# Patient Record
Sex: Male | Born: 1945 | Race: Black or African American | Hispanic: No | Smoking: Former smoker
Health system: Southern US, Community
[De-identification: ages and names within clinical notes are randomized; demographics above are authoritative.]

## PROBLEM LIST (undated history)

## (undated) ENCOUNTER — Emergency Department (HOSPITAL_COMMUNITY): Admission: EM | Payer: Medicare Other | Source: Home / Self Care

## (undated) DIAGNOSIS — Z87891 Personal history of nicotine dependence: Secondary | ICD-10-CM

## (undated) DIAGNOSIS — M109 Gout, unspecified: Secondary | ICD-10-CM

## (undated) DIAGNOSIS — G4733 Obstructive sleep apnea (adult) (pediatric): Secondary | ICD-10-CM

## (undated) DIAGNOSIS — I2510 Atherosclerotic heart disease of native coronary artery without angina pectoris: Secondary | ICD-10-CM

## (undated) DIAGNOSIS — E1129 Type 2 diabetes mellitus with other diabetic kidney complication: Secondary | ICD-10-CM

## (undated) DIAGNOSIS — I10 Essential (primary) hypertension: Secondary | ICD-10-CM

## (undated) DIAGNOSIS — Z9989 Dependence on other enabling machines and devices: Secondary | ICD-10-CM

## (undated) DIAGNOSIS — I5032 Chronic diastolic (congestive) heart failure: Secondary | ICD-10-CM

## (undated) DIAGNOSIS — N183 Chronic kidney disease, stage 3 (moderate): Secondary | ICD-10-CM

## (undated) DIAGNOSIS — D649 Anemia, unspecified: Secondary | ICD-10-CM

## (undated) DIAGNOSIS — I272 Pulmonary hypertension, unspecified: Secondary | ICD-10-CM

## (undated) DIAGNOSIS — K5790 Diverticulosis of intestine, part unspecified, without perforation or abscess without bleeding: Secondary | ICD-10-CM

## (undated) HISTORY — DX: Type 2 diabetes mellitus with other diabetic kidney complication: E11.29

## (undated) HISTORY — DX: Gout, unspecified: M10.9

## (undated) HISTORY — DX: Personal history of nicotine dependence: Z87.891

## (undated) HISTORY — DX: Anemia, unspecified: D64.9

## (undated) HISTORY — DX: Obstructive sleep apnea (adult) (pediatric): G47.33

## (undated) HISTORY — DX: Essential (primary) hypertension: I10

## (undated) HISTORY — DX: Chronic diastolic (congestive) heart failure: I50.32

## (undated) HISTORY — DX: Dependence on other enabling machines and devices: Z99.89

## (undated) HISTORY — PX: CHOLECYSTECTOMY: SHX55

## (undated) HISTORY — DX: Chronic kidney disease, stage 3 (moderate): N18.3

## (undated) HISTORY — DX: Diverticulosis of intestine, part unspecified, without perforation or abscess without bleeding: K57.90

## (undated) HISTORY — DX: Pulmonary hypertension, unspecified: I27.20

---

## 1993-10-08 DIAGNOSIS — E1121 Type 2 diabetes mellitus with diabetic nephropathy: Secondary | ICD-10-CM

## 1993-10-08 DIAGNOSIS — E1129 Type 2 diabetes mellitus with other diabetic kidney complication: Secondary | ICD-10-CM

## 1993-10-08 HISTORY — DX: Type 2 diabetes mellitus with diabetic nephropathy: E11.21

## 2009-06-28 ENCOUNTER — Emergency Department (HOSPITAL_COMMUNITY): Admission: EM | Admit: 2009-06-28 | Discharge: 2009-06-28 | Payer: Self-pay | Admitting: Emergency Medicine

## 2009-07-07 ENCOUNTER — Emergency Department (HOSPITAL_COMMUNITY): Admission: EM | Admit: 2009-07-07 | Discharge: 2009-07-07 | Payer: Self-pay | Admitting: Emergency Medicine

## 2009-10-13 ENCOUNTER — Ambulatory Visit (HOSPITAL_COMMUNITY): Admission: RE | Admit: 2009-10-13 | Discharge: 2009-10-13 | Payer: Self-pay | Admitting: Gastroenterology

## 2010-09-26 LAB — GLUCOSE, CAPILLARY: Glucose-Capillary: 125 mg/dL — ABNORMAL HIGH (ref 70–99)

## 2010-10-08 LAB — BASIC METABOLIC PANEL
BUN: 20 mg/dL (ref 6–23)
CO2: 24 mEq/L (ref 19–32)
Calcium: 8.9 mg/dL (ref 8.4–10.5)
Chloride: 113 mEq/L — ABNORMAL HIGH (ref 96–112)
Creatinine, Ser: 1.79 mg/dL — ABNORMAL HIGH (ref 0.4–1.5)
GFR calc non Af Amer: 38 mL/min — ABNORMAL LOW (ref >60)
Glucose, Bld: 102 mg/dL — ABNORMAL HIGH (ref 70–99)
Potassium: 3.7 mEq/L (ref 3.5–5.1)
Sodium: 142 mEq/L (ref 135–145)

## 2010-10-08 LAB — CBC
HCT: 34.2 % — ABNORMAL LOW (ref 39.0–52.0)
HCT: 36.8 % — ABNORMAL LOW (ref 39.0–52.0)
Hemoglobin: 11.5 g/dL — ABNORMAL LOW (ref 13.0–17.0)
Hemoglobin: 12.2 g/dL — ABNORMAL LOW (ref 13.0–17.0)
MCHC: 33.1 g/dL (ref 30.0–36.0)
MCHC: 33.6 g/dL (ref 30.0–36.0)
MCV: 92.5 fL (ref 78.0–100.0)
MCV: 93.5 fL (ref 78.0–100.0)
Platelets: 188 10*3/uL (ref 150–400)
Platelets: 192 10*3/uL (ref 150–400)
RBC: 3.69 MIL/uL — ABNORMAL LOW (ref 4.22–5.81)
RBC: 3.94 MIL/uL — ABNORMAL LOW (ref 4.22–5.81)
RDW: 12.8 % (ref 11.5–15.5)
RDW: 13.1 % (ref 11.5–15.5)
WBC: 8.3 10*3/uL (ref 4.0–10.5)
WBC: 8.6 10*3/uL (ref 4.0–10.5)

## 2010-10-08 LAB — URINALYSIS, ROUTINE W REFLEX MICROSCOPIC
Bilirubin Urine: NEGATIVE
Bilirubin Urine: NEGATIVE
Glucose, UA: NEGATIVE mg/dL
Glucose, UA: NEGATIVE mg/dL
Hgb urine dipstick: NEGATIVE
Hgb urine dipstick: NEGATIVE
Ketones, ur: NEGATIVE mg/dL
Ketones, ur: NEGATIVE mg/dL
Nitrite: NEGATIVE
Nitrite: NEGATIVE
Protein, ur: NEGATIVE mg/dL
Protein, ur: NEGATIVE mg/dL
Specific Gravity, Urine: 1.017 (ref 1.005–1.030)
Specific Gravity, Urine: 1.021 (ref 1.005–1.030)
Urobilinogen, UA: 0.2 mg/dL (ref 0.0–1.0)
Urobilinogen, UA: 0.2 mg/dL (ref 0.0–1.0)
pH: 5 (ref 5.0–8.0)
pH: 5 (ref 5.0–8.0)

## 2010-10-08 LAB — COMPREHENSIVE METABOLIC PANEL
ALT: 11 U/L (ref 0–53)
AST: 21 U/L (ref 0–37)
Albumin: 4.1 g/dL (ref 3.5–5.2)
Alkaline Phosphatase: 41 U/L (ref 39–117)
BUN: 21 mg/dL (ref 6–23)
CO2: 26 mEq/L (ref 19–32)
Calcium: 9.7 mg/dL (ref 8.4–10.5)
Chloride: 107 mEq/L (ref 96–112)
Creatinine, Ser: 1.59 mg/dL — ABNORMAL HIGH (ref 0.4–1.5)
GFR calc non Af Amer: 44 mL/min — ABNORMAL LOW (ref >60)
Glucose, Bld: 95 mg/dL (ref 70–99)
Potassium: 4.4 mEq/L (ref 3.5–5.1)
Sodium: 139 mEq/L (ref 135–145)
Total Bilirubin: 0.6 mg/dL (ref 0.3–1.2)
Total Protein: 7.8 g/dL (ref 6.0–8.3)

## 2010-10-08 LAB — DIFFERENTIAL
Basophils Absolute: 0 10*3/uL (ref 0.0–0.1)
Basophils Absolute: 0.1 10*3/uL (ref 0.0–0.1)
Basophils Relative: 0 % (ref 0–1)
Basophils Relative: 1 % (ref 0–1)
Eosinophils Absolute: 0.1 10*3/uL (ref 0.0–0.7)
Eosinophils Absolute: 0.3 10*3/uL (ref 0.0–0.7)
Eosinophils Relative: 2 % (ref 0–5)
Eosinophils Relative: 3 % (ref 0–5)
Lymphocytes Relative: 21 % (ref 12–46)
Lymphocytes Relative: 25 % (ref 12–46)
Lymphs Abs: 1.8 10*3/uL (ref 0.7–4.0)
Lymphs Abs: 2 10*3/uL (ref 0.7–4.0)
Monocytes Absolute: 0.8 10*3/uL (ref 0.1–1.0)
Monocytes Absolute: 0.9 10*3/uL (ref 0.1–1.0)
Monocytes Relative: 10 % (ref 3–12)
Monocytes Relative: 10 % (ref 3–12)
Neutro Abs: 5.2 10*3/uL (ref 1.7–7.7)
Neutro Abs: 5.7 10*3/uL (ref 1.7–7.7)
Neutrophils Relative %: 62 % (ref 43–77)
Neutrophils Relative %: 66 % (ref 43–77)

## 2010-10-08 LAB — URINE MICROSCOPIC-ADD ON

## 2010-10-08 LAB — LIPASE, BLOOD: Lipase: 29 U/L (ref 11–59)

## 2010-10-08 LAB — GLUCOSE, CAPILLARY: Glucose-Capillary: 105 mg/dL — ABNORMAL HIGH (ref 70–99)

## 2012-01-26 ENCOUNTER — Encounter (HOSPITAL_COMMUNITY): Payer: Self-pay | Admitting: *Deleted

## 2012-01-26 ENCOUNTER — Emergency Department (HOSPITAL_COMMUNITY)
Admission: EM | Admit: 2012-01-26 | Discharge: 2012-01-26 | Disposition: A | Discharge status: Home / Self Care | Payer: Medicare Other | Attending: Emergency Medicine | Admitting: Emergency Medicine

## 2012-01-26 ENCOUNTER — Emergency Department (HOSPITAL_COMMUNITY): Payer: Medicare Other

## 2012-01-26 DIAGNOSIS — M109 Gout, unspecified: Secondary | ICD-10-CM | POA: Insufficient documentation

## 2012-01-26 DIAGNOSIS — E119 Type 2 diabetes mellitus without complications: Secondary | ICD-10-CM | POA: Insufficient documentation

## 2012-01-26 DIAGNOSIS — I10 Essential (primary) hypertension: Secondary | ICD-10-CM | POA: Insufficient documentation

## 2012-01-26 MED ORDER — PREDNISONE 20 MG PO TABS: 20.0000 mg | ORAL_TABLET | Freq: Every day | ORAL | Status: AC

## 2012-01-26 MED ORDER — PREDNISONE 20 MG PO TABS
40.0000 mg | ORAL_TABLET | Freq: Once | ORAL | Status: AC
  Administered 2012-01-26: 40 mg via ORAL
  Filled 2012-01-26: qty 2

## 2012-01-26 MED ORDER — HYDROCODONE-ACETAMINOPHEN 5-325 MG PO TABS: 1.0000 | ORAL_TABLET | Freq: Four times a day (QID) | ORAL | Status: AC | PRN

## 2012-01-26 MED ORDER — HYDROCODONE-ACETAMINOPHEN 5-325 MG PO TABS
1.0000 | ORAL_TABLET | Freq: Once | ORAL | Status: AC
  Administered 2012-01-26: 1 via ORAL
  Filled 2012-01-26: qty 1

## 2012-01-26 NOTE — ED Provider Notes (Signed)
History     CSN: [id number]  Arrival date & time 01/26/12  1007   First MD Initiated Contact with Patient 01/26/12 1053      Chief Complaint  Patient presents with  . Foot Pain    right foot    (Consider location/radiation/quality/duration/timing/severity/associated sxs/prior treatment) HPI Patient resists emergency department with pain in his right great toe.  Patient, states pain, spreads to the top of his foot.  Patient denies any wounds or injury to the foot.  Patient, states that he had similar pain in the past.  Patient denies fevers, nausea, vomiting, weakness, or numbness.  States he has not tried anything at home for his discomfort.       Past Medical History  Diagnosis Date  . Diabetes mellitus   . Hypertension   . Diverticulitis     Past Surgical History  Procedure Date  . Cholecystectomy     History reviewed. No pertinent family history.  History  Substance Use Topics  . Smoking status: Not on file  . Smokeless tobacco: Not on file  . Alcohol Use: No      Review of Systems All other systems negative except as documented in the HPI. All pertinent positives and negatives as reviewed in the HPI.  Allergies  Review of patient's allergies indicates no known allergies.  Home Medications   Current Outpatient Rx  Name Route Sig Dispense Refill  . CLONIDINE HCL 0.1 MG PO TABS Oral Take 0.1 mg by mouth 2 (two) times daily.    . EPLERENONE 50 MG PO TABS Oral Take 50 mg by mouth daily.    Marland Kitchen GLIPIZIDE 5 MG PO TABS Oral Take 5 mg by mouth daily.    Marland Kitchen LABETALOL HCL 100 MG PO TABS Oral Take 400 mg by mouth 2 (two) times daily.     Marland Kitchen LABETALOL HCL 300 MG PO TABS Oral Take 400 mg by mouth 2 (two) times daily.     Marland Kitchen LISINOPRIL 20 MG PO TABS Oral Take 20 mg by mouth daily.    Marland Kitchen METFORMIN HCL 850 MG PO TABS Oral Take 850 mg by mouth 2 (two) times daily with a meal.    . MINOXIDIL 10 MG PO TABS Oral Take 20 mg by mouth daily.    . TORSEMIDE 100 MG PO TABS Oral  Take 100 mg by mouth 2 (two) times daily.    Marland Kitchen VERAPAMIL HCL ER 360 MG PO CP24 Oral Take 360 mg by mouth daily.      BP 215/88  Pulse 63  Temp 98.6 F (37 C) (Oral)  Resp 14  SpO2 97%  Physical Exam  Constitutional: He appears well-developed and well-nourished. No distress.  Musculoskeletal:       Right foot: He exhibits tenderness and swelling.       Feet:    ED Course  Procedures (including critical care time)  Labs Reviewed - No data to display Dg Foot Complete Right  01/26/2012  *RADIOLOGY REPORT*  Clinical Data: Pain and swelling at the great toe.  RIGHT FOOT COMPLETE - 3+ VIEW  Comparison: None.  Findings: There are slight degenerative changes of the first metatarsal phalangeal joint with slight bunion formation on the head of the first metatarsal.  Minimal arthritic changes of the interphalangeal joints of the toes. Bipartite medial sesamoid.  No bone destruction or fracture.  Arterial vascular calcification is noted at the ankle.  IMPRESSION: No acute abnormalities.  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  Original Report Authenticated By: Larey Seat, M.D.   Will be treated for gout based on his HPI and PE. The patient describes a similar event in the past. Told to follow up with his PCP.   [place], PA-C 01/26/12 1249

## 2012-01-26 NOTE — ED Notes (Signed)
Patient is alert and oriented x3.  He is complaning of right foot pain that started last Wednesday. He states that his current pain is rated 10 of 10 currently that he describes and pins sticking in  His foot.  He is unable to ambulate on the foot

## 2012-01-26 NOTE — ED Notes (Signed)
Dylan [name], [place] notified of pt's BP.  No new orders.

## 2012-01-29 NOTE — ED Provider Notes (Signed)
Medical screening examination/treatment/procedure(s) were performed by non-physician practitioner and as supervising physician I was immediately available for consultation/collaboration.  Varney Biles, MD 01/29/12 1839

## 2012-08-28 ENCOUNTER — Ambulatory Visit (HOSPITAL_COMMUNITY)
Admission: RE | Admit: 2012-08-28 | Discharge: 2012-08-28 | Disposition: A | Discharge status: Home / Self Care | Payer: Medicare Other | Source: Ambulatory Visit | Attending: Nephrology | Admitting: Nephrology

## 2012-08-28 ENCOUNTER — Other Ambulatory Visit (HOSPITAL_COMMUNITY): Payer: Self-pay | Admitting: Nephrology

## 2012-08-28 DIAGNOSIS — R609 Edema, unspecified: Secondary | ICD-10-CM

## 2012-08-28 DIAGNOSIS — Z9089 Acquired absence of other organs: Secondary | ICD-10-CM | POA: Insufficient documentation

## 2012-08-28 DIAGNOSIS — R109 Unspecified abdominal pain: Secondary | ICD-10-CM

## 2013-08-19 DIAGNOSIS — I10 Essential (primary) hypertension: Secondary | ICD-10-CM | POA: Diagnosis not present

## 2013-08-19 DIAGNOSIS — E119 Type 2 diabetes mellitus without complications: Secondary | ICD-10-CM | POA: Diagnosis not present

## 2013-10-11 DIAGNOSIS — R81 Glycosuria: Secondary | ICD-10-CM | POA: Diagnosis not present

## 2013-10-11 DIAGNOSIS — R1032 Left lower quadrant pain: Secondary | ICD-10-CM | POA: Diagnosis not present

## 2013-10-11 DIAGNOSIS — K5730 Diverticulosis of large intestine without perforation or abscess without bleeding: Secondary | ICD-10-CM | POA: Diagnosis not present

## 2013-10-19 DIAGNOSIS — N183 Chronic kidney disease, stage 3 unspecified: Secondary | ICD-10-CM | POA: Diagnosis not present

## 2013-10-20 DIAGNOSIS — Z86010 Personal history of colonic polyps: Secondary | ICD-10-CM | POA: Diagnosis not present

## 2013-10-20 DIAGNOSIS — K5732 Diverticulitis of large intestine without perforation or abscess without bleeding: Secondary | ICD-10-CM | POA: Diagnosis not present

## 2013-11-28 ENCOUNTER — Emergency Department (HOSPITAL_COMMUNITY): Payer: Medicare Other

## 2013-11-28 ENCOUNTER — Emergency Department (HOSPITAL_COMMUNITY)
Admission: EM | Admit: 2013-11-28 | Discharge: 2013-11-28 | Disposition: A | Discharge status: Home / Self Care | Payer: Medicare Other | Attending: Emergency Medicine | Admitting: Emergency Medicine

## 2013-11-28 ENCOUNTER — Encounter (HOSPITAL_COMMUNITY): Payer: Self-pay | Admitting: Emergency Medicine

## 2013-11-28 DIAGNOSIS — E119 Type 2 diabetes mellitus without complications: Secondary | ICD-10-CM | POA: Diagnosis not present

## 2013-11-28 DIAGNOSIS — Z79899 Other long term (current) drug therapy: Secondary | ICD-10-CM | POA: Diagnosis not present

## 2013-11-28 DIAGNOSIS — J40 Bronchitis, not specified as acute or chronic: Secondary | ICD-10-CM | POA: Diagnosis not present

## 2013-11-28 DIAGNOSIS — I10 Essential (primary) hypertension: Secondary | ICD-10-CM | POA: Insufficient documentation

## 2013-11-28 DIAGNOSIS — Z8719 Personal history of other diseases of the digestive system: Secondary | ICD-10-CM | POA: Insufficient documentation

## 2013-11-28 DIAGNOSIS — R0602 Shortness of breath: Secondary | ICD-10-CM | POA: Diagnosis not present

## 2013-11-28 DIAGNOSIS — J209 Acute bronchitis, unspecified: Secondary | ICD-10-CM | POA: Diagnosis not present

## 2013-11-28 LAB — BASIC METABOLIC PANEL
BUN: 18 mg/dL (ref 6–23)
CO2: 23 mEq/L (ref 19–32)
Calcium: 9.2 mg/dL (ref 8.4–10.5)
Chloride: 103 mEq/L (ref 96–112)
Creatinine, Ser: 1.58 mg/dL — ABNORMAL HIGH (ref 0.50–1.35)
GFR calc Af Amer: 50 mL/min — ABNORMAL LOW (ref >90)
GFR calc non Af Amer: 43 mL/min — ABNORMAL LOW (ref >90)
Glucose, Bld: 206 mg/dL — ABNORMAL HIGH (ref 70–99)
Potassium: 4.4 mEq/L (ref 3.7–5.3)
Sodium: 138 mEq/L (ref 137–147)

## 2013-11-28 LAB — CBC
HCT: 35.9 % — ABNORMAL LOW (ref 39.0–52.0)
Hemoglobin: 12.3 g/dL — ABNORMAL LOW (ref 13.0–17.0)
MCH: 31.3 pg (ref 26.0–34.0)
MCHC: 34.3 g/dL (ref 30.0–36.0)
MCV: 91.3 fL (ref 78.0–100.0)
Platelets: 158 10*3/uL (ref 150–400)
RBC: 3.93 MIL/uL — ABNORMAL LOW (ref 4.22–5.81)
RDW: 12.5 % (ref 11.5–15.5)
WBC: 6.8 10*3/uL (ref 4.0–10.5)

## 2013-11-28 LAB — I-STAT TROPONIN, ED: Troponin i, poc: 0 ng/mL (ref 0.00–0.08)

## 2013-11-28 LAB — PRO B NATRIURETIC PEPTIDE: Pro B Natriuretic peptide (BNP): 381.3 pg/mL — ABNORMAL HIGH (ref 0–125)

## 2013-11-28 MED ORDER — GUAIFENESIN-CODEINE 100-10 MG/5ML PO SYRP: 5.0000 mL | ORAL_SOLUTION | Freq: Three times a day (TID) | ORAL | Status: DC | PRN

## 2013-11-28 MED ORDER — ALBUTEROL SULFATE (2.5 MG/3ML) 0.083% IN NEBU
5.0000 mg | INHALATION_SOLUTION | Freq: Once | RESPIRATORY_TRACT | Status: AC
  Administered 2013-11-28: 5 mg via RESPIRATORY_TRACT
  Filled 2013-11-28: qty 6

## 2013-11-28 MED ORDER — ALBUTEROL SULFATE HFA 108 (90 BASE) MCG/ACT IN AERS: 1.0000 | INHALATION_SPRAY | Freq: Four times a day (QID) | RESPIRATORY_TRACT | Status: DC | PRN

## 2013-11-28 MED ORDER — PREDNISONE 20 MG PO TABS
60.0000 mg | ORAL_TABLET | Freq: Once | ORAL | Status: AC
  Administered 2013-11-28: 60 mg via ORAL
  Filled 2013-11-28: qty 3

## 2013-11-28 MED ORDER — PREDNISONE 20 MG PO TABS: 40.0000 mg | ORAL_TABLET | Freq: Every day | ORAL | Status: DC

## 2013-11-28 MED ORDER — IPRATROPIUM BROMIDE 0.02 % IN SOLN
0.5000 mg | Freq: Once | RESPIRATORY_TRACT | Status: AC
  Administered 2013-11-28: 0.5 mg via RESPIRATORY_TRACT
  Filled 2013-11-28: qty 2.5

## 2013-11-28 NOTE — ED Notes (Signed)
Pt reports having sob, "feels like he has fluid on his lungs." reports recent swelling to abd and ankles. Denies any cp or cough.

## 2013-11-28 NOTE — ED Notes (Signed)
Patient transported to X-ray 

## 2013-11-28 NOTE — ED Notes (Signed)
Family at bedside. 

## 2013-11-28 NOTE — ED Provider Notes (Signed)
CSN: [id number]     Arrival date & time 11/28/13  1140 History   First MD Initiated Contact with Patient 11/28/13 1202     Chief Complaint  Patient presents with  . Shortness of Breath     (Consider location/radiation/quality/duration/timing/severity/associated sxs/prior Treatment) HPI  68 year old male with shortness of breath. Gradual onset 3-4 days ago. Symptoms have not steadily improved which is why he came to the emergency room today. Associated with nasal congestion cough. Cough is nonproductive. Denies any pain anywhere. No fevers or chills. Denies any unusual leg pain or swelling. No history of DVT.   Past Medical History  Diagnosis Date  . Diabetes mellitus   . Hypertension   . Diverticulitis    Past Surgical History  Procedure Laterality Date  . Cholecystectomy     History reviewed. No pertinent family history. History  Substance Use Topics  . Smoking status: Not on file  . Smokeless tobacco: Not on file  . Alcohol Use: No    Review of Systems    Allergies  Review of patient's allergies indicates no known allergies.  Home Medications   Prior to Admission medications   Medication Sig Start Date End Date Taking? Authorizing Provider  cloNIDine (CATAPRES) 0.1 MG tablet Take 0.1 mg by mouth 2 (two) times daily.    Historical Provider, MD  eplerenone (INSPRA) 50 MG tablet Take 50 mg by mouth daily.    Historical Provider, MD  glipiZIDE (GLUCOTROL) 5 MG tablet Take 5 mg by mouth daily.    Historical Provider, MD  labetalol (NORMODYNE) 100 MG tablet Take 400 mg by mouth 2 (two) times daily.     Historical Provider, MD  labetalol (NORMODYNE) 300 MG tablet Take 400 mg by mouth 2 (two) times daily.     Historical Provider, MD  lisinopril (PRINIVIL,ZESTRIL) 20 MG tablet Take 20 mg by mouth daily.    Historical Provider, MD  metFORMIN (GLUCOPHAGE) 850 MG tablet Take 850 mg by mouth 2 (two) times daily with a meal.    Historical Provider, MD  minoxidil (LONITEN) 10 MG  tablet Take 20 mg by mouth daily.    Historical Provider, MD  torsemide (DEMADEX) 100 MG tablet Take 100 mg by mouth 2 (two) times daily.    Historical Provider, MD  verapamil (VERELAN PM) 360 MG 24 hr capsule Take 360 mg by mouth daily.    Historical Provider, MD   BP 158/66  Pulse 73  Temp(Src) 98.3 F (36.8 C) (Oral)  Resp 21  Ht _0  (1.676 m)  Wt 207 lb (93.895 kg)  BMI 33.43 kg/m2  SpO2 94% Physical Exam  Nursing note and vitals reviewed. Constitutional: He appears well-developed and well-nourished. No distress.  HENT:  Head: Normocephalic and atraumatic.  Eyes: Conjunctivae are normal. Right eye exhibits no discharge. Left eye exhibits no discharge.  Neck: Neck supple.  Cardiovascular: Normal rate, regular rhythm and normal heart sounds.  Exam reveals no gallop and no friction rub.   No murmur heard. Pulmonary/Chest: Effort normal. No respiratory distress. He has wheezes.  Speaking in complete sentences. No sensory muscle usage. Expiratory wheezing bilaterally.  Abdominal: Soft. He exhibits no distension. There is no tenderness.  Musculoskeletal: He exhibits no edema and no tenderness.  Lower extremities symmetric as compared to each other. No calf tenderness. Negative Homan's. No palpable cords.  Neurological: He is alert.  Skin: Skin is warm and dry.  Psychiatric: He has a normal mood and affect. His behavior is normal. Thought content normal.  ED Course  Procedures (including critical care time) Labs Review Labs Reviewed  CBC - Abnormal; Notable for the following:    RBC 3.93 (*)    Hemoglobin 12.3 (*)    HCT 35.9 (*)    All other components within normal limits  BASIC METABOLIC PANEL - Abnormal; Notable for the following:    Glucose, Bld 206 (*)    Creatinine, Ser 1.58 (*)    GFR calc non Af Amer 43 (*)    GFR calc Af Amer 50 (*)    All other components within normal limits  PRO B NATRIURETIC PEPTIDE - Abnormal; Notable for the following:    Pro B  Natriuretic peptide (BNP) 381.3 (*)    All other components within normal limits  Randolm Idol, ED    Imaging Review Dg Chest 2 View  11/28/2013   CLINICAL DATA:  Shortness of breath.  EXAM: CHEST  2 VIEW  COMPARISON:  None.  FINDINGS: The heart size and mediastinal contours are within normal limits. Both lungs are clear. No pneumothorax or pleural effusion is noted. The visualized skeletal structures are unremarkable.  IMPRESSION: No acute cardiopulmonary abnormality seen.   Electronically Signed   By: Sabino Dick M.D.   On: 11/28/2013 14:09     EKG Interpretation None      MDM   Final diagnoses:  Bronchitis    68 year old male with shortness of breath. Clinically suspect bronchitis. He is afebrile and appears well. He has diffuse wheezing on auscultation. Clinically does not appear to be volume overload. Will treat symptoms and check a chest x-ray. Basic labs. Doubt ACS/anginal equivalent. Doubt PE.  Chest x-ray is clear. Symptoms improved breathing treatment and wheezing is markedly improved as well. Will treat presumptively for bronchitis. Workup and history and exam are not consistent with CHF. Afebrile and chest x-ray with no focal infiltrate. Doubt pulmonary embolism. Atypical for ACS. Return precautions were discussed. Outpatient followup otherwise.    Virgel Manifold, MD 11/28/13 [phone]

## 2013-11-28 NOTE — Discharge Instructions (Signed)

## 2013-12-07 DIAGNOSIS — K5730 Diverticulosis of large intestine without perforation or abscess without bleeding: Secondary | ICD-10-CM | POA: Diagnosis not present

## 2013-12-07 DIAGNOSIS — Z86010 Personal history of colonic polyps: Secondary | ICD-10-CM | POA: Diagnosis not present

## 2013-12-07 DIAGNOSIS — D126 Benign neoplasm of colon, unspecified: Secondary | ICD-10-CM | POA: Diagnosis not present

## 2013-12-07 DIAGNOSIS — Z1211 Encounter for screening for malignant neoplasm of colon: Secondary | ICD-10-CM | POA: Diagnosis not present

## 2013-12-22 DIAGNOSIS — H10439 Chronic follicular conjunctivitis, unspecified eye: Secondary | ICD-10-CM | POA: Diagnosis not present

## 2013-12-29 DIAGNOSIS — E119 Type 2 diabetes mellitus without complications: Secondary | ICD-10-CM | POA: Diagnosis not present

## 2014-01-03 DIAGNOSIS — E119 Type 2 diabetes mellitus without complications: Secondary | ICD-10-CM | POA: Diagnosis not present

## 2014-01-03 DIAGNOSIS — K5732 Diverticulitis of large intestine without perforation or abscess without bleeding: Secondary | ICD-10-CM | POA: Diagnosis not present

## 2014-01-03 DIAGNOSIS — I10 Essential (primary) hypertension: Secondary | ICD-10-CM | POA: Diagnosis not present

## 2014-02-16 DIAGNOSIS — L03019 Cellulitis of unspecified finger: Secondary | ICD-10-CM | POA: Diagnosis not present

## 2014-02-16 DIAGNOSIS — S6000XA Contusion of unspecified finger without damage to nail, initial encounter: Secondary | ICD-10-CM | POA: Diagnosis not present

## 2014-02-16 DIAGNOSIS — I10 Essential (primary) hypertension: Secondary | ICD-10-CM | POA: Diagnosis not present

## 2014-02-16 DIAGNOSIS — E119 Type 2 diabetes mellitus without complications: Secondary | ICD-10-CM | POA: Diagnosis not present

## 2014-02-16 DIAGNOSIS — L02519 Cutaneous abscess of unspecified hand: Secondary | ICD-10-CM | POA: Diagnosis not present

## 2014-03-08 DIAGNOSIS — E119 Type 2 diabetes mellitus without complications: Secondary | ICD-10-CM | POA: Diagnosis not present

## 2014-03-08 DIAGNOSIS — I10 Essential (primary) hypertension: Secondary | ICD-10-CM | POA: Diagnosis not present

## 2014-03-08 DIAGNOSIS — B351 Tinea unguium: Secondary | ICD-10-CM | POA: Diagnosis not present

## 2014-03-28 DIAGNOSIS — Z860100 Personal history of colon polyps, unspecified: Secondary | ICD-10-CM

## 2014-03-28 HISTORY — DX: Personal history of colon polyps, unspecified: Z86.0100

## 2014-03-28 HISTORY — DX: Personal history of colonic polyps: Z86.010

## 2014-03-30 DIAGNOSIS — I10 Essential (primary) hypertension: Secondary | ICD-10-CM | POA: Diagnosis not present

## 2014-03-30 DIAGNOSIS — B351 Tinea unguium: Secondary | ICD-10-CM | POA: Diagnosis not present

## 2014-03-30 DIAGNOSIS — Z23 Encounter for immunization: Secondary | ICD-10-CM | POA: Diagnosis not present

## 2014-03-30 DIAGNOSIS — E119 Type 2 diabetes mellitus without complications: Secondary | ICD-10-CM | POA: Diagnosis not present

## 2014-06-29 DIAGNOSIS — N401 Enlarged prostate with lower urinary tract symptoms: Secondary | ICD-10-CM | POA: Diagnosis not present

## 2014-06-29 DIAGNOSIS — I10 Essential (primary) hypertension: Secondary | ICD-10-CM | POA: Diagnosis not present

## 2014-06-29 DIAGNOSIS — E119 Type 2 diabetes mellitus without complications: Secondary | ICD-10-CM | POA: Diagnosis not present

## 2014-06-29 DIAGNOSIS — R351 Nocturia: Secondary | ICD-10-CM | POA: Diagnosis not present

## 2014-10-07 ENCOUNTER — Encounter (HOSPITAL_COMMUNITY): Payer: Self-pay

## 2014-10-07 ENCOUNTER — Emergency Department (HOSPITAL_COMMUNITY)
Admission: EM | Admit: 2014-10-07 | Discharge: 2014-10-07 | Disposition: A | Discharge status: Home / Self Care | Payer: Medicare Other | Source: Home / Self Care | Attending: Emergency Medicine | Admitting: Emergency Medicine

## 2014-10-07 DIAGNOSIS — R220 Localized swelling, mass and lump, head: Secondary | ICD-10-CM | POA: Diagnosis not present

## 2014-10-07 DIAGNOSIS — N179 Acute kidney failure, unspecified: Secondary | ICD-10-CM | POA: Diagnosis present

## 2014-10-07 DIAGNOSIS — E876 Hypokalemia: Secondary | ICD-10-CM | POA: Diagnosis not present

## 2014-10-07 DIAGNOSIS — L0201 Cutaneous abscess of face: Secondary | ICD-10-CM | POA: Insufficient documentation

## 2014-10-07 DIAGNOSIS — Z794 Long term (current) use of insulin: Secondary | ICD-10-CM

## 2014-10-07 DIAGNOSIS — I808 Phlebitis and thrombophlebitis of other sites: Secondary | ICD-10-CM | POA: Diagnosis present

## 2014-10-07 DIAGNOSIS — J9601 Acute respiratory failure with hypoxia: Secondary | ICD-10-CM | POA: Diagnosis not present

## 2014-10-07 DIAGNOSIS — A46 Erysipelas: Principal | ICD-10-CM | POA: Diagnosis present

## 2014-10-07 DIAGNOSIS — Z7952 Long term (current) use of systemic steroids: Secondary | ICD-10-CM | POA: Insufficient documentation

## 2014-10-07 DIAGNOSIS — I5031 Acute diastolic (congestive) heart failure: Secondary | ICD-10-CM | POA: Diagnosis not present

## 2014-10-07 DIAGNOSIS — M79602 Pain in left arm: Secondary | ICD-10-CM | POA: Diagnosis present

## 2014-10-07 DIAGNOSIS — E119 Type 2 diabetes mellitus without complications: Secondary | ICD-10-CM | POA: Diagnosis present

## 2014-10-07 DIAGNOSIS — B958 Unspecified staphylococcus as the cause of diseases classified elsewhere: Secondary | ICD-10-CM | POA: Diagnosis present

## 2014-10-07 DIAGNOSIS — L0202 Furuncle of face: Secondary | ICD-10-CM | POA: Diagnosis present

## 2014-10-07 DIAGNOSIS — L03211 Cellulitis of face: Secondary | ICD-10-CM | POA: Diagnosis present

## 2014-10-07 DIAGNOSIS — Z79899 Other long term (current) drug therapy: Secondary | ICD-10-CM

## 2014-10-07 DIAGNOSIS — S0181XA Laceration without foreign body of other part of head, initial encounter: Secondary | ICD-10-CM | POA: Diagnosis not present

## 2014-10-07 DIAGNOSIS — I272 Other secondary pulmonary hypertension: Secondary | ICD-10-CM | POA: Diagnosis present

## 2014-10-07 DIAGNOSIS — Z87891 Personal history of nicotine dependence: Secondary | ICD-10-CM

## 2014-10-07 DIAGNOSIS — Z8719 Personal history of other diseases of the digestive system: Secondary | ICD-10-CM

## 2014-10-07 DIAGNOSIS — I10 Essential (primary) hypertension: Secondary | ICD-10-CM

## 2014-10-07 DIAGNOSIS — I129 Hypertensive chronic kidney disease with stage 1 through stage 4 chronic kidney disease, or unspecified chronic kidney disease: Secondary | ICD-10-CM | POA: Diagnosis not present

## 2014-10-07 DIAGNOSIS — N189 Chronic kidney disease, unspecified: Secondary | ICD-10-CM | POA: Diagnosis not present

## 2014-10-07 DIAGNOSIS — R110 Nausea: Secondary | ICD-10-CM | POA: Diagnosis not present

## 2014-10-07 DIAGNOSIS — N183 Chronic kidney disease, stage 3 (moderate): Secondary | ICD-10-CM | POA: Diagnosis present

## 2014-10-07 DIAGNOSIS — R591 Generalized enlarged lymph nodes: Secondary | ICD-10-CM | POA: Diagnosis present

## 2014-10-07 DIAGNOSIS — Z9119 Patient's noncompliance with other medical treatment and regimen: Secondary | ICD-10-CM | POA: Diagnosis present

## 2014-10-07 DIAGNOSIS — I248 Other forms of acute ischemic heart disease: Secondary | ICD-10-CM | POA: Diagnosis present

## 2014-10-07 DIAGNOSIS — G4733 Obstructive sleep apnea (adult) (pediatric): Secondary | ICD-10-CM | POA: Diagnosis present

## 2014-10-07 MED ORDER — LIDOCAINE HCL 2 % IJ SOLN
5.0000 mL | Freq: Once | INTRAMUSCULAR | Status: AC
  Administered 2014-10-07: 100 mg
  Filled 2014-10-07: qty 20

## 2014-10-07 MED ORDER — SULFAMETHOXAZOLE-TRIMETHOPRIM 800-160 MG PO TABS: 1.0000 | ORAL_TABLET | Freq: Two times a day (BID) | ORAL | Status: DC

## 2014-10-07 NOTE — Discharge Instructions (Signed)
Abscess Care After An abscess (also called a boil or furuncle) is an infected area that contains a collection of pus. Signs and symptoms of an abscess include pain, tenderness, redness, or hardness, or you may feel a moveable soft area under your skin. An abscess can occur anywhere in the body. The infection may spread to surrounding tissues causing cellulitis. A cut (incision) by the surgeon was made over your abscess and the pus was drained out. Gauze may have been packed into the space to provide a drain that will allow the cavity to heal from the inside outwards. The boil may be painful for 5 to 7 days. Most people with a boil do not have high fevers. Your abscess, if seen early, may not have localized, and may not have been lanced. If not, another appointment may be required for this if it does not get better on its own or with medications. HOME CARE INSTRUCTIONS   Only take over-the-counter or prescription medicines for pain, discomfort, or fever as directed by your caregiver.  When you bathe, soak and then remove gauze or iodoform packs at least daily or as directed by your caregiver. You may then wash the wound gently with mild soapy water. Repack with gauze or do as your caregiver directs. SEEK IMMEDIATE MEDICAL CARE IF:   You develop increased pain, swelling, redness, drainage, or bleeding in the wound site.  You develop signs of generalized infection including muscle aches, chills, fever, or a general ill feeling.  An oral temperature above 102 F (38.9 C) develops, not controlled by medication. See your caregiver for a recheck if you develop any of the symptoms described above. If medications (antibiotics) were prescribed, take them as directed. Document Released: 01/10/2005 Document Revised: 09/16/2011 Document Reviewed: 09/07/2007 [facility] Patient Information 2015 [place], [place]. This information is not intended to replace advice given to you by your health care provider. Make sure  you discuss any questions you have with your health care provider.

## 2014-10-07 NOTE — ED Notes (Signed)
Pt. Cut himself shaving under his lip last weekend.  Pt. Now has swelling to his lower lip and jaw area.    Pt. Denies any pain or dioscomfort.  The area under his lip has drainage noted.  Pt. Reports if you press on his "pus comes out"

## 2014-10-07 NOTE — ED Provider Notes (Addendum)
CSN: [id number]     Arrival date & time 10/07/14  8381 History   First MD Initiated Contact with Patient 10/07/14 0820     Chief Complaint  Patient presents with  . Facial Swelling     (Consider location/radiation/quality/duration/timing/severity/associated sxs/prior Treatment) Patient is a 69 y.o. male presenting with abscess. The history is provided by the patient.  Abscess Location:  Face Facial abscess location:  Chin Size:  Dime sized Abscess quality: fluctuance, induration, painful and redness   Red streaking: no   Duration:  1 week Progression:  Worsening Pain details:    Quality:  Throbbing   Severity:  Moderate   Timing:  Constant   Progression:  Worsening Chronicity:  New Context: diabetes and skin injury   Context comment:  Cut himself shaving Relieved by:  Nothing Worsened by:  Nothing tried Ineffective treatments:  None tried Associated symptoms: no fever   Associated symptoms comment:  Swelling of the lower lip Risk factors: no prior abscess     Past Medical History  Diagnosis Date  . Diabetes mellitus   . Hypertension   . Diverticulitis    Past Surgical History  Procedure Laterality Date  . Cholecystectomy     No family history on file. History  Substance Use Topics  . Smoking status: Never Smoker   . Smokeless tobacco: Not on file  . Alcohol Use: No    Review of Systems  Constitutional: Negative for fever.  All other systems reviewed and are negative.     Allergies  Review of patient's allergies indicates no known allergies.  Home Medications   Prior to Admission medications   Medication Sig Start Date End Date Taking? Authorizing Provider  albuterol (PROVENTIL HFA;VENTOLIN HFA) 108 (90 BASE) MCG/ACT inhaler Inhale 1-2 puffs into the lungs every 6 (six) hours as needed for wheezing or shortness of breath. 11/28/13   Virgel Manifold, MD  cloNIDine (CATAPRES) 0.1 MG tablet Take 0.1 mg by mouth 2 (two) times daily.    Historical Provider,  MD  colchicine 0.6 MG tablet Take 0.6 mg by mouth daily.    Historical Provider, MD  eplerenone (INSPRA) 50 MG tablet Take 50 mg by mouth daily.    Historical Provider, MD  glipiZIDE (GLUCOTROL) 5 MG tablet Take 5 mg by mouth daily.    Historical Provider, MD  guaiFENesin-codeine (ROBITUSSIN AC) 100-10 MG/5ML syrup Take 5 mLs by mouth 3 (three) times daily as needed for cough. 11/28/13   Virgel Manifold, MD  insulin glargine (LANTUS) 100 UNIT/ML injection Inject 10 Units into the skin at bedtime.    Historical Provider, MD  labetalol (NORMODYNE) 200 MG tablet Take 400 mg by mouth 2 (two) times daily.    Historical Provider, MD  lisinopril (PRINIVIL,ZESTRIL) 20 MG tablet Take 20 mg by mouth daily.    Historical Provider, MD  minoxidil (LONITEN) 10 MG tablet Take 20 mg by mouth daily.    Historical Provider, MD  predniSONE (DELTASONE) 20 MG tablet Take 2 tablets (40 mg total) by mouth daily. 11/28/13   Virgel Manifold, MD  Tetrahydrozoline HCl (EYE DROPS OP) Apply 1 drop to eye 2 (two) times daily.    Historical Provider, MD  torsemide (DEMADEX) 100 MG tablet Take 100 mg by mouth 2 (two) times daily.    Historical Provider, MD  verapamil (VERELAN PM) 360 MG 24 hr capsule Take 360 mg by mouth daily.    Historical Provider, MD   BP 144/61 mmHg  Pulse 79  Temp(Src) 98.9 F (  37.2 C) (Oral)  Resp 20  Ht _0  (1.676 m)  Wt 212 lb (96.163 kg)  BMI 34.23 kg/m2  SpO2 94% Physical Exam  Constitutional: He is oriented to person, place, and time. He appears well-developed and well-nourished. No distress.  HENT:  Head: Normocephalic and atraumatic.    Mouth/Throat: No dental abscesses or dental caries.  Mild swelling of the lower lip  Eyes: EOM are normal. Pupils are equal, round, and reactive to light.  Cardiovascular: Normal rate.   Pulmonary/Chest: Effort normal.  Neurological: He is alert and oriented to person, place, and time.  Skin: Skin is warm and dry.  Psychiatric: He has a normal mood and  affect. His behavior is normal.  Nursing note and vitals reviewed.   ED Course  Procedures (including critical care time) Labs Review Labs Reviewed - No data to display  Imaging Review No results found.  INCISION AND DRAINAGE Performed by: Blanchie Dessert Consent: Verbal consent obtained. Risks and benefits: risks, benefits and alternatives were discussed Type: abscess  Body area: chin  Anesthesia: local infiltration  Incision was made with a scalpel.  Local anesthetic: lidocaine 1% without epinephrine  Anesthetic total: 1 ml  Complexity: complex Blunt dissection to break up loculations  Drainage: purulent  Drainage amount: small amt of pus Packing material:none  Patient tolerance: Patient tolerated the procedure well with no immediate complications.     MDM   Final diagnoses:  Abscess of face    Patient with evidence of abscess on his chin without complicating factors. I&D as above.    Blanchie Dessert, MD 10/07/14 5364  Blanchie Dessert, MD 10/07/14 [phone]

## 2014-10-08 ENCOUNTER — Encounter (HOSPITAL_COMMUNITY): Payer: Self-pay | Admitting: *Deleted

## 2014-10-08 ENCOUNTER — Inpatient Hospital Stay (HOSPITAL_COMMUNITY)
Admission: EM | Admit: 2014-10-08 | Discharge: 2014-10-13 | DRG: 602 | Disposition: A | Discharge status: Home / Self Care | Payer: Medicare Other | Attending: Internal Medicine | Admitting: Internal Medicine

## 2014-10-08 ENCOUNTER — Emergency Department (HOSPITAL_COMMUNITY): Payer: Medicare Other

## 2014-10-08 DIAGNOSIS — I248 Other forms of acute ischemic heart disease: Secondary | ICD-10-CM | POA: Diagnosis present

## 2014-10-08 DIAGNOSIS — L0292 Furuncle, unspecified: Secondary | ICD-10-CM | POA: Diagnosis not present

## 2014-10-08 DIAGNOSIS — E1122 Type 2 diabetes mellitus with diabetic chronic kidney disease: Secondary | ICD-10-CM

## 2014-10-08 DIAGNOSIS — K5790 Diverticulosis of intestine, part unspecified, without perforation or abscess without bleeding: Secondary | ICD-10-CM

## 2014-10-08 DIAGNOSIS — G4733 Obstructive sleep apnea (adult) (pediatric): Secondary | ICD-10-CM | POA: Diagnosis present

## 2014-10-08 DIAGNOSIS — N189 Chronic kidney disease, unspecified: Secondary | ICD-10-CM | POA: Diagnosis not present

## 2014-10-08 DIAGNOSIS — I129 Hypertensive chronic kidney disease with stage 1 through stage 4 chronic kidney disease, or unspecified chronic kidney disease: Secondary | ICD-10-CM | POA: Diagnosis not present

## 2014-10-08 DIAGNOSIS — L0202 Furuncle of face: Secondary | ICD-10-CM | POA: Diagnosis present

## 2014-10-08 DIAGNOSIS — J9600 Acute respiratory failure, unspecified whether with hypoxia or hypercapnia: Secondary | ICD-10-CM

## 2014-10-08 DIAGNOSIS — B958 Unspecified staphylococcus as the cause of diseases classified elsewhere: Secondary | ICD-10-CM | POA: Diagnosis present

## 2014-10-08 DIAGNOSIS — N183 Chronic kidney disease, stage 3 unspecified: Secondary | ICD-10-CM | POA: Diagnosis present

## 2014-10-08 DIAGNOSIS — R609 Edema, unspecified: Secondary | ICD-10-CM

## 2014-10-08 DIAGNOSIS — R591 Generalized enlarged lymph nodes: Secondary | ICD-10-CM | POA: Diagnosis present

## 2014-10-08 DIAGNOSIS — E1129 Type 2 diabetes mellitus with other diabetic kidney complication: Secondary | ICD-10-CM

## 2014-10-08 DIAGNOSIS — I808 Phlebitis and thrombophlebitis of other sites: Secondary | ICD-10-CM | POA: Diagnosis present

## 2014-10-08 DIAGNOSIS — M79602 Pain in left arm: Secondary | ICD-10-CM | POA: Diagnosis present

## 2014-10-08 DIAGNOSIS — A46 Erysipelas: Secondary | ICD-10-CM | POA: Diagnosis not present

## 2014-10-08 DIAGNOSIS — R51 Headache: Secondary | ICD-10-CM | POA: Diagnosis not present

## 2014-10-08 DIAGNOSIS — N179 Acute kidney failure, unspecified: Secondary | ICD-10-CM | POA: Diagnosis present

## 2014-10-08 DIAGNOSIS — J9601 Acute respiratory failure with hypoxia: Secondary | ICD-10-CM | POA: Diagnosis not present

## 2014-10-08 DIAGNOSIS — I517 Cardiomegaly: Secondary | ICD-10-CM | POA: Diagnosis not present

## 2014-10-08 DIAGNOSIS — R220 Localized swelling, mass and lump, head: Secondary | ICD-10-CM | POA: Insufficient documentation

## 2014-10-08 DIAGNOSIS — R030 Elevated blood-pressure reading, without diagnosis of hypertension: Secondary | ICD-10-CM | POA: Diagnosis not present

## 2014-10-08 DIAGNOSIS — Z9119 Patient's noncompliance with other medical treatment and regimen: Secondary | ICD-10-CM | POA: Diagnosis present

## 2014-10-08 DIAGNOSIS — E876 Hypokalemia: Secondary | ICD-10-CM | POA: Diagnosis not present

## 2014-10-08 DIAGNOSIS — R110 Nausea: Secondary | ICD-10-CM | POA: Diagnosis not present

## 2014-10-08 DIAGNOSIS — L03211 Cellulitis of face: Secondary | ICD-10-CM

## 2014-10-08 DIAGNOSIS — Z87891 Personal history of nicotine dependence: Secondary | ICD-10-CM

## 2014-10-08 DIAGNOSIS — I10 Essential (primary) hypertension: Secondary | ICD-10-CM | POA: Diagnosis not present

## 2014-10-08 DIAGNOSIS — R0600 Dyspnea, unspecified: Secondary | ICD-10-CM | POA: Diagnosis not present

## 2014-10-08 DIAGNOSIS — I5031 Acute diastolic (congestive) heart failure: Secondary | ICD-10-CM | POA: Diagnosis not present

## 2014-10-08 DIAGNOSIS — I272 Other secondary pulmonary hypertension: Secondary | ICD-10-CM | POA: Diagnosis present

## 2014-10-08 DIAGNOSIS — E1159 Type 2 diabetes mellitus with other circulatory complications: Secondary | ICD-10-CM

## 2014-10-08 DIAGNOSIS — E1121 Type 2 diabetes mellitus with diabetic nephropathy: Secondary | ICD-10-CM

## 2014-10-08 DIAGNOSIS — Z79899 Other long term (current) drug therapy: Secondary | ICD-10-CM | POA: Diagnosis not present

## 2014-10-08 DIAGNOSIS — Z794 Long term (current) use of insulin: Secondary | ICD-10-CM | POA: Diagnosis not present

## 2014-10-08 DIAGNOSIS — E119 Type 2 diabetes mellitus without complications: Secondary | ICD-10-CM | POA: Diagnosis present

## 2014-10-08 DIAGNOSIS — I1A0 Resistant hypertension: Secondary | ICD-10-CM

## 2014-10-08 DIAGNOSIS — S0181XA Laceration without foreign body of other part of head, initial encounter: Secondary | ICD-10-CM | POA: Diagnosis not present

## 2014-10-08 DIAGNOSIS — R519 Headache, unspecified: Secondary | ICD-10-CM

## 2014-10-08 HISTORY — DX: Obstructive sleep apnea (adult) (pediatric): G47.33

## 2014-10-08 HISTORY — DX: Diverticulosis of intestine, part unspecified, without perforation or abscess without bleeding: K57.90

## 2014-10-08 HISTORY — DX: Personal history of nicotine dependence: Z87.891

## 2014-10-08 LAB — BASIC METABOLIC PANEL
Anion gap: 8 (ref 5–15)
BUN: 27 mg/dL — ABNORMAL HIGH (ref 6–23)
CO2: 26 mmol/L (ref 19–32)
Calcium: 9.1 mg/dL (ref 8.4–10.5)
Chloride: 104 mmol/L (ref 96–112)
Creatinine, Ser: 2.48 mg/dL — ABNORMAL HIGH (ref 0.50–1.35)
GFR calc Af Amer: 29 mL/min — ABNORMAL LOW (ref >90)
GFR calc non Af Amer: 25 mL/min — ABNORMAL LOW (ref >90)
Glucose, Bld: 148 mg/dL — ABNORMAL HIGH (ref 70–99)
Potassium: 4.1 mmol/L (ref 3.5–5.1)
Sodium: 138 mmol/L (ref 135–145)

## 2014-10-08 LAB — GLUCOSE, CAPILLARY
Glucose-Capillary: 183 mg/dL — ABNORMAL HIGH (ref 70–99)
Glucose-Capillary: 122 mg/dL — ABNORMAL HIGH (ref 70–99)
Glucose-Capillary: 148 mg/dL — ABNORMAL HIGH (ref 70–99)

## 2014-10-08 LAB — CBC WITH DIFFERENTIAL/PLATELET
Basophils Absolute: 0 10*3/uL (ref 0.0–0.1)
Basophils Relative: 0 % (ref 0–1)
Eosinophils Absolute: 0.3 10*3/uL (ref 0.0–0.7)
Eosinophils Relative: 2 % (ref 0–5)
HCT: 36.1 % — ABNORMAL LOW (ref 39.0–52.0)
Hemoglobin: 11.8 g/dL — ABNORMAL LOW (ref 13.0–17.0)
Lymphocytes Relative: 20 % (ref 12–46)
Lymphs Abs: 2.9 10*3/uL (ref 0.7–4.0)
MCH: 30.6 pg (ref 26.0–34.0)
MCHC: 32.7 g/dL (ref 30.0–36.0)
MCV: 93.5 fL (ref 78.0–100.0)
Monocytes Absolute: 2.1 10*3/uL — ABNORMAL HIGH (ref 0.1–1.0)
Monocytes Relative: 14 % — ABNORMAL HIGH (ref 3–12)
Neutro Abs: 9.4 10*3/uL — ABNORMAL HIGH (ref 1.7–7.7)
Neutrophils Relative %: 64 % (ref 43–77)
Platelets: 167 10*3/uL (ref 150–400)
RBC: 3.86 MIL/uL — ABNORMAL LOW (ref 4.22–5.81)
RDW: 13.1 % (ref 11.5–15.5)
WBC: 14.7 10*3/uL — ABNORMAL HIGH (ref 4.0–10.5)

## 2014-10-08 LAB — I-STAT CHEM 8, ED
BUN: 30 mg/dL — ABNORMAL HIGH (ref 6–23)
Calcium, Ion: 1.14 mmol/L (ref 1.13–1.30)
Chloride: 104 mmol/L (ref 96–112)
Creatinine, Ser: 2.4 mg/dL — ABNORMAL HIGH (ref 0.50–1.35)
Glucose, Bld: 148 mg/dL — ABNORMAL HIGH (ref 70–99)
HCT: 39 % (ref 39.0–52.0)
Hemoglobin: 13.3 g/dL (ref 13.0–17.0)
Potassium: 4.1 mmol/L (ref 3.5–5.1)
Sodium: 141 mmol/L (ref 135–145)
TCO2: 21 mmol/L (ref 0–100)

## 2014-10-08 LAB — CBG MONITORING, ED
Glucose-Capillary: 139 mg/dL — ABNORMAL HIGH (ref 70–99)
Glucose-Capillary: 187 mg/dL — ABNORMAL HIGH (ref 70–99)

## 2014-10-08 MED ORDER — INSULIN ASPART 100 UNIT/ML ~~LOC~~ SOLN
0.0000 [IU] | Freq: Every day | SUBCUTANEOUS | Status: DC
  Administered 2014-10-11: 3 [IU] via SUBCUTANEOUS

## 2014-10-08 MED ORDER — BISACODYL 10 MG RE SUPP: 10.0000 mg | Freq: Every day | RECTAL | Status: DC | PRN

## 2014-10-08 MED ORDER — ALBUTEROL SULFATE (2.5 MG/3ML) 0.083% IN NEBU: 3.0000 mL | INHALATION_SOLUTION | Freq: Four times a day (QID) | RESPIRATORY_TRACT | Status: DC | PRN

## 2014-10-08 MED ORDER — OXYCODONE HCL 5 MG PO TABS
5.0000 mg | ORAL_TABLET | ORAL | Status: DC | PRN
  Administered 2014-10-08: 5 mg via ORAL
  Filled 2014-10-08 (×2): qty 1

## 2014-10-08 MED ORDER — SODIUM CHLORIDE 0.9 % IV BOLUS (SEPSIS)
500.0000 mL | Freq: Once | INTRAVENOUS | Status: AC
  Administered 2014-10-08: 500 mL via INTRAVENOUS

## 2014-10-08 MED ORDER — PIPERACILLIN-TAZOBACTAM 3.375 G IVPB 30 MIN
3.3750 g | Freq: Once | INTRAVENOUS | Status: AC
  Administered 2014-10-08: 3.375 g via INTRAVENOUS
  Filled 2014-10-08: qty 50

## 2014-10-08 MED ORDER — PIPERACILLIN-TAZOBACTAM 3.375 G IVPB
3.3750 g | Freq: Three times a day (TID) | INTRAVENOUS | Status: DC
  Administered 2014-10-08 – 2014-10-09 (×2): 3.375 g via INTRAVENOUS
  Filled 2014-10-08 (×4): qty 50

## 2014-10-08 MED ORDER — GLIPIZIDE 5 MG PO TABS
5.0000 mg | ORAL_TABLET | Freq: Every day | ORAL | Status: DC
  Administered 2014-10-08 – 2014-10-09 (×2): 5 mg via ORAL
  Filled 2014-10-08 (×3): qty 1

## 2014-10-08 MED ORDER — VERAPAMIL HCL ER 240 MG PO TBCR
360.0000 mg | EXTENDED_RELEASE_TABLET | Freq: Every day | ORAL | Status: DC
  Administered 2014-10-08 – 2014-10-13 (×6): 360 mg via ORAL
  Filled 2014-10-08 (×6): qty 1.5

## 2014-10-08 MED ORDER — ACETAMINOPHEN 650 MG RE SUPP: 650.0000 mg | Freq: Four times a day (QID) | RECTAL | Status: DC | PRN

## 2014-10-08 MED ORDER — CLINDAMYCIN PHOSPHATE 600 MG/50ML IV SOLN
600.0000 mg | Freq: Once | INTRAVENOUS | Status: AC
  Administered 2014-10-08: 600 mg via INTRAVENOUS
  Filled 2014-10-08: qty 50

## 2014-10-08 MED ORDER — VANCOMYCIN HCL 10 G IV SOLR
1250.0000 mg | INTRAVENOUS | Status: DC
  Administered 2014-10-09: 1250 mg via INTRAVENOUS
  Filled 2014-10-08 (×2): qty 1250

## 2014-10-08 MED ORDER — INSULIN ASPART 100 UNIT/ML ~~LOC~~ SOLN
0.0000 [IU] | Freq: Three times a day (TID) | SUBCUTANEOUS | Status: DC
  Administered 2014-10-08: 3 [IU] via SUBCUTANEOUS
  Administered 2014-10-08 – 2014-10-09 (×2): 2 [IU] via SUBCUTANEOUS
  Administered 2014-10-10 – 2014-10-11 (×2): 3 [IU] via SUBCUTANEOUS
  Administered 2014-10-11 (×2): 2 [IU] via SUBCUTANEOUS
  Administered 2014-10-12 (×2): 3 [IU] via SUBCUTANEOUS
  Administered 2014-10-12: 2 [IU] via SUBCUTANEOUS
  Administered 2014-10-13: 3 [IU] via SUBCUTANEOUS

## 2014-10-08 MED ORDER — ACETAMINOPHEN 325 MG PO TABS
650.0000 mg | ORAL_TABLET | Freq: Four times a day (QID) | ORAL | Status: DC | PRN
  Administered 2014-10-10: 650 mg via ORAL
  Filled 2014-10-08: qty 2

## 2014-10-08 MED ORDER — DOCUSATE SODIUM 100 MG PO CAPS
100.0000 mg | ORAL_CAPSULE | Freq: Two times a day (BID) | ORAL | Status: DC
  Administered 2014-10-08 – 2014-10-12 (×8): 100 mg via ORAL
  Filled 2014-10-08 (×11): qty 1

## 2014-10-08 MED ORDER — ALUM & MAG HYDROXIDE-SIMETH 200-200-20 MG/5ML PO SUSP: 30.0000 mL | Freq: Four times a day (QID) | ORAL | Status: DC | PRN

## 2014-10-08 MED ORDER — SODIUM CHLORIDE 0.9 % IV SOLN
INTRAVENOUS | Status: DC
  Administered 2014-10-08 – 2014-10-10 (×5): via INTRAVENOUS

## 2014-10-08 MED ORDER — ONDANSETRON HCL 4 MG PO TABS: 4.0000 mg | ORAL_TABLET | Freq: Four times a day (QID) | ORAL | Status: DC | PRN

## 2014-10-08 MED ORDER — TETRAHYDROZOLINE HCL 0.05 % OP SOLN
2.0000 [drp] | Freq: Two times a day (BID) | OPHTHALMIC | Status: DC
  Administered 2014-10-08 – 2014-10-13 (×11): 2 [drp] via OPHTHALMIC
  Filled 2014-10-08 (×2): qty 15

## 2014-10-08 MED ORDER — FENTANYL CITRATE 0.05 MG/ML IJ SOLN
50.0000 ug | Freq: Once | INTRAMUSCULAR | Status: AC
  Administered 2014-10-08: 50 ug via INTRAVENOUS
  Filled 2014-10-08: qty 2

## 2014-10-08 MED ORDER — VANCOMYCIN HCL IN DEXTROSE 1-5 GM/200ML-% IV SOLN
1000.0000 mg | Freq: Once | INTRAVENOUS | Status: AC
  Administered 2014-10-08: 1000 mg via INTRAVENOUS
  Filled 2014-10-08: qty 200

## 2014-10-08 MED ORDER — MORPHINE SULFATE 2 MG/ML IJ SOLN: 1.0000 mg | INTRAMUSCULAR | Status: DC | PRN

## 2014-10-08 MED ORDER — HEPARIN SODIUM (PORCINE) 5000 UNIT/ML IJ SOLN
5000.0000 [IU] | Freq: Three times a day (TID) | INTRAMUSCULAR | Status: DC
  Administered 2014-10-08 – 2014-10-13 (×16): 5000 [IU] via SUBCUTANEOUS
  Filled 2014-10-08 (×13): qty 1

## 2014-10-08 MED ORDER — MAGNESIUM CITRATE PO SOLN: 1.0000 | Freq: Once | ORAL | Status: AC | PRN

## 2014-10-08 MED ORDER — INSULIN GLARGINE 100 UNIT/ML ~~LOC~~ SOLN
20.0000 [IU] | Freq: Every day | SUBCUTANEOUS | Status: DC
  Administered 2014-10-08 – 2014-10-12 (×5): 20 [IU] via SUBCUTANEOUS
  Filled 2014-10-08 (×6): qty 0.2

## 2014-10-08 MED ORDER — ONDANSETRON HCL 4 MG/2ML IJ SOLN: 4.0000 mg | Freq: Four times a day (QID) | INTRAMUSCULAR | Status: DC | PRN

## 2014-10-08 MED ORDER — CLONIDINE HCL 0.1 MG PO TABS
0.1000 mg | ORAL_TABLET | Freq: Three times a day (TID) | ORAL | Status: DC
  Administered 2014-10-08 – 2014-10-10 (×7): 0.1 mg via ORAL
  Filled 2014-10-08 (×8): qty 1

## 2014-10-08 MED ORDER — VERAPAMIL HCL ER 360 MG PO CP24: 360.0000 mg | ORAL_CAPSULE | Freq: Every day | ORAL | Status: DC

## 2014-10-08 MED ORDER — MAGNESIUM HYDROXIDE 400 MG/5ML PO SUSP: 30.0000 mL | Freq: Every day | ORAL | Status: DC | PRN

## 2014-10-08 MED ORDER — ONDANSETRON HCL 4 MG/2ML IJ SOLN
4.0000 mg | Freq: Once | INTRAMUSCULAR | Status: AC
  Administered 2014-10-08: 4 mg via INTRAVENOUS
  Filled 2014-10-08: qty 2

## 2014-10-08 MED ORDER — LABETALOL HCL 100 MG PO TABS
400.0000 mg | ORAL_TABLET | Freq: Two times a day (BID) | ORAL | Status: DC
  Administered 2014-10-08 – 2014-10-13 (×10): 400 mg via ORAL
  Filled 2014-10-08 (×10): qty 4

## 2014-10-08 NOTE — ED Notes (Signed)
Attempted report. Number left

## 2014-10-08 NOTE — ED Notes (Addendum)
Pt c/o lip swelling related to an abscess on face x2 weeks. States that he was seen yesterday and started on antibiotics but the swelling has gotten worse. States the wound was lanced and drained yesterday. No difficulties with airway.

## 2014-10-08 NOTE — Progress Notes (Signed)
Patient has sleep apnea, and refusing a CPAP.  I put 2L per nasal canula on him while he is sleeping.

## 2014-10-08 NOTE — Progress Notes (Signed)
ANTIBIOTIC CONSULT NOTE - INITIAL  Pharmacy Consult for Vancomycin and Zosyn Indication: facial cellulitis  No Known Allergies  Patient Measurements: Height: _0  (167.6 cm) Weight: 212 lb (96.163 kg) IBW/kg (Calculated) : 63.8  Vital Signs: Temp: 98.4 F (36.9 C) (04/02 1054) Temp Source: Oral (04/02 1054) BP: 151/60 mmHg (04/02 1054) Pulse Rate: 77 (04/02 1054) Intake/Output from previous day:   Intake/Output from this shift:    Labs:  Recent Labs  10/08/14 0520 10/08/14 0525  WBC 14.7*  --   HGB 11.8* 13.3  PLT 167  --   CREATININE 2.48* 2.40*   Estimated Creatinine Clearance: 31.1 mL/min (by C-G formula based on Cr of 2.4). No results for input(s): VANCOTROUGH, VANCOPEAK, VANCORANDOM, GENTTROUGH, GENTPEAK, GENTRANDOM, TOBRATROUGH, TOBRAPEAK, TOBRARND, AMIKACINPEAK, AMIKACINTROU, AMIKACIN in the last 72 hours.   Microbiology: No results found for this or any previous visit (from the past 720 hour(s)).  Medical History: Past Medical History  Diagnosis Date  . Diabetes mellitus   . Hypertension   . Diverticulitis     Medications:  Prescriptions prior to admission  Medication Sig Dispense Refill Last Dose  . albuterol (PROVENTIL HFA;VENTOLIN HFA) 108 (90 BASE) MCG/ACT inhaler Inhale 1-2 puffs into the lungs every 6 (six) hours as needed for wheezing or shortness of breath. 1 Inhaler 0 Past Month at Unknown time  . cloNIDine (CATAPRES) 0.1 MG tablet Take 0.1 mg by mouth 3 (three) times daily.    10/07/2014 at 8p  . colchicine 0.6 MG tablet Take 0.6 mg by mouth daily as needed.    Past Month at Unknown time  . eplerenone (INSPRA) 50 MG tablet Take 50 mg by mouth daily.   10/07/2014 at Unknown time  . glipiZIDE (GLUCOTROL) 5 MG tablet Take 5 mg by mouth daily.   10/07/2014 at Unknown time  . guaiFENesin-codeine (ROBITUSSIN AC) 100-10 MG/5ML syrup Take 5 mLs by mouth 3 (three) times daily as needed for cough. 120 mL 0 Past Month at Unknown time  . insulin glargine  (LANTUS) 100 UNIT/ML injection Inject 20 Units into the skin at bedtime.    10/07/2014 at Unknown time  . labetalol (NORMODYNE) 200 MG tablet Take 400 mg by mouth 2 (two) times daily.   10/07/2014 at 6  . minoxidil (LONITEN) 10 MG tablet Take 20 mg by mouth daily.   10/07/2014 at Unknown time  . sulfamethoxazole-trimethoprim (SEPTRA DS) 800-160 MG per tablet Take 1 tablet by mouth every 12 (twelve) hours. 10 tablet 0 10/07/2014 at Unknown time  . Tetrahydrozoline HCl (EYE DROPS OP) Apply 1 drop to eye 2 (two) times daily.   Past Week at Unknown time  . torsemide (DEMADEX) 10 MG tablet Take 10 mg by mouth 2 (two) times daily.   Past Month at Unknown time  . verapamil (VERELAN PM) 360 MG 24 hr capsule Take 360 mg by mouth daily.   10/07/2014 at Unknown time  . lisinopril (PRINIVIL,ZESTRIL) 20 MG tablet Take 20 mg by mouth daily.   Yesterday? at Unknown time  . predniSONE (DELTASONE) 20 MG tablet Take 2 tablets (40 mg total) by mouth daily. 10 tablet 0   . torsemide (DEMADEX) 100 MG tablet Take 100 mg by mouth 2 (two) times daily.   Yesterday? at Unknown time   Assessment: 69 y.o. male presents with facial swelling. S/p I&D in ED on 4/1 and discharge home on po Bactrim. Lower facial swelling increased today so he came back to ED. Pt received Clinda 618m IV ~0500, Zosyn  3.375gm and Vanc 1gm ~1100. To continue Zosyn and Vancomycin for facial celluliltis. 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 SCr 2.4, est CrCl 30 ml/min. Tm 99.6. WBC elevated to 14.7. Bld cx pending.  Goal of Therapy:  Vancomycin trough level 10-15 mcg/ml  Plan:  Vancomycin 1294m IV q24h Zosyn 3.375gm IV q8h - each dose over 4 hours Will f/u renal function, micro data, pt's clinical condition Vanc trough prn  CSherlon Handing PharmD, BCPS Clinical pharmacist, pager [phone]/08/2014,11:20 AM

## 2014-10-08 NOTE — ED Notes (Signed)
Admitting at the bedside.  

## 2014-10-08 NOTE — H&P (Signed)
[facility] History and Physical                                                                                    Dylan [name], is a 69 y.o. male  MRN: [id number]   DOB - 02/01/1946  Admit Date - 10/08/2014  Outpatient Primary MD for the patient is Dylan Frames, MD  With History of -  Past Medical History  Diagnosis Date  . Diabetes mellitus   . Hypertension   . Diverticulitis       Past Surgical History  Procedure Laterality Date  . Cholecystectomy      in for   Chief Complaint  Patient presents with  . Facial Swelling     HPI 69 year old male patient with past medical history diabetes on insulin, hypertension, sleep apnea, former tobacco abuse, and diverticulosis. Patient reports a one-week history of discomfort and a small bump under his bottom lip. While shaving he make this area and later noticed increased swelling and discomfort. He presented to the ER on 4/1 where IND was accomplished and the patient was discharged on Bactrim. She reports he has taken a total of 3 doses of Bactrim since discharge from the ER. He returned to the ER early in the morning hours of 4/2 with increased lower facial swelling with significant edema and the bottom lip. He was not experiencing any stridor or airway compromise.  In the ER he was normotensive and mildly febrile with a temperature of 99.6. His room air saturations were 94%. White count was 14,700 with normal neutrophils. Patient's BUN and creatinine were elevated at 30/2.40. Baseline renal function is 18/1.58. Maxillofacial CT without contrast was completed. This revealed facial soft tissue swelling most consistent with cellulitis but this was limited assessment for abscess given noncontrast CT. No subcutaneous gas was seen. Also documented was borderline suspected lymphadenopathy in the cervical neck; incompletely imaged due to noncontrasted study but likely reactive in nature given above process. Patient's current primary  complaint is of having a sore lip. Regarding his glycemic control he reports about a week ago he noticed his CBGs had climbed into the greater than 200 range but over the past 24 hours had decreased to the 150 range  Review of Systems   In addition to the HPI above,  No Fever-chills, myalgias or other constitutional symptoms No Headache, changes with Vision or hearing, new weakness, tingling, numbness in any extremity, No problems swallowing food or Liquids, indigestion/reflux No Chest pain, Cough or Shortness of Breath, palpitations, orthopnea or DOE No Abdominal pain, N/V; no melena or hematochezia, no dark tarry stools, Bowel movements are regular, No dysuria, hematuria or flank pain No new skin rashes, lesions, masses or bruises, No new joints pains-aches No recent weight gain or loss No polyuria, polydypsia or polyphagia,  *A full 10 point Review of Systems was done, except as stated above, all other Review of Systems were negative.  Social History History  Substance Use Topics  . Smoking status: Never Smoker   . Smokeless tobacco: Not on file  . Alcohol Use: No    Family History No family history on file.  Prior to Admission medications  Medication Sig Start Date End Date Taking? Authorizing Provider  albuterol (PROVENTIL HFA;VENTOLIN HFA) 108 (90 BASE) MCG/ACT inhaler Inhale 1-2 puffs into the lungs every 6 (six) hours as needed for wheezing or shortness of breath. 11/28/13  Yes Virgel Manifold, MD  cloNIDine (CATAPRES) 0.1 MG tablet Take 0.1 mg by mouth 3 (three) times daily.    Yes Historical Provider, MD  colchicine 0.6 MG tablet Take 0.6 mg by mouth daily as needed.    Yes Historical Provider, MD  eplerenone (INSPRA) 50 MG tablet Take 50 mg by mouth daily.   Yes Historical Provider, MD  glipiZIDE (GLUCOTROL) 5 MG tablet Take 5 mg by mouth daily.   Yes Historical Provider, MD  guaiFENesin-codeine (ROBITUSSIN AC) 100-10 MG/5ML syrup Take 5 mLs by mouth 3 (three) times daily  as needed for cough. 11/28/13  Yes Virgel Manifold, MD  insulin glargine (LANTUS) 100 UNIT/ML injection Inject 20 Units into the skin at bedtime.    Yes Historical Provider, MD  labetalol (NORMODYNE) 200 MG tablet Take 400 mg by mouth 2 (two) times daily.   Yes Historical Provider, MD  minoxidil (LONITEN) 10 MG tablet Take 20 mg by mouth daily.   Yes Historical Provider, MD  sulfamethoxazole-trimethoprim (SEPTRA DS) 800-160 MG per tablet Take 1 tablet by mouth every 12 (twelve) hours. 10/07/14  Yes Blanchie Dessert, MD  Tetrahydrozoline HCl (EYE DROPS OP) Apply 1 drop to eye 2 (two) times daily.   Yes Historical Provider, MD  torsemide (DEMADEX) 10 MG tablet Take 10 mg by mouth 2 (two) times daily.   Yes Historical Provider, MD  verapamil (VERELAN PM) 360 MG 24 hr capsule Take 360 mg by mouth daily.   Yes Historical Provider, MD  lisinopril (PRINIVIL,ZESTRIL) 20 MG tablet Take 20 mg by mouth daily.    Historical Provider, MD  predniSONE (DELTASONE) 20 MG tablet Take 2 tablets (40 mg total) by mouth daily. 11/28/13   Virgel Manifold, MD  torsemide (DEMADEX) 100 MG tablet Take 100 mg by mouth 2 (two) times daily.    Historical Provider, MD    No Known Allergies  Physical Exam  Vitals  Blood pressure 148/64, pulse 66, temperature 99.6 F (37.6 C), temperature source Oral, resp. rate 18, height _0  (1.676 m), weight 212 lb (96.163 kg), SpO2 93 %.   General:  In no acute distress, appears healthy and well nourished  Psych:  Normal affect, Denies Suicidal or Homicidal ideations, Awake Alert, Oriented X 3. Speech and thought patterns are clear and appropriate, no apparent short term memory deficits  Neuro:   No focal neurological deficits, CN II through XII intact, Strength 5/5 all 4 extremities, Sensation intact all 4 extremities.  ENT:  Ears and Eyes appear Normal, Conjunctivae clear, clear mildly injected PER. Moist oral mucosa without erythema or exudates. Patient noted with significant edema  bottom lip as well as lower facial swelling that extends to just above the top lip bilaterally  Neck:  Supple, bilateral cervical boggy and tender lymphadenopathy right greater than left  Respiratory:  Symmetrical chest wall movement, Good air movement bilaterally, CTAB. Room Air  Cardiac:  RRR, No Murmurs, no LE edema noted, no JVD, No carotid bruits, peripheral pulses palpable at 2+  Abdomen:  Positive bowel sounds, Soft, Non tender, Non distended,  No masses appreciated, no obvious hepatosplenomegaly  Skin:  No Cyanosis, Normal Skin Turgor, No Skin Rash or Bruise. Patient has small complex maculopapular lesion below bottom lip which has some purulent drainage noted on  the surface  Extremities: Symmetrical without obvious trauma or injury,  no effusions.  Data Review  CBC  Recent Labs Lab 10/08/14 0520 10/08/14 0525  WBC 14.7*  --   HGB 11.8* 13.3  HCT 36.1* 39.0  PLT 167  --   MCV 93.5  --   MCH 30.6  --   MCHC 32.7  --   RDW 13.1  --   LYMPHSABS 2.9  --   MONOABS 2.1*  --   EOSABS 0.3  --   BASOSABS 0.0  --     Chemistries   Recent Labs Lab 10/08/14 0520 10/08/14 0525  NA 138 141  K 4.1 4.1  CL 104 104  CO2 26  --   GLUCOSE 148* 148*  BUN 27* 30*  CREATININE 2.48* 2.40*  CALCIUM 9.1  --     estimated creatinine clearance is 31.1 mL/min (by C-G formula based on Cr of 2.4).  No results for input(s): TSH, T4TOTAL, T3FREE, THYROIDAB in the last 72 hours.  Invalid input(s): FREET3  Coagulation profile No results for input(s): INR, PROTIME in the last 168 hours.  No results for input(s): DDIMER in the last 72 hours.  Cardiac Enzymes No results for input(s): CKMB, TROPONINI, MYOGLOBIN in the last 168 hours.  Invalid input(s): CK  Invalid input(s): POCBNP  Urinalysis    Component Value Date/Time   COLORURINE YELLOW 07/07/2009 1254   APPEARANCEUR CLEAR 07/07/2009 1254   LABSPEC 1.017 07/07/2009 1254   PHURINE 5.0 07/07/2009 1254   GLUCOSEU  NEGATIVE 07/07/2009 1254   HGBUR NEGATIVE 07/07/2009 1254   BILIRUBINUR NEGATIVE 07/07/2009 1254   KETONESUR NEGATIVE 07/07/2009 1254   PROTEINUR NEGATIVE 07/07/2009 1254   UROBILINOGEN 0.2 07/07/2009 1254   NITRITE NEGATIVE 07/07/2009 1254   LEUKOCYTESUR TRACE* 07/07/2009 1254    Imaging results:   Ct Maxillofacial Wo Cm  10/08/2014   CLINICAL DATA:  Cut self while shaving 5 days ago with persistent lower live swelling. History of diabetes.  EXAM: CT MAXILLOFACIAL WITHOUT CONTRAST  TECHNIQUE: Multidetector CT imaging of the maxillofacial structures was performed. Multiplanar CT image reconstructions were also generated. A small metallic BB was placed on the right temple in order to reliably differentiate right from left.  COMPARISON:  None.  FINDINGS: RIGHT greater LEFT mandible soft tissue swelling, reticulated fat without focal fluid collections on this noncontrast study, no subcutaneous gas or radiopaque foreign bodies.  Mandible is intact, the condyles are located, no facial fracture.  Small RIGHT posterior maxillary sinus mucosal retention cyst with trace paranasal sinus mucosal thickening, no air-fluid levels. Nasal septum is midline. No destructive bony lesions.  Remote RIGHT medial orbital blowout fracture. Mild proptosis bilaterally. Preservation of retrobulbar fat. Extraocular muscles are located, unremarkable. Normal appearance optic nerve sheath complexes.  Borderline suspected lymphadenopathy incompletely imaged is likely reactive.  IMPRESSION: Facial soft tissue swelling most consistent with cellulitis, limited assessment for abscess on noncontrast CT. No subcutaneous gas.  No acute facial fracture.  Borderline partially imaged cervical lymphadenopathy is likely reactive.   Electronically Signed   By: Elon Alas   On: 10/08/2014 06:20     Assessment & Plan  Principal Problem:   Erysipelas -Admit to floor -Empiric broad-spectrum antibiotics -Supportive care with  analgesics  Active Problems:   Acute renal failure superimposed on stage 3 chronic kidney disease -Likely related to concomitant use of Bactrim in setting of chronic use of ACE inhibitor with diuretics -Hold ACE inhibitor and diuretics and maximize blood pressure readings avoiding hypotension -  IV fluid hydration -Follow labs -Hold colchicine    Diabetes mellitus, type 2 -Continue home Lantus and glyburide -Check CBGs and provide sliding scale insulin -Check hemoglobin A1c    HTN -Holding diuretics and ACE inhibitor as above -Unclear why patient on Demadex and Inspra-pharmacy clarifying medications and dosages at time of my evaluation -We'll also hold Loniten at this juncture since can precipitate significant drops in blood pressure quickly    OSA on CPAP -Patient states does not use at home -Have counseled regarding importance of utilizing CPAP to prevent further medical problems such as heart failure, daytime sleepiness, etc -Have ordered CPAP for use at hour of sleep during this admission    Nonadherence to medical therapies -Seems at this juncture precipitated by patient's poor financial state; patient reports unable to return to primary care physician's office because he is the money. -Case management consultation for assistance -Gen. medication review performed by myself and I have noticed 2 separate diuretics as well as 2 different doses on one of the diuretics in a patient without systolic dysfunction, significant renal impairment or diastolic dysfunction so I question whether he needs to be on these medications -Pharmacist is also documented discrepancies in when patient had medications last refilled and patient stating he just took the medicine 24 hours prior to arrival to the hospital -Please note prednisone 40 mg daily was listed in the medication reconciliation but patient does not carry any diagnoses that would require chronic prednisone nor prednisone at such a high-dose  sepsis suspect this was left over from previous admission and was actually part of a tapering course of prednisone-documented pharmacist in process of clarifying home medications    Former tobacco use -Patient reports quit 4 years prior    Diverticulosis -Currently asymptomatic    DVT Prophylaxis: Subcutaneous heparin  Family Communication:  No family at bedside   Code Status:  Full code  Condition:  Stable  Time spent in minutes : 60   Jinx Gilden L. ANP on 10/08/2014 at 8:53 AM  Between 7am to 7pm - Pager - [phone]  After 7pm go to [email or web address] - password [email or web address]  And look for the night coverage person covering me after hours  [facility]

## 2014-10-08 NOTE — ED Notes (Signed)
Patient transported to CT 

## 2014-10-08 NOTE — ED Provider Notes (Signed)
TIME SEEN: 5:05 AM  CHIEF COMPLAINT: Lower lip swelling, subjective fever  HPI: Pt is a 69 y.o. male with history of insulin-dependent diabetes, hypertension who presents emergency department with facial swelling, subjective fevers. Was seen in the emergency department on 10/07/14 and had an abscess underneath the lip incised and drained. Was placed on Bactrim. Reports taking 3 doses of Bactrim has had significantly increased swelling to the lower lip and face. No difficulty swallowing, speaking. No swelling underneath his chin. No vomiting or diarrhea.  ROS: See HPI Constitutional: no fever  Eyes: no drainage  ENT: no runny nose   Cardiovascular:  no chest pain  Resp: no SOB  GI: no vomiting GU: no dysuria Integumentary: no rash  Allergy: no hives  Musculoskeletal: no leg swelling  Neurological: no slurred speech ROS otherwise negative  PAST MEDICAL HISTORY/PAST SURGICAL HISTORY:  Past Medical History  Diagnosis Date  . Diabetes mellitus   . Hypertension   . Diverticulitis     MEDICATIONS:  Prior to Admission medications   Medication Sig Start Date End Date Taking? Authorizing Provider  albuterol (PROVENTIL HFA;VENTOLIN HFA) 108 (90 BASE) MCG/ACT inhaler Inhale 1-2 puffs into the lungs every 6 (six) hours as needed for wheezing or shortness of breath. 11/28/13   Virgel Manifold, MD  cloNIDine (CATAPRES) 0.1 MG tablet Take 0.1 mg by mouth 2 (two) times daily.    Historical Provider, MD  colchicine 0.6 MG tablet Take 0.6 mg by mouth daily.    Historical Provider, MD  eplerenone (INSPRA) 50 MG tablet Take 50 mg by mouth daily.    Historical Provider, MD  glipiZIDE (GLUCOTROL) 5 MG tablet Take 5 mg by mouth daily.    Historical Provider, MD  guaiFENesin-codeine (ROBITUSSIN AC) 100-10 MG/5ML syrup Take 5 mLs by mouth 3 (three) times daily as needed for cough. 11/28/13   Virgel Manifold, MD  insulin glargine (LANTUS) 100 UNIT/ML injection Inject 10 Units into the skin at bedtime.     Historical Provider, MD  labetalol (NORMODYNE) 200 MG tablet Take 400 mg by mouth 2 (two) times daily.    Historical Provider, MD  lisinopril (PRINIVIL,ZESTRIL) 20 MG tablet Take 20 mg by mouth daily.    Historical Provider, MD  minoxidil (LONITEN) 10 MG tablet Take 20 mg by mouth daily.    Historical Provider, MD  predniSONE (DELTASONE) 20 MG tablet Take 2 tablets (40 mg total) by mouth daily. 11/28/13   Virgel Manifold, MD  sulfamethoxazole-trimethoprim (SEPTRA DS) 800-160 MG per tablet Take 1 tablet by mouth every 12 (twelve) hours. 10/07/14   Blanchie Dessert, MD  Tetrahydrozoline HCl (EYE DROPS OP) Apply 1 drop to eye 2 (two) times daily.    Historical Provider, MD  torsemide (DEMADEX) 100 MG tablet Take 100 mg by mouth 2 (two) times daily.    Historical Provider, MD  verapamil (VERELAN PM) 360 MG 24 hr capsule Take 360 mg by mouth daily.    Historical Provider, MD    ALLERGIES:  No Known Allergies  SOCIAL HISTORY:  History  Substance Use Topics  . Smoking status: Never Smoker   . Smokeless tobacco: Not on file  . Alcohol Use: No    FAMILY HISTORY: No family history on file.  EXAM: BP 143/54 mmHg  Pulse 63  Temp(Src) 99.6 F (37.6 C) (Oral)  Resp 18  Ht _0  (1.676 m)  Wt 212 lb (96.163 kg)  BMI 34.23 kg/m2  SpO2 98% CONSTITUTIONAL: Alert and oriented and responds appropriately to questions. Well-appearing;  well-nourished HEAD: Normocephalic EYES: Conjunctivae clear, PERRL ENT: normal nose; no rhinorrhea; moist mucous membranes; pharynx without lesions noted, no sign of dental injury or abscess, patient has significant swelling of his lower lip, no sign of Ludwig angina, no trismus or drooling, normal phonation, tongue sits flat in the bottom of the mouth, there is bilateral anterior cervical lymphadenopathy, patient has some swelling erythema and induration surrounding the mouth and of the chin, there is a 1 cm lesion underneath the lower lip that is dried crusted without  drainage and no fluctuance NECK: Supple, no meningismus, anterior cervical lymphadenopathy CARD: RRR; S1 and S2 appreciated; no murmurs, no clicks, no rubs, no gallops RESP: Normal chest excursion without splinting or tachypnea; breath sounds clear and equal bilaterally; no wheezes, no rhonchi, no rales ABD/GI: Normal bowel sounds; non-distended; soft, non-tender, no rebound, no guarding BACK:  The back appears normal and is non-tender to palpation, there is no CVA tenderness EXT: Normal ROM in all joints; non-tender to palpation; no edema; normal capillary refill; no cyanosis    SKIN: Normal color for age and race; warm NEURO: Moves all extremities equally PSYCH: The patient's mood and manner are appropriate. Grooming and personal hygiene are appropriate.  MEDICAL DECISION MAKING: Patient here with what appears to be facial cellulitis. Will obtain labs, CT scan of his face to evaluate for extension of abscess. We'll give pain medication and IV antibiotics.  ED PROGRESS: Labs show leukocytosis.  Pt oral temp 99.6.  Hemodynamically stable. CT shows no abscess, but extensive facial cellulitis.  Will admit given IDDM and significant rapid swelling to face.  Given Clindamycin. PCP was Kristie Cowman, pt hasn't seen in 6 months.  IM resident Dr. Gordy Levan states pt assigned to [facility].  D/w Erin Hearing with [facility] for admission.  She will place holding orders.     [place], DO 10/08/14 [phone]

## 2014-10-08 NOTE — Progress Notes (Signed)
Patient refuses CPAP at this time. He stated he does not wear one at home. He is aware to call the RT if he wants it at any time during the night.

## 2014-10-09 DIAGNOSIS — N179 Acute kidney failure, unspecified: Secondary | ICD-10-CM | POA: Insufficient documentation

## 2014-10-09 DIAGNOSIS — L03211 Cellulitis of face: Secondary | ICD-10-CM

## 2014-10-09 DIAGNOSIS — I10 Essential (primary) hypertension: Secondary | ICD-10-CM

## 2014-10-09 DIAGNOSIS — I129 Hypertensive chronic kidney disease with stage 1 through stage 4 chronic kidney disease, or unspecified chronic kidney disease: Secondary | ICD-10-CM

## 2014-10-09 DIAGNOSIS — E1159 Type 2 diabetes mellitus with other circulatory complications: Secondary | ICD-10-CM

## 2014-10-09 DIAGNOSIS — E1129 Type 2 diabetes mellitus with other diabetic kidney complication: Secondary | ICD-10-CM

## 2014-10-09 DIAGNOSIS — I1A0 Resistant hypertension: Secondary | ICD-10-CM

## 2014-10-09 DIAGNOSIS — L0292 Furuncle, unspecified: Secondary | ICD-10-CM

## 2014-10-09 DIAGNOSIS — N189 Chronic kidney disease, unspecified: Secondary | ICD-10-CM | POA: Insufficient documentation

## 2014-10-09 DIAGNOSIS — E1122 Type 2 diabetes mellitus with diabetic chronic kidney disease: Secondary | ICD-10-CM

## 2014-10-09 HISTORY — DX: Resistant hypertension: I1A.0

## 2014-10-09 HISTORY — DX: Essential (primary) hypertension: I10

## 2014-10-09 HISTORY — DX: Type 2 diabetes mellitus with other diabetic kidney complication: E11.29

## 2014-10-09 LAB — CBC
HCT: 32.5 % — ABNORMAL LOW (ref 39.0–52.0)
Hemoglobin: 10.5 g/dL — ABNORMAL LOW (ref 13.0–17.0)
MCH: 30.3 pg (ref 26.0–34.0)
MCHC: 32.3 g/dL (ref 30.0–36.0)
MCV: 93.7 fL (ref 78.0–100.0)
Platelets: 147 10*3/uL — ABNORMAL LOW (ref 150–400)
RBC: 3.47 MIL/uL — ABNORMAL LOW (ref 4.22–5.81)
RDW: 12.8 % (ref 11.5–15.5)
WBC: 11.4 10*3/uL — ABNORMAL HIGH (ref 4.0–10.5)

## 2014-10-09 LAB — BASIC METABOLIC PANEL
Anion gap: 8 (ref 5–15)
BUN: 29 mg/dL — ABNORMAL HIGH (ref 6–23)
CO2: 24 mmol/L (ref 19–32)
Calcium: 8.2 mg/dL — ABNORMAL LOW (ref 8.4–10.5)
Chloride: 108 mmol/L (ref 96–112)
Creatinine, Ser: 2.09 mg/dL — ABNORMAL HIGH (ref 0.50–1.35)
GFR calc Af Amer: 35 mL/min — ABNORMAL LOW (ref >90)
GFR calc non Af Amer: 30 mL/min — ABNORMAL LOW (ref >90)
Glucose, Bld: 115 mg/dL — ABNORMAL HIGH (ref 70–99)
Potassium: 3.7 mmol/L (ref 3.5–5.1)
Sodium: 140 mmol/L (ref 135–145)

## 2014-10-09 LAB — GLUCOSE, CAPILLARY
Glucose-Capillary: 101 mg/dL — ABNORMAL HIGH (ref 70–99)
Glucose-Capillary: 122 mg/dL — ABNORMAL HIGH (ref 70–99)
Glucose-Capillary: 108 mg/dL — ABNORMAL HIGH (ref 70–99)
Glucose-Capillary: 153 mg/dL — ABNORMAL HIGH (ref 70–99)
Glucose-Capillary: 136 mg/dL — ABNORMAL HIGH (ref 70–99)

## 2014-10-09 LAB — HIV ANTIBODY (ROUTINE TESTING W REFLEX): HIV Screen 4th Generation wRfx: NONREACTIVE

## 2014-10-09 NOTE — Progress Notes (Signed)
PROGRESS NOTE  Dylan [name] VDI:[id number] DOB: [birth year] DOA: Oct 30, 2014 PCP: Andria Frames, MD  Brief history 69 year old male with history of diabetes mellitus, hypertension, obstructive sleep apnea, and CKD stage III presents with one-week history of increased swelling and pain from a lesion underneath his lower lip. The patient shaved over a "bump" underneath his lower lip about 1 week prior to admission. Approximately 2 days later, he noted increasing pain and swelling. He went to the emergency department on 10/07/2014 and was placed on Bactrim DS, 1 tablet twice a day. There was no improvement. As a result who presented back to the emergency room on October 30, 2014. The patient denied any fevers, chills, neck swelling, dysphagia, headaches. He states that he has had  of at least 3 facial boils and infections over the past 2-3 years.  they would improve with antibiotic therapy without any surgery.   Assessment/Plan: facial cellulitis/furunculosis -likely a staphylococcal infection -d/c zosyn -continue vanco IV -CT maxillofacial negative for any abscess or subcutaneous air  -No problems with chewing or swallowing at this time  acute on chronic renal failure (CKD stage III) -Baseline creatinine 1.5-1.8 -Multifactorial including volume depletion in the setting of torsemide, Bactrim, and Inspra use -d/c bactrim and Inspra -Bactrim inhibits tubular secretion of creatinine and can Mildly elevated serum creatinine  -Discontinue colchicine diabetes mellitus type 2 -Hemoglobin A1c -Hold glipizide -NovoLog sliding scale  Hypertension -Monitor off of minoxidil, Inspra and torsemide -Continue clonidine and verapamil -Presently controlled History of tobacco use -15-pack-year history -Quit 40 years ago OSA -need outpatient polysomnogram -Supplemental oxygen while sleeping   Family Communication:   Pt at beside Disposition Plan:   Home in 2 days if  stable      Procedures/Studies: Ct Maxillofacial Wo Cm  2014/10/30   CLINICAL DATA:  Cut self while shaving 5 days ago with persistent lower live swelling. History of diabetes.  EXAM: CT MAXILLOFACIAL WITHOUT CONTRAST  TECHNIQUE: Multidetector CT imaging of the maxillofacial structures was performed. Multiplanar CT image reconstructions were also generated. A small metallic BB was placed on the right temple in order to reliably differentiate right from left.  COMPARISON:  None.  FINDINGS: RIGHT greater LEFT mandible soft tissue swelling, reticulated fat without focal fluid collections on this noncontrast study, no subcutaneous gas or radiopaque foreign bodies.  Mandible is intact, the condyles are located, no facial fracture.  Small RIGHT posterior maxillary sinus mucosal retention cyst with trace paranasal sinus mucosal thickening, no air-fluid levels. Nasal septum is midline. No destructive bony lesions.  Remote RIGHT medial orbital blowout fracture. Mild proptosis bilaterally. Preservation of retrobulbar fat. Extraocular muscles are located, unremarkable. Normal appearance optic nerve sheath complexes.  Borderline suspected lymphadenopathy incompletely imaged is likely reactive.  IMPRESSION: Facial soft tissue swelling most consistent with cellulitis, limited assessment for abscess on noncontrast CT. No subcutaneous gas.  No acute facial fracture.  Borderline partially imaged cervical lymphadenopathy is likely reactive.   Electronically Signed   By: Elon Alas   On: 10-30-2014 06:20        Subjective: Patient states that facial swelling is better, but he still has pain. Denies any fevers, chills, chest pain, shortness breath, nausea, vomiting or diarrhea, vomiting, dysuria, hematuria. No dysphagia.  Objective: Filed Vitals:   10/30/2014 1054 10/30/2014 1437 10/30/2014 2126 10/09/14 0540  BP: 151/60 138/60 130/53 135/62  Pulse: 77 71 64 57  Temp: 98.4 F (36.9 C) 98.4 F (36.9 C) 99.4 F  (37.4  C) 98.9 F (37.2 C)  TempSrc: Oral Oral Oral Oral  Resp: _0 Height: _1  (1.676 m)     Weight: 96.163 kg (212 lb)     SpO2: 98% 91% 95% 96%    Intake/Output Summary (Last 24 hours) at 10/09/14 0957 Last data filed at 10/09/14 0900  Gross per 24 hour  Intake 2561.67 ml  Output      0 ml  Net 2561.67 ml   Weight change: 0 kg (0 lb) Exam:   General:  Pt is alert, follows commands appropriately, not in acute distress  HEENT: No icterus, No thrush,  [place]/AT; approximately 2 cm furuncle below the lower lip with surrounding erythema and induration. No crepitance. No necrosis. No intraoral lesions or tongue lesions.  Cardiovascular: RRR, S1/S2, no rubs, no gallops  Respiratory: Diminished breath sounds at bases. No wheezing. Good air movement.  Abdomen: Soft/+BS, non tender, non distended, no guarding  Extremities: No edema, No lymphangitis, No petechiae, No rashes, no synovitis  Data Reviewed: Basic Metabolic Panel:  Recent Labs Lab 10/08/14 0520 10/08/14 0525 10/09/14 0606  NA 138 141 140  K 4.1 4.1 3.7  CL 104 104 108  CO2 26  --  24  GLUCOSE 148* 148* 115*  BUN 27* 30* 29*  CREATININE 2.48* 2.40* 2.09*  CALCIUM 9.1  --  8.2*   Liver Function Tests: No results for input(s): AST, ALT, ALKPHOS, BILITOT, PROT, ALBUMIN in the last 168 hours. No results for input(s): LIPASE, AMYLASE in the last 168 hours. No results for input(s): AMMONIA in the last 168 hours. CBC:  Recent Labs Lab 10/08/14 0520 10/08/14 0525 10/09/14 0606  WBC 14.7*  --  11.4*  NEUTROABS 9.4*  --   --   HGB 11.8* 13.3 10.5*  HCT 36.1* 39.0 32.5*  MCV 93.5  --  93.7  PLT 167  --  147*   Cardiac Enzymes: No results for input(s): CKTOTAL, CKMB, CKMBINDEX, TROPONINI in the last 168 hours. BNP: Invalid input(s): POCBNP CBG:  Recent Labs Lab 10/08/14 0920 10/08/14 1205 10/08/14 1655 10/08/14 2128 10/09/14 0734  GLUCAP 187* 183* 122* 148* 101*    Recent Results (from  the past 240 hour(s))  Culture, blood (routine x 2)     Status: None (Preliminary result)   Collection Time: 10/08/14 11:21 AM  Result Value Ref Range Status   Specimen Description RIGHT ANTECUBITAL  Final   Special Requests BOTTLES DRAWN AEROBIC AND ANAEROBIC [place]  Final   Culture   Final           BLOOD CULTURE RECEIVED NO GROWTH TO DATE CULTURE WILL BE HELD FOR 5 DAYS BEFORE ISSUING A FINAL NEGATIVE REPORT Performed at [facility]    Report Status PENDING  Incomplete  Culture, blood (routine x 2)     Status: None (Preliminary result)   Collection Time: 10/08/14 11:37 AM  Result Value Ref Range Status   Specimen Description BLOOD RIGHT ARM  Final   Special Requests BOTTLES DRAWN AEROBIC ONLY 4CC  Final   Culture   Final           BLOOD CULTURE RECEIVED NO GROWTH TO DATE CULTURE WILL BE HELD FOR 5 DAYS BEFORE ISSUING A FINAL NEGATIVE REPORT Performed at [facility]    Report Status PENDING  Incomplete     Scheduled Meds: . cloNIDine  0.1 mg Oral TID  . docusate sodium  100 mg Oral BID  . glipiZIDE  5  mg Oral Daily  . heparin  5,000 Units Subcutaneous 3 times per day  . insulin aspart  0-15 Units Subcutaneous TID WC  . insulin aspart  0-5 Units Subcutaneous QHS  . insulin glargine  20 Units Subcutaneous QHS  . labetalol  400 mg Oral BID  . piperacillin-tazobactam (ZOSYN)  IV  3.375 g Intravenous 3 times per day  . tetrahydrozoline  2 drop Both Eyes BID  . vancomycin  1,250 mg Intravenous Q24H  . verapamil  360 mg Oral Daily   Continuous Infusions: . sodium chloride 100 mL/hr at 10/09/14 0846     Jarrel Knoke, DO  [facility] Pager [phone]  If 7PM-7AM, please contact night-coverage [email or web address] Password [email or web address] 10/09/2014, 9:57 AM   LOS: 1 day \

## 2014-10-09 NOTE — Care Management Note (Signed)
    Page 1 of 1   10/09/2014     3:07:02 PM CARE MANAGEMENT NOTE 10/09/2014  Patient:  Senske,Red   Account Number:  [id number]  Date Initiated:  10/08/2014  Documentation initiated by:  [facility]  Subjective/Objective Assessment:   adm: cellulitis     Action/Plan:   discharge planning   Anticipated DC Date:  10/11/2014   Anticipated DC Plan:  [place]  CM consult  PCP issues      Choice offered to / List presented to:             Status of service:  Completed, signed off Medicare Important Message given?   (If response is "NO", the following Medicare IM given date fields will be blank) Date Medicare IM given:   Medicare IM given by:   Date Additional Medicare IM given:   Additional Medicare IM given by:    Discharge Disposition:  HOME/SELF CARE  Per UR Regulation:    If discussed at Long Length of Stay Meetings, dates discussed:    Comments:  10/08/14 (LATE ENTRY): CM received request to provide pt resource for securing a PCP.  CM placed [place] [phone] on discharge with instructions to call num er to secure a PCP.  No other CM needs were communicated.  Mariane Masters, BSN, CM [phone].

## 2014-10-09 NOTE — Progress Notes (Signed)
Rt Note:  Pt refuses cpap at this time.  Rt will continue to monitor.

## 2014-10-10 ENCOUNTER — Inpatient Hospital Stay (HOSPITAL_COMMUNITY): Payer: Medicare Other

## 2014-10-10 DIAGNOSIS — J9601 Acute respiratory failure with hypoxia: Secondary | ICD-10-CM

## 2014-10-10 DIAGNOSIS — J9600 Acute respiratory failure, unspecified whether with hypoxia or hypercapnia: Secondary | ICD-10-CM

## 2014-10-10 LAB — TROPONIN I
Troponin I: 0.06 ng/mL — ABNORMAL HIGH (ref <0.031)
Troponin I: 0.06 ng/mL — ABNORMAL HIGH (ref <0.031)

## 2014-10-10 LAB — BASIC METABOLIC PANEL
Anion gap: 7 (ref 5–15)
BUN: 20 mg/dL (ref 6–23)
CO2: 23 mmol/L (ref 19–32)
Calcium: 8.5 mg/dL (ref 8.4–10.5)
Chloride: 109 mmol/L (ref 96–112)
Creatinine, Ser: 1.53 mg/dL — ABNORMAL HIGH (ref 0.50–1.35)
GFR calc Af Amer: 51 mL/min — ABNORMAL LOW (ref >90)
GFR calc non Af Amer: 44 mL/min — ABNORMAL LOW (ref >90)
Glucose, Bld: 107 mg/dL — ABNORMAL HIGH (ref 70–99)
Potassium: 3.6 mmol/L (ref 3.5–5.1)
Sodium: 139 mmol/L (ref 135–145)

## 2014-10-10 LAB — HEMOGLOBIN A1C
Hgb A1c MFr Bld: 7.4 % — ABNORMAL HIGH (ref 4.8–5.6)
Mean Plasma Glucose: 166 mg/dL

## 2014-10-10 LAB — VITAMIN B12: Vitamin B-12: 445 pg/mL (ref 211–911)

## 2014-10-10 LAB — CBC
HCT: 33.8 % — ABNORMAL LOW (ref 39.0–52.0)
Hemoglobin: 11.1 g/dL — ABNORMAL LOW (ref 13.0–17.0)
MCH: 30.3 pg (ref 26.0–34.0)
MCHC: 32.8 g/dL (ref 30.0–36.0)
MCV: 92.3 fL (ref 78.0–100.0)
Platelets: 149 10*3/uL — ABNORMAL LOW (ref 150–400)
RBC: 3.66 MIL/uL — ABNORMAL LOW (ref 4.22–5.81)
RDW: 12.7 % (ref 11.5–15.5)
WBC: 11.3 10*3/uL — ABNORMAL HIGH (ref 4.0–10.5)

## 2014-10-10 LAB — GLUCOSE, CAPILLARY
Glucose-Capillary: 100 mg/dL — ABNORMAL HIGH (ref 70–99)
Glucose-Capillary: 95 mg/dL (ref 70–99)
Glucose-Capillary: 151 mg/dL — ABNORMAL HIGH (ref 70–99)
Glucose-Capillary: 129 mg/dL — ABNORMAL HIGH (ref 70–99)

## 2014-10-10 MED ORDER — VANCOMYCIN HCL 10 G IV SOLR
1500.0000 mg | INTRAVENOUS | Status: DC
  Administered 2014-10-10 – 2014-10-13 (×4): 1500 mg via INTRAVENOUS
  Filled 2014-10-10 (×4): qty 1500

## 2014-10-10 MED ORDER — CLONIDINE HCL 0.2 MG PO TABS
0.2000 mg | ORAL_TABLET | Freq: Three times a day (TID) | ORAL | Status: DC
  Administered 2014-10-10 – 2014-10-13 (×9): 0.2 mg via ORAL
  Filled 2014-10-10 (×8): qty 1

## 2014-10-10 MED ORDER — ASPIRIN 325 MG PO TABS
325.0000 mg | ORAL_TABLET | Freq: Every day | ORAL | Status: DC
  Administered 2014-10-10 – 2014-10-11 (×2): 325 mg via ORAL
  Filled 2014-10-10 (×2): qty 1

## 2014-10-10 MED ORDER — FUROSEMIDE 10 MG/ML IJ SOLN
40.0000 mg | Freq: Once | INTRAMUSCULAR | Status: AC
  Administered 2014-10-10: 40 mg via INTRAVENOUS
  Filled 2014-10-10: qty 4

## 2014-10-10 NOTE — Progress Notes (Signed)
ANTIBIOTIC CONSULT NOTE - Follow-up  Pharmacy Consult for Vancomycin Indication: facial cellulitis  No Known Allergies  Patient Measurements: Height: _0  (167.6 cm) Weight: 212 lb (96.163 kg) IBW/kg (Calculated) : 63.8  Vital Signs: Temp: 99.1 F (37.3 C) (04/04 0454) Temp Source: Oral (04/04 0454) BP: 182/70 mmHg (04/04 0454) Pulse Rate: 66 (04/04 0454) Intake/Output from previous day: 04/03 0701 - 04/04 0700 In: 3413.3 [P.O.:840; I.V.:2323.3; IV Piggyback:250] Out: -  Intake/Output from this shift:    Labs:  Recent Labs  10/08/14 0520 10/08/14 0525 10/09/14 0606 10/10/14 0416  WBC 14.7*  --  11.4* 11.3*  HGB 11.8* 13.3 10.5* 11.1*  PLT 167  --  147* 149*  CREATININE 2.48* 2.40* 2.09* 1.53*   Estimated Creatinine Clearance: 48.8 mL/min (by C-G formula based on Cr of 1.53). No results for input(s): VANCOTROUGH, VANCOPEAK, VANCORANDOM, GENTTROUGH, GENTPEAK, GENTRANDOM, TOBRATROUGH, TOBRAPEAK, TOBRARND, AMIKACINPEAK, AMIKACINTROU, AMIKACIN in the last 72 hours.   Microbiology: Recent Results (from the past 720 hour(s))  Culture, blood (routine x 2)     Status: None (Preliminary result)   Collection Time: 10/08/14 11:21 AM  Result Value Ref Range Status   Specimen Description RIGHT ANTECUBITAL  Final   Special Requests BOTTLES DRAWN AEROBIC AND ANAEROBIC [place]  Final   Culture   Final           BLOOD CULTURE RECEIVED NO GROWTH TO DATE CULTURE WILL BE HELD FOR 5 DAYS BEFORE ISSUING A FINAL NEGATIVE REPORT Performed at [facility]    Report Status PENDING  Incomplete  Culture, blood (routine x 2)     Status: None (Preliminary result)   Collection Time: 10/08/14 11:37 AM  Result Value Ref Range Status   Specimen Description BLOOD RIGHT ARM  Final   Special Requests BOTTLES DRAWN AEROBIC ONLY 4CC  Final   Culture   Final           BLOOD CULTURE RECEIVED NO GROWTH TO DATE CULTURE WILL BE HELD FOR 5 DAYS BEFORE ISSUING A FINAL NEGATIVE REPORT Performed at  [facility]    Report Status PENDING  Incomplete    Assessment: 69 y.o. male continues on Vancomycin (Day #3) for facial swelling. /p I&D 4/1 in ED. CT neg for abscess or SQ air. Tm 99.1. WBC down to 11.3.  Pt with acute on CKD stage III (baseline SCr 1.5-1.8). SCr back to baseline 1.53, est CrCl 48 ml/min  Goal of Therapy:  Vancomycin trough level 10-15 mcg/ml  Plan:  Change Vancomycin to 1539m IV q24h Will f/u renal function, micro data, pt's clinical condition Vanc trough at Css if continues  CSherlon Handing PharmD, BCPS Clinical pharmacist, pager [phone]/10/2014,8:44 AM

## 2014-10-10 NOTE — Progress Notes (Signed)
PROGRESS NOTE  Dylan [name] TWS:[id number] DOB: 1946/03/27 DOA: October 16, 2014 PCP: Andria Frames, MD  Brief history 69 year old male with history of diabetes mellitus, hypertension, obstructive sleep apnea, and CKD stage III presents with one-week history of increased swelling and pain from a lesion underneath his lower lip. The patient shaved over a "bump" underneath his lower lip about 1 week prior to admission. Approximately 2 days later, he noted increasing pain and swelling. He went to the emergency department on 10/07/2014 and was placed on Bactrim DS, 1 tablet twice a day. There was no improvement. As a result who presented back to the emergency room on 10-16-2014. The patient denied any fevers, chills, neck swelling, dysphagia, headaches. He states that he has had of at least 3 facial boils and infections over the past 2-3 years. they would improve with antibiotic therapy without any surgery.   Assessment/Plan: facial cellulitis/furunculosis -likely a staphylococcal infection -clinically improving -d/c zosyn -continue vanco IV -CT maxillofacial negative for any abscess or subcutaneous air  -No problems with chewing or swallowing at this time  acute on chronic renal failure (CKD stage III) -Baseline creatinine 1.5-1.8 -improved with IVF -Multifactorial including volume depletion in the setting of torsemide, Bactrim, and Inspra use -d/c bactrim and Inspra -Bactrim inhibits tubular secretion of creatinine and can Mildly elevated serum creatinine  -Discontinue colchicine Dyspnea/Hypoxemia/Acute respiratory failure -Chest x-ray -Echocardiogram -EKG -Supplemental oxygen -[place] lock IVF Atypical chest pain -cycle troponin -EKG -ASA diabetes mellitus type 2 -Hemoglobin A1c--pending -Hold glipizide -NovoLog sliding scale Hypertension--uncontrolled -Monitor off of minoxidil, Inspra and torsemide -Continue clonidine and verapamil -10/10/14--pt refused am meds--discussed  with pt risk/benefits/alternatives--now he agrees to take the meds History of tobacco use -15-pack-year history -Quit 40 years ago OSA -need outpatient polysomnogram -Supplemental oxygen while sleeping   Family Communication: Pt at beside Disposition Plan: Home when stable    Procedures/Studies: Ct Maxillofacial Wo Cm  October 16, 2014   CLINICAL DATA:  Cut self while shaving 5 days ago with persistent lower live swelling. History of diabetes.  EXAM: CT MAXILLOFACIAL WITHOUT CONTRAST  TECHNIQUE: Multidetector CT imaging of the maxillofacial structures was performed. Multiplanar CT image reconstructions were also generated. A small metallic BB was placed on the right temple in order to reliably differentiate right from left.  COMPARISON:  None.  FINDINGS: RIGHT greater LEFT mandible soft tissue swelling, reticulated fat without focal fluid collections on this noncontrast study, no subcutaneous gas or radiopaque foreign bodies.  Mandible is intact, the condyles are located, no facial fracture.  Small RIGHT posterior maxillary sinus mucosal retention cyst with trace paranasal sinus mucosal thickening, no air-fluid levels. Nasal septum is midline. No destructive bony lesions.  Remote RIGHT medial orbital blowout fracture. Mild proptosis bilaterally. Preservation of retrobulbar fat. Extraocular muscles are located, unremarkable. Normal appearance optic nerve sheath complexes.  Borderline suspected lymphadenopathy incompletely imaged is likely reactive.  IMPRESSION: Facial soft tissue swelling most consistent with cellulitis, limited assessment for abscess on noncontrast CT. No subcutaneous gas.  No acute facial fracture.  Borderline partially imaged cervical lymphadenopathy is likely reactive.   Electronically Signed   By: Elon Alas   On: 10/16/2014 06:20         Subjective: Patient complaint generalized weakness. He complains of some chest discomfort shortness of breath. Has some nausea  without vomiting. Denies abdominal pain, dysuria, hematuria, medication, melena. He is complaining of loose stools.  Objective: Filed Vitals:   10/09/14 1357 10/09/14 2113 10/10/14 0454 10/10/14  1030  BP: 167/67 179/74 182/70 223/71  Pulse: 66 68 66 80  Temp: 98.8 F (37.1 C) 98.6 F (37 C) 99.1 F (37.3 C)   TempSrc: Oral Oral Oral   Resp: _0 Height:      Weight:      SpO2: 90% 94% 92% 97%    Intake/Output Summary (Last 24 hours) at 10/10/14 1108 Last data filed at 10/10/14 3419  Gross per 24 hour  Intake 3173.33 ml  Output      0 ml  Net 3173.33 ml   Weight change:  Exam:   General:  Pt is alert, follows commands appropriately, not in acute distress  HEENT: No icterus, No thrush, No neck mass, [place]/AT  Cardiovascular: RRR, S1/S2, no rubs, no gallops  Respiratory: Bibasilar crackles. No wheezing. Good air movement.  Abdomen: Soft/+BS, non tender, non distended, no guarding  Extremities: trace LE edema, No lymphangitis, No petechiae, No rashes, no synovitis  Data Reviewed: Basic Metabolic Panel:  Recent Labs Lab 10/08/14 0520 10/08/14 0525 10/09/14 0606 10/10/14 0416  NA 138 141 140 139  K 4.1 4.1 3.7 3.6  CL 104 104 108 109  CO2 26  --  24 23  GLUCOSE 148* 148* 115* 107*  BUN 27* 30* 29* 20  CREATININE 2.48* 2.40* 2.09* 1.53*  CALCIUM 9.1  --  8.2* 8.5   Liver Function Tests: No results for input(s): AST, ALT, ALKPHOS, BILITOT, PROT, ALBUMIN in the last 168 hours. No results for input(s): LIPASE, AMYLASE in the last 168 hours. No results for input(s): AMMONIA in the last 168 hours. CBC:  Recent Labs Lab 10/08/14 0520 10/08/14 0525 10/09/14 0606 10/10/14 0416  WBC 14.7*  --  11.4* 11.3*  NEUTROABS 9.4*  --   --   --   HGB 11.8* 13.3 10.5* 11.1*  HCT 36.1* 39.0 32.5* 33.8*  MCV 93.5  --  93.7 92.3  PLT 167  --  147* 149*   Cardiac Enzymes: No results for input(s): CKTOTAL, CKMB, CKMBINDEX, TROPONINI in the last 168  hours. BNP: Invalid input(s): POCBNP CBG:  Recent Labs Lab 10/09/14 1155 10/09/14 1704 10/09/14 2111 10/09/14 2218 10/10/14 0800  GLUCAP 122* 108* 153* 136* 100*    Recent Results (from the past 240 hour(s))  Culture, blood (routine x 2)     Status: None (Preliminary result)   Collection Time: 10/08/14 11:21 AM  Result Value Ref Range Status   Specimen Description RIGHT ANTECUBITAL  Final   Special Requests BOTTLES DRAWN AEROBIC AND ANAEROBIC [place]  Final   Culture   Final           BLOOD CULTURE RECEIVED NO GROWTH TO DATE CULTURE WILL BE HELD FOR 5 DAYS BEFORE ISSUING A FINAL NEGATIVE REPORT Performed at [facility]    Report Status PENDING  Incomplete  Culture, blood (routine x 2)     Status: None (Preliminary result)   Collection Time: 10/08/14 11:37 AM  Result Value Ref Range Status   Specimen Description BLOOD RIGHT ARM  Final   Special Requests BOTTLES DRAWN AEROBIC ONLY 4CC  Final   Culture   Final           BLOOD CULTURE RECEIVED NO GROWTH TO DATE CULTURE WILL BE HELD FOR 5 DAYS BEFORE ISSUING A FINAL NEGATIVE REPORT Performed at [facility]    Report Status PENDING  Incomplete     Scheduled Meds: . cloNIDine  0.1 mg Oral TID  . docusate  sodium  100 mg Oral BID  . glipiZIDE  5 mg Oral Daily  . heparin  5,000 Units Subcutaneous 3 times per day  . insulin aspart  0-15 Units Subcutaneous TID WC  . insulin aspart  0-5 Units Subcutaneous QHS  . insulin glargine  20 Units Subcutaneous QHS  . labetalol  400 mg Oral BID  . tetrahydrozoline  2 drop Both Eyes BID  . vancomycin  1,500 mg Intravenous Q24H  . verapamil  360 mg Oral Daily   Continuous Infusions: . sodium chloride 100 mL/hr at 10/10/14 0546     Johnathan Tortorelli, DO  [facility] Pager [phone]  If 7PM-7AM, please contact night-coverage [email or web address] Password [email or web address] 10/10/2014, 11:08 AM   LOS: 2 days

## 2014-10-10 NOTE — Progress Notes (Signed)
t note:  Pt has refused cpap at this time.  Rt will continue to monitor.

## 2014-10-11 ENCOUNTER — Inpatient Hospital Stay (HOSPITAL_COMMUNITY): Payer: Medicare Other

## 2014-10-11 DIAGNOSIS — R0600 Dyspnea, unspecified: Secondary | ICD-10-CM

## 2014-10-11 LAB — URINE MICROSCOPIC-ADD ON

## 2014-10-11 LAB — GLUCOSE, CAPILLARY
Glucose-Capillary: 125 mg/dL — ABNORMAL HIGH (ref 70–99)
Glucose-Capillary: 149 mg/dL — ABNORMAL HIGH (ref 70–99)
Glucose-Capillary: 173 mg/dL — ABNORMAL HIGH (ref 70–99)
Glucose-Capillary: 233 mg/dL — ABNORMAL HIGH (ref 70–99)

## 2014-10-11 LAB — BASIC METABOLIC PANEL
Anion gap: 6 (ref 5–15)
BUN: 15 mg/dL (ref 6–23)
CO2: 29 mmol/L (ref 19–32)
Calcium: 9 mg/dL (ref 8.4–10.5)
Chloride: 105 mmol/L (ref 96–112)
Creatinine, Ser: 1.52 mg/dL — ABNORMAL HIGH (ref 0.50–1.35)
GFR calc Af Amer: 52 mL/min — ABNORMAL LOW (ref >90)
GFR calc non Af Amer: 45 mL/min — ABNORMAL LOW (ref >90)
Glucose, Bld: 156 mg/dL — ABNORMAL HIGH (ref 70–99)
Potassium: 3 mmol/L — ABNORMAL LOW (ref 3.5–5.1)
Sodium: 140 mmol/L (ref 135–145)

## 2014-10-11 LAB — URINALYSIS, ROUTINE W REFLEX MICROSCOPIC
Bilirubin Urine: NEGATIVE
Glucose, UA: NEGATIVE mg/dL
Ketones, ur: 15 mg/dL — AB
Leukocytes, UA: NEGATIVE
Nitrite: NEGATIVE
Protein, ur: 30 mg/dL — AB
Specific Gravity, Urine: 1.014 (ref 1.005–1.030)
Urobilinogen, UA: 0.2 mg/dL (ref 0.0–1.0)
pH: 6.5 (ref 5.0–8.0)

## 2014-10-11 LAB — HEMOGLOBIN A1C
Hgb A1c MFr Bld: 7.1 % — ABNORMAL HIGH (ref 4.8–5.6)
Mean Plasma Glucose: 157 mg/dL

## 2014-10-11 LAB — CBC
HCT: 33.2 % — ABNORMAL LOW (ref 39.0–52.0)
Hemoglobin: 11 g/dL — ABNORMAL LOW (ref 13.0–17.0)
MCH: 30.1 pg (ref 26.0–34.0)
MCHC: 33.1 g/dL (ref 30.0–36.0)
MCV: 91 fL (ref 78.0–100.0)
Platelets: 174 10*3/uL (ref 150–400)
RBC: 3.65 MIL/uL — ABNORMAL LOW (ref 4.22–5.81)
RDW: 12.6 % (ref 11.5–15.5)
WBC: 15.1 10*3/uL — ABNORMAL HIGH (ref 4.0–10.5)

## 2014-10-11 LAB — LACTIC ACID, PLASMA
Lactic Acid, Venous: 1.1 mmol/L (ref 0.5–2.0)
Lactic Acid, Venous: 0.9 mmol/L (ref 0.5–2.0)

## 2014-10-11 LAB — TROPONIN I: Troponin I: 0.07 ng/mL — ABNORMAL HIGH (ref <0.031)

## 2014-10-11 MED ORDER — HYDRALAZINE HCL 10 MG PO TABS
10.0000 mg | ORAL_TABLET | Freq: Two times a day (BID) | ORAL | Status: DC
  Administered 2014-10-11: 10 mg via ORAL
  Filled 2014-10-11: qty 1

## 2014-10-11 MED ORDER — POTASSIUM CHLORIDE CRYS ER 20 MEQ PO TBCR
40.0000 meq | EXTENDED_RELEASE_TABLET | Freq: Once | ORAL | Status: AC
  Administered 2014-10-11: 40 meq via ORAL
  Filled 2014-10-11: qty 2

## 2014-10-11 MED ORDER — ASPIRIN 81 MG PO CHEW
81.0000 mg | CHEWABLE_TABLET | Freq: Every day | ORAL | Status: DC
  Administered 2014-10-12 – 2014-10-13 (×2): 81 mg via ORAL
  Filled 2014-10-11 (×2): qty 1

## 2014-10-11 MED ORDER — FUROSEMIDE 20 MG PO TABS
20.0000 mg | ORAL_TABLET | Freq: Once | ORAL | Status: AC
  Administered 2014-10-11: 20 mg via ORAL
  Filled 2014-10-11: qty 1

## 2014-10-11 NOTE — Progress Notes (Signed)
Echocardiogram 2D Echocardiogram has been performed.  Zayon Trulson 10/11/2014, 1:13 PM

## 2014-10-11 NOTE — Progress Notes (Addendum)
PROGRESS NOTE  Rilen Shukla XKG:[id number] DOB: Nov 11, 1945 DOA: 10/08/2014 PCP: Andria Frames, MD   Brief history 69 year old male with history of diabetes mellitus, hypertension, obstructive sleep apnea, and CKD stage III presents with one-week history of increased swelling and pain from a lesion underneath his lower lip. The patient shaved over a "bump" underneath his lower lip about 1 week prior to admission. Approximately 2 days later, he noted increasing pain and swelling. He went to the emergency department on 10/07/2014 and was placed on Bactrim DS, 1 tablet twice a day. There was no improvement. As a result who presented back to the emergency room on 10/08/2014. The patient denied any fevers, chills, neck swelling, dysphagia, headaches. He states that he has had of at least 3 facial boils and infections over the past 2-3 years. They would improve with antibiotic therapy without any surgery. Unfortunately, the patient developed acute diastolic CHF during hospitalization after fluid resuscitation.Marland Kitchen He improved with furosemide 40 mg IV 1.  Assessment/Plan: facial cellulitis/furunculosis -likely a staphylococcal infection -clinically improving -d/c zosyn -continue vanco IV-->face improving -CT maxillofacial negative for any abscess or subcutaneous air  -No problems with chewing or swallowing at this time  Superficial thrombophlebitis-Left arm -old IV site -likely source of patient's fever on 10/10/14 evening (102.3) acute on chronic renal failure (CKD stage III) -Baseline creatinine 1.5-1.8 -Multifactorial including volume depletion in the setting of torsemide, Bactrim, and Inspra use -d/c bactrim and Inspra -Bactrim inhibits tubular secretion of creatinine and can Mildly elevated serum creatinine  -Discontinue colchicine Acute Diastolic CHF/Acute respiratory failure -Chest x-ray--pulmonary vascular congestion right greater than left -Echocardiogram--EF 50-55 percent,  grade 2 diastolic, severe pulm HTN -Supplemental oxygen--now stable on RA (97%) -4/4--one dose lasix 38m IV, redose lasix 256mpo today -BMP in am Severe Pulmonary HTN -likely due to noncompliance with CPAP -has established OSA, not used CPAP x 4-5 yrs -repeat outpt polysomnogram and restart CPAP thereafter Atypical chest pain -cycle troponin--mild elevation due to CHF and demand ischemia -now pain free -ASA to 81 mg diabetes mellitus type 2 -Hemoglobin A1c--7.1 -Hold glipizide -NovoLog sliding scale Hypertension--uncontrolled -Monitor off of minoxidil, Inspra and torsemide -Continue clonidine and verapamil -increased dose of clonidine 0.66m73mid on 4/4 -add hydralazine History of tobacco use -15-pack-year history -Quit 40 years ago OSA -need outpatient polysomnogram -Supplemental oxygen while sleeping Diarrhea -C. difficile PCR Hypokalemia  -Replete Family Communication: Pt at beside Disposition Plan: Home 1-2 days      Procedures/Studies: Dg Chest 2 View  10/10/2014   CLINICAL DATA:  New onset dyspnea.  Hypoxemia.  EXAM: CHEST  2 VIEW  COMPARISON:  11/28/2013  FINDINGS: Mild enlargement of the cardiopericardial silhouette. Indistinct pulmonary vasculature, particularly on the right. No overt edema.  Tortuous thoracic aorta with mild atherosclerotic calcification of the aortic arch.  No definite pleural effusion.  IMPRESSION: 1. Cardiomegaly with pulmonary venous hypertension. There is some asymmetry of the pulmonary vasculature with the right-sided vasculature appearing slightly more prominent than the left. If there is high clinical suspicion for pulmonary embolus thin CT angiography of the chest or nuclear medicine ventilation perfusion lung scan may be warranted. 2. Low lung volumes are present, causing crowding of the pulmonary vasculature.   Electronically Signed   By: WalVan ClinesD.   On: 10/10/2014 14:57   Ct Head Wo Contrast  10/11/2014   CLINICAL  DATA:  New onset headaches with uncontrolled high blood pressure.  EXAM: CT HEAD WITHOUT CONTRAST  TECHNIQUE: Contiguous axial images were obtained from the base of the skull through the vertex without intravenous contrast.  COMPARISON:  None.  FINDINGS: Diffuse cerebral atrophy. Minimal ventricular dilatation consistent with central atrophy. Patchy low-attenuation changes in the deep white matter consistent with small vessel ischemia. No mass effect or midline shift. No abnormal extra-axial fluid collections. Gray-white matter junctions are distinct. Basal cisterns are not effaced. No evidence of acute intracranial hemorrhage. No depressed skull fractures. Mild mucosal thickening in the paranasal sinuses. Mastoid air cells are not opacified. Vascular calcifications.  IMPRESSION: No acute intracranial abnormalities. Chronic atrophy and small vessel ischemic changes.   Electronically Signed   By: Lucienne Capers M.D.   On: 10/11/2014 04:24   Ct Maxillofacial Wo Cm  10/08/2014   CLINICAL DATA:  Cut self while shaving 5 days ago with persistent lower live swelling. History of diabetes.  EXAM: CT MAXILLOFACIAL WITHOUT CONTRAST  TECHNIQUE: Multidetector CT imaging of the maxillofacial structures was performed. Multiplanar CT image reconstructions were also generated. A small metallic BB was placed on the right temple in order to reliably differentiate right from left.  COMPARISON:  None.  FINDINGS: RIGHT greater LEFT mandible soft tissue swelling, reticulated fat without focal fluid collections on this noncontrast study, no subcutaneous gas or radiopaque foreign bodies.  Mandible is intact, the condyles are located, no facial fracture.  Small RIGHT posterior maxillary sinus mucosal retention cyst with trace paranasal sinus mucosal thickening, no air-fluid levels. Nasal septum is midline. No destructive bony lesions.  Remote RIGHT medial orbital blowout fracture. Mild proptosis bilaterally. Preservation of retrobulbar  fat. Extraocular muscles are located, unremarkable. Normal appearance optic nerve sheath complexes.  Borderline suspected lymphadenopathy incompletely imaged is likely reactive.  IMPRESSION: Facial soft tissue swelling most consistent with cellulitis, limited assessment for abscess on noncontrast CT. No subcutaneous gas.  No acute facial fracture.  Borderline partially imaged cervical lymphadenopathy is likely reactive.   Electronically Signed   By: Elon Alas   On: 10/08/2014 06:20         Subjective: Patient states that his breathing better today. Has some left arm pain and his antecubital area. Denies any headaches, chest pain, nausea, vomiting, diarrhea, abdominal pain, dysuria.  Objective: Filed Vitals:   10/10/14 2331 10/11/14 0511 10/11/14 0758 10/11/14 1400  BP: 182/70 183/66 186/71 161/67  Pulse: 62 58 58 57  Temp: 99.4 F (37.4 C) 98.7 F (37.1 C) 99.1 F (37.3 C) 98.4 F (36.9 C)  TempSrc: Oral Oral Oral Oral  Resp:  _0 Height:      Weight:      SpO2: 94% 94% 98% 92%    Intake/Output Summary (Last 24 hours) at 10/11/14 1941 Last data filed at 10/11/14 1429  Gross per 24 hour  Intake   1250 ml  Output    725 ml  Net    525 ml   Weight change:  Exam:   General:  Pt is alert, follows commands appropriately, not in acute distress  HEENT: No icterus, No thrush,  [place]/AT  Cardiovascular: RRR, S1/S2, no rubs, no gallops  Respiratory: Bibasilar crackles. No wheeze. Good air movement.  Abdomen: Soft/+BS, non tender, non distended, no guarding  Extremities: trace LE edema, No lymphangitis, No petechiae, No rashes, no synovitis  Data Reviewed: Basic Metabolic Panel:  Recent Labs Lab 10/08/14 0520 10/08/14 0525 10/09/14 0606 10/10/14 0416 10/11/14 0926  NA 138 141 140 139 140  K 4.1 4.1 3.7 3.6 3.0*  CL 104 104  108 109 105  CO2 26  --  _0 GLUCOSE 148* 148* 115* 107* 156*  BUN 27* 30* 29* 20 15  CREATININE 2.48* 2.40* 2.09* 1.53*  1.52*  CALCIUM 9.1  --  8.2* 8.5 9.0   Liver Function Tests: No results for input(s): AST, ALT, ALKPHOS, BILITOT, PROT, ALBUMIN in the last 168 hours. No results for input(s): LIPASE, AMYLASE in the last 168 hours. No results for input(s): AMMONIA in the last 168 hours. CBC:  Recent Labs Lab 10/08/14 0520 10/08/14 0525 10/09/14 0606 10/10/14 0416 10/11/14 0926  WBC 14.7*  --  11.4* 11.3* 15.1*  NEUTROABS 9.4*  --   --   --   --   HGB 11.8* 13.3 10.5* 11.1* 11.0*  HCT 36.1* 39.0 32.5* 33.8* 33.2*  MCV 93.5  --  93.7 92.3 91.0  PLT 167  --  147* 149* 174   Cardiac Enzymes:  Recent Labs Lab 10/10/14 1145 10/10/14 1612 10/10/14 2305  TROPONINI 0.06* 0.06* 0.07*   BNP: Invalid input(s): POCBNP CBG:  Recent Labs Lab 10/10/14 1653 10/10/14 2203 10/11/14 0830 10/11/14 1231 10/11/14 1742  GLUCAP 151* 129* 125* 149* 173*    Recent Results (from the past 240 hour(s))  Culture, blood (routine x 2)     Status: None (Preliminary result)   Collection Time: 10/08/14 11:21 AM  Result Value Ref Range Status   Specimen Description RIGHT ANTECUBITAL  Final   Special Requests BOTTLES DRAWN AEROBIC AND ANAEROBIC [place]  Final   Culture   Final           BLOOD CULTURE RECEIVED NO GROWTH TO DATE CULTURE WILL BE HELD FOR 5 DAYS BEFORE ISSUING A FINAL NEGATIVE REPORT Performed at [facility]    Report Status PENDING  Incomplete  Culture, blood (routine x 2)     Status: None (Preliminary result)   Collection Time: 10/08/14 11:37 AM  Result Value Ref Range Status   Specimen Description BLOOD RIGHT ARM  Final   Special Requests BOTTLES DRAWN AEROBIC ONLY 4CC  Final   Culture   Final           BLOOD CULTURE RECEIVED NO GROWTH TO DATE CULTURE WILL BE HELD FOR 5 DAYS BEFORE ISSUING A FINAL NEGATIVE REPORT Performed at [facility]    Report Status PENDING  Incomplete     Scheduled Meds: . aspirin  325 mg Oral Daily  . cloNIDine  0.2 mg Oral TID  . docusate sodium   100 mg Oral BID  . heparin  5,000 Units Subcutaneous 3 times per day  . insulin aspart  0-15 Units Subcutaneous TID WC  . insulin aspart  0-5 Units Subcutaneous QHS  . insulin glargine  20 Units Subcutaneous QHS  . labetalol  400 mg Oral BID  . tetrahydrozoline  2 drop Both Eyes BID  . vancomycin  1,500 mg Intravenous Q24H  . verapamil  360 mg Oral Daily   Continuous Infusions:    Sunset Joshi, DO  [facility] Pager [phone]  If 7PM-7AM, please contact night-coverage [email or web address] Password [email or web address] 10/11/2014, 7:41 PM   LOS: 3 days

## 2014-10-11 NOTE — Progress Notes (Addendum)
Pt's BP=189/75, HR=57; scheduled clonidine and hydralazine given. Labatalol 400 mg was held due to HR is low. Made K Schorr on call for [facility] aware. Recheck BP at 0000 and if BP still high and HR is >60 give the Labatalol.  Recheck BP=205/77, HR=54; K. Schorr made aware; x1 order of hydralazine 55m IV given will continue to monitor pt. 12:59 AM 10/12/2014  BP=185/75, HR=61 3:02 AM 10/12/2014

## 2014-10-12 LAB — CBC
HCT: 36.7 % — ABNORMAL LOW (ref 39.0–52.0)
Hemoglobin: 12.4 g/dL — ABNORMAL LOW (ref 13.0–17.0)
MCH: 30.2 pg (ref 26.0–34.0)
MCHC: 33.8 g/dL (ref 30.0–36.0)
MCV: 89.5 fL (ref 78.0–100.0)
Platelets: 199 10*3/uL (ref 150–400)
RBC: 4.1 MIL/uL — ABNORMAL LOW (ref 4.22–5.81)
RDW: 12.6 % (ref 11.5–15.5)
WBC: 11.7 10*3/uL — ABNORMAL HIGH (ref 4.0–10.5)

## 2014-10-12 LAB — GLUCOSE, CAPILLARY
Glucose-Capillary: 125 mg/dL — ABNORMAL HIGH (ref 70–99)
Glucose-Capillary: 187 mg/dL — ABNORMAL HIGH (ref 70–99)
Glucose-Capillary: 174 mg/dL — ABNORMAL HIGH (ref 70–99)
Glucose-Capillary: 162 mg/dL — ABNORMAL HIGH (ref 70–99)

## 2014-10-12 LAB — BASIC METABOLIC PANEL
Anion gap: 14 (ref 5–15)
BUN: 15 mg/dL (ref 6–23)
CO2: 22 mmol/L (ref 19–32)
Calcium: 9.4 mg/dL (ref 8.4–10.5)
Chloride: 104 mmol/L (ref 96–112)
Creatinine, Ser: 1.38 mg/dL — ABNORMAL HIGH (ref 0.50–1.35)
GFR calc Af Amer: 58 mL/min — ABNORMAL LOW (ref >90)
GFR calc non Af Amer: 50 mL/min — ABNORMAL LOW (ref >90)
Glucose, Bld: 131 mg/dL — ABNORMAL HIGH (ref 70–99)
Potassium: 3.3 mmol/L — ABNORMAL LOW (ref 3.5–5.1)
Sodium: 140 mmol/L (ref 135–145)

## 2014-10-12 LAB — URINE CULTURE
Colony Count: NO GROWTH
Culture: NO GROWTH

## 2014-10-12 LAB — CLOSTRIDIUM DIFFICILE BY PCR: Toxigenic C. Difficile by PCR: NEGATIVE

## 2014-10-12 MED ORDER — HYDRALAZINE HCL 25 MG PO TABS
25.0000 mg | ORAL_TABLET | Freq: Three times a day (TID) | ORAL | Status: DC
  Administered 2014-10-12 – 2014-10-13 (×3): 25 mg via ORAL
  Filled 2014-10-12 (×3): qty 1

## 2014-10-12 MED ORDER — HYDRALAZINE HCL 20 MG/ML IJ SOLN
10.0000 mg | Freq: Once | INTRAMUSCULAR | Status: AC
  Administered 2014-10-12: 10 mg via INTRAVENOUS
  Filled 2014-10-12: qty 1

## 2014-10-12 MED ORDER — POTASSIUM CHLORIDE CRYS ER 20 MEQ PO TBCR
40.0000 meq | EXTENDED_RELEASE_TABLET | Freq: Once | ORAL | Status: AC
  Administered 2014-10-12: 40 meq via ORAL
  Filled 2014-10-12: qty 2

## 2014-10-12 NOTE — Progress Notes (Signed)
[facility]                                                                              Patient Demographics  Dylan [name], is a 69 y.o. male, DOB - [birth year], JFH:[id number]  Admit date - 10/08/2014   Admitting Physician Verlee Monte, MD  Outpatient Primary MD for the patient is Andria Frames, MD  LOS - 4   Chief Complaint  Patient presents with  . Facial Swelling        Assessment & Plan   Facial cellulitis/furunculosis -likely a staphylococcal infection -clinically improving -d/c zosyn -continue vanco IV-->face improving -CT maxillofacial negative for any abscess or subcutaneous air  -No problems with chewing or swallowing at this time   Superficial thrombophlebitis-Left arm -old IV site -likely source of patient's fever on 10/10/14 evening (102.3) -Will obtain LUE [place] to rule out DVT  Acute on chronic renal failure (CKD stage III) -Baseline creatinine 1.5-1.8, Cr improving -Multifactorial including volume depletion in the setting of torsemide, Bactrim, and Inspra use -d/c bactrim and Inspra -Bactrim inhibits tubular secretion of creatinine and can Mildly elevated serum creatinine  -Discontinue colchicine  Acute Diastolic CHF/Acute respiratory failure -Improving  -Chest x-ray--pulmonary vascular congestion right greater than left -Echocardiogram--EF 50-55 percent, grade 2 diastolic, severe pulm HTN -Supplemental oxygen--now stable on RA (97%) -4/4--one dose lasix 54m IV, redose lasix 275mpo today -BMP in am  Severe Pulmonary HTN -likely due to noncompliance with CPAP -has established OSA, not used CPAP x 4-5 yrs -repeat outpt polysomnogram and restart CPAP thereafter  Atypical chest pain -cycle troponin--mild elevation due to CHF and demand ischemia -now pain free -ASA to 81 mg  Diabetes mellitus type 2 -Hemoglobin A1c--7.1 -Hold glipizide -NovoLog sliding scale  Hypertension--uncontrolled -Monitor off of minoxidil, Inspra and  torsemide -Continue clonidine and verapamil -increased dose of clonidine 0.3m37mid on 4/4 -Increased hydralazine 56m37mD  History of tobacco use -15-pack-year history -Quit 40 years ago  OSA -need outpatient polysomnogram -Supplemental oxygen while sleeping  Diarrhea -C. difficile PCR negative  Hypokalemia  -Replete and continue to monitor   Code Status: Full  Family Communication: None at bedside  Disposition Plan: Admitted  Time Spent in minutes   30 minutes  Procedures  LUE Doppler  Consults   None  DVT Prophylaxis  heparin  Lab Results  Component Value Date   PLT 199 10/12/2014    Medications  Scheduled Meds: . aspirin  81 mg Oral Daily  . cloNIDine  0.2 mg Oral TID  . docusate sodium  100 mg Oral BID  . heparin  5,000 Units Subcutaneous 3 times per day  . hydrALAZINE  25 mg Oral 3 times per day  . insulin aspart  0-15 Units Subcutaneous TID WC  . insulin aspart  0-5 Units Subcutaneous QHS  . insulin glargine  20 Units Subcutaneous QHS  . labetalol  400 mg Oral BID  . tetrahydrozoline  2 drop Both Eyes BID  . vancomycin  1,500 mg Intravenous Q24H  . verapamil  360 mg Oral Daily   Continuous Infusions:  PRN Meds:.acetaminophen **OR** acetaminophen, albuterol, alum & mag hydroxide-simeth, bisacodyl, magnesium hydroxide, morphine injection, ondansetron **OR** ondansetron (ZOFRAN) IV,  oxyCODONE  Antibiotics    Anti-infectives    Start     Dose/Rate Route Frequency Ordered Stop   10/10/14 1100  vancomycin (VANCOCIN) 1,500 mg in sodium chloride 0.9 % 500 mL IVPB     1,500 mg 250 mL/hr over 120 Minutes Intravenous Every 24 hours 10/10/14 0847     10/09/14 1100  vancomycin (VANCOCIN) 1,250 mg in sodium chloride 0.9 % 250 mL IVPB  Status:  Discontinued     1,250 mg 166.7 mL/hr over 90 Minutes Intravenous Every 24 hours 10/08/14 1131 10/10/14 0847   10/08/14 2000  piperacillin-tazobactam (ZOSYN) IVPB 3.375 g  Status:  Discontinued     3.375 g 12.5  mL/hr over 240 Minutes Intravenous 3 times per day 10/08/14 1131 10/09/14 1040   10/08/14 1100  piperacillin-tazobactam (ZOSYN) IVPB 3.375 g     3.375 g 100 mL/hr over 30 Minutes Intravenous  Once 10/08/14 1023 10/08/14 1131   10/08/14 1100  vancomycin (VANCOCIN) IVPB 1000 mg/200 mL premix     1,000 mg 200 mL/hr over 60 Minutes Intravenous  Once 10/08/14 1023 10/08/14 1201   10/08/14 0530  clindamycin (CLEOCIN) IVPB 600 mg     600 mg 100 mL/hr over 30 Minutes Intravenous  Once 10/08/14 0515 10/08/14 4270        Subjective:   Dylan [name] seen and examined today.  Patient feels his facial rash has improved.  He denies chest pain or shortness of breath, abdominal pain.  Complains of left arm pain from old IV.   Objective:   Filed Vitals:   10/12/14 0559 10/12/14 0952 10/12/14 1145 10/12/14 1431  BP: 203/84 174/71 163/75 179/78  Pulse: 59 61 59 61  Temp: 99 F (37.2 C)   98.2 F (36.8 C)  TempSrc: Oral   Oral  Resp:      Height:      Weight:      SpO2: 100%   96%    Wt Readings from Last 3 Encounters:  10/08/14 96.163 kg (212 lb)  10/07/14 96.163 kg (212 lb)  11/28/13 93.895 kg (207 lb)     Intake/Output Summary (Last 24 hours) at 10/12/14 1506 Last data filed at 10/12/14 0908  Gross per 24 hour  Intake    240 ml  Output      0 ml  Net    240 ml    Exam  General: Well developed, well nourished, NAD, appears stated age  HEENT: NCAT, mucous membranes moist.   Cardiovascular: S1 S2 auscultated, no rubs, murmurs or gallops. Regular rate and rhythm.  Respiratory: Clear to auscultation bilaterally with equal chest rise  Abdomen: Soft, nontender, nondistended, + bowel sounds  Extremities: warm dry without cyanosis clubbing or edema.  Mild erythema- left antecubital area  Neuro: AAOx3, nonfocal  Psych: Normal affect and demeanor with intact judgement and insight  Data Review   Micro Results Recent Results (from the past 240 hour(s))  Culture, blood (routine  x 2)     Status: None (Preliminary result)   Collection Time: 10/08/14 11:21 AM  Result Value Ref Range Status   Specimen Description RIGHT ANTECUBITAL  Final   Special Requests BOTTLES DRAWN AEROBIC AND ANAEROBIC [place]  Final   Culture   Final           BLOOD CULTURE RECEIVED NO GROWTH TO DATE CULTURE WILL BE HELD FOR 5 DAYS BEFORE ISSUING A FINAL NEGATIVE REPORT Performed at [facility]    Report Status PENDING  Incomplete  Culture, blood (routine x 2)     Status: None (Preliminary result)   Collection Time: 10/08/14 11:37 AM  Result Value Ref Range Status   Specimen Description BLOOD RIGHT ARM  Final   Special Requests BOTTLES DRAWN AEROBIC ONLY 4CC  Final   Culture   Final           BLOOD CULTURE RECEIVED NO GROWTH TO DATE CULTURE WILL BE HELD FOR 5 DAYS BEFORE ISSUING A FINAL NEGATIVE REPORT Performed at [facility]    Report Status PENDING  Incomplete  Urine culture     Status: None   Collection Time: 10/11/14  8:45 AM  Result Value Ref Range Status   Specimen Description URINE, RANDOM  Final   Special Requests NONE  Final   Colony Count NO GROWTH Performed at [facility]   Final   Culture NO GROWTH Performed at [facility]   Final   Report Status 10/12/2014 FINAL  Final  Culture, blood (routine x 2)     Status: None (Preliminary result)   Collection Time: 10/11/14  9:26 AM  Result Value Ref Range Status   Specimen Description BLOOD LEFT HAND  Final   Special Requests   Final    BOTTLES DRAWN AEROBIC AND ANAEROBIC 10CC AEROBIC,5CC ANAEROBIC   Culture   Final           BLOOD CULTURE RECEIVED NO GROWTH TO DATE CULTURE WILL BE HELD FOR 5 DAYS BEFORE ISSUING A FINAL NEGATIVE REPORT Performed at [facility]    Report Status PENDING  Incomplete  Culture, blood (routine x 2)     Status: None (Preliminary result)   Collection Time: 10/11/14  9:35 AM  Result Value Ref Range Status   Specimen Description BLOOD RIGHT ANTECUBITAL   Final   Special Requests BOTTLES DRAWN AEROBIC AND ANAEROBIC 10CC EACH  Final   Culture   Final           BLOOD CULTURE RECEIVED NO GROWTH TO DATE CULTURE WILL BE HELD FOR 5 DAYS BEFORE ISSUING A FINAL NEGATIVE REPORT Performed at [facility]    Report Status PENDING  Incomplete  Clostridium Difficile by PCR     Status: None   Collection Time: 10/11/14 11:41 PM  Result Value Ref Range Status   C difficile by pcr NEGATIVE NEGATIVE Final    Radiology Reports Dg Chest 2 View  10/10/2014   CLINICAL DATA:  New onset dyspnea.  Hypoxemia.  EXAM: CHEST  2 VIEW  COMPARISON:  11/28/2013  FINDINGS: Mild enlargement of the cardiopericardial silhouette. Indistinct pulmonary vasculature, particularly on the right. No overt edema.  Tortuous thoracic aorta with mild atherosclerotic calcification of the aortic arch.  No definite pleural effusion.  IMPRESSION: 1. Cardiomegaly with pulmonary venous hypertension. There is some asymmetry of the pulmonary vasculature with the right-sided vasculature appearing slightly more prominent than the left. If there is high clinical suspicion for pulmonary embolus thin CT angiography of the chest or nuclear medicine ventilation perfusion lung scan may be warranted. 2. Low lung volumes are present, causing crowding of the pulmonary vasculature.   Electronically Signed   By: Van Clines M.D.   On: 10/10/2014 14:57   Ct Head Wo Contrast  10/11/2014   CLINICAL DATA:  New onset headaches with uncontrolled high blood pressure.  EXAM: CT HEAD WITHOUT CONTRAST  TECHNIQUE: Contiguous axial images were obtained from the base of the skull through the vertex without intravenous contrast.  COMPARISON:  None.  FINDINGS: Diffuse cerebral atrophy. Minimal ventricular dilatation consistent with central atrophy. Patchy low-attenuation changes in the deep white matter consistent with small vessel ischemia. No mass effect or midline shift. No abnormal extra-axial fluid collections.  Gray-white matter junctions are distinct. Basal cisterns are not effaced. No evidence of acute intracranial hemorrhage. No depressed skull fractures. Mild mucosal thickening in the paranasal sinuses. Mastoid air cells are not opacified. Vascular calcifications.  IMPRESSION: No acute intracranial abnormalities. Chronic atrophy and small vessel ischemic changes.   Electronically Signed   By: Lucienne Capers M.D.   On: 10/11/2014 04:24   Ct Maxillofacial Wo Cm  10/08/2014   CLINICAL DATA:  Cut self while shaving 5 days ago with persistent lower live swelling. History of diabetes.  EXAM: CT MAXILLOFACIAL WITHOUT CONTRAST  TECHNIQUE: Multidetector CT imaging of the maxillofacial structures was performed. Multiplanar CT image reconstructions were also generated. A small metallic BB was placed on the right temple in order to reliably differentiate right from left.  COMPARISON:  None.  FINDINGS: RIGHT greater LEFT mandible soft tissue swelling, reticulated fat without focal fluid collections on this noncontrast study, no subcutaneous gas or radiopaque foreign bodies.  Mandible is intact, the condyles are located, no facial fracture.  Small RIGHT posterior maxillary sinus mucosal retention cyst with trace paranasal sinus mucosal thickening, no air-fluid levels. Nasal septum is midline. No destructive bony lesions.  Remote RIGHT medial orbital blowout fracture. Mild proptosis bilaterally. Preservation of retrobulbar fat. Extraocular muscles are located, unremarkable. Normal appearance optic nerve sheath complexes.  Borderline suspected lymphadenopathy incompletely imaged is likely reactive.  IMPRESSION: Facial soft tissue swelling most consistent with cellulitis, limited assessment for abscess on noncontrast CT. No subcutaneous gas.  No acute facial fracture.  Borderline partially imaged cervical lymphadenopathy is likely reactive.   Electronically Signed   By: Elon Alas   On: 10/08/2014 06:20    CBC  Recent  Labs Lab 10/08/14 4562 10/08/14 0525 10/09/14 0606 10/10/14 0416 10/11/14 0926 10/12/14 0402  WBC 14.7*  --  11.4* 11.3* 15.1* 11.7*  HGB 11.8* 13.3 10.5* 11.1* 11.0* 12.4*  HCT 36.1* 39.0 32.5* 33.8* 33.2* 36.7*  PLT 167  --  147* 149* 174 199  MCV 93.5  --  93.7 92.3 91.0 89.5  MCH 30.6  --  30.3 30.3 30.1 30.2  MCHC 32.7  --  32.3 32.8 33.1 33.8  RDW 13.1  --  12.8 12.7 12.6 12.6  LYMPHSABS 2.9  --   --   --   --   --   MONOABS 2.1*  --   --   --   --   --   EOSABS 0.3  --   --   --   --   --   BASOSABS 0.0  --   --   --   --   --     Chemistries   Recent Labs Lab 10/08/14 0520 10/08/14 0525 10/09/14 0606 10/10/14 0416 10/11/14 0926 10/12/14 0402  NA 138 141 140 139 140 140  K 4.1 4.1 3.7 3.6 3.0* 3.3*  CL 104 104 108 109 105 104  CO2 26  --  _0 GLUCOSE 148* 148* 115* 107* 156* 131*  BUN 27* 30* 29* _1 CREATININE 2.48* 2.40* 2.09* 1.53* 1.52* 1.38*  CALCIUM 9.1  --  8.2* 8.5 9.0 9.4   ------------------------------------------------------------------------------------------------------------------ estimated creatinine clearance is 54.1 mL/min (by C-G formula based on Cr of 1.38). ------------------------------------------------------------------------------------------------------------------  Recent  Labs  10/10/14 0416  HGBA1C 7.1*   ------------------------------------------------------------------------------------------------------------------ No results for input(s): CHOL, HDL, LDLCALC, TRIG, CHOLHDL, LDLDIRECT in the last 72 hours. ------------------------------------------------------------------------------------------------------------------ No results for input(s): TSH, T4TOTAL, T3FREE, THYROIDAB in the last 72 hours.  Invalid input(s): FREET3 ------------------------------------------------------------------------------------------------------------------  Recent Labs  10/10/14 0416  VITAMINB12 445    Coagulation  profile No results for input(s): INR, PROTIME in the last 168 hours.  No results for input(s): DDIMER in the last 72 hours.  Cardiac Enzymes  Recent Labs Lab 10/10/14 1145 10/10/14 1612 10/10/14 2305  TROPONINI 0.06* 0.06* 0.07*   ------------------------------------------------------------------------------------------------------------------ Invalid input(s): POCBNP    Dylan [name] D.O. on 10/12/2014 at 3:06 PM  Between 7am to 7pm - Pager - [phone]  After 7pm go to [email or web address] - password [email or web address]  And look for the night coverage person covering for me after hours  [facility] Office  [phone]

## 2014-10-12 NOTE — Progress Notes (Signed)
Patient attempted to wear CPAP. Patient stated he could not wear CPAP after a very short period of time. Patients SP02=95% on room air at this time, will continue to monitor patient.

## 2014-10-12 NOTE — Progress Notes (Signed)
Inpatient Diabetes Program Recommendations  AACE/ADA: New Consensus Statement on Inpatient Glycemic Control (2013)  Target Ranges:  Prepandial:   less than 140 mg/dL      Peak postprandial:   less than 180 mg/dL (1-2 hours)      Critically ill patients:  140 - 180 mg/dL   Reason for Assessment:  Results for Dylan [name], [name] (MRN [id number]) as of 10/12/2014 15:22  Ref. Range 10/11/2014 12:31 10/11/2014 17:42 10/11/2014 21:57 10/12/2014 07:42 10/12/2014 11:51  Glucose-Capillary Latest Range: 70-99 mg/dL 149 (H) 173 (H) 233 (H) 125 (H) 187 (H)   Diabetes history: Type 2 diabetes Outpatient Diabetes medications: Lantus 20 units q HS, Glipizide 5 mg daily Current orders for Inpatient glycemic control:  Novolog moderate tid with meals and HS, Lantus 20 units daily  May consider adding Novolog meal coverage 3 units tid with meals (Hold if pt eats less than 50%).  Thanks, Adah Perl, RN, BC-ADM Inpatient Diabetes Coordinator Pager [phone] (8a-5p)

## 2014-10-13 DIAGNOSIS — R220 Localized swelling, mass and lump, head: Secondary | ICD-10-CM

## 2014-10-13 DIAGNOSIS — R609 Edema, unspecified: Secondary | ICD-10-CM

## 2014-10-13 LAB — BASIC METABOLIC PANEL
Anion gap: 10 (ref 5–15)
BUN: 21 mg/dL (ref 6–23)
CO2: 22 mmol/L (ref 19–32)
Calcium: 9.4 mg/dL (ref 8.4–10.5)
Chloride: 106 mmol/L (ref 96–112)
Creatinine, Ser: 1.51 mg/dL — ABNORMAL HIGH (ref 0.50–1.35)
GFR calc Af Amer: 52 mL/min — ABNORMAL LOW (ref >90)
GFR calc non Af Amer: 45 mL/min — ABNORMAL LOW (ref >90)
Glucose, Bld: 112 mg/dL — ABNORMAL HIGH (ref 70–99)
Potassium: 3.6 mmol/L (ref 3.5–5.1)
Sodium: 138 mmol/L (ref 135–145)

## 2014-10-13 LAB — CBC
HCT: 37.6 % — ABNORMAL LOW (ref 39.0–52.0)
Hemoglobin: 12.4 g/dL — ABNORMAL LOW (ref 13.0–17.0)
MCH: 30 pg (ref 26.0–34.0)
MCHC: 33 g/dL (ref 30.0–36.0)
MCV: 90.8 fL (ref 78.0–100.0)
Platelets: 220 10*3/uL (ref 150–400)
RBC: 4.14 MIL/uL — ABNORMAL LOW (ref 4.22–5.81)
RDW: 12.8 % (ref 11.5–15.5)
WBC: 11.7 10*3/uL — ABNORMAL HIGH (ref 4.0–10.5)

## 2014-10-13 LAB — GLUCOSE, CAPILLARY
Glucose-Capillary: 91 mg/dL (ref 70–99)
Glucose-Capillary: 160 mg/dL — ABNORMAL HIGH (ref 70–99)

## 2014-10-13 LAB — VANCOMYCIN, TROUGH: Vancomycin Tr: 12.8 ug/mL (ref 10.0–20.0)

## 2014-10-13 MED ORDER — HYDRALAZINE HCL 50 MG PO TABS
50.0000 mg | ORAL_TABLET | Freq: Three times a day (TID) | ORAL | Status: DC
  Administered 2014-10-13: 50 mg via ORAL
  Filled 2014-10-13: qty 1

## 2014-10-13 MED ORDER — ASPIRIN 81 MG PO CHEW: 81.0000 mg | CHEWABLE_TABLET | Freq: Every day | ORAL | Status: DC

## 2014-10-13 MED ORDER — DOCUSATE SODIUM 100 MG PO CAPS: 100.0000 mg | ORAL_CAPSULE | Freq: Two times a day (BID) | ORAL | Status: DC

## 2014-10-13 MED ORDER — CLONIDINE HCL 0.2 MG PO TABS: 0.2000 mg | ORAL_TABLET | Freq: Three times a day (TID) | ORAL | Status: DC

## 2014-10-13 MED ORDER — ALUM & MAG HYDROXIDE-SIMETH 200-200-20 MG/5ML PO SUSP: 30.0000 mL | Freq: Four times a day (QID) | ORAL | Status: DC | PRN

## 2014-10-13 MED ORDER — HYDRALAZINE HCL 25 MG PO TABS
25.0000 mg | ORAL_TABLET | Freq: Once | ORAL | Status: AC
  Administered 2014-10-13: 25 mg via ORAL
  Filled 2014-10-13: qty 1

## 2014-10-13 MED ORDER — DOXYCYCLINE HYCLATE 50 MG PO CAPS: 50.0000 mg | ORAL_CAPSULE | Freq: Two times a day (BID) | ORAL | Status: DC

## 2014-10-13 MED ORDER — HYDRALAZINE HCL 50 MG PO TABS: 50.0000 mg | ORAL_TABLET | Freq: Three times a day (TID) | ORAL | Status: DC

## 2014-10-13 NOTE — Progress Notes (Signed)
Left upper extremity venous duplex completed.  No evidence of DVT in the left arm and right subclavian vein.  There appears to be superficial thrombosis in the left cephalic vein.

## 2014-10-13 NOTE — Discharge Instructions (Signed)
Cellulitis Cellulitis is an infection of the skin and the tissue beneath it. The infected area is usually red and tender. Cellulitis occurs most often in the arms and lower legs.  CAUSES  Cellulitis is caused by bacteria that enter the skin through cracks or cuts in the skin. The most common types of bacteria that cause cellulitis are staphylococci and streptococci. SIGNS AND SYMPTOMS   Redness and warmth.  Swelling.  Tenderness or pain.  Fever. DIAGNOSIS  Your health care provider can usually determine what is wrong based on a physical exam. Blood tests may also be done. TREATMENT  Treatment usually involves taking an antibiotic medicine. HOME CARE INSTRUCTIONS   Take your antibiotic medicine as directed by your health care provider. Finish the antibiotic even if you start to feel better.  Keep the infected arm or leg elevated to reduce swelling.  Apply a warm cloth to the affected area up to 4 times per day to relieve pain.  Take medicines only as directed by your health care provider.  Keep all follow-up visits as directed by your health care provider. SEEK MEDICAL CARE IF:   You notice red streaks coming from the infected area.  Your red area gets larger or turns dark in color.  Your bone or joint underneath the infected area becomes painful after the skin has healed.  Your infection returns in the same area or another area.  You notice a swollen bump in the infected area.  You develop new symptoms.  You have a fever. SEEK IMMEDIATE MEDICAL CARE IF:   You feel very sleepy.  You develop vomiting or diarrhea.  You have a general ill feeling (malaise) with muscle aches and pains. MAKE SURE YOU:   Understand these instructions.  Will watch your condition.  Will get help right away if you are not doing well or get worse. Document Released: 04/03/2005 Document Revised: 11/08/2013 Document Reviewed: 09/09/2011 [facility] Patient Information 2015 [place], [place].  This information is not intended to replace advice given to you by your health care provider. Make sure you discuss any questions you have with your health care provider.

## 2014-10-13 NOTE — Progress Notes (Signed)
Patient discharged to home with instructions.

## 2014-10-13 NOTE — Discharge Summary (Signed)
Physician Discharge Summary  Dylan [name] YQI:[id number] DOB: 04/11/1946 DOA: 10/08/2014  PCP: Andria Frames, MD  Admit date: 10/08/2014 Discharge date: 10/13/2014  Time spent: 45 minutes  Recommendations for Outpatient Follow-up:  Patient will be discharged home. He should follow up with his primary care physician within one week of discharge for follow-up of cellulitis and hypertension. Patient should also have a sleep study conducted. Patient continue a heart healthy/carb modified diet. Patient to continue his medications as prescribed.   Discharge Diagnoses:  Facial cellulitis/furunculosis Superficial thrombophlebitis, left arm Acute on chronic renal failure, stage III Acute diastolic CHF/acute respiratory failure Severe pulmonary hypertension Atypical chest pain Diabetes mellitus, type II Uncontrolled hypertension next line history of tobacco abuse Obstructive sleep apnea Diarrhea Hypokalemia  Discharge Condition: Stable  Diet recommendation: Heart healthy/carb modified  Filed Weights   10/08/14 0200 10/08/14 1054  Weight: 96.163 kg (212 lb) 96.163 kg (212 lb)    History of present illness:  69 year old male patient with past medical history diabetes on insulin, hypertension, sleep apnea, former tobacco abuse, and diverticulosis. Patient reports a one-week history of discomfort and a small bump under his bottom lip. While shaving he make this area and later noticed increased swelling and discomfort. He presented to the ER on 4/1 where IND was accomplished and the patient was discharged on Bactrim. She reports he has taken a total of 3 doses of Bactrim since discharge from the ER. He returned to the ER early in the morning hours of 4/2 with increased lower facial swelling with significant edema and the bottom lip. He was not experiencing any stridor or airway compromise.  In the ER he was normotensive and mildly febrile with a temperature of 99.6. His room air saturations were 94%.  White count was 14,700 with normal neutrophils. Patient's BUN and creatinine were elevated at 30/2.40. Baseline renal function is 18/1.58. Maxillofacial CT without contrast was completed. This revealed facial soft tissue swelling most consistent with cellulitis but this was limited assessment for abscess given noncontrast CT. No subcutaneous gas was seen. Also documented was borderline suspected lymphadenopathy in the cervical neck; incompletely imaged due to noncontrasted study but likely reactive in nature given above process. Patient's current primary complaint is of having a sore lip. Regarding his glycemic control he reports about a week ago he noticed his CBGs had climbed into the greater than 200 range but over the past 24 hours had decreased to the 150 range  Hospital Course:  Facial cellulitis/furunculosis -likely a staphylococcal infection -CT maxillofacial negative for any abscess or subcutaneous air  -No problems with chewing or swallowing at this time  -Initially placed on Vanc and zosyn, zosyn discontinued -Cellulitis improved with vanc (Day 5) -Will discharge patient on doxycycline for an additional 5 days.  Superficial thrombophlebitis-Left arm -old IV site -likely source of patient's fever on 10/10/14 evening (102.3) -LUE Doppler: No evidence of DVT in the left arm.  Appears to be superficial thrombosis in the left cephalic vein  Acute on chronic renal failure (CKD stage III) -Baseline creatinine 1.5-1.8, Cr improving -Multifactorial including volume depletion in the setting of torsemide, Bactrim, and Inspra use -d/c bactrim and Inspra -Bactrim inhibits tubular secretion of creatinine and can Mildly elevated serum creatinine  -Discontinue colchicine  Acute Diastolic CHF/Acute respiratory failure -Improving  -Chest x-ray--pulmonary vascular congestion right greater than left -Echocardiogram--EF 50-55 percent, grade 2 diastolic, severe pulm HTN -Supplemental oxygen--now  stable on RA (97%) -4/4--one dose lasix 13m IV, redose lasix 229mpo today  Severe  Pulmonary HTN -likely due to noncompliance with CPAP -has established OSA, not used CPAP x 4-5 yrs -repeat outpt polysomnogram and restart CPAP thereafter  Atypical chest pain -cycle troponin--mild elevation due to CHF and demand ischemia -now pain free -ASA to 81 mg  Diabetes mellitus type 2 -Hemoglobin A1c--7.1 -Hold glipizide- but may continue at discharge  Hypertension--uncontrolled -Monitor off of minoxidil, Inspra and torsemide -Continue clonidine and verapamil -increased dose of clonidine 0.26m tid on 4/4 -Increased hydralazine 561mTID -Will discharge with torsemide (however, patient had 2 different dosages- 100 and 10 BID, will discharge with 10101m-patient will need close follow up for HTN management.  History of tobacco use -15-pack-year history -Quit 40 years ago  OSA -need outpatient polysomnogram -Supplemental oxygen while sleeping  Diarrhea -C. difficile PCR negative -Resolved  Hypokalemia  -Replete and continue to monitor   Procedures  LUE Doppler  Consults  None  Discharge Exam: Filed Vitals:   10/13/14 1010  BP: 161/65  Pulse: 56  Temp: 98.9 F (37.2 C)  Resp: 26   Exam  General: Well developed, well nourished  HEENT: NCAT, mucous membranes moist.   Cardiovascular: S1 S2 auscultated   Respiratory: Clear to auscultation   Abdomen: Soft, nontender, nondistended, + bowel sounds  Extremities: warm dry without cyanosis clubbing or edema. Mild erythema, improving- left antecubital area  Neuro: AAOx3, nonfocal  Psych: Appropriate mood and affect  Discharge Instructions      Discharge Instructions    Discharge instructions    Complete by:  As directed   Patient will be discharged home. He should follow up with his primary care physician within one week of discharge for follow-up of cellulitis and hypertension. Patient should also have a  sleep study conducted. Patient continue a heart healthy/carb modified diet. Patient to continue his medications as prescribed.  You were cared for by a hospitalist during your hospital stay. If you have any questions about your discharge medications or the care you received while you were in the hospital after you are discharged, you can call the unit and asked to speak with the hospitalist on call if the hospitalist that took care of you is not available. Once you are discharged, your primary care physician will handle any further medical issues. Please note that NO REFILLS for any discharge medications will be authorized once you are discharged, as it is imperative that you return to your primary care physician (or establish a relationship with a primary care physician if you do not have one) for your aftercare needs so that they can reassess your need for medications and monitor your lab values.            Medication List    STOP taking these medications        colchicine 0.6 MG tablet     eplerenone 50 MG tablet  Commonly known as:  INSPRA     guaiFENesin-codeine 100-10 MG/5ML syrup  Commonly known as:  ROBITUSSIN AC     lisinopril 20 MG tablet  Commonly known as:  PRINIVIL,ZESTRIL     minoxidil 10 MG tablet  Commonly known as:  LONITEN     predniSONE 20 MG tablet  Commonly known as:  DELTASONE     sulfamethoxazole-trimethoprim 800-160 MG per tablet  Commonly known as:  SEPTRA DS      TAKE these medications        albuterol 108 (90 BASE) MCG/ACT inhaler  Commonly known as:  PROVENTIL HFA;VENTOLIN HFA  Inhale 1-2 puffs  into the lungs every 6 (six) hours as needed for wheezing or shortness of breath.     alum & mag hydroxide-simeth 200-200-20 MG/5ML suspension  Commonly known as:  MAALOX/MYLANTA  Take 30 mLs by mouth every 6 (six) hours as needed for indigestion or heartburn (dyspepsia).     aspirin 81 MG chewable tablet  Chew 1 tablet (81 mg total) by mouth daily.      cloNIDine 0.2 MG tablet  Commonly known as:  CATAPRES  Take 1 tablet (0.2 mg total) by mouth 3 (three) times daily.     docusate sodium 100 MG capsule  Commonly known as:  COLACE  Take 1 capsule (100 mg total) by mouth 2 (two) times daily.     doxycycline 50 MG capsule  Commonly known as:  VIBRAMYCIN  Take 1 capsule (50 mg total) by mouth 2 (two) times daily.     EYE DROPS OP  Apply 1 drop to eye 2 (two) times daily.     glipiZIDE 5 MG tablet  Commonly known as:  GLUCOTROL  Take 5 mg by mouth daily.     hydrALAZINE 50 MG tablet  Commonly known as:  APRESOLINE  Take 1 tablet (50 mg total) by mouth every 8 (eight) hours.     insulin glargine 100 UNIT/ML injection  Commonly known as:  LANTUS  Inject 20 Units into the skin at bedtime.     labetalol 200 MG tablet  Commonly known as:  NORMODYNE  Take 400 mg by mouth 2 (two) times daily.     torsemide 10 MG tablet  Commonly known as:  DEMADEX  Take 10 mg by mouth 2 (two) times daily.     verapamil 360 MG 24 hr capsule  Commonly known as:  VERELAN PM  Take 360 mg by mouth daily.       No Known Allergies Follow-up Information    Follow up with [facility].   Why:  Call this number to get a Primary Care Physician   Contact information:   [phone]      Follow up with Andria Frames, MD. Schedule an appointment as soon as possible for a visit in 1 week.   Specialty:  Family Medicine   Why:  Hospital follow up   Contact information:   [place] [id number] [phone]        The results of significant diagnostics from this hospitalization (including imaging, microbiology, ancillary and laboratory) are listed below for reference.    Significant Diagnostic Studies: Dg Chest 2 View  10/10/2014   CLINICAL DATA:  New onset dyspnea.  Hypoxemia.  EXAM: CHEST  2 VIEW  COMPARISON:  11/28/2013  FINDINGS: Mild enlargement of the cardiopericardial silhouette. Indistinct pulmonary vasculature, particularly on  the right. No overt edema.  Tortuous thoracic aorta with mild atherosclerotic calcification of the aortic arch.  No definite pleural effusion.  IMPRESSION: 1. Cardiomegaly with pulmonary venous hypertension. There is some asymmetry of the pulmonary vasculature with the right-sided vasculature appearing slightly more prominent than the left. If there is high clinical suspicion for pulmonary embolus thin CT angiography of the chest or nuclear medicine ventilation perfusion lung scan may be warranted. 2. Low lung volumes are present, causing crowding of the pulmonary vasculature.   Electronically Signed   By: Van Clines M.D.   On: 10/10/2014 14:57   Ct Head Wo Contrast  10/11/2014   CLINICAL DATA:  New onset headaches with uncontrolled high blood pressure.  EXAM: CT HEAD  WITHOUT CONTRAST  TECHNIQUE: Contiguous axial images were obtained from the base of the skull through the vertex without intravenous contrast.  COMPARISON:  None.  FINDINGS: Diffuse cerebral atrophy. Minimal ventricular dilatation consistent with central atrophy. Patchy low-attenuation changes in the deep white matter consistent with small vessel ischemia. No mass effect or midline shift. No abnormal extra-axial fluid collections. Gray-white matter junctions are distinct. Basal cisterns are not effaced. No evidence of acute intracranial hemorrhage. No depressed skull fractures. Mild mucosal thickening in the paranasal sinuses. Mastoid air cells are not opacified. Vascular calcifications.  IMPRESSION: No acute intracranial abnormalities. Chronic atrophy and small vessel ischemic changes.   Electronically Signed   By: Lucienne Capers M.D.   On: 10/11/2014 04:24   Ct Maxillofacial Wo Cm  10/08/2014   CLINICAL DATA:  Cut self while shaving 5 days ago with persistent lower live swelling. History of diabetes.  EXAM: CT MAXILLOFACIAL WITHOUT CONTRAST  TECHNIQUE: Multidetector CT imaging of the maxillofacial structures was performed. Multiplanar  CT image reconstructions were also generated. A small metallic BB was placed on the right temple in order to reliably differentiate right from left.  COMPARISON:  None.  FINDINGS: RIGHT greater LEFT mandible soft tissue swelling, reticulated fat without focal fluid collections on this noncontrast study, no subcutaneous gas or radiopaque foreign bodies.  Mandible is intact, the condyles are located, no facial fracture.  Small RIGHT posterior maxillary sinus mucosal retention cyst with trace paranasal sinus mucosal thickening, no air-fluid levels. Nasal septum is midline. No destructive bony lesions.  Remote RIGHT medial orbital blowout fracture. Mild proptosis bilaterally. Preservation of retrobulbar fat. Extraocular muscles are located, unremarkable. Normal appearance optic nerve sheath complexes.  Borderline suspected lymphadenopathy incompletely imaged is likely reactive.  IMPRESSION: Facial soft tissue swelling most consistent with cellulitis, limited assessment for abscess on noncontrast CT. No subcutaneous gas.  No acute facial fracture.  Borderline partially imaged cervical lymphadenopathy is likely reactive.   Electronically Signed   By: Elon Alas   On: 10/08/2014 06:20    Microbiology: Recent Results (from the past 240 hour(s))  Culture, blood (routine x 2)     Status: None (Preliminary result)   Collection Time: 10/08/14 11:21 AM  Result Value Ref Range Status   Specimen Description RIGHT ANTECUBITAL  Final   Special Requests BOTTLES DRAWN AEROBIC AND ANAEROBIC [place]  Final   Culture   Final           BLOOD CULTURE RECEIVED NO GROWTH TO DATE CULTURE WILL BE HELD FOR 5 DAYS BEFORE ISSUING A FINAL NEGATIVE REPORT Performed at [facility]    Report Status PENDING  Incomplete  Culture, blood (routine x 2)     Status: None (Preliminary result)   Collection Time: 10/08/14 11:37 AM  Result Value Ref Range Status   Specimen Description BLOOD RIGHT ARM  Final   Special Requests  BOTTLES DRAWN AEROBIC ONLY 4CC  Final   Culture   Final           BLOOD CULTURE RECEIVED NO GROWTH TO DATE CULTURE WILL BE HELD FOR 5 DAYS BEFORE ISSUING A FINAL NEGATIVE REPORT Performed at [facility]    Report Status PENDING  Incomplete  Urine culture     Status: None   Collection Time: 10/11/14  8:45 AM  Result Value Ref Range Status   Specimen Description URINE, RANDOM  Final   Special Requests NONE  Final   Colony Count NO GROWTH Performed at [facility]  Final   Culture NO GROWTH Performed at [facility]   Final   Report Status 10/12/2014 FINAL  Final  Culture, blood (routine x 2)     Status: None (Preliminary result)   Collection Time: 10/11/14  9:26 AM  Result Value Ref Range Status   Specimen Description BLOOD LEFT HAND  Final   Special Requests   Final    BOTTLES DRAWN AEROBIC AND ANAEROBIC 10CC AEROBIC,5CC ANAEROBIC   Culture   Final           BLOOD CULTURE RECEIVED NO GROWTH TO DATE CULTURE WILL BE HELD FOR 5 DAYS BEFORE ISSUING A FINAL NEGATIVE REPORT Performed at [facility]    Report Status PENDING  Incomplete  Culture, blood (routine x 2)     Status: None (Preliminary result)   Collection Time: 10/11/14  9:35 AM  Result Value Ref Range Status   Specimen Description BLOOD RIGHT ANTECUBITAL  Final   Special Requests BOTTLES DRAWN AEROBIC AND ANAEROBIC 10CC EACH  Final   Culture   Final           BLOOD CULTURE RECEIVED NO GROWTH TO DATE CULTURE WILL BE HELD FOR 5 DAYS BEFORE ISSUING A FINAL NEGATIVE REPORT Performed at [facility]    Report Status PENDING  Incomplete  Clostridium Difficile by PCR     Status: None   Collection Time: 10/11/14 11:41 PM  Result Value Ref Range Status   C difficile by pcr NEGATIVE NEGATIVE Final     Labs: Basic Metabolic Panel:  Recent Labs Lab 10/09/14 0606 10/10/14 0416 10/11/14 0926 10/12/14 0402 10/13/14 0530  NA 140 139 140 140 138  K 3.7 3.6 3.0* 3.3* 3.6  CL 108 109  105 104 106  CO2 _0 GLUCOSE 115* 107* 156* 131* 112*  BUN 29* _1 CREATININE 2.09* 1.53* 1.52* 1.38* 1.51*  CALCIUM 8.2* 8.5 9.0 9.4 9.4   Liver Function Tests: No results for input(s): AST, ALT, ALKPHOS, BILITOT, PROT, ALBUMIN in the last 168 hours. No results for input(s): LIPASE, AMYLASE in the last 168 hours. No results for input(s): AMMONIA in the last 168 hours. CBC:  Recent Labs Lab 10/08/14 0520  10/09/14 0606 10/10/14 0416 10/11/14 0926 10/12/14 0402 10/13/14 0530  WBC 14.7*  --  11.4* 11.3* 15.1* 11.7* 11.7*  NEUTROABS 9.4*  --   --   --   --   --   --   HGB 11.8*  < > 10.5* 11.1* 11.0* 12.4* 12.4*  HCT 36.1*  < > 32.5* 33.8* 33.2* 36.7* 37.6*  MCV 93.5  --  93.7 92.3 91.0 89.5 90.8  PLT 167  --  147* 149* 174 199 220  < > = values in this interval not displayed. Cardiac Enzymes:  Recent Labs Lab 10/10/14 1145 10/10/14 1612 10/10/14 2305  TROPONINI 0.06* 0.06* 0.07*   BNP: BNP (last 3 results) No results for input(s): BNP in the last 8760 hours.  ProBNP (last 3 results)  Recent Labs  11/28/13 1300  PROBNP 381.3*    CBG:  Recent Labs Lab 10/12/14 1151 10/12/14 1656 10/12/14 2145 10/13/14 0753 10/13/14 1202  GLUCAP 187* 174* 162* 91 160*       Signed:  Patrcia Schnepp  [facility] 10/13/2014, 2:01 PM

## 2014-10-14 LAB — CULTURE, BLOOD (ROUTINE X 2)
Culture: NO GROWTH
Culture: NO GROWTH

## 2014-10-17 LAB — CULTURE, BLOOD (ROUTINE X 2)
Culture: NO GROWTH
Culture: NO GROWTH

## 2014-10-18 ENCOUNTER — Encounter: Payer: Self-pay | Admitting: Internal Medicine

## 2014-10-18 DIAGNOSIS — I5032 Chronic diastolic (congestive) heart failure: Secondary | ICD-10-CM | POA: Insufficient documentation

## 2014-10-18 DIAGNOSIS — D649 Anemia, unspecified: Secondary | ICD-10-CM

## 2014-10-18 DIAGNOSIS — N183 Chronic kidney disease, stage 3 (moderate): Secondary | ICD-10-CM

## 2014-10-18 DIAGNOSIS — I422 Other hypertrophic cardiomyopathy: Secondary | ICD-10-CM | POA: Insufficient documentation

## 2014-10-18 DIAGNOSIS — I130 Hypertensive heart and chronic kidney disease with heart failure and stage 1 through stage 4 chronic kidney disease, or unspecified chronic kidney disease: Secondary | ICD-10-CM | POA: Insufficient documentation

## 2014-10-18 DIAGNOSIS — I2721 Secondary pulmonary arterial hypertension: Secondary | ICD-10-CM | POA: Insufficient documentation

## 2014-10-18 DIAGNOSIS — E1122 Type 2 diabetes mellitus with diabetic chronic kidney disease: Secondary | ICD-10-CM

## 2014-10-18 DIAGNOSIS — I421 Obstructive hypertrophic cardiomyopathy: Secondary | ICD-10-CM | POA: Insufficient documentation

## 2014-10-18 DIAGNOSIS — I272 Pulmonary hypertension, unspecified: Secondary | ICD-10-CM | POA: Insufficient documentation

## 2014-10-18 DIAGNOSIS — E669 Obesity, unspecified: Secondary | ICD-10-CM

## 2014-10-18 HISTORY — DX: Secondary pulmonary arterial hypertension: I27.21

## 2014-10-18 HISTORY — DX: Other hypertrophic cardiomyopathy: I42.2

## 2014-10-18 HISTORY — DX: Anemia, unspecified: D64.9

## 2014-10-18 HISTORY — DX: Obstructive hypertrophic cardiomyopathy: I42.1

## 2014-10-18 HISTORY — DX: Hypertensive heart and chronic kidney disease with heart failure and stage 1 through stage 4 chronic kidney disease, or unspecified chronic kidney disease: I13.0

## 2014-10-18 HISTORY — DX: Chronic diastolic (congestive) heart failure: I50.32

## 2014-10-19 ENCOUNTER — Ambulatory Visit (INDEPENDENT_AMBULATORY_CARE_PROVIDER_SITE_OTHER): Payer: Medicare Other | Admitting: Internal Medicine

## 2014-10-19 ENCOUNTER — Encounter: Payer: Self-pay | Admitting: Internal Medicine

## 2014-10-19 VITALS — BP 164/70 | HR 93 | Temp 98.3°F | Wt 205.9 lb

## 2014-10-19 DIAGNOSIS — Z794 Long term (current) use of insulin: Secondary | ICD-10-CM

## 2014-10-19 DIAGNOSIS — M1A9XX0 Chronic gout, unspecified, without tophus (tophi): Secondary | ICD-10-CM

## 2014-10-19 DIAGNOSIS — E669 Obesity, unspecified: Secondary | ICD-10-CM | POA: Diagnosis not present

## 2014-10-19 DIAGNOSIS — I129 Hypertensive chronic kidney disease with stage 1 through stage 4 chronic kidney disease, or unspecified chronic kidney disease: Secondary | ICD-10-CM | POA: Diagnosis not present

## 2014-10-19 DIAGNOSIS — M1000 Idiopathic gout, unspecified site: Secondary | ICD-10-CM | POA: Diagnosis not present

## 2014-10-19 DIAGNOSIS — E11329 Type 2 diabetes mellitus with mild nonproliferative diabetic retinopathy without macular edema: Secondary | ICD-10-CM | POA: Diagnosis present

## 2014-10-19 DIAGNOSIS — D649 Anemia, unspecified: Secondary | ICD-10-CM | POA: Diagnosis not present

## 2014-10-19 DIAGNOSIS — I272 Other secondary pulmonary hypertension: Secondary | ICD-10-CM

## 2014-10-19 DIAGNOSIS — N183 Chronic kidney disease, stage 3 unspecified: Secondary | ICD-10-CM

## 2014-10-19 DIAGNOSIS — G4733 Obstructive sleep apnea (adult) (pediatric): Secondary | ICD-10-CM

## 2014-10-19 DIAGNOSIS — I10 Essential (primary) hypertension: Secondary | ICD-10-CM

## 2014-10-19 DIAGNOSIS — E119 Type 2 diabetes mellitus without complications: Secondary | ICD-10-CM

## 2014-10-19 DIAGNOSIS — E1122 Type 2 diabetes mellitus with diabetic chronic kidney disease: Secondary | ICD-10-CM

## 2014-10-19 DIAGNOSIS — Z9989 Dependence on other enabling machines and devices: Secondary | ICD-10-CM

## 2014-10-19 DIAGNOSIS — Z87891 Personal history of nicotine dependence: Secondary | ICD-10-CM | POA: Diagnosis not present

## 2014-10-19 DIAGNOSIS — M109 Gout, unspecified: Secondary | ICD-10-CM | POA: Insufficient documentation

## 2014-10-19 LAB — URIC ACID: Uric Acid, Serum: 7.1 mg/dL (ref 4.0–7.8)

## 2014-10-19 LAB — HM DIABETES EYE EXAM

## 2014-10-19 MED ORDER — HYDRALAZINE HCL 50 MG PO TABS: 50.0000 mg | ORAL_TABLET | Freq: Three times a day (TID) | ORAL | Status: DC

## 2014-10-19 MED ORDER — CLONIDINE HCL 0.2 MG PO TABS
0.2000 mg | ORAL_TABLET | Freq: Three times a day (TID) | ORAL | Status: DC
Start: 2014-10-19 — End: 2014-12-08

## 2014-10-19 MED ORDER — INSULIN GLARGINE 100 UNIT/ML ~~LOC~~ SOLN: 20.0000 [IU] | Freq: Every day | SUBCUTANEOUS | Status: DC

## 2014-10-19 MED ORDER — TORSEMIDE 10 MG PO TABS: 10.0000 mg | ORAL_TABLET | Freq: Two times a day (BID) | ORAL | Status: DC

## 2014-10-19 NOTE — Assessment & Plan Note (Signed)
Has had 4-5 attacks over past few yrs. Describes podagra and sheet not being able to touch toe. No crystal dx. Check Uric acid level today. We discussed prophylaxis and he will think about. Would qualify 2/2 # attacks and Renal Failure.

## 2014-10-19 NOTE — Assessment & Plan Note (Signed)
I showed him Cr graph and stable since 2010. RF mgmt.

## 2014-10-19 NOTE — Assessment & Plan Note (Signed)
Lab Results  Component Value Date   HGBA1C 7.1* 10/10/2014    He is on lantus 20 QD and glucotrol 5 QD. He checks his CBG about QOD. Usually about 150 fasting by pt report. Denies lows. Had been on metformin but pt believes Dr Justin Mend took him off of it 2/2 GI issues. If his fasting is 150, his post prandial are likely much higher which wouldn't correlate with an A1C of 7.1. Follow for now on current meds.  Foot and eye exam today along with microalb.

## 2014-10-19 NOTE — Patient Instructions (Signed)
1. It was great to meet you. 2. Check your blood pressure and write it down and bring to next appt 3. Think about a daily pill to prevent gout. 4. I am checking gout and blood pressure blood work. 5. Get the sleep study

## 2014-10-19 NOTE — Assessment & Plan Note (Signed)
He has a 12 min walk from car to take to fish and has to rest during walk. Also profuse diaphoresis. Likely multifactorial. Control RF.

## 2014-10-19 NOTE — Assessment & Plan Note (Addendum)
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 Will need to add on ferritin next blood draw to further characterize anemia.

## 2014-10-19 NOTE — Assessment & Plan Note (Signed)
Understands need for repeat sleep study and willing to take. Likes to sleep on stomach which is issue with CPAP. No AM HA but does fall asleep during day and has couple times while driving, but not at red lights. This is likely cause for severe Pul HTN.

## 2014-10-19 NOTE — Assessment & Plan Note (Signed)
BP Readings from Last 3 Encounters:  10/19/14 164/70  10/13/14 161/65  10/07/14 144/61   Current meds are Clonidine 0.2 TID Hydralazine 50 Q8 Labetalol 400 BID (out of couple of days) Torsemide 10 BID Verapamil 360 QD Minoxidil 10 2 QD Eplerenone 50 QD  Despite all these meds, he remains above goal. I believe he is complaint - had all med bottles, reason written on med bottle, and new meds. He doesn't recall being checked for secondary causes so I will start with ARR. Typically stop Eplerenone 6 weeks prior to ARR testing. However, his K is 3.6 despite this med and therefore, UTD states ARR might be useful despite cont the med. Next step would to be consider W/U RAS. Doubt pheo - no other sxs.  Goal would be <150/90 although I would prefer to be more aggressive 9130/90) due to multiple RF and severe Pul HTN.   Referral to [facility] to see if can get BP monitor for home BP measurement.  To return in June with BP log. Thoughts are that most meds are not at dose. Would prefer to stop Clonidine and max Verapamil. Getting Dr Jason Nest records as thinks he was on ACEI in past as could restart that as DM and stable Cr. Could change Labetolol to Toprol XL to decrease pill burden. Could also increase Eplerenone.

## 2014-10-19 NOTE — Assessment & Plan Note (Signed)
No Left HF. Starting with sleep study to grade severity os OSA as that is likely cause. HIV -. No vol overload currently and the RV per ECHO is still nl.

## 2014-10-19 NOTE — Progress Notes (Signed)
Subjective:    Patient ID: Dylan [name], male    DOB: 1946/04/19, 69 y.o.   MRN: [id number]  HPI  Dylan [name] is here to est care for his chronic medical illnesses. Please see the A&P for the status of the pt's chronic medical problems.   Review of Systems  Constitutional: Positive for diaphoresis. Negative for unexpected weight change.  HENT: Negative for rhinorrhea and sinus pressure.   Eyes: Negative for discharge.  Respiratory: Positive for shortness of breath.   Cardiovascular: Negative for chest pain.       H/o ankle swelling, not currently  Gastrointestinal: Negative for nausea, vomiting, abdominal pain, diarrhea, constipation and blood in stool.  Musculoskeletal:       H/O podagra, not currently  Skin: Positive for wound. Negative for rash.  Neurological: Negative for headaches.  Psychiatric/Behavioral: Positive for sleep disturbance.       Objective:   Physical Exam  Constitutional: He is oriented to person, place, and time. He appears well-developed and well-nourished. No distress.  HENT:  Head: Normocephalic and atraumatic.  Right Ear: External ear normal.  Left Ear: External ear normal.  Nose: Nose normal.  Mouth/Throat: Oropharynx is clear and moist. No oropharyngeal exudate.  Crowded oropharynx.   Eyes: Conjunctivae and EOM are normal.  Cardiovascular: Normal rate, regular rhythm and normal heart sounds.   Pulmonary/Chest: Effort normal and breath sounds normal.  Musculoskeletal: Normal range of motion. He exhibits no edema.  Neurological: He is alert and oriented to person, place, and time.  Skin: Skin is warm and dry. He is not diaphoretic. No erythema.  Psychiatric: He has a normal mood and affect. His behavior is normal. Judgment and thought content normal.    Social hx, allergies, & PMHx updated in EPIC      Assessment & Plan:

## 2014-10-19 NOTE — Assessment & Plan Note (Signed)
Was given alb inhaler months ago but doesn't require. Stopped smoking yrs ago. 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 If Sleep study not severe to explain pul HTN, will get PFT's.

## 2014-10-20 ENCOUNTER — Encounter: Payer: Self-pay | Admitting: Internal Medicine

## 2014-10-20 ENCOUNTER — Telehealth: Payer: Self-pay | Admitting: Licensed Clinical Social Worker

## 2014-10-20 DIAGNOSIS — I10 Essential (primary) hypertension: Secondary | ICD-10-CM

## 2014-10-20 LAB — MICROALBUMIN / CREATININE URINE RATIO
Creatinine, Urine: 227.8 mg/dL
Microalb Creat Ratio: 20.6 mg/g (ref 0.0–30.0)
Microalb, Ur: 4.7 mg/dL — ABNORMAL HIGH (ref <2.0)

## 2014-10-20 NOTE — Telephone Encounter (Signed)
Dylan [name] was referred to CSW for assistance with obtaining a BP cuff for home use.  Pt has traditional Medicare and is eligible for TH[employer].  Discussed with [facility] liaison and pt may qualify for financial assistance for bp cuff.  CSW placed called to pt unable to leave message, no answer.  CSW will discuss referral to [facility] with Dylan [name].

## 2014-10-21 ENCOUNTER — Telehealth: Payer: Self-pay | Admitting: *Deleted

## 2014-10-21 LAB — ALDOSTERONE + RENIN ACTIVITY W/ RATIO
ALDO / PRA Ratio: 23.5 (ref 0.0–30.0)
Aldosterone: 36.7 ng/dL — ABNORMAL HIGH (ref 0.0–30.0)
PRA LC/MS/MS: 1.56 ng/mL/hr

## 2014-10-21 MED ORDER — INSULIN GLARGINE 100 UNIT/ML SOLOSTAR PEN: 20.0000 [IU] | PEN_INJECTOR | Freq: Every day | SUBCUTANEOUS | Status: DC

## 2014-10-21 NOTE — Telephone Encounter (Signed)
Pls apologize to the pt. The pharm tech had entered his Lantus incorrectly. I sent in 5 pens with refills. Is that the quantity he is used to getting?

## 2014-10-21 NOTE — Telephone Encounter (Signed)
CSW placed called to pt. Family/Friend states pt will return home shortly.

## 2014-10-21 NOTE — Telephone Encounter (Signed)
Call from pt's friend, Daneil Dolin - recent Lantus rx was for vials not pens.  Pt uses pens; please send rx. Thanks

## 2014-10-21 NOTE — Telephone Encounter (Signed)
Dylan [name] returned call to [place] but was actually following up regarding his insulin prescription.  CSW informed Dylan [name] triage is aware and routed note to Dylan [name] who has made note.  CSW discussed referral to [facility] for SW to complete financial assessment to determine if resources are available to assist Dylan [name] with a bp cuff.  Pt agreeable to referral.

## 2014-10-21 NOTE — Telephone Encounter (Signed)
Called [place], who helps pt with his meds/appt, - she will let pt know about Lantus pens.

## 2014-10-24 ENCOUNTER — Encounter: Payer: Self-pay | Admitting: *Deleted

## 2014-11-01 ENCOUNTER — Telehealth: Payer: Self-pay

## 2014-11-01 NOTE — Patient Outreach (Signed)
[facility]) Care Management  11/01/2014  Dylan [name] 30-Jan-1946 [id number]  Triage Screening Outreach Call #1.  Patient reached. Referral Date:  10/24/2014 Referral Source:  [facility] Issue:   Dylan [name] referral for SW to complete financial assessment to determine if resources are available to assist Dylan [name] with a BP cuff. H/o HTN, DM, HF Insurance:  [name], [place] A&B Admissions:  1   10/08/2014 - 10/13/2014 cellulitis and hypertension ED Visits: 1  PCP:  Dr. Larey Dresser, [facility].      Home BP Cuff:  No home BP cuff and unable to afford to purchase.  RN CM instructed patient to contact the customer service # on his [employer] card tomorrow; request benefits to discuss coverage:  Will my insurance cover a home BP cuff if I have been diagnosed with HTN, DM, HF and my MD has prescribed home BP monitoring? RN CM advised patient to contact RN CM with outcome so that RN CM can provide additional options.  Barrier to care:  Financial    Other issues discussed this call:  OSA Patient states h/o OSA and having a CPap machine in 2010.  Did not like to use and got rid of the machine.  Patient states he is willing to try again following discussion with his MD and MD recommendations.  States OSA sleep study scheduled for Dec 01, 2014.  RN CM instructed that once study is complete; MD order for readings required for OSA management will be sent to CPAP provider who will request authorization and coverage information from health plan: [employer].  RN CM instructed that patient may also contact the provider to ask this question.  Advised to not accept equipment into the home until coverage details explained to patient to avoid any surprises. Barrier to care:  Financial    Medications -Patient states his insulin co-pay cost is $130.00 and MD recently ordered the vial rather than the epi pens and the co-pay was only $80.00.   Patient states this was good to save $50.00 / month in cost but he did not know how to draw up the medication.  Patient notified his MD who corrected the order back to pens as patient requested. -Patient states he travels to [place] on occasion and ask "How long is it safe for me to leave my insulin out of the refrigeration?"  "Can this change the how well my medicine works?" -other co-pay cost are $18.00 and $30.00  RN CM advised it is necessary to travel with a small cooler in order to keep needed meds at the needed temperature. RN CM advised yes medication effectiveness can be altered if meds are not maintained at recommended temperatures. RN CM instructed in resource of asking any questions to his local pharmacist at his drug store who is always happy to answer any questions / concerns.   RN CM encouraged patient to utilize his resources (pharmacist) who are always willing to answer questions/concerns.  Knowledge deficit relating to self dosing insulin using medication vials.   Patient would benefit from Pharmacy Consult:  Medication Reconciliation and to address patient's questions.   MD Visits Co-pay cost Issues:  Patient states he was seeing Primary MD:  Dr. Ronnald Ramp but MD refused to see patient any longer for office visits due to owing the practice about $300.00.  Patient believes charges to be related to co-pays.  States he was never ask for a co-pay and was not  aware he was running up a bill.  Patient states since his last IP admission 10/08/2014 - 10/13/2014 cellulitis and hypertension; he is now seeing Bartholomew Crews, MD, [name]. Barrier to care:  Financial Patient would benefit from SW referral for Financial Assessment    Patient ineligible for [facility] services.  [facility] is not in network with patients insurance:  [name].  Patient in need of care coordination services.   Plan:   RN CM will assess for other resources for patient such as home health (if home bound) and  [employer] CM  services.   RN CM will review outcome to BP cuff benefit once patient reports back to RN CM (patient to call his plan on 11/02/2014 and report outcome to RN CM).  Patient would benefit from RN for Education and teaching of insulin injectables,  SW referral for Financial Assessment, Pharmacy Referral for medication reconciliation, review for any possible med changes to assist with cost management  and answer medication questions.    [name], RN, BSN, [facility], CCM  [employer] Management Coordinator [phone] Office [phone] Direct [phone] Cell

## 2014-11-08 ENCOUNTER — Telehealth: Payer: Self-pay

## 2014-11-08 ENCOUNTER — Encounter: Payer: Self-pay | Admitting: Internal Medicine

## 2014-11-08 DIAGNOSIS — E11319 Type 2 diabetes mellitus with unspecified diabetic retinopathy without macular edema: Secondary | ICD-10-CM | POA: Insufficient documentation

## 2014-11-08 NOTE — Patient Outreach (Signed)
[name]) Care Management  11/08/2014  Dylan [name] 06-20-1946   Inbound call from patient with update on contacting [employer] for coverage on BP cuff.   Patient states [name] confirms home BP cuff is not a covered item.    Patient in need of care coordination services. Patient is not home bound and does not qualify for Home Health services.  RN CM extended offer for home visit even though patient is not eligible for services.   Patient refused [name] visit to provide education on drawing up insulin.   Patient states he will continue to use prefilled syringes until he sees his MD on the next MD appt.  States he will request a change in insulin to vials in order to save $50.00 / month in cost of medication.  States he feels his MD can teach him what he needs to know at this time.  Patient agreed with RN CM that one way to cut cost of care / medication cost would be to change for pre-filled insulin pens to a vial of insulin to self draw.   RN CM advised in options of self purchasing a BP cuff and decreasing cost of medications will free up money to cover other health care needs. RN CM encouraged to continue to self check his feet and report any changes in condition to primary MD as needed.  Encouraged to call and request earlier appt if needed to address the above.   Case closed to Telephonic RN CM services:   Patient ineligible for [name] services. [facility] is not in network with patients insurance: [name].   [name], RN, BSN, [facility], [place] [facility] Care Management Coordinator [phone] Office [phone] Direct [phone] Cell [phone]

## 2014-11-09 NOTE — Patient Outreach (Signed)
Dylan [name]) Care Management  11/09/2014  Dylan [name] [birth year] [id number]   Received notification from Dylan Laster, RN to close patient as patient in not eligible for [place].  Dylan [name]. [place] CM Assistant Phone: [phone] Fax: [phone]

## 2014-11-11 DIAGNOSIS — N2581 Secondary hyperparathyroidism of renal origin: Secondary | ICD-10-CM | POA: Diagnosis not present

## 2014-11-11 DIAGNOSIS — E1122 Type 2 diabetes mellitus with diabetic chronic kidney disease: Secondary | ICD-10-CM | POA: Diagnosis not present

## 2014-11-11 DIAGNOSIS — N183 Chronic kidney disease, stage 3 (moderate): Secondary | ICD-10-CM | POA: Diagnosis not present

## 2014-11-11 DIAGNOSIS — E785 Hyperlipidemia, unspecified: Secondary | ICD-10-CM | POA: Diagnosis not present

## 2014-11-16 ENCOUNTER — Telehealth: Payer: Self-pay | Admitting: Dietician

## 2014-11-16 NOTE — Telephone Encounter (Signed)
6 month supply Rx 10/2014

## 2014-11-16 NOTE — Telephone Encounter (Signed)
Patient was put through on CDE phone. He asked for refill on: Hydralazine 50 mg  Asked him to call his pharmacy for refills in the future

## 2014-11-23 ENCOUNTER — Ambulatory Visit: Payer: Self-pay | Admitting: Internal Medicine

## 2014-11-28 ENCOUNTER — Other Ambulatory Visit: Payer: Self-pay | Admitting: Dietician

## 2014-11-28 DIAGNOSIS — I10 Essential (primary) hypertension: Secondary | ICD-10-CM

## 2014-11-28 MED ORDER — EPLERENONE 50 MG PO TABS: 50.0000 mg | ORAL_TABLET | Freq: Every day | ORAL | Status: DC

## 2014-11-28 MED ORDER — MINOXIDIL 10 MG PO TABS: 20.0000 mg | ORAL_TABLET | Freq: Two times a day (BID) | ORAL | Status: DC

## 2014-11-28 NOTE — Telephone Encounter (Signed)
Has June appt for f/U Hard to control HTN

## 2014-12-08 ENCOUNTER — Telehealth: Payer: Self-pay | Admitting: *Deleted

## 2014-12-08 ENCOUNTER — Ambulatory Visit (INDEPENDENT_AMBULATORY_CARE_PROVIDER_SITE_OTHER): Payer: Medicare Other | Admitting: Internal Medicine

## 2014-12-08 ENCOUNTER — Ambulatory Visit: Payer: Medicare Other | Admitting: Pharmacist

## 2014-12-08 ENCOUNTER — Ambulatory Visit (HOSPITAL_COMMUNITY)
Admission: RE | Admit: 2014-12-08 | Discharge: 2014-12-08 | Disposition: A | Discharge status: Home / Self Care | Payer: Medicare Other | Source: Ambulatory Visit | Attending: Internal Medicine | Admitting: Internal Medicine

## 2014-12-08 VITALS — BP 171/60 | HR 70 | Temp 98.0°F | Wt 214.4 lb

## 2014-12-08 DIAGNOSIS — I10 Essential (primary) hypertension: Secondary | ICD-10-CM

## 2014-12-08 DIAGNOSIS — M7989 Other specified soft tissue disorders: Secondary | ICD-10-CM | POA: Insufficient documentation

## 2014-12-08 DIAGNOSIS — I129 Hypertensive chronic kidney disease with stage 1 through stage 4 chronic kidney disease, or unspecified chronic kidney disease: Secondary | ICD-10-CM | POA: Diagnosis not present

## 2014-12-08 DIAGNOSIS — E1122 Type 2 diabetes mellitus with diabetic chronic kidney disease: Secondary | ICD-10-CM

## 2014-12-08 DIAGNOSIS — Z79899 Other long term (current) drug therapy: Secondary | ICD-10-CM

## 2014-12-08 DIAGNOSIS — Z9989 Dependence on other enabling machines and devices: Secondary | ICD-10-CM

## 2014-12-08 DIAGNOSIS — M1000 Idiopathic gout, unspecified site: Secondary | ICD-10-CM

## 2014-12-08 DIAGNOSIS — Z794 Long term (current) use of insulin: Secondary | ICD-10-CM | POA: Diagnosis not present

## 2014-12-08 DIAGNOSIS — I5032 Chronic diastolic (congestive) heart failure: Secondary | ICD-10-CM | POA: Diagnosis not present

## 2014-12-08 DIAGNOSIS — I272 Other secondary pulmonary hypertension: Secondary | ICD-10-CM

## 2014-12-08 DIAGNOSIS — N183 Chronic kidney disease, stage 3 unspecified: Secondary | ICD-10-CM

## 2014-12-08 DIAGNOSIS — E669 Obesity, unspecified: Secondary | ICD-10-CM

## 2014-12-08 DIAGNOSIS — Z6833 Body mass index (BMI) 33.0-33.9, adult: Secondary | ICD-10-CM

## 2014-12-08 DIAGNOSIS — R600 Localized edema: Secondary | ICD-10-CM

## 2014-12-08 DIAGNOSIS — D649 Anemia, unspecified: Secondary | ICD-10-CM

## 2014-12-08 DIAGNOSIS — M109 Gout, unspecified: Secondary | ICD-10-CM | POA: Diagnosis not present

## 2014-12-08 DIAGNOSIS — G4733 Obstructive sleep apnea (adult) (pediatric): Secondary | ICD-10-CM

## 2014-12-08 MED ORDER — INSULIN DETEMIR 100 UNIT/ML FLEXPEN: 20.0000 [IU] | PEN_INJECTOR | Freq: Every day | SUBCUTANEOUS | Status: DC

## 2014-12-08 MED ORDER — CLONIDINE HCL 0.1 MG PO TABS
0.1000 mg | ORAL_TABLET | Freq: Three times a day (TID) | ORAL | Status: DC
Start: 2014-12-08 — End: 2015-03-20

## 2014-12-08 MED ORDER — VERAPAMIL HCL ER 360 MG PO CP24: 360.0000 mg | ORAL_CAPSULE | Freq: Every day | ORAL | Status: DC

## 2014-12-08 MED ORDER — GLIPIZIDE 5 MG PO TABS: 5.0000 mg | ORAL_TABLET | Freq: Every day | ORAL | Status: DC

## 2014-12-08 MED ORDER — MINOXIDIL 10 MG PO TABS: 20.0000 mg | ORAL_TABLET | Freq: Every day | ORAL | Status: DC

## 2014-12-08 MED ORDER — EPLERENONE 50 MG PO TABS: 50.0000 mg | ORAL_TABLET | Freq: Every day | ORAL | Status: DC

## 2014-12-08 MED ORDER — TORSEMIDE 10 MG PO TABS
10.0000 mg | ORAL_TABLET | Freq: Two times a day (BID) | ORAL | Status: DC
Start: 2014-12-08 — End: 2014-12-16

## 2014-12-08 MED ORDER — ALBUTEROL SULFATE HFA 108 (90 BASE) MCG/ACT IN AERS: 1.0000 | INHALATION_SPRAY | Freq: Four times a day (QID) | RESPIRATORY_TRACT | Status: DC | PRN

## 2014-12-08 MED ORDER — HYDRALAZINE HCL 50 MG PO TABS
50.0000 mg | ORAL_TABLET | Freq: Three times a day (TID) | ORAL | Status: DC
Start: 2014-12-08 — End: 2015-07-18

## 2014-12-08 MED ORDER — LABETALOL HCL 200 MG PO TABS: 400.0000 mg | ORAL_TABLET | Freq: Two times a day (BID) | ORAL | Status: DC

## 2014-12-08 NOTE — Assessment & Plan Note (Signed)
Noted on ECHO 2016. Needs better BP control.

## 2014-12-08 NOTE — Assessment & Plan Note (Signed)
2 weeks new onset R LE edema without pain. No travel / immobility. Sister is on blood thinner he thinks bc of clot. No personal h/o VTE. Will check for DVT since unilateral and new.

## 2014-12-08 NOTE — Progress Notes (Addendum)
S:    Dylan [name] is a 69 y.o. male reports for help with HTN medications. Patient did bring medication bottles.   Patient reports no known adherence challenges but does have concerns with medication costs. HTN regimen includes minoxidil, clonidine, eplerenone, hydralazine, torsemide, verapamil, labetalol.   Allergies  Allergen Reactions  . Spironolactone Other (See Comments)    Gynecomastia per pt report.   A/P: Medications were reviewed with the patient, including name, instructions, indication, goals of therapy, potential side effects, importance of adherence, and safe use. Patient verbalized understanding and advised to contact me if questions arise.  Potential medication management strategies:  Taper/discontinue clonidine and minoxidil  Increase doses of eplerenone and hydralazine  Add an ACE or ARB (losartan is a good option due to uricosuric effect)  Switch torsemide 10 mg BID to furosemide 20 mg BID  Patient also mentioned cost of Lantus, so recommended switching to Levemir.  Plan formulated with PCP. Will provide further assistance as needed. Thank you for including me in the care of your patient.  Flossie Dibble Clinical Pharmacist  25 minutes spent face-to-face with the patient during the encounter. 50% of time spent on education. 50% of time was spent on assessment, exploration of benefits, and plan.

## 2014-12-08 NOTE — Progress Notes (Signed)
VASCULAR LAB PRELIMINARY  PRELIMINARY  PRELIMINARY  PRELIMINARY  Right lower extremity venous duplex completed.    Preliminary report:  Right:  No evidence of DVT, superficial thrombosis, or Baker's cyst.  Dylan [name], RVS 12/08/2014, 3:12 PM

## 2014-12-08 NOTE — Assessment & Plan Note (Addendum)
Checks CBG about a couple times a week. Fasting. Ranging btw 119 - 159, average of 177. A1C 7.1 2 months ago. On glucotrol 5 QD and lantus 20 QD. UTD on all measures.  Change lantus (tier 3) to levemir (tier 2)

## 2014-12-08 NOTE — Progress Notes (Signed)
   Subjective:    Patient ID: Dylan [name], male    DOB: 1946-04-11, 69 y.o.   MRN: [id number]  HPI  [place] is here for HTN F/U. Please see the A&P for the status of the pt's chronic medical problems.   Review of Systems  HENT: Positive for congestion and rhinorrhea.        Can't breath through nose at night  Eyes: Positive for itching.  Cardiovascular: Positive for leg swelling.  Gastrointestinal: Positive for constipation.  Musculoskeletal: Positive for myalgias.  Skin: Positive for wound.       Objective:   Physical Exam  Constitutional: He is oriented to person, place, and time. He appears well-developed and well-nourished. No distress.  HENT:  Head: Normocephalic and atraumatic.  Right Ear: External ear normal.  Left Ear: External ear normal.  Nose: Nose normal.  Eyes: Conjunctivae and EOM are normal.  Cardiovascular: Normal rate and regular rhythm.   Murmur heard. Short systolic murmur L sternal border  Pulmonary/Chest: Effort normal and breath sounds normal. No respiratory distress.  Musculoskeletal: Normal range of motion. He exhibits edema.  + 1 edema RLE to knee. No edema on L  Neurological: He is alert and oriented to person, place, and time.  Skin: Skin is warm and dry. No rash noted. He is not diaphoretic. No erythema.  Psychiatric: He has a normal mood and affect. His behavior is normal. Judgment and thought content normal.          Assessment & Plan:

## 2014-12-08 NOTE — Assessment & Plan Note (Signed)
Start with sleep study. If not bad, will need PFT's.

## 2014-12-08 NOTE — Assessment & Plan Note (Signed)
I am getting Dr Jason Nest note from recent visit. Did not repeat labs today as just obtained by Dr Justin Mend.

## 2014-12-08 NOTE — Assessment & Plan Note (Signed)
Get Dr Jason Nest notes.

## 2014-12-08 NOTE — Assessment & Plan Note (Signed)
Uric acid 7.1. Will not start preventive tx as goal now is better BP control and might be able to start losartan.

## 2014-12-08 NOTE — Assessment & Plan Note (Signed)
Has sleep study sch June 26th. Congested at night only and has to breath through mouth. Will await results.

## 2014-12-08 NOTE — Patient Instructions (Signed)
1. Any time your blood pressure is checked please write it down and bring in to the appointment. 2. I am checking your Right leg for a clot 3. Keep your legs elevated whenever you are resting 4. If the swelling gets worse, you can try compression stockings to squeeze the fluid out  5. See me in 1-2 months 6. Your medicines will be sent to [facility] by Friday evening.

## 2014-12-08 NOTE — Telephone Encounter (Signed)
Call made to [facility] message on recorder for Dylan [name], requesting any office visits dated after 08/31/2012 along with most recent labs. Regenia Skeeter, Carlee Tesfaye Cassady6/2/201611:01 AM

## 2014-12-08 NOTE — Assessment & Plan Note (Addendum)
This is his biggest issue. Med regimen is   Clonidine 0.1 TID (last time was 0.2 - said used up that bottle and had the 0.1) Hydral 50 Q 8 hrs Labetolol 400 BID Toresmide 10 BID Verapamil 360 QD Minosodil 10 w QD Eplererone 50 QD  BP not controlled.  BP Readings from Last 3 Encounters:  10/19/14 164/70  10/13/14 161/65  10/07/14 144/61   Today was 164/70. [facility] not able to provide a BP cuff.  PLAN 1. Get Dr Jason Nest note 2. Have him write down BP at pharmacy 3. He is working on getting home BP cuff 4. Plan would be to increase epleronone or verapamil. I would like to further decrease clonidine but can't now 2/2 uncontrolled BP 5. May change torsemide to lasix 20 BID (tier 2 instead of 3) 6. See if can start ACE or ARB 7. If can get off clonidine, then might stop minoxidil (tier 3)    BP Dr Jason Nest office 11/11/14 140/60 and at goal per Dr Justin Mend. Needs home BP monitoring

## 2014-12-08 NOTE — Assessment & Plan Note (Signed)
His BMI is 33. He is being checked for OSA. My goal now is BP control, then will work on other issues.

## 2014-12-09 ENCOUNTER — Other Ambulatory Visit: Payer: Self-pay | Admitting: Internal Medicine

## 2014-12-12 ENCOUNTER — Encounter: Payer: Self-pay | Admitting: Internal Medicine

## 2014-12-12 DIAGNOSIS — Z0000 Encounter for general adult medical examination without abnormal findings: Secondary | ICD-10-CM | POA: Insufficient documentation

## 2014-12-13 ENCOUNTER — Telehealth: Payer: Self-pay | Admitting: *Deleted

## 2014-12-13 NOTE — Telephone Encounter (Signed)
Pt called - no change with swelling of legs. Pt denies pain. Pt has doubled up on fluid pill and wearing AE stockings continuously - no change with legs. Appt made 12/15/14 9:45AM Dr Lynnae January. Hilda Blades Tej Murdaugh RN 12/13/14 2:50PM

## 2014-12-14 ENCOUNTER — Telehealth: Payer: Self-pay | Admitting: Internal Medicine

## 2014-12-14 ENCOUNTER — Ambulatory Visit: Payer: Medicare Other | Admitting: Internal Medicine

## 2014-12-14 NOTE — Telephone Encounter (Signed)
Call to patient to confirm appointment for 12/15/14 at 9:45 no answer

## 2014-12-15 ENCOUNTER — Ambulatory Visit (INDEPENDENT_AMBULATORY_CARE_PROVIDER_SITE_OTHER): Payer: Medicare Other | Admitting: Internal Medicine

## 2014-12-15 ENCOUNTER — Encounter: Payer: Self-pay | Admitting: Internal Medicine

## 2014-12-15 VITALS — BP 127/50 | HR 67 | Temp 98.2°F | Wt 216.3 lb

## 2014-12-15 DIAGNOSIS — E1122 Type 2 diabetes mellitus with diabetic chronic kidney disease: Secondary | ICD-10-CM | POA: Diagnosis not present

## 2014-12-15 DIAGNOSIS — Z794 Long term (current) use of insulin: Secondary | ICD-10-CM

## 2014-12-15 DIAGNOSIS — I129 Hypertensive chronic kidney disease with stage 1 through stage 4 chronic kidney disease, or unspecified chronic kidney disease: Secondary | ICD-10-CM

## 2014-12-15 DIAGNOSIS — N183 Chronic kidney disease, stage 3 unspecified: Secondary | ICD-10-CM

## 2014-12-15 DIAGNOSIS — R600 Localized edema: Secondary | ICD-10-CM

## 2014-12-15 DIAGNOSIS — R609 Edema, unspecified: Secondary | ICD-10-CM | POA: Diagnosis not present

## 2014-12-15 DIAGNOSIS — I10 Essential (primary) hypertension: Secondary | ICD-10-CM

## 2014-12-15 DIAGNOSIS — R0602 Shortness of breath: Secondary | ICD-10-CM

## 2014-12-15 LAB — BRAIN NATRIURETIC PEPTIDE: B Natriuretic Peptide: 338.6 pg/mL — ABNORMAL HIGH (ref 0.0–100.0)

## 2014-12-15 LAB — COMPREHENSIVE METABOLIC PANEL
ALT: 25 U/L (ref 17–63)
AST: 17 U/L (ref 15–41)
Albumin: 3.8 g/dL (ref 3.5–5.0)
Alkaline Phosphatase: 40 U/L (ref 38–126)
Anion gap: 7 (ref 5–15)
BUN: 23 mg/dL — ABNORMAL HIGH (ref 6–20)
CO2: 25 mmol/L (ref 22–32)
Calcium: 9.4 mg/dL (ref 8.9–10.3)
Chloride: 109 mmol/L (ref 101–111)
Creatinine, Ser: 1.83 mg/dL — ABNORMAL HIGH (ref 0.61–1.24)
GFR calc Af Amer: 41 mL/min — ABNORMAL LOW (ref >60)
GFR calc non Af Amer: 36 mL/min — ABNORMAL LOW (ref >60)
Glucose, Bld: 156 mg/dL — ABNORMAL HIGH (ref 65–99)
Potassium: 4 mmol/L (ref 3.5–5.1)
Sodium: 141 mmol/L (ref 135–145)
Total Bilirubin: 0.6 mg/dL (ref 0.3–1.2)
Total Protein: 7 g/dL (ref 6.5–8.1)

## 2014-12-15 NOTE — Patient Instructions (Signed)
1. I will mail you your labs 2. I will call you about your water pill 3. Call us about cost of meds

## 2014-12-15 NOTE — Assessment & Plan Note (Addendum)
Great control today but has double up on demadex to 20 BID. Since Cr up, will need to resume 10 BID.  BP Readings from Last 3 Encounters:  12/15/14 127/50  12/08/14 171/60  10/19/14 164/70

## 2014-12-15 NOTE — Assessment & Plan Note (Signed)
We had changed from lantus pen to levermir pen 2/2 insurance but levemir still $80 month and not even in doughnut hole. He is on levemir 20 and glucotrol 5. Cost of levemir concerning. Explained that if he could exercise and change diet and loose weight, he might be able to get off insulin onto pills. Cr now up a bit from baseline 2/2 diuresis but if rebounds, might be able to start metformin. Glucotrol could be increased. Actos might be possible - no known HF but does have ? Pul HTN. Januvia might be possible. If cr rebounds to > 50, will explore these options to get off Levemir and decrease med cost.

## 2014-12-15 NOTE — Assessment & Plan Note (Signed)
Baseline about 1.5, now up to 1.8 with increased diuretic. To resume the lower dose and will recheck at July appt.

## 2014-12-15 NOTE — Assessment & Plan Note (Addendum)
He self increased demadex to 20 BID and edema now gone - just trace on RLE and some puffiness around medial malleolus. Dopplers were negative for DVt. microalb only 20. I will complete W/U with TSH, BNP (I do not believe he has truly had an 11 lb weight increase), and CMP (alb and LFT's). 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   To resume demadex 10 BID.    BNP 389 - to be expected with renal insuff

## 2014-12-15 NOTE — Progress Notes (Signed)
   Subjective:    Patient ID: Dylan [name], male    DOB: 09/07/1945, 69 y.o.   MRN: [id number]  HPI  Dylan [name] is here for R leg edema. Please see the A&P for the status of the pt's chronic medical problems.   Review of Systems  Constitutional: Negative for unexpected weight change.  Cardiovascular: Positive for leg swelling.  Weight up 205 - 214 - 216 today       Objective:   Physical Exam  Constitutional: He appears well-developed and well-nourished. No distress.  HENT:  Head: Normocephalic and atraumatic.  Right Ear: External ear normal.  Left Ear: External ear normal.  Nose: Nose normal.  Eyes: Conjunctivae and EOM are normal.  Musculoskeletal:  Trace edema of RLE and around R medial malleolous  Skin: He is not diaphoretic.  Psychiatric: He has a normal mood and affect. His behavior is normal. Judgment and thought content normal.          Assessment & Plan:

## 2014-12-16 ENCOUNTER — Other Ambulatory Visit: Payer: Self-pay | Admitting: Internal Medicine

## 2014-12-16 DIAGNOSIS — I10 Essential (primary) hypertension: Secondary | ICD-10-CM

## 2014-12-16 LAB — TSH: TSH: 0.711 u[IU]/mL (ref 0.350–4.500)

## 2014-12-16 MED ORDER — TORSEMIDE 10 MG PO TABS: 10.0000 mg | ORAL_TABLET | Freq: Every day | ORAL | Status: DC

## 2014-12-28 ENCOUNTER — Encounter: Payer: Self-pay | Admitting: Internal Medicine

## 2014-12-28 ENCOUNTER — Ambulatory Visit (INDEPENDENT_AMBULATORY_CARE_PROVIDER_SITE_OTHER): Payer: Medicare Other | Admitting: Internal Medicine

## 2014-12-28 VITALS — BP 138/54 | HR 61 | Temp 98.0°F | Ht 66.0 in | Wt 218.0 lb

## 2014-12-28 DIAGNOSIS — I70219 Atherosclerosis of native arteries of extremities with intermittent claudication, unspecified extremity: Secondary | ICD-10-CM

## 2014-12-28 DIAGNOSIS — I739 Peripheral vascular disease, unspecified: Secondary | ICD-10-CM | POA: Diagnosis not present

## 2014-12-28 DIAGNOSIS — R600 Localized edema: Secondary | ICD-10-CM | POA: Diagnosis not present

## 2014-12-28 HISTORY — DX: Atherosclerosis of native arteries of extremities with intermittent claudication, unspecified extremity: I70.219

## 2014-12-28 NOTE — Assessment & Plan Note (Signed)
Patient does state he has some right calf pain w/ ambulation which resolves w/ rest. He claims this has interfered w/ his level of activity and has likely contributed to some of his weight gain.  -Send for ABI's to rule out PAD.

## 2014-12-28 NOTE — Progress Notes (Signed)
INTERNAL MEDICINE TEACHING ATTENDING ADDENDUM - Aldine Contes, MD: I reviewed and discussed at the time of visit with the resident Dr. Ronnald Ramp, the patient's medical history, physical examination, diagnosis and results of pertinent tests and treatment and I agree with the patient's care as documented.

## 2014-12-28 NOTE — Progress Notes (Signed)
Subjective:   Patient ID: Dylan [name] male   DOB: 1946-01-09 69 y.o.   MRN: [id number]  HPI: [name] is a 69 y.o. male w/ PMHx of HTN, DM type II, OSA w/ pulmonary HTN, chronic dCHF, and anemia, presents to the clinic today for an acute visit for LE swelling. Patient saw PCP, Dr. Lynnae [name], on 12/15/14 for similar complaints, says that his right ankle still seems to be "puffy". He states that it is not necessarily worse than before, however, he is slightly worried that it has not gone away and his weight is slightly up from before as well. He denies any significant pain, however, w/ further questioning, states he gets right calf pain w/ ambulation which resolves w/ rest. Patient also admits to some mild DOE, recent ECHO shows grade 2 diastolic dysfunction. LLE w/ only mild swelling, according to the patient. No recent chest pain, PND, or orthopnea.   Current Outpatient Prescriptions  Medication Sig Dispense Refill  . albuterol (PROVENTIL HFA;VENTOLIN HFA) 108 (90 BASE) MCG/ACT inhaler Inhale 1-2 puffs into the lungs every 6 (six) hours as needed for wheezing or shortness of breath. 3 Inhaler 0  . alum & mag hydroxide-simeth (MAALOX/MYLANTA) 200-200-20 MG/5ML suspension Take 30 mLs by mouth every 6 (six) hours as needed for indigestion or heartburn (dyspepsia). (Patient not taking: Reported on 10/19/2014) 355 mL 0  . aspirin 81 MG chewable tablet Chew 1 tablet (81 mg total) by mouth daily. (Patient not taking: Reported on 12/08/2014)    . cloNIDine (CATAPRES) 0.1 MG tablet Take 1 tablet (0.1 mg total) by mouth 3 (three) times daily. 90 tablet 3  . docusate sodium (COLACE) 100 MG capsule Take 1 capsule (100 mg total) by mouth 2 (two) times daily. 10 capsule 0  . eplerenone (INSPRA) 50 MG tablet Take 1 tablet (50 mg total) by mouth daily. 90 tablet 3  . glipiZIDE (GLUCOTROL) 5 MG tablet Take 1 tablet (5 mg total) by mouth daily. 90 tablet 3  . hydrALAZINE (APRESOLINE) 50 MG tablet Take 1 tablet (50 mg  total) by mouth every 8 (eight) hours. 270 tablet 3  . Insulin Detemir (LEVEMIR) 100 UNIT/ML Pen Inject 20 Units into the skin daily at 10 pm. 30 mL 3  . labetalol (NORMODYNE) 200 MG tablet Take 2 tablets (400 mg total) by mouth 2 (two) times daily. 360 tablet 3  . minoxidil (LONITEN) 10 MG tablet Take 2 tablets (20 mg total) by mouth daily. 180 tablet 3  . Tetrahydrozoline HCl (EYE DROPS OP) Apply 1 drop to eye 2 (two) times daily.    Marland Kitchen torsemide (DEMADEX) 10 MG tablet Take 1 tablet (10 mg total) by mouth daily. 90 tablet 3  . verapamil (VERELAN PM) 360 MG 24 hr capsule Take 1 capsule (360 mg total) by mouth daily. 90 capsule 3   No current facility-administered medications for this visit.   Review of Systems  General: Denies fever, diaphoresis, appetite change, and fatigue.  Respiratory: Positive for mild DOE. Denies cough, and wheezing.   Cardiovascular: Denies chest pain and palpitations.  Gastrointestinal: Denies nausea, vomiting, abdominal pain, and diarrhea Musculoskeletal: Positive for right calf pain w/ ambulation and right ankle swelling. Denies arthralgias, back pain, and gait problem.  Neurological: Denies dizziness, syncope, weakness, lightheadedness, and headaches.  Psychiatric/Behavioral: Denies mood changes, sleep disturbance, and agitation.   Objective:   Physical Exam: Filed Vitals:   12/28/14 0941  BP: 138/54  Pulse: 61  Temp: 98 F (36.7 C)  TempSrc:  Oral  Height: _0  (1.676 m)  Weight: 218 lb (98.884 kg)  SpO2: 96%    General: AA male, alert, cooperative, NAD. HEENT: PERRL, EOMI. Moist mucus membranes Neck: Full range of motion without pain, supple, no lymphadenopathy or carotid bruits Lungs: Clear to ascultation bilaterally, normal work of respiration, no wheezes, rales, rhonchi Heart: RRR, no murmurs, gallops, or rubs Abdomen: Soft, non-tender, non-distended, BS + Extremities: No cyanosis or clubbing. Trace/+1 pitting edema in RLE/medial malleolus.  Trace edema in LLE.  Neurologic: Alert & oriented X3, cranial nerves II-XII intact, strength grossly intact, sensation intact to light touch   Assessment & Plan:   Please see problem based assessment and plan.

## 2014-12-28 NOTE — Assessment & Plan Note (Signed)
Patient w/ continued RLE ankle swelling. Seems to be pretty asymmetrical. Patient states it is the same as his last visit at which time it was thought this was related to chronic venous insufficiency. Previous workup for DVT negative per chart review. No tenderness in the calf on exam. Does admit to some claudication type pain, however, even if patient had arterial disease in the leg, would likely not lead to edema. Patient w/ known diastolic dysfunction, however, lungs sound clear. May be related to right ventricular dysfunction in the setting of OSA, however, pulmonary pressure and right ventricular size shown to be normal on ECHO in 10/2014. Recent BNP reasonable given CKD. Agree taht this is likely related to venous stasis/insufficiency. -Patient fitted for compression stockings -Will return in 2-4 weeks to see PCP -Continue Torsemide at 10 mg daily.

## 2014-12-28 NOTE — Patient Instructions (Signed)
1. Please schedule follow up appointment for 4-6 weeks.   2. Please take all medications as previously prescribed.  Someone will call you regarding  vascular study of your legs.   Wear compression stockings for improved blood flow in the legs.   3. If you have worsening of your symptoms or new symptoms arise, please call the clinic ([phone]), or go to the ER immediately if symptoms are severe.

## 2014-12-29 NOTE — Addendum Note (Signed)
Addended byCorky Sox on: 12/29/2014 02:52 PM   Modules accepted: Level of Service

## 2015-01-01 ENCOUNTER — Ambulatory Visit (HOSPITAL_BASED_OUTPATIENT_CLINIC_OR_DEPARTMENT_OTHER): Payer: Medicare Other | Attending: Internal Medicine

## 2015-01-01 VITALS — Ht 66.0 in | Wt 220.0 lb

## 2015-01-01 DIAGNOSIS — G4733 Obstructive sleep apnea (adult) (pediatric): Secondary | ICD-10-CM | POA: Diagnosis not present

## 2015-01-01 DIAGNOSIS — R0683 Snoring: Secondary | ICD-10-CM | POA: Insufficient documentation

## 2015-01-01 DIAGNOSIS — G4710 Hypersomnia, unspecified: Secondary | ICD-10-CM | POA: Diagnosis present

## 2015-01-01 DIAGNOSIS — G4730 Sleep apnea, unspecified: Secondary | ICD-10-CM

## 2015-01-05 ENCOUNTER — Emergency Department (HOSPITAL_COMMUNITY): Payer: Medicare Other

## 2015-01-05 ENCOUNTER — Emergency Department (HOSPITAL_COMMUNITY)
Admission: EM | Admit: 2015-01-05 | Discharge: 2015-01-05 | Disposition: A | Discharge status: Home / Self Care | Payer: Medicare Other | Attending: Emergency Medicine | Admitting: Emergency Medicine

## 2015-01-05 ENCOUNTER — Encounter (HOSPITAL_COMMUNITY): Payer: Self-pay | Admitting: *Deleted

## 2015-01-05 DIAGNOSIS — Z79899 Other long term (current) drug therapy: Secondary | ICD-10-CM | POA: Insufficient documentation

## 2015-01-05 DIAGNOSIS — Z862 Personal history of diseases of the blood and blood-forming organs and certain disorders involving the immune mechanism: Secondary | ICD-10-CM | POA: Diagnosis not present

## 2015-01-05 DIAGNOSIS — Z7982 Long term (current) use of aspirin: Secondary | ICD-10-CM | POA: Diagnosis not present

## 2015-01-05 DIAGNOSIS — Z794 Long term (current) use of insulin: Secondary | ICD-10-CM | POA: Insufficient documentation

## 2015-01-05 DIAGNOSIS — G4733 Obstructive sleep apnea (adult) (pediatric): Secondary | ICD-10-CM | POA: Diagnosis not present

## 2015-01-05 DIAGNOSIS — M7989 Other specified soft tissue disorders: Secondary | ICD-10-CM | POA: Diagnosis not present

## 2015-01-05 DIAGNOSIS — I509 Heart failure, unspecified: Secondary | ICD-10-CM | POA: Diagnosis not present

## 2015-01-05 DIAGNOSIS — E11649 Type 2 diabetes mellitus with hypoglycemia without coma: Secondary | ICD-10-CM | POA: Insufficient documentation

## 2015-01-05 DIAGNOSIS — Z87891 Personal history of nicotine dependence: Secondary | ICD-10-CM | POA: Diagnosis not present

## 2015-01-05 DIAGNOSIS — I5032 Chronic diastolic (congestive) heart failure: Secondary | ICD-10-CM | POA: Insufficient documentation

## 2015-01-05 DIAGNOSIS — R0602 Shortness of breath: Secondary | ICD-10-CM | POA: Diagnosis not present

## 2015-01-05 DIAGNOSIS — I10 Essential (primary) hypertension: Secondary | ICD-10-CM

## 2015-01-05 DIAGNOSIS — E1129 Type 2 diabetes mellitus with other diabetic kidney complication: Secondary | ICD-10-CM | POA: Diagnosis not present

## 2015-01-05 DIAGNOSIS — I129 Hypertensive chronic kidney disease with stage 1 through stage 4 chronic kidney disease, or unspecified chronic kidney disease: Secondary | ICD-10-CM | POA: Diagnosis not present

## 2015-01-05 DIAGNOSIS — Z8719 Personal history of other diseases of the digestive system: Secondary | ICD-10-CM | POA: Diagnosis not present

## 2015-01-05 DIAGNOSIS — N183 Chronic kidney disease, stage 3 (moderate): Secondary | ICD-10-CM | POA: Insufficient documentation

## 2015-01-05 DIAGNOSIS — E162 Hypoglycemia, unspecified: Secondary | ICD-10-CM

## 2015-01-05 LAB — I-STAT TROPONIN, ED: Troponin i, poc: 0 ng/mL (ref 0.00–0.08)

## 2015-01-05 LAB — COMPREHENSIVE METABOLIC PANEL
ALT: 22 U/L (ref 17–63)
AST: 18 U/L (ref 15–41)
Albumin: 4 g/dL (ref 3.5–5.0)
Alkaline Phosphatase: 43 U/L (ref 38–126)
Anion gap: 9 (ref 5–15)
BUN: 16 mg/dL (ref 6–20)
CO2: 23 mmol/L (ref 22–32)
Calcium: 9.1 mg/dL (ref 8.9–10.3)
Chloride: 111 mmol/L (ref 101–111)
Creatinine, Ser: 1.62 mg/dL — ABNORMAL HIGH (ref 0.61–1.24)
GFR calc Af Amer: 48 mL/min — ABNORMAL LOW (ref >60)
GFR calc non Af Amer: 41 mL/min — ABNORMAL LOW (ref >60)
Glucose, Bld: 62 mg/dL — ABNORMAL LOW (ref 65–99)
Potassium: 3.7 mmol/L (ref 3.5–5.1)
Sodium: 143 mmol/L (ref 135–145)
Total Bilirubin: 0.7 mg/dL (ref 0.3–1.2)
Total Protein: 7.6 g/dL (ref 6.5–8.1)

## 2015-01-05 LAB — CBC
HCT: 34.4 % — ABNORMAL LOW (ref 39.0–52.0)
Hemoglobin: 11.1 g/dL — ABNORMAL LOW (ref 13.0–17.0)
MCH: 30.1 pg (ref 26.0–34.0)
MCHC: 32.3 g/dL (ref 30.0–36.0)
MCV: 93.2 fL (ref 78.0–100.0)
Platelets: 163 10*3/uL (ref 150–400)
RBC: 3.69 MIL/uL — ABNORMAL LOW (ref 4.22–5.81)
RDW: 13.2 % (ref 11.5–15.5)
WBC: 9.5 10*3/uL (ref 4.0–10.5)

## 2015-01-05 LAB — BRAIN NATRIURETIC PEPTIDE: B Natriuretic Peptide: 450.7 pg/mL — ABNORMAL HIGH (ref 0.0–100.0)

## 2015-01-05 LAB — CBG MONITORING, ED: Glucose-Capillary: 79 mg/dL (ref 65–99)

## 2015-01-05 MED ORDER — TORSEMIDE 10 MG PO TABS: 10.0000 mg | ORAL_TABLET | Freq: Two times a day (BID) | ORAL | Status: DC

## 2015-01-05 MED ORDER — TORSEMIDE 10 MG PO TABS
10.0000 mg | ORAL_TABLET | Freq: Once | ORAL | Status: AC
  Administered 2015-01-05: 10 mg via ORAL
  Filled 2015-01-05: qty 1

## 2015-01-05 NOTE — Discharge Instructions (Signed)
Heart Failure °Heart failure is a condition in which the heart has trouble pumping blood. This means your heart does not pump blood efficiently for your body to work well. In some cases of heart failure, fluid may back up into your lungs or you may have swelling (edema) in your lower legs. Heart failure is usually a long-term (chronic) condition. It is important for you to take good care of yourself and follow your health care provider's treatment plan. °CAUSES  °Some health conditions can cause heart failure. Those health conditions include: °· High blood pressure (hypertension). Hypertension causes the heart muscle to work harder than normal. When pressure in the blood vessels is high, the heart needs to pump (contract) with more force in order to circulate blood throughout the body. High blood pressure eventually causes the heart to become stiff and weak. °· Coronary artery disease (CAD). CAD is the buildup of cholesterol and fat (plaque) in the arteries of the heart. The blockage in the arteries deprives the heart muscle of oxygen and blood. This can cause chest pain and may lead to a heart attack. High blood pressure can also contribute to CAD. °· Heart attack (myocardial infarction). A heart attack occurs when one or more arteries in the heart become blocked. The loss of oxygen damages the muscle tissue of the heart. When this happens, part of the heart muscle dies. The injured tissue does not contract as well and weakens the heart's ability to pump blood. °· Abnormal heart valves. When the heart valves do not open and close properly, it can cause heart failure. This makes the heart muscle pump harder to keep the blood flowing. °· Heart muscle disease (cardiomyopathy or myocarditis). Heart muscle disease is damage to the heart muscle from a variety of causes. These can include drug or alcohol abuse, infections, or unknown reasons. These can increase the risk of heart failure. °· Lung disease. Lung disease  makes the heart work harder because the lungs do not work properly. This can cause a strain on the heart, leading it to fail. °· Diabetes. Diabetes increases the risk of heart failure. High blood sugar contributes to high fat (lipid) levels in the blood. Diabetes can also cause slow damage to tiny blood vessels that carry important nutrients to the heart muscle. When the heart does not get enough oxygen and food, it can cause the heart to become weak and stiff. This leads to a heart that does not contract efficiently. °· Other conditions can contribute to heart failure. These include abnormal heart rhythms, thyroid problems, and low blood counts (anemia). °Certain unhealthy behaviors can increase the risk of heart failure, including: °· Being overweight. °· Smoking or chewing tobacco. °· Eating foods high in fat and cholesterol. °· Abusing illicit drugs or alcohol. °· Lacking physical activity. °SYMPTOMS  °Heart failure symptoms may vary and can be hard to detect. Symptoms may include: °· Shortness of breath with activity, such as climbing stairs. °· Persistent cough. °· Swelling of the feet, ankles, legs, or abdomen. °· Unexplained weight gain. °· Difficulty breathing when lying flat (orthopnea). °· Waking from sleep because of the need to sit up and get more air. °· Rapid heartbeat. °· Fatigue and loss of energy. °· Feeling light-headed, dizzy, or close to fainting. °· Loss of appetite. °· Nausea. °· Increased urination during the night (nocturia). °DIAGNOSIS  °A diagnosis of heart failure is based on your history, symptoms, physical examination, and diagnostic tests. Diagnostic tests for heart failure may include: °·   Echocardiography.  Electrocardiography.  Chest X-ray.  Blood tests.  Exercise stress test.  Cardiac angiography.  Radionuclide scans. TREATMENT  Treatment is aimed at managing the symptoms of heart failure. Medicines, behavioral changes, or surgical intervention may be necessary to  treat heart failure.  Medicines to help treat heart failure may include:  Angiotensin-converting enzyme (ACE) inhibitors. This type of medicine blocks the effects of a blood protein called angiotensin-converting enzyme. ACE inhibitors relax (dilate) the blood vessels and help lower blood pressure.  Angiotensin receptor blockers (ARBs). This type of medicine blocks the actions of a blood protein called angiotensin. Angiotensin receptor blockers dilate the blood vessels and help lower blood pressure.  Water pills (diuretics). Diuretics cause the kidneys to remove salt and water from the blood. The extra fluid is removed through urination. This loss of extra fluid lowers the volume of blood the heart pumps.  Beta blockers. These prevent the heart from beating too fast and improve heart muscle strength.  Digitalis. This increases the force of the heartbeat.  Healthy behavior changes include:  Obtaining and maintaining a healthy weight.  Stopping smoking or chewing tobacco.  Eating heart-healthy foods.  Limiting or avoiding alcohol.  Stopping illicit drug use.  Physical activity as directed by your health care provider.  Surgical treatment for heart failure may include:  A procedure to open blocked arteries, repair damaged heart valves, or remove damaged heart muscle tissue.  A pacemaker to improve heart muscle function and control certain abnormal heart rhythms.  An internal cardioverter defibrillator to treat certain serious abnormal heart rhythms.  A left ventricular assist device (LVAD) to assist the pumping ability of the heart. HOME CARE INSTRUCTIONS   Take medicines only as directed by your health care provider. Medicines are important in reducing the workload of your heart, slowing the progression of heart failure, and improving your symptoms.  Do not stop taking your medicine unless directed by your health care provider.  Do not skip any dose of medicine.  Refill your  prescriptions before you run out of medicine. Your medicines are needed every day.  Engage in moderate physical activity if directed by your health care provider. Moderate physical activity can benefit some people. The elderly and people with severe heart failure should consult with a health care provider for physical activity recommendations.  Eat heart-healthy foods. Food choices should be free of trans fat and low in saturated fat, cholesterol, and salt (sodium). Healthy choices include fresh or frozen fruits and vegetables, fish, lean meats, legumes, fat-free or low-fat dairy products, and whole grain or high fiber foods. Talk to a dietitian to learn more about heart-healthy foods.  Limit sodium if directed by your health care provider. Sodium restriction may reduce symptoms of heart failure in some people. Talk to a dietitian to learn more about heart-healthy seasonings.  Use healthy cooking methods. Healthy cooking methods include roasting, grilling, broiling, baking, poaching, steaming, or stir-frying. Talk to a dietitian to learn more about healthy cooking methods.  Limit fluids if directed by your health care provider. Fluid restriction may reduce symptoms of heart failure in some people.  Weigh yourself every day. Daily weights are important in the early recognition of excess fluid. You should weigh yourself every morning after you urinate and before you eat breakfast. Wear the same amount of clothing each time you weigh yourself. Record your daily weight. Provide your health care provider with your weight record.  Monitor and record your blood pressure if directed by your health care  provider.  Check your pulse if directed by your health care provider.  Lose weight if directed by your health care provider. Weight loss may reduce symptoms of heart failure in some people.  Stop smoking or chewing tobacco. Nicotine makes your heart work harder by causing your blood vessels to constrict.  Do not use nicotine gum or patches before talking to your health care provider.  Keep all follow-up visits as directed by your health care provider. This is important.  Limit alcohol intake to no more than 1 drink per day for nonpregnant women and 2 drinks per day for men. One drink equals 12 ounces of beer, 5 ounces of wine, or 1 ounces of hard liquor. Drinking more than that is harmful to your heart. Tell your health care provider if you drink alcohol several times a week. Talk with your health care provider about whether alcohol is safe for you. If your heart has already been damaged by alcohol or you have severe heart failure, drinking alcohol should be stopped completely.  Stop illicit drug use.  Stay up-to-date with immunizations. It is especially important to prevent respiratory infections through current pneumococcal and influenza immunizations.  Manage other health conditions such as hypertension, diabetes, thyroid disease, or abnormal heart rhythms as directed by your health care provider.  Learn to manage stress.  Plan rest periods when fatigued.  Learn strategies to manage high temperatures. If the weather is extremely hot:  Avoid vigorous physical activity.  Use air conditioning or fans or seek a cooler location.  Avoid caffeine and alcohol.  Wear loose-fitting, lightweight, and light-colored clothing.  Learn strategies to manage cold temperatures. If the weather is extremely cold:  Avoid vigorous physical activity.  Layer clothes.  Wear mittens or gloves, a hat, and a scarf when going outside.  Avoid alcohol.  Obtain ongoing education and support as needed.  Participate in or seek rehabilitation as needed to maintain or improve independence and quality of life. SEEK MEDICAL CARE IF:   Your weight increases by 03 lb/1.4 kg in 1 day or 05 lb/2.3 kg in a week.  You have increasing shortness of breath that is unusual for you.  You are unable to participate in  your usual physical activities.  You tire easily.  You cough more than normal, especially with physical activity.  You have any or more swelling in areas such as your hands, feet, ankles, or abdomen.  You are unable to sleep because it is hard to breathe.  You feel like your heart is beating fast (palpitations).  You become dizzy or light-headed upon standing up. SEEK IMMEDIATE MEDICAL CARE IF:   You have difficulty breathing.  There is a change in mental status such as decreased alertness or difficulty with concentration.  You have a pain or discomfort in your chest.  You have an episode of fainting (syncope). MAKE SURE YOU:   Understand these instructions.  Will watch your condition.  Will get help right away if you are not doing well or get worse. Document Released: 06/24/2005 Document Revised: 11/08/2013 Document Reviewed: 07/24/2012 [facility] Patient Information 2015 [place], [place]. This information is not intended to replace advice given to you by your health care provider. Make sure you discuss any questions you have with your health care provider.  Hypoglycemia Hypoglycemia occurs when the glucose in your blood is too low. Glucose is a type of sugar that is your body's main energy source. Hormones, such as insulin and glucagon, control the level of glucose  in the blood. Insulin lowers blood glucose and glucagon increases blood glucose. Having too much insulin in your blood stream, or not eating enough food containing sugar, can result in hypoglycemia. Hypoglycemia can happen to people with or without diabetes. It can develop quickly and can be a medical emergency.  CAUSES   Missing or delaying meals.  Not eating enough carbohydrates at meals.  Taking too much diabetes medicine.  Not timing your oral diabetes medicine or insulin doses with meals, snacks, and exercise.  Nausea and vomiting.  Certain medicines.  Severe illnesses, such as hepatitis, kidney  disorders, and certain eating disorders.  Increased activity or exercise without eating something extra or adjusting medicines.  Drinking too much alcohol.  A nerve disorder that affects body functions like your heart rate, blood pressure, and digestion (autonomic neuropathy).  A condition where the stomach muscles do not function properly (gastroparesis). Therefore, medicines and food may not absorb properly.  Rarely, a tumor of the pancreas can produce too much insulin. SYMPTOMS   Hunger.  Sweating (diaphoresis).  Change in body temperature.  Shakiness.  Headache.  Anxiety.  Lightheadedness.  Irritability.  Difficulty concentrating.  Dry mouth.  Tingling or numbness in the hands or feet.  Restless sleep or sleep disturbances.  Altered speech and coordination.  Change in mental status.  Seizures or prolonged convulsions.  Combativeness.  Drowsiness (lethargic).  Weakness.  Increased heart rate or palpitations.  Confusion.  Pale, gray skin color.  Blurred or double vision.  Fainting. DIAGNOSIS  A physical exam and medical history will be performed. Your caregiver may make a diagnosis based on your symptoms. Blood tests and other lab tests may be performed to confirm a diagnosis. Once the diagnosis is made, your caregiver will see if your signs and symptoms go away once your blood glucose is raised.  TREATMENT  Usually, you can easily treat your hypoglycemia when you notice symptoms.  Check your blood glucose. If it is less than 70 mg/dl, take one of the following:   3-4 glucose tablets.    cup juice.    cup regular soda.   1 cup skim milk.   -1 tube of glucose gel.   5-6 hard candies.   Avoid high-fat drinks or food that may delay a rise in blood glucose levels.  Do not take more than the recommended amount of sugary foods, drinks, gel, or tablets. Doing so will cause your blood glucose to go too high.   Wait 10-15 minutes  and recheck your blood glucose. If it is still less than 70 mg/dl or below your target range, repeat treatment.   Eat a snack if it is more than 1 hour until your next meal.  There may be a time when your blood glucose may go so low that you are unable to treat yourself at home when you start to notice symptoms. You may need someone to help you. You may even faint or be unable to swallow. If you cannot treat yourself, someone will need to bring you to the hospital.  [place]  If you have diabetes, follow your diabetes management plan by:  Taking your medicines as directed.  Following your exercise plan.  Following your meal plan. Do not skip meals. Eat on time.  Testing your blood glucose regularly. Check your blood glucose before and after exercise. If you exercise longer or different than usual, be sure to check blood glucose more frequently.  Wearing your medical alert jewelry that says you  have diabetes.  Identify the cause of your hypoglycemia. Then, develop ways to prevent the recurrence of hypoglycemia.  Do not take a hot bath or shower right after an insulin shot.  Always carry treatment with you. Glucose tablets are the easiest to carry.  If you are going to drink alcohol, drink it only with meals.  Tell friends or family members ways to keep you safe during a seizure. This may include removing hard or sharp objects from the area or turning you on your side.  Maintain a healthy weight. SEEK MEDICAL CARE IF:   You are having problems keeping your blood glucose in your target range.  You are having frequent episodes of hypoglycemia.  You feel you might be having side effects from your medicines.  You are not sure why your blood glucose is dropping so low.  You notice a change in vision or a new problem with your vision. SEEK IMMEDIATE MEDICAL CARE IF:   Confusion develops.  A change in mental status occurs.  The inability to swallow  develops.  Fainting occurs. Document Released: 06/24/2005 Document Revised: 06/29/2013 Document Reviewed: 10/21/2011 [facility] Patient Information 2015 [place], [place]. This information is not intended to replace advice given to you by your health care provider. Make sure you discuss any questions you have with your health care provider.

## 2015-01-05 NOTE — ED Notes (Signed)
Patient transported to X-ray 

## 2015-01-05 NOTE — ED Notes (Signed)
Checked patient blood sugar it was 52 notified RN [place] of blood sugar

## 2015-01-05 NOTE — ED Notes (Signed)
Pt c/o sob and LE edema for several weeks.  States last night dyspnea on exertion and laying flat increased.  Denies chest pain.  Hx of heart failure.

## 2015-01-05 NOTE — ED Provider Notes (Signed)
CSN: [id number]     Arrival date & time 01/05/15  1404 History   First MD Initiated Contact with Patient 01/05/15 1416     Chief Complaint  Patient presents with  . Shortness of Breath     (Consider location/radiation/quality/duration/timing/severity/associated sxs/prior Treatment) Patient is a 69 y.o. male presenting with shortness of breath. The history is provided by the patient.  Shortness of Breath Severity:  Moderate Associated symptoms: no abdominal pain, no chest pain, no headaches, no rash and no vomiting    patient with shortness of breath. Began a few days ago. Worse with lying down. Worse swelling. Has had a mild nonproductive cough. No fevers. No sick contacts. States he feels as if he is carrying more fluids. No abdominal pain. No change his medications. Recently seen by his PCP for swelling in his legs. States both legs are swollen but looks at most of the evaluation was done over his right leg.  Past Medical History  Diagnosis Date  . Essential hypertension 10/09/2014    Poor control with 5 drug therapy.   . DM (diabetes mellitus), type 2 with renal complications 09/06/9516  . Pulmonary HTN 10/18/2014    Noted as severe ECHO 2016. Felt to be 2/2 untreated OSA. Further W/U needed.   . OSA on CPAP 10/08/2014  . Chronic renal failure, stage 3 (moderate) 10/18/2014    Baseline Cr about 1.5. Stable from 2010 to 2016.   Marland Kitchen Chronic diastolic heart failure 8/41/6606    Noted ECHO 10/2014. Grade 2. EF 50-55%   . Anemia 10/18/2014    Baseline about 12 and stable from 2010 to 2016. Colon 2009 in [place] (records cannot be obtained).  EGD Dr Benson [place] 2011 nl   . Former tobacco use 10/08/2014  . Diverticulosis 10/08/2014    Seen on CT. Reportedly on Colon in [place] in 2009. Freq bouts of diverticulitis.   Past Surgical History  Procedure Laterality Date  . Cholecystectomy     No family history on file. History  Substance Use Topics  . Smoking status: Former Smoker    Quit date: 10/02/1972  .  Smokeless tobacco: Never Used  . Alcohol Use: No    Review of Systems  Constitutional: Negative for activity change and appetite change.  Eyes: Negative for pain.  Respiratory: Positive for shortness of breath. Negative for chest tightness.   Cardiovascular: Positive for leg swelling. Negative for chest pain.  Gastrointestinal: Negative for nausea, vomiting, abdominal pain and diarrhea.  Genitourinary: Negative for flank pain.  Musculoskeletal: Negative for back pain and neck stiffness.  Skin: Negative for rash.  Neurological: Negative for weakness, numbness and headaches.  Psychiatric/Behavioral: Negative for behavioral problems.      Allergies  Spironolactone  Home Medications   Prior to Admission medications   Medication Sig Start Date End Date Taking? Authorizing Provider  albuterol (PROVENTIL HFA;VENTOLIN HFA) 108 (90 BASE) MCG/ACT inhaler Inhale 1-2 puffs into the lungs every 6 (six) hours as needed for wheezing or shortness of breath. 12/08/14  Yes Bartholomew Crews, MD  cloNIDine (CATAPRES) 0.1 MG tablet Take 1 tablet (0.1 mg total) by mouth 3 (three) times daily. 12/08/14  Yes Bartholomew Crews, MD  eplerenone (INSPRA) 50 MG tablet Take 1 tablet (50 mg total) by mouth daily. 12/08/14  Yes Bartholomew Crews, MD  glipiZIDE (GLUCOTROL) 5 MG tablet Take 1 tablet (5 mg total) by mouth daily. 12/08/14  Yes Bartholomew Crews, MD  hydrALAZINE (APRESOLINE) 50 MG tablet Take 1 tablet (50  mg total) by mouth every 8 (eight) hours. 12/08/14  Yes Bartholomew Crews, MD  Insulin Detemir (LEVEMIR) 100 UNIT/ML Pen Inject 20 Units into the skin daily at 10 pm. 12/08/14  Yes Bartholomew Crews, MD  labetalol (NORMODYNE) 200 MG tablet Take 2 tablets (400 mg total) by mouth 2 (two) times daily. 12/08/14  Yes Bartholomew Crews, MD  minoxidil (LONITEN) 10 MG tablet Take 2 tablets (20 mg total) by mouth daily. 12/08/14  Yes Bartholomew Crews, MD  Tetrahydrozoline HCl (EYE DROPS OP) Apply 1 drop to  eye 2 (two) times daily.   Yes Historical Provider, MD  verapamil (VERELAN PM) 360 MG 24 hr capsule Take 1 capsule (360 mg total) by mouth daily. 12/08/14  Yes Bartholomew Crews, MD  alum & mag hydroxide-simeth (MAALOX/MYLANTA) 200-200-20 MG/5ML suspension Take 30 mLs by mouth every 6 (six) hours as needed for indigestion or heartburn (dyspepsia). Patient not taking: Reported on 10/19/2014 10/13/14   Velta Addison Mikhail, DO  aspirin 81 MG chewable tablet Chew 1 tablet (81 mg total) by mouth daily. Patient not taking: Reported on 12/08/2014 10/13/14   Velta Addison Mikhail, DO  docusate sodium (COLACE) 100 MG capsule Take 1 capsule (100 mg total) by mouth 2 (two) times daily. Patient not taking: Reported on 01/05/2015 10/13/14   Velta Addison Mikhail, DO  torsemide (DEMADEX) 10 MG tablet Take 1 tablet (10 mg total) by mouth 2 (two) times daily. 01/05/15   Davonna Belling, MD   BP 164/71 mmHg  Pulse 63  Temp(Src) 98.5 F (36.9 C) (Oral)  Resp 24  Ht _0  (1.676 m)  Wt 216 lb (97.977 kg)  BMI 34.88 kg/m2  SpO2 94% Physical Exam  ED Course  Procedures (including critical care time) Labs Review Labs Reviewed  CBC - Abnormal; Notable for the following:    RBC 3.69 (*)    Hemoglobin 11.1 (*)    HCT 34.4 (*)    All other components within normal limits  BRAIN NATRIURETIC PEPTIDE - Abnormal; Notable for the following:    B Natriuretic Peptide 450.7 (*)    All other components within normal limits  COMPREHENSIVE METABOLIC PANEL - Abnormal; Notable for the following:    Glucose, Bld 62 (*)    Creatinine, Ser 1.62 (*)    GFR calc non Af Amer 41 (*)    GFR calc Af Amer 48 (*)    All other components within normal limits  I-STAT TROPOININ, ED  CBG MONITORING, ED    Imaging Review Dg Chest 2 View  01/05/2015   CLINICAL DATA:  Shortness of breath and wheezing for 1 day  EXAM: CHEST  2 VIEW  COMPARISON:  October 10, 2014  FINDINGS: There is no edema or consolidation. Heart is slightly enlarged with pulmonary  vascularity within normal limits. No adenopathy. There is mild anterior wedging of a lower thoracic vertebral body, stable.  IMPRESSION: Slight cardiomegaly.  No edema or consolidation.   Electronically Signed   By: Lowella Grip III M.D.   On: 01/05/2015 14:55     EKG Interpretation None     ED ECG REPORT   Date: 01/05/2015  Rate: 70  Rhythm: normal sinus rhythm  QRS Axis: normal  Intervals: normal  ST/T Wave abnormalities: nonspecific ST/T changes  Conduction Disutrbances:none  Narrative Interpretation:   Old EKG Reviewed: unchanged  MDM   Final diagnoses:  Congestive heart failure, unspecified congestive heart failure chronicity, unspecified congestive heart failure type  Hypoglycemia    Patient with  some shortness of breath, worse with lying down. Has edema in both his lower legs. BNP is elevated. Chest x-ray does not show frank CHF. Will increase Demadex and a follow-up with his PCP. Has previously been worked up for DVT and was negative. Also had mild hypoglycemia but states he did not eat much. Patient was fed in the ER sugar will be rechecked.    Davonna Belling, MD 01/05/15 [phone]

## 2015-01-07 DIAGNOSIS — G4733 Obstructive sleep apnea (adult) (pediatric): Secondary | ICD-10-CM | POA: Diagnosis not present

## 2015-01-07 DIAGNOSIS — G4730 Sleep apnea, unspecified: Secondary | ICD-10-CM | POA: Diagnosis not present

## 2015-01-07 NOTE — Progress Notes (Signed)
NAME: Dylan [name] DATE OF BIRTH:  Nov 16, 1945 MEDICAL RECORD NUMBER [id number]  LOCATION: [facility]  PHYSICIAN: Mishael Krysiak D  DATE OF STUDY: 01/01/2015  SLEEP STUDY TYPE: Nocturnal Polysomnogram               REFERRING PHYSICIAN: Bartholomew Crews, MD  INDICATION FOR STUDY: Hypersomnia with sleep apnea  EPWORTH SLEEPINESS SCORE:   17/24 HEIGHT: 5' 6" (167.6 cm)  WEIGHT: 220 lb (99.791 kg)    Body mass index is 35.53 kg/(m^2).  NECK SIZE: 18 in.  MEDICATIONS: Charted for review  SLEEP ARCHITECTURE: Split study protocol. During the diagnostic phase, total sleep time 142.5 minutes with sleep efficiency 89.1%. Stage I was 7.4%, stage II 80.7%, stage III absent, REM 11.9% of total sleep time. Sleep latency 2 minutes, REM latency 132.5 minutes, awake after sleep onset 15.5 minutes, arousal index 4.6, bedtime medication: Labetalol, minoxidil, clonidine, hydralazine  RESPIRATORY DATA: Apnea hypopnea index (AHI) 128.4 per hour. 305 total events scored including 251 obstructive apneas, 1 mixed apnea, 53 hypopneas. Non-positional events. REM AHI 130.6 per hour. CPAP was titrated to 13 CWP with residual AHI 126 per hour due to residual events. He was then changed to bilevel (BiPAP) and titrated to a final inspiratory pressure of 21 and expiratory pressure of 17 CWP with left only a few residual hypopneas. He wore a large fullface mask.  OXYGEN DATA: Mild snoring was recorded before CPAP with oxygen desaturation to a nadir of 84% on room air. With CPAP control, snoring was prevented and mean oxygen saturation was 90.4%.  CARDIAC DATA: Sinus rhythm with PACs, PVCs  MOVEMENT/PARASOMNIA: Occasional limb jerks were noted with little effect on sleep. Bathroom 1  IMPRESSION/ RECOMMENDATION:   1) Severe obstructive sleep apnea/hypopnea syndrome, AHI 128.4 per hour with non-positional events. Mild snoring reported by technician before CPAP with oxygen desaturation to a nadir  of 84% on room air. 2) CPAP was titrated to 13 CWP with residual obstructive events and hypopneas. He was then changed to bilevel (BiPAP) and titrated to a final inspiratory pressure of 21 and expiratory pressure of 17 CWP. Still a few residual hypopneas. This would be the suggested initial home pressure setting. Consider ENT evaluation for potentially correctable upper airway obstruction contributing to the need for unusually high pressures.He wore a large F&P Simplus fullface mask with heated humidifier   Deneise Lever Diplomate, American Board of Sleep Medicine  ELECTRONICALLY SIGNED ON:  01/07/2015, 11:34 AM [place] PH: [phone]   FX: [phone] [place]

## 2015-01-12 ENCOUNTER — Ambulatory Visit (INDEPENDENT_AMBULATORY_CARE_PROVIDER_SITE_OTHER): Payer: Medicare Other | Admitting: Internal Medicine

## 2015-01-12 ENCOUNTER — Encounter: Payer: Self-pay | Admitting: Internal Medicine

## 2015-01-12 VITALS — BP 135/61 | HR 61 | Temp 98.1°F | Ht 66.0 in | Wt 214.8 lb

## 2015-01-12 DIAGNOSIS — G4733 Obstructive sleep apnea (adult) (pediatric): Secondary | ICD-10-CM | POA: Diagnosis not present

## 2015-01-12 DIAGNOSIS — I739 Peripheral vascular disease, unspecified: Secondary | ICD-10-CM

## 2015-01-12 DIAGNOSIS — E1122 Type 2 diabetes mellitus with diabetic chronic kidney disease: Secondary | ICD-10-CM

## 2015-01-12 DIAGNOSIS — I270 Primary pulmonary hypertension: Secondary | ICD-10-CM | POA: Diagnosis not present

## 2015-01-12 DIAGNOSIS — Z23 Encounter for immunization: Secondary | ICD-10-CM | POA: Diagnosis not present

## 2015-01-12 DIAGNOSIS — Z9989 Dependence on other enabling machines and devices: Secondary | ICD-10-CM

## 2015-01-12 DIAGNOSIS — I272 Pulmonary hypertension, unspecified: Secondary | ICD-10-CM

## 2015-01-12 DIAGNOSIS — N183 Chronic kidney disease, stage 3 unspecified: Secondary | ICD-10-CM

## 2015-01-12 DIAGNOSIS — I10 Essential (primary) hypertension: Secondary | ICD-10-CM | POA: Diagnosis not present

## 2015-01-12 DIAGNOSIS — E1129 Type 2 diabetes mellitus with other diabetic kidney complication: Secondary | ICD-10-CM

## 2015-01-12 LAB — BASIC METABOLIC PANEL WITH GFR
BUN: 25 mg/dL — ABNORMAL HIGH (ref 6–23)
CO2: 27 mEq/L (ref 19–32)
Calcium: 9 mg/dL (ref 8.4–10.5)
Chloride: 103 mEq/L (ref 96–112)
Creat: 1.59 mg/dL — ABNORMAL HIGH (ref 0.50–1.35)
GFR, Est African American: 50 mL/min — ABNORMAL LOW
GFR, Est Non African American: 43 mL/min — ABNORMAL LOW
Glucose, Bld: 173 mg/dL — ABNORMAL HIGH (ref 70–99)
Potassium: 3.5 mEq/L (ref 3.5–5.3)
Sodium: 143 mEq/L (ref 135–145)

## 2015-01-12 LAB — POCT GLYCOSYLATED HEMOGLOBIN (HGB A1C): Hemoglobin A1C: 6.2

## 2015-01-12 LAB — LIPID PANEL
Cholesterol: 142 mg/dL (ref 0–200)
HDL: 31 mg/dL — ABNORMAL LOW (ref >=40)
LDL Cholesterol: 94 mg/dL (ref 0–99)
Total CHOL/HDL Ratio: 4.6 Ratio
Triglycerides: 87 mg/dL (ref <150)
VLDL: 17 mg/dL (ref 0–40)

## 2015-01-12 LAB — GLUCOSE, CAPILLARY: Glucose-Capillary: 181 mg/dL — ABNORMAL HIGH (ref 65–99)

## 2015-01-12 NOTE — Patient Instructions (Signed)
1. Please call me if you have trouble with the sleep mask 2. Pls call me if the swelling gets worse 3.I am sending you to ears, nose, and throat

## 2015-01-13 NOTE — Assessment & Plan Note (Signed)
BP Readings from Last 3 Encounters:  01/12/15 135/61  01/05/15 167/62  12/28/14 138/54   BP is great today. Clonidine 0.1 TID, Eplerenone 50 QD, hydral 50 Q8 hrs, labetolol 400 BID, verapamil 360 QD. Confusion over demedex dose - ER had increased it 2/2 presumed pul HTN - but pt adament taking 10 mg 1 in AM and 2 in PM.   We discussed in depth that losing 5-10 lbs would help BP and that tx OSA might also help BP to improve and be less difficult to control.   Pt states BP always good in AM after pills but then increases over day.I thought about staggering doses but already on staggered doses of the BID and TID meds. Leave meds as is.

## 2015-01-13 NOTE — Assessment & Plan Note (Signed)
Pul HTN likely 2/2 severe untreated OSA. Will treat OSA. No further W/U for now.

## 2015-01-13 NOTE — Assessment & Plan Note (Signed)
Used to [place] able to [place]. Now calves cramp and has to stop and the pain gets better. We discussed ABI and willing but after he sees and pays for ENT

## 2015-01-13 NOTE — Assessment & Plan Note (Addendum)
Results of sleep study in overview. Willing to try BiPap. Order printed off and given to Gilman. Stressed that he needs to call us if issues tolerating it - had issues with CPAP in past bc likes to sleep on stomach. Discussed benefits of controlling OSA. Wiling to see ENT to see if any intervention would be beneficial. Neglected to view oropharynx today but examined it in April and noted was crowded.

## 2015-01-13 NOTE — Progress Notes (Signed)
   Subjective:    Patient ID: Dylan [name], male    DOB: 09-Jan-1946, 69 y.o.   MRN: [id number]  HPI  Dhanvin Szeto is here for OSA F/U. Please see the A&P for the status of the pt's chronic medical problems.   Review of Systems  Constitutional: Negative for unexpected weight change.  Respiratory: Negative for shortness of breath.   Cardiovascular: Positive for leg swelling. Negative for chest pain.       + LE leg weakness / fatigue with walking and distance is limited.       Objective:   Physical Exam  Constitutional: He appears well-developed and well-nourished. No distress.  HENT:  Head: Normocephalic and atraumatic.  Right Ear: External ear normal.  Left Ear: External ear normal.  Nose: Nose normal.  Eyes: Conjunctivae and EOM are normal.  Cardiovascular: Normal rate, regular rhythm and normal heart sounds.   Pulmonary/Chest: Effort normal and breath sounds normal.  Musculoskeletal: He exhibits edema.  Trace edema R medial ankle (puffiness). +1 dorsum of R foot. No edema on L. Could not appreciate DP pulse on R 2/2 edema  Neurological: He is alert.  Skin: Skin is dry. He is not diaphoretic.  Psychiatric: He has a normal mood and affect. His behavior is normal. Judgment and thought content normal.          Assessment & Plan:

## 2015-01-13 NOTE — Assessment & Plan Note (Signed)
Lab Results  Component Value Date   HGBA1C 6.2 01/12/2015    If Cr remains > 30 - 50 for some time, would consider adding metformin to help decrease cost burden of insulin.

## 2015-01-13 NOTE — Assessment & Plan Note (Signed)
We again reviewed his Cr curve and stressed current baseline OK but needs to stay at current baseline and not worsen.

## 2015-01-16 ENCOUNTER — Encounter: Payer: Self-pay | Admitting: Internal Medicine

## 2015-01-30 DIAGNOSIS — G4733 Obstructive sleep apnea (adult) (pediatric): Secondary | ICD-10-CM | POA: Diagnosis not present

## 2015-01-30 DIAGNOSIS — J343 Hypertrophy of nasal turbinates: Secondary | ICD-10-CM | POA: Diagnosis not present

## 2015-01-30 DIAGNOSIS — Z9989 Dependence on other enabling machines and devices: Secondary | ICD-10-CM | POA: Diagnosis not present

## 2015-01-30 DIAGNOSIS — R0981 Nasal congestion: Secondary | ICD-10-CM | POA: Diagnosis not present

## 2015-01-30 DIAGNOSIS — J351 Hypertrophy of tonsils: Secondary | ICD-10-CM | POA: Diagnosis not present

## 2015-02-14 ENCOUNTER — Ambulatory Visit: Payer: Medicare Other | Admitting: Internal Medicine

## 2015-02-14 ENCOUNTER — Encounter (HOSPITAL_COMMUNITY): Payer: Self-pay | Admitting: Emergency Medicine

## 2015-02-14 ENCOUNTER — Emergency Department (HOSPITAL_COMMUNITY)
Admission: EM | Admit: 2015-02-14 | Discharge: 2015-02-14 | Disposition: A | Discharge status: Home / Self Care | Payer: Medicare Other | Attending: Emergency Medicine | Admitting: Emergency Medicine

## 2015-02-14 DIAGNOSIS — Z7982 Long term (current) use of aspirin: Secondary | ICD-10-CM | POA: Diagnosis not present

## 2015-02-14 DIAGNOSIS — I129 Hypertensive chronic kidney disease with stage 1 through stage 4 chronic kidney disease, or unspecified chronic kidney disease: Secondary | ICD-10-CM | POA: Insufficient documentation

## 2015-02-14 DIAGNOSIS — N183 Chronic kidney disease, stage 3 (moderate): Secondary | ICD-10-CM | POA: Insufficient documentation

## 2015-02-14 DIAGNOSIS — G4733 Obstructive sleep apnea (adult) (pediatric): Secondary | ICD-10-CM | POA: Diagnosis not present

## 2015-02-14 DIAGNOSIS — I5032 Chronic diastolic (congestive) heart failure: Secondary | ICD-10-CM | POA: Insufficient documentation

## 2015-02-14 DIAGNOSIS — E1129 Type 2 diabetes mellitus with other diabetic kidney complication: Secondary | ICD-10-CM | POA: Diagnosis not present

## 2015-02-14 DIAGNOSIS — M79671 Pain in right foot: Secondary | ICD-10-CM | POA: Diagnosis present

## 2015-02-14 DIAGNOSIS — Z862 Personal history of diseases of the blood and blood-forming organs and certain disorders involving the immune mechanism: Secondary | ICD-10-CM | POA: Insufficient documentation

## 2015-02-14 DIAGNOSIS — Z79899 Other long term (current) drug therapy: Secondary | ICD-10-CM | POA: Diagnosis not present

## 2015-02-14 DIAGNOSIS — M10371 Gout due to renal impairment, right ankle and foot: Secondary | ICD-10-CM | POA: Diagnosis not present

## 2015-02-14 DIAGNOSIS — Z8719 Personal history of other diseases of the digestive system: Secondary | ICD-10-CM | POA: Diagnosis not present

## 2015-02-14 DIAGNOSIS — Z794 Long term (current) use of insulin: Secondary | ICD-10-CM | POA: Diagnosis not present

## 2015-02-14 DIAGNOSIS — Z87891 Personal history of nicotine dependence: Secondary | ICD-10-CM | POA: Diagnosis not present

## 2015-02-14 LAB — BASIC METABOLIC PANEL
Anion gap: 9 (ref 5–15)
BUN: 25 mg/dL — ABNORMAL HIGH (ref 6–20)
CO2: 27 mmol/L (ref 22–32)
Calcium: 9 mg/dL (ref 8.9–10.3)
Chloride: 105 mmol/L (ref 101–111)
Creatinine, Ser: 1.9 mg/dL — ABNORMAL HIGH (ref 0.61–1.24)
GFR calc Af Amer: 40 mL/min — ABNORMAL LOW (ref >60)
GFR calc non Af Amer: 34 mL/min — ABNORMAL LOW (ref >60)
Glucose, Bld: 162 mg/dL — ABNORMAL HIGH (ref 65–99)
Potassium: 3.2 mmol/L — ABNORMAL LOW (ref 3.5–5.1)
Sodium: 141 mmol/L (ref 135–145)

## 2015-02-14 LAB — CBC WITH DIFFERENTIAL/PLATELET
Basophils Absolute: 0.1 10*3/uL (ref 0.0–0.1)
Basophils Relative: 1 % (ref 0–1)
Eosinophils Absolute: 0.2 10*3/uL (ref 0.0–0.7)
Eosinophils Relative: 1 % (ref 0–5)
HCT: 34.8 % — ABNORMAL LOW (ref 39.0–52.0)
Hemoglobin: 11.5 g/dL — ABNORMAL LOW (ref 13.0–17.0)
Lymphocytes Relative: 17 % (ref 12–46)
Lymphs Abs: 1.9 10*3/uL (ref 0.7–4.0)
MCH: 30.7 pg (ref 26.0–34.0)
MCHC: 33 g/dL (ref 30.0–36.0)
MCV: 92.8 fL (ref 78.0–100.0)
Monocytes Absolute: 1.3 10*3/uL — ABNORMAL HIGH (ref 0.1–1.0)
Monocytes Relative: 12 % (ref 3–12)
Neutro Abs: 7.4 10*3/uL (ref 1.7–7.7)
Neutrophils Relative %: 69 % (ref 43–77)
Platelets: 149 10*3/uL — ABNORMAL LOW (ref 150–400)
RBC: 3.75 MIL/uL — ABNORMAL LOW (ref 4.22–5.81)
RDW: 13.1 % (ref 11.5–15.5)
WBC: 10.8 10*3/uL — ABNORMAL HIGH (ref 4.0–10.5)

## 2015-02-14 MED ORDER — PREDNISONE 10 MG PO TABS: 40.0000 mg | ORAL_TABLET | Freq: Every day | ORAL | Status: DC

## 2015-02-14 MED ORDER — HYDROCODONE-ACETAMINOPHEN 5-325 MG PO TABS: 1.0000 | ORAL_TABLET | Freq: Four times a day (QID) | ORAL | Status: DC | PRN

## 2015-02-14 MED ORDER — METHYLPREDNISOLONE SODIUM SUCC 125 MG IJ SOLR
125.0000 mg | Freq: Once | INTRAMUSCULAR | Status: AC
  Administered 2015-02-14: 125 mg via INTRAVENOUS
  Filled 2015-02-14: qty 2

## 2015-02-14 NOTE — ED Notes (Signed)
Patient states he has been out of colchicine in a week.

## 2015-02-14 NOTE — ED Notes (Signed)
Pt. Stated, Im still having foot pain and swelling since May.

## 2015-02-14 NOTE — ED Provider Notes (Signed)
CSN: [id number]     Arrival date & time 02/14/15  0845 History   First MD Initiated Contact with Patient 02/14/15 [phone]     Chief Complaint  Patient presents with  . Foot Pain     Patient is a 69 y.o. male presenting with lower extremity pain. The history is provided by the patient. No language interpreter was used.  Foot Pain   Mr. Colgan presents for evaluation of right foot pain. He's had 2 days of swelling and pain to the right great toe and right lateral ankle. He states this feels like his prior gout attacks. He took 2 doses of colchicine yesterday without improvement of symptoms. He is currently out of colchicine. He denies any injuries to the foot. He denies chest pain, abdominal pain, vomiting, fevers, myalgias. He has chronic shortness of breath the last year this is unchanged since baseline. Symptoms are moderate, constant, worsening.  Past Medical History  Diagnosis Date  . Essential hypertension 10/09/2014    Poor control with 5 drug therapy.   . DM (diabetes mellitus), type 2 with renal complications 12/09/158  . Pulmonary HTN 10/18/2014    Noted as severe ECHO 2016. Felt to be 2/2 untreated OSA. Further W/U needed.   . OSA on CPAP 10/08/2014  . Chronic renal failure, stage 3 (moderate) 10/18/2014    Baseline Cr about 1.5. Stable from 2010 to 2016.   Marland Kitchen Chronic diastolic heart failure 07/16/3233    Noted ECHO 10/2014. Grade 2. EF 50-55%   . Anemia 10/18/2014    Baseline about 12 and stable from 2010 to 2016. Colon 2009 in [place] (records cannot be obtained).  EGD Dr Benson [place] 2011 nl   . Former tobacco use 10/08/2014  . Diverticulosis 10/08/2014    Seen on CT. Reportedly on Colon in [place] in 2009. Freq bouts of diverticulitis.   Past Surgical History  Procedure Laterality Date  . Cholecystectomy     No family history on file. History  Substance Use Topics  . Smoking status: Former Smoker    Quit date: 10/02/1972  . Smokeless tobacco: Never Used  . Alcohol Use: No    Review of Systems   All other systems reviewed and are negative.     Allergies  Spironolactone  Home Medications   Prior to Admission medications   Medication Sig Start Date End Date Taking? Authorizing Provider  albuterol (PROVENTIL HFA;VENTOLIN HFA) 108 (90 BASE) MCG/ACT inhaler Inhale 1-2 puffs into the lungs every 6 (six) hours as needed for wheezing or shortness of breath. 12/08/14   Bartholomew Crews, MD  alum & mag hydroxide-simeth (MAALOX/MYLANTA) 200-200-20 MG/5ML suspension Take 30 mLs by mouth every 6 (six) hours as needed for indigestion or heartburn (dyspepsia). Patient not taking: Reported on 10/19/2014 10/13/14   Velta Addison Mikhail, DO  aspirin 81 MG chewable tablet Chew 1 tablet (81 mg total) by mouth daily. Patient not taking: Reported on 12/08/2014 10/13/14   Velta Addison Mikhail, DO  cloNIDine (CATAPRES) 0.1 MG tablet Take 1 tablet (0.1 mg total) by mouth 3 (three) times daily. 12/08/14   Bartholomew Crews, MD  docusate sodium (COLACE) 100 MG capsule Take 1 capsule (100 mg total) by mouth 2 (two) times daily. Patient not taking: Reported on 01/05/2015 10/13/14   Velta Addison Mikhail, DO  eplerenone (INSPRA) 50 MG tablet Take 1 tablet (50 mg total) by mouth daily. 12/08/14   Bartholomew Crews, MD  glipiZIDE (GLUCOTROL) 5 MG tablet Take 1 tablet (5 mg total) by mouth daily.  12/08/14   Bartholomew Crews, MD  hydrALAZINE (APRESOLINE) 50 MG tablet Take 1 tablet (50 mg total) by mouth every 8 (eight) hours. 12/08/14   Bartholomew Crews, MD  Insulin Detemir (LEVEMIR) 100 UNIT/ML Pen Inject 20 Units into the skin daily at 10 pm. 12/08/14   Bartholomew Crews, MD  labetalol (NORMODYNE) 200 MG tablet Take 2 tablets (400 mg total) by mouth 2 (two) times daily. 12/08/14   Bartholomew Crews, MD  minoxidil (LONITEN) 10 MG tablet Take 2 tablets (20 mg total) by mouth daily. 12/08/14   Bartholomew Crews, MD  Tetrahydrozoline HCl (EYE DROPS OP) Apply 1 drop to eye 2 (two) times daily.    Historical Provider, MD  torsemide  (DEMADEX) 10 MG tablet Take 1 tablet (10 mg total) by mouth 2 (two) times daily. 01/05/15   Davonna Belling, MD  verapamil (VERELAN PM) 360 MG 24 hr capsule Take 1 capsule (360 mg total) by mouth daily. 12/08/14   Bartholomew Crews, MD   BP 129/49 mmHg  Pulse 72  Temp(Src) 99.3 F (37.4 C)  Resp 17  Ht _0  (1.676 m)  Wt 210 lb (95.255 kg)  BMI 33.91 kg/m2  SpO2 96% Physical Exam  Constitutional: He is oriented to person, place, and time. He appears well-developed and well-nourished.  HENT:  Head: Normocephalic and atraumatic.  Cardiovascular: Normal rate and regular rhythm.   Systolic ejection murmur  Pulmonary/Chest: Effort normal and breath sounds normal. No respiratory distress.  Abdominal: Soft. There is no tenderness. There is no rebound and no guarding.  Musculoskeletal:  Moderate diffuse swelling to the right foot with pitting edema to the foot and ankle. There is no erythema over the foot. 2+ DP pulses bilaterally. Right lateral ankle with tenderness to palpation inferiorly. Significant tenderness to palpation over the great toe IP joint. No additional considerable foot tenderness to palpation. No cellulitis, no rash, no wound.  Neurological: He is alert and oriented to person, place, and time.  Skin: Skin is warm and dry.  Psychiatric: He has a normal mood and affect. His behavior is normal.  Nursing note and vitals reviewed.   ED Course  Procedures (including critical care time) Labs Review Labs Reviewed  BASIC METABOLIC PANEL - Abnormal; Notable for the following:    Potassium 3.2 (*)    Glucose, Bld 162 (*)    BUN 25 (*)    Creatinine, Ser 1.90 (*)    GFR calc non Af Amer 34 (*)    GFR calc Af Amer 40 (*)    All other components within normal limits  CBC WITH DIFFERENTIAL/PLATELET - Abnormal; Notable for the following:    WBC 10.8 (*)    RBC 3.75 (*)    Hemoglobin 11.5 (*)    HCT 34.8 (*)    Platelets 149 (*)    Monocytes Absolute 1.3 (*)    All other  components within normal limits    Imaging Review No results found.   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  Exam is consistent with acute gouty arthritis of the great toe as well as ankle. There is no evidence of septic arthritis, DVT, cellulitis, abscess. Patient does have chronic renal insufficiency, treated with prednisone as opposed to colchicine due to this. Discussed home care and return precautions.  Quintella Reichert, MD 02/14/15 1006

## 2015-02-14 NOTE — Discharge Instructions (Signed)

## 2015-02-16 ENCOUNTER — Telehealth: Payer: Self-pay | Admitting: Student-PharmD

## 2015-02-16 ENCOUNTER — Ambulatory Visit (INDEPENDENT_AMBULATORY_CARE_PROVIDER_SITE_OTHER): Payer: Medicare Other | Admitting: Internal Medicine

## 2015-02-16 VITALS — BP 130/48 | HR 67 | Temp 98.1°F | Ht 66.0 in | Wt 216.1 lb

## 2015-02-16 DIAGNOSIS — N189 Chronic kidney disease, unspecified: Secondary | ICD-10-CM | POA: Diagnosis not present

## 2015-02-16 DIAGNOSIS — G4733 Obstructive sleep apnea (adult) (pediatric): Secondary | ICD-10-CM

## 2015-02-16 DIAGNOSIS — M10071 Idiopathic gout, right ankle and foot: Secondary | ICD-10-CM | POA: Diagnosis not present

## 2015-02-16 DIAGNOSIS — Z9989 Dependence on other enabling machines and devices: Secondary | ICD-10-CM | POA: Diagnosis not present

## 2015-02-16 DIAGNOSIS — M1000 Idiopathic gout, unspecified site: Secondary | ICD-10-CM

## 2015-02-16 MED ORDER — COLCHICINE 0.6 MG PO TABS: ORAL_TABLET | ORAL | Status: DC

## 2015-02-16 NOTE — Patient Instructions (Signed)
Thank you for coming in today. I have sent you in a prescription for Colchicine today. Take 2 tablets for the initial flare followed by 1 tablet an hour later. You can then take 1 tablet every 4 hours as needed until the side effects become too great.   Gout Gout is an inflammatory arthritis caused by a buildup of uric acid crystals in the joints. Uric acid is a chemical that is normally present in the blood. When the level of uric acid in the blood is too high it can form crystals that deposit in your joints and tissues. This causes joint redness, soreness, and swelling (inflammation). Repeat attacks are common. Over time, uric acid crystals can form into masses (tophi) near a joint, destroying bone and causing disfigurement. Gout is treatable and often preventable. CAUSES  The disease begins with elevated levels of uric acid in the blood. Uric acid is produced by your body when it breaks down a naturally found substance called purines. Certain foods you eat, such as meats and fish, contain high amounts of purines. Causes of an elevated uric acid level include:  Being passed down from parent to child (heredity).  Diseases that cause increased uric acid production (such as obesity, psoriasis, and certain cancers).  Excessive alcohol use.  Diet, especially diets rich in meat and seafood.  Medicines, including certain cancer-fighting medicines (chemotherapy), water pills (diuretics), and aspirin.  Chronic kidney disease. The kidneys are no longer able to remove uric acid well.  Problems with metabolism. Conditions strongly associated with gout include:  Obesity.  High blood pressure.  High cholesterol.  Diabetes. Not everyone with elevated uric acid levels gets gout. It is not understood why some people get gout and others do not. Surgery, joint injury, and eating too much of certain foods are some of the factors that can lead to gout attacks. SYMPTOMS   An attack of gout comes on  quickly. It causes intense pain with redness, swelling, and warmth in a joint.  Fever can occur.  Often, only one joint is involved. Certain joints are more commonly involved:  Base of the big toe.  Knee.  Ankle.  Wrist.  Finger. Without treatment, an attack usually goes away in a few days to weeks. Between attacks, you usually will not have symptoms, which is different from many other forms of arthritis. DIAGNOSIS  Your caregiver will suspect gout based on your symptoms and exam. In some cases, tests may be recommended. The tests may include:  Blood tests.  Urine tests.  X-rays.  Joint fluid exam. This exam requires a needle to remove fluid from the joint (arthrocentesis). Using a microscope, gout is confirmed when uric acid crystals are seen in the joint fluid. TREATMENT  There are two phases to gout treatment: treating the sudden onset (acute) attack and preventing attacks (prophylaxis).  Treatment of an Acute Attack.  Medicines are used. These include anti-inflammatory medicines or steroid medicines.  An injection of steroid medicine into the affected joint is sometimes necessary.  The painful joint is rested. Movement can worsen the arthritis.  You may use warm or cold treatments on painful joints, depending which works best for you.  Treatment to Prevent Attacks.  If you suffer from frequent gout attacks, your caregiver may advise preventive medicine. These medicines are started after the acute attack subsides. These medicines either help your kidneys eliminate uric acid from your body or decrease your uric acid production. You may need to stay on these medicines for a  very long time.  The early phase of treatment with preventive medicine can be associated with an increase in acute gout attacks. For this reason, during the first few months of treatment, your caregiver may also advise you to take medicines usually used for acute gout treatment. Be sure you understand  your caregiver's directions. Your caregiver may make several adjustments to your medicine dose before these medicines are effective.  Discuss dietary treatment with your caregiver or dietitian. Alcohol and drinks high in sugar and fructose and foods such as meat, poultry, and seafood can increase uric acid levels. Your caregiver or dietitian can advise you on drinks and foods that should be limited. HOME CARE INSTRUCTIONS   Do not take aspirin to relieve pain. This raises uric acid levels.  Only take over-the-counter or prescription medicines for pain, discomfort, or fever as directed by your caregiver.  Rest the joint as much as possible. When in bed, keep sheets and blankets off painful areas.  Keep the affected joint raised (elevated).  Apply warm or cold treatments to painful joints. Use of warm or cold treatments depends on which works best for you.  Use crutches if the painful joint is in your leg.  Drink enough fluids to keep your urine clear or pale yellow. This helps your body get rid of uric acid. Limit alcohol, sugary drinks, and fructose drinks.  Follow your dietary instructions. Pay careful attention to the amount of protein you eat. Your daily diet should emphasize fruits, vegetables, whole grains, and fat-free or low-fat milk products. Discuss the use of coffee, vitamin C, and cherries with your caregiver or dietitian. These may be helpful in lowering uric acid levels.  Maintain a healthy body weight. SEEK MEDICAL CARE IF:   You develop diarrhea, vomiting, or any side effects from medicines.  You do not feel better in 24 hours, or you are getting worse. SEEK IMMEDIATE MEDICAL CARE IF:   Your joint becomes suddenly more tender, and you have chills or a fever. MAKE SURE YOU:   Understand these instructions.  Will watch your condition.  Will get help right away if you are not doing well or get worse. Document Released: 06/21/2000 Document Revised: 11/08/2013 Document  Reviewed: 02/05/2012 [facility] Patient Information 2015 [place], [place]. This information is not intended to replace advice given to you by your health care provider. Make sure you discuss any questions you have with your health care provider.

## 2015-02-16 NOTE — Assessment & Plan Note (Addendum)
Patient reports that he began having a gout flare in his right great toe on 8/7. He reports that colchicine is usually effective for him when he has flares but that he only had 2 pills of colchicine 0.6 mg and this was not enough. The pain continued and he was unable to walk on his right foot. He went to the ED the following day and was treated with prednisone 40 mg for 4 days. He comes in today with improvement in symptoms. Only mild tenderness in the right great toe IP joint. He is able to walk without difficulty. He reports he has 3-4 attacks a year but does not wish to start any prophylaxis at this time and would prefer to have something on hand for when he does have flares. He reports that colchicine is the most effective for him and requests a prescription for this to use as needed.  - Will provide a prescription for Colchicine 0.6 mg prn gout flares - Will need monitoring of his renal function. GFR at ED visit was 40, no dose adjustment needed for GFR >30.  - 10/13 visit already scheduled with Dr. Lynnae January

## 2015-02-16 NOTE — Telephone Encounter (Signed)
Steroid-Induced Hyperglycemia Prevention and Management Dylan [name] is a 69 y.o. male who meets criteria for [facility] quality improvement program (diabetes patient prescribed short courses of oral steroids).  A/P Current Regimen  Patient prescribed prednisone 40 mg daily x 4 days, currently on day 3 of therapy. Patient taking prednisone in the AM  Current DM regimen:  Glipizide 5 mg/d and Levemir 20 units qHS  DM regimen prior to steroid course: same  Home BG Monitoring  Patient does not check BG at home and does have a meter at home.  He ran out of test strips and has not called in for a refill.  CBGs at home n/a  Medication Management  Switch prednisone dose to AM not applicable  Additional treatment for BG control is not indicated at this time.   Medication supply Patient will use own medication  Patient Education  Advised patient to monitor BG while on steroid therapy (at least twice daily prior to first 2 meals of the day).  Patient educated about signs/symptoms and advised to contact clinic if hyper- or hypoglycemic.  Patient did  verbalize understanding of information and regimen by repeating back topics discussed.  Follow-up Patient will call clinic if SMBG numbers are high  none  Viann Fish 1:49 PM 02/16/2015

## 2015-02-16 NOTE — Progress Notes (Signed)
Patient ID: Dylan [name], male   DOB: 14-Mar-1946, 69 y.o.   MRN: [id number]   Subjective:   Patient ID: Dylan [name] male   DOB: 05/10/1946 69 y.o.   MRN: [id number]  HPI: [name] is a 69 y.o. male with a PMH of HTN, DM type 2, OSA with pulmonary HTN, Chronic dCHF, anemia and gout here today for ED follow up for a gout flare.   Past Medical History  Diagnosis Date  . Essential hypertension 10/09/2014    Poor control with 5 drug therapy.   . DM (diabetes mellitus), type 2 with renal complications 10/10/8183  . Pulmonary HTN 10/18/2014    Noted as severe ECHO 2016. Felt to be 2/2 untreated OSA. Further W/U needed.   . OSA on CPAP 10/08/2014  . Chronic renal failure, stage 3 (moderate) 10/18/2014    Baseline Cr about 1.5. Stable from 2010 to 2016.   Marland Kitchen Chronic diastolic heart failure 6/31/4970    Noted ECHO 10/2014. Grade 2. EF 50-55%   . Anemia 10/18/2014    Baseline about 12 and stable from 2010 to 2016. Colon 2009 in [place] (records cannot be obtained).  EGD Dr Benson [place] 2011 nl   . Former tobacco use 10/08/2014  . Diverticulosis 10/08/2014    Seen on CT. Reportedly on Colon in [place] in 2009. Freq bouts of diverticulitis.   Current Outpatient Prescriptions  Medication Sig Dispense Refill  . albuterol (PROVENTIL HFA;VENTOLIN HFA) 108 (90 BASE) MCG/ACT inhaler Inhale 1-2 puffs into the lungs every 6 (six) hours as needed for wheezing or shortness of breath. 3 Inhaler 0  . alum & mag hydroxide-simeth (MAALOX/MYLANTA) 200-200-20 MG/5ML suspension Take 30 mLs by mouth every 6 (six) hours as needed for indigestion or heartburn (dyspepsia). (Patient not taking: Reported on 10/19/2014) 355 mL 0  . aspirin 81 MG chewable tablet Chew 1 tablet (81 mg total) by mouth daily. (Patient not taking: Reported on 12/08/2014)    . cloNIDine (CATAPRES) 0.1 MG tablet Take 1 tablet (0.1 mg total) by mouth 3 (three) times daily. 90 tablet 3  . docusate sodium (COLACE) 100 MG capsule Take 1 capsule (100 mg total) by mouth 2 (two) times  daily. (Patient not taking: Reported on 01/05/2015) 10 capsule 0  . eplerenone (INSPRA) 50 MG tablet Take 1 tablet (50 mg total) by mouth daily. 90 tablet 3  . glipiZIDE (GLUCOTROL) 5 MG tablet Take 1 tablet (5 mg total) by mouth daily. 90 tablet 3  . hydrALAZINE (APRESOLINE) 50 MG tablet Take 1 tablet (50 mg total) by mouth every 8 (eight) hours. 270 tablet 3  . HYDROcodone-acetaminophen (NORCO/VICODIN) 5-325 MG per tablet Take 1 tablet by mouth every 6 (six) hours as needed. 4 tablet 0  . Insulin Detemir (LEVEMIR) 100 UNIT/ML Pen Inject 20 Units into the skin daily at 10 pm. 30 mL 3  . labetalol (NORMODYNE) 200 MG tablet Take 2 tablets (400 mg total) by mouth 2 (two) times daily. 360 tablet 3  . minoxidil (LONITEN) 10 MG tablet Take 2 tablets (20 mg total) by mouth daily. 180 tablet 3  . predniSONE (DELTASONE) 10 MG tablet Take 4 tablets (40 mg total) by mouth daily. 16 tablet 0  . Tetrahydrozoline HCl (EYE DROPS OP) Apply 1 drop to eye 2 (two) times daily.    Marland Kitchen torsemide (DEMADEX) 10 MG tablet Take 1 tablet (10 mg total) by mouth 2 (two) times daily. 20 tablet 0  . verapamil (VERELAN PM) 360 MG 24 hr capsule Take 1  capsule (360 mg total) by mouth daily. 90 capsule 3   No current facility-administered medications for this visit.   No family history on file. Social History   Social History  . Marital Status: Legally Separated    Spouse Name: N/A  . Number of Children: N/A  . Years of Education: N/A   Social History Main Topics  . Smoking status: Former Smoker    Quit date: 10/02/1972  . Smokeless tobacco: Never Used  . Alcohol Use: No  . Drug Use: No  . Sexual Activity: No   Other Topics Concern  . 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 Has male sig other. Likes to go fishing.   Review of Systems: Review of Systems  Constitutional: Negative for fever and chills.  Eyes:  Negative for blurred vision.  Respiratory: Negative for cough and shortness of breath.   Cardiovascular: Negative for chest pain.  Gastrointestinal: Negative for nausea, vomiting, abdominal pain, diarrhea and constipation.  Skin: Negative for rash.   Objective:  Physical Exam: Filed Vitals:   02/16/15 0824  BP: 130/48  Pulse: 67  Temp: 98.1 F (36.7 C)  TempSrc: Oral  Height: _0  (1.676 m)  Weight: 216 lb 1.6 oz (98.022 kg)   Physical Exam GENERAL- alert, co-operative, appears as stated age, not in any distress. CARDIAC- RRR, no murmurs, rubs or gallops. RESP- Moving equal volumes of air, and clear to auscultation bilaterally, no wheezes or crackles. ABDOMEN- Soft, nontender, no guarding or rebound, no palpable masses or organomegaly, bowel sounds present. NEURO- No obvious Cr N abnormality EXTREMITIES- Trace edema in R medial ankle and foot. No edema on left. 2+ DP pulses bilaterally. Mild tenderness to palpation over the right great toe IP joint.  SKIN- Warm, dry, No rash or lesion. PSYCH- Normal mood and affect, appropriate thought content and speech.  Assessment & Plan:   Case discussed with Dr. Evette Doffing. Please refer to Problem based carting for further details of today's visit.

## 2015-02-16 NOTE — Assessment & Plan Note (Addendum)
Patient reports that his insurance is unable to get a CPAP machine. He has had the sleep study done and has severe OSA. Had potentially correctable upper airway obstruction contributing to the need for unusually high pressures. May need an ENT evaluation.

## 2015-02-17 NOTE — Progress Notes (Signed)
Internal Medicine Clinic Attending  I saw and evaluated the patient.  I personally confirmed the key portions of the history and exam documented by Dr. Charlynn Grimes and I reviewed pertinent patient test results.  The assessment, diagnosis, and plan were formulated together and I agree with the documentation in the resident's note.  Given level of CKD, we advised the patient that he would benefit from uric acid lowering therapy as it could slow the progression of further renal insufficiency. He is going to continue to think about this option and follow up with Dr. Lynnae January soon. His difficult to control hypertension remains his largest problem, but stable on current regimen.

## 2015-02-17 NOTE — Addendum Note (Signed)
Addended by: Lalla Brothers T on: 02/17/2015 10:05 AM   Modules accepted: Level of Service

## 2015-02-20 ENCOUNTER — Ambulatory Visit: Payer: Self-pay | Admitting: Student-PharmD

## 2015-03-08 ENCOUNTER — Encounter: Payer: Self-pay | Admitting: Internal Medicine

## 2015-03-08 DIAGNOSIS — G4733 Obstructive sleep apnea (adult) (pediatric): Secondary | ICD-10-CM

## 2015-03-08 NOTE — Assessment & Plan Note (Signed)
Asked to provide additional documentation to justify pt's need for BiPAP for insurance. Pt had sleep study in July and failed CPAP. At CPAP pf 13. AHI was 78 and RAI 13, with desats to 79%. Placed on BiPAP and titrated to 21/17 and RAI 0 with lowest desat of 90%.

## 2015-03-20 ENCOUNTER — Other Ambulatory Visit: Payer: Self-pay | Admitting: Internal Medicine

## 2015-03-20 DIAGNOSIS — I10 Essential (primary) hypertension: Secondary | ICD-10-CM

## 2015-03-20 NOTE — Telephone Encounter (Signed)
[facility] on [address].  Need blood pressure medication has been out for 6 days.

## 2015-03-21 MED ORDER — CLONIDINE HCL 0.1 MG PO TABS: 0.1000 mg | ORAL_TABLET | Freq: Three times a day (TID) | ORAL | Status: DC

## 2015-04-20 ENCOUNTER — Ambulatory Visit (INDEPENDENT_AMBULATORY_CARE_PROVIDER_SITE_OTHER): Payer: Medicare Other | Admitting: Internal Medicine

## 2015-04-20 ENCOUNTER — Encounter: Payer: Self-pay | Admitting: Internal Medicine

## 2015-04-20 ENCOUNTER — Telehealth: Payer: Self-pay | Admitting: *Deleted

## 2015-04-20 VITALS — BP 92/48 | HR 74 | Temp 98.4°F | Wt 207.1 lb

## 2015-04-20 DIAGNOSIS — E669 Obesity, unspecified: Secondary | ICD-10-CM | POA: Diagnosis not present

## 2015-04-20 DIAGNOSIS — E1122 Type 2 diabetes mellitus with diabetic chronic kidney disease: Secondary | ICD-10-CM | POA: Diagnosis not present

## 2015-04-20 DIAGNOSIS — Z23 Encounter for immunization: Secondary | ICD-10-CM

## 2015-04-20 DIAGNOSIS — I10 Essential (primary) hypertension: Secondary | ICD-10-CM | POA: Diagnosis not present

## 2015-04-20 DIAGNOSIS — N183 Chronic kidney disease, stage 3 unspecified: Secondary | ICD-10-CM

## 2015-04-20 DIAGNOSIS — M1000 Idiopathic gout, unspecified site: Secondary | ICD-10-CM | POA: Diagnosis not present

## 2015-04-20 DIAGNOSIS — Z0000 Encounter for general adult medical examination without abnormal findings: Secondary | ICD-10-CM

## 2015-04-20 DIAGNOSIS — Z1159 Encounter for screening for other viral diseases: Secondary | ICD-10-CM

## 2015-04-20 DIAGNOSIS — I739 Peripheral vascular disease, unspecified: Secondary | ICD-10-CM | POA: Diagnosis not present

## 2015-04-20 DIAGNOSIS — E1129 Type 2 diabetes mellitus with other diabetic kidney complication: Secondary | ICD-10-CM

## 2015-04-20 LAB — POCT GLYCOSYLATED HEMOGLOBIN (HGB A1C): Hemoglobin A1C: 7.9

## 2015-04-20 LAB — GLUCOSE, CAPILLARY: Glucose-Capillary: 295 mg/dL — ABNORMAL HIGH (ref 65–99)

## 2015-04-20 MED ORDER — GLIPIZIDE 5 MG PO TABS: 10.0000 mg | ORAL_TABLET | Freq: Two times a day (BID) | ORAL | Status: DC

## 2015-04-20 NOTE — Patient Instructions (Signed)
1. Come back in one month 2. Check your sugar every morning. Once your sugar is between 120-140 - call us. 3. If you feel like your sugar is low, please check your sugar and call us. 4. I am arranging for a test to check the circulation in your legs.

## 2015-04-20 NOTE — Assessment & Plan Note (Signed)
He has last 13 lbs since June. He doesn't know how. The only change he made was drinking grapefruit juice but it is sweetened. But moving more as he drives his grand kids to school activities and runs errands for daughter. Has to go up stairs more often. He has no B sxs and I doubt his weight loss is 2/2 to anything other than increased movement. He is UTD on colon and CXR in June nl. Encouraged continued movement and weight loss.

## 2015-04-20 NOTE — Assessment & Plan Note (Signed)
We discussed hep C testing and he agreed today. Flu and PCV vaccination today.

## 2015-04-20 NOTE — Assessment & Plan Note (Signed)
A1C increased from 6.2 to 7.9. He had been on prednisone 40 mg for 4 days in Aug for gout. He had previously been well controlled on his levemir 20 and glucotrol 5 mg. Despite 13 lb weight loss his control has deteriorated. I think this is all 2/2 the prednisone. Checks CBG once a day fasting and run 171 - 354. There doesn't seem to be a decrease recently despite being off prednisone. I am going to increase glipizide to 10 BID. He is to check CBG at least fasting, more often if needed of hypoglycemic sxs. Once he notices fasting CBG at 150, he is to start paying close attention and call us once CBG 120 - 140 range as we might need to cut back. Handwritten instructions provided .I do not think he understands CGB's well bc thought 50 was OK level.

## 2015-04-20 NOTE — Telephone Encounter (Signed)
Call made to patient informing him of an appt scheduled for Nov 10th at [facility] appts avail but pt wanted to get done on the same day he sees Psychiatric nurse..kg

## 2015-04-20 NOTE — Assessment & Plan Note (Signed)
Check BMP today 

## 2015-04-20 NOTE — Assessment & Plan Note (Signed)
I need to talk to [place] about his BiPap as insurance will not pay.

## 2015-04-20 NOTE — Assessment & Plan Note (Signed)
BP on arrival was 92/48. Orthostatics were 130/57 71 lying; 116/57 70 sitting; 107/55 72 standing. He has asymptomatic orthostatic hypotension. Regimen is  Clonidine 0.1 TID Eplerone 50 QD Hydral 50 Q8 Labetolol 400 BID Verap 360 QD Demedex 10 AM, 20 PM  BP has been difficult to control. In June, systolic was 902. However, lost 13 lbs since then and BP and weight have paralleled each other downwards. I think his weight loss has really helped his BP and I tried to really explained his, congraduatae him, and encourage him to cont weight loss. Despite orthostatics, I did not change meds bc symptomatic. He is coming back in one month for recheck. If still low, will need to cut back on meds. Will ask him which is more annoying - the cost of the eplerone, the TID dosing, or drowsiness of the clonidine. BMP today.

## 2015-04-20 NOTE — Assessment & Plan Note (Signed)
Had gout flare R great toe and ankle in Aug and tx prednisone and colchicine. States another gout flare 1 week ago but was L anterior lower leg - not a joint. But he took 4 colchicine and it resolved. Explained gout is in joint. Has colchicine to use PRN.

## 2015-04-20 NOTE — Progress Notes (Signed)
   Subjective:    Patient ID: Dylan [name], male    DOB: 1945/07/16, 69 y.o.   MRN: [id number]  HPI  Dadrian Ballantine is here for HTN F/U. Please see the A&P for the status of the pt's chronic medical problems.   Review of Systems  Constitutional: Positive for activity change and unexpected weight change. Negative for diaphoresis and appetite change.       + drowsy after meds  HENT: Positive for rhinorrhea and sneezing.   Eyes: Positive for itching.  Respiratory: Positive for cough and shortness of breath.   Cardiovascular: Negative for chest pain and leg swelling.  Musculoskeletal: Negative for gait problem.       Not walking enough to get claudication sxs.  Skin: Negative for rash.       Sloughed skin R ankle while back, now nl  Neurological: Negative for dizziness, light-headedness and headaches.       No falls, unsteady gait  Psychiatric/Behavioral: Negative for behavioral problems.       Wakes up every couple of hours       Objective:   Physical Exam  Constitutional: He is oriented to person, place, and time. He appears well-developed and well-nourished. No distress.  HENT:  Head: Normocephalic and atraumatic.  Right Ear: External ear normal.  Left Ear: External ear normal.  Nose: Nose normal.  Eyes: Conjunctivae and EOM are normal.  Cardiovascular: Normal rate and regular rhythm.   Murmur heard. Pulmonary/Chest: Effort normal and breath sounds normal.  Abdominal: Bowel sounds are normal.  Obese  Musculoskeletal: Normal range of motion. He exhibits no edema.  Neurological: He is alert and oriented to person, place, and time.  Skin: Skin is warm and dry. He is not diaphoretic.  Psychiatric: He has a normal mood and affect. His behavior is normal. Judgment and thought content normal.        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  Assessment & Plan:

## 2015-04-20 NOTE — Assessment & Plan Note (Signed)
No longer getting sxs [place]. Will proceed with ABI's and pt in agreement.

## 2015-04-21 ENCOUNTER — Encounter: Payer: Self-pay | Admitting: Internal Medicine

## 2015-04-21 LAB — BMP8+ANION GAP
Anion Gap: 17 mmol/L (ref 10.0–18.0)
BUN/Creatinine Ratio: 13 (ref 10–22)
BUN: 27 mg/dL (ref 8–27)
CO2: 24 mmol/L (ref 18–29)
Calcium: 9 mg/dL (ref 8.6–10.2)
Chloride: 99 mmol/L (ref 97–108)
Creatinine, Ser: 2.13 mg/dL — ABNORMAL HIGH (ref 0.76–1.27)
GFR calc Af Amer: 35 mL/min/{1.73_m2} — ABNORMAL LOW (ref >59)
GFR calc non Af Amer: 30 mL/min/{1.73_m2} — ABNORMAL LOW (ref >59)
Glucose: 307 mg/dL — ABNORMAL HIGH (ref 65–99)
Potassium: 4.6 mmol/L (ref 3.5–5.2)
Sodium: 140 mmol/L (ref 134–144)

## 2015-04-21 LAB — HEPATITIS C ANTIBODY: Hep C Virus Ab: <0.1 s/co ratio (ref 0.0–0.9)

## 2015-05-18 ENCOUNTER — Ambulatory Visit (INDEPENDENT_AMBULATORY_CARE_PROVIDER_SITE_OTHER): Payer: Medicare Other | Admitting: Internal Medicine

## 2015-05-18 ENCOUNTER — Encounter: Payer: Self-pay | Admitting: Internal Medicine

## 2015-05-18 ENCOUNTER — Ambulatory Visit (HOSPITAL_COMMUNITY)
Admission: RE | Admit: 2015-05-18 | Discharge: 2015-05-18 | Disposition: A | Discharge status: Home / Self Care | Payer: Medicare Other | Source: Ambulatory Visit | Attending: Internal Medicine | Admitting: Internal Medicine

## 2015-05-18 VITALS — BP 137/62 | HR 73 | Temp 98.8°F | Wt 210.3 lb

## 2015-05-18 DIAGNOSIS — Z87891 Personal history of nicotine dependence: Secondary | ICD-10-CM | POA: Diagnosis not present

## 2015-05-18 DIAGNOSIS — I10 Essential (primary) hypertension: Secondary | ICD-10-CM

## 2015-05-18 DIAGNOSIS — I739 Peripheral vascular disease, unspecified: Secondary | ICD-10-CM | POA: Diagnosis not present

## 2015-05-18 DIAGNOSIS — E1122 Type 2 diabetes mellitus with diabetic chronic kidney disease: Secondary | ICD-10-CM | POA: Diagnosis not present

## 2015-05-18 DIAGNOSIS — I129 Hypertensive chronic kidney disease with stage 1 through stage 4 chronic kidney disease, or unspecified chronic kidney disease: Secondary | ICD-10-CM | POA: Diagnosis not present

## 2015-05-18 DIAGNOSIS — H0520 Unspecified exophthalmos: Secondary | ICD-10-CM

## 2015-05-18 DIAGNOSIS — R519 Headache, unspecified: Secondary | ICD-10-CM

## 2015-05-18 DIAGNOSIS — I70213 Atherosclerosis of native arteries of extremities with intermittent claudication, bilateral legs: Secondary | ICD-10-CM | POA: Insufficient documentation

## 2015-05-18 DIAGNOSIS — N183 Chronic kidney disease, stage 3 unspecified: Secondary | ICD-10-CM

## 2015-05-18 DIAGNOSIS — R51 Headache: Secondary | ICD-10-CM | POA: Diagnosis not present

## 2015-05-18 HISTORY — DX: Unspecified exophthalmos: H05.20

## 2015-05-18 NOTE — Progress Notes (Signed)
VASCULAR LAB PRELIMINARY  ARTERIAL  ABI completed:    RIGHT    LEFT    PRESSURE WAVEFORM  PRESSURE WAVEFORM  BRACHIAL 145 Triphasic BRACHIAL 135 Triphasic  DP 59 Dampened monophasic DP 104 Monophasic  AT   AT    PT 81 Dampened monophasic PT 98 Monophasic  PER   PER    GREAT TOE 37 NA GREAT TOE 64 NA    RIGHT LEFT  ABI 0.56 0.72  TBI 0.26 0.44    The right ABI is suggestive of moderate, borderline severe arterial insufficiency. The left ABI is suggestive of moderate arterial insufficiency. Bilateral TBIs are abnormal.  05/18/2015 10:54 AM Maudry Mayhew, RVT, RDCS, RDMS

## 2015-05-18 NOTE — Assessment & Plan Note (Signed)
Repeat BMET today.

## 2015-05-18 NOTE — Patient Instructions (Signed)
STOP YOUR CLONIDINE.  TAKE ALL OTHER MEDICATIONS THE SAME.  MAKE AN APPOINTMENT WITH YOUR EYE DOCTOR.  FOLLOW UP ONE MONTH.

## 2015-05-18 NOTE — Progress Notes (Signed)
Subjective:    Patient ID: Dylan [name], male    DOB: 01/03/1946, 69 y.o.   MRN: [id number]  HPI Dylan [name] is a 69 y.o. male with PMHx of HTN, T2DM, chronic renal failure who presents to the clinic for follow up for HTN. Please see A&P for the status of the patient's chronic medical problems.   Past Medical History  Diagnosis Date  . Essential hypertension 10/09/2014    Poor control with 5 drug therapy.   . DM (diabetes mellitus), type 2 with renal complications ([place]) 12/09/1495  . Pulmonary HTN ([place]) 10/18/2014    Noted as severe ECHO 2016. Felt to be 2/2 untreated OSA. Further W/U needed.   . OSA on CPAP 10/08/2014  . Chronic renal failure, stage 3 (moderate) 10/18/2014    Baseline Cr about 1.5. Stable from 2010 to 2016.   Marland Kitchen Chronic diastolic heart failure ([place]) 10/18/2014    Noted ECHO 10/2014. Grade 2. EF 50-55%   . Anemia 10/18/2014    Baseline about 12 and stable from 2010 to 2016. Colon 2009 in [place] (records cannot be obtained).  EGD Dr Benson [place] 2011 nl   . Former tobacco use 10/08/2014  . Diverticulosis 10/08/2014    Seen on CT. Reportedly on Colon in [place] in 2009. Freq bouts of diverticulitis.    Outpatient Encounter Prescriptions as of 05/18/2015  Medication Sig  . albuterol (PROVENTIL HFA;VENTOLIN HFA) 108 (90 BASE) MCG/ACT inhaler Inhale 1-2 puffs into the lungs every 6 (six) hours as needed for wheezing or shortness of breath.  Marland Kitchen alum & mag hydroxide-simeth (MAALOX/MYLANTA) 200-200-20 MG/5ML suspension Take 30 mLs by mouth every 6 (six) hours as needed for indigestion or heartburn (dyspepsia). (Patient not taking: Reported on 10/19/2014)  . aspirin 81 MG chewable tablet Chew 1 tablet (81 mg total) by mouth daily. (Patient not taking: Reported on 12/08/2014)  . colchicine 0.6 MG tablet Take 2 tablets for initial flare and then 1 tablet an hour later. Take 1 tablet every 4 hours as needed until side effects become too great. (Patient not taking: Reported on 04/20/2015)  . docusate sodium (COLACE)  100 MG capsule Take 1 capsule (100 mg total) by mouth 2 (two) times daily. (Patient not taking: Reported on 01/05/2015)  . eplerenone (INSPRA) 50 MG tablet Take 1 tablet (50 mg total) by mouth daily.  Marland Kitchen glipiZIDE (GLUCOTROL) 5 MG tablet Take 2 tablets (10 mg total) by mouth 2 (two) times daily before a meal.  . hydrALAZINE (APRESOLINE) 50 MG tablet Take 1 tablet (50 mg total) by mouth every 8 (eight) hours.  Marland Kitchen HYDROcodone-acetaminophen (NORCO/VICODIN) 5-325 MG per tablet Take 1 tablet by mouth every 6 (six) hours as needed.  . Insulin Detemir (LEVEMIR) 100 UNIT/ML Pen Inject 20 Units into the skin daily at 10 pm.  . labetalol (NORMODYNE) 200 MG tablet Take 2 tablets (400 mg total) by mouth 2 (two) times daily.  . minoxidil (LONITEN) 10 MG tablet Take 2 tablets (20 mg total) by mouth daily.  . Tetrahydrozoline HCl (EYE DROPS OP) Apply 1 drop to eye 2 (two) times daily.  Marland Kitchen torsemide (DEMADEX) 10 MG tablet Take 1 tablet (10 mg total) by mouth 2 (two) times daily.  . verapamil (VERELAN PM) 360 MG 24 hr capsule Take 1 capsule (360 mg total) by mouth daily.  . [DISCONTINUED] cloNIDine (CATAPRES) 0.1 MG tablet Take 1 tablet (0.1 mg total) by mouth 3 (three) times daily.   No facility-administered encounter medications on file as of 05/18/2015.  No family history on file.  Social History   Social History  . Marital Status: Legally Separated    Spouse Name: N/A  . Number of Children: N/A  . Years of Education: N/A   Occupational History  . Not on file.   Social History Main Topics  . Smoking status: Former Smoker    Quit date: 10/02/1972  . Smokeless tobacco: Never Used  . Alcohol Use: No  . Drug Use: No  . Sexual Activity: No   Other Topics Concern  . 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 Has male sig other. Likes to go fishing.    Review of Systems General: Denies fever,  chills, fatigue  HEENT: Admits to "eyes popping out." Denies pain, erythema, itching, or vision changes. Respiratory: Denies SOB, cough   Cardiovascular: Denies chest pain and palpitations.  Endocrine: Denies hot or cold intolerance Skin: Denies pallor, rash and wounds.  Neurological: Admits to lightheadedness. Denies headaches, weakness, numbness, syncope.     Objective:   Physical Exam Filed Vitals:   05/18/15 0825  BP: 137/62  Pulse: 73  Temp: 98.8 F (37.1 C)  TempSrc: Oral  Weight: 210 lb 4.8 oz (95.391 kg)  SpO2: 98%   General: Vital signs reviewed.  Patient is well-developed and well-nourished, in no acute distress and cooperative with exam.  Eyes: Possible mild exophthalmos, sclera is not circumferentially around the iris. No lid lag. EOMI, conjunctivae normal, no scleral icterus, no evidence of infection.  Cardiovascular: RRR, S1 normal, S2 normal Pulmonary/Chest: Clear to auscultation bilaterally, no wheezes, rales, or rhonchi. Extremities: Trace lower extremity edema bilaterally. Skin: Warm, dry and intact. No rashes or erythema.    Assessment & Plan:   Please see problem based assessment and plan.

## 2015-05-18 NOTE — Assessment & Plan Note (Addendum)
Patient complains of 3 month history of his eyes "popping out." He states this normal occurs at night when he is laying down, he feels like his eye or eyes are coming out of socket and he has to push them back in. He denies any pain, headache, changes in vision. This has never happened to him before and he feel that it is getting worse. It scares him. Examination possibly reveals some mild exophthalmos, but no obvious proptosis, normal EOM, and no lid lag. TSH was previously normal in June, but exophthalmos can occur in euthyroid or before Grave's disease is detectable. This feeling does not particularly occur when he sneezes or coughs. No associated headaches. DDx includes Grave's disease, obesity, cushings, orbital myositis, histocytosis, statin-induced extraocular muscle myopathy.   Plan: -Non-urgent CT non-contrast orbits -Check TSH, Free T4 and thyrotropin antibodies -4 week follow up  Addendum: Called patient- TSH, Free T4 and thyrotropin antibodies  All normal. He has not seen the ophthalmologist yet. Plans to go tomorrow. Has not gotten the CT orbits done yet, but is aware where to go to get it completed.  -Patient to follow up with his ophthalmologist as well

## 2015-05-18 NOTE — Assessment & Plan Note (Addendum)
Patient continues to have claudication like symptoms in his right leg.   Plan: -ABI today at 10 am  Addendum: ABIs showed the right ABI is suggestive of moderate, borderline severe arterial insufficiency. The left ABI is suggestive of moderate arterial insufficiency. Bilateral TBIs are abnormal. Will let PCP know.

## 2015-05-18 NOTE — Addendum Note (Signed)
Addended by: Vickii Chafe on: 05/18/2015 08:49 PM   Modules accepted: Orders

## 2015-05-18 NOTE — Progress Notes (Unsigned)
Subjective:    Patient ID: Dylan [name], male    DOB: [birth year], 69 y.o.   MRN: [id number]  HPI Dylan [name] is a 69 y.o. male with PMHx of HTN, T2DM who presents to the clinic for . Please see A&P for the status of the patient's chronic medical problems.   Past Medical History  Diagnosis Date  . Essential hypertension 10/09/2014    Poor control with 5 drug therapy.   . DM (diabetes mellitus), type 2 with renal complications ([place]) 0/07/4101  . Pulmonary HTN ([place]) 10/18/2014    Noted as severe ECHO 2016. Felt to be 2/2 untreated OSA. Further W/U needed.   . OSA on CPAP 10/08/2014  . Chronic renal failure, stage 3 (moderate) 10/18/2014    Baseline Cr about 1.5. Stable from 2010 to 2016.   Marland Kitchen Chronic diastolic heart failure ([place]) 10/18/2014    Noted ECHO 10/2014. Grade 2. EF 50-55%   . Anemia 10/18/2014    Baseline about 12 and stable from 2010 to 2016. Colon 2009 in [place] (records cannot be obtained).  EGD Dr Benson [place] 2011 nl   . Former tobacco use 10/08/2014  . Diverticulosis 10/08/2014    Seen on CT. Reportedly on Colon in [place] in 2009. Freq bouts of diverticulitis.    Outpatient Encounter Prescriptions as of 05/18/2015  Medication Sig  . albuterol (PROVENTIL HFA;VENTOLIN HFA) 108 (90 BASE) MCG/ACT inhaler Inhale 1-2 puffs into the lungs every 6 (six) hours as needed for wheezing or shortness of breath.  Marland Kitchen alum & mag hydroxide-simeth (MAALOX/MYLANTA) 200-200-20 MG/5ML suspension Take 30 mLs by mouth every 6 (six) hours as needed for indigestion or heartburn (dyspepsia). (Patient not taking: Reported on 10/19/2014)  . aspirin 81 MG chewable tablet Chew 1 tablet (81 mg total) by mouth daily. (Patient not taking: Reported on 12/08/2014)  . cloNIDine (CATAPRES) 0.1 MG tablet Take 1 tablet (0.1 mg total) by mouth 3 (three) times daily.  . colchicine 0.6 MG tablet Take 2 tablets for initial flare and then 1 tablet an hour later. Take 1 tablet every 4 hours as needed until side effects become too great. (Patient not  taking: Reported on 04/20/2015)  . docusate sodium (COLACE) 100 MG capsule Take 1 capsule (100 mg total) by mouth 2 (two) times daily. (Patient not taking: Reported on 01/05/2015)  . eplerenone (INSPRA) 50 MG tablet Take 1 tablet (50 mg total) by mouth daily.  Marland Kitchen glipiZIDE (GLUCOTROL) 5 MG tablet Take 2 tablets (10 mg total) by mouth 2 (two) times daily before a meal.  . hydrALAZINE (APRESOLINE) 50 MG tablet Take 1 tablet (50 mg total) by mouth every 8 (eight) hours.  Marland Kitchen HYDROcodone-acetaminophen (NORCO/VICODIN) 5-325 MG per tablet Take 1 tablet by mouth every 6 (six) hours as needed.  . Insulin Detemir (LEVEMIR) 100 UNIT/ML Pen Inject 20 Units into the skin daily at 10 pm.  . labetalol (NORMODYNE) 200 MG tablet Take 2 tablets (400 mg total) by mouth 2 (two) times daily.  . minoxidil (LONITEN) 10 MG tablet Take 2 tablets (20 mg total) by mouth daily.  . Tetrahydrozoline HCl (EYE DROPS OP) Apply 1 drop to eye 2 (two) times daily.  Marland Kitchen torsemide (DEMADEX) 10 MG tablet Take 1 tablet (10 mg total) by mouth 2 (two) times daily.  . verapamil (VERELAN PM) 360 MG 24 hr capsule Take 1 capsule (360 mg total) by mouth daily.   No facility-administered encounter medications on file as of 05/18/2015.    No family history on file.  Social History   Social History  . Marital Status: Legally Separated    Spouse Name: N/A  . Number of Children: N/A  . Years of Education: N/A   Occupational History  . Not on file.   Social History Main Topics  . Smoking status: Former Smoker    Quit date: 10/02/1972  . Smokeless tobacco: Never Used  . Alcohol Use: No  . Drug Use: No  . Sexual Activity: No   Other Topics Concern  . 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 Has male sig other. Likes to go fishing.        Review of Systems     Objective:   Physical Exam        Assessment & Plan:

## 2015-05-18 NOTE — Assessment & Plan Note (Signed)
BP Readings from Last 3 Encounters:  05/18/15 137/62  04/20/15 92/48  02/16/15 130/48    Lab Results  Component Value Date   NA 140 04/20/2015   K 4.6 04/20/2015   CREATININE 2.13* 04/20/2015    Assessment: Blood pressure control:  Controlled Progress toward BP goal:   At goal Comments: Patient had low blood pressure at previous visit. He presents for follow up. He admits to lightheadedness after he takes his medications. Orthostatics were positive: laying 123/56 HR 64; sitting 116/56 HR 67; standing 103/56 HR 68; standing at 3 minutes 107/59 HR 72. Patient admitted to lightheadedness.   Plan: Medications:  continue current medications but discontinue clonidine Other plans: Follow up 4 weeks

## 2015-05-19 LAB — BMP8+ANION GAP
Anion Gap: 17 mmol/L (ref 10.0–18.0)
BUN/Creatinine Ratio: 13 (ref 10–22)
BUN: 25 mg/dL (ref 8–27)
CO2: 22 mmol/L (ref 18–29)
Calcium: 8.9 mg/dL (ref 8.6–10.2)
Chloride: 96 mmol/L — ABNORMAL LOW (ref 97–106)
Creatinine, Ser: 1.98 mg/dL — ABNORMAL HIGH (ref 0.76–1.27)
GFR calc Af Amer: 38 mL/min/{1.73_m2} — ABNORMAL LOW (ref >59)
GFR calc non Af Amer: 33 mL/min/{1.73_m2} — ABNORMAL LOW (ref >59)
Glucose: 309 mg/dL — ABNORMAL HIGH (ref 65–99)
Potassium: 4.3 mmol/L (ref 3.5–5.2)
Sodium: 135 mmol/L — ABNORMAL LOW (ref 136–144)

## 2015-05-19 LAB — T4, FREE: Free T4: 1.35 ng/dL (ref 0.82–1.77)

## 2015-05-19 LAB — TSH: TSH: 1.03 u[IU]/mL (ref 0.450–4.500)

## 2015-05-19 LAB — THYROTROPIN RECEPTOR AUTOABS: Thyrotropin Receptor Ab: <0.51 IU/L (ref 0.00–1.75)

## 2015-05-19 NOTE — Progress Notes (Signed)
Internal Medicine Clinic Attending  I saw and evaluated the patient.  I personally confirmed the key portions of the history and exam documented by Dr. Arcelia Jew and I reviewed pertinent patient test results.  The assessment, diagnosis, and plan were formulated together and I agree with the documentation in the resident's note with the following correction.   In order to evaluate mild exophthalmos we will get a CT of the orbits and check for thyroid receptor antibodies.

## 2015-05-22 ENCOUNTER — Other Ambulatory Visit: Payer: Self-pay | Admitting: Internal Medicine

## 2015-05-22 MED ORDER — COLCHICINE 0.6 MG PO TABS: ORAL_TABLET | ORAL | Status: DC

## 2015-05-22 NOTE — Telephone Encounter (Signed)
Attempt to call patient, unsuccessful, will retry.

## 2015-05-22 NOTE — Telephone Encounter (Signed)
Pt requesting the gout med filled for 90 days supply.

## 2015-05-22 NOTE — Telephone Encounter (Signed)
Based on refill hx, he is taking QD. Pls emphasize that he should take it only when gout flares and therefore, 90 should last a year.

## 2015-05-24 NOTE — Telephone Encounter (Signed)
Spoke with patient, he confirms he is only taking the colchicine when his gout flares up.

## 2015-05-30 ENCOUNTER — Ambulatory Visit (HOSPITAL_COMMUNITY): Admission: RE | Admit: 2015-05-30 | Payer: Medicare Other | Source: Ambulatory Visit

## 2015-06-03 ENCOUNTER — Other Ambulatory Visit: Payer: Self-pay | Admitting: Internal Medicine

## 2015-06-06 NOTE — Telephone Encounter (Signed)
One yr supply sent to [facility] in June. He can transfer refills himself

## 2015-06-15 ENCOUNTER — Encounter: Payer: Self-pay | Admitting: Internal Medicine

## 2015-06-15 ENCOUNTER — Ambulatory Visit (INDEPENDENT_AMBULATORY_CARE_PROVIDER_SITE_OTHER): Payer: Medicare Other | Admitting: Internal Medicine

## 2015-06-15 VITALS — BP 130/46 | HR 72 | Temp 98.1°F | Wt 213.9 lb

## 2015-06-15 DIAGNOSIS — Z7984 Long term (current) use of oral hypoglycemic drugs: Secondary | ICD-10-CM | POA: Diagnosis not present

## 2015-06-15 DIAGNOSIS — E1151 Type 2 diabetes mellitus with diabetic peripheral angiopathy without gangrene: Secondary | ICD-10-CM | POA: Diagnosis not present

## 2015-06-15 DIAGNOSIS — Z794 Long term (current) use of insulin: Secondary | ICD-10-CM | POA: Diagnosis not present

## 2015-06-15 DIAGNOSIS — Z79899 Other long term (current) drug therapy: Secondary | ICD-10-CM

## 2015-06-15 DIAGNOSIS — R0981 Nasal congestion: Secondary | ICD-10-CM | POA: Diagnosis not present

## 2015-06-15 DIAGNOSIS — I10 Essential (primary) hypertension: Secondary | ICD-10-CM

## 2015-06-15 DIAGNOSIS — I129 Hypertensive chronic kidney disease with stage 1 through stage 4 chronic kidney disease, or unspecified chronic kidney disease: Secondary | ICD-10-CM | POA: Diagnosis not present

## 2015-06-15 DIAGNOSIS — Z7982 Long term (current) use of aspirin: Secondary | ICD-10-CM

## 2015-06-15 DIAGNOSIS — N183 Chronic kidney disease, stage 3 (moderate): Secondary | ICD-10-CM

## 2015-06-15 DIAGNOSIS — E1122 Type 2 diabetes mellitus with diabetic chronic kidney disease: Secondary | ICD-10-CM | POA: Diagnosis not present

## 2015-06-15 DIAGNOSIS — I70209 Unspecified atherosclerosis of native arteries of extremities, unspecified extremity: Secondary | ICD-10-CM

## 2015-06-15 DIAGNOSIS — J309 Allergic rhinitis, unspecified: Secondary | ICD-10-CM | POA: Insufficient documentation

## 2015-06-15 LAB — GLUCOSE, CAPILLARY: Glucose-Capillary: 199 mg/dL — ABNORMAL HIGH (ref 65–99)

## 2015-06-15 MED ORDER — ASPIRIN 81 MG PO CHEW: 81.0000 mg | CHEWABLE_TABLET | Freq: Every day | ORAL | Status: DC

## 2015-06-15 MED ORDER — FLUTICASONE PROPIONATE 50 MCG/ACT NA SUSP: 1.0000 | Freq: Every day | NASAL | Status: DC

## 2015-06-15 MED ORDER — PRAVASTATIN SODIUM 40 MG PO TABS: 40.0000 mg | ORAL_TABLET | Freq: Every evening | ORAL | Status: DC

## 2015-06-15 NOTE — Progress Notes (Signed)
   Subjective:    Patient ID: Dylan [name], male    DOB: 12/06/1945, 69 y.o.   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 Please see assessment & plan for status of chronic medical problems.  Review of Systems  Constitutional: Negative for diaphoresis.  HENT: Positive for congestion and rhinorrhea. Negative for sore throat.   Gastrointestinal: Negative for nausea, vomiting and abdominal pain.  Musculoskeletal: Positive for myalgias. Negative for gait problem.  Neurological: Negative for dizziness and syncope.       Objective:   Physical Exam  Constitutional: He is oriented to person, place, and time. He appears well-developed and well-nourished. No distress.  Obese, elderly African-American male  HENT:  Head: Normocephalic and atraumatic.  Mouth/Throat: Oropharynx is clear and moist.  Eyes: Conjunctivae and EOM are normal. Pupils are equal, round, and reactive to light.  Cardiovascular: Normal rate and regular rhythm.  Exam reveals no gallop and no friction rub.   No murmur heard. Pulmonary/Chest: Effort normal and breath sounds normal.  Abdominal: Bowel sounds are normal.  No bruits noted.  Musculoskeletal: Normal range of motion. He exhibits no edema or tenderness.  Neurological: He is alert and oriented to person, place, and time.  Skin: Skin is warm and dry. He is not diaphoretic.  Psychiatric: He has a normal mood and affect. His behavior is normal.          Assessment & Plan:

## 2015-06-15 NOTE — Assessment & Plan Note (Signed)
Overview In reviewing his medications, I noticed he had a bottle of Afrin. He reports to me that he followed up with an ENT provider who prescribed this medication since he would not be eligible for surgical management of his sinus disease. He reports congestion primarily at night and runny nose though denies sore throat, nasal itching. He has never tried [facility] though reports problems with allergies.  Assessment Nasal congestion likely secondary to seasonal allergies. At risk for rebound congestion given his ongoing use of Afrin spray.  Plan -Advised him to stop taking Afrin -Prescribed Flonase 1-2 sprays per nostril daily

## 2015-06-15 NOTE — Assessment & Plan Note (Addendum)
Overview Blood pressure today is 130/46. He has stopped taking clonidine as indicated to him at his prior visit. In reviewing his medications, I noticed that his torsemide and run out. He reported to me that he has not refilled this medication 4 days out of interest for cost.  Assessment Essential hypertension, well controlled on medical therapy.  Plan -Continue current regimen: Labetalol 400 mg twice daily, hydralazine 50 mg 3 times daily, eplerenone 50 mg daily, torsemide 10 mg twice daily -Consider ACE inhibitor/ARB therapy if not contraindicated

## 2015-06-15 NOTE — Patient Instructions (Signed)
Thank you for bringing your medicines today. This helps [place] keep you safe from mistakes.   For your legs, please take aspirin 81 mg and pravastatin 40 mg. If she develop any hives, wheezing, swelling, please call 911 immediately. You can also do any exercises so long as it is for at least 30 minutes at least 3-4 times a week. We will refer you to vascular surgery so that they can take a look at your legs as well.  For your congestion, try Flonase nasal spray. Remember to tip your head forward and to point the bottle to the sides of her head so that you don't have any of the spray runs into the back of her throat.  For your diabetes, please come up the good work.  We will see you back next month.   Progress Toward Treatment Goals:  No flowsheet data found.  Self Care Goals & Plans:  Self Care Goal 12/15/2014  Manage my medications take my medicines as prescribed; refill my medications on time; bring my medications to every visit  Monitor my health keep track of my blood pressure; bring my blood pressure log to each visit  Eat healthy foods eat baked foods instead of fried foods; eat foods that are low in salt  Be physically active take a walk every day    No flowsheet data found.   Care Management & Community Referrals:  No flowsheet data found.

## 2015-06-15 NOTE — Assessment & Plan Note (Signed)
Overview Review of his glucometer today is notable for fasting blood sugars trending mostly in the low to mid 100s with an outlier of 216. He denies any episodes of hypoglycemia as indicated by symptoms of nausea, vomiting, abdominal pain. A1c when last checked 2 months ago was 7.9. He reports he has been taking Lantus 20 units at bedtime along with glipizide 10 mg twice daily before meals.  Assessment Fairly controlled type 2 diabetes with end organ complications.  Plan -Advised patient to pursue the exercise regimen he was mentioning to me as documented under peripheral vascular disease -Continue glipizide 10 mg twice daily and Lantus 20 units at bedtime -Follow-up in 4 weeks

## 2015-06-15 NOTE — Assessment & Plan Note (Addendum)
Overview In the interval since his last visit, he had ABIs on 05/18/15 which were notable for 0.56 on the right and 0.7 on the left, consistent with moderate borderline severe arterial insufficiency.   He reports to me today that he was told he had "poor circulation." He is not on aspirin 53m because he reports that his foot was peeling off as a result of it. He denied any hives, wheezing, respiratory distress while on this medication. He has never been on cholesterol medication either. He feels he can tolerate walking fairly long distances before he starts feeling the cramping pain though is unable to describe further. He is interested in water aerobics as a means to stay active.  Assessment Moderate to severe peripheral vascular disease of bilateral lower extremities. His ability to tolerate physical activity is reassuring though we will need to make sure we aggressively treat risk factors.  Plan -Advised him to resume aspirin 81 mg and to call 911 immediately if he were to develop hives, respiratory distress, wheezing. -Initiated pravastatin 40 mg as a medium intensity statin with plan to follow-up in 4 weeks to reassess how he is doing on this medication -Advised him to pursue any exercise regimen so long as it is at least 30-45 minutes 3-4 times a week.

## 2015-06-16 NOTE — Progress Notes (Signed)
Internal Medicine Clinic Attending  Case discussed with Dr. Patel,Rushil at the time of the visit.  We reviewed the resident's history and exam and pertinent patient test results.  I agree with the assessment, diagnosis, and plan of care documented in the resident's note.  

## 2015-06-30 ENCOUNTER — Other Ambulatory Visit: Payer: Self-pay

## 2015-07-05 ENCOUNTER — Other Ambulatory Visit: Payer: Self-pay

## 2015-07-05 DIAGNOSIS — I739 Peripheral vascular disease, unspecified: Secondary | ICD-10-CM

## 2015-07-17 ENCOUNTER — Ambulatory Visit: Payer: Medicare Other | Admitting: Pulmonary Disease

## 2015-07-18 ENCOUNTER — Ambulatory Visit (INDEPENDENT_AMBULATORY_CARE_PROVIDER_SITE_OTHER): Payer: Medicare Other | Admitting: Pulmonary Disease

## 2015-07-18 ENCOUNTER — Encounter: Payer: Self-pay | Admitting: Pulmonary Disease

## 2015-07-18 VITALS — BP 130/55 | HR 71 | Temp 98.3°F | Ht 66.0 in | Wt 215.0 lb

## 2015-07-18 DIAGNOSIS — J309 Allergic rhinitis, unspecified: Secondary | ICD-10-CM | POA: Diagnosis not present

## 2015-07-18 DIAGNOSIS — H0520 Unspecified exophthalmos: Secondary | ICD-10-CM | POA: Diagnosis not present

## 2015-07-18 DIAGNOSIS — N183 Chronic kidney disease, stage 3 unspecified: Secondary | ICD-10-CM

## 2015-07-18 DIAGNOSIS — E1122 Type 2 diabetes mellitus with diabetic chronic kidney disease: Secondary | ICD-10-CM

## 2015-07-18 DIAGNOSIS — I5032 Chronic diastolic (congestive) heart failure: Secondary | ICD-10-CM

## 2015-07-18 DIAGNOSIS — E1151 Type 2 diabetes mellitus with diabetic peripheral angiopathy without gangrene: Secondary | ICD-10-CM | POA: Diagnosis not present

## 2015-07-18 DIAGNOSIS — Z7982 Long term (current) use of aspirin: Secondary | ICD-10-CM | POA: Diagnosis not present

## 2015-07-18 DIAGNOSIS — I130 Hypertensive heart and chronic kidney disease with heart failure and stage 1 through stage 4 chronic kidney disease, or unspecified chronic kidney disease: Secondary | ICD-10-CM | POA: Diagnosis not present

## 2015-07-18 DIAGNOSIS — I70209 Unspecified atherosclerosis of native arteries of extremities, unspecified extremity: Secondary | ICD-10-CM

## 2015-07-18 DIAGNOSIS — I10 Essential (primary) hypertension: Secondary | ICD-10-CM

## 2015-07-18 DIAGNOSIS — Z794 Long term (current) use of insulin: Secondary | ICD-10-CM

## 2015-07-18 DIAGNOSIS — Z7984 Long term (current) use of oral hypoglycemic drugs: Secondary | ICD-10-CM | POA: Diagnosis not present

## 2015-07-18 LAB — GLUCOSE, CAPILLARY: Glucose-Capillary: 191 mg/dL — ABNORMAL HIGH (ref 65–99)

## 2015-07-18 LAB — POCT GLYCOSYLATED HEMOGLOBIN (HGB A1C): Hemoglobin A1C: 6.6

## 2015-07-18 MED ORDER — HYDRALAZINE HCL 50 MG PO TABS: 50.0000 mg | ORAL_TABLET | Freq: Three times a day (TID) | ORAL | Status: DC

## 2015-07-18 NOTE — Assessment & Plan Note (Signed)
Lab Results  Component Value Date   HGBA1C 6.6 07/18/2015   HGBA1C 7.9 04/20/2015   HGBA1C 6.2 01/12/2015     Assessment: Diabetes control: good control (HgbA1C at goal) Progress toward A1C goal:  at goal Comments: Denies symptomatic hypoglycemia.  Plan: Medications:  Continue glipizide 66m BID and Lantus 20u QHS. Self management tools provided: copy of home glucose meter download Other plans: -Follow up in 3 months

## 2015-07-18 NOTE — Patient Instructions (Signed)
Please follow up with your eye doctor.

## 2015-07-18 NOTE — Assessment & Plan Note (Addendum)
BP Readings from Last 3 Encounters:  07/18/15 130/55  06/15/15 130/46  05/18/15 137/62    Lab Results  Component Value Date   NA 135* 05/18/2015   K 4.3 05/18/2015   CREATININE 1.98* 05/18/2015    Assessment: Blood pressure control: controlled Progress toward BP goal:  at goal  Plan: Medications:  continue current medications including labetalol 400 mg twice daily, hydralazine 50 mg 3 times daily, eplerenone 50 mg daily, torsemide 10 mg twice daily

## 2015-07-18 NOTE — Assessment & Plan Note (Signed)
Assessment: Tolerating Flonase. Reports some conjunctivitis - yellow-white crusting on eyelashes upon waking up in morning on left eye. Likely allergic conjunctivitis.  Plan: -Continue Flonase. -Continue OTC ketotifen opthalmic solution.

## 2015-07-18 NOTE — Assessment & Plan Note (Signed)
Assessment: He reports he is still having this problem. He is able to resolve it with manual pressure to his eye.  Plan: -Encouraged patient to follow up with his ophthalmologist.

## 2015-07-18 NOTE — Progress Notes (Signed)
Subjective:    Patient ID: Dylan [name], male    DOB: 04/22/1946, 70 y.o.   MRN: [id number]  HPI Dylan [name] is a 70 year old man with history of hypertension, DM2, pulmonary HTN, OSA on CPAP, CKD stage 3, chronic diastolic CHF, chronic anemia presenting for follow-up of PVD.  He was last seen in clinic on 06/15/2015. He was told to resume aspirin 81 mg daily for PVD. He was started on pravastatin 40 mg daily. His prescribed Flonase for allergic rhinitis.  He is tolerating his new medications without any problems.  Review of Systems Constitutional: no fevers/chills Eyes: no vision changes Ears, nose, mouth, throat, and face: no cough Respiratory: +chronic shortness of breath Cardiovascular: no chest pain Gastrointestinal: no nausea/vomiting, no abdominal pain, no constipation, no diarrhea Genitourinary: no dysuria, no hematuria Integument: no rash Hematologic/lymphatic: no bleeding/bruising, no edema  Past Medical History  Diagnosis Date  . Essential hypertension 10/09/2014    Poor control with 5 drug therapy.   . DM (diabetes mellitus), type 2 with renal complications ([place]) 4/0/9811  . Pulmonary HTN ([place]) 10/18/2014    Noted as severe ECHO 2016. Likely 2/2 severe OSA.  . OSA on CPAP 10/08/2014  . Chronic renal failure, stage 3 (moderate) 10/18/2014    Baseline Cr about 1.5. Stable from 2010 to 2016.   Marland Kitchen Chronic diastolic heart failure ([place]) 10/18/2014    Noted ECHO 10/2014. Grade 2. EF 50-55%   . Anemia 10/18/2014    Baseline about 12 and stable from 2010 to 2016. Colon 2009 in [place] (records cannot be obtained).  EGD Dr Benson [place] 2011 nl   . Former tobacco use 10/08/2014  . Diverticulosis 10/08/2014    Seen on CT. Reportedly on Colon in [place] in 2009. Freq bouts of diverticulitis.    Current Outpatient Prescriptions on File Prior to Visit  Medication Sig Dispense Refill  . albuterol (PROVENTIL HFA;VENTOLIN HFA) 108 (90 BASE) MCG/ACT inhaler Inhale 1-2 puffs into the lungs every 6 (six) hours as  needed for wheezing or shortness of breath. (Patient not taking: Reported on 06/15/2015) 3 Inhaler 0  . alum & mag hydroxide-simeth (MAALOX/MYLANTA) 200-200-20 MG/5ML suspension Take 30 mLs by mouth every 6 (six) hours as needed for indigestion or heartburn (dyspepsia). 355 mL 0  . aspirin 81 MG chewable tablet Chew 1 tablet (81 mg total) by mouth daily. 90 tablet 3  . colchicine 0.6 MG tablet Take 2 tablets for initial flare and then 1 tablet an hour later. 90 tablet 0  . docusate sodium (COLACE) 100 MG capsule Take 1 capsule (100 mg total) by mouth 2 (two) times daily. (Patient not taking: Reported on 01/05/2015) 10 capsule 0  . eplerenone (INSPRA) 50 MG tablet Take 1 tablet (50 mg total) by mouth daily. 90 tablet 3  . fluticasone (FLONASE) 50 MCG/ACT nasal spray Place 1-2 sprays into both nostrils daily. 16 g 2  . glipiZIDE (GLUCOTROL) 5 MG tablet Take 2 tablets (10 mg total) by mouth 2 (two) times daily before a meal. 360 tablet 3  . hydrALAZINE (APRESOLINE) 50 MG tablet Take 1 tablet (50 mg total) by mouth every 8 (eight) hours. 270 tablet 3  . Insulin Detemir (LEVEMIR) 100 UNIT/ML Pen Inject 20 Units into the skin daily at 10 pm. 30 mL 3  . labetalol (NORMODYNE) 200 MG tablet Take 2 tablets (400 mg total) by mouth 2 (two) times daily. 360 tablet 3  . minoxidil (LONITEN) 10 MG tablet Take 2 tablets (20 mg total) by  mouth daily. 180 tablet 3  . pravastatin (PRAVACHOL) 40 MG tablet Take 1 tablet (40 mg total) by mouth every evening. 30 tablet 1  . Tetrahydrozoline HCl (EYE DROPS OP) Apply 1 drop to eye 2 (two) times daily.    Marland Kitchen torsemide (DEMADEX) 10 MG tablet Take 1 tablet (10 mg total) by mouth 2 (two) times daily. (Patient not taking: Reported on 06/15/2015) 20 tablet 0  . verapamil (VERELAN PM) 360 MG 24 hr capsule Take 1 capsule (360 mg total) by mouth daily. 90 capsule 3   No current facility-administered medications on file prior to visit.    Today's Vitals   07/18/15 1022  BP: 130/55    Pulse: 71  Temp: 98.3 F (36.8 C)  TempSrc: Oral  Height: _0  (1.676 m)  Weight: 215 lb (97.523 kg)  SpO2: 95%   Objective:   Physical Exam  Constitutional: He appears well-developed. No distress.  HENT:  Head: Normocephalic and atraumatic.  Mouth/Throat: Oropharynx is clear and moist.  Eyes: Conjunctivae are normal. Right eye exhibits no discharge. Left eye exhibits no discharge. No scleral icterus.  Cardiovascular: Normal rate and regular rhythm.   Pulmonary/Chest: Effort normal and breath sounds normal. He has no wheezes. He has no rales.  Abdominal: Soft.  Musculoskeletal: Normal range of motion. He exhibits no edema or tenderness.  Neurological: He is alert. Coordination normal.  Skin: Skin is warm and dry. He is not diaphoretic.  Psychiatric: He has a normal mood and affect.   Assessment & Plan:  Please refer to problem based charting.

## 2015-07-18 NOTE — Assessment & Plan Note (Signed)
Assessment: Tolerating ASA 57m daily and pravastatin 456mdaily. Claudication symptoms stable.  Plan: -Continue ASA8173maily, pravastatin 10m77mily. -Would consider putting patient on high intensity statin as secondary prevention in the setting of PVD due to atherosclerosis. -He has an appointment next month for evaluation by vascular surgery.

## 2015-07-20 NOTE — Progress Notes (Signed)
Internal Medicine Clinic Attending  Case discussed with Dr. Krall at the time of the visit.  We reviewed the resident's history and exam and pertinent patient test results.  I agree with the assessment, diagnosis, and plan of care documented in the resident's note.  

## 2015-08-01 ENCOUNTER — Encounter: Payer: Self-pay | Admitting: Vascular Surgery

## 2015-08-05 ENCOUNTER — Encounter (HOSPITAL_COMMUNITY): Payer: Self-pay

## 2015-08-05 ENCOUNTER — Emergency Department (HOSPITAL_COMMUNITY)
Admission: EM | Admit: 2015-08-05 | Discharge: 2015-08-05 | Disposition: A | Discharge status: Home / Self Care | Payer: Medicare Other | Attending: Emergency Medicine | Admitting: Emergency Medicine

## 2015-08-05 DIAGNOSIS — G4733 Obstructive sleep apnea (adult) (pediatric): Secondary | ICD-10-CM | POA: Diagnosis not present

## 2015-08-05 DIAGNOSIS — R1032 Left lower quadrant pain: Secondary | ICD-10-CM | POA: Diagnosis not present

## 2015-08-05 DIAGNOSIS — I5032 Chronic diastolic (congestive) heart failure: Secondary | ICD-10-CM | POA: Insufficient documentation

## 2015-08-05 DIAGNOSIS — E119 Type 2 diabetes mellitus without complications: Secondary | ICD-10-CM | POA: Insufficient documentation

## 2015-08-05 DIAGNOSIS — Z87891 Personal history of nicotine dependence: Secondary | ICD-10-CM | POA: Insufficient documentation

## 2015-08-05 DIAGNOSIS — Z79899 Other long term (current) drug therapy: Secondary | ICD-10-CM | POA: Diagnosis not present

## 2015-08-05 DIAGNOSIS — N183 Chronic kidney disease, stage 3 (moderate): Secondary | ICD-10-CM | POA: Diagnosis not present

## 2015-08-05 DIAGNOSIS — K5792 Diverticulitis of intestine, part unspecified, without perforation or abscess without bleeding: Secondary | ICD-10-CM | POA: Diagnosis not present

## 2015-08-05 DIAGNOSIS — I129 Hypertensive chronic kidney disease with stage 1 through stage 4 chronic kidney disease, or unspecified chronic kidney disease: Secondary | ICD-10-CM | POA: Insufficient documentation

## 2015-08-05 DIAGNOSIS — Z794 Long term (current) use of insulin: Secondary | ICD-10-CM | POA: Insufficient documentation

## 2015-08-05 DIAGNOSIS — K5790 Diverticulosis of intestine, part unspecified, without perforation or abscess without bleeding: Secondary | ICD-10-CM | POA: Insufficient documentation

## 2015-08-05 DIAGNOSIS — Z7951 Long term (current) use of inhaled steroids: Secondary | ICD-10-CM | POA: Insufficient documentation

## 2015-08-05 DIAGNOSIS — Z862 Personal history of diseases of the blood and blood-forming organs and certain disorders involving the immune mechanism: Secondary | ICD-10-CM | POA: Diagnosis not present

## 2015-08-05 DIAGNOSIS — Z7982 Long term (current) use of aspirin: Secondary | ICD-10-CM | POA: Insufficient documentation

## 2015-08-05 LAB — COMPREHENSIVE METABOLIC PANEL
ALT: 15 U/L — ABNORMAL LOW (ref 17–63)
AST: 17 U/L (ref 15–41)
Albumin: 3.9 g/dL (ref 3.5–5.0)
Alkaline Phosphatase: 56 U/L (ref 38–126)
Anion gap: 13 (ref 5–15)
BUN: 32 mg/dL — ABNORMAL HIGH (ref 6–20)
CO2: 23 mmol/L (ref 22–32)
Calcium: 9.2 mg/dL (ref 8.9–10.3)
Chloride: 106 mmol/L (ref 101–111)
Creatinine, Ser: 2.12 mg/dL — ABNORMAL HIGH (ref 0.61–1.24)
GFR calc Af Amer: 34 mL/min — ABNORMAL LOW (ref >60)
GFR calc non Af Amer: 30 mL/min — ABNORMAL LOW (ref >60)
Glucose, Bld: 234 mg/dL — ABNORMAL HIGH (ref 65–99)
Potassium: 3.8 mmol/L (ref 3.5–5.1)
Sodium: 142 mmol/L (ref 135–145)
Total Bilirubin: 0.3 mg/dL (ref 0.3–1.2)
Total Protein: 7.7 g/dL (ref 6.5–8.1)

## 2015-08-05 LAB — URINALYSIS, ROUTINE W REFLEX MICROSCOPIC
Bilirubin Urine: NEGATIVE
Glucose, UA: NEGATIVE mg/dL
Hgb urine dipstick: NEGATIVE
Ketones, ur: NEGATIVE mg/dL
Leukocytes, UA: NEGATIVE
Nitrite: NEGATIVE
Protein, ur: NEGATIVE mg/dL
Specific Gravity, Urine: 1.017 (ref 1.005–1.030)
pH: 5 (ref 5.0–8.0)

## 2015-08-05 LAB — CBC
HCT: 38.3 % — ABNORMAL LOW (ref 39.0–52.0)
Hemoglobin: 12.7 g/dL — ABNORMAL LOW (ref 13.0–17.0)
MCH: 31 pg (ref 26.0–34.0)
MCHC: 33.2 g/dL (ref 30.0–36.0)
MCV: 93.4 fL (ref 78.0–100.0)
Platelets: 139 10*3/uL — ABNORMAL LOW (ref 150–400)
RBC: 4.1 MIL/uL — ABNORMAL LOW (ref 4.22–5.81)
RDW: 12.8 % (ref 11.5–15.5)
WBC: 8.7 10*3/uL (ref 4.0–10.5)

## 2015-08-05 LAB — LIPASE, BLOOD: Lipase: 37 U/L (ref 11–51)

## 2015-08-05 MED ORDER — METRONIDAZOLE 500 MG PO TABS: 500.0000 mg | ORAL_TABLET | Freq: Two times a day (BID) | ORAL | Status: DC

## 2015-08-05 MED ORDER — DOCUSATE SODIUM 100 MG PO CAPS: 100.0000 mg | ORAL_CAPSULE | Freq: Two times a day (BID) | ORAL | Status: DC

## 2015-08-05 MED ORDER — HYDROCODONE-ACETAMINOPHEN 5-325 MG PO TABS: 1.0000 | ORAL_TABLET | ORAL | Status: DC | PRN

## 2015-08-05 MED ORDER — CIPROFLOXACIN HCL 500 MG PO TABS: 500.0000 mg | ORAL_TABLET | Freq: Two times a day (BID) | ORAL | Status: DC

## 2015-08-05 NOTE — ED Notes (Signed)
Pt verbalized understanding of d/c instructions, prescriptions, and follow-up care. No further questions/concerns, VSS, ambulatory w/ steady gait (refused wheelchair)

## 2015-08-05 NOTE — Discharge Instructions (Signed)
Diverticulitis Diverticulitis is inflammation or infection of small pouches in your colon that form when you have a condition called diverticulosis. The pouches in your colon are called diverticula. Your colon, or large intestine, is where water is absorbed and stool is formed. Complications of diverticulitis can include:  Bleeding.  Severe infection.  Severe pain.  Perforation of your colon.  Obstruction of your colon. CAUSES  Diverticulitis is caused by bacteria. Diverticulitis happens when stool becomes trapped in diverticula. This allows bacteria to grow in the diverticula, which can lead to inflammation and infection. RISK FACTORS People with diverticulosis are at risk for diverticulitis. Eating a diet that does not include enough fiber from fruits and vegetables may make diverticulitis more likely to develop. SYMPTOMS  Symptoms of diverticulitis may include:  Abdominal pain and tenderness. The pain is normally located on the left side of the abdomen, but may occur in other areas.  Fever and chills.  Bloating.  Cramping.  Nausea.  Vomiting.  Constipation.  Diarrhea.  Blood in your stool. DIAGNOSIS  Your health care provider will ask you about your medical history and do a physical exam. You may need to have tests done because many medical conditions can cause the same symptoms as diverticulitis. Tests may include:  Blood tests.  Urine tests.  Imaging tests of the abdomen, including X-rays and CT scans. When your condition is under control, your health care provider may recommend that you have a colonoscopy. A colonoscopy can show how severe your diverticula are and whether something else is causing your symptoms. TREATMENT  Most cases of diverticulitis are mild and can be treated at home. Treatment may include:  Taking over-the-counter pain medicines.  Following a clear liquid diet.  Taking antibiotic medicines by mouth for 7-10 days. More severe cases may  be treated at a hospital. Treatment may include:  Not eating or drinking.  Taking prescription pain medicine.  Receiving antibiotic medicines through an IV tube.  Receiving fluids and nutrition through an IV tube.  Surgery. HOME CARE INSTRUCTIONS   Follow your health care provider's instructions carefully.  Follow a full liquid diet or other diet as directed by your health care provider. After your symptoms improve, your health care provider may tell you to change your diet. He or she may recommend you eat a high-fiber diet. Fruits and vegetables are good sources of fiber. Fiber makes it easier to pass stool.  Take fiber supplements or probiotics as directed by your health care provider.  Only take medicines as directed by your health care provider.  Keep all your follow-up appointments. SEEK MEDICAL CARE IF:   Your pain does not improve.  You have a hard time eating food.  Your bowel movements do not return to normal. SEEK IMMEDIATE MEDICAL CARE IF:   Your pain becomes worse.  Your symptoms do not get better.  Your symptoms suddenly get worse.  You have a fever.  You have repeated vomiting.  You have bloody or black, tarry stools. MAKE SURE YOU:   Understand these instructions.  Will watch your condition.  Will get help right away if you are not doing well or get worse.   This information is not intended to replace advice given to you by your health care provider. Make sure you discuss any questions you have with your health care provider.   Document Released: 04/03/2005 Document Revised: 06/29/2013 Document Reviewed: 05/19/2013 Elsevier Interactive Patient Education [employer].

## 2015-08-05 NOTE — ED Provider Notes (Signed)
CSN: [id number]     Arrival date & time 08/05/15  0730 History   First MD Initiated Contact with Patient 08/05/15 [phone]     Chief Complaint  Patient presents with  . Abdominal Pain     (Consider location/radiation/quality/duration/timing/severity/associated sxs/prior Treatment) HPI Patient reports a week and a half of left lower abdominal pain. He reports it's like a pinch and an ache. He reports he has vomited once over the course of the past week. He reports normal bowel movements with no blood. No pain burning or urgency with urination. Patient reports this is the same pain he has had in the past with diverticulitis. He reports he's had several episodes but never required surgery. No fever. No chest pain. patient reports he chronically has some degree of shortness of breath which is no different for him. He reports last time he was treated for diverticulitis has been over a year ago. Patient reports he had a cold and nasal congestion and coughing which lasted almost 2 weeks. He reports he took extra vitamin C and TheraFlu and is now nearly resolved. Past Medical History  Diagnosis Date  . Essential hypertension 10/09/2014    Poor control with 5 drug therapy.   . DM (diabetes mellitus), type 2 with renal complications ([place]) 02/13/3733  . Pulmonary HTN ([place]) 10/18/2014    Noted as severe ECHO 2016. Likely 2/2 severe OSA.  . OSA on CPAP 10/08/2014  . Chronic renal failure, stage 3 (moderate) 10/18/2014    Baseline Cr about 1.5. Stable from 2010 to 2016.   Marland Kitchen Chronic diastolic heart failure ([place]) 10/18/2014    Noted ECHO 10/2014. Grade 2. EF 50-55%   . Anemia 10/18/2014    Baseline about 12 and stable from 2010 to 2016. Colon 2009 in [place] (records cannot be obtained).  EGD Dr Benson [place] 2011 nl   . Former tobacco use 10/08/2014  . Diverticulosis 10/08/2014    Seen on CT. Reportedly on Colon in [place] in 2009. Freq bouts of diverticulitis.   Past Surgical History  Procedure Laterality Date  . Cholecystectomy      No family history on file. Social History  Substance Use Topics  . Smoking status: Former Smoker    Quit date: 10/02/1972  . Smokeless tobacco: Never Used  . Alcohol Use: No    Review of Systems  10 Systems reviewed and are negative for acute change except as noted in the HPI.   Allergies  Spironolactone  Home Medications   Prior to Admission medications   Medication Sig Start Date End Date Taking? Authorizing Provider  albuterol (PROVENTIL HFA;VENTOLIN HFA) 108 (90 BASE) MCG/ACT inhaler Inhale 1-2 puffs into the lungs every 6 (six) hours as needed for wheezing or shortness of breath. 12/08/14  Yes Bartholomew Crews, MD  alum & mag hydroxide-simeth (MAALOX/MYLANTA) 200-200-20 MG/5ML suspension Take 30 mLs by mouth every 6 (six) hours as needed for indigestion or heartburn (dyspepsia). 10/13/14  Yes Maryann Mikhail, DO  aspirin 81 MG chewable tablet Chew 1 tablet (81 mg total) by mouth daily. 06/15/15  Yes Rushil Sherrye Payor, MD  colchicine 0.6 MG tablet Take 2 tablets for initial flare and then 1 tablet an hour later. 05/22/15 10/12/15 Yes Bartholomew Crews, MD  docusate sodium (COLACE) 100 MG capsule Take 1 capsule (100 mg total) by mouth 2 (two) times daily. 10/13/14  Yes Maryann Mikhail, DO  eplerenone (INSPRA) 50 MG tablet Take 1 tablet (50 mg total) by mouth daily. 12/08/14  Yes Real Cons  Lynnae January, MD  fluticasone (FLONASE) 50 MCG/ACT nasal spray Place 1-2 sprays into both nostrils daily. 06/15/15  Yes Rushil Sherrye Payor, MD  glipiZIDE (GLUCOTROL) 5 MG tablet Take 2 tablets (10 mg total) by mouth 2 (two) times daily before a meal. 04/20/15  Yes Bartholomew Crews, MD  hydrALAZINE (APRESOLINE) 50 MG tablet Take 1 tablet (50 mg total) by mouth every 8 (eight) hours. 07/18/15  Yes Milagros Loll, MD  Insulin Detemir (LEVEMIR) 100 UNIT/ML Pen Inject 20 Units into the skin daily at 10 pm. 12/08/14  Yes Bartholomew Crews, MD  ketotifen (ZADITOR) 0.025 % ophthalmic solution Place 1 drop into both  eyes 2 (two) times daily.   Yes Historical Provider, MD  labetalol (NORMODYNE) 200 MG tablet Take 2 tablets (400 mg total) by mouth 2 (two) times daily. 12/08/14  Yes Bartholomew Crews, MD  minoxidil (LONITEN) 10 MG tablet Take 2 tablets (20 mg total) by mouth daily. 12/08/14  Yes Bartholomew Crews, MD  pravastatin (PRAVACHOL) 40 MG tablet Take 1 tablet (40 mg total) by mouth every evening. 06/15/15 06/14/16 Yes Rushil Sherrye Payor, MD  torsemide (DEMADEX) 10 MG tablet Take 1 tablet (10 mg total) by mouth 2 (two) times daily. 01/05/15  Yes Davonna Belling, MD  verapamil (VERELAN PM) 360 MG 24 hr capsule Take 1 capsule (360 mg total) by mouth daily. 12/08/14  Yes Bartholomew Crews, MD  ciprofloxacin (CIPRO) 500 MG tablet Take 1 tablet (500 mg total) by mouth 2 (two) times daily. One po bid x 7 days 08/05/15   Charlesetta Shanks, MD  docusate sodium (COLACE) 100 MG capsule Take 1 capsule (100 mg total) by mouth every 12 (twelve) hours. 08/05/15   Charlesetta Shanks, MD  HYDROcodone-acetaminophen (NORCO/VICODIN) 5-325 MG tablet Take 1-2 tablets by mouth every 4 (four) hours as needed for moderate pain or severe pain. 08/05/15   Charlesetta Shanks, MD  metroNIDAZOLE (FLAGYL) 500 MG tablet Take 1 tablet (500 mg total) by mouth 2 (two) times daily. One po bid x 7 days 08/05/15   Charlesetta Shanks, MD   BP 151/61 mmHg  Pulse 63  Temp(Src) 98.4 F (36.9 C) (Oral)  Resp 15  Wt 209 lb 14.4 oz (95.21 kg)  SpO2 95% Physical Exam  Constitutional: He is oriented to person, place, and time. He appears well-developed and well-nourished.  HENT:  Head: Normocephalic and atraumatic.  Eyes: EOM are normal. Pupils are equal, round, and reactive to light.  Neck: Neck supple.  Cardiovascular: Normal rate, regular rhythm, normal heart sounds and intact distal pulses.   Pulmonary/Chest: Effort normal and breath sounds normal.  Abdominal: Soft. Bowel sounds are normal. He exhibits no distension. There is tenderness.  Mild left lower  quadrant tenderness with no guarding or rebound. Very soft palpation. Palpable masses. No CVA tenderness. Skin is normal no rashes.  Genitourinary:  Groin and inguinal canal are soft with no masses or tenderness. No scrotal swelling or edema and no testicular tenderness.  Musculoskeletal: Normal range of motion. He exhibits no edema or tenderness.  Neurological: He is alert and oriented to person, place, and time. He has normal strength. Coordination normal. GCS eye subscore is 4. GCS verbal subscore is 5. GCS motor subscore is 6.  Skin: Skin is warm, dry and intact.  Psychiatric: He has a normal mood and affect.    ED Course  Procedures (including critical care time) Labs Review Labs Reviewed  COMPREHENSIVE METABOLIC PANEL - Abnormal; Notable for the following:  Glucose, Bld 234 (*)    BUN 32 (*)    Creatinine, Ser 2.12 (*)    ALT 15 (*)    GFR calc non Af Amer 30 (*)    GFR calc Af Amer 34 (*)    All other components within normal limits  CBC - Abnormal; Notable for the following:    RBC 4.10 (*)    Hemoglobin 12.7 (*)    HCT 38.3 (*)    Platelets 139 (*)    All other components within normal limits  LIPASE, BLOOD  URINALYSIS, ROUTINE W REFLEX MICROSCOPIC (NOT AT [facility])    Imaging Review No results found. I have personally reviewed and evaluated these images and lab results as part of my medical decision-making.   EKG Interpretation None      MDM   Final diagnoses:  Diverticulitis of intestine without perforation or abscess without bleeding   Patient resents with left lower quadrant abdominal pain for a weeks duration. Abdominal examination is soft without peritoneal signs. Patient is afebrile with stable vital signs. Patient has history of diverticulitis with similar symptoms. At this time I feel he is safe for outpatient management of diverticulitis with ciprofloxacin and Flagyl. He is counseled on the use of Colace if he tends towards any constipation. He is  counseled to follow-up with family physician on Monday and return to the emergency department if any worsening or changing symptoms.    Charlesetta Shanks, MD 08/05/15 [phone]

## 2015-08-05 NOTE — ED Notes (Signed)
Pt. Presents with complaint of L sided abd pain and distention. Pt. States it feels like a diverticulitis flare up. Pt. Ambulatory to room. States normal BM, denies blood in stool. Denies N/V. Denies CP/SOB.

## 2015-08-07 ENCOUNTER — Other Ambulatory Visit: Payer: Self-pay | Admitting: Internal Medicine

## 2015-08-07 MED ORDER — TORSEMIDE 10 MG PO TABS: 10.0000 mg | ORAL_TABLET | Freq: Two times a day (BID) | ORAL | Status: DC

## 2015-08-07 NOTE — Telephone Encounter (Signed)
Verified BID with pt. Cr stable and K OK

## 2015-08-07 NOTE — Telephone Encounter (Signed)
Pt requesting torsemide to be filled @ [facility] on [address].

## 2015-08-07 NOTE — Telephone Encounter (Signed)
Last refill 6/30 Please check dose

## 2015-08-11 ENCOUNTER — Ambulatory Visit (HOSPITAL_COMMUNITY)
Admission: RE | Admit: 2015-08-11 | Discharge: 2015-08-11 | Disposition: A | Discharge status: Home / Self Care | Payer: Medicare Other | Source: Ambulatory Visit | Attending: Vascular Surgery | Admitting: Vascular Surgery

## 2015-08-11 ENCOUNTER — Encounter: Payer: Self-pay | Admitting: Vascular Surgery

## 2015-08-11 ENCOUNTER — Ambulatory Visit (INDEPENDENT_AMBULATORY_CARE_PROVIDER_SITE_OTHER): Payer: Medicare Other | Admitting: Vascular Surgery

## 2015-08-11 ENCOUNTER — Ambulatory Visit (INDEPENDENT_AMBULATORY_CARE_PROVIDER_SITE_OTHER)
Admission: RE | Admit: 2015-08-11 | Discharge: 2015-08-11 | Disposition: A | Discharge status: Home / Self Care | Payer: Medicare Other | Source: Ambulatory Visit | Attending: Vascular Surgery | Admitting: Vascular Surgery

## 2015-08-11 VITALS — BP 136/57 | HR 66 | Temp 98.3°F | Resp 16 | Ht 66.0 in | Wt 218.0 lb

## 2015-08-11 DIAGNOSIS — I739 Peripheral vascular disease, unspecified: Secondary | ICD-10-CM | POA: Insufficient documentation

## 2015-08-11 DIAGNOSIS — I129 Hypertensive chronic kidney disease with stage 1 through stage 4 chronic kidney disease, or unspecified chronic kidney disease: Secondary | ICD-10-CM | POA: Insufficient documentation

## 2015-08-11 DIAGNOSIS — N183 Chronic kidney disease, stage 3 (moderate): Secondary | ICD-10-CM | POA: Diagnosis not present

## 2015-08-11 DIAGNOSIS — E1122 Type 2 diabetes mellitus with diabetic chronic kidney disease: Secondary | ICD-10-CM | POA: Insufficient documentation

## 2015-08-11 DIAGNOSIS — I70209 Unspecified atherosclerosis of native arteries of extremities, unspecified extremity: Secondary | ICD-10-CM | POA: Diagnosis not present

## 2015-08-11 DIAGNOSIS — I70213 Atherosclerosis of native arteries of extremities with intermittent claudication, bilateral legs: Secondary | ICD-10-CM

## 2015-08-11 DIAGNOSIS — R0989 Other specified symptoms and signs involving the circulatory and respiratory systems: Secondary | ICD-10-CM | POA: Diagnosis present

## 2015-08-11 NOTE — Progress Notes (Addendum)
Referred by:  Bartholomew Crews, MD [address] [place], [place] [address]  Reason for referral: bilateral intermittent claudication   History of Present Illness  Dylan [name] is a 70 y.o. (30-Jan-1946) male who presents with chief complaint: bilateral calf cramping.  Onset of symptom occurred years ago.  Pain is described as cramping, severity 1-5/10, and associated with walking ~70 yards.  Patient has attempted to treat this pain with rest.  He is still able to ambulated about a footfall field length before he has to stop.  The patient has no rest pain symptoms also and no leg wounds/ulcers.  He is able to complete his ADL.  Atherosclerotic risk factors include: HTN, DM, and prior smoker.   Past Medical History  Diagnosis Date  . Essential hypertension 10/09/2014    Poor control with 5 drug therapy.   . DM (diabetes mellitus), type 2 with renal complications ([place]) 11/12/8500  . Pulmonary HTN ([place]) 10/18/2014    Noted as severe ECHO 2016. Likely 2/2 severe OSA.  . OSA on CPAP 10/08/2014  . Chronic renal failure, stage 3 (moderate) 10/18/2014    Baseline Cr about 1.5. Stable from 2010 to 2016.   Marland Kitchen Chronic diastolic heart failure ([place]) 10/18/2014    Noted ECHO 10/2014. Grade 2. EF 50-55%   . Anemia 10/18/2014    Baseline about 12 and stable from 2010 to 2016. Colon 2009 in [place] (records cannot be obtained).  EGD Dr Benson [place] 2011 nl   . Former tobacco use 10/08/2014  . Diverticulosis 10/08/2014    Seen on CT. Reportedly on Colon in [place] in 2009. Freq bouts of diverticulitis.    Past Surgical History  Procedure Laterality Date  . Cholecystectomy      Social History   Social History  . Marital Status: Legally Separated    Spouse Name: N/A  . Number of Children: N/A  . Years of Education: N/A   Occupational History  . Not on file.   Social History Main Topics  . Smoking status: Former Smoker    Quit date: 10/02/1972  . Smokeless tobacco: Never Used  . Alcohol Use: No  . Drug Use: No   . Sexual Activity: No   Other Topics Concern  . 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 Has male sig other. Likes to go fishing.    Family History: not available as adopted.   Current Outpatient Prescriptions  Medication Sig Dispense Refill  . aspirin 81 MG chewable tablet Chew 1 tablet (81 mg total) by mouth daily. 90 tablet 3  . ciprofloxacin (CIPRO) 500 MG tablet Take 1 tablet (500 mg total) by mouth 2 (two) times daily. One po bid x 7 days 14 tablet 0  . docusate sodium (COLACE) 100 MG capsule Take 1 capsule (100 mg total) by mouth 2 (two) times daily. 10 capsule 0  . docusate sodium (COLACE) 100 MG capsule Take 1 capsule (100 mg total) by mouth every 12 (twelve) hours. 60 capsule 0  . eplerenone (INSPRA) 50 MG tablet Take 1 tablet (50 mg total) by mouth daily. 90 tablet 3  . fluticasone (FLONASE) 50 MCG/ACT nasal spray Place 1-2 sprays into both nostrils daily. 16 g 2  . glipiZIDE (GLUCOTROL) 5 MG tablet Take 2 tablets (10 mg total) by mouth 2 (two) times daily before a meal. 360 tablet 3  . hydrALAZINE (APRESOLINE) 50 MG  tablet Take 1 tablet (50 mg total) by mouth every 8 (eight) hours. 270 tablet 3  . HYDROcodone-acetaminophen (NORCO/VICODIN) 5-325 MG tablet Take 1-2 tablets by mouth every 4 (four) hours as needed for moderate pain or severe pain. 20 tablet 0  . Insulin Detemir (LEVEMIR) 100 UNIT/ML Pen Inject 20 Units into the skin daily at 10 pm. 30 mL 3  . ketotifen (ZADITOR) 0.025 % ophthalmic solution Place 1 drop into both eyes 2 (two) times daily.    Marland Kitchen labetalol (NORMODYNE) 200 MG tablet Take 2 tablets (400 mg total) by mouth 2 (two) times daily. 360 tablet 3  . metroNIDAZOLE (FLAGYL) 500 MG tablet Take 1 tablet (500 mg total) by mouth 2 (two) times daily. One po bid x 7 days 14 tablet 0  . minoxidil (LONITEN) 10 MG tablet Take 2 tablets (20 mg total) by mouth daily. 180  tablet 3  . pravastatin (PRAVACHOL) 40 MG tablet Take 1 tablet (40 mg total) by mouth every evening. 30 tablet 1  . torsemide (DEMADEX) 10 MG tablet Take 1 tablet (10 mg total) by mouth 2 (two) times daily. 60 tablet 5  . verapamil (VERELAN PM) 360 MG 24 hr capsule Take 1 capsule (360 mg total) by mouth daily. 90 capsule 3  . albuterol (PROVENTIL HFA;VENTOLIN HFA) 108 (90 BASE) MCG/ACT inhaler Inhale 1-2 puffs into the lungs every 6 (six) hours as needed for wheezing or shortness of breath. (Patient not taking: Reported on 08/11/2015) 3 Inhaler 0  . alum & mag hydroxide-simeth (MAALOX/MYLANTA) 200-200-20 MG/5ML suspension Take 30 mLs by mouth every 6 (six) hours as needed for indigestion or heartburn (dyspepsia). (Patient not taking: Reported on 08/11/2015) 355 mL 0  . colchicine 0.6 MG tablet Take 2 tablets for initial flare and then 1 tablet an hour later. (Patient not taking: Reported on 08/11/2015) 90 tablet 0   No current facility-administered medications for this visit.    Allergies  Allergen Reactions  . Spironolactone Other (See Comments)    Gynecomastia per pt report.     REVIEW OF SYSTEMS:  (Positives checked otherwise negative)  CARDIOVASCULAR:   _0  chest pain,  _1  chest pressure,  _2  palpitations,  _3  shortness of breath when laying flat,  _4  shortness of breath with exertion,   _5  pain in feet when walking,  _6  pain in feet when laying flat, _7  history of blood clot in veins (DVT),  _8  history of phlebitis,  _9  swelling in legs,  _10  varicose veins  PULMONARY:   _11  productive cough,  _12  asthma,  _13  wheezing  NEUROLOGIC:   _14  weakness in arms or legs,  _15  numbness in arms or legs,  _16  difficulty speaking or slurred speech,  _17  temporary loss of vision in one eye,  _18  dizziness  HEMATOLOGIC:   _19  bleeding problems,  _20  problems with blood clotting too easily  MUSCULOSKEL:   _21  joint pain, _22  joint swelling  GASTROINTEST:   _23  vomiting  blood,  _24  blood in stool     GENITOURINARY:   _25  burning with urination,  _26  blood in urine  PSYCHIATRIC:   _27  history of major depression  INTEGUMENTARY:   _28  rashes,  _29  ulcers  CONSTITUTIONAL:   _30  fever,  _31  chills   For VQI Use Only   PRE-ADM LIVING: Home  AMB STATUS: Ambulatory  CAD Sx: None  PRIOR CHF: Mild  STRESS TEST: [x ] No, _0  Normal, _1  + ischemia, _2  + MI, _3  Both   Physical Examination  Filed Vitals:   08/11/15 1118 08/11/15 1128  BP: 143/59 136/57  Pulse: 70 66  Temp: 98.3 F (36.8 C)   TempSrc: Oral   Resp: 16   Height: _4  (1.676 m)   Weight: 218 lb (98.884 kg)   SpO2: 94%    Body mass index is 35.2 kg/(m^2).  General: A&O x 3, WD, obese  Head: [place]/AT  Ear/Nose/Throat: Hearing grossly intact, nares without erythema or drainage, oropharynx without Erythema/Exudate, Mallampati score: 3  Eyes: PERRLA, EOMI  Neck: Supple, no nuchal rigidity, no palpable LAD  Pulmonary: Sym exp, good air movt, CTAB, no rales, rhonchi, & wheezing  Cardiac: RRR, Nl S1, S2, no Murmurs, rubs or gallops  Vascular: Vessel Right Left  Radial Palpable Faintly Palpable  Brachial Palpable Faintly Palpable  Carotid Palpable, without bruit Palpable, without bruit  Aorta Not palpable N/A  Femoral Palpable Palpable  Popliteal Not palpable Not palpable  PT Not Palpable Not Palpable  DP Not Palpable Not Palpable   Gastrointestinal: soft, NTND, no G/R, no HSM, no masses, no CVAT B  Musculoskeletal: M/S 5/5 throughout , Extremities without ischemic changes   Neurologic: CN 2-12 intact , Pain and light touch intact in extremities , Motor exam as listed above  Psychiatric: Judgment intact, Mood & affect appropriate for pt's clinical situation  Dermatologic: See M/S exam for extremity exam, no rashes otherwise noted  Lymph : No Cervical, Axillary, or Inguinal lymphadenopathy    Non-Invasive Vascular Imaging  ABI (Date: 08/11/2015)  R:    ABI: 0.49 (0.56),   DP: mono,   PT: mono,   TBI: 0.28  L:   ABI: 0.72 (0.72),   DP: mono,   PT: mono,   TBI: 0.43  BLE arterial duplex (08/11/2015)  R: CFA, PFA, and proximal SFA Biphasic, rest mono  L: CFA, DFA, SFA biphasic, rest mono   Outside Studies/Documentation 2 pages of outside documents were reviewed including: inpt ABI.  Medical Decision Making  Clint Biello is a 70 y.o. male who presents with: BLE intermittent claudication.   Based on this patient's history and physical exam, I recommend: maximal medical mgmt and walking plan.  I discussed with the patient the natural history of intermittent claudication: 75% of patients have stable or improved symptoms in a year an only 2% require amputation. Eventually 20% may require intervention in a year.  I discussed in depth with the patient the nature of atherosclerosis, and emphasized the importance of maximal medical management including strict control of blood pressure, blood glucose, and lipid levels, antiplatelet agent, obtaining regular exercise, and cessation of smoking.    The patient is aware that without maximal medical management the underlying atherosclerotic disease process will progress, limiting the benefit of any interventions.  I discussed in depth with the patient a walking plan and how to execute such.  The patient is not a candidate for Pletal given his history of CHF The patient is currently on a statin: Pravachol. The patient is currently on an anti-platelet: ASA. Will repeat BLE ABI in 3 months. We discussed the signs and symptoms of CLI.  He will follow up with [place] sooner if he develops any of thee.  Thank you for allowing [place] to participate in this patient's care.   Adele Barthel, MD [facility]  Office: [phone] Pager: [phone]  08/11/2015, 7:02 PM

## 2015-08-17 DIAGNOSIS — E119 Type 2 diabetes mellitus without complications: Secondary | ICD-10-CM | POA: Diagnosis not present

## 2015-08-17 LAB — HM DIABETES EYE EXAM

## 2015-08-22 ENCOUNTER — Other Ambulatory Visit: Payer: Self-pay | Admitting: Internal Medicine

## 2015-08-22 DIAGNOSIS — I10 Essential (primary) hypertension: Secondary | ICD-10-CM

## 2015-08-22 NOTE — Telephone Encounter (Signed)
Both of these are at pharm, he has scripts on hold, spoke w/ pharmacist and she is going to call him and let him know, it is too early to refill torsemide

## 2015-08-22 NOTE — Telephone Encounter (Signed)
Pt states torsemide is not at the pharmacy. Requesting eplerenone to be filled @ [facility] on [address].

## 2015-08-23 ENCOUNTER — Encounter: Payer: Self-pay | Admitting: Internal Medicine

## 2015-08-23 ENCOUNTER — Other Ambulatory Visit: Payer: Self-pay

## 2015-08-23 DIAGNOSIS — I70209 Unspecified atherosclerosis of native arteries of extremities, unspecified extremity: Secondary | ICD-10-CM

## 2015-08-23 MED ORDER — PRAVASTATIN SODIUM 40 MG PO TABS: 40.0000 mg | ORAL_TABLET | Freq: Every evening | ORAL | Status: DC

## 2015-08-24 ENCOUNTER — Encounter: Payer: Self-pay | Admitting: *Deleted

## 2015-09-04 ENCOUNTER — Encounter: Payer: Self-pay | Admitting: Internal Medicine

## 2015-09-04 ENCOUNTER — Ambulatory Visit (INDEPENDENT_AMBULATORY_CARE_PROVIDER_SITE_OTHER): Payer: Medicare Other | Admitting: Internal Medicine

## 2015-09-04 ENCOUNTER — Ambulatory Visit (HOSPITAL_COMMUNITY)
Admission: RE | Admit: 2015-09-04 | Discharge: 2015-09-04 | Disposition: A | Discharge status: Home / Self Care | Payer: Medicare Other | Source: Ambulatory Visit | Attending: Internal Medicine | Admitting: Internal Medicine

## 2015-09-04 VITALS — BP 148/59 | HR 70 | Temp 98.8°F | Wt 209.8 lb

## 2015-09-04 DIAGNOSIS — R05 Cough: Secondary | ICD-10-CM | POA: Insufficient documentation

## 2015-09-04 DIAGNOSIS — W208XXA Other cause of strike by thrown, projected or falling object, initial encounter: Secondary | ICD-10-CM | POA: Diagnosis not present

## 2015-09-04 DIAGNOSIS — R053 Chronic cough: Secondary | ICD-10-CM

## 2015-09-04 DIAGNOSIS — S99922A Unspecified injury of left foot, initial encounter: Secondary | ICD-10-CM

## 2015-09-04 DIAGNOSIS — S91312A Laceration without foreign body, left foot, initial encounter: Secondary | ICD-10-CM

## 2015-09-04 DIAGNOSIS — J309 Allergic rhinitis, unspecified: Secondary | ICD-10-CM

## 2015-09-04 DIAGNOSIS — R0989 Other specified symptoms and signs involving the circulatory and respiratory systems: Secondary | ICD-10-CM | POA: Insufficient documentation

## 2015-09-04 DIAGNOSIS — I517 Cardiomegaly: Secondary | ICD-10-CM | POA: Diagnosis not present

## 2015-09-04 DIAGNOSIS — S99929A Unspecified injury of unspecified foot, initial encounter: Secondary | ICD-10-CM | POA: Insufficient documentation

## 2015-09-04 NOTE — Assessment & Plan Note (Signed)
HPI: Patient report cough lasting for the last 2 months, this is associated with some rhinorrhea.  He notes that he has previously saw ENT who discussed a surgery for the issue but reported he was not a good candidate and recommended Flonase nasal spray.  He has not been using the nasal spray but he thinks that it helps his nasal congestion when he does.  A: Allergic rhinitis causing chronic cough  P:  Obtain CXR - Recommended using flonase daily until next appointment with Dr Lynnae January to see if this resolves his cough.

## 2015-09-04 NOTE — Patient Instructions (Signed)
General Instructions:  Keep a close eye on your foot and let us know if you develop pain, redness or drainage from the foot.  I will get a Chest Xray today, and call with the results.  I want you to work on using the nasal spray twice a day for then next month.  Please bring your medicines with you each time you come to clinic.  Medicines may include prescription medications, over-the-counter medications, herbal remedies, eye drops, vitamins, or other pills.   Progress Toward Treatment Goals:  Treatment Goal 07/18/2015  Hemoglobin A1C at goal  Blood pressure at goal    Self Care Goals & Plans:  Self Care Goal 07/18/2015  Manage my medications take my medicines as prescribed; bring my medications to every visit; refill my medications on time  Monitor my health keep track of my blood glucose; bring my glucose meter and log to each visit  Eat healthy foods drink diet soda or water instead of juice or soda; eat more vegetables; eat foods that are low in salt; eat baked foods instead of fried foods; eat fruit for snacks and desserts  Be physically active find an activity I enjoy  Meeting treatment goals maintain the current self-care plan    No flowsheet data found.   Care Management & Community Referrals:  No flowsheet data found.

## 2015-09-04 NOTE — Progress Notes (Signed)
[facility] Subjective:   Patient ID: Dylan [name] male   DOB: September 01, 1945 70 y.o.   MRN: [id number]  HPI: DylanShi [name] is a 70 y.o. male with a PMH detailed below who presents for evaluation of left foot injury.  Please see problem based charting below for the status of his chronic medical problems.    Past Medical History  Diagnosis Date  . Essential hypertension 10/09/2014    Poor control with 5 drug therapy.   . DM (diabetes mellitus), type 2 with renal complications ([place]) 02/13/1693  . Pulmonary HTN ([place]) 10/18/2014    Noted as severe ECHO 2016. Likely 2/2 severe OSA.  . OSA on CPAP 10/08/2014  . Chronic renal failure, stage 3 (moderate) 10/18/2014    Baseline Cr about 1.5. Stable from 2010 to 2016.   Marland Kitchen Chronic diastolic heart failure ([place]) 10/18/2014    Noted ECHO 10/2014. Grade 2. EF 50-55%   . Anemia 10/18/2014    Baseline about 12 and stable from 2010 to 2016. Colon 2009 in [place] (records cannot be obtained).  EGD Dr Benson [place] 2011 nl   . Former tobacco use 10/08/2014  . Diverticulosis 10/08/2014    Seen on CT. Reportedly on Colon in [place] in 2009. Freq bouts of diverticulitis.   Current Outpatient Prescriptions  Medication Sig Dispense Refill  . albuterol (PROVENTIL HFA;VENTOLIN HFA) 108 (90 BASE) MCG/ACT inhaler Inhale 1-2 puffs into the lungs every 6 (six) hours as needed for wheezing or shortness of breath. (Patient not taking: Reported on 08/11/2015) 3 Inhaler 0  . alum & mag hydroxide-simeth (MAALOX/MYLANTA) 200-200-20 MG/5ML suspension Take 30 mLs by mouth every 6 (six) hours as needed for indigestion or heartburn (dyspepsia). (Patient not taking: Reported on 08/11/2015) 355 mL 0  . aspirin 81 MG chewable tablet Chew 1 tablet (81 mg total) by mouth daily. 90 tablet 3  . ciprofloxacin (CIPRO) 500 MG tablet Take 1 tablet (500 mg total) by mouth 2 (two) times daily. One po bid x 7 days 14 tablet 0  . colchicine 0.6 MG tablet Take 2 tablets for initial flare and then 1 tablet  an hour later. (Patient not taking: Reported on 08/11/2015) 90 tablet 0  . docusate sodium (COLACE) 100 MG capsule Take 1 capsule (100 mg total) by mouth 2 (two) times daily. 10 capsule 0  . docusate sodium (COLACE) 100 MG capsule Take 1 capsule (100 mg total) by mouth every 12 (twelve) hours. 60 capsule 0  . eplerenone (INSPRA) 50 MG tablet Take 1 tablet (50 mg total) by mouth daily. 90 tablet 3  . fluticasone (FLONASE) 50 MCG/ACT nasal spray Place 1-2 sprays into both nostrils daily. 16 g 2  . glipiZIDE (GLUCOTROL) 5 MG tablet Take 2 tablets (10 mg total) by mouth 2 (two) times daily before a meal. 360 tablet 3  . hydrALAZINE (APRESOLINE) 50 MG tablet Take 1 tablet (50 mg total) by mouth every 8 (eight) hours. 270 tablet 3  . HYDROcodone-acetaminophen (NORCO/VICODIN) 5-325 MG tablet Take 1-2 tablets by mouth every 4 (four) hours as needed for moderate pain or severe pain. 20 tablet 0  . Insulin Detemir (LEVEMIR) 100 UNIT/ML Pen Inject 20 Units into the skin daily at 10 pm. 30 mL 3  . ketotifen (ZADITOR) 0.025 % ophthalmic solution Place 1 drop into both eyes 2 (two) times daily.    Marland Kitchen labetalol (NORMODYNE) 200 MG tablet Take 2 tablets (400 mg total) by mouth 2 (two) times daily. 360 tablet 3  .  metroNIDAZOLE (FLAGYL) 500 MG tablet Take 1 tablet (500 mg total) by mouth 2 (two) times daily. One po bid x 7 days 14 tablet 0  . minoxidil (LONITEN) 10 MG tablet Take 2 tablets (20 mg total) by mouth daily. 180 tablet 3  . pravastatin (PRAVACHOL) 40 MG tablet Take 1 tablet (40 mg total) by mouth every evening. 30 tablet 11  . torsemide (DEMADEX) 10 MG tablet Take 1 tablet (10 mg total) by mouth 2 (two) times daily. 60 tablet 5  . verapamil (VERELAN PM) 360 MG 24 hr capsule Take 1 capsule (360 mg total) by mouth daily. 90 capsule 3   No current facility-administered medications for this visit.   No family history on file. Social History   Social History  . Marital Status: Legally Separated    Spouse  Name: N/A  . Number of Children: N/A  . Years of Education: N/A   Social History Main Topics  . Smoking status: Former Smoker    Quit date: 10/02/1972  . Smokeless tobacco: Never Used  . Alcohol Use: No  . Drug Use: No  . Sexual Activity: No   Other Topics Concern  . 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 Had yearly occupational testing inc PFT's. Now retired. Divorced. Has male sig other. Likes to go fishing.   Review of Systems: Review of Systems  Constitutional: Negative for fever, chills and weight loss.  Cardiovascular: Negative for chest pain.  Musculoskeletal: Negative for joint pain.  Neurological: Negative for headaches.     Objective:  Physical Exam: Filed Vitals:   09/04/15 0929  BP: 148/59  Pulse: 70  Temp: 98.8 F (37.1 C)  TempSrc: Oral  Weight: 209 lb 12.8 oz (95.165 kg)  SpO2: 95%  Physical Exam  Constitutional: He is well-developed, well-nourished, and in no distress.  Cardiovascular: Normal rate and regular rhythm.   Pulmonary/Chest: Effort normal and breath sounds normal. He has no wheezes. He has no rales.  Musculoskeletal:  Swelling dorasal aspect of left foot  Skin:  Small skin break on dorasal aspect of left foot, healing well without erythema, tenderness or drainage  Nursing note and vitals reviewed.   Assessment & Plan:  Case discussed with Dr. Lynnae January  Allergic rhinitis HPI: Patient report cough lasting for the last 2 months, this is associated with some rhinorrhea.  He notes that he has previously saw ENT who discussed a surgery for the issue but reported he was not a good candidate and recommended Flonase nasal spray.  He has not been using the nasal spray but he thinks that it helps his nasal congestion when he does.  A: Allergic rhinitis causing chronic cough  P:  Obtain CXR - Recommended using flonase daily until next appointment with Dr Lynnae January to see if this resolves his cough.  Foot injury HPI:  He reports he dropped a mirror on his left foot about 1 week ago.  There was a skin tear and he immediately cleaned the area with peroxide.  He has not had any significant tenderness of the area and no appreciated any drainage from the area.  He has kept the area clean.  He has had swelling which has yet to resolve.  A: Foot injury  P: Appears to be healing well, advised patient to continue to keep a close eye on the injury but that it currently looks like it is healing appropriately.  Do not feel need for Xrays at this time  given lack of tenderness.    Medications Ordered No orders of the defined types were placed in this encounter.   Other Orders Orders Placed This Encounter  Procedures  . DG Chest 2 View    Standing Status: Future     Number of Occurrences: 1     Standing Expiration Date: 11/01/2016    Order Specific Question:  Reason for Exam (SYMPTOM  OR DIAGNOSIS REQUIRED)    Answer:  cough x 2 months    Order Specific Question:  Preferred imaging location?    Answer:  [facility]   Follow Up: Return in about 1 month (around 10/02/2015).

## 2015-09-04 NOTE — Assessment & Plan Note (Signed)
HPI: He reports he dropped a mirror on his left foot about 1 week ago.  There was a skin tear and he immediately cleaned the area with peroxide.  He has not had any significant tenderness of the area and no appreciated any drainage from the area.  He has kept the area clean.  He has had swelling which has yet to resolve.  A: Foot injury  P: Appears to be healing well, advised patient to continue to keep a close eye on the injury but that it currently looks like it is healing appropriately.  Do not feel need for Xrays at this time given lack of tenderness.

## 2015-09-07 NOTE — Progress Notes (Signed)
Internal Medicine Clinic Attending  Case discussed with Dr. Heber [place] soon after the resident saw the patient.  We reviewed the resident's history and exam and pertinent patient test results.  I agree with the assessment, diagnosis, and plan of care documented in the resident's note.

## 2015-09-26 ENCOUNTER — Telehealth: Payer: Self-pay | Admitting: Internal Medicine

## 2015-09-26 NOTE — Telephone Encounter (Signed)
APPT. REMINDER CALL, NO ANSWER, NO VOICE MAIL

## 2015-09-28 ENCOUNTER — Ambulatory Visit (INDEPENDENT_AMBULATORY_CARE_PROVIDER_SITE_OTHER): Payer: Medicare Other | Admitting: Internal Medicine

## 2015-09-28 ENCOUNTER — Encounter: Payer: Self-pay | Admitting: Internal Medicine

## 2015-09-28 VITALS — BP 124/53 | HR 69 | Temp 97.9°F | Ht 66.0 in | Wt 207.2 lb

## 2015-09-28 DIAGNOSIS — E1122 Type 2 diabetes mellitus with diabetic chronic kidney disease: Secondary | ICD-10-CM

## 2015-09-28 DIAGNOSIS — N183 Chronic kidney disease, stage 3 unspecified: Secondary | ICD-10-CM

## 2015-09-28 DIAGNOSIS — K5900 Constipation, unspecified: Secondary | ICD-10-CM | POA: Diagnosis not present

## 2015-09-28 DIAGNOSIS — Z79899 Other long term (current) drug therapy: Secondary | ICD-10-CM

## 2015-09-28 DIAGNOSIS — Z794 Long term (current) use of insulin: Secondary | ICD-10-CM

## 2015-09-28 DIAGNOSIS — Z0000 Encounter for general adult medical examination without abnormal findings: Secondary | ICD-10-CM

## 2015-09-28 DIAGNOSIS — I129 Hypertensive chronic kidney disease with stage 1 through stage 4 chronic kidney disease, or unspecified chronic kidney disease: Secondary | ICD-10-CM

## 2015-09-28 DIAGNOSIS — M10071 Idiopathic gout, right ankle and foot: Secondary | ICD-10-CM | POA: Diagnosis not present

## 2015-09-28 DIAGNOSIS — I70219 Atherosclerosis of native arteries of extremities with intermittent claudication, unspecified extremity: Secondary | ICD-10-CM

## 2015-09-28 DIAGNOSIS — I152 Hypertension secondary to endocrine disorders: Secondary | ICD-10-CM

## 2015-09-28 DIAGNOSIS — I70213 Atherosclerosis of native arteries of extremities with intermittent claudication, bilateral legs: Secondary | ICD-10-CM

## 2015-09-28 DIAGNOSIS — Z87891 Personal history of nicotine dependence: Secondary | ICD-10-CM | POA: Diagnosis not present

## 2015-09-28 DIAGNOSIS — R0609 Other forms of dyspnea: Secondary | ICD-10-CM | POA: Diagnosis not present

## 2015-09-28 DIAGNOSIS — E1159 Type 2 diabetes mellitus with other circulatory complications: Secondary | ICD-10-CM

## 2015-09-28 DIAGNOSIS — Z9989 Dependence on other enabling machines and devices: Secondary | ICD-10-CM

## 2015-09-28 DIAGNOSIS — Z7982 Long term (current) use of aspirin: Secondary | ICD-10-CM

## 2015-09-28 DIAGNOSIS — I10 Essential (primary) hypertension: Secondary | ICD-10-CM

## 2015-09-28 DIAGNOSIS — G4733 Obstructive sleep apnea (adult) (pediatric): Secondary | ICD-10-CM | POA: Diagnosis not present

## 2015-09-28 DIAGNOSIS — M1000 Idiopathic gout, unspecified site: Secondary | ICD-10-CM

## 2015-09-28 LAB — GLUCOSE, CAPILLARY: Glucose-Capillary: 119 mg/dL — ABNORMAL HIGH (ref 65–99)

## 2015-09-28 MED ORDER — MINOXIDIL 10 MG PO TABS: 20.0000 mg | ORAL_TABLET | Freq: Every day | ORAL | Status: DC

## 2015-09-28 MED ORDER — TORSEMIDE 10 MG PO TABS: ORAL_TABLET | ORAL | Status: DC

## 2015-09-28 NOTE — Assessment & Plan Note (Signed)
His regimen is  Eplerone 50 QD Hydral 50 Q8 hrs Labetolol 400 BID Verapamil 360 QD Minoxidil 20 QD Demedex 10 AM and 20 in PM  However, he has been out of Demadex for over one month as pharmacy would not give it to him. He has not noticed any change in his fluid status and his exam is without peripheral edema or pul edema. He had a bottle of clonidine but confirmed he is not taking it. BP is high but that is likely being out of his Demedex. His most recent dose was 10 in AM and 20 in PM but it does cause polyuria so I encouraged, and sent the RF for, 20 in AM and 10 in PM. He is currently euvolemic but has never gotten vol contracted on the Demedex. He is not on an ACEI 2/2 renal failure.  PLAN : cont current meds except change Demedex to 20 AM and 10 PM BMP

## 2015-09-28 NOTE — Assessment & Plan Note (Signed)
Proptosis : He has had nl thyroid studies. He reports hat he went to his eye MD who asked about his thyroid. He has not yet sch his CT and I encouraged him to sch an complete this.   DOE : he gets DOE when he walks 30 yrds - both 2/2 claudication and DOE. He uses alb and it helps. He is using it 2 times weekly.  He stopped smoking 30 yrs ago. HE remembers getting PFT's a long time ago. Wants to wait to complete CT before PFT's.  Constipation - He had trouble having a BM last PM and it cont today. He gets this intermittently about one day per month. He has a med at home (unknown) and he takes it PRn. No blood or pain but does feel a bit bloated. Colon in 2015 with 5 yr F/U.

## 2015-09-28 NOTE — Progress Notes (Signed)
   Subjective:    Patient ID: Dylan [name], male    DOB: 1946-06-13, 70 y.o.   MRN: [id number]  HPI  Dylan [name] is here for HTN F/U. Please see the A&P for the status of the pt's chronic medical problems.  ROS : per ROS section and in problem oriented charting. All other systems are negative. PMHx, Soc hx, and / or Fam hx : His son lives with him. Has outdoors pool at complex and once open, will start walking in water. No exercising now. Quite tobacco 30 yrs ago.   Review of Systems  Constitutional: Negative for activity change and appetite change.  Eyes:       + B dry yes + B proptosis  Respiratory: Positive for shortness of breath and wheezing.   Cardiovascular: Negative for chest pain and leg swelling.  Gastrointestinal: Positive for constipation and abdominal distention. Negative for abdominal pain, diarrhea and blood in stool.  Musculoskeletal: Positive for myalgias. Negative for gait problem.       Objective:   Physical Exam  Constitutional: He is oriented to person, place, and time. He appears well-developed and well-nourished. No distress.  HENT:  Head: Normocephalic and atraumatic.  Right Ear: External ear normal.  Left Ear: External ear normal.  Nose: Nose normal.  Eyes: Conjunctivae and EOM are normal. Right eye exhibits no discharge. Left eye exhibits no discharge. No scleral icterus.  Proptosis L > R  Neck:  Vague fatty like tumor on L lateral neck, no ntender. No LAD  Cardiovascular: Normal rate and regular rhythm.   Murmur heard. Pulmonary/Chest: Effort normal and breath sounds normal.  Abdominal: Bowel sounds are normal.  Musculoskeletal: Normal range of motion. He exhibits no edema.  Neurological: He is alert and oriented to person, place, and time.  Skin: Skin is warm and dry. He is not diaphoretic.  Psychiatric: He has a normal mood and affect. His behavior is normal. Judgment and thought content normal.          Assessment & Plan:

## 2015-09-28 NOTE — Progress Notes (Signed)
CT Orbits re-scheduled for 3/29 - appt given to pt. Labs faxed to Dr Irven Shelling office, ophthalmologist, per Dr Zenovia Jarred request.

## 2015-09-28 NOTE — Assessment & Plan Note (Signed)
He has colchicine to be used PRN. Gout has been primarily in R great toe. Currently without sxs.  PLAN : cont colchicine prn

## 2015-09-28 NOTE — Assessment & Plan Note (Signed)
Cr has been slowly but steadily climbing. Likely 2/2 HTN and DM.  Sees Dr Justin Mend about yrly.  PLAN : BMP

## 2015-09-28 NOTE — Assessment & Plan Note (Signed)
He is on ASA and statin. He is not walking. His complex has a pool and he plans to start walking 2 - 3 times weekly once it opens. I offered cardiac rehab referral but he was not inclined at this time.  PLAN : encourage exercise.

## 2015-09-28 NOTE — Patient Instructions (Signed)
1. See me in 3 months 2. I sent in your refills to [facility] 3. Get the CT of your eyes 4. I will wend your thyroid to your eye doctor

## 2015-09-28 NOTE — Assessment & Plan Note (Signed)
Last A1C was 6.6 in Jan. Too early to recheck. He cont on his Levemir 20 and glucotrol 10 BID. He checks his CBG 3-4 times per week and all are < 150. He denies lows. He is UTD on his DM eye exam.  PLAN:  Cont current meds

## 2015-09-28 NOTE — Assessment & Plan Note (Signed)
Details are in hte overview and were reviewed today. He is not using BiPap bc his insurance will not cover it. This could be contributing to hs HTN and can increase overall mortality.  PLAN : look again into why insurance will not cover.

## 2015-09-29 ENCOUNTER — Encounter: Payer: Self-pay | Admitting: Internal Medicine

## 2015-09-29 LAB — BMP8+ANION GAP
Anion Gap: 19 mmol/L — ABNORMAL HIGH (ref 10.0–18.0)
BUN/Creatinine Ratio: 18 (ref 10–22)
BUN: 28 mg/dL — ABNORMAL HIGH (ref 8–27)
CO2: 19 mmol/L (ref 18–29)
Calcium: 9.1 mg/dL (ref 8.6–10.2)
Chloride: 106 mmol/L (ref 96–106)
Creatinine, Ser: 1.56 mg/dL — ABNORMAL HIGH (ref 0.76–1.27)
GFR calc Af Amer: 51 mL/min/{1.73_m2} — ABNORMAL LOW (ref >59)
GFR calc non Af Amer: 44 mL/min/{1.73_m2} — ABNORMAL LOW (ref >59)
Glucose: 139 mg/dL — ABNORMAL HIGH (ref 65–99)
Potassium: 3.5 mmol/L (ref 3.5–5.2)
Sodium: 144 mmol/L (ref 134–144)

## 2015-09-29 LAB — MICROALBUMIN / CREATININE URINE RATIO
Creatinine, Urine: 213.7 mg/dL
MICROALB/CREAT RATIO: 13.6 mg/g creat (ref 0.0–30.0)
Microalbumin, Urine: 29 ug/mL

## 2015-10-04 ENCOUNTER — Ambulatory Visit (HOSPITAL_COMMUNITY)
Admission: RE | Admit: 2015-10-04 | Discharge: 2015-10-04 | Disposition: A | Discharge status: Home / Self Care | Payer: Medicare Other | Source: Ambulatory Visit | Attending: Internal Medicine | Admitting: Internal Medicine

## 2015-10-04 ENCOUNTER — Encounter (HOSPITAL_COMMUNITY): Payer: Self-pay

## 2015-10-04 DIAGNOSIS — H0520 Unspecified exophthalmos: Secondary | ICD-10-CM | POA: Insufficient documentation

## 2015-10-04 DIAGNOSIS — R519 Headache, unspecified: Secondary | ICD-10-CM

## 2015-10-04 DIAGNOSIS — R51 Headache: Secondary | ICD-10-CM | POA: Insufficient documentation

## 2015-10-04 DIAGNOSIS — S0281XA Fracture of other specified skull and facial bones, right side, initial encounter for closed fracture: Secondary | ICD-10-CM | POA: Diagnosis not present

## 2015-10-05 ENCOUNTER — Telehealth: Payer: Self-pay | Admitting: Internal Medicine

## 2015-10-05 DIAGNOSIS — H0520 Unspecified exophthalmos: Secondary | ICD-10-CM

## 2015-10-05 NOTE — Telephone Encounter (Signed)
Dr Quay Burow forwarded me his CT orbit results. TSH and free T4 nl. Pt describes having to press eye back into socket so even though stable on CT, I am referring to optho. He has recently seen optometry. I tried to call pt - no answer.

## 2015-10-05 NOTE — Assessment & Plan Note (Signed)
Dr Burns forwarded me his CT orbit results. TSH and free T4 nl. Pt describes having to press eye back into socket so even though stable on CT, I am referring to optho. He has recently seen optometry. I tried to call pt - no answer. 

## 2015-11-02 DIAGNOSIS — H0520 Unspecified exophthalmos: Secondary | ICD-10-CM | POA: Diagnosis not present

## 2015-11-02 DIAGNOSIS — H0259 Other disorders affecting eyelid function: Secondary | ICD-10-CM | POA: Diagnosis not present

## 2015-11-02 DIAGNOSIS — H538 Other visual disturbances: Secondary | ICD-10-CM | POA: Diagnosis not present

## 2015-11-02 DIAGNOSIS — H2513 Age-related nuclear cataract, bilateral: Secondary | ICD-10-CM | POA: Diagnosis not present

## 2015-11-10 ENCOUNTER — Encounter (HOSPITAL_COMMUNITY): Payer: Medicare Other

## 2015-11-10 ENCOUNTER — Ambulatory Visit: Payer: Medicare Other | Admitting: Vascular Surgery

## 2015-11-27 DIAGNOSIS — N2581 Secondary hyperparathyroidism of renal origin: Secondary | ICD-10-CM | POA: Diagnosis not present

## 2015-11-27 DIAGNOSIS — E785 Hyperlipidemia, unspecified: Secondary | ICD-10-CM | POA: Diagnosis not present

## 2015-11-27 DIAGNOSIS — E1122 Type 2 diabetes mellitus with diabetic chronic kidney disease: Secondary | ICD-10-CM | POA: Diagnosis not present

## 2015-11-27 DIAGNOSIS — N183 Chronic kidney disease, stage 3 (moderate): Secondary | ICD-10-CM | POA: Diagnosis not present

## 2015-11-30 ENCOUNTER — Telehealth: Payer: Self-pay | Admitting: Internal Medicine

## 2015-11-30 MED ORDER — COLCHICINE 0.6 MG PO TABS: ORAL_TABLET | ORAL | Status: DC

## 2015-11-30 NOTE — Telephone Encounter (Signed)
Colchicine refill [facility] [address]

## 2015-11-30 NOTE — Telephone Encounter (Signed)
Discussed in March. Refilled med

## 2015-11-30 NOTE — Telephone Encounter (Signed)
Call made to patient, he states he's having a "gout flare" on the side of his right foot-Pain started yesterday, pt states he has had this before and knows it's a gout flare.  Requesting colchicine refill, will send to pcp for review.  Please advise.Despina Hidden Cassady5/25/201710:52 AM

## 2015-11-30 NOTE — Telephone Encounter (Signed)
Pt aware.Dylan Hidden Cassady5/25/20172:46 PM

## 2015-12-01 ENCOUNTER — Encounter (HOSPITAL_COMMUNITY): Payer: Self-pay | Admitting: Emergency Medicine

## 2015-12-01 ENCOUNTER — Emergency Department (HOSPITAL_COMMUNITY)
Admission: EM | Admit: 2015-12-01 | Discharge: 2015-12-01 | Disposition: A | Discharge status: Home / Self Care | Payer: Medicare Other | Attending: Emergency Medicine | Admitting: Emergency Medicine

## 2015-12-01 DIAGNOSIS — Z87891 Personal history of nicotine dependence: Secondary | ICD-10-CM | POA: Insufficient documentation

## 2015-12-01 DIAGNOSIS — Z794 Long term (current) use of insulin: Secondary | ICD-10-CM | POA: Diagnosis not present

## 2015-12-01 DIAGNOSIS — M7989 Other specified soft tissue disorders: Secondary | ICD-10-CM | POA: Diagnosis present

## 2015-12-01 DIAGNOSIS — M10071 Idiopathic gout, right ankle and foot: Secondary | ICD-10-CM | POA: Insufficient documentation

## 2015-12-01 DIAGNOSIS — Z7982 Long term (current) use of aspirin: Secondary | ICD-10-CM | POA: Diagnosis not present

## 2015-12-01 DIAGNOSIS — I130 Hypertensive heart and chronic kidney disease with heart failure and stage 1 through stage 4 chronic kidney disease, or unspecified chronic kidney disease: Secondary | ICD-10-CM | POA: Insufficient documentation

## 2015-12-01 DIAGNOSIS — I5032 Chronic diastolic (congestive) heart failure: Secondary | ICD-10-CM | POA: Insufficient documentation

## 2015-12-01 DIAGNOSIS — Z79899 Other long term (current) drug therapy: Secondary | ICD-10-CM | POA: Insufficient documentation

## 2015-12-01 DIAGNOSIS — M109 Gout, unspecified: Secondary | ICD-10-CM

## 2015-12-01 DIAGNOSIS — E1122 Type 2 diabetes mellitus with diabetic chronic kidney disease: Secondary | ICD-10-CM | POA: Insufficient documentation

## 2015-12-01 DIAGNOSIS — N183 Chronic kidney disease, stage 3 (moderate): Secondary | ICD-10-CM | POA: Insufficient documentation

## 2015-12-01 HISTORY — DX: Atherosclerotic heart disease of native coronary artery without angina pectoris: I25.10

## 2015-12-01 LAB — URINE MICROSCOPIC-ADD ON
Bacteria, UA: NONE SEEN
RBC / HPF: NONE SEEN RBC/hpf (ref 0–5)

## 2015-12-01 LAB — COMPREHENSIVE METABOLIC PANEL
ALT: 15 U/L — ABNORMAL LOW (ref 17–63)
AST: 17 U/L (ref 15–41)
Albumin: 4.1 g/dL (ref 3.5–5.0)
Alkaline Phosphatase: 53 U/L (ref 38–126)
Anion gap: 8 (ref 5–15)
BUN: 13 mg/dL (ref 6–20)
CO2: 26 mmol/L (ref 22–32)
Calcium: 9.4 mg/dL (ref 8.9–10.3)
Chloride: 110 mmol/L (ref 101–111)
Creatinine, Ser: 1.6 mg/dL — ABNORMAL HIGH (ref 0.61–1.24)
GFR calc Af Amer: 48 mL/min — ABNORMAL LOW (ref >60)
GFR calc non Af Amer: 42 mL/min — ABNORMAL LOW (ref >60)
Glucose, Bld: 140 mg/dL — ABNORMAL HIGH (ref 65–99)
Potassium: 3.5 mmol/L (ref 3.5–5.1)
Sodium: 144 mmol/L (ref 135–145)
Total Bilirubin: 0.7 mg/dL (ref 0.3–1.2)
Total Protein: 8.3 g/dL — ABNORMAL HIGH (ref 6.5–8.1)

## 2015-12-01 LAB — CBC WITH DIFFERENTIAL/PLATELET
Basophils Absolute: 0.1 10*3/uL (ref 0.0–0.1)
Basophils Relative: 1 %
Eosinophils Absolute: 0.2 10*3/uL (ref 0.0–0.7)
Eosinophils Relative: 2 %
HCT: 37.8 % — ABNORMAL LOW (ref 39.0–52.0)
Hemoglobin: 12.1 g/dL — ABNORMAL LOW (ref 13.0–17.0)
Lymphocytes Relative: 26 %
Lymphs Abs: 2.7 10*3/uL (ref 0.7–4.0)
MCH: 29.6 pg (ref 26.0–34.0)
MCHC: 32 g/dL (ref 30.0–36.0)
MCV: 92.4 fL (ref 78.0–100.0)
Monocytes Absolute: 1.4 10*3/uL — ABNORMAL HIGH (ref 0.1–1.0)
Monocytes Relative: 14 %
Neutro Abs: 5.8 10*3/uL (ref 1.7–7.7)
Neutrophils Relative %: 57 %
Platelets: 141 10*3/uL — ABNORMAL LOW (ref 150–400)
RBC: 4.09 MIL/uL — ABNORMAL LOW (ref 4.22–5.81)
RDW: 13.3 % (ref 11.5–15.5)
WBC: 10.1 10*3/uL (ref 4.0–10.5)

## 2015-12-01 LAB — URINALYSIS, ROUTINE W REFLEX MICROSCOPIC
Bilirubin Urine: NEGATIVE
Glucose, UA: NEGATIVE mg/dL
Hgb urine dipstick: NEGATIVE
Ketones, ur: NEGATIVE mg/dL
Leukocytes, UA: NEGATIVE
Nitrite: NEGATIVE
Protein, ur: 30 mg/dL — AB
Specific Gravity, Urine: 1.016 (ref 1.005–1.030)
pH: 6.5 (ref 5.0–8.0)

## 2015-12-01 LAB — I-STAT CG4 LACTIC ACID, ED: Lactic Acid, Venous: 1.24 mmol/L (ref 0.5–2.0)

## 2015-12-01 MED ORDER — MORPHINE SULFATE (PF) 4 MG/ML IV SOLN
4.0000 mg | Freq: Once | INTRAVENOUS | Status: AC
Start: 2015-12-01 — End: 2015-12-01
  Administered 2015-12-01: 4 mg via INTRAMUSCULAR
  Filled 2015-12-01: qty 1

## 2015-12-01 MED ORDER — ONDANSETRON 4 MG PO TBDP
8.0000 mg | ORAL_TABLET | Freq: Once | ORAL | Status: AC
  Administered 2015-12-01: 8 mg via ORAL
  Filled 2015-12-01: qty 2

## 2015-12-01 MED ORDER — OXYCODONE-ACETAMINOPHEN 5-325 MG PO TABS: 1.0000 | ORAL_TABLET | ORAL | Status: DC | PRN

## 2015-12-01 NOTE — ED Provider Notes (Signed)
CSN: [id number]     Arrival date & time 12/01/15  1114 History   First MD Initiated Contact with Patient 12/01/15 1253     Chief Complaint  Patient presents with  . Foot Swelling   PT IS HERE WITH RIGHT FOOT SWELLING.  PT HAS A HX OF GOUT AND THIS IS HIS TYPICAL GOUT FLARE.  PT CALLED HIS PCP TODAY TO REQUEST A COLCHICINE RX.  THE PT SAID THE PAIN IS BAD, SO HE CAME IN FOR SOME PAIN MEDS.  (Consider location/radiation/quality/duration/timing/severity/associated sxs/prior Treatment) The history is provided by the patient.    Past Medical History  Diagnosis Date  . Essential hypertension 10/09/2014    Poor control with 5 drug therapy.   . DM (diabetes mellitus), type 2 with renal complications ([place]) 09/13/9371  . Pulmonary HTN ([place]) 10/18/2014    Noted as severe ECHO 2016. Likely 2/2 severe OSA.  . OSA on CPAP 10/08/2014  . Chronic renal failure, stage 3 (moderate) 10/18/2014    Baseline Cr about 1.5. Stable from 2010 to 2016.   Marland Kitchen Chronic diastolic heart failure ([place]) 10/18/2014    Noted ECHO 10/2014. Grade 2. EF 50-55%   . Anemia 10/18/2014    Baseline about 12 and stable from 2010 to 2016. Colon 2009 in [place] (records cannot be obtained).  EGD Dr Benson [place] 2011 nl   . Former tobacco use 10/08/2014  . Diverticulosis 10/08/2014    Seen on CT. Reportedly on Colon in [place] in 2009. Freq bouts of diverticulitis.  . Coronary artery disease    Past Surgical History  Procedure Laterality Date  . Cholecystectomy     No family history on file. Social History  Substance Use Topics  . Smoking status: Former Smoker    Quit date: 10/02/1972  . Smokeless tobacco: Never Used  . Alcohol Use: No    Review of Systems  Musculoskeletal:       RIGHT FOOT PAIN/SWELLING  All other systems reviewed and are negative.     Allergies  Spironolactone  Home Medications   Prior to Admission medications   Medication Sig Start Date End Date Taking? Authorizing Provider  albuterol (PROVENTIL HFA;VENTOLIN HFA) 108 (90  BASE) MCG/ACT inhaler Inhale 1-2 puffs into the lungs every 6 (six) hours as needed for wheezing or shortness of breath. 12/08/14   Bartholomew Crews, MD  alum & mag hydroxide-simeth (MAALOX/MYLANTA) 200-200-20 MG/5ML suspension Take 30 mLs by mouth every 6 (six) hours as needed for indigestion or heartburn (dyspepsia). Patient not taking: Reported on 08/11/2015 10/13/14   Cristal Ford, DO  aspirin 81 MG chewable tablet Chew 1 tablet (81 mg total) by mouth daily. 06/15/15   Riccardo Dubin, MD  colchicine 0.6 MG tablet Take 2 pills at onset of gout flare then one pill one hour later. Then one pill daily until gout gone 11/30/15 11/30/19  Bartholomew Crews, MD  docusate sodium (COLACE) 100 MG capsule Take 1 capsule (100 mg total) by mouth every 12 (twelve) hours. Patient not taking: Reported on 09/28/2015 08/05/15   Charlesetta Shanks, MD  eplerenone (INSPRA) 50 MG tablet Take 1 tablet (50 mg total) by mouth daily. 12/08/14   Bartholomew Crews, MD  fluticasone (FLONASE) 50 MCG/ACT nasal spray Place 1-2 sprays into both nostrils daily. 06/15/15   Rushil Sherrye Payor, MD  glipiZIDE (GLUCOTROL) 5 MG tablet Take 2 tablets (10 mg total) by mouth 2 (two) times daily before a meal. 04/20/15   Bartholomew Crews, MD  hydrALAZINE (APRESOLINE)  50 MG tablet Take 1 tablet (50 mg total) by mouth every 8 (eight) hours. 07/18/15   Milagros Loll, MD  Insulin Detemir (LEVEMIR) 100 UNIT/ML Pen Inject 20 Units into the skin daily at 10 pm. 12/08/14   Bartholomew Crews, MD  ketotifen (ZADITOR) 0.025 % ophthalmic solution Place 1 drop into both eyes 2 (two) times daily.    Historical Provider, MD  labetalol (NORMODYNE) 200 MG tablet Take 2 tablets (400 mg total) by mouth 2 (two) times daily. 12/08/14   Bartholomew Crews, MD  minoxidil (LONITEN) 10 MG tablet Take 2 tablets (20 mg total) by mouth daily. 09/28/15   Bartholomew Crews, MD  oxyCODONE-acetaminophen (PERCOCET/ROXICET) 5-325 MG tablet Take 1 tablet by mouth every 4 (four)  hours as needed for severe pain. 12/01/15   Isla Pence, MD  pravastatin (PRAVACHOL) 40 MG tablet Take 1 tablet (40 mg total) by mouth every evening. 08/23/15 08/22/16  Bartholomew Crews, MD  torsemide (DEMADEX) 10 MG tablet Take 2 in morning and 1 in afternoon Patient not taking: Reported on 09/28/2015 09/28/15   Bartholomew Crews, MD  verapamil (VERELAN PM) 360 MG 24 hr capsule Take 1 capsule (360 mg total) by mouth daily. 12/08/14   Bartholomew Crews, MD   BP 158/68 mmHg  Pulse 78  Temp(Src) 99.8 F (37.7 C) (Oral)  Resp 16  SpO2 93% Physical Exam  Constitutional: He is oriented to person, place, and time. He appears well-developed and well-nourished.  HENT:  Head: Normocephalic and atraumatic.  Right Ear: External ear normal.  Left Ear: External ear normal.  Nose: Nose normal.  Mouth/Throat: Oropharynx is clear and moist.  Eyes: Conjunctivae and EOM are normal. Pupils are equal, round, and reactive to light.  Neck: Normal range of motion. Neck supple.  Cardiovascular: Normal rate, regular rhythm, normal heart sounds and intact distal pulses.   Pulmonary/Chest: Effort normal and breath sounds normal.  Abdominal: Soft. Bowel sounds are normal.  Musculoskeletal: He exhibits edema.  RIGHT FOOT WITH REDNESS AND SWELLING.  Neurological: He is alert and oriented to person, place, and time.  Skin: Skin is warm and dry.  Psychiatric: He has a normal mood and affect. His behavior is normal. Judgment and thought content normal.  Nursing note and vitals reviewed.   ED Course  Procedures (including critical care time) Labs Review Labs Reviewed  COMPREHENSIVE METABOLIC PANEL - Abnormal; Notable for the following:    Glucose, Bld 140 (*)    Creatinine, Ser 1.60 (*)    Total Protein 8.3 (*)    ALT 15 (*)    GFR calc non Af Amer 42 (*)    GFR calc Af Amer 48 (*)    All other components within normal limits  CBC WITH DIFFERENTIAL/PLATELET - Abnormal; Notable for the following:     RBC 4.09 (*)    Hemoglobin 12.1 (*)    HCT 37.8 (*)    Platelets 141 (*)    Monocytes Absolute 1.4 (*)    All other components within normal limits  CULTURE, BLOOD (ROUTINE X 2)  CULTURE, BLOOD (ROUTINE X 2)  URINE CULTURE  URINALYSIS, ROUTINE W REFLEX MICROSCOPIC (NOT AT [facility])  I-STAT CG4 LACTIC ACID, ED    Imaging Review No results found. I have personally reviewed and evaluated these images and lab results as part of my medical decision-making.   EKG Interpretation None      MDM  THIS IS PT'S NORMAL PRESENTATION FOR A GOUT FLARE.  I THINK THE REDNESS IS GOUT, NOT CELLULITIS.  PT INSTR TO RETURN IF WORSE. Final diagnoses:  Acute gout of right foot, unspecified cause        Isla Pence, MD 12/01/15 1326

## 2015-12-01 NOTE — Discharge Instructions (Signed)
Gout Gout is when your joints become red, sore, and swell (inflamed). This is caused by the buildup of uric acid crystals in the joints. Uric acid is a chemical that is normally in the blood. If the level of uric acid gets too high in the blood, these crystals form in your joints and tissues. Over time, these crystals can form into masses near the joints and tissues. These masses can destroy bone and cause the bone to look misshapen (deformed). HOME CARE   Do not take aspirin for pain.  Only take medicine as told by your doctor.  Rest the joint as much as you can. When in bed, keep sheets and blankets off painful areas.  Keep the sore joints raised (elevated).  Put warm or cold packs on painful joints. Use of warm or cold packs depends on which works best for you.  Use crutches if the painful joint is in your leg.  Drink enough fluids to keep your pee (urine) clear or pale yellow. Limit alcohol, sugary drinks, and drinks with fructose in them.  Follow your diet instructions. Pay careful attention to how much protein you eat. Include fruits, vegetables, whole grains, and fat-free or low-fat milk products in your daily diet. Talk to your doctor or dietitian about the use of coffee, vitamin C, and cherries. These may help lower uric acid levels.  Keep a healthy body weight. GET HELP RIGHT AWAY IF:   You have watery poop (diarrhea), throw up (vomit), or have any side effects from medicines.  You do not feel better in 24 hours, or you are getting worse.  Your joint becomes suddenly more tender, and you have chills or a fever. MAKE SURE YOU:   Understand these instructions.  Will watch your condition.  Will get help right away if you are not doing well or get worse.   This information is not intended to replace advice given to you by your health care provider. Make sure you discuss any questions you have with your health care provider.   Document Released: 04/02/2008 Document Revised:  07/15/2014 Document Reviewed: 02/05/2012 Elsevier Interactive Patient Education [employer].

## 2015-12-01 NOTE — ED Notes (Signed)
Not a code sepsis

## 2015-12-01 NOTE — ED Notes (Signed)
Pt is diabetic and has had rt foot pain, sore to touch and swelling x 3 days, is seen in the clinic

## 2015-12-02 LAB — URINE CULTURE

## 2015-12-06 LAB — CULTURE, BLOOD (ROUTINE X 2): Culture: NO GROWTH

## 2015-12-17 IMAGING — DX DG CHEST 2V
2 series · 2 of 2 positions shown · non-contrast
Comparison: October 10, 2014

CLINICAL DATA: Shortness of breath and wheezing for 1 day

EXAM:
CHEST  2 VIEW

[chest pa]
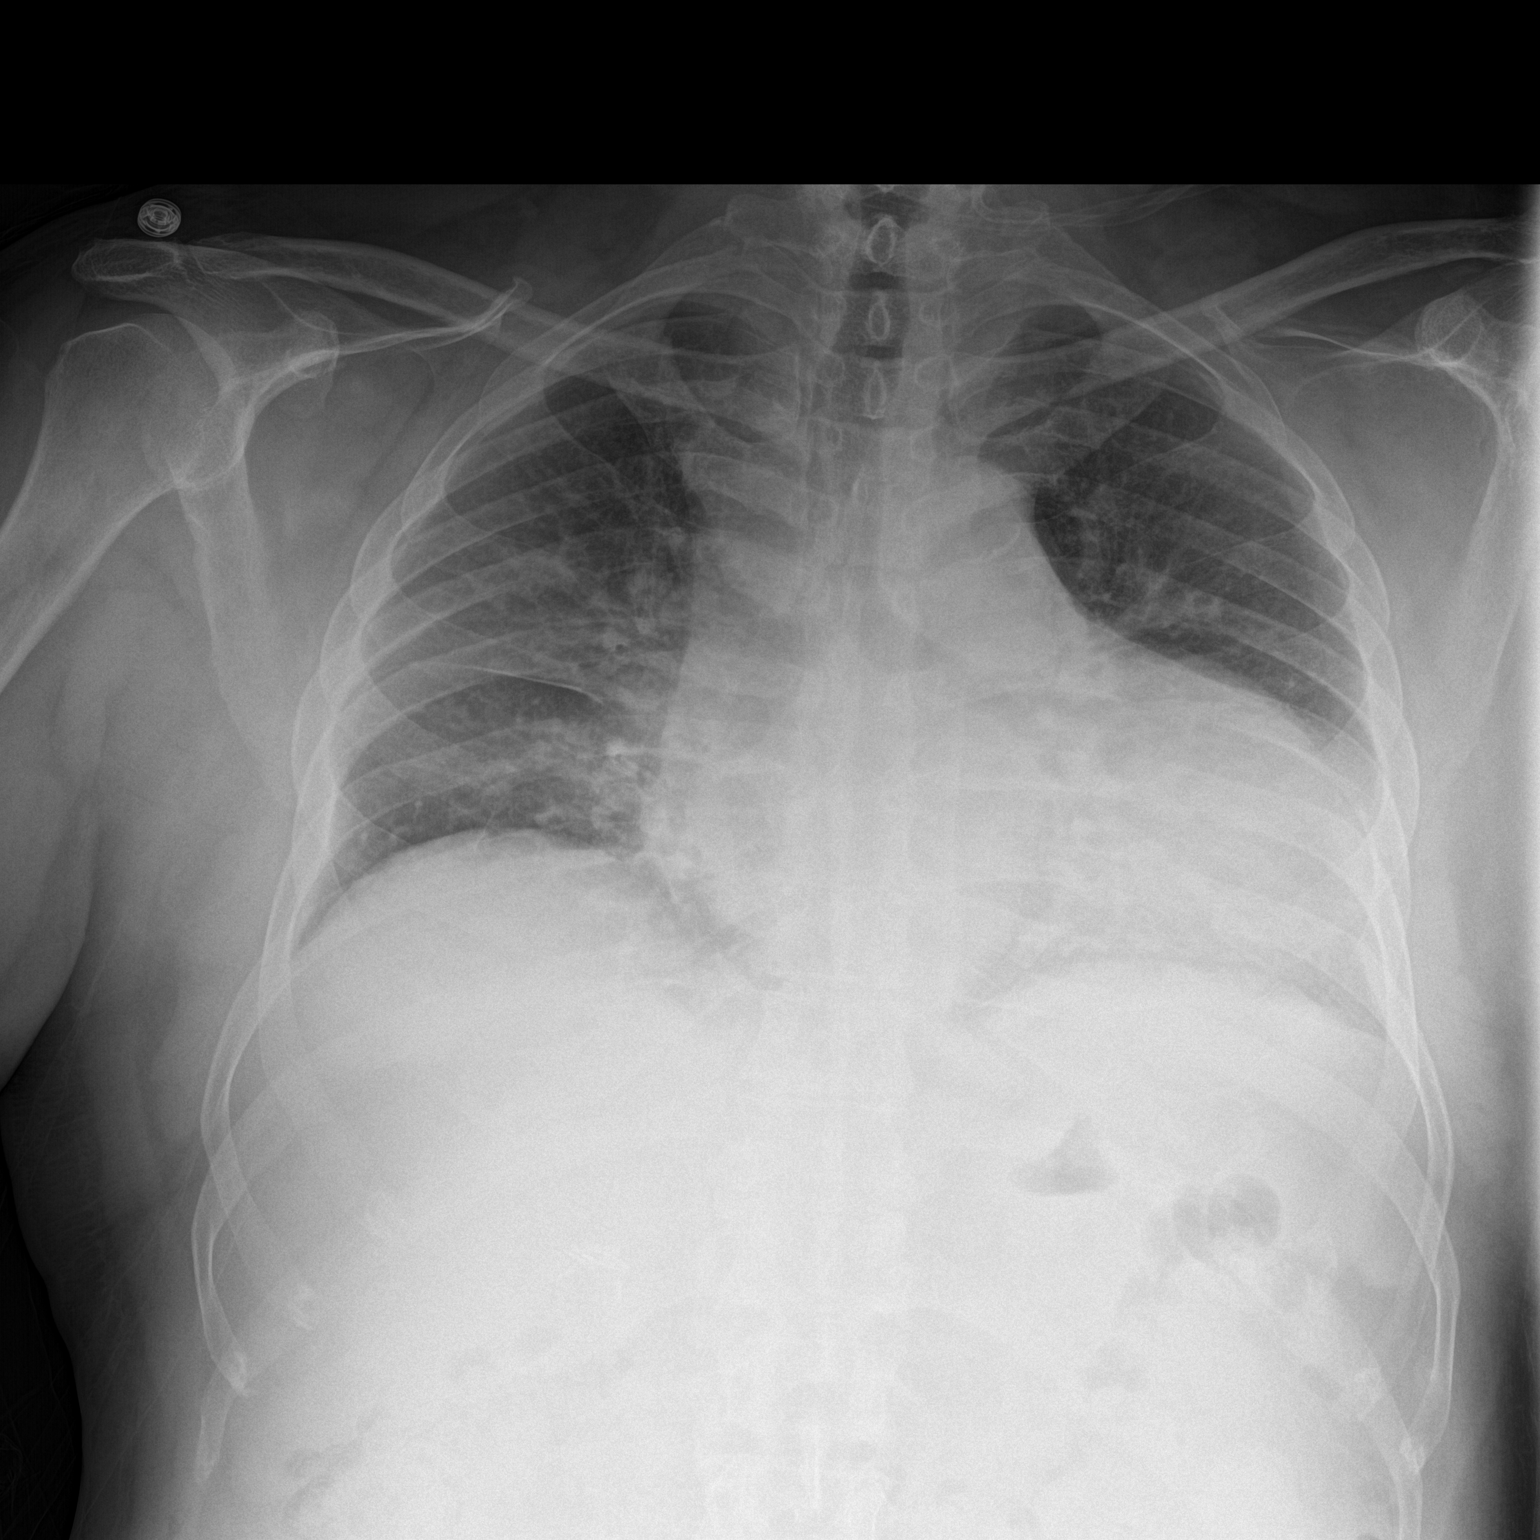

[chest lat]
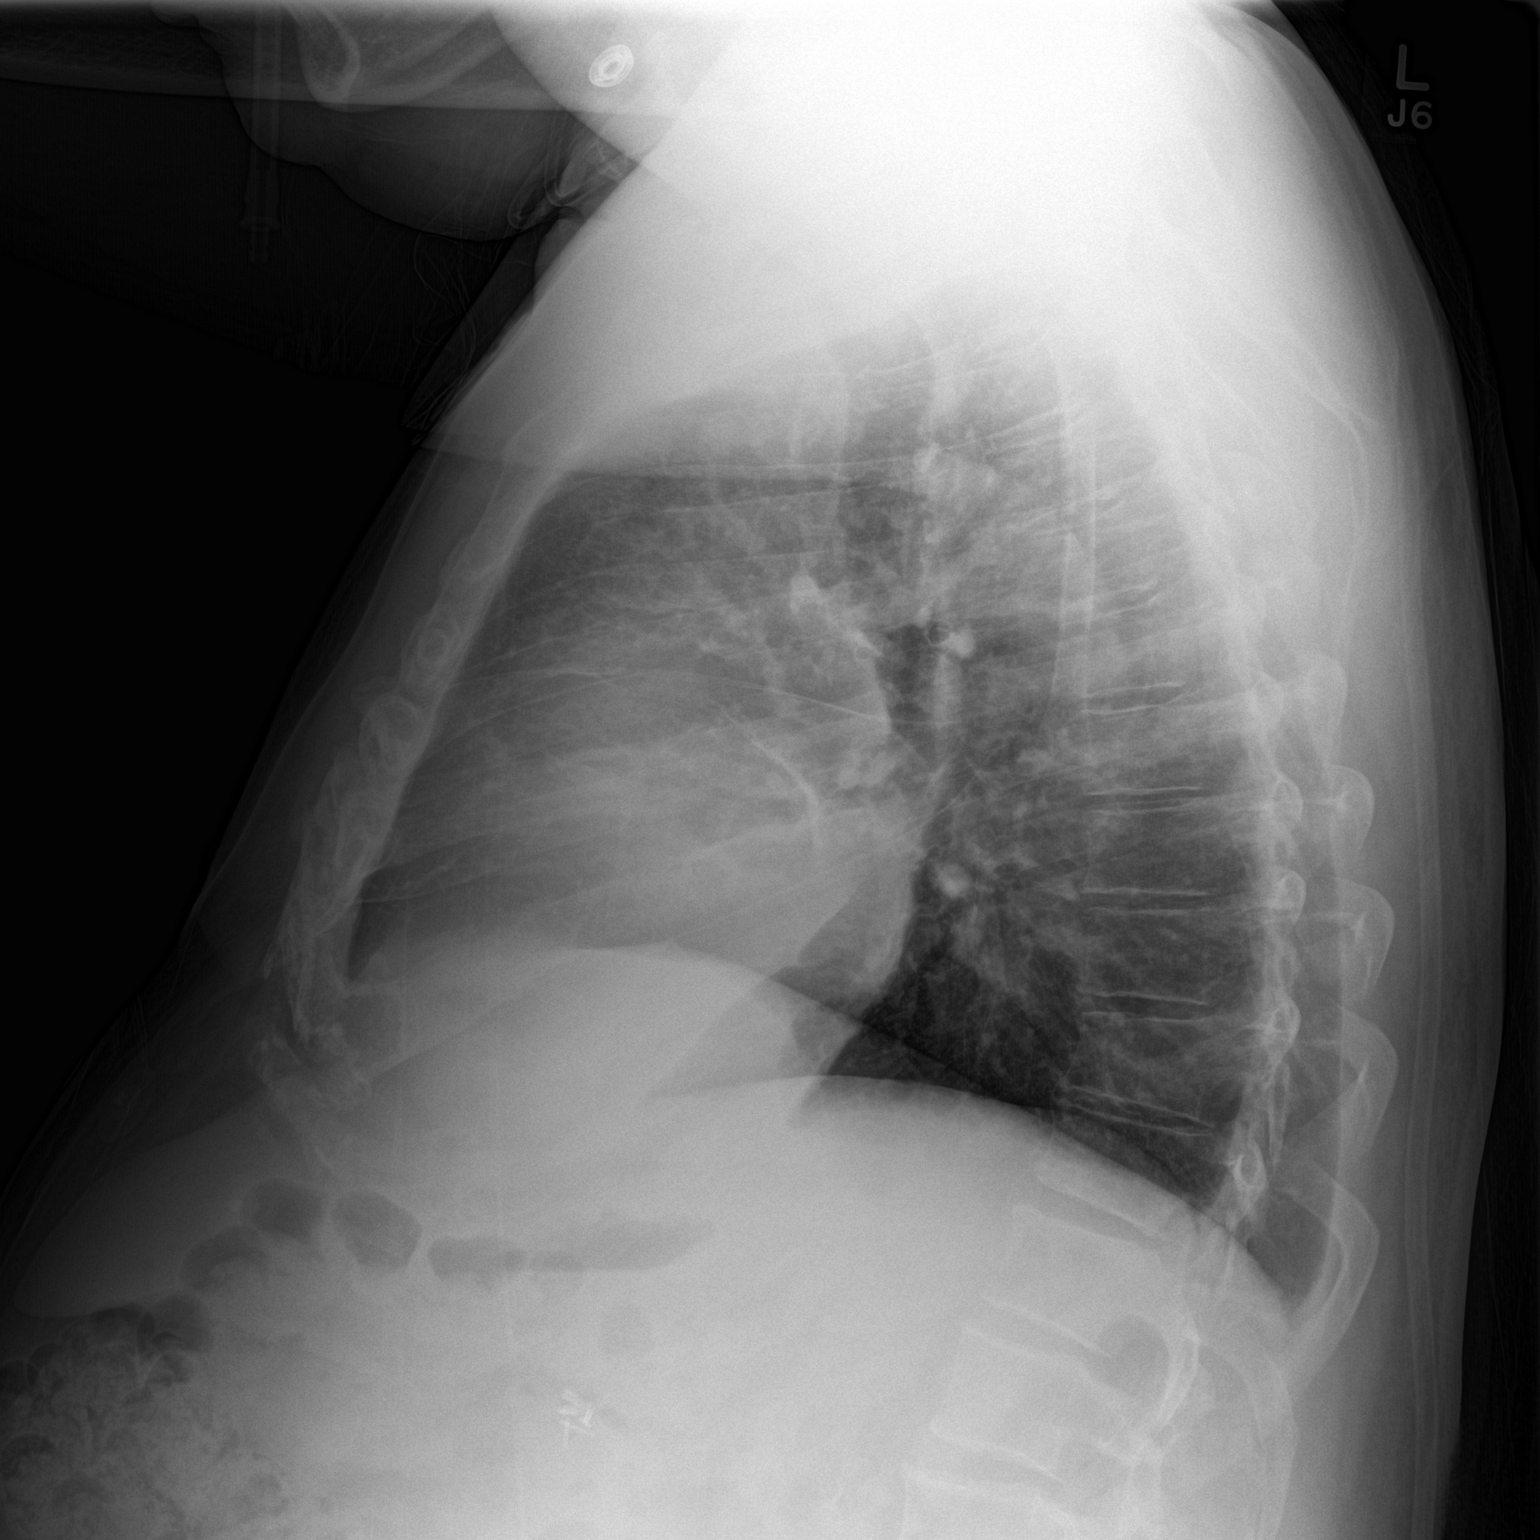

[2 of 2 positions shown; findings below may reference images not displayed]

FINDINGS: There is no edema or consolidation. Heart is slightly enlarged with
pulmonary vascularity within normal limits. No adenopathy. There is
mild anterior wedging of a lower thoracic vertebral body, stable.
IMPRESSION: Slight cardiomegaly.  No edema or consolidation.

## 2015-12-18 ENCOUNTER — Other Ambulatory Visit: Payer: Self-pay | Admitting: Internal Medicine

## 2015-12-21 ENCOUNTER — Other Ambulatory Visit: Payer: Self-pay | Admitting: *Deleted

## 2015-12-21 DIAGNOSIS — I70213 Atherosclerosis of native arteries of extremities with intermittent claudication, bilateral legs: Secondary | ICD-10-CM

## 2015-12-27 ENCOUNTER — Other Ambulatory Visit: Payer: Self-pay | Admitting: Internal Medicine

## 2015-12-28 ENCOUNTER — Ambulatory Visit (INDEPENDENT_AMBULATORY_CARE_PROVIDER_SITE_OTHER): Payer: Medicare Other | Admitting: Internal Medicine

## 2015-12-28 ENCOUNTER — Encounter: Payer: Self-pay | Admitting: Internal Medicine

## 2015-12-28 VITALS — BP 149/58 | HR 78 | Temp 98.7°F | Ht 66.0 in | Wt 206.2 lb

## 2015-12-28 DIAGNOSIS — Z794 Long term (current) use of insulin: Secondary | ICD-10-CM

## 2015-12-28 DIAGNOSIS — N183 Chronic kidney disease, stage 3 unspecified: Secondary | ICD-10-CM

## 2015-12-28 DIAGNOSIS — Z87891 Personal history of nicotine dependence: Secondary | ICD-10-CM

## 2015-12-28 DIAGNOSIS — I10 Essential (primary) hypertension: Secondary | ICD-10-CM

## 2015-12-28 DIAGNOSIS — E1159 Type 2 diabetes mellitus with other circulatory complications: Secondary | ICD-10-CM | POA: Diagnosis not present

## 2015-12-28 DIAGNOSIS — E1122 Type 2 diabetes mellitus with diabetic chronic kidney disease: Secondary | ICD-10-CM

## 2015-12-28 DIAGNOSIS — M1000 Idiopathic gout, unspecified site: Secondary | ICD-10-CM

## 2015-12-28 DIAGNOSIS — M109 Gout, unspecified: Secondary | ICD-10-CM

## 2015-12-28 DIAGNOSIS — H0520 Unspecified exophthalmos: Secondary | ICD-10-CM | POA: Diagnosis not present

## 2015-12-28 DIAGNOSIS — I152 Hypertension secondary to endocrine disorders: Secondary | ICD-10-CM | POA: Diagnosis not present

## 2015-12-28 DIAGNOSIS — G4733 Obstructive sleep apnea (adult) (pediatric): Secondary | ICD-10-CM

## 2015-12-28 LAB — POCT GLYCOSYLATED HEMOGLOBIN (HGB A1C): Hemoglobin A1C: 6.6

## 2015-12-28 LAB — GLUCOSE, CAPILLARY: Glucose-Capillary: 215 mg/dL — ABNORMAL HIGH (ref 65–99)

## 2015-12-28 NOTE — Patient Instructions (Signed)
Great job with your sugar I am ordering lung testing I will look into the sleep mask

## 2015-12-29 ENCOUNTER — Encounter: Payer: Self-pay | Admitting: Vascular Surgery

## 2015-12-29 ENCOUNTER — Encounter: Payer: Self-pay | Admitting: Internal Medicine

## 2015-12-29 NOTE — Assessment & Plan Note (Signed)
Saw Dr Justin Mend about 2 weeks ago. Will review notes when they arrive. Was told Cr good.  PLAN : cont to manage RF

## 2015-12-29 NOTE — Assessment & Plan Note (Signed)
He gets DOE and is agreeable to get PFT's.

## 2015-12-29 NOTE — Assessment & Plan Note (Addendum)
On  Eplerone 50 QD Hydral 50 Q8 hrs Labetolol 400 BID Verapamil 360 QD Minoxidil 20 QD Demedex 20 AM and 10 in PM  BP had been great last appt - 124/53. Today, a bit higher at 149/58 on repeat. No SE to meds.  PLAN:  Cont current meds

## 2015-12-29 NOTE — Progress Notes (Signed)
Subjective:    Patient ID: Dylan [name], male    DOB: 02-20-1946, 70 y.o.   MRN: [id number]  HPI  Dylan [name] is here for HTN F/U. Please see the A&P for the status of the pt's chronic medical problems.  ROS : per ROS section and in problem oriented charting. All other systems are negative.  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 He starting drinking as a teen and would drink to passing out. Couldn't keep job, have family. He quit drinking and smoking cold [place] with help of his faith.  Review of Systems  Constitutional: Negative for unexpected weight change.  Eyes:       Eyes not "popping out" since he starting wearing sunglasses. Sun did bother eyes.  Respiratory:       +DOE  Gastrointestinal: Positive for constipation. Negative for blood in stool.  Endocrine:       No hypoglycemic sxs       Objective:   Physical Exam  Constitutional: He is oriented to person, place, and time. He appears well-developed and well-nourished. No distress.  HENT:  Head: Normocephalic and atraumatic.  Right Ear: External ear normal.  Left Ear: External ear normal.  Nose: Nose normal.  Eyes: Conjunctivae and EOM are normal.  Eyes are prominent but seem to be proptosis   Cardiovascular: Normal rate and regular rhythm.   Murmur heard. Occ premature beat  Pulmonary/Chest: Effort normal and breath sounds normal. No respiratory distress.  Musculoskeletal: Normal range of motion. He exhibits no edema.  Neurological: He is alert and oriented to person, place, and time.  Skin: Skin is warm and dry. He is not diaphoretic.  Psychiatric: He has a normal mood and affect. His behavior is normal. Judgment and thought content normal.          Assessment & Plan:

## 2015-12-29 NOTE — Assessment & Plan Note (Signed)
States he saw optho and told everything Ok. Will need toe get notes.

## 2015-12-29 NOTE — Assessment & Plan Note (Signed)
A1C remains great at 6.6. On levemir 20 and glucotrol 10 BID. He occ checks his CBG at home and all < 150. He has low's to about 60-70 once every two months. His A1C might be just a bit too low but so far not too worried bc no sig or freq lows. In future could consider decreasing or stopping glipizide. Could change to metformin (one note when I first met him indicated he thought Justin Mend had took him off metformin 2/2 GI issues). Saw optho - will need to get note. Needs foot exam next appt.  PLAN:  Cont current meds

## 2015-12-29 NOTE — Assessment & Plan Note (Signed)
His recent gout flare lasted about 7 days. The colchicine did work but not fast enough and he ws in a lot of pain and went to ED. Knows to use colchicine PRN for flares and understands dosing - 2 then 1 in one hour then QD to resolution.   PLAN:  Cont current meds

## 2015-12-29 NOTE — Assessment & Plan Note (Signed)
Will check with [facility] to see if insurance will now pay.

## 2016-01-01 ENCOUNTER — Ambulatory Visit (HOSPITAL_COMMUNITY)
Admission: RE | Admit: 2016-01-01 | Discharge: 2016-01-01 | Disposition: A | Discharge status: Home / Self Care | Payer: Medicare Other | Source: Ambulatory Visit | Attending: Vascular Surgery | Admitting: Vascular Surgery

## 2016-01-01 DIAGNOSIS — E1122 Type 2 diabetes mellitus with diabetic chronic kidney disease: Secondary | ICD-10-CM | POA: Insufficient documentation

## 2016-01-01 DIAGNOSIS — I130 Hypertensive heart and chronic kidney disease with heart failure and stage 1 through stage 4 chronic kidney disease, or unspecified chronic kidney disease: Secondary | ICD-10-CM | POA: Insufficient documentation

## 2016-01-01 DIAGNOSIS — I2510 Atherosclerotic heart disease of native coronary artery without angina pectoris: Secondary | ICD-10-CM | POA: Diagnosis not present

## 2016-01-01 DIAGNOSIS — Z87891 Personal history of nicotine dependence: Secondary | ICD-10-CM | POA: Diagnosis not present

## 2016-01-01 DIAGNOSIS — I70213 Atherosclerosis of native arteries of extremities with intermittent claudication, bilateral legs: Secondary | ICD-10-CM | POA: Insufficient documentation

## 2016-01-01 DIAGNOSIS — N183 Chronic kidney disease, stage 3 (moderate): Secondary | ICD-10-CM | POA: Diagnosis not present

## 2016-01-01 DIAGNOSIS — I5032 Chronic diastolic (congestive) heart failure: Secondary | ICD-10-CM | POA: Insufficient documentation

## 2016-01-01 DIAGNOSIS — R938 Abnormal findings on diagnostic imaging of other specified body structures: Secondary | ICD-10-CM | POA: Insufficient documentation

## 2016-01-01 DIAGNOSIS — I272 Other secondary pulmonary hypertension: Secondary | ICD-10-CM | POA: Diagnosis not present

## 2016-01-01 DIAGNOSIS — G4733 Obstructive sleep apnea (adult) (pediatric): Secondary | ICD-10-CM | POA: Diagnosis not present

## 2016-01-01 DIAGNOSIS — R0989 Other specified symptoms and signs involving the circulatory and respiratory systems: Secondary | ICD-10-CM | POA: Diagnosis present

## 2016-01-04 NOTE — Progress Notes (Signed)
Established Intermittent Claudication  History of Present Illness  Dylan [name] is a 70 y.o. (08-10-1945) male who presents with chief complaint: pain in feet.  The patient's symptoms have not progressed.  The patient's symptoms are: foot pain with walking.  Cramping in calves mostly resolved.  occasional left hip pain noted with walking.  The patient's treatment regimen currently included: maximal medical management and walking plan.  The patient's PMH, PSH, and SH, and FamHx are unchanged from 08/11/15.  Current Outpatient Prescriptions  Medication Sig Dispense Refill  . albuterol (PROVENTIL HFA;VENTOLIN HFA) 108 (90 BASE) MCG/ACT inhaler Inhale 1-2 puffs into the lungs every 6 (six) hours as needed for wheezing or shortness of breath. 3 Inhaler 0  . alum & mag hydroxide-simeth (MAALOX/MYLANTA) 200-200-20 MG/5ML suspension Take 30 mLs by mouth every 6 (six) hours as needed for indigestion or heartburn (dyspepsia). (Patient not taking: Reported on 08/11/2015) 355 mL 0  . aspirin 81 MG chewable tablet Chew 1 tablet (81 mg total) by mouth daily. 90 tablet 3  . colchicine 0.6 MG tablet Take 2 pills at onset of gout flare then one pill one hour later. Then one pill daily until gout gone (Patient not taking: Reported on 12/29/2015) 30 tablet 1  . docusate sodium (COLACE) 100 MG capsule Take 1 capsule (100 mg total) by mouth every 12 (twelve) hours. 60 capsule 0  . eplerenone (INSPRA) 50 MG tablet TAKE ONE TABLET BY MOUTH ONCE DAILY 90 tablet 3  . fluticasone (FLONASE) 50 MCG/ACT nasal spray Place 1-2 sprays into both nostrils daily. 16 g 2  . glipiZIDE (GLUCOTROL) 5 MG tablet Take 2 tablets (10 mg total) by mouth 2 (two) times daily before a meal. 360 tablet 3  . hydrALAZINE (APRESOLINE) 50 MG tablet Take 1 tablet (50 mg total) by mouth every 8 (eight) hours. 270 tablet 3  . Insulin Detemir (LEVEMIR) 100 UNIT/ML Pen Inject 20 Units into the skin daily at 10 pm. 30 mL 3  . ketotifen (ZADITOR) 0.025 %  ophthalmic solution Place 1 drop into both eyes 2 (two) times daily.    Marland Kitchen labetalol (NORMODYNE) 200 MG tablet TAKE TWO TABLETS BY MOUTH TWICE DAILY 360 tablet 3  . minoxidil (LONITEN) 10 MG tablet Take 2 tablets (20 mg total) by mouth daily. 180 tablet 3  . oxyCODONE-acetaminophen (PERCOCET/ROXICET) 5-325 MG tablet Take 1 tablet by mouth every 4 (four) hours as needed for severe pain. 10 tablet 0  . pravastatin (PRAVACHOL) 40 MG tablet Take 1 tablet (40 mg total) by mouth every evening. 30 tablet 11  . torsemide (DEMADEX) 10 MG tablet Take 2 in morning and 1 in afternoon 270 tablet 3  . verapamil (VERELAN PM) 360 MG 24 hr capsule Take 1 capsule (360 mg total) by mouth daily. 90 capsule 3   No current facility-administered medications for this visit.    Allergies  Allergen Reactions  . Spironolactone Other (See Comments)    Gynecomastia per pt report.    On ROS today: no rest pain, no wounds.   Physical Examination  Filed Vitals:   01/05/16 1104  BP: 132/60  Pulse: 68  Temp: 97.8 F (36.6 C)  TempSrc: Oral  Resp: 18  Height: _0  (1.676 m)  Weight: 206 lb (93.441 kg)  SpO2: 92%   Body mass index is 33.27 kg/(m^2).  General: A&O x 3, WDWN  Pulmonary: Sym exp, good air movt, CTAB, no rales, rhonchi, & wheezing  Cardiac: RRR, Nl S1, S2, no Murmurs,  rubs or gallops  Vascular: Vessel Right Left  Radial Palpable Palpable  Brachial Palpable Palpable  Carotid Palpable, without bruit Palpable, without bruit  Aorta Not palpable N/A  Femoral Palpable Palpable  Popliteal Not palpable Not palpable  PT Not Palpable Not Palpable  DP Not Palpable Not Palpable   Gastrointestinal: soft, NTND, no G/R, no HSM, no masses, no CVAT B  Musculoskeletal: M/S 5/5 throughout , Extremities without ischemic changes   Neurologic: Pain and light touch intact in extremities , Motor exam as listed above   Non-Invasive Vascular Imaging ABI (Date: 01/04/2016)  R:   ABI: 0.49 (0.49),    DP: damp mono,   PT: damp mono,   TBI: 0.23 (0.28)  L:   ABI: 0.69 (0.72),   DP: damp mono,   PT: damp mono ,   TBI: 0.26 (0.43)   Medical Decision Making  Dylan [name] is a 70 y.o. male who presents with:  bilateral leg non-lifestyle limiting intermittent claudication without evidence of critical limb ischemia.   Based on the patient's vascular studies and examination, I have offered the patient: q6 month ABI.  Waveforms in legs look a bit worse bilaterally, but the patient's sx remain non-lifestyle limiting.  I discussed in depth with the patient the nature of atherosclerosis, and emphasized the importance of maximal medical management including strict control of blood pressure, blood glucose, and lipid levels, antiplatelet agents, obtaining regular exercise, and cessation of smoking.    The patient is aware that without maximal medical management the underlying atherosclerotic disease process will progress, limiting the benefit of any interventions. The patient is currently on a statin: Pravachol. The patient is currently on an anti-platelet: ASA.  Thank you for allowing [place] to participate in this patient's care.   Adele Barthel, MD, FACS [facility] Office: [phone] Pager: [phone]

## 2016-01-05 ENCOUNTER — Ambulatory Visit (INDEPENDENT_AMBULATORY_CARE_PROVIDER_SITE_OTHER): Payer: Medicare Other | Admitting: Vascular Surgery

## 2016-01-05 ENCOUNTER — Encounter: Payer: Self-pay | Admitting: Vascular Surgery

## 2016-01-05 VITALS — BP 132/60 | HR 68 | Temp 97.8°F | Resp 18 | Ht 66.0 in | Wt 206.0 lb

## 2016-01-05 DIAGNOSIS — I70209 Unspecified atherosclerosis of native arteries of extremities, unspecified extremity: Secondary | ICD-10-CM

## 2016-01-05 DIAGNOSIS — I70213 Atherosclerosis of native arteries of extremities with intermittent claudication, bilateral legs: Secondary | ICD-10-CM | POA: Diagnosis not present

## 2016-01-12 ENCOUNTER — Other Ambulatory Visit: Payer: Self-pay | Admitting: *Deleted

## 2016-01-12 DIAGNOSIS — I70209 Unspecified atherosclerosis of native arteries of extremities, unspecified extremity: Secondary | ICD-10-CM

## 2016-01-12 MED ORDER — PRAVASTATIN SODIUM 40 MG PO TABS: 40.0000 mg | ORAL_TABLET | Freq: Every evening | ORAL | Status: DC

## 2016-01-12 NOTE — Telephone Encounter (Signed)
Received faxed refill request from pt's pharmcy with the following statement "Patient's insurance is requesting a 90-day supply be dispensed on future fills. If approved can you please send a new rx with appropriate refills.? Thank you".  Wil send to pt's pcp for review, please advise.Despina Hidden Cassady7/7/20172:54 PM

## 2016-01-12 NOTE — Telephone Encounter (Signed)
Done

## 2016-01-17 ENCOUNTER — Other Ambulatory Visit: Payer: Self-pay | Admitting: Internal Medicine

## 2016-01-18 NOTE — Telephone Encounter (Signed)
End of Sep / Oct appt Lynnae January HTN FU

## 2016-01-30 ENCOUNTER — Encounter: Payer: Self-pay | Admitting: Internal Medicine

## 2016-01-30 ENCOUNTER — Ambulatory Visit (INDEPENDENT_AMBULATORY_CARE_PROVIDER_SITE_OTHER): Payer: Medicare Other | Admitting: Internal Medicine

## 2016-01-30 VITALS — BP 153/57 | HR 67 | Temp 99.0°F | Ht 66.0 in | Wt 206.7 lb

## 2016-01-30 DIAGNOSIS — M79671 Pain in right foot: Secondary | ICD-10-CM | POA: Diagnosis not present

## 2016-01-30 DIAGNOSIS — E119 Type 2 diabetes mellitus without complications: Secondary | ICD-10-CM

## 2016-01-30 DIAGNOSIS — M7989 Other specified soft tissue disorders: Secondary | ICD-10-CM

## 2016-01-30 DIAGNOSIS — M1000 Idiopathic gout, unspecified site: Secondary | ICD-10-CM

## 2016-01-30 DIAGNOSIS — Z87891 Personal history of nicotine dependence: Secondary | ICD-10-CM

## 2016-01-30 MED ORDER — IBUPROFEN 200 MG PO TABS: 400.0000 mg | ORAL_TABLET | Freq: Two times a day (BID) | ORAL | 2 refills | Status: AC | PRN

## 2016-01-30 NOTE — Assessment & Plan Note (Signed)
Assessment Roughly 2 weeks ago, he noted acute onset of right foot swelling. His symptoms started with pain of the fifth digit on the right foot that evolved to swelling of the dorsum followed by pain of the big toe. He has tried taking colchicine 0.6 mg as prescribed to him but he ran out of medication. He denies any associated fever, chills, drainage, injury to his foot.   Reviewing the chart, it also appears he was tried on prednisone at some point in the past. Today, he recalls this vaguely and feels this medication is more prone to cause him adverse effects than the colchicine. He denies any allergies to NSAIDs or intolerance of these medications. He is currently limited financially.  Physical exam findings suggest acute gout flare though the time course and his history are somewhat unusual. Given his diabetes, I would also be concerned for infectious etiology though his foot exam today is reassuring for no signs of infection.   Plan -Recommended he try ibuprofen 400 mg every 12 hours as needed for the next 3 days to see if it improves his pain and swelling -Follow-up in 3 days if his symptoms are not improving

## 2016-01-30 NOTE — Progress Notes (Signed)
CC: right foot swelling  HPI:  [name] is a 70 y.o. male who presents today with his male friend for right foot swelling. Please see assessment & plan for status of chronic medical problems.   Past Medical History:  Diagnosis Date  . Anemia 10/18/2014   Baseline about 12 and stable from 2010 to 2016. Colon 2009 in [place] (records cannot be obtained).  EGD Dr Benson [place] 2011 nl   . Chronic diastolic heart failure ([place]) 10/18/2014   Noted ECHO 10/2014. Grade 2. EF 50-55%   . Chronic renal failure, stage 3 (moderate) 10/18/2014   Baseline Cr about 1.5. Stable from 2010 to 2016.   Marland Kitchen Coronary artery disease   . Diverticulosis 10/08/2014   Seen on CT. Reportedly on Colon in [place] in 2009. Freq bouts of diverticulitis.  . DM (diabetes mellitus), type 2 with renal complications ([place]) 0/07/7508  . Essential hypertension 10/09/2014   Poor control with 5 drug therapy.   . Former tobacco use 10/08/2014  . OSA on CPAP 10/08/2014  . Pulmonary HTN ([place]) 10/18/2014   Noted as severe ECHO 2016. Likely 2/2 severe OSA.    Review of Systems:  Please see each problem below for a pertinent review of systems.   Physical Exam:  Vitals:   01/30/16 0843  BP: (!) 153/57  Pulse: 67  Temp: 99 F (37.2 C)  TempSrc: Oral  SpO2: 95%  Weight: 206 lb 11.2 oz (93.8 kg)  Height: 5' 6" (1.676 m)   Constitutional: Elderly African-American male. No distress.  Cardiovascular: 1+ dorsalis pedis pulse on the right foot as compared to 2+ on the left foot. Pulmonary/Chest: Effort normal. No respiratory distress. No wheezes, rales.  Musculoskeletal: Exquisite tenderness noted overlying the first MTP joint of the right foot. Swelling of the dorsum of the right foot as compared to left. No calf tenderness noted of the right leg. Skin: No erythema appreciated overlying the right foot dorsum or right lower extremity. Mild warmth to touch.    Assessment & Plan:   See Encounters Tab for problem based  charting.  Gout Assessment Roughly 2 weeks ago, he noted acute onset of right foot swelling. His symptoms started with pain of the fifth digit on the right foot that evolved to swelling of the dorsum followed by pain of the big toe. He has tried taking colchicine 0.6 mg as prescribed to him but he ran out of medication. He denies any associated fever, chills, drainage, injury to his foot.   Reviewing the chart, it also appears he was tried on prednisone at some point in the past. Today, he recalls this vaguely and feels this medication is more prone to cause him adverse effects than the colchicine. He denies any allergies to NSAIDs or intolerance of these medications. He is currently limited financially.  Physical exam findings suggest acute gout flare though the time course and his history are somewhat unusual. Given his diabetes, I would also be concerned for infectious etiology though his foot exam today is reassuring for no signs of infection.   Plan -Recommended he try ibuprofen 400 mg every 12 hours as needed for the next 3 days to see if it improves his pain and swelling -Follow-up in 3 days if his symptoms are not improving   Patient discussed with Dr. Evette Doffing

## 2016-01-30 NOTE — Patient Instructions (Signed)
For the gout, please try taking ibuprofen 433m every 12 hours as needed for the next 3 days.   If no better, please give [place] call back.  Thank you.

## 2016-02-01 NOTE — Progress Notes (Signed)
Internal Medicine Clinic Attending  Case discussed with Dr. Patel,Rushil at the time of the visit.  We reviewed the resident's history and exam and pertinent patient test results.  I agree with the assessment, diagnosis, and plan of care documented in the resident's note.  

## 2016-02-02 ENCOUNTER — Encounter: Payer: Self-pay | Admitting: Internal Medicine

## 2016-02-02 ENCOUNTER — Ambulatory Visit (INDEPENDENT_AMBULATORY_CARE_PROVIDER_SITE_OTHER): Payer: Medicare Other | Admitting: Internal Medicine

## 2016-02-02 VITALS — BP 133/50 | HR 62 | Temp 98.3°F | Ht 66.0 in | Wt 210.6 lb

## 2016-02-02 DIAGNOSIS — M10071 Idiopathic gout, right ankle and foot: Secondary | ICD-10-CM

## 2016-02-02 DIAGNOSIS — Z87891 Personal history of nicotine dependence: Secondary | ICD-10-CM

## 2016-02-02 DIAGNOSIS — M1A0720 Idiopathic chronic gout, left ankle and foot, without tophus (tophi): Secondary | ICD-10-CM

## 2016-02-02 NOTE — Progress Notes (Signed)
Internal Medicine Clinic Attending  I saw and evaluated the patient.  I personally confirmed the key portions of the history and exam documented by Dr. Charlott Rakes and I reviewed pertinent patient test results.  The assessment, diagnosis, and plan were formulated together and I agree with the documentation in the resident's note.

## 2016-02-02 NOTE — Progress Notes (Addendum)
CC: right foot gout  HPI:  Dylan [name] is a 70 y.o. male who presents today for right foot gout. Please see assessment & plan for status of chronic medical problems.   Past Medical History:  Diagnosis Date  . Anemia 10/18/2014   Baseline about 12 and stable from 2010 to 2016. Colon 2009 in [place] (records cannot be obtained).  EGD Dr Benson Norway 2011 nl   . Chronic diastolic heart failure ([place]) 10/18/2014   Noted ECHO 10/2014. Grade 2. EF 50-55%   . Chronic renal failure, stage 3 (moderate) 10/18/2014   Baseline Cr about 1.5. Stable from 2010 to 2016.   Marland Kitchen Coronary artery disease   . Diverticulosis 10/08/2014   Seen on CT. Reportedly on Colon in [place] in 2009. Freq bouts of diverticulitis.  . DM (diabetes mellitus), type 2 with renal complications ([place]) 08/09/252  . Essential hypertension 10/09/2014   Poor control with 5 drug therapy.   . Former tobacco use 10/08/2014  . OSA on CPAP 10/08/2014  . Pulmonary HTN ([place]) 10/18/2014   Noted as severe ECHO 2016. Likely 2/2 severe OSA.    Review of Systems:  Please see each problem below for a pertinent review of systems.   Physical Exam:  Vitals:   02/02/16 1102  BP: (!) 133/50  Pulse: 62  Temp: 98.3 F (36.8 C)  TempSrc: Oral  SpO2: 99%  Weight: 210 lb 9.6 oz (95.5 kg)  Height: 5' 6" (1.676 m)   Constitutional: Elderly African-American male. No distress.  Cardiovascular: 1+ dorsalis pedis pulse on the right foot as compared to 2+ on the left foot. Musculoskeletal: Improved tenderness noted overlying the first MTP joint of the right foot. Swelling of the dorsum of the right foot as compared to left, stable from last visit. Range of motion improved as evidenced by the absence of grimacing with dorsiflexion/plantar flexion and wiggling of his toes.  Skin: No erythema appreciated overlying the right foot dorsum or right lower extremity.    Assessment & Plan:   See Encounters Tab for problem based charting.  Gout Assessment Since his last office  visit, pain on his taking ibuprofen 400 mg twice daily on the first day after he left but then dropped down to 200 mg twice daily. He reports following up with vascular surgery who also agreed with the initial diagnosis of gout and attributed the swelling to venous insufficiency; I do not see their note in the system. Overall, he has had marked improvement in his pain and range of motion. He denies any adverse effects of NSAID therapy, like dyspepsia, nausea, vomiting.  His physical exam findings show improvement in his signs as compared to several days ago, so I feel confident calling this acute gouty monoarthritis.   Plan -Continue ibuprofen 200-400 mg twice daily to complete therapy on 02/05/16. Advised him to take with meals if possible. -Educated the patient and counseled him on lifestyle modifications with regards to dietary changes: limiting red meat and alcohol intake. -Per discussions with his PCP Dr. 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    Patient discussed with Dr. Lynnae January

## 2016-02-02 NOTE — Assessment & Plan Note (Addendum)
Assessment Since his last office visit, pain on his taking ibuprofen 400 mg twice daily on the first day after he left but then dropped down to 200 mg twice daily. He reports following up with vascular surgery who also agreed with the initial diagnosis of gout and attributed the swelling to venous insufficiency; I do not see their note in the system. Overall, he has had marked improvement in his pain and range of motion. He denies any adverse effects of NSAID therapy, like dyspepsia, nausea, vomiting.  His physical exam findings show improvement in his signs as compared to several days ago, so I feel confident calling this acute gouty monoarthritis.   Plan -Continue ibuprofen 200-400 mg twice daily to complete therapy on 02/05/16. Advised him to take with meals if possible. -Educated the patient and counseled him on lifestyle modifications with regards to dietary changes: limiting red meat and alcohol intake. -Per discussions with his PCP Dr. 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

## 2016-02-02 NOTE — Patient Instructions (Addendum)
Please continue taking ibuprofen 1-2 tablets twice daily through Monday.   Let's have you come back to be seen by Dr. Lynnae January in 1 month.

## 2016-02-07 ENCOUNTER — Emergency Department (HOSPITAL_COMMUNITY)
Admission: EM | Admit: 2016-02-07 | Discharge: 2016-02-07 | Disposition: A | Discharge status: Home / Self Care | Payer: Medicare Other | Attending: Emergency Medicine | Admitting: Emergency Medicine

## 2016-02-07 ENCOUNTER — Encounter (HOSPITAL_COMMUNITY): Payer: Self-pay | Admitting: *Deleted

## 2016-02-07 ENCOUNTER — Telehealth: Payer: Self-pay | Admitting: *Deleted

## 2016-02-07 ENCOUNTER — Encounter: Payer: Self-pay | Admitting: Internal Medicine

## 2016-02-07 ENCOUNTER — Ambulatory Visit: Payer: Medicare Other

## 2016-02-07 ENCOUNTER — Emergency Department (HOSPITAL_COMMUNITY): Payer: Medicare Other

## 2016-02-07 DIAGNOSIS — Z7984 Long term (current) use of oral hypoglycemic drugs: Secondary | ICD-10-CM | POA: Insufficient documentation

## 2016-02-07 DIAGNOSIS — Z7982 Long term (current) use of aspirin: Secondary | ICD-10-CM | POA: Diagnosis not present

## 2016-02-07 DIAGNOSIS — E11319 Type 2 diabetes mellitus with unspecified diabetic retinopathy without macular edema: Secondary | ICD-10-CM | POA: Insufficient documentation

## 2016-02-07 DIAGNOSIS — Z794 Long term (current) use of insulin: Secondary | ICD-10-CM | POA: Insufficient documentation

## 2016-02-07 DIAGNOSIS — Z79899 Other long term (current) drug therapy: Secondary | ICD-10-CM | POA: Insufficient documentation

## 2016-02-07 DIAGNOSIS — I2510 Atherosclerotic heart disease of native coronary artery without angina pectoris: Secondary | ICD-10-CM | POA: Insufficient documentation

## 2016-02-07 DIAGNOSIS — M79671 Pain in right foot: Secondary | ICD-10-CM | POA: Diagnosis not present

## 2016-02-07 DIAGNOSIS — I110 Hypertensive heart disease with heart failure: Secondary | ICD-10-CM | POA: Insufficient documentation

## 2016-02-07 DIAGNOSIS — I5032 Chronic diastolic (congestive) heart failure: Secondary | ICD-10-CM | POA: Insufficient documentation

## 2016-02-07 DIAGNOSIS — Z87891 Personal history of nicotine dependence: Secondary | ICD-10-CM | POA: Insufficient documentation

## 2016-02-07 MED ORDER — HYDROCODONE-ACETAMINOPHEN 5-325 MG PO TABS: 1.0000 | ORAL_TABLET | Freq: Four times a day (QID) | ORAL | 0 refills | Status: DC | PRN

## 2016-02-07 MED ORDER — HYDROCODONE-ACETAMINOPHEN 5-325 MG PO TABS
1.0000 | ORAL_TABLET | Freq: Once | ORAL | Status: AC
  Administered 2016-02-07: 1 via ORAL
  Filled 2016-02-07: qty 1

## 2016-02-07 NOTE — Telephone Encounter (Signed)
Call from pt's daughter, Dylan [name] - who is very upset. States her father has been seen x 2 recently and nothing has been done for his foot. States bleeding from his toe; and foot now is worse, "swollen and he's in pain"; "he can barely walk".  Offer an appt ; but states if she should take him to the ED since we keep sending him home and nothing is helping. Thanks

## 2016-02-07 NOTE — ED Triage Notes (Signed)
Pt reports right foot swelling for over a year, has been seen at pcp twice recently due to pain and swelling and given ibuprofen and dc home. Pt reports this am having bleeding from right big toe. No wounds noted, moderate swelling noted. Denies fever.

## 2016-02-07 NOTE — ED Notes (Signed)
Pt. Transported to xray at this time.

## 2016-02-07 NOTE — ED Notes (Signed)
Patient transported to X-ray 

## 2016-02-07 NOTE — Telephone Encounter (Signed)
Dr Zenovia Jarred message relayed to pt's daughter, [name]. Upon looking at appointments, pt had an appt made this am for 2:45PM w/ [facility]; daughter unawared (so was I) and stated will not be able to make this time. Next available appt is 3:15PM - will not be able to get here in time either. Daughter decided she will bring pt to the ED.

## 2016-02-07 NOTE — ED Provider Notes (Signed)
[place] DEPT Provider Note   CSN: [id number] Arrival date & time: 02/07/16  1533  First Provider Contact:  None       History   Chief Complaint Chief Complaint  Patient presents with  . Foot Pain    HPI Tajay Muzzy is a 70 y.o. male.  HPI   Pt has R foot swelling x 1 year and R great toe pain x 3 weeks.  Have been evaluated by PCP and vascular specialist and was determined that he has chronic venus insufficiency and gout.  Pt is on a diuretic and also have been taking ibkuprofen daily for gout pain but sts it has not help.  Was using Colchicine but no improvement.  Report some bleeding under toe nail several weeks ago but it has resolved.  Denies recent injury.  Have not check CBG daily for several months due to ran out of his glucose strip. Currently complaining of sharp achy pain, 10 out of 10, persistent, worsening with movement. No associated fever, new numbness, or rash. No calf or knee pain.  Past Medical History:  Diagnosis Date  . Anemia 10/18/2014   Baseline about 12 and stable from 2010 to 2016. Colon 2009 in [place] (records cannot be obtained).  EGD Dr Benson Norway 2011 nl   . Chronic diastolic heart failure ([place]) 10/18/2014   Noted ECHO 10/2014. Grade 2. EF 50-55%   . Chronic renal failure, stage 3 (moderate) 10/18/2014   Baseline Cr about 1.5. Stable from 2010 to 2016.   Marland Kitchen Coronary artery disease   . Diverticulosis 10/08/2014   Seen on CT. Reportedly on Colon in [place] in 2009. Freq bouts of diverticulitis.  . DM (diabetes mellitus), type 2 with renal complications ([place]) 11/11/1535  . Essential hypertension 10/09/2014   Poor control with 5 drug therapy.   . Former tobacco use 10/08/2014  . OSA on CPAP 10/08/2014  . Pulmonary HTN ([place]) 10/18/2014   Noted as severe ECHO 2016. Likely 2/2 severe OSA.    Patient Active Problem List   Diagnosis Date Noted  . Allergic rhinitis 06/15/2015  . 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 Atherosclerosis of native arteries of extremity with intermittent  claudication ([place]) 12/28/2014  . Healthcare maintenance 12/12/2014  . Diabetic retinopathy ([place]) 11/08/2014  . Gout 10/19/2014  . Stage 3 chronic renal impairment associated with type 2 diabetes mellitus ([place]) 10/18/2014  . Anemia 10/18/2014  . Obesity 10/18/2014  . Pulmonary HTN ([place]) 10/18/2014  . Chronic diastolic heart failure ([place]) 10/18/2014  . Hypertension associated with diabetes ([place]) 10/09/2014  . OSA treated with BiPAP 10/08/2014  . Former tobacco use 10/08/2014  . Diverticulosis 10/08/2014  . DM (diabetes mellitus), type 2 with renal complications ([place]) 94/32/7614    Past Surgical History:  Procedure Laterality Date  . CHOLECYSTECTOMY         Home Medications    Prior to Admission medications   Medication Sig Start Date End Date Taking? Authorizing Provider  albuterol (PROVENTIL HFA;VENTOLIN HFA) 108 (90 BASE) MCG/ACT inhaler Inhale 1-2 puffs into the lungs every 6 (six) hours as needed for wheezing or shortness of breath. 12/08/14   Bartholomew Crews, MD  alum & mag hydroxide-simeth (MAALOX/MYLANTA) 200-200-20 MG/5ML suspension Take 30 mLs by mouth every 6 (six) hours as needed for indigestion or heartburn (dyspepsia). 10/13/14   Maryann Mikhail, DO  aspirin 81 MG chewable tablet Chew 1 tablet (81 mg total) by mouth daily. 06/15/15   Riccardo Dubin, MD  colchicine 0.6 MG tablet Take  2 pills at onset of gout flare then one pill one hour later. Then one pill daily until gout gone 11/30/15 11/30/19  Bartholomew Crews, MD  docusate sodium (COLACE) 100 MG capsule Take 1 capsule (100 mg total) by mouth every 12 (twelve) hours. 08/05/15   Charlesetta Shanks, MD  eplerenone (INSPRA) 50 MG tablet TAKE ONE TABLET BY MOUTH ONCE DAILY 12/18/15   Bartholomew Crews, MD  fluticasone [facility] [place]) 50 MCG/ACT nasal spray Place 1-2 sprays into both nostrils daily. 06/15/15   Riccardo Dubin, MD  glipiZIDE (GLUCOTROL) 5 MG tablet Take 2 tablets (10 mg total) by mouth 2 (two) times daily before a  meal. 04/20/15   Bartholomew Crews, MD  hydrALAZINE (APRESOLINE) 50 MG tablet Take 1 tablet (50 mg total) by mouth every 8 (eight) hours. 07/18/15   Milagros Loll, MD  ibuprofen (ADVIL) 200 MG tablet Take 2 tablets (400 mg total) by mouth every 12 (twelve) hours as needed. 01/30/16 01/29/17  Riccardo Dubin, MD  Insulin Detemir (LEVEMIR) 100 UNIT/ML Pen Inject 20 Units into the skin daily at 10 pm. 12/08/14   Bartholomew Crews, MD  ketotifen (ZADITOR) 0.025 % ophthalmic solution Place 1 drop into both eyes 2 (two) times daily.    Historical Provider, MD  labetalol (NORMODYNE) 200 MG tablet TAKE TWO TABLETS BY MOUTH TWICE DAILY 12/27/15   Bartholomew Crews, MD  minoxidil (LONITEN) 10 MG tablet Take 2 tablets (20 mg total) by mouth daily. 09/28/15   Bartholomew Crews, MD  oxyCODONE-acetaminophen (PERCOCET/ROXICET) 5-325 MG tablet Take 1 tablet by mouth every 4 (four) hours as needed for severe pain. 12/01/15   Isla Pence, MD  pravastatin (PRAVACHOL) 40 MG tablet Take 1 tablet (40 mg total) by mouth every evening. 01/12/16 01/11/17  Bartholomew Crews, MD  torsemide (DEMADEX) 10 MG tablet Take 2 in morning and 1 in afternoon 09/28/15   Bartholomew Crews, MD  verapamil (VERELAN PM) 360 MG 24 hr capsule TAKE ONE CAPSULE BY MOUTH ONCE DAILY 01/18/16   Bartholomew Crews, MD    Family History History reviewed. No pertinent family history.  Social History Social History  Substance Use Topics  . Smoking status: Former Smoker    Quit date: 10/02/1972  . Smokeless tobacco: Never Used  . Alcohol use No     Allergies   Spironolactone   Review of Systems Review of Systems  Constitutional: Negative for fever.  Musculoskeletal: Positive for arthralgias.  Neurological: Negative for numbness.     Physical Exam Updated Vital Signs BP 169/66   Pulse 67   Temp 99 F (37.2 C) (Oral)   Resp 16   Ht _0  (1.676 m)   Wt 93.4 kg   SpO2 95%   BMI 33.25 kg/m   Physical Exam    Constitutional: He appears well-developed and well-nourished. No distress.  HENT:  Head: Atraumatic.  Eyes: Conjunctivae are normal.  Neck: Normal range of motion. Neck supple.  Musculoskeletal: He exhibits tenderness (Right foot: Tenderness to first great toe most significant to the MTP. Mild edema noted to the dorsum of foot.  intact DP pulse.  No erythema, no abscess. Nails normal).  Neurological: He is alert.  Skin: No rash noted.  Psychiatric: He has a normal mood and affect.     ED Treatments / Results  Labs (all labs ordered are listed, but only abnormal results are displayed) Labs Reviewed - No data to display  EKG  EKG Interpretation None  Radiology Dg Foot Complete Right  Result Date: 02/07/2016 CLINICAL DATA:  Right foot pain for 1 year. EXAM: RIGHT FOOT COMPLETE - 3+ VIEW COMPARISON:  Radiographs of January 26, 2012. FINDINGS: There is no evidence of fracture or dislocation. There is no evidence of arthropathy. Vascular calcifications are noted. Minimal spurring of posterior calcaneus is noted. IMPRESSION: No acute abnormality seen in the right foot. Electronically Signed   By: Marijo Conception, M.D.   On: 02/07/2016 20:59    Procedures Procedures (including critical care time)  Medications Ordered in ED Medications  HYDROcodone-acetaminophen (NORCO/VICODIN) 5-325 MG per tablet 1 tablet (1 tablet Oral Given 02/07/16 2104)     Initial Impression / Assessment and Plan / ED Course  I have reviewed the triage vital signs and the nursing notes.  Pertinent labs & imaging results that were available during my care of the patient were reviewed by me and considered in my medical decision making (see chart for details).  Clinical Course     BP 160/59   Pulse 62   Temp 99 F (37.2 C) (Oral)   Resp 16   Ht _0  (1.676 m)   Wt 93.4 kg   SpO2 93%   BMI 33.25 kg/m    Final Clinical Impressions(s) / ED Diagnoses   Final diagnoses:  Foot pain, right    New  Prescriptions New Prescriptions   HYDROCODONE-ACETAMINOPHEN (NORCO/VICODIN) 5-325 MG TABLET    Take 1 tablet by mouth every 6 (six) hours as needed for moderate pain.   7:58 PM Patient here with acute on chronic right foot pain, history of gout and history of chronic venous insufficiency. Pain is chronic in nature. No evidence to suggest active infection. No gross deformity or injury concerning for fracture or dislocation. Patient report pain with ambulation but able to ambulate. X-ray ordered, pain medication given. Patient is neurovascularly intact.  9:12 PM X-ray of right foot shows no acute pathology. I discuss finding with Dr. Tanna Furry. Since patient has significant comorbidity, I do not think NSAIDs or steroids indicated at this time. However, I will provide patient a short course of narcotic pain medication for his right foot pain which I suspect is likely due to gout. He will need to follow with his PCP for further management of his condition. I encouraged patient to also discuss refill of his insulin strep by his PCP and to monitor his diabetes closely. Return precaution discussed. No evidence to suggest diabetic foot ulcer and I have low suspicion for DVT.   Domenic Moras, PA-C 02/07/16 2129    Tanna Furry, MD 02/20/16 [phone]

## 2016-02-07 NOTE — Telephone Encounter (Signed)
I saw him with Dr Posey Pronto last appt. Pt had stated foot a lot better. If it is now worse and bleeding needs to see an MD [facility] ED.

## 2016-02-07 NOTE — Telephone Encounter (Signed)
Pt daughter is calling back requesting to speak with a nurse. Please call back.

## 2016-02-13 ENCOUNTER — Telehealth: Payer: Self-pay | Admitting: Internal Medicine

## 2016-02-13 DIAGNOSIS — M79671 Pain in right foot: Secondary | ICD-10-CM

## 2016-02-13 NOTE — Telephone Encounter (Signed)
Patient has been sch on 02/21/2016 @ 11:30am with the [facility] with Dr. Paulla Dolly.  Spoke with the patient and notified him of this appointment.

## 2016-02-13 NOTE — Telephone Encounter (Signed)
I strongly suggest an IM appt. Initial IM appt and ED appt stated only R foot pain. Now note indicates both feet are involved. Since there has been a change, I would recommend an eval. Since daughter is unwilling, I will put in referral to podiatry. However, there are many causes of pain that podiatry will not be able to treat. If pt's insurance requires pre-auth for this referral, then he will have to be seen in [facility]. I cannot guarantee timeliness of podiatry appt. Again, I rec [facility] appt.

## 2016-02-13 NOTE — Telephone Encounter (Signed)
Rec'd phone call from patient's daughter [name]) stating that she would  like an Urgent Ref to a podiatrist for the patient.  Patients feet are no better and are swollen and he can not walk.  Appt was offered and refused. Please advise.

## 2016-02-14 ENCOUNTER — Encounter: Payer: Self-pay | Admitting: Internal Medicine

## 2016-02-14 ENCOUNTER — Ambulatory Visit (INDEPENDENT_AMBULATORY_CARE_PROVIDER_SITE_OTHER): Payer: Medicare Other | Admitting: Internal Medicine

## 2016-02-14 ENCOUNTER — Telehealth: Payer: Self-pay | Admitting: Dietician

## 2016-02-14 VITALS — BP 132/41 | HR 71 | Temp 99.7°F | Ht 66.0 in | Wt 203.3 lb

## 2016-02-14 DIAGNOSIS — M10072 Idiopathic gout, left ankle and foot: Secondary | ICD-10-CM | POA: Diagnosis not present

## 2016-02-14 DIAGNOSIS — N183 Chronic kidney disease, stage 3 unspecified: Secondary | ICD-10-CM

## 2016-02-14 DIAGNOSIS — Z87891 Personal history of nicotine dependence: Secondary | ICD-10-CM | POA: Diagnosis not present

## 2016-02-14 DIAGNOSIS — N289 Disorder of kidney and ureter, unspecified: Secondary | ICD-10-CM

## 2016-02-14 DIAGNOSIS — Z794 Long term (current) use of insulin: Secondary | ICD-10-CM | POA: Diagnosis not present

## 2016-02-14 DIAGNOSIS — M25472 Effusion, left ankle: Secondary | ICD-10-CM

## 2016-02-14 DIAGNOSIS — E11649 Type 2 diabetes mellitus with hypoglycemia without coma: Secondary | ICD-10-CM

## 2016-02-14 DIAGNOSIS — E1122 Type 2 diabetes mellitus with diabetic chronic kidney disease: Secondary | ICD-10-CM

## 2016-02-14 DIAGNOSIS — E1129 Type 2 diabetes mellitus with other diabetic kidney complication: Secondary | ICD-10-CM

## 2016-02-14 DIAGNOSIS — M1000 Idiopathic gout, unspecified site: Secondary | ICD-10-CM

## 2016-02-14 LAB — SYNOVIAL CELL COUNT + DIFF, W/ CRYSTALS
Crystals, Fluid: NONE SEEN
Eosinophils-Synovial: 0 % (ref 0–1)
Lymphocytes-Synovial Fld: 2 % (ref 0–20)
Monocyte-Macrophage-Synovial Fluid: 6 % — ABNORMAL LOW (ref 50–90)
Neutrophil, Synovial: 92 % — ABNORMAL HIGH (ref 0–25)
WBC, Synovial: 690 /mm3 — ABNORMAL HIGH (ref 0–200)

## 2016-02-14 LAB — GLUCOSE, CAPILLARY
Glucose-Capillary: 54 mg/dL — ABNORMAL LOW (ref 65–99)
Glucose-Capillary: 70 mg/dL (ref 65–99)

## 2016-02-14 MED ORDER — METHYLPREDNISOLONE 4 MG PO TBPK: ORAL_TABLET | ORAL | 0 refills | Status: DC

## 2016-02-14 MED ORDER — FREESTYLE LITE TEST VI STRP: ORAL_STRIP | 12 refills | Status: DC

## 2016-02-14 MED ORDER — COLCHICINE 0.6 MG PO TABS: ORAL_TABLET | ORAL | 2 refills | Status: DC

## 2016-02-14 MED ORDER — GLUCOSE BLOOD VI STRP: ORAL_STRIP | 12 refills | Status: DC

## 2016-02-14 NOTE — Progress Notes (Signed)
Hypoglycemic Event  CBG: 54  Treatment: 3 glucose tabs  Symptoms: None  Follow-up CBG: Time: 9:40 AM CBG Result: 70  Possible Reasons for Event: Inadequate meal intake  Comments/MD notified:Dr. Rozell Searing, Trey Sailors

## 2016-02-14 NOTE — Patient Instructions (Addendum)
Dylan [name] Please take Medrol dose pack for your gout flare  Day 1= 23m - 5 pills Day 2 = 185m- 4 pills Day 3 = 164m 4 pills Day 4 = 77m101m3 pills Day5 = 8mg 87m pills Day6 = 8mg -60mpills Day7 = 4mg - 62mll  And Please take colchicine 2 times daily  Please follow up with your PCP in 1 month

## 2016-02-14 NOTE — Progress Notes (Signed)
   CC: Foot pain   HPI:  Mr.Ascencion Whistler is a 70 y.o. male with PMHx of HTN, DM type II, gout, Diastolic congestive heart failure, renal disease and peripheral artery disease that presents to [facility] for foot pain that has progressively gotten worse over the past 2 weeks.  Associated symptoms include swelling.  He denies numbness, burning or tingling in his lower extremities. It is worse when he walks and he has tried ibuprofen with little benefit. He was previously seen in the clinic for the same symptoms and treated for acute gouty monoarthritis.  He saw vascular surgery 01/01/16 and ABIs showed severely decreased arterial profusion on right lower extremity and moderately decreased profusion on the left lower extremity.  These are unchanged from previous ABI on 08/11/15.  Last echo 10/11/14 LV EF 50-55% with grade 2 diastolic dysfunction.  Weight has remained stable.           Past Medical History:  Diagnosis Date  . Anemia 10/18/2014   Baseline about 12 and stable from 2010 to 2016. Colon 2009 in [place] (records cannot be obtained).  EGD Dr Benson Norway 2011 nl   . Chronic diastolic heart failure ([place]) 10/18/2014   Noted ECHO 10/2014. Grade 2. EF 50-55%   . Chronic renal failure, stage 3 (moderate) 10/18/2014   Baseline Cr about 1.5. Stable from 2010 to 2016.   Marland Kitchen Coronary artery disease   . Diverticulosis 10/08/2014   Seen on CT. Reportedly on Colon in [place] in 2009. Freq bouts of diverticulitis.  . DM (diabetes mellitus), type 2 with renal complications ([place]) 11/12/9726  . Essential hypertension 10/09/2014   Poor control with 5 drug therapy.   . Former tobacco use 10/08/2014  . OSA on CPAP 10/08/2014  . Pulmonary HTN ([place]) 10/18/2014   Noted as severe ECHO 2016. Likely 2/2 severe OSA.    Review of Systems:  Review of Systems  Eyes: Negative for blurred vision.  Respiratory: Positive for shortness of breath.   Cardiovascular: Negative for chest pain.  Gastrointestinal: Negative for abdominal pain.  Neurological: Negative  for dizziness.     Physical Exam:  Vitals:   02/14/16 0828  BP: (!) 132/41  Pulse: 71  Temp: 99.7 F (37.6 C)  TempSrc: Oral  SpO2: 94%  Weight: 203 lb 4.8 oz (92.2 kg)  Height: _0  (1.676 m)   Physical Exam  Cardiovascular: Normal rate and regular rhythm.   Pulmonary/Chest: Effort normal and breath sounds normal.  Abdominal: Soft. There is no tenderness.  Musculoskeletal:  Feet swelling bilaterally below ankle Feet are warm and dry, no ulcerations noted Sensation intact 5/5 motor strength bilaterally in lower extremities  Ankle tenderness noted on active and passive range of motion of left ankle joint Effusion noted near medial malleolus of left ankle   Skin: Skin is warm.    Assessment & Plan:   See encounters tab for problem based medical decision making.   Patient seen with Dr. Beryle Beams

## 2016-02-14 NOTE — Progress Notes (Signed)
Procedure Note:  After consent was obtained, using sterile technique the left ankle was prepped and plain Lidocaine 1% was used as local anesthetic. The joint was entered and 1.5 ml's of blood tinged yellow colored fluid was withdrawn and sent for cell count and crystal analysis.  Triamcinolone 40 mg and 1 ml plain Lidocaine was then injected and the needle withdrawn.  The procedure was well tolerated. Ultrasound was used in this procedure.  The patient is asked to continue to rest the joint for a few more days before resuming regular activities.  It may be more painful for the first 1-2 days.  Watch for fever, or increased swelling or persistent pain in the joint. Call or return to clinic prn if such symptoms occur or there is failure to improve as anticipated.

## 2016-02-14 NOTE — Progress Notes (Signed)
Medicine attending: I personally interviewed and briefly examined this patient on the day of the patient visit and reviewed pertinent clinical and ,laboratory data  with resident physician Dr. Kalman Shan and we discussed a management plan. He is having a severe gout flare affecting his left ankle. He has a palpable effusion over the lateral malleolus and soft tissue swelling of the dorsum of his foot. He had no response to recent NSAID plus colchicine. We asked one of our colleagues, Dr Evette Doffing, to do a joint aspiration and steroid injection. We will prescribe a solumedrol dose pack and put him back on colchicine. Short term follow up.

## 2016-02-14 NOTE — Assessment & Plan Note (Addendum)
Assessment: left ankle gout On exam ankle joint was painful on active and passive range of motion.   Plan -7 day medrol dosepak - colchicine 0.6 mg BID - In office ankle joint injection.  Fluid sent for cell count, crystal and culture

## 2016-02-14 NOTE — Telephone Encounter (Addendum)
Steroid-Induced Hyperglycemia Prevention and Management Dylan [name] is a 70 y.o. male who meets criteria for [facility] quality improvement program (diabetes patient prescribed short course of steroids).  A/P Current Regimen  Patient prescribed prednisone *Day 1 = 10m - 5 pills  Day 2 = 135m- 4 pills  Day 3 = 1644m 4 pills  Day 4 = 57m78m3 pills  Day 5 = 8mg 87m pills  Day 6 = 8mg -60mpills  Day 7 = 4mg - 91mll   Currently on day 0 of therapy. Patient taking prednisone in the AM  Prednisone indication gout  Current DM regimen glipizide 10 mg twice daily & levemir 20 units at bedtime daily  DM regimen prior to steroid course same  Home BG Monitoring  Patient does have a meter at home and does not check BG at home. Meter was not supplied.  CBGs at home he has not been checking lately  CBGs prior to steroid course 54-70, A1C prior to steroid course 6.6  S/Sx of hyper- or hypoglycemia: none, none  Medication Management  Switch prednisone dose to AM yes  Patient Education  Advised patient to monitor BG while on steroid therapy (at least twice daily prior to first 2 meals of the day).  Patient educated about signs/symptoms and advised to contact clinic if hyper- or hypoglycemic.  Patient did verbalize understanding of information and regimen by repeating back topics discussed.  Follow-up daily  03/07/16  Dylan [name] 5[name] 02/14/2016    Called [facility]- they cannot fill his strip prescription because they need a Frequency and  his Medicare B  Freestyle Lite. Request new prescription be sent

## 2016-02-14 NOTE — Assessment & Plan Note (Addendum)
Assessment:  Diabetes Mellitus type 2 Patient CBG was 54 in office  Plan - gave in office Glutose 15  - Given graham crackers  - CBG was 70 before leaving office

## 2016-02-15 ENCOUNTER — Other Ambulatory Visit: Payer: Self-pay | Admitting: Internal Medicine

## 2016-02-15 DIAGNOSIS — E1122 Type 2 diabetes mellitus with diabetic chronic kidney disease: Secondary | ICD-10-CM

## 2016-02-15 NOTE — Telephone Encounter (Signed)
Steroid Induced Hyperglycemia Prevention and Management-   A/P:  patient reports today is Day 2 of his steroids. Took the steroids about 10 Am today. He has not gotten his test strips yet, so no blood sugars to report. His daughter is in route to pick them up now. Informed him that if 2 CBGs are more than 200 then we'd move his levemir to the morning and consider increasing it temporarily.   Follow up- daily while on steroids and throughout his transition off steroids and with Dr. Lynnae January 03/07/16.

## 2016-02-16 NOTE — Telephone Encounter (Addendum)
Steroid-Induced Hyperglycemia Prevention and Management    A/P Current Regimen  Patient prescribed prednisone for 7 days, decreasing doses as described in previous note.  currently on day ? 3 of therapy. (16 mg prednisone) Patient taking prednisone in the AM . He says he started off with 6 pillls, today was 4 pills, tomorrow is 3 pulls and sunday is 2 pills and his last pill is Monday.    Home BG Monitoring   CBGs at home-no answer first call, Spoke with him on second attempt: CBGs 337 at 6:20 PM and 220 yesterday, 177 fasting today, 216 while on the phone at 4:50 PM. He reports that he is walking\gout better. left foot better, right foot still swollen.   Called [facility] to be sure he was able to pick up his strips. He picked up levemir, pen needles freestyle lite 100 strips  (the strips cost him $3.30)   S/Sx of hyper- or hypoglycemia: none, none  Medication Management  Switch prednisone dose to AM yes  Increase basal insulin Levemir to 24 units and  switch to morning dosing  Patient instructed to take 12 units tonight, 12 units saturday am and 24 units Sunday am. He was told he can take 24 units again on monday or call  and speak to our pharmacist.   Physician preference level per protocol: 1  Medication supply Patient will use own medication  Patient Education  Advised patient to monitor BG while on steroid therapy (at least twice daily prior to first 2 meals of the day).  Patient educated about signs/symptoms and advised to contact clinic if hyper- or hypoglycemic.  Patient did  verbalize understanding of information and regimen by repeating back topics discussed.  Lisette Mancebo, [name] 5:08 PM 02/16/2016  Follow-up monday by pharmacists  03/07/2016 with Dr. Lynnae January

## 2016-02-17 LAB — BODY FLUID CULTURE: Culture: NO GROWTH

## 2016-02-21 ENCOUNTER — Ambulatory Visit: Payer: Medicare Other | Admitting: Podiatry

## 2016-02-21 NOTE — Telephone Encounter (Signed)
Unable to reach x 3, no voicemail available

## 2016-03-07 ENCOUNTER — Encounter: Payer: Self-pay | Admitting: Internal Medicine

## 2016-03-07 ENCOUNTER — Ambulatory Visit (INDEPENDENT_AMBULATORY_CARE_PROVIDER_SITE_OTHER): Payer: Medicare Other | Admitting: Internal Medicine

## 2016-03-07 DIAGNOSIS — E1122 Type 2 diabetes mellitus with diabetic chronic kidney disease: Secondary | ICD-10-CM

## 2016-03-07 DIAGNOSIS — G4733 Obstructive sleep apnea (adult) (pediatric): Secondary | ICD-10-CM

## 2016-03-07 DIAGNOSIS — I70213 Atherosclerosis of native arteries of extremities with intermittent claudication, bilateral legs: Secondary | ICD-10-CM | POA: Diagnosis not present

## 2016-03-07 DIAGNOSIS — Z23 Encounter for immunization: Secondary | ICD-10-CM

## 2016-03-07 DIAGNOSIS — M7989 Other specified soft tissue disorders: Secondary | ICD-10-CM | POA: Diagnosis not present

## 2016-03-07 DIAGNOSIS — M79672 Pain in left foot: Secondary | ICD-10-CM

## 2016-03-07 DIAGNOSIS — M79671 Pain in right foot: Secondary | ICD-10-CM | POA: Diagnosis not present

## 2016-03-07 DIAGNOSIS — Z87891 Personal history of nicotine dependence: Secondary | ICD-10-CM | POA: Diagnosis not present

## 2016-03-07 DIAGNOSIS — M1000 Idiopathic gout, unspecified site: Secondary | ICD-10-CM

## 2016-03-07 DIAGNOSIS — M1A9XX0 Chronic gout, unspecified, without tophus (tophi): Secondary | ICD-10-CM | POA: Diagnosis not present

## 2016-03-07 DIAGNOSIS — N183 Chronic kidney disease, stage 3 unspecified: Secondary | ICD-10-CM

## 2016-03-07 DIAGNOSIS — I152 Hypertension secondary to endocrine disorders: Secondary | ICD-10-CM

## 2016-03-07 DIAGNOSIS — E1159 Type 2 diabetes mellitus with other circulatory complications: Secondary | ICD-10-CM

## 2016-03-07 DIAGNOSIS — Z794 Long term (current) use of insulin: Secondary | ICD-10-CM

## 2016-03-07 DIAGNOSIS — E1121 Type 2 diabetes mellitus with diabetic nephropathy: Secondary | ICD-10-CM

## 2016-03-07 DIAGNOSIS — Z79899 Other long term (current) drug therapy: Secondary | ICD-10-CM | POA: Diagnosis not present

## 2016-03-07 DIAGNOSIS — I10 Essential (primary) hypertension: Secondary | ICD-10-CM

## 2016-03-07 MED ORDER — COLCHICINE 0.6 MG PO TABS: 0.6000 mg | ORAL_TABLET | Freq: Two times a day (BID) | ORAL | 0 refills | Status: DC

## 2016-03-07 NOTE — Assessment & Plan Note (Signed)
BP Readings from Last 3 Encounters:  03/07/16 (!) 159/59  02/14/16 (!) 132/41  02/07/16 160/59   His regimen is eplerone 50 QD, hydral 50 Q8 hrs, Labetalol 400 BID, verapamil 360 QD, minoxidil 20 QD, Demadex 20 in AM, 10 in PM. All meds at max or standard dosing. Could push the hydral a bit if still high nest appt.  PLAN:  Cont current meds BP check 4 weeks

## 2016-03-07 NOTE — Assessment & Plan Note (Signed)
Will be seeing Dr Justin Mend soon. Cr basically stable with GFR about 40's. Asked him not to take chronic ibuprofen as he had been Rx'd this for gout flare and given #100 tabs.  PLAN : stop ibuprofen

## 2016-03-07 NOTE — Assessment & Plan Note (Addendum)
I first saw Mr Dylan [name] in April 2016 and he described prior episodes of podagra but had never had a crystal diagnosis. Uric acid was 7.1. He was not interested in prophylaxis. Gout stable until June  - gout flare which resolved after 7 days and colchicine treatment.  Since July 2017, he has had 4 outpatient appts and 2 ED appointments for worsening bilateral foot pain and edema. These were felt to be gout flares and were treated with scheduled ibuprofen and subsequently a 7 day steroid taper. He had an arthrocentesis of the left ankle that did not show crystals. He also had a plain film of the R foot in August that was normal.  Today, he and his son describe overall worsening for 3 months. Feet will swell for 3-4 days to point where he cannot wear shoe. Soreness also occurs and severe to point where he cannot put pressure onto feet to walk and relies on family members to get to toilet. The soreness is now whole foot - inc heel bx before could walk on heels. At first, these sxs would subside and he would be OK for 3 weeks or so and then it would flare up again. Now becoming more freq and severe. On the 24th - he could not walk at all. Today, is a better day bc able to walk with walker but NOT a normal day. He stated the steroid helped the swelling a bit and the soreness a bit but did not resolve the pain and edema. He has no other joints that are affected. He remembers the colchicine helping in past bit hasn't taken for some time.   He has some typical features of gout - h/o podagra, flares, - and RF - male, age, CKD, diuretics. But some atypical sxs as well - no crystals in joint, no relief with NSAID, steroids. At this point, I am not certain if gout - partially treated - or something else.   He is concerned that in a day or two, it will flare again and he will not be able to walk.  PLAN : 1. Referral to rheum 2. Trial of colchicine 0.6 BID for prophylaxis 3. RTC in 4 weeks 4. I am not stopping BP meds  even though RF for gout as I am not sure of gout dx and BP hard to manage

## 2016-03-07 NOTE — Assessment & Plan Note (Signed)
I reviewed Dr Lianne Moris aug note and explained conclusions to pt and son.  PLAN : Cont RF mgmt

## 2016-03-07 NOTE — Assessment & Plan Note (Signed)
A1C in June 6.6. On levemir 20 and glucotrol 10 BID. Since steroid taper for gout, fasting CBG > 200. Had been well controlled before. UTD on all metrics. No sxs hyperglycemia.  PLAN : increase levemir to 24.  CBG fasting QAM To call when CBG < 150 Ask [name] to call 5th to see CBG control on new dose.

## 2016-03-07 NOTE — Patient Instructions (Signed)
Take the colchicine one pill twice a day. This is to try to prevent another flare of foot pain and swelling. I am scheduling you to see rheumatology. INCREASE your levemir (insulin) from 20 units to 24 units. Someone will call you Monday to check on your sugars. Call if problems.

## 2016-03-07 NOTE — Progress Notes (Signed)
   Subjective:    Patient ID: Dylan [name], male    DOB: [birth year], 70 y.o.   MRN: [id number]  HPI  Dylan [name] is here for B foot pain. Please see the A&P for the status of the pt's chronic medical problems.  ROS : per ROS section and in problem oriented charting. All other systems are negative.  PMHx, Soc hx, and / or Fam hx : PMHx inc podagra, CKD. Social hx - now requiring walker or even person assistance to walk.  Review of Systems  Constitutional: Positive for activity change. Negative for fever.  Respiratory: Negative for cough and shortness of breath.   Cardiovascular: Positive for leg swelling. Negative for chest pain.  Musculoskeletal: Positive for arthralgias, gait problem, joint swelling and myalgias.  Skin: Positive for color change. Negative for wound.       Objective:   Physical Exam  Constitutional: He is oriented to person, place, and time. He appears well-developed and well-nourished. No distress.  HENT:  Head: Normocephalic and atraumatic.  Right Ear: External ear normal.  Left Ear: External ear normal.  Nose: Nose normal.  Eyes: Conjunctivae and EOM are normal.  Stable B proptosis   Pulmonary/Chest: Effort normal.  Musculoskeletal: Normal range of motion. He exhibits edema. He exhibits no tenderness or deformity.  Neurological: He is alert and oriented to person, place, and time.  Skin: Skin is warm and dry. No rash noted. He is not diaphoretic. No erythema. No pallor.  No wounds. Increased pigmentation R foot at metatarsal heads, much less so on L foot. Trace edema on R foot and pre-tibial. No pain to manipulation of feet or toes or pressure to plantar surface. No erythema  Psychiatric: He has a normal mood and affect. His behavior is normal. Judgment and thought content normal.       Assessment & Plan:

## 2016-03-07 NOTE — Telephone Encounter (Signed)
Note patient CBGs increased since on steroids for gout. Levemir increased to 24 units/day. Will follow up next Tuesday to assess CBGs on new dose.

## 2016-03-07 NOTE — Assessment & Plan Note (Signed)
Still without CPAP / BiPAP.

## 2016-03-12 ENCOUNTER — Other Ambulatory Visit: Payer: Self-pay | Admitting: *Deleted

## 2016-03-12 DIAGNOSIS — E1122 Type 2 diabetes mellitus with diabetic chronic kidney disease: Secondary | ICD-10-CM

## 2016-03-12 DIAGNOSIS — N183 Chronic kidney disease, stage 3 (moderate): Secondary | ICD-10-CM

## 2016-03-12 MED ORDER — GLIPIZIDE 5 MG PO TABS: 10.0000 mg | ORAL_TABLET | Freq: Two times a day (BID) | ORAL | 3 refills | Status: DC

## 2016-03-13 ENCOUNTER — Telehealth: Payer: Self-pay | Admitting: Dietician

## 2016-03-13 NOTE — Telephone Encounter (Signed)
Called patient to obtain blood sugar readings per Dr. Zenovia Jarred request as they have been higher since he took a short course of steroids in August.  He reports that his blood sugar was 269 fasting today Last week they were always in the 200s, 270- 290 range.  Medicine- levemir pen - 24 units a day and he was not sure of the other one but said glipizide sounded right. Seeing foot specialist this Monday, feet sore, but walking without walker now.

## 2016-03-13 NOTE — Telephone Encounter (Signed)
Called pt and requested he increase insulin to 28 units starting tonight.  ' Told me he has appt Dr Amil Amen on 11th. Abel to walk now without walker but still pain.

## 2016-04-07 ENCOUNTER — Other Ambulatory Visit: Payer: Self-pay | Admitting: Internal Medicine

## 2016-04-07 DIAGNOSIS — M1000 Idiopathic gout, unspecified site: Secondary | ICD-10-CM

## 2016-04-08 NOTE — Assessment & Plan Note (Signed)
Called pt. Dylan [name] better. L [name] still sore. Was able to wear shoes (non sneakers) for first time Sunday. Thinks the colchicine (0.6 BID) is helping. I encouraged him to keep Rheum appt and mailed him papers with date and time. I elected not to start allopurinol as I prefer expect opinion on whether gout is the right dx since no crystals on arthrocentesis during a recent flare.

## 2016-04-08 NOTE — Telephone Encounter (Signed)
Called pt. R foot better. L foot still sore. Was able to wear shoes (non sneakers) for first time Sunday. Thinks the colchicine (0.6 BID) is helping. I encouraged him to keep Rheum appt and mailed him papers with date and time. I elected not to start allopurinol as I prefer expect opinion on whether gout is the right dx since no crystals on arthrocentesis during a recent flare.

## 2016-04-11 ENCOUNTER — Encounter: Payer: Self-pay | Admitting: Internal Medicine

## 2016-04-11 ENCOUNTER — Ambulatory Visit (INDEPENDENT_AMBULATORY_CARE_PROVIDER_SITE_OTHER): Payer: Medicare Other | Admitting: Internal Medicine

## 2016-04-11 VITALS — BP 122/51 | HR 66 | Temp 97.9°F | Wt 197.3 lb

## 2016-04-11 DIAGNOSIS — E1121 Type 2 diabetes mellitus with diabetic nephropathy: Secondary | ICD-10-CM

## 2016-04-11 DIAGNOSIS — E1129 Type 2 diabetes mellitus with other diabetic kidney complication: Secondary | ICD-10-CM

## 2016-04-11 DIAGNOSIS — Z79899 Other long term (current) drug therapy: Secondary | ICD-10-CM | POA: Diagnosis not present

## 2016-04-11 DIAGNOSIS — Z794 Long term (current) use of insulin: Secondary | ICD-10-CM

## 2016-04-11 DIAGNOSIS — Z87891 Personal history of nicotine dependence: Secondary | ICD-10-CM

## 2016-04-11 DIAGNOSIS — M1000 Idiopathic gout, unspecified site: Secondary | ICD-10-CM | POA: Diagnosis not present

## 2016-04-11 DIAGNOSIS — Z7982 Long term (current) use of aspirin: Secondary | ICD-10-CM | POA: Diagnosis not present

## 2016-04-11 DIAGNOSIS — I152 Hypertension secondary to endocrine disorders: Secondary | ICD-10-CM

## 2016-04-11 DIAGNOSIS — I10 Essential (primary) hypertension: Secondary | ICD-10-CM

## 2016-04-11 DIAGNOSIS — N289 Disorder of kidney and ureter, unspecified: Secondary | ICD-10-CM

## 2016-04-11 DIAGNOSIS — R238 Other skin changes: Secondary | ICD-10-CM | POA: Diagnosis not present

## 2016-04-11 DIAGNOSIS — E1159 Type 2 diabetes mellitus with other circulatory complications: Secondary | ICD-10-CM | POA: Diagnosis not present

## 2016-04-11 LAB — POCT GLYCOSYLATED HEMOGLOBIN (HGB A1C): Hemoglobin A1C: 6.9

## 2016-04-11 LAB — GLUCOSE, CAPILLARY: Glucose-Capillary: 155 mg/dL — ABNORMAL HIGH (ref 65–99)

## 2016-04-11 NOTE — Patient Instructions (Signed)
1. I am happy your feet are doing better 2. See the gout doctor 3. Your blood pressure and sugar are doing great. 4.Chaange your insulin to 24 units

## 2016-04-11 NOTE — Assessment & Plan Note (Signed)
BP 122/51 today. His regimen is eplerone 50 QD, hydral 50 Q8 hrs, Labetalol 400 BID, verapamil 360 QD, minoxidil 20 QD, Demadex 20 in AM, 10 in PM. He had bottle of clonidine in his med bag which we had D/C'd and he confirmed that he is not longer taking.  PLAN:  Cont current meds

## 2016-04-11 NOTE — Assessment & Plan Note (Signed)
A1C 6.9 today from 6.6. I had increased his levemir in step wise fashion from 20 to 26 units 2/2 steroid induced hyperglycemia. Meter today shows 74 - 144 fasting. He did not feel hypoglycemic when less than 100. He had one post-prandial of 318 yesterday. He is also on glipizide 5 mg 2 pills BID. His fasting CBG are generally controlled. Considering age, CBG's, and A1C I am more concerned about hypoglycemia if I cont the 26 units levemir than mild hyperglycemia and therefore will reduce levemir to 24 units. He probably does have post prandial hyperglycemia but adding pre-meal insulin would be problematic.   Marland KitchenPLAN : Cont glipizide as is Levemir 24 units QD

## 2016-04-11 NOTE — Progress Notes (Signed)
   Subjective:    Patient ID: Dylan [name], male    DOB: 06/22/1946, 70 y.o.   MRN: [id number]  HPI  Dylan [name] is here for DM F/U. Please see the A&P for the status of the pt's chronic medical problems.  ROS : per ROS section and in problem oriented charting. All other systems are negative.  PMHx, Soc hx, and / or Fam hx : Son works for [employer] as a Freight forwarder and will be able to take Mr Linch to rheum appt.  Review of Systems  Constitutional: Negative for activity change and appetite change.  Cardiovascular: Negative for leg swelling.  Endocrine:       No hypoglycemic sxs  Musculoskeletal: Negative for arthralgias, gait problem and joint swelling.  Skin:       Darkening of skin in feet and distal legs. Peeling of skin on L foot from resolved edema.       Objective:   Physical Exam  Constitutional: He appears well-developed and well-nourished. No distress.  HENT:  Head: Normocephalic and atraumatic.  Right Ear: External ear normal.  Left Ear: External ear normal.  Nose: Nose normal.  Eyes: Conjunctivae and EOM are normal.  Exophthalmos B   Cardiovascular:  Feet warm, could not palpate DP - doppler are present and biphasic but a bit sluggish,  Musculoskeletal: Normal range of motion. He exhibits no edema or tenderness.  Skin: Skin is warm and dry. He is not diaphoretic.  Darkened skin on feet and lower legs. Peeling skin on L foot. No edema.  Psychiatric: He has a normal mood and affect. His behavior is normal. Judgment and thought content normal.          Assessment & Plan:

## 2016-04-11 NOTE — Assessment & Plan Note (Signed)
Cont to endorse improvement on colchicine 0.6 BID. R foot totally resolved. L foot a bit tender when puts weight on it. All edema resolved. Able to wear nl shoes. I encouraged him again to keep Rheum appt next week, confirmed he has transportation. Will cont colchicine for now until he sees rheum for further recs.  PLAN:  Cont current meds Rheum next week.

## 2016-04-12 ENCOUNTER — Ambulatory Visit: Payer: Medicare Other | Admitting: Podiatry

## 2016-04-18 ENCOUNTER — Encounter: Payer: Medicare Other | Admitting: Podiatry

## 2016-04-19 NOTE — Progress Notes (Signed)
This encounter was created in error - please disregard.

## 2016-04-22 DIAGNOSIS — M2550 Pain in unspecified joint: Secondary | ICD-10-CM | POA: Diagnosis not present

## 2016-05-07 DIAGNOSIS — M1A09X0 Idiopathic chronic gout, multiple sites, without tophus (tophi): Secondary | ICD-10-CM | POA: Diagnosis not present

## 2016-05-07 DIAGNOSIS — M2550 Pain in unspecified joint: Secondary | ICD-10-CM | POA: Diagnosis not present

## 2016-06-28 ENCOUNTER — Encounter: Payer: Self-pay | Admitting: Family

## 2016-07-11 ENCOUNTER — Ambulatory Visit (INDEPENDENT_AMBULATORY_CARE_PROVIDER_SITE_OTHER): Payer: Medicare Other | Admitting: Internal Medicine

## 2016-07-11 ENCOUNTER — Encounter: Payer: Self-pay | Admitting: Internal Medicine

## 2016-07-11 ENCOUNTER — Other Ambulatory Visit: Payer: Self-pay | Admitting: *Deleted

## 2016-07-11 VITALS — BP 154/91 | HR 71 | Temp 98.2°F | Ht 66.0 in | Wt 206.7 lb

## 2016-07-11 DIAGNOSIS — I5032 Chronic diastolic (congestive) heart failure: Secondary | ICD-10-CM

## 2016-07-11 DIAGNOSIS — N289 Disorder of kidney and ureter, unspecified: Secondary | ICD-10-CM

## 2016-07-11 DIAGNOSIS — I152 Hypertension secondary to endocrine disorders: Secondary | ICD-10-CM | POA: Diagnosis not present

## 2016-07-11 DIAGNOSIS — I10 Essential (primary) hypertension: Secondary | ICD-10-CM

## 2016-07-11 DIAGNOSIS — M1A9XX0 Chronic gout, unspecified, without tophus (tophi): Secondary | ICD-10-CM

## 2016-07-11 DIAGNOSIS — E1129 Type 2 diabetes mellitus with other diabetic kidney complication: Secondary | ICD-10-CM | POA: Diagnosis not present

## 2016-07-11 DIAGNOSIS — E1159 Type 2 diabetes mellitus with other circulatory complications: Secondary | ICD-10-CM | POA: Diagnosis not present

## 2016-07-11 DIAGNOSIS — Z7982 Long term (current) use of aspirin: Secondary | ICD-10-CM

## 2016-07-11 DIAGNOSIS — G4733 Obstructive sleep apnea (adult) (pediatric): Secondary | ICD-10-CM

## 2016-07-11 DIAGNOSIS — Z8249 Family history of ischemic heart disease and other diseases of the circulatory system: Secondary | ICD-10-CM

## 2016-07-11 DIAGNOSIS — Z0000 Encounter for general adult medical examination without abnormal findings: Secondary | ICD-10-CM

## 2016-07-11 DIAGNOSIS — M1000 Idiopathic gout, unspecified site: Secondary | ICD-10-CM

## 2016-07-11 DIAGNOSIS — Z87891 Personal history of nicotine dependence: Secondary | ICD-10-CM

## 2016-07-11 DIAGNOSIS — Z23 Encounter for immunization: Secondary | ICD-10-CM | POA: Diagnosis not present

## 2016-07-11 DIAGNOSIS — Z794 Long term (current) use of insulin: Secondary | ICD-10-CM

## 2016-07-11 DIAGNOSIS — Z79899 Other long term (current) drug therapy: Secondary | ICD-10-CM

## 2016-07-11 DIAGNOSIS — E1121 Type 2 diabetes mellitus with diabetic nephropathy: Secondary | ICD-10-CM

## 2016-07-11 DIAGNOSIS — I739 Peripheral vascular disease, unspecified: Secondary | ICD-10-CM

## 2016-07-11 LAB — POCT GLYCOSYLATED HEMOGLOBIN (HGB A1C): Hemoglobin A1C: 5.9

## 2016-07-11 LAB — GLUCOSE, CAPILLARY: Glucose-Capillary: 135 mg/dL — ABNORMAL HIGH (ref 65–99)

## 2016-07-11 MED ORDER — TORSEMIDE 10 MG PO TABS: ORAL_TABLET | ORAL | 3 refills | Status: DC

## 2016-07-11 MED ORDER — COLCHICINE 0.6 MG PO TABS: 0.6000 mg | ORAL_TABLET | Freq: Every day | ORAL | 0 refills | Status: DC

## 2016-07-11 MED ORDER — ALLOPURINOL 100 MG PO TABS: 100.0000 mg | ORAL_TABLET | Freq: Every day | ORAL | 3 refills | Status: DC

## 2016-07-11 NOTE — Assessment & Plan Note (Signed)
This problem is chronic and uncontrolled. In 2016 he had a sleep study that showed severe obstructive sleep apnea with an AHI of 128. He had a CPAP was not cleaning the machine. He has since not use CPAP he would like a new different machine. His insurance is requiring a new sleep study before a new machine. He is not certain he wants to undergo a new sleep study because it is uncomfortable and he did not enjoy doing. We went over the complication and consequences of untreated sleep apnea and he understands. He is willing to think about having another sleep study.  PLAN : Encourage repeat sleep study

## 2016-07-11 NOTE — Assessment & Plan Note (Signed)
This problem is chronic and worse. His blood pressure regimen is hydralazine 50 q8, labetalol 400 twice a day, verapamil 360 daily, minoxidil 20 daily, Demadex 20 in the morning and 10 in the evening, and epleronone 50 once a day. He is compliant with his medications and has no side effects. He does not check his blood pressure at home. Most recent blood pressure was well controlled. His repeat today was better. Therefore we will leave his medications as is and recheck.  PLAN:  Cont current meds   BP Readings from Last 3 Encounters:  07/11/16 (!) 154/91  04/11/16 (!) 122/51  03/07/16 (!) 159/59

## 2016-07-11 NOTE — Assessment & Plan Note (Signed)
This problem is chronic and improved. He has seen rheumatology. He was quite pleased with the visit and told me that they agreed with the diagnosis of gout. He has since been started on allopurinol 100 daily. He was told to continue his colchicine 0.6 daily. They checked a uric acid and I will need to get the records to see if his uric acid level is at goal. He has no foot pain and no foot edema and is able to walk without any difficulty. He is quite happy with the progress he has made with his gout.  PLAN:  Cont current meds Obtain rheum records

## 2016-07-11 NOTE — Patient Instructions (Signed)
FOR YOUR SUGAR WHILE YOU ARE FASTING 1. DECREASE your levemir insulin to 20 units.  2. Take your sugar pill glipizide in the morning BUT NOT IN THE EVENING. 3. Check you sugar twice a day, morning fasting and also in evening, and let me know if lower than 70 or higher than 250 4. Once you stop fasting, go back to your normal sugar medicines   You got the pneumonia vaccine today called PCV 23  Think about getting another sleep study

## 2016-07-11 NOTE — Assessment & Plan Note (Signed)
This problem is chronic and stable. He shared with me that his youngest sister died over the Thanksgiving holiday. She had had a CABG and heart failure and passed away of complications from these. He stated he had an enlarged heart. I reviewed his most recent echo. I shared with him that he had evidence of LVH and diastolic heart failure but had a normal EF.  PLAN : Control blood pressure.

## 2016-07-11 NOTE — Progress Notes (Signed)
Subjective:    Patient ID: Dylan [name], male    DOB: 01-08-1946, 71 y.o.   MRN: [id number]  HPI Dylan [name] is here for DM F/U. Please see the A&P for the status of the pt's chronic medical problems.  ROS : per ROS section and in problem oriented charting. All other systems are negative.  PMHx, Soc hx, and / or Fam hx : Gout. Sister recently died (Nov 5247) of CHF complications. Soc and fam hx sections updated   Review of Systems  Constitutional: Negative for activity change and unexpected weight change.  HENT: Positive for rhinorrhea.   Eyes: Positive for visual disturbance. Negative for pain and itching.       L eye blurry  Respiratory: Negative for shortness of breath.   Cardiovascular: Positive for chest pain.  Gastrointestinal: Negative for constipation.       Inc abd girth       Objective:   Physical Exam  Constitutional: He is oriented to person, place, and time. He appears well-developed and well-nourished. No distress.  HENT:  Head: Normocephalic and atraumatic.  Right Ear: External ear normal.  Left Ear: External ear normal.  Nose: Nose normal.  Eyes: Conjunctivae and EOM are normal. Right eye exhibits no discharge. Left eye exhibits no discharge. No scleral icterus.  Cardiovascular: Normal rate, regular rhythm and normal heart sounds.   Early 3/6 systolic murmur  Pulmonary/Chest: Effort normal and breath sounds normal. No respiratory distress. He has no wheezes.  Abdominal: Soft. Bowel sounds are normal.  Musculoskeletal: Normal range of motion. He exhibits no edema.  Neurological: He is alert and oriented to person, place, and time.  Skin: He is not diaphoretic.  Psychiatric: He has a normal mood and affect. His behavior is normal. Judgment and thought content normal.          Assessment & Plan:

## 2016-07-11 NOTE — Assessment & Plan Note (Signed)
A chronic problem and is stable. His A1c has decreased from 6.9-5.9. His regimen is Levemir 24 units and glipizide 5 mg 2 pills twice a day. He checks his CBGs were today. His morning CBC typically runs between 100 - 150. He will go as high as close to 200 and sometimes in the high 70s or 80s. On December 25 he had a fasting of 41. His evenings typically run between 150 and 200. He told me that last night he had a glucose of 55. He was asymptomatic. He started 21 days fast yesterday that only allows eating in the morning and water the rest of day.  With his A1c some low and a 21 days fast I am very concerned about ongoing hypoglycemia. I would prefer his A1c to be 7-7.5. I have instructed him to decrease his Levemir to 20 units and only take the glipizide in the morning. I have given him parameters for his glucose and instructions to call if they fall outside of the recommended values. I told him to resume his normal regimen once the facet over. I have concerns over this due to his low A1c. However his glucose meter all does not endorse significant hypoglycemia. It could be that he is having hypoglycemia midday. However he has a couple of recent midday values that were 121 and 140. Therefore I'm not convinced midday hypo-glycemia.  PLAN : While fasting, decrease Levemir to 20 units and glipizide to QAM Resume normal dosing after fasting is completed Follow A1c with goal of 7-7.5

## 2016-07-11 NOTE — Assessment & Plan Note (Signed)
He received his PCV 2030 today.

## 2016-07-12 ENCOUNTER — Encounter: Payer: Self-pay | Admitting: Family

## 2016-07-12 ENCOUNTER — Ambulatory Visit (INDEPENDENT_AMBULATORY_CARE_PROVIDER_SITE_OTHER): Payer: Medicare Other | Admitting: Family

## 2016-07-12 ENCOUNTER — Ambulatory Visit (HOSPITAL_COMMUNITY)
Admission: RE | Admit: 2016-07-12 | Discharge: 2016-07-12 | Disposition: A | Discharge status: Home / Self Care | Payer: Medicare Other | Source: Ambulatory Visit | Attending: Family | Admitting: Family

## 2016-07-12 VITALS — BP 159/66 | HR 69 | Temp 98.1°F | Resp 18 | Ht 66.0 in | Wt 207.2 lb

## 2016-07-12 DIAGNOSIS — N183 Chronic kidney disease, stage 3 (moderate): Secondary | ICD-10-CM | POA: Diagnosis not present

## 2016-07-12 DIAGNOSIS — E1122 Type 2 diabetes mellitus with diabetic chronic kidney disease: Secondary | ICD-10-CM | POA: Diagnosis not present

## 2016-07-12 DIAGNOSIS — I739 Peripheral vascular disease, unspecified: Secondary | ICD-10-CM | POA: Insufficient documentation

## 2016-07-12 DIAGNOSIS — I70213 Atherosclerosis of native arteries of extremities with intermittent claudication, bilateral legs: Secondary | ICD-10-CM | POA: Diagnosis not present

## 2016-07-12 NOTE — Patient Instructions (Signed)
Intermittent Claudication Intermittent claudication is pain in your leg that occurs when you walk or exercise and goes away when you rest. The pain can occur in one or both legs. CAUSES Intermittent claudication is caused by the buildup of plaque within the major arteries in the body (atherosclerosis). The plaque, which makes arteries stiff and narrow, prevents enough blood from reaching your leg muscles. The pain occurs when you walk or exercise because your muscles need more blood when you are moving and exercising. RISK FACTORS Risk factors include:  A family history of atherosclerosis.  A personal history of stroke or heart disease.  Older age.  Being inactive or overweight.  Smoking cigarettes.  Having another health condition such as:  Diabetes.  High blood pressure.  High cholesterol. SIGNS AND SYMPTOMS  Your hip or leg may:   Ache.  Cramp.  Feel tight.  Feel weak.  Feel heavy. Over time, you may feel pain in your calf, thigh, or hip. DIAGNOSIS  Your health care provider may diagnose intermittent claudication based on your symptoms and medical history. Your health care provider may also do tests to learn more about your condition. These may include:  Blood tests.  An ultrasound.  Imaging tests such as angiography, magnetic resonance angiography (MRA), and computed tomography angiography (CTA). TREATMENT You may be treated for problems such as:  High blood pressure.  High cholesterol.  Diabetes. Other treatments may include:  Lifestyle changes such as:  Starting an exercise program.  Losing weight.  Quitting smoking.  Medicines to help restore blood flow through your legs.  Blood vessel surgery (angioplasty) to restore blood flow if your intermittent claudication is caused by severe peripheral artery disease. HOME CARE INSTRUCTIONS  Manage any other health conditions you have.  Eat a diet low in saturated fats and calories to maintain a  healthy weight.  Quit smoking, if you smoke.  Take medicines only as directed by your health care provider.  If your health care provider recommended an exercise program for you, follow it as directed. Your exercise program may involve:  Walking three or more times a week.  Walking until you have certain symptoms of intermittent claudication.  Resting until symptoms go away.  Gradually increasing walking time to about 50 minutes a day. SEEK MEDICAL CARE IF: Your condition is not getting better or is getting worse. SEEK IMMEDIATE MEDICAL CARE IF:   You have chest pain.  You have difficulty breathing.  You develop arm weakness.  You have trouble speaking.  Your face begins to droop. MAKE SURE YOU:  Understand these instructions.  Will watch your condition.  Will get help if you are not doing well or get worse. This information is not intended to replace advice given to you by your health care provider. Make sure you discuss any questions you have with your health care provider. Document Released: 04/26/2004 Document Revised: 07/15/2014 Document Reviewed: 09/30/2013 Elsevier Interactive Patient Education  2017 [employer].

## 2016-07-12 NOTE — Progress Notes (Signed)
Established Intermittent Claudication  History of Present Illness  Dylan [name] is a 71 y.o. (Dec 01, 1945) male patient of Dr. Bridgett Larsson who presents with chief complaint: pain in feet.  The patient's symptoms have not progressed.  The patient's symptoms were: foot pain with walking, relieved by treatment of gout.  Cramping in calves resolved, occasional left hip pain noted with walking.  The patient's treatment regimen currently includes: maximal medical management and walking plan.  He was diagnosed with gout in his feet about November 2017, was started on allopurinol and colchicine; severe pain in his feet limited his walking until he was started on the above medications; since then he has been walking more and his pain in feet is relieved.   He denies any known history of stroke or TIA.   Pt Diabetic: Yes, 5.9 A1C on 07-11-16 Pt smoker: former smoker, quit in 1974  Pt meds include: Statin :Yes Betablocker: Yes ASA: Yes Other anticoagulants/antiplatelets: no   Past Medical History:  Diagnosis Date  . Anemia 10/18/2014   Baseline about 12 and stable from 2010 to 2016. Colon 2009 in [place] (records cannot be obtained).  EGD Dr Benson Norway 2011 nl   . Chronic diastolic heart failure ([place]) 10/18/2014   Noted ECHO 10/2014. Grade 2. EF 50-55%   . Chronic renal failure, stage 3 (moderate) 10/18/2014   Baseline Cr about 1.5. Stable from 2010 to 2016.   Marland Kitchen Coronary artery disease   . Diverticulosis 10/08/2014   Seen on CT. Reportedly on Colon in [place] in 2009. Freq bouts of diverticulitis.  . DM (diabetes mellitus), type 2 with renal complications ([place]) 11/11/4933  . Essential hypertension 10/09/2014   Poor control with 5 drug therapy.   . Former tobacco use 10/08/2014  . Gout   . OSA on CPAP 10/08/2014  . Pulmonary HTN 10/18/2014   Noted as severe ECHO 2016. Likely 2/2 severe OSA.    Social History Social History  Substance Use Topics  . Smoking status: Former Smoker    Quit date: 10/02/1972  . Smokeless  tobacco: Never Used  . Alcohol use No    Family History Family History  Problem Relation Age of Onset  . Heart failure Sister     Died of complications Nov 5217  . CAD Sister     Surgical History Past Surgical History:  Procedure Laterality Date  . CHOLECYSTECTOMY      Allergies  Allergen Reactions  . Spironolactone Other (See Comments)    Gynecomastia per pt report.    Current Outpatient Prescriptions  Medication Sig Dispense Refill  . albuterol (PROVENTIL HFA;VENTOLIN HFA) 108 (90 BASE) MCG/ACT inhaler Inhale 1-2 puffs into the lungs every 6 (six) hours as needed for wheezing or shortness of breath. 3 Inhaler 0  . allopurinol (ZYLOPRIM) 100 MG tablet Take 1 tablet (100 mg total) by mouth daily. 90 tablet 3  . alum & mag hydroxide-simeth (MAALOX/MYLANTA) 200-200-20 MG/5ML suspension Take 30 mLs by mouth every 6 (six) hours as needed for indigestion or heartburn (dyspepsia). 355 mL 0  . aspirin 81 MG chewable tablet Chew 1 tablet (81 mg total) by mouth daily. 90 tablet 3  . colchicine 0.6 MG tablet Take 1 tablet (0.6 mg total) by mouth daily. 90 tablet 0  . docusate sodium (COLACE) 100 MG capsule Take 1 capsule (100 mg total) by mouth every 12 (twelve) hours. 60 capsule 0  . eplerenone (INSPRA) 50 MG tablet TAKE ONE TABLET BY MOUTH ONCE DAILY 90 tablet 3  .  fluticasone (FLONASE) 50 MCG/ACT nasal spray Place 1-2 sprays into both nostrils daily. 16 g 2  . FREESTYLE LITE test strip Check blood sugar before meals and bedtime as instructed 100 each 12  . glipiZIDE (GLUCOTROL) 5 MG tablet Take 2 tablets (10 mg total) by mouth 2 (two) times daily before a meal. 360 tablet 3  . hydrALAZINE (APRESOLINE) 50 MG tablet Take 1 tablet (50 mg total) by mouth every 8 (eight) hours. 270 tablet 3  . ibuprofen (ADVIL) 200 MG tablet Take 2 tablets (400 mg total) by mouth every 12 (twelve) hours as needed. 100 tablet 2  . ketotifen (ZADITOR) 0.025 % ophthalmic solution Place 1 drop into both eyes 2  (two) times daily.    Marland Kitchen labetalol (NORMODYNE) 200 MG tablet TAKE TWO TABLETS BY MOUTH TWICE DAILY 360 tablet 3  . LEVEMIR FLEXTOUCH 100 UNIT/ML Pen INJECT 20 UNITS SUBCUTANEOUSLY ONCE DAILY AT 10PM (Patient taking differently: INJECT 24 UNITS SUBCUTANEOUSLY ONCE DAILY AT 10PM) 15 mL 7  . minoxidil (LONITEN) 10 MG tablet Take 2 tablets (20 mg total) by mouth daily. 180 tablet 3  . pravastatin (PRAVACHOL) 40 MG tablet Take 1 tablet (40 mg total) by mouth every evening. 90 tablet 3  . torsemide (DEMADEX) 10 MG tablet Take 2 in morning and 1 in afternoon 270 tablet 3  . verapamil (VERELAN PM) 360 MG 24 hr capsule TAKE ONE CAPSULE BY MOUTH ONCE DAILY 90 capsule 3   No current facility-administered medications for this visit.     REVIEW OF SYSTEMS: see HPI for pertinent positives and negatives    Physical Examination  Vitals:   07/12/16 0950 07/12/16 0952  BP: (!) 168/71 (!) 159/66  Pulse: 69   Resp: 18   Temp: 98.1 F (36.7 C)   TempSrc: Oral   SpO2: 96%   Weight: 207 lb 3.2 oz (94 kg)   Height: _0  (1.676 m)    Body mass index is 33.44 kg/m.  General: A&O x 3, WDWN, obese male  Pulmonary: Sym exp, respirations are non labored, good air movt, CTAB, no rales, rhonchi, or wheezing  Cardiac: RRR, Nl S1, S2, no Murmurs, rubs or gallops  Vascular: Vessel Right Left  Radial Palpable Palpable  Brachial Palpable Palpable  Carotid Palpable, without bruit Palpable, without bruit  Aorta Not palpable N/A  Femoral Palpable Palpable  Popliteal Not palpable Not palpable  PT Not Palpable Not Palpable  DP Not Palpable Not Palpable   Gastrointestinal: soft, NTND, no G/R, no HSM, no palpable masses, no CVAT B  Musculoskeletal: M/S 5/5 throughout , Extremities without ischemic changes   Neurologic: Pain and light touch intact in extremities , Motor exam as listed above     Non-Invasive Vascular Imaging ABI (Date: 07/12/2016)  R: 0.61 (0.43 on 01-01-16), DP: mono, PT: mono, TBI:  0.44 (was 0.23)  L: 0.73 (was 0.69), DP: mono, PT: mono, TBI: 0.40 (was 0.26)    Medical Decision Making   Dylan [name] is a 71 y.o. male who no longer has pain in his feet with walking since he was started on gout treatment with allopurinol and colchicine. He is walking more, does not seem to have claudication symptoms despite moderate arterial occlusive disease by ABI's in both legs.  Bilateral ABI's and TBI's have improved since he has been walking more.   However, pt does have Stage 3 CKD, consider renal effect from long term use of colchicine.    There are no signs of ischemia in his  feet/legs.   Based on the patient's vascular studies and examination, I have offered the patient: return in 6 months with ABI's.  Graduated walking program discussed and how to achieve.  Pt states he has not yet taken his htn meds today, states his blood pressure comes down after he takes his meds.   His atherosclerotic risk factors include currently well controlled DM, former smoker, hypertension, and obesity.   I discussed in depth with the patient the nature of atherosclerosis, and emphasized the importance of maximal medical management including strict control of blood pressure, blood glucose, and lipid levels, antiplatelet agents, obtaining regular exercise, and cessation of smoking.         I discussed with the patient the natural history of intermittent claudication: 75% of  patients have stable or improved symptoms in a year an only 2% require amputation. Eventually 20% may require intervention in a year.  The patient is aware that without maximal medical management the underlying atherosclerotic disease process will progress, limiting the benefit of any interventions. The patient is currently on a statin: pravastatin 40 mg.  The patient is currently on an anti-platelet: ASA 81 mg.    Thank you for allowing [place] to participate in this patient's care.  Tiah Heckel, Sharmon Leyden, RN, MSN,  FNP-C [facility] Office: [phone]  07/12/2016, 9:57 AM  Clinic MD: Bridgett Larsson

## 2016-07-15 ENCOUNTER — Other Ambulatory Visit: Payer: Self-pay

## 2016-07-15 DIAGNOSIS — I70219 Atherosclerosis of native arteries of extremities with intermittent claudication, unspecified extremity: Secondary | ICD-10-CM

## 2016-09-29 ENCOUNTER — Other Ambulatory Visit: Payer: Self-pay | Admitting: Pulmonary Disease

## 2016-09-29 DIAGNOSIS — I10 Essential (primary) hypertension: Secondary | ICD-10-CM

## 2016-10-09 ENCOUNTER — Encounter: Payer: Self-pay | Admitting: Internal Medicine

## 2016-10-09 DIAGNOSIS — D696 Thrombocytopenia, unspecified: Secondary | ICD-10-CM | POA: Insufficient documentation

## 2016-10-09 HISTORY — DX: Thrombocytopenia, unspecified: D69.6

## 2016-10-10 ENCOUNTER — Encounter (INDEPENDENT_AMBULATORY_CARE_PROVIDER_SITE_OTHER): Payer: Self-pay

## 2016-10-10 ENCOUNTER — Encounter: Payer: Self-pay | Admitting: Internal Medicine

## 2016-10-10 ENCOUNTER — Ambulatory Visit (INDEPENDENT_AMBULATORY_CARE_PROVIDER_SITE_OTHER): Payer: Medicare Other | Admitting: Internal Medicine

## 2016-10-10 VITALS — BP 144/53 | HR 65 | Temp 98.0°F | Wt 211.1 lb

## 2016-10-10 DIAGNOSIS — E1159 Type 2 diabetes mellitus with other circulatory complications: Secondary | ICD-10-CM | POA: Diagnosis not present

## 2016-10-10 DIAGNOSIS — M1A9XX0 Chronic gout, unspecified, without tophus (tophi): Secondary | ICD-10-CM

## 2016-10-10 DIAGNOSIS — G4733 Obstructive sleep apnea (adult) (pediatric): Secondary | ICD-10-CM

## 2016-10-10 DIAGNOSIS — D696 Thrombocytopenia, unspecified: Secondary | ICD-10-CM | POA: Diagnosis not present

## 2016-10-10 DIAGNOSIS — I70213 Atherosclerosis of native arteries of extremities with intermittent claudication, bilateral legs: Secondary | ICD-10-CM | POA: Diagnosis not present

## 2016-10-10 DIAGNOSIS — E1121 Type 2 diabetes mellitus with diabetic nephropathy: Secondary | ICD-10-CM | POA: Diagnosis not present

## 2016-10-10 DIAGNOSIS — J309 Allergic rhinitis, unspecified: Secondary | ICD-10-CM

## 2016-10-10 DIAGNOSIS — E11649 Type 2 diabetes mellitus with hypoglycemia without coma: Secondary | ICD-10-CM | POA: Diagnosis not present

## 2016-10-10 DIAGNOSIS — Z87891 Personal history of nicotine dependence: Secondary | ICD-10-CM | POA: Diagnosis not present

## 2016-10-10 DIAGNOSIS — I152 Hypertension secondary to endocrine disorders: Secondary | ICD-10-CM | POA: Diagnosis not present

## 2016-10-10 DIAGNOSIS — Z794 Long term (current) use of insulin: Secondary | ICD-10-CM

## 2016-10-10 DIAGNOSIS — D649 Anemia, unspecified: Secondary | ICD-10-CM

## 2016-10-10 DIAGNOSIS — Z79899 Other long term (current) drug therapy: Secondary | ICD-10-CM

## 2016-10-10 DIAGNOSIS — M1000 Idiopathic gout, unspecified site: Secondary | ICD-10-CM

## 2016-10-10 DIAGNOSIS — I10 Essential (primary) hypertension: Secondary | ICD-10-CM

## 2016-10-10 DIAGNOSIS — Z7984 Long term (current) use of oral hypoglycemic drugs: Secondary | ICD-10-CM | POA: Diagnosis not present

## 2016-10-10 DIAGNOSIS — N289 Disorder of kidney and ureter, unspecified: Secondary | ICD-10-CM

## 2016-10-10 DIAGNOSIS — E1129 Type 2 diabetes mellitus with other diabetic kidney complication: Secondary | ICD-10-CM | POA: Diagnosis not present

## 2016-10-10 DIAGNOSIS — D638 Anemia in other chronic diseases classified elsewhere: Secondary | ICD-10-CM

## 2016-10-10 LAB — GLUCOSE, CAPILLARY: Glucose-Capillary: 150 mg/dL — ABNORMAL HIGH (ref 65–99)

## 2016-10-10 LAB — POCT GLYCOSYLATED HEMOGLOBIN (HGB A1C): Hemoglobin A1C: 5.7

## 2016-10-10 MED ORDER — TRIAMCINOLONE ACETONIDE 55 MCG/ACT NA AERO: 2.0000 | INHALATION_SPRAY | Freq: Every day | NASAL | 2 refills | Status: DC

## 2016-10-10 NOTE — Assessment & Plan Note (Signed)
This problem is chronic and stable. His baseline hemoglobin is about 12. His MCV and RDW are normal. His B12 was normal in 2016. No etiology was found. His colonoscopy records from 2000 were from [place] and not able to be obtained.   Plan : Continue to follow

## 2016-10-10 NOTE — Patient Instructions (Signed)
1. We are placing a continuous glucose monitor today 2. Come back 14th for CGM with me and [name] (will need to work out date and time) 3. Take Hydralazine = 2 pills in AM and 2 pills in PM 4. Your sugar is too well controlled. Based on the monitors results, I will decrease your medicines 5. Try the nasocort for your stuffy nose 6. Make an eye appt 7. See me in 3 months

## 2016-10-10 NOTE — Assessment & Plan Note (Addendum)
This problem is chronic and stable. His regimen is hydral 25 Q8, labetolol 400 BID, verap 360, minoxidil 20, Demadex 20 in a.m. and 10 in PM, and epleronone 50. Today he shared with me that he is only taking the hydralazine 1 pill twice a day. Over the past couple of days he has been taking 2 in the morning and 1 in the evening. BP is not controlled and hasn't been over past few appts.  He doesn't have nephropathy but is at risk with DM, HTN. Goal would be as close to 120/80 as possible without causing SE. HR 65 so do not want to suppress rate any farther. Will increase hydralazine but he does not think he could take it consistently 3 times a day.  PLAN : increase hydral - 2 pills twice a day Recheck blood pressure when he returns in a week If he is not controlled, will then encouraged 2 pills 3 times a day Other meds to stay same  BP Readings from Last 3 Encounters:  10/10/16 (!) 144/53  07/12/16 (!) 159/66  07/11/16 (!) 154/91

## 2016-10-10 NOTE — Assessment & Plan Note (Signed)
This problem is chronic and uncontrolled. He complains of nasal congestion which is worse at night and prevents him from breathing through his nose. He has stated in previous appointments that he snores. He tried Flonase but he did not like it for some reason. 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 There were no air-fluid levels. I did not want to get him on a vasoconstrictor as this is a chronic issue. I administered a using a different nasal corticosteroid and seeing if this provides relief.  PLAN : Nasacort

## 2016-10-10 NOTE — Assessment & Plan Note (Addendum)
This problem is chronic and uncontrolled. He had severe obstructive sleep apnea at his sleep study in 2016. I had reordered a sleep study at his last appointment. The patient was not clear today whether his insurance wouldn't cover the sleep study or a machine. I have message our front desk staff to see if they can check into the insurance coverage.  PLAN : Investigate repeat sleep study  Per front desk : F/U- I have been in contact with the patient and his Insurance and [place]. He lost his machine a couple of years ago and needs to replace it basically. 1. He can not get another until 2021 Which because he has not had it in 2 years would require a New Sleep Study because his settings could have changed. 2. He could be private pay and just pay for one @ $1200.00 and have a credit card on file with to pay for it along with every 3 month making a $200-$300.00 payment for supplies and needs no new Sleep Study because it would be out of pocket. Last , he could try to find his old machine to cut his cost. After talking with [employer] (Resp/Dept) and the patient he has decided to see if he could locate his old machine and let us know where to go from there.

## 2016-10-10 NOTE — Assessment & Plan Note (Signed)
This problem is chronic and worse. His A1c trend has been 6.9 - 5.9 - 5.7. He brought his log today and although he is asymptomatic he is having hypoglycemia with the lowest recorded of 71. His average CBG in the morning is just over 100 and his evening ranges anywhere from 75-225. He is no longer fasting. His regimen includes Levemir 20 units and glipizide 10 mg twice a day. I am quite concerned with his low A1c and his asymptomatic hypoglycemia. I have to suspect significant unrecorded hypoglycemia including nocturnal. We agreed to start today with placement of a continuous glucose monitor and he will return in one week for download. At that time I expect that we will be cutting back his medication. Currently he is self adjusting his insulin and does not inject it if his sugar is on the lower side. His A1c goal for me would be less than 7.5.   PLAN : Continuous glucose monitor Return in one week for download Continue medicines as is with self adjustment

## 2016-10-10 NOTE — Progress Notes (Signed)
Freestyle Libre Pro CGM sensor placed and started. Patient was educated about wearing sensor, keeping food, activity and medication log and when to call office. Follow up was arranged with the patient.  Dylan [name], [name], [place] 10/10/2016 11:37 AM.

## 2016-10-10 NOTE — Assessment & Plan Note (Signed)
This problem is chronic and stable. The diagnosis is confirmed by rheumatology. He is on allopurinol 100 a day and colchicine 0.6 today. I was not successful getting the uric acid level from rheumatology at his last appointment but we are trying again today to see what his level was. I need to know this so I know when to stop his colchicine. Today he states his feet are pain-free except when he is walking and he pushes off and he has some slight pain at the MTP joints.  PLAN:  Cont current meds Attempt to get uric acid level from rheumatology

## 2016-10-10 NOTE — Assessment & Plan Note (Addendum)
This problem is chronic and stable. He recently saw vascular surgery who had no changes to his therapy. He did get repeat ABIs which showed moderate disease. This has not limited his ability to walk significantly. He is able to walk 150 yards before he has to stop due to achy legs. He rests in the sensation goes away. He is on risk factor modification - he does not smoke, he is on an aspirin, he is on a statin. He continues to walk. No concerning lesions on his feet.  PLAN : cont to follow

## 2016-10-10 NOTE — Progress Notes (Signed)
   Subjective:    Patient ID: Dylan [name], male    DOB: 04-14-1946, 71 y.o.   MRN: [id number]  HPI  Dylan [name] is here for HTN F/U. Please see the A&P for the status of the pt's chronic medical problems.  ROS : per ROS section and in problem oriented charting. All other systems are negative.  PMHx, Soc hx, and / or Fam hx :    Review of Systems  Constitutional: Positive for unexpected weight change. Negative for activity change.  HENT: Positive for congestion. Negative for sneezing.        + dry mouth  Eyes: Positive for itching.       + dry eyes  Respiratory: Negative for shortness of breath.        +DOE  Cardiovascular: Positive for chest pain.       Occ random CP but no CP on exertion  Gastrointestinal: Positive for abdominal pain and constipation.  Endocrine: Negative for polydipsia and polyuria.       No hypoglycemic sxs       Objective:   Physical Exam  Constitutional: He appears well-developed and well-nourished. No distress.  HENT:  Head: Normocephalic and atraumatic.  Right Ear: External ear normal.  Left Ear: External ear normal.  Nose: Nose normal.  B proptyosis  Eyes: EOM are normal.  Conunctiva injected bilaterally  Cardiovascular: Normal rate, regular rhythm and normal heart sounds.   DP pulses not detected B but feet warm  Abdominal: Soft.  Musculoskeletal: Normal range of motion. He exhibits no edema.  Skin: Skin is warm and dry. He is not diaphoretic.  Psychiatric: He has a normal mood and affect. His behavior is normal. Judgment and thought content normal.          Assessment & Plan:

## 2016-10-11 LAB — BMP8+ANION GAP
Anion Gap: 17 mmol/L (ref 10.0–18.0)
BUN/Creatinine Ratio: 11 (ref 10–24)
BUN: 20 mg/dL (ref 8–27)
CO2: 24 mmol/L (ref 18–29)
Calcium: 9.4 mg/dL (ref 8.6–10.2)
Chloride: 106 mmol/L (ref 96–106)
Creatinine, Ser: 1.78 mg/dL — ABNORMAL HIGH (ref 0.76–1.27)
GFR calc Af Amer: 43 mL/min/{1.73_m2} — ABNORMAL LOW (ref >59)
GFR calc non Af Amer: 37 mL/min/{1.73_m2} — ABNORMAL LOW (ref >59)
Glucose: 148 mg/dL — ABNORMAL HIGH (ref 65–99)
Potassium: 3.7 mmol/L (ref 3.5–5.2)
Sodium: 147 mmol/L — ABNORMAL HIGH (ref 134–144)

## 2016-10-11 LAB — CBC
Hematocrit: 38.2 % (ref 37.5–51.0)
Hemoglobin: 12.6 g/dL — ABNORMAL LOW (ref 13.0–17.7)
MCH: 30.7 pg (ref 26.6–33.0)
MCHC: 33 g/dL (ref 31.5–35.7)
MCV: 93 fL (ref 79–97)
Platelets: 158 10*3/uL (ref 150–379)
RBC: 4.11 x10E6/uL — ABNORMAL LOW (ref 4.14–5.80)
RDW: 13.1 % (ref 12.3–15.4)
WBC: 8 10*3/uL (ref 3.4–10.8)

## 2016-10-11 LAB — MICROALBUMIN / CREATININE URINE RATIO
Creatinine, Urine: 98.6 mg/dL
Microalb/Creat Ratio: 34.2 mg/g creat — ABNORMAL HIGH (ref 0.0–30.0)
Microalbumin, Urine: 33.7 ug/mL

## 2016-10-14 ENCOUNTER — Telehealth: Payer: Self-pay | Admitting: *Deleted

## 2016-10-14 NOTE — Telephone Encounter (Signed)
CALLED PATIENT , LEFT VOICE MESSAGE REGARDING PATIENT EYE APPOINTMENT WITH DR [facility] April 19.2018 TO ARRIVE 12:45 PM. [phone], CALL OFFICE NEED TO CHANGE THIS APPOINTMENT.  APPOINTMENT ALSO MAILED TO PATIENT.

## 2016-10-15 ENCOUNTER — Other Ambulatory Visit: Payer: Self-pay | Admitting: Internal Medicine

## 2016-10-15 DIAGNOSIS — I10 Essential (primary) hypertension: Secondary | ICD-10-CM

## 2016-10-18 ENCOUNTER — Encounter: Payer: Medicare Other | Admitting: Internal Medicine

## 2016-10-18 ENCOUNTER — Encounter: Payer: Medicare Other | Admitting: Dietician

## 2016-10-21 ENCOUNTER — Encounter: Payer: Self-pay | Admitting: Dietician

## 2016-10-21 ENCOUNTER — Telehealth: Payer: Self-pay | Admitting: Dietician

## 2016-10-21 NOTE — Telephone Encounter (Signed)
Patient canceled follow up for CGM download #1. Called and left message for him to call back/reschedule.

## 2016-10-22 NOTE — Telephone Encounter (Signed)
Took it off the night we put it on. He says he could not sleep with it on; wrestled for hours and it had his arm nub. He went to sleep right after he took it off. No appointment scheduled at this time. I told him his insurance may not cover the cost unless we have 3 days data and that I would let Dr. Lynnae January know what happened.

## 2016-10-22 NOTE — Telephone Encounter (Signed)
It was worth a try.

## 2016-10-31 LAB — HM DIABETES EYE EXAM

## 2016-11-01 ENCOUNTER — Other Ambulatory Visit: Payer: Self-pay | Admitting: Internal Medicine

## 2016-11-08 DIAGNOSIS — E785 Hyperlipidemia, unspecified: Secondary | ICD-10-CM | POA: Diagnosis not present

## 2016-11-08 DIAGNOSIS — Z6833 Body mass index (BMI) 33.0-33.9, adult: Secondary | ICD-10-CM | POA: Diagnosis not present

## 2016-11-08 DIAGNOSIS — N183 Chronic kidney disease, stage 3 (moderate): Secondary | ICD-10-CM | POA: Diagnosis not present

## 2016-11-08 DIAGNOSIS — N2581 Secondary hyperparathyroidism of renal origin: Secondary | ICD-10-CM | POA: Diagnosis not present

## 2016-11-08 DIAGNOSIS — E1122 Type 2 diabetes mellitus with diabetic chronic kidney disease: Secondary | ICD-10-CM | POA: Diagnosis not present

## 2016-11-22 NOTE — Addendum Note (Signed)
Addended by: Hulan Fray on: 11/22/2016 07:53 AM   Modules accepted: Orders

## 2016-11-27 ENCOUNTER — Encounter: Payer: Self-pay | Admitting: *Deleted

## 2016-11-29 ENCOUNTER — Encounter: Payer: Self-pay | Admitting: Internal Medicine

## 2016-11-29 ENCOUNTER — Ambulatory Visit (INDEPENDENT_AMBULATORY_CARE_PROVIDER_SITE_OTHER): Payer: Medicare Other | Admitting: Internal Medicine

## 2016-11-29 VITALS — BP 134/46 | HR 62 | Temp 98.6°F | Ht 66.0 in | Wt 209.8 lb

## 2016-11-29 DIAGNOSIS — I70209 Unspecified atherosclerosis of native arteries of extremities, unspecified extremity: Secondary | ICD-10-CM

## 2016-11-29 DIAGNOSIS — Z79899 Other long term (current) drug therapy: Secondary | ICD-10-CM

## 2016-11-29 DIAGNOSIS — Z87891 Personal history of nicotine dependence: Secondary | ICD-10-CM

## 2016-11-29 DIAGNOSIS — Z8719 Personal history of other diseases of the digestive system: Secondary | ICD-10-CM

## 2016-11-29 DIAGNOSIS — E1129 Type 2 diabetes mellitus with other diabetic kidney complication: Secondary | ICD-10-CM | POA: Diagnosis not present

## 2016-11-29 DIAGNOSIS — N289 Disorder of kidney and ureter, unspecified: Secondary | ICD-10-CM

## 2016-11-29 DIAGNOSIS — I70219 Atherosclerosis of native arteries of extremities with intermittent claudication, unspecified extremity: Secondary | ICD-10-CM | POA: Diagnosis not present

## 2016-11-29 DIAGNOSIS — Z794 Long term (current) use of insulin: Secondary | ICD-10-CM

## 2016-11-29 DIAGNOSIS — K5792 Diverticulitis of intestine, part unspecified, without perforation or abscess without bleeding: Secondary | ICD-10-CM | POA: Diagnosis not present

## 2016-11-29 DIAGNOSIS — Z7984 Long term (current) use of oral hypoglycemic drugs: Secondary | ICD-10-CM

## 2016-11-29 DIAGNOSIS — E1121 Type 2 diabetes mellitus with diabetic nephropathy: Secondary | ICD-10-CM

## 2016-11-29 MED ORDER — CIPROFLOXACIN HCL 500 MG PO TABS: 500.0000 mg | ORAL_TABLET | Freq: Two times a day (BID) | ORAL | 0 refills | Status: AC

## 2016-11-29 MED ORDER — GLIPIZIDE 5 MG PO TABS: 5.0000 mg | ORAL_TABLET | Freq: Two times a day (BID) | ORAL | 3 refills | Status: DC

## 2016-11-29 MED ORDER — PRAVASTATIN SODIUM 40 MG PO TABS: 40.0000 mg | ORAL_TABLET | Freq: Every evening | ORAL | 3 refills | Status: DC

## 2016-11-29 NOTE — Patient Instructions (Addendum)
Cut down on your glipizide one tablet (81m) twice a day.  Take cipro for 5 days.  We will try to schedule the colonoscopy.   Follow up in 3 months.

## 2016-11-29 NOTE — Assessment & Plan Note (Addendum)
Last hgba1c 5.7, worrisome for nocturnal hypoglycemia but he is not symptomatic (on bblocker). Did not tolerate CGM. Home meter reading showing 88-180 range, averaging around 140. On glipizide 67m bid now.  Given his risk of hypoglycemia, I will reduce his glipizide to 5 mg bid. We have some room based on his hgba1c and sugar readings in case his sugar starts going up with this reduction but avoiding the hypoglycemias would be more important now.  F/up in 3 months.  Refilled pravastatin

## 2016-11-29 NOTE — Progress Notes (Signed)
Internal Medicine Clinic Attending  Case discussed with Dr. Genene Churn at the time of the visit.  We reviewed the resident's history and exam and pertinent patient test results.  I agree with the assessment, diagnosis, and plan of care documented in the resident's note.

## 2016-11-29 NOTE — Assessment & Plan Note (Signed)
Possible diverticulitis, likely very mild. Patient has hx of recurrent diverticulitis, last CT scan on 2010 showed possible diverticulitis. Went to see a Psychologist, sport and exercise 3 years ago and they did not want to do any surgery as they felt he may not have true recurrent diverticulitis. He feels that he is having his pain from diverticulitis. Ideally, I would like to get CT abdomen with IV and oral contrast but his last Crt was 1.7 so I don't want to risk nephrotoxicity with contrast given his sx are very mild. Discussed all the risk and benefits and the fact that this may not be true diverticulitis with the patient. He feels that he would benefit from abx as he did in the past.  My suspicion is low that his pain is coming from diverticulitis without fever, severe tenderness, without bowel changes. However, after discussing with the patient, he feels strongly that it is and prefers abx therapy.  Will do trial of cipro 522m bid x5 days.  Will do csope to rule out other causes such as mass or inflammation.

## 2016-11-29 NOTE — Assessment & Plan Note (Signed)
Doing well. Refilled pravastatin.

## 2016-11-29 NOTE — Progress Notes (Signed)
   CC: abd pain  HPI:  [name] is a 71 y.o. with pmh as listed below is here with abd pain and DM II f/up   Past Medical History:  Diagnosis Date  . Anemia 10/18/2014   Baseline about 12 and stable from 2010 to 2016. Colon 2009 in [place] (records cannot be obtained).  EGD Dr Benson Norway 2011 nl   . Chronic diastolic heart failure ([place]) 10/18/2014   Noted ECHO 10/2014. Grade 2. EF 50-55%   . Chronic renal failure, stage 3 (moderate) 10/18/2014   Baseline Cr about 1.5. Stable from 2010 to 2016.   Marland Kitchen Coronary artery disease   . Diverticulosis 10/08/2014   Seen on CT. Reportedly on Colon in [place] in 2009. Freq bouts of diverticulitis.  . DM (diabetes mellitus), type 2 with renal complications ([place]) 11/06/1745  . Essential hypertension 10/09/2014   Poor control with 5 drug therapy.   . Former tobacco use 10/08/2014  . Gout   . OSA on CPAP 10/08/2014  . Pulmonary HTN ([place]) 10/18/2014   Noted as severe ECHO 2016. Likely 2/2 severe OSA.    abd pain - has been going on for 2 months, had 2 episodes of vomitting in this time period, no fever, no diarrhea or constipation or blood in the stool. Has been eating and drinking ok, no other sx.   DM II - last visit he was becoming hypoglycemic, last hgba1c 5.7, BG at home as low as 71, but ranged 75-225. Dr. Lynnae January suspected more hypoglycemias nocturnally and placed CGM. He was on levemir 20 units + glipizide 30m bid in the past but has not been taking levemir for last several months. He was not able to wear the CGM, took it off the same night it was put on due to discomfort. His home sugar reading has been 88-180 mostly, once 222, averaging around 140. No hypoglycemias reported.    Review of Systems:   Review of Systems  Constitutional: Negative for chills, fever and weight loss.  Cardiovascular: Negative for chest pain and palpitations.  Gastrointestinal: Positive for abdominal pain. Negative for blood in stool, constipation, diarrhea, heartburn, melena, nausea and  vomiting.  Neurological: Negative for dizziness and headaches.     Physical Exam:  Vitals:   11/29/16 1010  BP: (!) 134/46  Pulse: 62  Temp: 98.6 F (37 C)  TempSrc: Oral  SpO2: 100%  Weight: 209 lb 12.8 oz (95.2 kg)  Height: _0  (1.676 m)   Physical Exam  Constitutional: He is oriented to person, place, and time. He appears well-developed and well-nourished.  HENT:  Head: Normocephalic and atraumatic.  Cardiovascular: Normal rate and regular rhythm.  Exam reveals no gallop and no friction rub.   No murmur heard. Respiratory: Effort normal. No respiratory distress. He has no wheezes.  GI:  Mild abdominal tenderness on LLL. No rebound, no distension.   Neurological: He is alert and oriented to person, place, and time.    Assessment & Plan:   See Encounters Tab for problem based charting.  Patient discussed with Dr. VEvette Doffing

## 2016-12-19 ENCOUNTER — Other Ambulatory Visit: Payer: Self-pay | Admitting: Internal Medicine

## 2016-12-20 ENCOUNTER — Encounter: Payer: Self-pay | Admitting: *Deleted

## 2016-12-25 DIAGNOSIS — R140 Abdominal distension (gaseous): Secondary | ICD-10-CM | POA: Diagnosis not present

## 2016-12-25 DIAGNOSIS — K5732 Diverticulitis of large intestine without perforation or abscess without bleeding: Secondary | ICD-10-CM | POA: Diagnosis not present

## 2016-12-25 DIAGNOSIS — K5900 Constipation, unspecified: Secondary | ICD-10-CM | POA: Diagnosis not present

## 2017-01-07 DIAGNOSIS — H25013 Cortical age-related cataract, bilateral: Secondary | ICD-10-CM | POA: Diagnosis not present

## 2017-01-07 DIAGNOSIS — H2512 Age-related nuclear cataract, left eye: Secondary | ICD-10-CM | POA: Diagnosis not present

## 2017-01-07 DIAGNOSIS — H2513 Age-related nuclear cataract, bilateral: Secondary | ICD-10-CM | POA: Diagnosis not present

## 2017-01-07 DIAGNOSIS — H18413 Arcus senilis, bilateral: Secondary | ICD-10-CM | POA: Diagnosis not present

## 2017-01-07 DIAGNOSIS — H02839 Dermatochalasis of unspecified eye, unspecified eyelid: Secondary | ICD-10-CM | POA: Diagnosis not present

## 2017-01-15 ENCOUNTER — Encounter: Payer: Self-pay | Admitting: Family

## 2017-01-15 ENCOUNTER — Other Ambulatory Visit: Payer: Self-pay | Admitting: Internal Medicine

## 2017-01-15 MED ORDER — LABETALOL HCL 200 MG PO TABS: 400.0000 mg | ORAL_TABLET | Freq: Two times a day (BID) | ORAL | 3 refills | Status: DC

## 2017-01-15 NOTE — Telephone Encounter (Signed)
PATIENT NEEDS REFILL ON LABETATOL 200MG SENT TO [facility] [phone]

## 2017-01-21 DIAGNOSIS — H401132 Primary open-angle glaucoma, bilateral, moderate stage: Secondary | ICD-10-CM | POA: Diagnosis not present

## 2017-01-24 ENCOUNTER — Ambulatory Visit (INDEPENDENT_AMBULATORY_CARE_PROVIDER_SITE_OTHER): Payer: Medicare Other | Admitting: Family

## 2017-01-24 ENCOUNTER — Ambulatory Visit (HOSPITAL_COMMUNITY)
Admission: RE | Admit: 2017-01-24 | Discharge: 2017-01-24 | Disposition: A | Discharge status: Home / Self Care | Payer: Medicare Other | Source: Ambulatory Visit | Attending: Vascular Surgery | Admitting: Vascular Surgery

## 2017-01-24 ENCOUNTER — Encounter: Payer: Self-pay | Admitting: Family

## 2017-01-24 VITALS — BP 144/57 | HR 68 | Temp 98.8°F | Resp 24 | Ht 66.0 in | Wt 206.2 lb

## 2017-01-24 DIAGNOSIS — I70213 Atherosclerosis of native arteries of extremities with intermittent claudication, bilateral legs: Secondary | ICD-10-CM

## 2017-01-24 DIAGNOSIS — I739 Peripheral vascular disease, unspecified: Secondary | ICD-10-CM | POA: Insufficient documentation

## 2017-01-24 DIAGNOSIS — Z87891 Personal history of nicotine dependence: Secondary | ICD-10-CM | POA: Diagnosis not present

## 2017-01-24 DIAGNOSIS — I70219 Atherosclerosis of native arteries of extremities with intermittent claudication, unspecified extremity: Secondary | ICD-10-CM | POA: Diagnosis not present

## 2017-01-24 DIAGNOSIS — I779 Disorder of arteries and arterioles, unspecified: Secondary | ICD-10-CM

## 2017-01-24 DIAGNOSIS — R9439 Abnormal result of other cardiovascular function study: Secondary | ICD-10-CM | POA: Insufficient documentation

## 2017-01-24 NOTE — Progress Notes (Signed)
[facility] [place]   CC: Follow up peripheral artery occlusive disease  History of Present Illness Dylan [name] is a 71 y.o. male patient of Dr. Bridgett Larsson who presents with chief complaint: pain in feet. The patient's symptoms have not progressed. The patient's symptoms were: foot pain with walking, relieved by treatment of gout. Cramping in calves resolved, occasional left hip pain noted with walking. The patient's treatment regimen currently includes: maximal medical management and walking plan.  He was diagnosed with gout in his feet about November 2017, was started on allopurinol and colchicine; severe pain in his feet limited his walking until he was started on the above medications; since then he has been walking more and his pain in feet is relieved.   He denies any known history of stroke or TIA.   He states he has always felt hot. His TSH in November 2016 was normal at 1.03 (review of records)  Pt Diabetic: Yes, 5.7 A1C on 10-10-16 (review of records) Pt smoker: former smoker, quit in 1974  Pt meds include: Statin :Yes Betablocker: Yes ASA: Yes Other anticoagulants/antiplatelets: no     Past Medical History:  Diagnosis Date  . Anemia 10/18/2014   Baseline about 12 and stable from 2010 to 2016. Colon 2009 in [place] (records cannot be obtained).  EGD Dr Benson [place] 2011 nl   . Chronic diastolic heart failure ([place]) 10/18/2014   Noted ECHO 10/2014. Grade 2. EF 50-55%   . Chronic renal failure, stage 3 (moderate) 10/18/2014   Baseline Cr about 1.5. Stable from 2010 to 2016.   Marland Kitchen Coronary artery disease   . Diverticulosis 10/08/2014   Seen on CT. Reportedly on Colon in [place] in 2009. Freq bouts of diverticulitis.  . DM (diabetes mellitus), type 2 with renal complications ([place]) 07/13/3844  . Essential hypertension 10/09/2014   Poor control with 5 drug therapy.   . Former tobacco use 10/08/2014  . Gout   . OSA on CPAP 10/08/2014  . Pulmonary HTN ([place]) 10/18/2014   Noted as severe  ECHO 2016. Likely 2/2 severe OSA.    Social History Social History  Substance Use Topics  . Smoking status: Former Smoker    Quit date: 10/02/1972  . Smokeless tobacco: Never Used  . Alcohol use No    Family History Family History  Problem Relation Age of Onset  . Heart failure Sister        Died of complications Nov 6599  . CAD Sister     Past Surgical History:  Procedure Laterality Date  . CHOLECYSTECTOMY      Allergies  Allergen Reactions  . Spironolactone Other (See Comments)    Gynecomastia per pt report.    Current Outpatient Prescriptions  Medication Sig Dispense Refill  . albuterol (PROVENTIL HFA;VENTOLIN HFA) 108 (90 BASE) MCG/ACT inhaler Inhale 1-2 puffs into the lungs every 6 (six) hours as needed for wheezing or shortness of breath. 3 Inhaler 0  . allopurinol (ZYLOPRIM) 100 MG tablet Take 1 tablet (100 mg total) by mouth daily. 90 tablet 3  . alum & mag hydroxide-simeth (MAALOX/MYLANTA) 200-200-20 MG/5ML suspension Take 30 mLs by mouth every 6 (six) hours as needed for indigestion or heartburn (dyspepsia). 355 mL 0  . aspirin 81 MG chewable tablet Chew 1 tablet (81 mg total) by mouth daily. 90 tablet 3  . colchicine 0.6 MG tablet TAKE ONE TABLET BY MOUTH ONCE DAILY 90 tablet 1  . docusate sodium (COLACE) 100 MG capsule Take 1 capsule (100 mg  total) by mouth every 12 (twelve) hours. 60 capsule 0  . eplerenone (INSPRA) 50 MG tablet TAKE ONE TABLET BY MOUTH ONCE DAILY 90 tablet 3  . FREESTYLE LITE test strip Check blood sugar before meals and bedtime as instructed 100 each 12  . glipiZIDE (GLUCOTROL) 5 MG tablet Take 1 tablet (5 mg total) by mouth 2 (two) times daily before a meal. 360 tablet 3  . hydrALAZINE (APRESOLINE) 50 MG tablet TAKE ONE TABLET BY MOUTH EVERY 8 HOURS 270 tablet 3  . ibuprofen (ADVIL) 200 MG tablet Take 2 tablets (400 mg total) by mouth every 12 (twelve) hours as needed. 100 tablet 2  . ketotifen (ZADITOR) 0.025 % ophthalmic solution Place 1  drop into both eyes 2 (two) times daily.    Marland Kitchen labetalol (NORMODYNE) 200 MG tablet Take 2 tablets (400 mg total) by mouth 2 (two) times daily. 360 tablet 3  . minoxidil (LONITEN) 10 MG tablet TAKE TWO TABLETS BY MOUTH ONCE DAILY 180 tablet 3  . pravastatin (PRAVACHOL) 40 MG tablet Take 1 tablet (40 mg total) by mouth every evening. 90 tablet 3  . torsemide (DEMADEX) 10 MG tablet Take 2 in morning and 1 in afternoon 270 tablet 3  . triamcinolone (NASACORT AQ) 55 MCG/ACT AERO nasal inhaler Place 2 sprays into the nose daily. 1 Inhaler 2  . verapamil (VERELAN PM) 360 MG 24 hr capsule TAKE ONE CAPSULE BY MOUTH ONCE DAILY 90 capsule 3   No current facility-administered medications for this visit.     ROS: See HPI for pertinent positives and negatives.   Physical Examination  Vitals:   01/24/17 1311 01/24/17 1320  BP: (!) 141/58 (!) 144/57  Pulse: 68   Resp: (!) 24   Temp: 98.8 F (37.1 C)   TempSrc: Oral   SpO2: 94%   Weight: 206 lb 3.2 oz (93.5 kg)   Height: _0  (1.676 m)    Body mass index is 33.28 kg/m.   General: A&O x 3, WDWN, obese male  Eyes:  PERRLA, bilateral exophthalmos  Pulmonary: Sym exp, respirations are non labored, good air movt, CTAB, no rales, rhonchi, or wheezing  Cardiac: RRR, Nl S1, S2, no Murmurs, rubs or gallops        VASCULAR EXAM:      Extremities without ischemic changes,      without Gangrene; without open wounds.  Vessel Right Left  Radial Palpable Palpable  Brachial Palpable Palpable  Carotid Palpable, without bruit Palpable, without bruit  Aorta Not palpable N/A  Femoral Palpable Palpable  Popliteal Not palpable Not palpable  PT Not Palpable Not Palpable  DP Not Palpable Not Palpable   Gastrointestinal: soft, NTND, no G/R, no HSM, asymptomatic reducible ventral hernia, no CVAT B  Musculoskeletal: M/S 5/5 throughout, extremities without ischemic changes        Skin: no rashes, no ulcers noted, warm to touch.        Neurologic: A&O X 3; Appropriate Affect ; SENSATION: normal; MOTOR FUNCTION:  moving all extremities equally, motor strength 5/5 throughout. Speech is          fluent/normal. CN 2-12 intact.    ASSESSMENT:  Dylan [name] is a 71 y.o. male who no longer has pain in his feet with walking since he was started on gout treatment with allopurinol and colchicine. He is walking more, does not seem to have claudication symptoms despite moderate arterial occlusive disease by ABI's in both legs.  There are no signs of ischemia in  his feet/legs.    His atherosclerotic risk factors include currently well controlled DM, former smoker, hypertension (on 4 antihypertensive medications and 2 diuretics), Stage 3 CKD, and obesity.    DATA  ABI (Date: 01/24/2017)  R:   ABI: 0.54 (was 0.61 on 07-12-16),   PT: mono (was mono)  DP: mono (was mono)  TBI:  0.44 (was 0.44)  L:   ABI: 0.77 (was0.73),   PT: bi (was mono)  DP: bi (was mono)  TBI: 0.48 (was 0.40) Stable on the right with moderate arterial occlusive disease (monophasic waveforms); slightly improved on the left from mono to biphasic waveforms at 0.77, moderate arterial occlusive disease.    PLAN:   Based on the patient's vascular studies and examination, I have offered the patient: return in 33 months with ABI's.  I advised him to notify us if he develops concerns re the circulation in his feet or legs.   Graduated walking program discussed and how to achieve.  I discussed in depth with the patient the nature of atherosclerosis, and emphasized the importance of maximal medical management including strict control of blood pressure, blood glucose, and lipid levels, obtaining regular exercise, and continued cessation of smoking.  The patient is aware that without maximal medical management the underlying atherosclerotic disease process will progress, limiting the benefit of any interventions.  The patient was given information about PAD  including signs, symptoms, treatment, what symptoms should prompt the patient to seek immediate medical care, and risk reduction measures to take.  Clemon Chambers, RN, MSN, FNP-C [facility] Phone: [phone]  Clinic MD: Donzetta Matters  01/24/17 1:27 PM

## 2017-01-24 NOTE — Progress Notes (Signed)
Vitals:   01/24/17 1311 01/24/17 1320  BP: (!) 141/58 (!) 144/57  Pulse: 68   Resp: (!) 24   Temp: 98.8 F (37.1 C)   TempSrc: Oral   SpO2: 94%   Weight: 206 lb 3.2 oz (93.5 kg)   Height: 5' 6" (1.676 m)

## 2017-01-24 NOTE — Patient Instructions (Signed)
Peripheral Vascular Disease Peripheral vascular disease (PVD) is a disease of the blood vessels that are not part of your heart and brain. A simple term for PVD is poor circulation. In most cases, PVD narrows the blood vessels that carry blood from your heart to the rest of your body. This can result in a decreased supply of blood to your arms, legs, and internal organs, like your stomach or kidneys. However, it most often affects a person's lower legs and feet. There are two types of PVD.  Organic PVD. This is the more common type. It is caused by damage to the structure of blood vessels.  Functional PVD. This is caused by conditions that make blood vessels contract and tighten (spasm).  Without treatment, PVD tends to get worse over time. PVD can also lead to acute ischemic limb. This is when an arm or limb suddenly has trouble getting enough blood. This is a medical emergency. Follow these instructions at home:  Take medicines only as told by your doctor.  Do not use any tobacco products, including cigarettes, chewing tobacco, or electronic cigarettes. If you need help quitting, ask your doctor.  Lose weight if you are overweight, and maintain a healthy weight as told by your doctor.  Eat a diet that is low in fat and cholesterol. If you need help, ask your doctor.  Exercise regularly. Ask your doctor for some good activities for you.  Take good care of your feet. ? Wear comfortable shoes that fit well. ? Check your feet often for any cuts or sores. Contact a doctor if:  You have cramps in your legs while walking.  You have leg pain when you are at rest.  You have coldness in a leg or foot.  Your skin changes.  You are unable to get or have an erection (erectile dysfunction).  You have cuts or sores on your feet that are not healing. Get help right away if:  Your arm or leg turns cold and blue.  Your arms or legs become red, warm, swollen, painful, or numb.  You have  chest pain or trouble breathing.  You suddenly have weakness in your face, arm, or leg.  You become very confused or you cannot speak.  You suddenly have a very bad headache.  You suddenly cannot see. This information is not intended to replace advice given to you by your health care provider. Make sure you discuss any questions you have with your health care provider. Document Released: 09/18/2009 Document Revised: 11/30/2015 Document Reviewed: 12/02/2013 Elsevier Interactive Patient Education  2017 [employer].

## 2017-02-07 NOTE — Addendum Note (Signed)
Addended by: Lianne Cure A on: 02/07/2017 09:13 AM   Modules accepted: Orders

## 2017-02-14 ENCOUNTER — Other Ambulatory Visit: Payer: Self-pay | Admitting: Internal Medicine

## 2017-02-14 NOTE — Telephone Encounter (Signed)
NEEDS REFILL,VERAPAMIL [place], [place]

## 2017-02-15 ENCOUNTER — Other Ambulatory Visit: Payer: Self-pay | Admitting: Internal Medicine

## 2017-02-15 DIAGNOSIS — N183 Chronic kidney disease, stage 3 unspecified: Secondary | ICD-10-CM

## 2017-02-15 DIAGNOSIS — Z794 Long term (current) use of insulin: Secondary | ICD-10-CM

## 2017-02-15 DIAGNOSIS — E1122 Type 2 diabetes mellitus with diabetic chronic kidney disease: Secondary | ICD-10-CM

## 2017-02-17 MED ORDER — VERAPAMIL HCL ER 360 MG PO CP24: 360.0000 mg | ORAL_CAPSULE | Freq: Every day | ORAL | 3 refills | Status: DC

## 2017-02-17 NOTE — Telephone Encounter (Signed)
FREESTYLE LITE test strip, REFILL REQUEST

## 2017-03-03 DIAGNOSIS — H2512 Age-related nuclear cataract, left eye: Secondary | ICD-10-CM | POA: Diagnosis not present

## 2017-03-06 ENCOUNTER — Ambulatory Visit (INDEPENDENT_AMBULATORY_CARE_PROVIDER_SITE_OTHER): Payer: Medicare Other | Admitting: Internal Medicine

## 2017-03-06 ENCOUNTER — Encounter: Payer: Self-pay | Admitting: Internal Medicine

## 2017-03-06 VITALS — BP 164/63 | HR 66 | Temp 98.7°F | Ht 66.0 in | Wt 207.0 lb

## 2017-03-06 DIAGNOSIS — Z7982 Long term (current) use of aspirin: Secondary | ICD-10-CM | POA: Diagnosis not present

## 2017-03-06 DIAGNOSIS — Z9989 Dependence on other enabling machines and devices: Secondary | ICD-10-CM | POA: Diagnosis not present

## 2017-03-06 DIAGNOSIS — H0520 Unspecified exophthalmos: Secondary | ICD-10-CM | POA: Diagnosis not present

## 2017-03-06 DIAGNOSIS — E1122 Type 2 diabetes mellitus with diabetic chronic kidney disease: Secondary | ICD-10-CM

## 2017-03-06 DIAGNOSIS — N183 Chronic kidney disease, stage 3 unspecified: Secondary | ICD-10-CM

## 2017-03-06 DIAGNOSIS — Z23 Encounter for immunization: Secondary | ICD-10-CM

## 2017-03-06 DIAGNOSIS — Z7984 Long term (current) use of oral hypoglycemic drugs: Secondary | ICD-10-CM

## 2017-03-06 DIAGNOSIS — E11319 Type 2 diabetes mellitus with unspecified diabetic retinopathy without macular edema: Secondary | ICD-10-CM

## 2017-03-06 DIAGNOSIS — R0609 Other forms of dyspnea: Secondary | ICD-10-CM

## 2017-03-06 DIAGNOSIS — R0981 Nasal congestion: Secondary | ICD-10-CM

## 2017-03-06 DIAGNOSIS — Z79899 Other long term (current) drug therapy: Secondary | ICD-10-CM

## 2017-03-06 DIAGNOSIS — E1121 Type 2 diabetes mellitus with diabetic nephropathy: Secondary | ICD-10-CM

## 2017-03-06 DIAGNOSIS — R011 Cardiac murmur, unspecified: Secondary | ICD-10-CM

## 2017-03-06 DIAGNOSIS — M1000 Idiopathic gout, unspecified site: Secondary | ICD-10-CM | POA: Diagnosis not present

## 2017-03-06 DIAGNOSIS — L819 Disorder of pigmentation, unspecified: Secondary | ICD-10-CM

## 2017-03-06 DIAGNOSIS — Z87891 Personal history of nicotine dependence: Secondary | ICD-10-CM | POA: Diagnosis not present

## 2017-03-06 DIAGNOSIS — G4733 Obstructive sleep apnea (adult) (pediatric): Secondary | ICD-10-CM

## 2017-03-06 DIAGNOSIS — I152 Hypertension secondary to endocrine disorders: Secondary | ICD-10-CM

## 2017-03-06 DIAGNOSIS — Z9842 Cataract extraction status, left eye: Secondary | ICD-10-CM

## 2017-03-06 DIAGNOSIS — M1A9XX0 Chronic gout, unspecified, without tophus (tophi): Secondary | ICD-10-CM

## 2017-03-06 DIAGNOSIS — E1159 Type 2 diabetes mellitus with other circulatory complications: Secondary | ICD-10-CM

## 2017-03-06 DIAGNOSIS — Z794 Long term (current) use of insulin: Secondary | ICD-10-CM | POA: Diagnosis not present

## 2017-03-06 DIAGNOSIS — Z0000 Encounter for general adult medical examination without abnormal findings: Secondary | ICD-10-CM

## 2017-03-06 DIAGNOSIS — H578 Other specified disorders of eye and adnexa: Secondary | ICD-10-CM

## 2017-03-06 DIAGNOSIS — I129 Hypertensive chronic kidney disease with stage 1 through stage 4 chronic kidney disease, or unspecified chronic kidney disease: Secondary | ICD-10-CM

## 2017-03-06 DIAGNOSIS — I10 Essential (primary) hypertension: Secondary | ICD-10-CM

## 2017-03-06 LAB — POCT GLYCOSYLATED HEMOGLOBIN (HGB A1C): Hemoglobin A1C: 5.2

## 2017-03-06 LAB — GLUCOSE, CAPILLARY: Glucose-Capillary: 108 mg/dL — ABNORMAL HIGH (ref 65–99)

## 2017-03-06 MED ORDER — HYDRALAZINE HCL 50 MG PO TABS: 100.0000 mg | ORAL_TABLET | Freq: Three times a day (TID) | ORAL | 3 refills | Status: DC

## 2017-03-06 NOTE — Assessment & Plan Note (Signed)
This problem is chronic and stable. His A1c trend has been 5.9 - 5.7 - 5.2. He has not been taking Levemir for about 5 months but remains on his glipizide 5 mg BID. Since he stopped the insulin, he has not had any more hypoglycemia. He states his lowest sugar has not been below 100. I was concerned about the accuracy of his A1c, due to his chronic renal insufficiency and symptoms of hypoglycemia, and attempted a continuous glucose monitor in April of this year but he was not able to tolerate it. I am pleased that he is no longer having hypoglycemia symptoms and sugars less than 100. I have no great way to ensure he is not having hyperglycemia as he was not able to tolerate the continuous glucose monitor. He denies hypoglycemia on fingerstick at home. With his age and comorbidities, I do not feel that pushing another continuous glucose monitor on him will be beneficial.  PLAN:  Cont current meds

## 2017-03-06 NOTE — Patient Instructions (Signed)
1. Please change your hydralazine to 2 pills three times day 2. If blood pressure doesn't get lower than 130/80 within 4 weeks, please make an appt 3. Otherwise see me in 3 months

## 2017-03-06 NOTE — Assessment & Plan Note (Signed)
This problem is chronic and uncontrolled. He was able to find his CPAP machine. He used the first night but notes that his blood pressure was 948 systolic the subsequent morning. He uses a second night and again his systolic blood pressure the subsequent morning was 180. He hasn't used it then because it was increasing his BP instead of lowering it. He stated that the mask was loose and air was blowing around the mask.  PLAN : see if company can go to home and assess

## 2017-03-06 NOTE — Assessment & Plan Note (Signed)
He agreed to get his flu shot today.  He was also concerned about his thyroid due to his proptosis. He had thyroid checked twice in 2016 and had an ocular CT scan in 2017 all of which were normal. I am okay with rechecking TSH today.

## 2017-03-06 NOTE — Assessment & Plan Note (Signed)
This problem is chronic and stable. His diabetes is as well controlled as I am able to determine due to limitations inc an inaccurate A1c and inability to do continuous glucose monitoring. He continues to follow with his ophthalmologist.  PLAN : cont to follow

## 2017-03-06 NOTE — Assessment & Plan Note (Addendum)
This problem is chronic and stable. He has had no gout flares recently. He is on colchicine 0.6 daily and allopurinol 100 daily. Rheumatology had checked his uric acid in October 2016 and it was 7.1. I do not have any subsequent uric acid level since then. I have not been able to get records other than what is already scanned into Epic. I am checking his uric acid level today and if at goal, I will make the assumption that he has had adequate prophylaxis and stop the colchicine while continuing the allopurinol.  PLAN : uric acid level today   Uric acid level increased 7-8. Called pt. Now stated stopped colchiceine 4-5 months ago. I cannot increase allo without prophylaxis. Since no subsequent attacks, leave meds as is.

## 2017-03-06 NOTE — Assessment & Plan Note (Signed)
This problem is chronic and stable. It is to 2/2 hypertension and diabetes. I will recheck a creatinine today.  PLAN : BMP Cont to control RF

## 2017-03-06 NOTE — Progress Notes (Signed)
   Subjective:    Patient ID: Dylan [name], male    DOB: May 30, 1946, 71 y.o.   MRN: [id number]  HPI  Dylan [name] is here for Dm F/U. Please see the A&P for the status of the pt's chronic medical problems.  ROS : per ROS section and in problem oriented charting. All other systems are negative.  PMHx, Soc hx, and / or Fam hx : Recent L cataract removal, can see well now, some soreness, able to drive still.   Review of Systems  Constitutional: Negative for unexpected weight change.  HENT: Positive for congestion. Negative for rhinorrhea and sneezing.   Eyes: Positive for itching.       Dry eyes  Respiratory: Negative for shortness of breath.        +DOE  Cardiovascular: Positive for leg swelling.  Endocrine:       No hypoglycemia sxs       Objective:   Physical Exam  Constitutional: He appears well-developed and well-nourished. No distress.  HENT:  Head: Atraumatic.  Right Ear: External ear normal.  Left Ear: External ear normal.  Nose: Nose normal.  Hyperpigmented spot R inner pinna, chronic per pt  Cardiovascular: Normal rate, regular rhythm and normal heart sounds.   2/6 early systolic murmur  Pulmonary/Chest: Effort normal and breath sounds normal. No respiratory distress. He has no wheezes. He has no rales.  Musculoskeletal: Normal range of motion. He exhibits edema. He exhibits no tenderness.  + pitting edema medial malleolus B. No pre-tibial edema  Neurological: He is alert.  Skin: Skin is warm and dry. No rash noted. He is not diaphoretic. No erythema.  Psychiatric: He has a normal mood and affect. His behavior is normal. Judgment and thought content normal.      Assessment & Plan:

## 2017-03-06 NOTE — Assessment & Plan Note (Signed)
This problem is chronic and stable. He is on hydral 100 BID, labetalol 400 BID, verap 360, minoxidil 20, Demadex 20 AM 10 PM, epleronone 50. His BP at home is 660-630'Z systolic. He is have no SE to meds. At his last appt, I documented the confusion surrounding his hydral. He was to be on 50 Q8 but had only been taking 50 QD. He then self increased to 100 AM and PM. At that appt, I increased to 010 BID. He is still uncontrolled.   PLAN : increase hydral to 100 Q 8

## 2017-03-07 ENCOUNTER — Other Ambulatory Visit: Payer: Self-pay | Admitting: Internal Medicine

## 2017-03-07 DIAGNOSIS — M1000 Idiopathic gout, unspecified site: Secondary | ICD-10-CM

## 2017-03-07 LAB — BMP8+ANION GAP
Anion Gap: 16 mmol/L (ref 10.0–18.0)
BUN/Creatinine Ratio: 12 (ref 10–24)
BUN: 18 mg/dL (ref 8–27)
CO2: 22 mmol/L (ref 20–29)
Calcium: 8.8 mg/dL (ref 8.6–10.2)
Chloride: 109 mmol/L — ABNORMAL HIGH (ref 96–106)
Creatinine, Ser: 1.5 mg/dL — ABNORMAL HIGH (ref 0.76–1.27)
GFR calc Af Amer: 53 mL/min/{1.73_m2} — ABNORMAL LOW (ref >59)
GFR calc non Af Amer: 46 mL/min/{1.73_m2} — ABNORMAL LOW (ref >59)
Glucose: 107 mg/dL — ABNORMAL HIGH (ref 65–99)
Potassium: 3.5 mmol/L (ref 3.5–5.2)
Sodium: 147 mmol/L — ABNORMAL HIGH (ref 134–144)

## 2017-03-07 LAB — TSH: TSH: 0.8 u[IU]/mL (ref 0.450–4.500)

## 2017-03-07 LAB — URIC ACID: Uric Acid: 8.4 mg/dL (ref 3.7–8.6)

## 2017-03-21 ENCOUNTER — Emergency Department (HOSPITAL_COMMUNITY)
Admission: EM | Admit: 2017-03-21 | Discharge: 2017-03-21 | Disposition: A | Discharge status: Home / Self Care | Payer: Medicare Other | Attending: Emergency Medicine | Admitting: Emergency Medicine

## 2017-03-21 ENCOUNTER — Emergency Department (HOSPITAL_COMMUNITY): Payer: Medicare Other

## 2017-03-21 ENCOUNTER — Encounter (HOSPITAL_COMMUNITY): Payer: Self-pay

## 2017-03-21 DIAGNOSIS — I110 Hypertensive heart disease with heart failure: Secondary | ICD-10-CM | POA: Diagnosis not present

## 2017-03-21 DIAGNOSIS — R55 Syncope and collapse: Secondary | ICD-10-CM | POA: Diagnosis not present

## 2017-03-21 DIAGNOSIS — I2510 Atherosclerotic heart disease of native coronary artery without angina pectoris: Secondary | ICD-10-CM | POA: Insufficient documentation

## 2017-03-21 DIAGNOSIS — Z7984 Long term (current) use of oral hypoglycemic drugs: Secondary | ICD-10-CM | POA: Diagnosis not present

## 2017-03-21 DIAGNOSIS — I129 Hypertensive chronic kidney disease with stage 1 through stage 4 chronic kidney disease, or unspecified chronic kidney disease: Secondary | ICD-10-CM | POA: Diagnosis not present

## 2017-03-21 DIAGNOSIS — E1122 Type 2 diabetes mellitus with diabetic chronic kidney disease: Secondary | ICD-10-CM | POA: Diagnosis not present

## 2017-03-21 DIAGNOSIS — E860 Dehydration: Secondary | ICD-10-CM | POA: Diagnosis not present

## 2017-03-21 DIAGNOSIS — Z7982 Long term (current) use of aspirin: Secondary | ICD-10-CM | POA: Insufficient documentation

## 2017-03-21 DIAGNOSIS — R5383 Other fatigue: Secondary | ICD-10-CM | POA: Diagnosis not present

## 2017-03-21 DIAGNOSIS — I5032 Chronic diastolic (congestive) heart failure: Secondary | ICD-10-CM | POA: Insufficient documentation

## 2017-03-21 DIAGNOSIS — Z87891 Personal history of nicotine dependence: Secondary | ICD-10-CM | POA: Insufficient documentation

## 2017-03-21 DIAGNOSIS — N183 Chronic kidney disease, stage 3 (moderate): Secondary | ICD-10-CM | POA: Insufficient documentation

## 2017-03-21 DIAGNOSIS — E876 Hypokalemia: Secondary | ICD-10-CM | POA: Diagnosis not present

## 2017-03-21 DIAGNOSIS — N179 Acute kidney failure, unspecified: Secondary | ICD-10-CM | POA: Diagnosis not present

## 2017-03-21 LAB — BASIC METABOLIC PANEL
Anion gap: 12 (ref 5–15)
BUN: 37 mg/dL — ABNORMAL HIGH (ref 6–20)
CO2: 24 mmol/L (ref 22–32)
Calcium: 9.2 mg/dL (ref 8.9–10.3)
Chloride: 102 mmol/L (ref 101–111)
Creatinine, Ser: 2.6 mg/dL — ABNORMAL HIGH (ref 0.61–1.24)
GFR calc Af Amer: 27 mL/min — ABNORMAL LOW (ref >60)
GFR calc non Af Amer: 23 mL/min — ABNORMAL LOW (ref >60)
Glucose, Bld: 195 mg/dL — ABNORMAL HIGH (ref 65–99)
Potassium: 2.9 mmol/L — ABNORMAL LOW (ref 3.5–5.1)
Sodium: 138 mmol/L (ref 135–145)

## 2017-03-21 LAB — CBC
HCT: 43.4 % (ref 39.0–52.0)
Hemoglobin: 14.1 g/dL (ref 13.0–17.0)
MCH: 29.9 pg (ref 26.0–34.0)
MCHC: 32.5 g/dL (ref 30.0–36.0)
MCV: 91.9 fL (ref 78.0–100.0)
Platelets: 178 10*3/uL (ref 150–400)
RBC: 4.72 MIL/uL (ref 4.22–5.81)
RDW: 13.3 % (ref 11.5–15.5)
WBC: 9.1 10*3/uL (ref 4.0–10.5)

## 2017-03-21 LAB — URINALYSIS, ROUTINE W REFLEX MICROSCOPIC
Bilirubin Urine: NEGATIVE
Glucose, UA: NEGATIVE mg/dL
Hgb urine dipstick: NEGATIVE
Ketones, ur: NEGATIVE mg/dL
Leukocytes, UA: NEGATIVE
Nitrite: NEGATIVE
Protein, ur: 100 mg/dL — AB
Specific Gravity, Urine: 1.017 (ref 1.005–1.030)
pH: 5 (ref 5.0–8.0)

## 2017-03-21 LAB — CBG MONITORING, ED: Glucose-Capillary: 203 mg/dL — ABNORMAL HIGH (ref 65–99)

## 2017-03-21 MED ORDER — SODIUM CHLORIDE 0.9 % IV BOLUS (SEPSIS)
1000.0000 mL | Freq: Once | INTRAVENOUS | Status: AC
  Administered 2017-03-21: 1000 mL via INTRAVENOUS

## 2017-03-21 MED ORDER — POTASSIUM CHLORIDE 10 MEQ/100ML IV SOLN
10.0000 meq | Freq: Once | INTRAVENOUS | Status: AC
  Administered 2017-03-21: 10 meq via INTRAVENOUS
  Filled 2017-03-21: qty 100

## 2017-03-21 MED ORDER — POTASSIUM CHLORIDE CRYS ER 20 MEQ PO TBCR
40.0000 meq | EXTENDED_RELEASE_TABLET | Freq: Once | ORAL | Status: AC
  Administered 2017-03-21: 40 meq via ORAL
  Filled 2017-03-21: qty 2

## 2017-03-21 MED ORDER — POTASSIUM CHLORIDE ER 10 MEQ PO TBCR: 20.0000 meq | EXTENDED_RELEASE_TABLET | Freq: Two times a day (BID) | ORAL | 0 refills | Status: DC

## 2017-03-21 NOTE — ED Notes (Signed)
Patient verbalized understanding of discharge instructions and denies any further needs or questions at this time. VS stable. Patient ambulatory with steady gait.

## 2017-03-21 NOTE — ED Notes (Signed)
Pt states he feels back to his normal self

## 2017-03-21 NOTE — ED Triage Notes (Signed)
Pt presents to the ed for a near syncope episode. Pt has been battling blood pressure problems for awhile now and today it was very low so his family brought him in. Pt denies any pain and reports normal blood sugars at home prior to coming to the ed

## 2017-03-21 NOTE — ED Notes (Signed)
Patient transported to CT 

## 2017-03-21 NOTE — ED Provider Notes (Signed)
[facility] DEPT Provider Note   CSN: [id number] Arrival date & time: 03/21/17  1513     History   Chief Complaint Chief Complaint  Patient presents with  . Near Syncope    HPI Dylan [name] is a 71 y.o. male.  HPI   Presents with concern for  830AM jumped into car, went to [employer], was feeling dizzy, lightheaded.  Was tehre for about 1.5hr walking around, waiting for pharmacy, was feeling lightheaded.  Went home, around 12PM took medication and at 1PM started feeling worse.  Called doctor, IM doctor, told him to come to the ED.  Checked blood pressures and found them to be 998-338 systolic. Concerned that was too low, then checked it and found it to be 250 systolic.  Denies numbness, weakness, facial droop, trouble talking. No fevers, no diarrhea or vomiting. No black or bloody stools. Denies urinary symptoms. Had some back pain while sitting in waiting room that resolved.   Past Medical History:  Diagnosis Date  . Anemia 10/18/2014   Baseline about 12 and stable from 2010 to 2016. Colon 2009 in [place] (records cannot be obtained).  EGD Dr Benson Norway 2011 nl   . Chronic diastolic heart failure ([place]) 10/18/2014   Noted ECHO 10/2014. Grade 2. EF 50-55%   . Chronic renal failure, stage 3 (moderate) 10/18/2014   Baseline Cr about 1.5. Stable from 2010 to 2016.   Marland Kitchen Coronary artery disease   . Diverticulosis 10/08/2014   Seen on CT. Reportedly on Colon in [place] in 2009. Freq bouts of diverticulitis.  . DM (diabetes mellitus), type 2 with renal complications ([place]) 11/07/9765  . Essential hypertension 10/09/2014   Poor control with 5 drug therapy.   . Former tobacco use 10/08/2014  . Gout   . OSA on CPAP 10/08/2014  . Pulmonary HTN ([place]) 10/18/2014   Noted as severe ECHO 2016. Likely 2/2 severe OSA.    Patient Active Problem List   Diagnosis Date Noted  . Thrombocytopenia ([place]) 10/09/2016  . Allergic rhinitis 06/15/2015  . 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 Atherosclerosis of native arteries of extremity  with intermittent claudication ([place]) 12/28/2014  . Healthcare maintenance 12/12/2014  . Diabetic retinopathy ([place]) 11/08/2014  . Gout 10/19/2014  . Stage 3 chronic renal impairment associated with type 2 diabetes mellitus ([place]) 10/18/2014  . Anemia 10/18/2014  . Obesity 10/18/2014  . Pulmonary HTN ([place]) 10/18/2014  . Chronic diastolic heart failure ([place]) 10/18/2014  . Hypertension associated with diabetes ([place]) 10/09/2014  . OSA treated with BiPAP 10/08/2014  . Former tobacco use 10/08/2014  . Diverticulosis 10/08/2014  . DM (diabetes mellitus), type 2 with renal complications ([place]) 34/19/3790    Past Surgical History:  Procedure Laterality Date  . CHOLECYSTECTOMY         Home Medications    Prior to Admission medications   Medication Sig Start Date End Date Taking? Authorizing Provider  albuterol (PROVENTIL HFA;VENTOLIN HFA) 108 (90 BASE) MCG/ACT inhaler Inhale 1-2 puffs into the lungs every 6 (six) hours as needed for wheezing or shortness of breath. 12/08/14  Yes Bartholomew Crews, MD  allopurinol (ZYLOPRIM) 100 MG tablet Take 1 tablet (100 mg total) by mouth daily. 07/11/16 07/11/17 Yes Bartholomew Crews, MD  aspirin 81 MG chewable tablet Chew 1 tablet (81 mg total) by mouth daily. 06/15/15  Yes Riccardo Dubin, MD  colchicine 0.6 MG tablet TAKE ONE TABLET BY MOUTH ONCE DAILY Patient taking differently: TAKE 0.6 TABLET BY MOUTH ONCE DAILY 11/04/16  Yes  Bartholomew Crews, MD  docusate sodium (COLACE) 100 MG capsule Take 1 capsule (100 mg total) by mouth every 12 (twelve) hours. 08/05/15  Yes Pfeiffer, Jeannie Done, MD  eplerenone (INSPRA) 50 MG tablet TAKE ONE TABLET BY MOUTH ONCE DAILY Patient taking differently: TAKE 50 mg TABLET BY MOUTH ONCE DAILY 12/19/16  Yes Bartholomew Crews, MD  glipiZIDE (GLUCOTROL) 5 MG tablet Take 1 tablet (5 mg total) by mouth 2 (two) times daily before a meal. 11/29/16  Yes Ahmed, Chesley Mires, MD  hydrALAZINE (APRESOLINE) 50 MG tablet Take 2 tablets (100 mg  total) by mouth 3 (three) times daily. 03/06/17  Yes Bartholomew Crews, MD  ketotifen (ZADITOR) 0.025 % ophthalmic solution Place 1 drop into both eyes 2 (two) times daily.   Yes [provider]  labetalol (NORMODYNE) 200 MG tablet Take 2 tablets (400 mg total) by mouth 2 (two) times daily. 01/15/17  Yes Bartholomew Crews, MD  minoxidil (LONITEN) 10 MG tablet TAKE TWO TABLETS BY MOUTH ONCE DAILY Patient taking differently: TAKE 20 mg TABLETS BY MOUTH ONCE DAILY 10/16/16  Yes Bartholomew Crews, MD  pravastatin (PRAVACHOL) 40 MG tablet Take 1 tablet (40 mg total) by mouth every evening. 11/29/16 11/29/17 Yes Ahmed, Chesley Mires, MD  torsemide (DEMADEX) 10 MG tablet Take 2 in morning and 1 in afternoon 07/11/16  Yes Bartholomew Crews, MD  triamcinolone (NASACORT AQ) 55 MCG/ACT AERO nasal inhaler Place 2 sprays into the nose daily. 10/10/16 10/10/17 Yes Bartholomew Crews, MD  verapamil (VERELAN PM) 360 MG 24 hr capsule Take 1 capsule (360 mg total) by mouth daily. 02/17/17  Yes Bartholomew Crews, MD  alum & mag hydroxide-simeth (MAALOX/MYLANTA) 200-200-20 MG/5ML suspension Take 30 mLs by mouth every 6 (six) hours as needed for indigestion or heartburn (dyspepsia). Patient not taking: Reported on 03/21/2017 10/13/14   Cristal Ford, DO  FREESTYLE LITE test strip CHECK BLOOD SUGAR BEFORE MEALS AND BEDTIME AS DIRECTED Patient not taking: Reported on 03/21/2017 02/17/17   Bartholomew Crews, MD  potassium chloride (K-DUR) 10 MEQ tablet Take 2 tablets (20 mEq total) by mouth 2 (two) times daily. 03/21/17 03/24/17  Gareth Morgan, MD    Family History Family History  Problem Relation Age of Onset  . Heart failure Sister        Died of complications Nov 0354  . CAD Sister     Social History Social History  Substance Use Topics  . Smoking status: Former Smoker    Quit date: 10/02/1972  . Smokeless tobacco: Never Used  . Alcohol use No     Allergies   Spironolactone   Review of  Systems Review of Systems  Constitutional: Positive for fatigue. Negative for fever.  HENT: Negative for sore throat.   Eyes: Negative for visual disturbance.  Respiratory: Negative for shortness of breath.   Cardiovascular: Negative for chest pain.  Gastrointestinal: Negative for abdominal pain, diarrhea, nausea and vomiting.  Genitourinary: Negative for difficulty urinating.  Musculoskeletal: Positive for back pain (middle of back). Negative for neck stiffness.  Skin: Negative for rash.  Neurological: Positive for light-headedness and headaches. Negative for syncope, facial asymmetry, speech difficulty, weakness and numbness.     Physical Exam Updated Vital Signs BP 119/60 (BP Location: Left Arm)   Pulse 79   Temp 97.8 F (36.6 C) (Oral)   Resp 16   SpO2 97%   Physical Exam  Constitutional: He is oriented to person, place, and time. He appears well-developed and well-nourished. No distress.  HENT:  Head: Normocephalic and atraumatic.  Eyes: Conjunctivae and EOM are normal.  Neck: Normal range of motion.  Cardiovascular: Normal rate, regular rhythm, normal heart sounds and intact distal pulses.  Exam reveals no gallop and no friction rub.   No murmur heard. Pulmonary/Chest: Effort normal and breath sounds normal. No respiratory distress. He has no wheezes. He has no rales.  Abdominal: Soft. He exhibits no distension. There is no tenderness. There is no guarding.  Musculoskeletal: He exhibits no edema.  Neurological: He is alert and oriented to person, place, and time. He has normal strength. No cranial nerve deficit or sensory deficit. Coordination normal. GCS eye subscore is 4. GCS verbal subscore is 5. GCS motor subscore is 6.  Skin: Skin is warm and dry. He is not diaphoretic.  Nursing note and vitals reviewed.    ED Treatments / Results  Labs (all labs ordered are listed, but only abnormal results are displayed) Labs Reviewed  BASIC METABOLIC PANEL - Abnormal;  Notable for the following:       Result Value   Potassium 2.9 (*)    Glucose, Bld 195 (*)    BUN 37 (*)    Creatinine, Ser 2.60 (*)    GFR calc non Af Amer 23 (*)    GFR calc Af Amer 27 (*)    All other components within normal limits  URINALYSIS, ROUTINE W REFLEX MICROSCOPIC - Abnormal; Notable for the following:    Color, Urine AMBER (*)    APPearance HAZY (*)    Protein, ur 100 (*)    Bacteria, UA RARE (*)    Squamous Epithelial / LPF 0-5 (*)    All other components within normal limits  CBG MONITORING, ED - Abnormal; Notable for the following:    Glucose-Capillary 203 (*)    All other components within normal limits  CBC    EKG  EKG Interpretation  Date/Time:  Friday March 21 2017 15:27:23 EDT Ventricular Rate:  78 PR Interval:  222 QRS Duration: 82 QT Interval:  446 QTC Calculation: 508 R Axis:   -49 Text Interpretation:  Sinus rhythm with 1st degree A-V block with Premature atrial complexes Left anterior fascicular block Nonspecific ST abnormality Prolonged QT Abnormal ECG No significant change since last tracing Confirmed by Gareth Morgan [id number]) on 03/21/2017 6:10:06 PM       Radiology Ct Head Wo Contrast  Result Date: 03/21/2017 CLINICAL DATA:  Syncope EXAM: CT HEAD WITHOUT CONTRAST TECHNIQUE: Contiguous axial images were obtained from the base of the skull through the vertex without intravenous contrast. COMPARISON:  October 11, 2014 FINDINGS: Brain: Mild diffuse atrophy is stable. There is no intracranial mass, hemorrhage, extra-axial fluid collection, or midline shift. There is slight small vessel disease in the centra semiovale bilaterally, stable. Elsewhere gray-white compartments appear normal. No evident acute infarct. Vascular: There is no appreciable hyperdense vessel. There are foci of calcification in each carotid siphon region as well as in both distal vertebral arteries. Skull: The bony calvarium appears intact. Sinuses/Orbits: There is mucosal  thickening in several ethmoid air cells bilaterally. There is a chronic retention cyst in a posterior right ethmoid air cell. Other paranasal sinuses are clear. There is a defect in the medial right orbital wall, chronic and stable, either congenital or residua of old trauma. Orbits otherwise appear symmetric bilaterally. There is evidence of prior cataract removal on the left. Other: Mastoid air cells are clear. IMPRESSION: Stable mild atrophy with mild periventricular small vessel disease. No intracranial  mass, hemorrhage, or extra-axial fluid collection. No evident acute infarct. Areas of arterial vascular calcification noted. Areas of paranasal sinus disease. Electronically Signed   By: Lowella Grip III M.D.   On: 03/21/2017 20:29    Procedures Procedures (including critical care time)  Medications Ordered in ED Medications  sodium chloride 0.9 % bolus 1,000 mL (0 mLs Intravenous Stopped 03/21/17 2158)  potassium chloride SA (K-DUR,KLOR-CON) CR tablet 40 mEq (40 mEq Oral Given 03/21/17 2028)  potassium chloride 10 mEq in 100 mL IVPB (0 mEq Intravenous Stopped 03/21/17 2141)     Initial Impression / Assessment and Plan / ED Course  I have reviewed the triage vital signs and the nursing notes.  Pertinent labs & imaging results that were available during my care of the patient were reviewed by me and considered in my medical decision making (see chart for details).     71yo male with history of hypertension, hyperlipidemia, CHF, DM, CAD, CKD, presents with concern for lightheadedness, fatigue.  No focal neurologic findings on history or exam, doubt CVA or TIA.  Labs show no significant anemia. EKG without acute findings and no sign of arrhythmia on telemetry monitoring.  Reports headache also today, head CT without acute findings. Urinalysis without signs of UTI. No chest pain or dyspnea to suggest ACS or PE.  Labs significant for hypokalemia and acute on chronic kidney disease. Given po  and IV potassium, and bolus of IV fluid for hydration. Suspect dehydration and hypokalemia contributing to symptoms of fatigue, generalized weakness, lightheadedness.  Recommend avoiding NSAIDs. Gave rx for potassium. Recommend follow up with PCP for recheck of renal function and potassium. Patient discharged in stable condition with understanding of reasons to return.   Final Clinical Impressions(s) / ED Diagnoses   Final diagnoses:  Near syncope  Other fatigue  Hypokalemia  Acute kidney injury ([place])  Dehydration    New Prescriptions Discharge Medication List as of 03/21/2017 11:33 PM    START taking these medications   Details  potassium chloride (K-DUR) 10 MEQ tablet Take 2 tablets (20 mEq total) by mouth 2 (two) times daily., Starting Fri 03/21/2017, Until Mon 03/24/2017, Print         Gareth Morgan, MD 03/22/17 [phone]

## 2017-03-23 ENCOUNTER — Other Ambulatory Visit: Payer: Self-pay | Admitting: Internal Medicine

## 2017-03-23 DIAGNOSIS — N183 Chronic kidney disease, stage 3 unspecified: Secondary | ICD-10-CM

## 2017-03-23 DIAGNOSIS — E1122 Type 2 diabetes mellitus with diabetic chronic kidney disease: Secondary | ICD-10-CM

## 2017-03-23 NOTE — Progress Notes (Signed)
Pls call pt and request that he come in next 2 weeks for lab draw. Orders in. ED Cr worse and I want to reccheck along with K since gave KCal with poor frenal fxn. thanks

## 2017-03-24 ENCOUNTER — Telehealth: Payer: Self-pay | Admitting: *Deleted

## 2017-03-24 NOTE — Telephone Encounter (Signed)
Need lab appt (per Dr. Lynnae January) before 04/04/17, called patient but no answer. Phone kept ringing. pls try calling patient again. Thanks!

## 2017-03-25 NOTE — Progress Notes (Signed)
done

## 2017-03-31 ENCOUNTER — Ambulatory Visit (INDEPENDENT_AMBULATORY_CARE_PROVIDER_SITE_OTHER): Payer: Medicare Other | Admitting: Internal Medicine

## 2017-03-31 ENCOUNTER — Encounter: Payer: Self-pay | Admitting: Internal Medicine

## 2017-03-31 VITALS — BP 155/69 | HR 70 | Temp 97.9°F | Wt 198.3 lb

## 2017-03-31 DIAGNOSIS — I2510 Atherosclerotic heart disease of native coronary artery without angina pectoris: Secondary | ICD-10-CM

## 2017-03-31 DIAGNOSIS — R42 Dizziness and giddiness: Secondary | ICD-10-CM | POA: Diagnosis not present

## 2017-03-31 DIAGNOSIS — Z5189 Encounter for other specified aftercare: Secondary | ICD-10-CM

## 2017-03-31 DIAGNOSIS — I10 Essential (primary) hypertension: Secondary | ICD-10-CM

## 2017-03-31 DIAGNOSIS — R011 Cardiac murmur, unspecified: Secondary | ICD-10-CM | POA: Diagnosis not present

## 2017-03-31 DIAGNOSIS — Z87891 Personal history of nicotine dependence: Secondary | ICD-10-CM | POA: Diagnosis not present

## 2017-03-31 DIAGNOSIS — I152 Hypertension secondary to endocrine disorders: Secondary | ICD-10-CM

## 2017-03-31 DIAGNOSIS — M109 Gout, unspecified: Secondary | ICD-10-CM

## 2017-03-31 DIAGNOSIS — I5032 Chronic diastolic (congestive) heart failure: Secondary | ICD-10-CM | POA: Diagnosis not present

## 2017-03-31 DIAGNOSIS — Z7982 Long term (current) use of aspirin: Secondary | ICD-10-CM | POA: Diagnosis not present

## 2017-03-31 DIAGNOSIS — E1122 Type 2 diabetes mellitus with diabetic chronic kidney disease: Secondary | ICD-10-CM | POA: Diagnosis not present

## 2017-03-31 DIAGNOSIS — E785 Hyperlipidemia, unspecified: Secondary | ICD-10-CM | POA: Diagnosis not present

## 2017-03-31 DIAGNOSIS — E1159 Type 2 diabetes mellitus with other circulatory complications: Secondary | ICD-10-CM

## 2017-03-31 DIAGNOSIS — N179 Acute kidney failure, unspecified: Secondary | ICD-10-CM | POA: Diagnosis not present

## 2017-03-31 DIAGNOSIS — I130 Hypertensive heart and chronic kidney disease with heart failure and stage 1 through stage 4 chronic kidney disease, or unspecified chronic kidney disease: Secondary | ICD-10-CM | POA: Diagnosis not present

## 2017-03-31 DIAGNOSIS — N183 Chronic kidney disease, stage 3 (moderate): Secondary | ICD-10-CM

## 2017-03-31 DIAGNOSIS — E876 Hypokalemia: Secondary | ICD-10-CM

## 2017-03-31 NOTE — Progress Notes (Signed)
   CC: hypokalemia  HPI:  [name] is a 71 y.o. with a PMH of HTN, HLD, CHF, T2DM, CAD, CKD presenting to clinic for ED f/u for hypokalemia, AKI and dizziness.  Patient states that on 9/14 he had dizziness and fatigue which led him to visit the ED; w/u was significant for AKI and hypokalemia to 2.9, otherwise CBC, UA, CT head were unremarkable. Patient states that he has been eating and drinking ok, has not had recent illness and has been compliant with all of his meds. He endorses constipation which led him to take castor oil prior to his episode of dizziness which resulted in 3+ episodes of diarrhea; he has since stopped taking castor oil. He also endorses taking ~6 Aleve day before due to mild gout flare which has resolved. Patient states that since the ED visit he has not been able to pick up the potassium prescription given to him. He endorses feeling back to normal. He denies chest pain, shortness of breath, headache, vision or hearing changes, dizziness, unsteady gait, LE edema, appetite loss, hematuria, dysuria, frothy urine, N/V.  Please see problem based Assessment and Plan for status of patients chronic conditions.  Past Medical History:  Diagnosis Date  . Anemia 10/18/2014   Baseline about 12 and stable from 2010 to 2016. Colon 2009 in [place] (records cannot be obtained).  EGD Dr Benson Norway 2011 nl   . Chronic diastolic heart failure ([place]) 10/18/2014   Noted ECHO 10/2014. Grade 2. EF 50-55%   . Chronic renal failure, stage 3 (moderate) 10/18/2014   Baseline Cr about 1.5. Stable from 2010 to 2016.   Marland Kitchen Coronary artery disease   . Diverticulosis 10/08/2014   Seen on CT. Reportedly on Colon in [place] in 2009. Freq bouts of diverticulitis.  . DM (diabetes mellitus), type 2 with renal complications ([place]) 11/07/8411  . Essential hypertension 10/09/2014   Poor control with 5 drug therapy.   . Former tobacco use 10/08/2014  . Gout   . OSA on CPAP 10/08/2014  . Pulmonary HTN ([place]) 10/18/2014   Noted as severe  ECHO 2016. Likely 2/2 severe OSA.    Review of Systems:   ROS Per HPI  Physical Exam:  Vitals:   03/31/17 0830  BP: (!) 155/69  Pulse: 70  Temp: 97.9 F (36.6 C)  TempSrc: Oral  SpO2: 96%  Weight: 198 lb 4.8 oz (89.9 kg)   GENERAL- alert, co-operative, appears as stated age, not in any distress. HEENT- Atraumatic, normocephalic, PERRL, EOMI, oral mucosa appears moist CARDIAC- RRR, systolic murmur, no rubs or gallops. RESP- Moving equal volumes of air, and clear to auscultation bilaterally, no wheezes or crackles. ABDOMEN- Soft, nontender, bowel sounds present. NEURO- No Cr N abnormality, strength and sensation intact throughout. No arm drift.Marland Kitchen EXTREMITIES- pulse 2+ radial, symmetric, no LE edema. SKIN- Warm, dry, no rash or lesion. PSYCH- Normal mood and affect, appropriate thought content and speech.  Assessment & Plan:   See Encounters Tab for problem based charting.   Patient discussed with Dr. Abelardo Diesel, MD Internal Medicine PGY2

## 2017-03-31 NOTE — Assessment & Plan Note (Signed)
Patient with recent AKI with Cr 2.6, up from baseline of 1.5-1.8, likely 2/2 volume depletion from diarrhea. He has accompanying hypokalemia.  Plan: --f/u Bmet, mg

## 2017-03-31 NOTE — Patient Instructions (Signed)
I will call you about the results of your blood work later today and we will discuss whether you need another prescription for potassium.

## 2017-03-31 NOTE — Progress Notes (Signed)
Internal Medicine Clinic Attending  Case discussed with Dr. Svalina  at the time of the visit.  We reviewed the resident's history and exam and pertinent patient test results.  I agree with the assessment, diagnosis, and plan of care documented in the resident's note.  

## 2017-03-31 NOTE — Assessment & Plan Note (Signed)
BP elevated today, however patient states he has not taken any of his AM medicines yet. He reports compliance otherwise. He is asymptomatic.  Plan: --continue current regimen

## 2017-03-31 NOTE — Assessment & Plan Note (Signed)
Resolved; in setting of diarrhea and AKI. No focal neuro symptoms or signs today.

## 2017-03-31 NOTE — Assessment & Plan Note (Signed)
Patient with documented K of 2.9 at recent ED visit in setting of diarrhea and AKI. He has not picked up K supplementation prescribed to him.  Plan: --f/u Bmet, magnesium --if needs repletion will be careful in setting of eplerenone

## 2017-04-01 ENCOUNTER — Other Ambulatory Visit (INDEPENDENT_AMBULATORY_CARE_PROVIDER_SITE_OTHER): Payer: Medicare Other

## 2017-04-01 DIAGNOSIS — E876 Hypokalemia: Secondary | ICD-10-CM

## 2017-04-01 LAB — MAGNESIUM: Magnesium: 1.7 mg/dL (ref 1.7–2.4)

## 2017-04-01 LAB — BASIC METABOLIC PANEL
Anion gap: 8 (ref 5–15)
BUN: 18 mg/dL (ref 6–20)
CO2: 28 mmol/L (ref 22–32)
Calcium: 8.8 mg/dL — ABNORMAL LOW (ref 8.9–10.3)
Chloride: 106 mmol/L (ref 101–111)
Creatinine, Ser: 1.55 mg/dL — ABNORMAL HIGH (ref 0.61–1.24)
GFR calc Af Amer: 50 mL/min — ABNORMAL LOW (ref >60)
GFR calc non Af Amer: 43 mL/min — ABNORMAL LOW (ref >60)
Glucose, Bld: 184 mg/dL — ABNORMAL HIGH (ref 65–99)
Potassium: 3.1 mmol/L — ABNORMAL LOW (ref 3.5–5.1)
Sodium: 142 mmol/L (ref 135–145)

## 2017-04-01 MED ORDER — POTASSIUM CHLORIDE ER 20 MEQ PO TBCR: 20.0000 meq | EXTENDED_RELEASE_TABLET | Freq: Every day | ORAL | 0 refills | Status: DC

## 2017-04-01 NOTE — Addendum Note (Signed)
Addended by: Alphonzo Grieve on: 04/01/2017 02:29 PM   Modules accepted: Orders

## 2017-04-01 NOTE — Progress Notes (Signed)
Patient's magnesium is normal, and his potassium is still decreased to 3.1. Will have him supplement with Kdur 34m daily x5 (he just picked up 3 days worth from his ED visit, so I have sent in 2 extra days.)  Patient in agreement with plan and will come back on Tuesday (earliest time he can make it in), for repeat Bmet to ensure K repletion.  [name] MD IMTS - PGY2 Pager [phone]

## 2017-04-01 NOTE — Addendum Note (Signed)
Addended by: Truddie Crumble on: 04/01/2017 09:47 AM   Modules accepted: Orders

## 2017-04-03 ENCOUNTER — Other Ambulatory Visit: Payer: Medicare Other

## 2017-04-08 ENCOUNTER — Other Ambulatory Visit (INDEPENDENT_AMBULATORY_CARE_PROVIDER_SITE_OTHER): Payer: Medicare Other

## 2017-04-08 DIAGNOSIS — E876 Hypokalemia: Secondary | ICD-10-CM

## 2017-04-09 LAB — BMP8+ANION GAP
Anion Gap: 14 mmol/L (ref 10.0–18.0)
BUN/Creatinine Ratio: 12 (ref 10–24)
BUN: 20 mg/dL (ref 8–27)
CO2: 25 mmol/L (ref 20–29)
Calcium: 8.7 mg/dL (ref 8.6–10.2)
Chloride: 106 mmol/L (ref 96–106)
Creatinine, Ser: 1.62 mg/dL — ABNORMAL HIGH (ref 0.76–1.27)
GFR calc Af Amer: 48 mL/min/{1.73_m2} — ABNORMAL LOW (ref >59)
GFR calc non Af Amer: 42 mL/min/{1.73_m2} — ABNORMAL LOW (ref >59)
Glucose: 208 mg/dL — ABNORMAL HIGH (ref 65–99)
Potassium: 3.8 mmol/L (ref 3.5–5.2)
Sodium: 145 mmol/L — ABNORMAL HIGH (ref 134–144)

## 2017-06-03 ENCOUNTER — Ambulatory Visit (INDEPENDENT_AMBULATORY_CARE_PROVIDER_SITE_OTHER): Payer: Medicare Other | Admitting: Internal Medicine

## 2017-06-03 ENCOUNTER — Other Ambulatory Visit: Payer: Self-pay

## 2017-06-03 DIAGNOSIS — J309 Allergic rhinitis, unspecified: Secondary | ICD-10-CM | POA: Diagnosis not present

## 2017-06-03 DIAGNOSIS — Z87891 Personal history of nicotine dependence: Secondary | ICD-10-CM | POA: Diagnosis not present

## 2017-06-03 DIAGNOSIS — H6122 Impacted cerumen, left ear: Secondary | ICD-10-CM

## 2017-06-03 DIAGNOSIS — Z7982 Long term (current) use of aspirin: Secondary | ICD-10-CM

## 2017-06-03 MED ORDER — TRIAMCINOLONE ACETONIDE 55 MCG/ACT NA AERO: 2.0000 | INHALATION_SPRAY | Freq: Every day | NASAL | 2 refills | Status: DC

## 2017-06-03 MED ORDER — LORATADINE 10 MG PO TABS: 10.0000 mg | ORAL_TABLET | Freq: Every day | ORAL | 1 refills | Status: DC

## 2017-06-03 NOTE — Patient Instructions (Addendum)
Mr. Zimmers,  For your allergies I would like you to continue the saline irrigation but I also want to start you back on the nasal spray that Dr. Lynnae January had tried before called Nasacort. Spray into each nostril twice a day. I also want to try an oral medication called loratadine. Take this while the allergy symptoms are bad and you can stop it once things clear up.   FOLLOW-UP INSTRUCTIONS When: 06/12/17 with Dr. Lynnae January For: Regular follow up What to bring: Your medications

## 2017-06-03 NOTE — Progress Notes (Signed)
   CC: Allergies  HPI:  Dylan [name] is a 71 y.o. male with a past medical history listed below here today with complaints of allergies.  He reports that for the past 2-3 days he has had itchy, swollen, watery eyes. He also notes sinus congestion with clear sputum when blowing his nose. Mild cough with scant clear mucous production. No blood. Denies any fevers, chills, sore throat, hearing loss or pain, sinus pressure, rhinorrhea. Reports chronic allergies. Currently only taking saline nasal irrigation. Has previously been prescribed nasacort but reports he has not taken this recently. Does note that his allergies always flare up with the season changes.    Past Medical History:  Diagnosis Date  . Anemia 10/18/2014   Baseline about 12 and stable from 2010 to 2016. Colon 2009 in [place] (records cannot be obtained).  EGD Dr Benson Norway 2011 nl   . Chronic diastolic heart failure ([place]) 10/18/2014   Noted ECHO 10/2014. Grade 2. EF 50-55%   . Chronic renal failure, stage 3 (moderate) ([name]) 10/18/2014   Baseline Cr about 1.5. Stable from 2010 to 2016.   Marland Kitchen Coronary artery disease   . Diverticulosis 10/08/2014   Seen on CT. Reportedly on Colon in [place] in 2009. Freq bouts of diverticulitis.  . DM (diabetes mellitus), type 2 with renal complications ([place]) 02/10/1482  . Essential hypertension 10/09/2014   Poor control with 5 drug therapy.   . Former tobacco use 10/08/2014  . Gout   . OSA on CPAP 10/08/2014  . Pulmonary HTN ([place]) 10/18/2014   Noted as severe ECHO 2016. Likely 2/2 severe OSA.   Review of Systems:   Negative except as noted in HPI  Physical Exam:  Vitals:   06/03/17 1602  BP: (!) 136/55  Pulse: 61  Temp: 98 F (36.7 C)  TempSrc: Oral  SpO2: 94%  Weight: 207 lb 14.4 oz (94.3 kg)  Height: _0  (1.676 m)   Physical Exam  Constitutional: He is oriented to person, place, and time and well-developed, well-nourished, and in no distress. No distress.  HENT:  Head: Normocephalic and atraumatic.    Right Ear: Hearing, tympanic membrane, external ear and ear canal normal.  Left Ear: Hearing, tympanic membrane, external ear and ear canal normal.  Nose: Nose normal.  Mouth/Throat: Oropharyngeal exudate present.  Left ear initially with cerumen impaction, normal examination once removed.   Eyes: EOM are normal. Pupils are equal, round, and reactive to light. Right eye exhibits no discharge and no exudate. Left eye exhibits no discharge and no exudate. Right conjunctiva is injected.  Cardiovascular: Normal rate, regular rhythm and normal heart sounds.  Pulmonary/Chest: Effort normal and breath sounds normal. No respiratory distress. He has no wheezes.  Lymphadenopathy:    He has no cervical adenopathy.  Neurological: He is alert and oriented to person, place, and time.  Skin: Skin is warm.  Psychiatric: Mood and affect normal.  Vitals reviewed.   Assessment & Plan:   See Encounters Tab for problem based charting.  Patient discussed with Dr. Angelia Mould

## 2017-06-04 NOTE — Progress Notes (Signed)
Internal Medicine Clinic Attending  Case discussed with Dr. Boswell at the time of the visit.  We reviewed the resident's history and exam and pertinent patient test results.  I agree with the assessment, diagnosis, and plan of care documented in the resident's note.  

## 2017-06-04 NOTE — Assessment & Plan Note (Signed)
See HPI for details.  A/P Allergic rhinitis Will give new Rx for Nasacort as well as for loratadine. Follow up with PCP next week.

## 2017-06-11 NOTE — Assessment & Plan Note (Signed)
This problem is new. I picked this up on chart review yesterday. His last 2 CBCs showed thrombocytopenia. This started in January 2017. It is only minimally low with the lowest being 139. He is having no bleeding manifestations. I would imagine the most likely etiology is NAFLD as he has significant ABD obesity. His albumin in May 2017 was normal. He had an ultrasound in 2014 that showed "Mildly heterogeneous echotexture". Weight loss would be key to treating this. He is active but his weight is trending up a bit. I did not discuss this today until I get further information.  PLAN : Repeat CBC today Will discuss further interventions at his next appointment

## 2017-06-12 ENCOUNTER — Encounter: Payer: Self-pay | Admitting: Internal Medicine

## 2017-06-12 ENCOUNTER — Ambulatory Visit (INDEPENDENT_AMBULATORY_CARE_PROVIDER_SITE_OTHER): Payer: Medicare Other | Admitting: Internal Medicine

## 2017-06-12 ENCOUNTER — Other Ambulatory Visit: Payer: Self-pay

## 2017-06-12 VITALS — BP 168/68 | HR 95 | Temp 98.5°F | Wt 208.8 lb

## 2017-06-12 DIAGNOSIS — M7989 Other specified soft tissue disorders: Secondary | ICD-10-CM | POA: Diagnosis not present

## 2017-06-12 DIAGNOSIS — Z87891 Personal history of nicotine dependence: Secondary | ICD-10-CM

## 2017-06-12 DIAGNOSIS — Z7984 Long term (current) use of oral hypoglycemic drugs: Secondary | ICD-10-CM | POA: Diagnosis not present

## 2017-06-12 DIAGNOSIS — M1000 Idiopathic gout, unspecified site: Secondary | ICD-10-CM

## 2017-06-12 DIAGNOSIS — Z79899 Other long term (current) drug therapy: Secondary | ICD-10-CM

## 2017-06-12 DIAGNOSIS — J309 Allergic rhinitis, unspecified: Secondary | ICD-10-CM

## 2017-06-12 DIAGNOSIS — N183 Chronic kidney disease, stage 3 unspecified: Secondary | ICD-10-CM

## 2017-06-12 DIAGNOSIS — M1A9XX0 Chronic gout, unspecified, without tophus (tophi): Secondary | ICD-10-CM

## 2017-06-12 DIAGNOSIS — E1122 Type 2 diabetes mellitus with diabetic chronic kidney disease: Secondary | ICD-10-CM

## 2017-06-12 DIAGNOSIS — I10 Essential (primary) hypertension: Secondary | ICD-10-CM

## 2017-06-12 DIAGNOSIS — R011 Cardiac murmur, unspecified: Secondary | ICD-10-CM | POA: Diagnosis not present

## 2017-06-12 DIAGNOSIS — E1121 Type 2 diabetes mellitus with diabetic nephropathy: Secondary | ICD-10-CM

## 2017-06-12 DIAGNOSIS — Z794 Long term (current) use of insulin: Secondary | ICD-10-CM

## 2017-06-12 DIAGNOSIS — R0600 Dyspnea, unspecified: Secondary | ICD-10-CM | POA: Diagnosis not present

## 2017-06-12 DIAGNOSIS — I129 Hypertensive chronic kidney disease with stage 1 through stage 4 chronic kidney disease, or unspecified chronic kidney disease: Secondary | ICD-10-CM | POA: Diagnosis not present

## 2017-06-12 DIAGNOSIS — G4733 Obstructive sleep apnea (adult) (pediatric): Secondary | ICD-10-CM

## 2017-06-12 DIAGNOSIS — I152 Hypertension secondary to endocrine disorders: Secondary | ICD-10-CM

## 2017-06-12 DIAGNOSIS — E1159 Type 2 diabetes mellitus with other circulatory complications: Secondary | ICD-10-CM

## 2017-06-12 LAB — GLUCOSE, CAPILLARY: Glucose-Capillary: 160 mg/dL — ABNORMAL HIGH (ref 65–99)

## 2017-06-12 LAB — POCT GLYCOSYLATED HEMOGLOBIN (HGB A1C): Hemoglobin A1C: 6

## 2017-06-12 MED ORDER — HYDRALAZINE HCL 50 MG PO TABS: 100.0000 mg | ORAL_TABLET | Freq: Three times a day (TID) | ORAL | 3 refills | Status: DC

## 2017-06-12 MED ORDER — LORATADINE 10 MG PO TABS: 10.0000 mg | ORAL_TABLET | Freq: Every day | ORAL | 3 refills | Status: DC

## 2017-06-12 MED ORDER — ALLOPURINOL 100 MG PO TABS: 100.0000 mg | ORAL_TABLET | Freq: Every day | ORAL | 3 refills | Status: DC

## 2017-06-12 NOTE — Assessment & Plan Note (Signed)
This problem is chronic and stable. We visualized and reviewed his creatinine trended together. I emphasized that as long as his creatinine does not increase, that he would be okay and not require renal replacement therapy.  PLAN : cont to follow Better BP control

## 2017-06-12 NOTE — Progress Notes (Signed)
   Subjective:    Patient ID: Dylan [name], male    DOB: 19-Jul-1945, 71 y.o.   MRN: [id number]  HPI  [name] is here for DM F/U. Please see the A&P for the status of the pt's chronic medical problems.  ROS : per ROS section and in problem oriented charting. All other systems are negative.  PMHx, Soc hx, and / or Fam hx : Able to walk to grocery store and back. Has to stop once on way back which is slighty uphill.   Review of Systems  Constitutional: Negative for unexpected weight change.  Respiratory: Positive for cough.        + DOE  Cardiovascular: Positive for leg swelling. Negative for chest pain.  Endocrine:       No hypoglycemic sxs  Musculoskeletal: Negative for arthralgias.       Objective:   Physical Exam  Constitutional: He appears well-developed and well-nourished. No distress.  HENT:  Head: Normocephalic and atraumatic.  Right Ear: External ear normal.  Left Ear: External ear normal.  Nose: Nose normal.  Eyes: Conjunctivae and EOM are normal. Right eye exhibits no discharge. Left eye exhibits no discharge. No scleral icterus.  Cardiovascular: Normal rate and regular rhythm.  Murmur heard. Early 2/6 systolic murmur  Pulmonary/Chest: Effort normal and breath sounds normal. No respiratory distress.  Musculoskeletal: He exhibits edema. He exhibits no tenderness or deformity.  Neurological: He is alert.  Skin: Skin is warm and dry. He is not diaphoretic.  Psychiatric: He has a normal mood and affect. His behavior is normal. Judgment and thought content normal.      Assessment & Plan:

## 2017-06-12 NOTE — Assessment & Plan Note (Signed)
This problem is chronic and uncontrolled. He is on labetalol 400 twice a day, verapamil 360, minoxidil 20 daily, Demadex 20 in the a.m. and 10 in the PM, and eplerenone 50. He is also on hydralazine and was supposed to be taking 100 3 times a day but he is only taking 100 twice a day. He is not yet at goal which would be close to 120/80.  PLAN : Increase hydralazine to 100 3 times a day Continue all other medications unchanged

## 2017-06-12 NOTE — Assessment & Plan Note (Signed)
This problem is chronic and uncontrolled. He did find his old BiPAP machine but does not know the settings. I reviewed his sleep study from 2016 and wrote out his BiPAP pressures of 21 inspiratory and 17 expiratory. I asked him to call office if he is not able to program his machine to the settings.  PLAN : Resume BiPAP at 21 / 17

## 2017-06-12 NOTE — Assessment & Plan Note (Signed)
This problem is chronic and stable. His A1c is 5.7 - 5.2 - 6.0 today. He is on glipizide 5 twice a day. Since stopping insulin, he has had no more hypoglycemia. He checks his CBG twice a day at home, fasting and before bed. He states he had one time last week of 200 but otherwise it is 130 to 160. His A1c is well controlled but might the falsely low due to his renal dysfunction. I tried continuous glucose monitor but he was unable to tolerate it and took it off the first day.  PLAN:  Cont current meds

## 2017-06-12 NOTE — Assessment & Plan Note (Signed)
He is doing much better on the loratadine and steroid nose spray and would like to continue these. He states he gets allergic symptoms year round and was to use the medications year-round.  PLAN : cont meds

## 2017-06-12 NOTE — Patient Instructions (Addendum)
1. Sleep machine settings - BiPAP inspiratory pressure of 21 and expiratory pressure of 17 CWP. 2. Your thyroid and sugar are great 3. See me in 3 months

## 2017-06-12 NOTE — Assessment & Plan Note (Signed)
This problem is chronic and stable. He has had no gout flares recently. He is on allopurinol 100 daily. Unfortunately, his uric acid level never got to goal before he stopped his colchicine in March 2018. His uric acid was 7.8 most recently. As I do not want to increase his allopurinol while he is not on prophylaxis, and because he has had no flares, I am going to continue the allopurinol 100 and follow his clinical course.  PLAN:  Cont current meds

## 2017-07-13 ENCOUNTER — Other Ambulatory Visit: Payer: Self-pay | Admitting: Internal Medicine

## 2017-08-01 ENCOUNTER — Encounter (HOSPITAL_COMMUNITY): Payer: Medicare Other

## 2017-08-01 ENCOUNTER — Ambulatory Visit: Payer: Medicare Other | Admitting: Family

## 2017-08-25 ENCOUNTER — Ambulatory Visit (INDEPENDENT_AMBULATORY_CARE_PROVIDER_SITE_OTHER): Payer: Medicare Other | Admitting: Internal Medicine

## 2017-08-25 ENCOUNTER — Encounter: Payer: Self-pay | Admitting: Internal Medicine

## 2017-08-25 ENCOUNTER — Other Ambulatory Visit: Payer: Self-pay

## 2017-08-25 VITALS — BP 168/68 | HR 68 | Temp 98.5°F | Ht 66.0 in | Wt 215.1 lb

## 2017-08-25 DIAGNOSIS — Z9114 Patient's other noncompliance with medication regimen: Secondary | ICD-10-CM | POA: Diagnosis not present

## 2017-08-25 DIAGNOSIS — I5032 Chronic diastolic (congestive) heart failure: Secondary | ICD-10-CM | POA: Diagnosis not present

## 2017-08-25 DIAGNOSIS — M109 Gout, unspecified: Secondary | ICD-10-CM

## 2017-08-25 DIAGNOSIS — Z87891 Personal history of nicotine dependence: Secondary | ICD-10-CM

## 2017-08-25 DIAGNOSIS — E1122 Type 2 diabetes mellitus with diabetic chronic kidney disease: Secondary | ICD-10-CM

## 2017-08-25 DIAGNOSIS — N183 Chronic kidney disease, stage 3 (moderate): Secondary | ICD-10-CM

## 2017-08-25 DIAGNOSIS — I130 Hypertensive heart and chronic kidney disease with heart failure and stage 1 through stage 4 chronic kidney disease, or unspecified chronic kidney disease: Secondary | ICD-10-CM | POA: Diagnosis present

## 2017-08-25 DIAGNOSIS — I878 Other specified disorders of veins: Secondary | ICD-10-CM

## 2017-08-25 DIAGNOSIS — Z7982 Long term (current) use of aspirin: Secondary | ICD-10-CM | POA: Diagnosis not present

## 2017-08-25 DIAGNOSIS — Z9111 Patient's noncompliance with dietary regimen: Secondary | ICD-10-CM

## 2017-08-25 DIAGNOSIS — Z79899 Other long term (current) drug therapy: Secondary | ICD-10-CM

## 2017-08-25 NOTE — Patient Instructions (Addendum)
It was a pleasure to meet you today Dylan [name],   Please start taking torsemide 40 mg in the morning ( 4 tablets ) and 10 mg ( 1 tablet ) in the afternoon  Please weigh yourself daily -if you gain greater than 3 pounds in 1 day or 5 pounds in 1 week call the clinic Please eat a low salt diet through the recommendations below, avoid potato chips, canned food, frozen food, and adding salt to your food  We need to repeat the ultrasound exam of your heart, please call the clinic if you have not heard back about scheduling this within the next week We have ordered some oxygen to be delivered to your home, please use 2 liters while you are walking around during the day, dont use any oxygen while you are sitting   FOLLOW-UP INSTRUCTIONS When: 1 week  For: follow up of your difficulty breathing  What to bring: all of your medication bottles     Low-Sodium Eating Plan Sodium, which is an element that makes up salt, helps you maintain a healthy balance of fluids in your body. Too much sodium can increase your blood pressure and cause fluid and waste to be held in your body. Your health care provider or dietitian may recommend following this plan if you have high blood pressure (hypertension), kidney disease, liver disease, or heart failure. Eating less sodium can help lower your blood pressure, reduce swelling, and protect your heart, liver, and kidneys. What are tips for following this plan? General guidelines  Most people on this plan should limit their sodium intake to 1,500-2,000 mg (milligrams) of sodium each day. Reading food labels  The Nutrition Facts label lists the amount of sodium in one serving of the food. If you eat more than one serving, you must multiply the listed amount of sodium by the number of servings.  Choose foods with less than 140 mg of sodium per serving.  Avoid foods with 300 mg of sodium or more per serving. Shopping  Look for lower-sodium products, often labeled as  "low-sodium" or "no salt added."  Always check the sodium content even if foods are labeled as "unsalted" or "no salt added".  Buy fresh foods. ? Avoid canned foods and premade or frozen meals. ? Avoid canned, cured, or processed meats  Buy breads that have less than 80 mg of sodium per slice. Cooking  Eat more home-cooked food and less restaurant, buffet, and fast food.  Avoid adding salt when cooking. Use salt-free seasonings or herbs instead of table salt or sea salt. Check with your health care provider or pharmacist before using salt substitutes.  Cook with plant-based oils, such as canola, sunflower, or olive oil. Meal planning  When eating at a restaurant, ask that your food be prepared with less salt or no salt, if possible.  Avoid foods that contain MSG (monosodium glutamate). MSG is sometimes added to [place] food, bouillon, and some canned foods. What foods are recommended? The items listed may not be a complete list. Talk with your dietitian about what dietary choices are best for you. Grains Low-sodium cereals, including oats, puffed wheat and rice, and shredded wheat. Low-sodium crackers. Unsalted rice. Unsalted pasta. Low-sodium bread. Whole-grain breads and whole-grain pasta. Vegetables Fresh or frozen vegetables. "No salt added" canned vegetables. "No salt added" tomato sauce and paste. Low-sodium or reduced-sodium tomato and vegetable juice. Fruits Fresh, frozen, or canned fruit. Fruit juice. Meats and other protein foods Fresh or frozen (no salt added)  meat, poultry, seafood, and fish. Low-sodium canned tuna and salmon. Unsalted nuts. Dried peas, beans, and lentils without added salt. Unsalted canned beans. Eggs. Unsalted nut butters. Dairy Milk. Soy milk. Cheese that is naturally low in sodium, such as ricotta cheese, fresh mozzarella, or Swiss cheese Low-sodium or reduced-sodium cheese. Cream cheese. Yogurt. Fats and oils Unsalted butter. Unsalted margarine with  no trans fat. Vegetable oils such as canola or olive oils. Seasonings and other foods Fresh and dried herbs and spices. Salt-free seasonings. Low-sodium mustard and ketchup. Sodium-free salad dressing. Sodium-free light mayonnaise. Fresh or refrigerated horseradish. Lemon juice. Vinegar. Homemade, reduced-sodium, or low-sodium soups. Unsalted popcorn and pretzels. Low-salt or salt-free chips. What foods are not recommended? The items listed may not be a complete list. Talk with your dietitian about what dietary choices are best for you. Grains Instant hot cereals. Bread stuffing, pancake, and biscuit mixes. Croutons. Seasoned rice or pasta mixes. Noodle soup cups. Boxed or frozen macaroni and cheese. Regular salted crackers. Self-rising flour. Vegetables Sauerkraut, pickled vegetables, and relishes. Olives. [place] fries. Onion rings. Regular canned vegetables (not low-sodium or reduced-sodium). Regular canned tomato sauce and paste (not low-sodium or reduced-sodium). Regular tomato and vegetable juice (not low-sodium or reduced-sodium). Frozen vegetables in sauces. Meats and other protein foods Meat or fish that is salted, canned, smoked, spiced, or pickled. Bacon, ham, sausage, hotdogs, corned beef, chipped beef, packaged lunch meats, salt pork, jerky, pickled herring, anchovies, regular canned tuna, sardines, salted nuts. Dairy Processed cheese and cheese spreads. Cheese curds. Blue cheese. Feta cheese. String cheese. Regular cottage cheese. Buttermilk. Canned milk. Fats and oils Salted butter. Regular margarine. Ghee. Bacon fat. Seasonings and other foods Onion salt, garlic salt, seasoned salt, table salt, and sea salt. Canned and packaged gravies. Worcestershire sauce. Tartar sauce. Barbecue sauce. Teriyaki sauce. Soy sauce, including reduced-sodium. Steak sauce. Fish sauce. Oyster sauce. Cocktail sauce. Horseradish that you find on the shelf. Regular ketchup and mustard. Meat flavorings and  tenderizers. Bouillon cubes. Hot sauce and Tabasco sauce. Premade or packaged marinades. Premade or packaged taco seasonings. Relishes. Regular salad dressings. Salsa. Potato and tortilla chips. Corn chips and puffs. Salted popcorn and pretzels. Canned or dried soups. Pizza. Frozen entrees and pot pies. Summary  Eating less sodium can help lower your blood pressure, reduce swelling, and protect your heart, liver, and kidneys.  Most people on this plan should limit their sodium intake to 1,500-2,000 mg (milligrams) of sodium each day.  Canned, boxed, and frozen foods are high in sodium. Restaurant foods, fast foods, and pizza are also very high in sodium. You also get sodium by adding salt to food.  Try to cook at home, eat more fresh fruits and vegetables, and eat less fast food, canned, processed, or prepared foods. This information is not intended to replace advice given to you by your health care provider. Make sure you discuss any questions you have with your health care provider. Document Released: 12/14/2001 Document Revised: 06/17/2016 Document Reviewed: 06/17/2016 Elsevier Interactive Patient Education  [employer].

## 2017-08-25 NOTE — Progress Notes (Signed)
[facility] Medicine Clinic Attending  Case discussed with Dr. Hetty Ely at the time of the visit.  We reviewed the resident's history and exam and pertinent patient test results.  I agree with the assessment, diagnosis, and plan of care documented in the resident's note.

## 2017-08-25 NOTE — Progress Notes (Signed)
CC: swelling in both feet   HPI:  Dylan [name] is a 72 y.o. with PMH diabetes, CKD, hypertension, CHF and gout who presents for acute concern of swelling in both feet. Please see the assessment and plans for the status of the patient chronic medical problems.    Past Medical History:  Diagnosis Date  . Anemia 10/18/2014   Baseline about 12 and stable from 2010 to 2016. Colon 2009 in [place] (records cannot be obtained).  EGD Dr Benson [place] 2011 nl   . Chronic diastolic heart failure ([facility]) 10/18/2014   Noted ECHO 10/2014. Grade 2. EF 50-55%   . Chronic renal failure, stage 3 (moderate) ([name]) 10/18/2014   Baseline Cr about 1.5. Stable from 2010 to 2016.   [facility] Kitchen Coronary artery disease   . Diverticulosis 10/08/2014   Seen on CT. Reportedly on Colon in [place] in 2009. Freq bouts of diverticulitis.  . DM (diabetes mellitus), type 2 with renal complications ([place]) 08/16/9369  . Essential hypertension 10/09/2014   Poor control with 5 drug therapy.   . Former tobacco use 10/08/2014  . Gout   . OSA on CPAP 10/08/2014  . Pulmonary HTN ([place]) 10/18/2014   Noted as severe ECHO 2016. Likely 2/2 severe OSA.   Review of Systems:  Refer to history of present illness and assessment and plans for pertinent review of systems, all others reviewed and negative  Physical Exam:  Vitals:   08/25/17 0833  BP: (!) 168/68  Pulse: 68  Temp: 98.5 F (36.9 C)  TempSrc: Oral  SpO2: 95%  Weight: 215 lb 1.6 oz (97.6 kg)  Height: _0  (1.676 m)   General: well appearing, no acute distress  Cardiac: RRR, no murmur appreciated, 1+ bl lower extremity pitting edema, JVD distention to the level of the ear  Pulm: And full sentences, no accessory muscle use, lungs clear to auscultation, no wheezing or rhonchi Abdomen: soft, distended, non tender   Assessment & Plan:   Volume overload  CHF exacerbation 2/2 missed doses of medications and dietary indiscretion  Patient reporting a few months of progressively worsening shortness of breath  which became accompanied by bilateral lower extremity edema, abdominal distention and productive cough over the last week.  Never felt comfortable sleeping on his back, usually sleeps on his side or stomach.  He has had to sleep sitting up in his recliner recently.  He denies paroxysmal nocturnal dyspnea.  He has shortness of breath with exertion. He denies chest pain, diaphoresis, nausea or palpitations with exertion.  When vitals were checked he was noted to be satting at 88% on room air. On exam he has signs of volume overload including JVD to the ears and 1+ bilateral lower extremity pitting edema. His lungs sound clear and he is speaking full sentences but when reclined to check for JVD he again is using accessory muscles. When asked how he takes the torsemide he notes once in the morning and once at night, we checked the medication bottle and saw that it was prescribed for two tablets in the morning and once at night, and that the bottle which was picked up 12/27 with 3 month supply had well over one weeks worth of tablets left. He likes eating potato chips and frozen food.Pulse ox was checked while ambulating: resting 94 on room air, ambulating on room air 88%, improved to 94% with 2 liters. Reports good family support at home. Last Echo 2016 - EF 69-67%, grade 2 diastolic dysfunction. Weight is up 7  lbs from the time of last office visit in December.  - start torsemide 40 mg q AM and 10 mg PM  - continue pravastatin 40 mg qd, labetalol 200 mg BID, and aspirin 81 mg qd - encouraged low sodium diet  - daily weights  - repeat Echo  - DME order for 2 L O2 with ambulation  - RTC in one week   See Encounters Tab for problem based charting.  Patient discussed with Dr. Dareen Piano

## 2017-08-25 NOTE — Progress Notes (Signed)
SATURATION QUALIFICATIONS: (This note is used to comply with regulatory documentation for home oxygen)  Patient Saturations on Room Air at Rest = 94 %  Patient Saturations on Room Air while Ambulating 88%  Patient Saturations on 2 liters  Liters of oxygen while Ambulating = 95%  Shortness of breath when ambulating .

## 2017-08-25 NOTE — Assessment & Plan Note (Addendum)
Patient reporting a few months of progressively worsening shortness of breath which became accompanied by bilateral lower extremity edema, abdominal distention and productive cough over the last week.  Never felt comfortable sleeping on his back, usually sleeps on his side or stomach.  He has had to sleep sitting up in his recliner recently.  He denies paroxysmal nocturnal dyspnea.  He has shortness of breath with exertion. He denies chest pain, diaphoresis, nausea or palpitations with exertion.  When vitals were checked he was noted to be satting at 88% on room air. On exam he has signs of volume overload including JVD to the ears and 1+ bilateral lower extremity pitting edema. His lungs sound clear and he is speaking full sentences but when reclined to check for JVD he again is using accessory muscles. When asked how he takes the torsemide he notes once in the morning and once at night, we checked the medication bottle and saw that it was prescribed for two tablets in the morning and once at night, and that the bottle which was picked up 12/27 with 3 month supply had well over one weeks worth of tablets left. He likes eating potato chips and frozen food.Pulse ox was checked while ambulating: resting 94 on room air, ambulating on room air 88%, improved to 94% with 2 liters. Reports good family support at home. Last Echo 2016 - EF 25-49%, grade 2 diastolic dysfunction. Weight is up 7 lbs from the time of last office visit in December.  - start torsemide 40 mg q AM and 10 mg PM  - continue pravastatin 40 mg qd, labetalol 200 mg BID, and aspirin 81 mg qd - encouraged low sodium diet  - daily weights  - repeat Echo  - DME order for 2 L O2 with ambulation  - RTC in one week

## 2017-08-26 ENCOUNTER — Telehealth: Payer: Self-pay | Admitting: Internal Medicine

## 2017-08-26 NOTE — Telephone Encounter (Signed)
Pt's DME REQUEST for OXYGEN has been faxed to [facility] @ [phone].  Please have the patient to call their office directly @ [phone] if they have any questions regarding payment, delivery date and time.

## 2017-09-01 ENCOUNTER — Telehealth: Payer: Self-pay | Admitting: *Deleted

## 2017-09-01 ENCOUNTER — Ambulatory Visit (INDEPENDENT_AMBULATORY_CARE_PROVIDER_SITE_OTHER): Payer: Medicare Other | Admitting: Internal Medicine

## 2017-09-01 ENCOUNTER — Other Ambulatory Visit: Payer: Self-pay

## 2017-09-01 VITALS — BP 150/61 | HR 66 | Temp 98.6°F | Ht 66.0 in | Wt 215.4 lb

## 2017-09-01 DIAGNOSIS — Z87891 Personal history of nicotine dependence: Secondary | ICD-10-CM | POA: Diagnosis not present

## 2017-09-01 DIAGNOSIS — Z9111 Patient's noncompliance with dietary regimen: Secondary | ICD-10-CM

## 2017-09-01 DIAGNOSIS — I5032 Chronic diastolic (congestive) heart failure: Secondary | ICD-10-CM

## 2017-09-01 DIAGNOSIS — Z79899 Other long term (current) drug therapy: Secondary | ICD-10-CM

## 2017-09-01 DIAGNOSIS — Z7982 Long term (current) use of aspirin: Secondary | ICD-10-CM

## 2017-09-01 MED ORDER — TORSEMIDE 10 MG PO TABS: ORAL_TABLET | ORAL | 0 refills | Status: DC

## 2017-09-01 NOTE — Telephone Encounter (Signed)
Pt was called to schedule for a missed lab and to ask patient to come by tomorrow when he is here for other tests.  No answe at number in Chart.  Will attemp to call later.  Sander Nephew, RN 09/01/2017 10:40 AM.

## 2017-09-01 NOTE — Patient Instructions (Addendum)
Dylan [name] it was nice meeting you today.   -Continue taking Torsemide 40 mg in the morning and 10 mg in the evening.  -Restrict your dietary salt intake.   -Restrict fluid intake to less than 2 liters per day.  -Please check your weight every morning before breakfast and keep a log of the readings.   -Please go for your echocardiogram tomorrow.   FOLLOW-UP INSTRUCTIONS When: 09/11/2017 at 9:45 am  For: Regular checkup with Dr. Lynnae January  What to bring: Medications, log of daily weights   Low-Sodium Eating Plan Sodium, which is an element that makes up salt, helps you maintain a healthy balance of fluids in your body. Too much sodium can increase your blood pressure and cause fluid and waste to be held in your body. Your health care provider or dietitian may recommend following this plan if you have high blood pressure (hypertension), kidney disease, liver disease, or heart failure. Eating less sodium can help lower your blood pressure, reduce swelling, and protect your heart, liver, and kidneys. What are tips for following this plan? General guidelines  Most people on this plan should limit their sodium intake to 1,500-2,000 mg (milligrams) of sodium each day. Reading food labels  The Nutrition Facts label lists the amount of sodium in one serving of the food. If you eat more than one serving, you must multiply the listed amount of sodium by the number of servings.  Choose foods with less than 140 mg of sodium per serving.  Avoid foods with 300 mg of sodium or more per serving. Shopping  Look for lower-sodium products, often labeled as "low-sodium" or "no salt added."  Always check the sodium content even if foods are labeled as "unsalted" or "no salt added".  Buy fresh foods. ? Avoid canned foods and premade or frozen meals. ? Avoid canned, cured, or processed meats  Buy breads that have less than 80 mg of sodium per slice. Cooking  Eat more home-cooked food and less  restaurant, buffet, and fast food.  Avoid adding salt when cooking. Use salt-free seasonings or herbs instead of table salt or sea salt. Check with your health care provider or pharmacist before using salt substitutes.  Cook with plant-based oils, such as canola, sunflower, or olive oil. Meal planning  When eating at a restaurant, ask that your food be prepared with less salt or no salt, if possible.  Avoid foods that contain MSG (monosodium glutamate). MSG is sometimes added to [place] food, bouillon, and some canned foods. What foods are recommended? The items listed may not be a complete list. Talk with your dietitian about what dietary choices are best for you. Grains Low-sodium cereals, including oats, puffed wheat and rice, and shredded wheat. Low-sodium crackers. Unsalted rice. Unsalted pasta. Low-sodium bread. Whole-grain breads and whole-grain pasta. Vegetables Fresh or frozen vegetables. "No salt added" canned vegetables. "No salt added" tomato sauce and paste. Low-sodium or reduced-sodium tomato and vegetable juice. Fruits Fresh, frozen, or canned fruit. Fruit juice. Meats and other protein foods Fresh or frozen (no salt added) meat, poultry, seafood, and fish. Low-sodium canned tuna and salmon. Unsalted nuts. Dried peas, beans, and lentils without added salt. Unsalted canned beans. Eggs. Unsalted nut butters. Dairy Milk. Soy milk. Cheese that is naturally low in sodium, such as ricotta cheese, fresh mozzarella, or Swiss cheese Low-sodium or reduced-sodium cheese. Cream cheese. Yogurt. Fats and oils Unsalted butter. Unsalted margarine with no trans fat. Vegetable oils such as canola or olive oils. Seasonings and other  foods Fresh and dried herbs and spices. Salt-free seasonings. Low-sodium mustard and ketchup. Sodium-free salad dressing. Sodium-free light mayonnaise. Fresh or refrigerated horseradish. Lemon juice. Vinegar. Homemade, reduced-sodium, or low-sodium soups. Unsalted  popcorn and pretzels. Low-salt or salt-free chips. What foods are not recommended? The items listed may not be a complete list. Talk with your dietitian about what dietary choices are best for you. Grains Instant hot cereals. Bread stuffing, pancake, and biscuit mixes. Croutons. Seasoned rice or pasta mixes. Noodle soup cups. Boxed or frozen macaroni and cheese. Regular salted crackers. Self-rising flour. Vegetables Sauerkraut, pickled vegetables, and relishes. Olives. [place] fries. Onion rings. Regular canned vegetables (not low-sodium or reduced-sodium). Regular canned tomato sauce and paste (not low-sodium or reduced-sodium). Regular tomato and vegetable juice (not low-sodium or reduced-sodium). Frozen vegetables in sauces. Meats and other protein foods Meat or fish that is salted, canned, smoked, spiced, or pickled. Bacon, ham, sausage, hotdogs, corned beef, chipped beef, packaged lunch meats, salt pork, jerky, pickled herring, anchovies, regular canned tuna, sardines, salted nuts. Dairy Processed cheese and cheese spreads. Cheese curds. Blue cheese. Feta cheese. String cheese. Regular cottage cheese. Buttermilk. Canned milk. Fats and oils Salted butter. Regular margarine. Ghee. Bacon fat. Seasonings and other foods Onion salt, garlic salt, seasoned salt, table salt, and sea salt. Canned and packaged gravies. Worcestershire sauce. Tartar sauce. Barbecue sauce. Teriyaki sauce. Soy sauce, including reduced-sodium. Steak sauce. Fish sauce. Oyster sauce. Cocktail sauce. Horseradish that you find on the shelf. Regular ketchup and mustard. Meat flavorings and tenderizers. Bouillon cubes. Hot sauce and Tabasco sauce. Premade or packaged marinades. Premade or packaged taco seasonings. Relishes. Regular salad dressings. Salsa. Potato and tortilla chips. Corn chips and puffs. Salted popcorn and pretzels. Canned or dried soups. Pizza. Frozen entrees and pot pies. Summary  Eating less sodium can help lower  your blood pressure, reduce swelling, and protect your heart, liver, and kidneys.  Most people on this plan should limit their sodium intake to 1,500-2,000 mg (milligrams) of sodium each day.  Canned, boxed, and frozen foods are high in sodium. Restaurant foods, fast foods, and pizza are also very high in sodium. You also get sodium by adding salt to food.  Try to cook at home, eat more fresh fruits and vegetables, and eat less fast food, canned, processed, or prepared foods. This information is not intended to replace advice given to you by your health care provider. Make sure you discuss any questions you have with your health care provider. Document Released: 12/14/2001 Document Revised: 06/17/2016 Document Reviewed: 06/17/2016 Elsevier Interactive Patient Education  [employer].

## 2017-09-01 NOTE — Progress Notes (Signed)
   CC: Follow-up of chronic diastolic heart failure exacerbation   HPI:  [name] is a 72 y.o. male with a past medical history of conditions listed below presenting to the clinic for a follow-up of chronic diastolic heart failure exacerbation. Please see problem based charting for the status of the patient's current and chronic medical conditions.   Past Medical History:  Diagnosis Date  . Anemia 10/18/2014   Baseline about 12 and stable from 2010 to 2016. Colon 2009 in [place] (records cannot be obtained).  EGD Dr Dylan [name] 2011 nl   . Chronic diastolic heart failure (Dylan [name]) 10/18/2014   Noted ECHO 10/2014. Grade 2. EF 50-55%   . Chronic renal failure, stage 3 (moderate) ([name]) 10/18/2014   Baseline Cr about 1.5. Stable from 2010 to 2016.   Marland Kitchen Coronary artery disease   . Diverticulosis 10/08/2014   Seen on CT. Reportedly on Colon in [place] in 2009. Freq bouts of diverticulitis.  . DM (diabetes mellitus), type 2 with renal complications (Dylan [name]) 2/0/7218  . Essential hypertension 10/09/2014   Poor control with 5 drug therapy.   . Former tobacco use 10/08/2014  . Gout   . OSA on CPAP 10/08/2014  . Pulmonary HTN (Dylan [name]) 10/18/2014   Noted as severe ECHO 2016. Likely 2/2 severe OSA.   Review of Systems: Pertinent positives mentioned in HPI. Remainder of all ROS negative.   Physical Exam:  Vitals:   09/01/17 0904  BP: (!) 150/61  Pulse: 66  Temp: 98.6 F (37 C)  TempSrc: Oral  SpO2: 92%  Weight: 215 lb 6.4 oz (97.7 kg)  Height: _0  (1.676 m)   Physical Exam  Constitutional: He is oriented to person, place, and time. He appears well-developed and well-nourished. No distress.  HENT:  Head: Normocephalic and atraumatic.  Mouth/Throat: Oropharynx is clear and moist.  Eyes: Right eye exhibits no discharge. Left eye exhibits no discharge.  Cardiovascular: Normal rate, regular rhythm and intact distal pulses.  Pulmonary/Chest: Effort normal and breath sounds normal. No respiratory distress. He has no  wheezes. He has no rales.  Breathing comfortably on room air.  No accessory muscle use.  Abdominal: Soft. Bowel sounds are normal. He exhibits no distension. There is no tenderness.  Musculoskeletal: He exhibits no edema.  +1 pitting edema of bilateral lower extremities  Neurological: He is alert and oriented to person, place, and time.  Skin: Skin is warm and dry.    Assessment & Plan:   See Encounters Tab for problem based charting.  Patient discussed with Dr. Daryll Drown

## 2017-09-01 NOTE — Assessment & Plan Note (Addendum)
Patient is here for a follow-up of chronic diastolic heart failure exacerbation.  He has been taking torsemide 40 mg in the morning and 10 mg in the evening as instructed during his previous visit.  He is not following a sodium restricted diet.  He is not restricting his fluid consumption either.  He has a weighing scale at home but has not been weighing himself.  He has not lost any weight since his last visit.  DME oxygen was ordered during his previous visit but patient has not been using it.  He did not bring oxygen with him to this visit.  Does report feeling short of breath only with exertion.  States his legs are still swollen.  At this visit, noted to be breathing comfortably on room air with no accessory muscle use.  Lungs clear.  SPO2 92% on room air.  +1 pitting edema of bilateral lower extremities.  Plan -Continue torsemide 40 mg in the morning and 10 mg in the evening -Advised him to use 2L home oxygen with ambulation -Discussed dietary sodium restriction and fluid restriction (less than 2 L/day) -Advised him to check his weight every morning before breakfast and keep a log of the readings -He is scheduled for his echo tomorrow -BMP to assess renal function and electrolyte status -Follow-up appointment scheduled on March 7  Addendum: Labs showing increase in BUN and Cr from baseline likely due to increase in dose of torsemide for diuresis in the setting of acute exacerbation of HFpEF. Since his SOB has improved and lungs clear on exam at this visit, advised patient to go back to his previous dose of Torsemide 20 mg in the morning and 10 mg in the evening. He has a follow-up appointment on September 11, 2017 and BMP needs to be repeated at that time.

## 2017-09-02 ENCOUNTER — Other Ambulatory Visit (INDEPENDENT_AMBULATORY_CARE_PROVIDER_SITE_OTHER): Payer: Medicare Other

## 2017-09-02 ENCOUNTER — Ambulatory Visit (HOSPITAL_BASED_OUTPATIENT_CLINIC_OR_DEPARTMENT_OTHER)
Admission: RE | Admit: 2017-09-02 | Discharge: 2017-09-02 | Disposition: A | Discharge status: Home / Self Care | Payer: Medicare Other | Source: Ambulatory Visit | Attending: Family | Admitting: Family

## 2017-09-02 ENCOUNTER — Ambulatory Visit (HOSPITAL_COMMUNITY)
Admission: RE | Admit: 2017-09-02 | Discharge: 2017-09-02 | Disposition: A | Discharge status: Home / Self Care | Payer: Medicare Other | Source: Ambulatory Visit | Attending: Internal Medicine | Admitting: Internal Medicine

## 2017-09-02 DIAGNOSIS — E119 Type 2 diabetes mellitus without complications: Secondary | ICD-10-CM | POA: Insufficient documentation

## 2017-09-02 DIAGNOSIS — I779 Disorder of arteries and arterioles, unspecified: Secondary | ICD-10-CM | POA: Diagnosis not present

## 2017-09-02 DIAGNOSIS — I70213 Atherosclerosis of native arteries of extremities with intermittent claudication, bilateral legs: Secondary | ICD-10-CM

## 2017-09-02 DIAGNOSIS — I2510 Atherosclerotic heart disease of native coronary artery without angina pectoris: Secondary | ICD-10-CM | POA: Insufficient documentation

## 2017-09-02 DIAGNOSIS — I5032 Chronic diastolic (congestive) heart failure: Secondary | ICD-10-CM | POA: Diagnosis not present

## 2017-09-02 DIAGNOSIS — I313 Pericardial effusion (noninflammatory): Secondary | ICD-10-CM | POA: Insufficient documentation

## 2017-09-02 DIAGNOSIS — I110 Hypertensive heart disease with heart failure: Secondary | ICD-10-CM | POA: Insufficient documentation

## 2017-09-02 DIAGNOSIS — Z87891 Personal history of nicotine dependence: Secondary | ICD-10-CM

## 2017-09-02 NOTE — Progress Notes (Signed)
Internal Medicine Clinic Attending  Case discussed with Dr. Marlowe Sax at the time of the visit.  We reviewed the resident's history and exam and pertinent patient test results.  I agree with the assessment, diagnosis, and plan of care documented in the resident's note.

## 2017-09-02 NOTE — Progress Notes (Signed)
  Echocardiogram 2D Echocardiogram has been performed.  Marilene Vath T Rayburn Mundis 09/02/2017, 4:11 PM

## 2017-09-02 NOTE — Progress Notes (Signed)
ABI and TBI completed. Right 0.55 moderate reduction. Lt 0.71 moderate reduction Bilateral  TBI abnormal. [facility], [facility]  09/02/2017, 2:25 PM

## 2017-09-03 LAB — BMP8+ANION GAP
Anion Gap: 20 mmol/L — ABNORMAL HIGH (ref 10.0–18.0)
BUN/Creatinine Ratio: 12 (ref 10–24)
BUN: 30 mg/dL — ABNORMAL HIGH (ref 8–27)
CO2: 20 mmol/L (ref 20–29)
Calcium: 9 mg/dL (ref 8.6–10.2)
Chloride: 107 mmol/L — ABNORMAL HIGH (ref 96–106)
Creatinine, Ser: 2.52 mg/dL — ABNORMAL HIGH (ref 0.76–1.27)
GFR calc Af Amer: 28 mL/min/{1.73_m2} — ABNORMAL LOW (ref >59)
GFR calc non Af Amer: 24 mL/min/{1.73_m2} — ABNORMAL LOW (ref >59)
Glucose: 138 mg/dL — ABNORMAL HIGH (ref 65–99)
Potassium: 3.6 mmol/L (ref 3.5–5.2)
Sodium: 147 mmol/L — ABNORMAL HIGH (ref 134–144)

## 2017-09-11 ENCOUNTER — Other Ambulatory Visit: Payer: Self-pay

## 2017-09-11 ENCOUNTER — Ambulatory Visit (INDEPENDENT_AMBULATORY_CARE_PROVIDER_SITE_OTHER): Payer: Medicare Other | Admitting: Internal Medicine

## 2017-09-11 ENCOUNTER — Encounter: Payer: Self-pay | Admitting: Internal Medicine

## 2017-09-11 VITALS — BP 166/55 | HR 60 | Temp 98.2°F | Ht 66.0 in | Wt 214.5 lb

## 2017-09-11 DIAGNOSIS — G4733 Obstructive sleep apnea (adult) (pediatric): Secondary | ICD-10-CM | POA: Diagnosis not present

## 2017-09-11 DIAGNOSIS — R011 Cardiac murmur, unspecified: Secondary | ICD-10-CM

## 2017-09-11 DIAGNOSIS — R0600 Dyspnea, unspecified: Secondary | ICD-10-CM | POA: Diagnosis not present

## 2017-09-11 DIAGNOSIS — I70213 Atherosclerosis of native arteries of extremities with intermittent claudication, bilateral legs: Secondary | ICD-10-CM

## 2017-09-11 DIAGNOSIS — H1589 Other disorders of sclera: Secondary | ICD-10-CM | POA: Diagnosis not present

## 2017-09-11 DIAGNOSIS — Z6835 Body mass index (BMI) 35.0-35.9, adult: Secondary | ICD-10-CM

## 2017-09-11 DIAGNOSIS — E669 Obesity, unspecified: Secondary | ICD-10-CM

## 2017-09-11 DIAGNOSIS — I2721 Secondary pulmonary arterial hypertension: Secondary | ICD-10-CM | POA: Diagnosis not present

## 2017-09-11 DIAGNOSIS — Z794 Long term (current) use of insulin: Secondary | ICD-10-CM

## 2017-09-11 DIAGNOSIS — E1121 Type 2 diabetes mellitus with diabetic nephropathy: Secondary | ICD-10-CM

## 2017-09-11 DIAGNOSIS — R140 Abdominal distension (gaseous): Secondary | ICD-10-CM

## 2017-09-11 DIAGNOSIS — E1122 Type 2 diabetes mellitus with diabetic chronic kidney disease: Secondary | ICD-10-CM

## 2017-09-11 DIAGNOSIS — N183 Chronic kidney disease, stage 3 unspecified: Secondary | ICD-10-CM

## 2017-09-11 DIAGNOSIS — Z9989 Dependence on other enabling machines and devices: Secondary | ICD-10-CM

## 2017-09-11 DIAGNOSIS — Z6834 Body mass index (BMI) 34.0-34.9, adult: Secondary | ICD-10-CM | POA: Diagnosis not present

## 2017-09-11 DIAGNOSIS — I152 Hypertension secondary to endocrine disorders: Secondary | ICD-10-CM

## 2017-09-11 DIAGNOSIS — R350 Frequency of micturition: Secondary | ICD-10-CM

## 2017-09-11 DIAGNOSIS — Z79899 Other long term (current) drug therapy: Secondary | ICD-10-CM | POA: Diagnosis not present

## 2017-09-11 DIAGNOSIS — H0520 Unspecified exophthalmos: Secondary | ICD-10-CM

## 2017-09-11 DIAGNOSIS — I130 Hypertensive heart and chronic kidney disease with heart failure and stage 1 through stage 4 chronic kidney disease, or unspecified chronic kidney disease: Secondary | ICD-10-CM

## 2017-09-11 DIAGNOSIS — Z7984 Long term (current) use of oral hypoglycemic drugs: Secondary | ICD-10-CM

## 2017-09-11 DIAGNOSIS — M7989 Other specified soft tissue disorders: Secondary | ICD-10-CM | POA: Diagnosis not present

## 2017-09-11 DIAGNOSIS — E1159 Type 2 diabetes mellitus with other circulatory complications: Secondary | ICD-10-CM

## 2017-09-11 DIAGNOSIS — Z7982 Long term (current) use of aspirin: Secondary | ICD-10-CM | POA: Diagnosis not present

## 2017-09-11 DIAGNOSIS — H5789 Other specified disorders of eye and adnexa: Secondary | ICD-10-CM | POA: Diagnosis not present

## 2017-09-11 DIAGNOSIS — I272 Pulmonary hypertension, unspecified: Secondary | ICD-10-CM

## 2017-09-11 DIAGNOSIS — R5383 Other fatigue: Secondary | ICD-10-CM

## 2017-09-11 DIAGNOSIS — I70219 Atherosclerosis of native arteries of extremities with intermittent claudication, unspecified extremity: Secondary | ICD-10-CM

## 2017-09-11 DIAGNOSIS — I313 Pericardial effusion (noninflammatory): Secondary | ICD-10-CM | POA: Diagnosis not present

## 2017-09-11 DIAGNOSIS — Z87891 Personal history of nicotine dependence: Secondary | ICD-10-CM | POA: Diagnosis not present

## 2017-09-11 DIAGNOSIS — R0981 Nasal congestion: Secondary | ICD-10-CM

## 2017-09-11 DIAGNOSIS — I10 Essential (primary) hypertension: Secondary | ICD-10-CM

## 2017-09-11 DIAGNOSIS — E66812 Obesity, class 2: Secondary | ICD-10-CM

## 2017-09-11 LAB — POCT GLYCOSYLATED HEMOGLOBIN (HGB A1C): Hemoglobin A1C: 6.2

## 2017-09-11 LAB — GLUCOSE, CAPILLARY: Glucose-Capillary: 106 mg/dL — ABNORMAL HIGH (ref 65–99)

## 2017-09-11 MED ORDER — EPLERENONE 50 MG PO TABS: 50.0000 mg | ORAL_TABLET | Freq: Two times a day (BID) | ORAL | 3 refills | Status: DC

## 2017-09-11 NOTE — Assessment & Plan Note (Addendum)
This problem is chronic and stable. Reviewed most recent ABI's - R 0.55 and L 0.71. ABI mostly stable. On ASA 81, prava 40 mod intensity. Denies claudication and can walk 50 yrds. No tobacco use. Doesn't plan to see Dylan [name] bc "seeing too many doctors." ABI mostly stable and asymptomatic.   PLAN:  Cont current meds Encourage weight loss and walking

## 2017-09-11 NOTE — Assessment & Plan Note (Addendum)
This problem is chronic and uncontrolled. He is on labetolol 400 BID, verapamil 360, minoxidil 20 QD, demeedx 20 AM and 10 PM, epleronone 50 QD, and hydral 100 TID. He had all med bottles and able to verify dosing. BP not controlled. I am sure renal failure not helping BP control. Will increase epleronone.  PLAN : increase epleronone to 50 BID   BP Readings from Last 3 Encounters:  09/11/17 (!) 166/55  09/01/17 (!) 150/61  08/25/17 (!) 168/68

## 2017-09-11 NOTE — Patient Instructions (Signed)
1. Please increase your epleronone to one pill twice a day 2. Return in 2 weeks to see Dr Lynnae January or [facility] for blood pressure check 3. I am arranging for a sleep study 4. I am referring you to cardiology for pulmonary hypertension   Pulmonary Hypertension Pulmonary hypertension is high blood pressure within the arteries in your lungs (pulmonary arteries). It is different than having high blood pressure elsewhere in your body, such as blood pressure that is measured with a blood pressure cuff. Pulmonary hypertension makes it harder for blood to flow through the lungs. As a result, the heart must work harder to pump blood through the lungs, and it may be harder for you to breathe. Over time, this can weaken the heart muscle. Pulmonary hypertension is a serious condition and it can be fatal. What are the causes? Many different medical conditions can cause pulmonary hypertension. Pulmonary hypertension can be categorized by cause into five groups: Group 1 Pulmonary hypertension that is caused by abnormal growth of small blood vessels in the lungs (pulmonary arterial hypertension). The abnormal blood vessel growth may have no known cause, or it may be:  Passed along from a parent (hereditary).  Caused by another disease, such as a connective tissue disease (including lupus or scleroderma) or HIV.  Caused by certain drugs or toxins.  Group 2 Pulmonary hypertension that is caused by weakness of the main chamber of the heart (left ventricle) or heart valve disease. Group 3 Pulmonary hypertension that is caused by lung disease or low oxygen levels. Causes in this group include:  Emphysema or chronic obstructive pulmonary disease (COPD).  Untreated sleep apnea.  Pulmonary fibrosis.  Group 4 Pulmonary hypertension that is caused by blood clots in the lungs (pulmonary emboli). Group 5 Other causes of pulmonary hypertension, such as sickle cell anemia, or a mix of multiple causes. What are the  signs or symptoms? Symptoms of this condition include:  Shortness of breath. You may notice shortness of breath with: ? Activity, such as walking. ? No activity.  Tiredness and fatigue.  Dizziness or fainting.  Rapid heartbeat or feeling your heart flutter or skip a beat (palpitations).  Neck vein enlargement.  Bluish color to your lips and fingertips.  How is this diagnosed? This condition may be diagnosed by:  Chest X-ray.  Arterial blood gases. This test checks the acidity of your blood as well as your blood oxygen and carbon dioxide levels.  CT scan. This test can provide detailed images of your lungs.  Pulmonary function test. This test measures how much air your lungs can hold. It also tests how well air moves in and out of your lungs.  Electrocardiogram (ECG). This test traces the electrical activity of your heart.  Echocardiogram. This test is used to look at your heart in motion and check how it is functioning.  Heart catheterization. This test can measure the pressure in your pulmonary artery and the right side of your heart.  Lung biopsy. This procedure involves checking a sample of lung tissue to find underlying causes.  How is this treated? There is no cure for pulmonary hypertension, but treatment can help to relieve symptoms and slow the progress of the condition. Treatment can involve:  Medicines, such as: ? Blood pressure medicines. ? Medicines to relax (dilate) the pulmonary blood vessels. ? Water pills to get rid of extra fluid (diuretic medicines). ? Blood-thinning medicines.  Surgery. For severe pulmonary hypertension that does not respond to medical treatment, heart-lung or lung  transplant may be needed.  Follow these instructions at home:  Take medicines only as directed by your health care provider. These include over-the-counter medicines and prescription medicines. Take all medicines exactly as instructed. Do not change or stop medicines  without first checking with your health care provider.  Do not smoke. If you need help quitting, ask your health care provider.  Eat a healthy diet.  Limit your salt (sodium) intake to less than 2,300 mg per day.  Stay as active as possible. Exercise as directed by your health care provider. Talk with your health care provider about what type of exercise is safe for you.  Avoid high altitudes.  Avoid hot tubs and saunas.  Avoid becoming pregnant, if this applies. Talk with your health care provider about safe methods of birth control.  Keep all follow-up visits as directed by your health care provider. This is important. Get help right away if:  You have severe shortness of breath.  You develop chest pain or pressure in your chest.  You cough up blood.  You develop swelling of your feet or legs.  You have a significant increase in weight within 1-2 days. This information is not intended to replace advice given to you by your health care provider. Make sure you discuss any questions you have with your health care provider. Document Released: 04/21/2007 Document Revised: 01/12/2016 Document Reviewed: 12/14/2012 Elsevier Interactive Patient Education  2018 [employer].

## 2017-09-11 NOTE — Assessment & Plan Note (Signed)
This problem is chronic and uncontrolled. Checking a BMP today since had been on higher dose Demadex. Now back on regular dose of 20 AM and 10 PM.   PLAN : BMP

## 2017-09-11 NOTE — Assessment & Plan Note (Signed)
This problem is chronic and controlled. His A1C trend has been 5.2 - 6.0 - 6.2. He has been off insulin since he had hypoglycemia. On glipizide 5 BID. Denies lows. Checks CBG once a day fasting and 101 - 210. His GFR is in 20's so might be causing falsely low A1C. Did not tolerate CBM which would have given more info. Will encourage some post prandial CBG's next appt.  PLAN:  Cont current meds Encourage post prandial CBG next appt

## 2017-09-11 NOTE — Progress Notes (Signed)
   Subjective:    Patient ID: Dylan [name], male    DOB: 1945/09/25, 72 y.o.   MRN: [id number]  HPI  Dylan [name] is here for HTN and ECHO results. Please see the A&P for the status of the pt's chronic medical problems.  ROS : per ROS section and in problem oriented charting. All other systems are negative.  PMHx, Soc hx, and / or Fam hx : Trying a "tuna fish diet." Has a sweet tooth but doesn't like sodas. Able to walk more than 50 yds but less than 80 yards based on distance to mailbox  Review of Systems  Constitutional: Positive for fatigue.  HENT: Positive for congestion. Negative for rhinorrhea and sneezing.   Eyes: Positive for discharge.  Respiratory: Negative for shortness of breath.        +DOE  Cardiovascular: Positive for leg swelling.       Denies claudication  Gastrointestinal: Positive for abdominal distention.  Endocrine:       No hypoglycemia sxs  Genitourinary: Positive for frequency. Negative for difficulty urinating.       Objective:   Physical Exam  Constitutional: He appears well-developed and well-nourished. No distress.  HENT:  Head: Normocephalic and atraumatic.  Right Ear: External ear normal.  Left Ear: External ear normal.  Nose: Nose normal.  Eyes: EOM are normal. Right eye exhibits no discharge. Left eye exhibits no discharge. No scleral icterus.  Proptosis Injected sclera B  Cardiovascular: Normal rate and regular rhythm.  Murmur heard. 3/6 systolic murmur best at L sternal border but also R  Pulmonary/Chest: Effort normal and breath sounds normal. No respiratory distress.  Neurological: He is alert.  Gait nl  Skin: Skin is warm and dry. He is not diaphoretic.  Psychiatric: He has a normal mood and affect. His behavior is normal. Judgment and thought content normal.      Assessment & Plan:

## 2017-09-12 ENCOUNTER — Encounter: Payer: Self-pay | Admitting: Internal Medicine

## 2017-09-12 LAB — BMP8+ANION GAP
Anion Gap: 15 mmol/L (ref 10.0–18.0)
BUN/Creatinine Ratio: 15 (ref 10–24)
BUN: 27 mg/dL (ref 8–27)
CO2: 23 mmol/L (ref 20–29)
Calcium: 9.5 mg/dL (ref 8.6–10.2)
Chloride: 108 mmol/L — ABNORMAL HIGH (ref 96–106)
Creatinine, Ser: 1.79 mg/dL — ABNORMAL HIGH (ref 0.76–1.27)
GFR calc Af Amer: 43 mL/min/{1.73_m2} — ABNORMAL LOW (ref >59)
GFR calc non Af Amer: 37 mL/min/{1.73_m2} — ABNORMAL LOW (ref >59)
Glucose: 111 mg/dL — ABNORMAL HIGH (ref 65–99)
Potassium: 3.9 mmol/L (ref 3.5–5.2)
Sodium: 146 mmol/L — ABNORMAL HIGH (ref 134–144)

## 2017-09-12 NOTE — Assessment & Plan Note (Signed)
This problem is chronic and uncontrolled.  He had an echo in February 2019 which showed essentially normal left sided cardiac function with the exception of LVH and grade 1 diastolic dysfunction.  However it showed a pericardial effusion of moderate size, mild dilation of the right ventricle, severe dilation of the right atrium, and severe increase in the pulmonary artery pressure.  These results are not significantly different from his echo about 2 years ago.  It was felt that his pulmonary hypertension was due to severe untreated OSA.  He has not been using BiPAP at all past several years and the reasons are documented in my notes.  He is now agreeable for a repeat sleep study and 9 ordering that today.  Additionally, I am asking that he see cardiology for an evaluation to see if there are any other studies or treatments that he needs outside of the sleep study.  He appears to be intravascularly euvolemic although he is total body volume up.  Diuresis was tried last month and it resulted in an increase in his creatinine without any significant decrease in his weight.  He has resumed his normal dose of Demadex which is 20 in the morning and 10 at night.    PLAN : split night sleep study Cont demedex 20 AM and 10 PM Cards eval

## 2017-09-12 NOTE — Assessment & Plan Note (Signed)
This problem is chronic and uncontrolled.  He states he has started a "diet".  He would like to lose some weight.  His he does have a sweet tooth and likes cookies and cakes and other sweets although he does not like soda.   PLAN : follow weights and gave encouragement    Wt Readings from Last 3 Encounters:  09/11/17 214 lb 8 oz (97.3 kg)  09/01/17 215 lb 6.4 oz (97.7 kg)  08/25/17 215 lb 1.6 oz (97.6 kg)

## 2017-09-12 NOTE — Assessment & Plan Note (Signed)
See pul HTN.  PLAN : sleep study

## 2017-09-26 ENCOUNTER — Other Ambulatory Visit: Payer: Self-pay | Admitting: Internal Medicine

## 2017-10-02 ENCOUNTER — Encounter: Payer: Self-pay | Admitting: Internal Medicine

## 2017-10-02 ENCOUNTER — Ambulatory Visit (INDEPENDENT_AMBULATORY_CARE_PROVIDER_SITE_OTHER): Payer: Medicare Other | Admitting: Internal Medicine

## 2017-10-02 VITALS — BP 156/53 | HR 58 | Temp 98.7°F | Wt 207.1 lb

## 2017-10-02 DIAGNOSIS — I272 Pulmonary hypertension, unspecified: Secondary | ICD-10-CM

## 2017-10-02 DIAGNOSIS — N183 Chronic kidney disease, stage 3 unspecified: Secondary | ICD-10-CM

## 2017-10-02 DIAGNOSIS — E1122 Type 2 diabetes mellitus with diabetic chronic kidney disease: Secondary | ICD-10-CM | POA: Diagnosis not present

## 2017-10-02 DIAGNOSIS — G4733 Obstructive sleep apnea (adult) (pediatric): Secondary | ICD-10-CM | POA: Diagnosis not present

## 2017-10-02 DIAGNOSIS — R198 Other specified symptoms and signs involving the digestive system and abdomen: Secondary | ICD-10-CM | POA: Diagnosis not present

## 2017-10-02 DIAGNOSIS — D696 Thrombocytopenia, unspecified: Secondary | ICD-10-CM

## 2017-10-02 DIAGNOSIS — I129 Hypertensive chronic kidney disease with stage 1 through stage 4 chronic kidney disease, or unspecified chronic kidney disease: Secondary | ICD-10-CM | POA: Diagnosis present

## 2017-10-02 DIAGNOSIS — I152 Hypertension secondary to endocrine disorders: Secondary | ICD-10-CM

## 2017-10-02 DIAGNOSIS — E1159 Type 2 diabetes mellitus with other circulatory complications: Secondary | ICD-10-CM | POA: Diagnosis not present

## 2017-10-02 DIAGNOSIS — I10 Essential (primary) hypertension: Secondary | ICD-10-CM

## 2017-10-02 DIAGNOSIS — I779 Disorder of arteries and arterioles, unspecified: Secondary | ICD-10-CM | POA: Diagnosis not present

## 2017-10-02 LAB — PROTIME-INR
INR: 1.25
Prothrombin Time: 15.6 seconds — ABNORMAL HIGH (ref 11.4–15.2)

## 2017-10-02 LAB — APTT: aPTT: 35 seconds (ref 24–36)

## 2017-10-02 NOTE — Progress Notes (Signed)
   Subjective:    Patient ID: Dylan [name], male    DOB: [birth year], 72 y.o.   MRN: [id number]  HPI  Dylan [name] is here for Pul HTN F/U. Please see the A&P for the status of the pt's chronic medical problems.  ROS : per ROS section and in problem oriented charting. All other systems are negative.  PMHx, Soc hx, and / or Fam hx : Using O2 since 08-19-22. Sister died recently, h/o triple CABG when in her 32's. No fam h/o VTE.  Review of Systems  Constitutional: Positive for activity change. Negative for fever.  HENT: Positive for rhinorrhea and sneezing. Negative for postnasal drip.   Eyes: Positive for discharge. Negative for itching.  Respiratory: Positive for cough.        +DOE Productive cough  Cardiovascular: Positive for leg swelling. Negative for chest pain.  Gastrointestinal: Positive for abdominal distention. Negative for abdominal pain, constipation, diarrhea, nausea and vomiting.  Musculoskeletal: Positive for myalgias. Negative for arthralgias.       Myalgias for a few weeks        Objective:   Physical Exam  Constitutional: He appears well-developed and well-nourished. No distress.  HENT:  Head: Normocephalic and atraumatic.  Right Ear: External ear normal.  Left Ear: External ear normal.  Nose: Nose normal.  Eyes: Conjunctivae and EOM are normal. Right eye exhibits no discharge. Left eye exhibits no discharge. No scleral icterus.  Neck: Normal range of motion. Neck supple.  Cardiovascular: Normal rate and regular rhythm. Exam reveals no friction rub.  Murmur heard. 3/6 systolic murmur heard throughout   Pulmonary/Chest: Effort normal and breath sounds normal.  Abdominal: Bowel sounds are normal. He exhibits distension. There is no tenderness.  Musculoskeletal: He exhibits edema. He exhibits no tenderness.  Trace pre-tibial to mid shin B, no ABD or UE edema  Neurological: He is alert.  Skin: Skin is warm and dry. He is not diaphoretic.  Psychiatric: He has a normal  mood and affect. His behavior is normal. Judgment and thought content normal.          Assessment & Plan:

## 2017-10-02 NOTE — Assessment & Plan Note (Signed)
This problem is chronic and uncontrolled.  I reviewed his echo results with him today which are pretty much unchanged since his echo in 2014.  The echo showed a nl EF, grade 1 diastolic dysfunction, changes consistent with hypertension, and a pericardial effusion free-flowing along the left ventricular wall.  His right ventricular systolic function was normal but his right atrium showed severe dilation.  We discussed that these changes are likely secondary from obstructive sleep apnea.  I considered other etiologies.  He has no personal or family history of DVT.  He had a negative HIV test in 2014 when his echo first showed pulmonary hypertension.  He denies any signs or symptoms of rheumatologic condition.  His left cardiac function is normal.  He had PFTs ordered in 2017 but they were not completed.  I had ordered them because he was dyspneic on exertion and had prior tobacco use.  Overall, I think the cause of his pulmonary hypertension is most likely due to severe obstructive untreated obstructive sleep apnea and therefore I do not think it is necessary to get serologic workup for a rheumatologic condition, a malignancy condition, or PFTs.  PLAN : O2 with ambulation Sleep study Cards consult

## 2017-10-02 NOTE — Patient Instructions (Addendum)
1. I have ordered blood tests and an abdominal ultrasound to evaluate your liver. The ultrasound in 2014 showed very mild fatty changes 2. We are working on the sleep study. This is very important to get done. 3. Please see Dr Gwenlyn Found the heart doctor on 10/08/2017 4. Your heart ECHO showed changes consistent with pulmonary hypertension - high blood pressure in your lungs. This is most likely from long standing sleep apnea but the heart doctor will assess if you need further testing. Pulmonary hypertension may cause swelling in your legs and belly, shortness of breath, and fatigue. 5. Please make an appointment with me in one month for HTN / DM / Pul HTN F/U. 6. Please make an appointment with Dr Maudie Mercury, our pharmacist, anytime in next month (same day as ultrasound or my appointment) for medicine reconciliation. Please bring all medicine bottles to this appointment.

## 2017-10-02 NOTE — Assessment & Plan Note (Signed)
This problem is chronic and stable.  Today's appointment.  His most recent A1c was 6.3 this year.  He is prescribed glipizide 5 mg twice daily.  On med rec, he stated he was taking 2 pills in the morning and one pill at night.  He denies hypoglycemia symptoms since I stopped his insulin.  He only checks fasting CBGs and is unable to tolerate a continuous glucose monitor.  As other issues took precedence, I did not press the issue of checking postprandial CBGs but can do that in the future.  PLAN : Med rec with all med bottles with Dr Maudie Mercury in next 30 days Encourage postprandial CBGs at his next appointment.

## 2017-10-02 NOTE — Assessment & Plan Note (Signed)
This problem is chronic and is stable.  He relates a history of alcohol use.  He started drinking as a teenager and quit about 30 years ago.  He states when he drinks, he was a very poorly drinker.  His ultrasound of his 12 showed some parenchymal changes.  He also had some mild thrombocytopenia in the past.  He states he has noted increased abdominal girth for about 3 weeks and his daughter commented on his abdomen yesterday.  He would have many reasons to have ascites including new diagnosis of cirrhosis or right-sided heart failure from pulmonary hypertension.  I am going to check an albumin, PT, PTT, and platelets to assess for liver function.  I am also going to check an abdominal ultrasound to assess changes in the liver from either NASH or cirrhosis.  PLAN : PT, PTT, albumin, plts, abd u/s

## 2017-10-02 NOTE — Assessment & Plan Note (Signed)
This problem is chronic and uncontrolled.  He did not have his medicine bottles with him today and we attempted a medication reconciliation based on drug names but I am uncertain as to the accuracy of this.  He endorsed taking eplerenone 50 twice daily,   Labetalol 400 twice daily, verapamil 360 daily, minoxidil 20 daily, hydralazine 100 3 times daily.  He was uncertain of the Demadex dose.  He is prescribed 20 mg in the morning and 10 mg at night but he stated he thought he was taking 1 pill once a day.  I am hesitant to change blood pressure medications today as I have recently increased his eplerenone dose and his hydralazine dose.  I will start with the medication reconciliation in the next 30 days.  PLAN:  Cont current meds Med rec in next 30 days

## 2017-10-02 NOTE — Assessment & Plan Note (Signed)
See Pul HTN

## 2017-10-02 NOTE — Assessment & Plan Note (Signed)
This problem is chronic and stable.  Overall, his renal function has remained fairly stable over the past 9 years.  His GFR is in the low 40s.  I am rechecking a BMP today to ensure stability.  PLAN : Cr today

## 2017-10-03 ENCOUNTER — Encounter: Payer: Self-pay | Admitting: Internal Medicine

## 2017-10-03 LAB — CMP14 + ANION GAP
ALT: 17 IU/L (ref 0–44)
AST: 16 IU/L (ref 0–40)
Albumin/Globulin Ratio: 1.3 (ref 1.2–2.2)
Albumin: 4.3 g/dL (ref 3.5–4.8)
Alkaline Phosphatase: 65 IU/L (ref 39–117)
Anion Gap: 17 mmol/L (ref 10.0–18.0)
BUN/Creatinine Ratio: 14 (ref 10–24)
BUN: 25 mg/dL (ref 8–27)
Bilirubin Total: 0.4 mg/dL (ref 0.0–1.2)
CO2: 21 mmol/L (ref 20–29)
Calcium: 9.2 mg/dL (ref 8.6–10.2)
Chloride: 108 mmol/L — ABNORMAL HIGH (ref 96–106)
Creatinine, Ser: 1.83 mg/dL — ABNORMAL HIGH (ref 0.76–1.27)
GFR calc Af Amer: 41 mL/min/{1.73_m2} — ABNORMAL LOW (ref >59)
GFR calc non Af Amer: 36 mL/min/{1.73_m2} — ABNORMAL LOW (ref >59)
Globulin, Total: 3.2 g/dL (ref 1.5–4.5)
Glucose: 226 mg/dL — ABNORMAL HIGH (ref 65–99)
Potassium: 4 mmol/L (ref 3.5–5.2)
Sodium: 146 mmol/L — ABNORMAL HIGH (ref 134–144)
Total Protein: 7.5 g/dL (ref 6.0–8.5)

## 2017-10-03 LAB — CBC
Hematocrit: 37.8 % (ref 37.5–51.0)
Hemoglobin: 12.2 g/dL — ABNORMAL LOW (ref 13.0–17.7)
MCH: 31.1 pg (ref 26.6–33.0)
MCHC: 32.3 g/dL (ref 31.5–35.7)
MCV: 96 fL (ref 79–97)
Platelets: 158 10*3/uL (ref 150–379)
RBC: 3.92 x10E6/uL — ABNORMAL LOW (ref 4.14–5.80)
RDW: 13 % (ref 12.3–15.4)
WBC: 10.1 10*3/uL (ref 3.4–10.8)

## 2017-10-03 LAB — MICROALBUMIN / CREATININE URINE RATIO
Creatinine, Urine: 144.7 mg/dL
Microalb/Creat Ratio: 41.4 mg/g creat — ABNORMAL HIGH (ref 0.0–30.0)
Microalbumin, Urine: 59.9 ug/mL

## 2017-10-08 ENCOUNTER — Ambulatory Visit (INDEPENDENT_AMBULATORY_CARE_PROVIDER_SITE_OTHER): Payer: Medicare Other | Admitting: Cardiovascular Disease

## 2017-10-08 ENCOUNTER — Encounter: Payer: Self-pay | Admitting: Cardiovascular Disease

## 2017-10-08 VITALS — BP 144/60 | HR 65 | Ht 66.0 in | Wt 206.0 lb

## 2017-10-08 DIAGNOSIS — I272 Pulmonary hypertension, unspecified: Secondary | ICD-10-CM

## 2017-10-08 DIAGNOSIS — I5032 Chronic diastolic (congestive) heart failure: Secondary | ICD-10-CM

## 2017-10-08 DIAGNOSIS — I10 Essential (primary) hypertension: Secondary | ICD-10-CM | POA: Diagnosis not present

## 2017-10-08 DIAGNOSIS — E1159 Type 2 diabetes mellitus with other circulatory complications: Secondary | ICD-10-CM | POA: Diagnosis not present

## 2017-10-08 DIAGNOSIS — I152 Hypertension secondary to endocrine disorders: Secondary | ICD-10-CM

## 2017-10-08 NOTE — Patient Instructions (Signed)

## 2017-10-08 NOTE — Progress Notes (Signed)
10/08/2017 Dylan [name]   1946/03/24  [id number]  Primary Physician Bartholomew Crews, MD Primary Cardiologist: Lorretta Harp MD Lupe Carney, [place]  HPI:  Dylan [name] is a 72 y.o. mildly overweight divorced African-American male father of 63, grandfather of 3 grandchildren is accompanied by his daughter [name]  today. He was referred by Dr. Larey Dresser for evaluation of pulmonary hypertension. He has a history of remote tobacco abuse having quit 35 years ago and smoked for 20 years as well as remote ethanol abuse. 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 Cardiovascular risk factors include treated hypertension, diabetes and hyperlipidemia. He does have stage III chronic renal insufficiency followed by Dr. Justin Mend. Sister had bypass surgery. He has never had a heart attack or stroke. He denies chest pain but does get occasional shortness of breath on exertion He does have a post obstructive sleep apnea currently not treated. A 2-D echocardiogram performed 09/02/17 showed normal LV systolic function, severe concentric LVH with grade 1 diastolic dysfunction and moderately severe pulmonary hypertension. His peak pulmonary artery pressures were 65 mmHg.   Current Meds  Medication Sig  . albuterol (PROVENTIL HFA;VENTOLIN HFA) 108 (90 BASE) MCG/ACT inhaler Inhale 1-2 puffs into the lungs every 6 (six) hours as needed for wheezing or shortness of breath.  . allopurinol (ZYLOPRIM) 100 MG tablet Take 1 tablet (100 mg total) by mouth daily.  Marland Kitchen alum & mag hydroxide-simeth (MAALOX/MYLANTA) 200-200-20 MG/5ML suspension Take 30 mLs by mouth every 6 (six) hours as needed for indigestion or heartburn (dyspepsia).  Marland Kitchen aspirin 81 MG chewable tablet Chew 1 tablet (81 mg total) by mouth daily.  . colchicine 0.6 MG tablet TAKE ONE TABLET BY MOUTH ONCE DAILY  . docusate sodium (COLACE) 100 MG capsule Take 1 capsule  (100 mg total) by mouth every 12 (twelve) hours.  Marland Kitchen eplerenone (INSPRA) 50 MG tablet Take 1 tablet (50 mg total) by mouth 2 (two) times daily.  Marland Kitchen FREESTYLE LITE test strip CHECK BLOOD SUGAR BEFORE MEALS AND BEDTIME AS DIRECTED  . glipiZIDE (GLUCOTROL) 5 MG tablet Take 1 tablet (5 mg total) by mouth 2 (two) times daily before a meal.  . hydrALAZINE (APRESOLINE) 50 MG tablet Take 2 tablets (100 mg total) by mouth 3 (three) times daily.  Marland Kitchen ketotifen (ZADITOR) 0.025 % ophthalmic solution Place 1 drop into both eyes 2 (two) times daily.  Marland Kitchen labetalol (NORMODYNE) 200 MG tablet Take 2 tablets (400 mg total) by mouth 2 (two) times daily.  Marland Kitchen loratadine (CLARITIN) 10 MG tablet Take 1 tablet (10 mg total) by mouth daily.  . minoxidil (LONITEN) 10 MG tablet TAKE TWO TABLETS BY MOUTH ONCE DAILY (Patient taking differently: TAKE 20 mg TABLETS BY MOUTH ONCE DAILY)  . potassium chloride 20 MEQ TBCR Take 20 mEq by mouth daily.  . pravastatin (PRAVACHOL) 40 MG tablet Take 1 tablet (40 mg total) by mouth every evening.  . torsemide (DEMADEX) 10 MG tablet Take 4 tablets in morning and 1 tablet in the evening  . torsemide (DEMADEX) 10 MG tablet Take 2 tablets (20 mg total) by mouth every morning AND 1 tablet (10 mg total) every evening.  . triamcinolone (NASACORT AQ) 55 MCG/ACT AERO nasal inhaler Place 2 sprays into the nose daily.  . verapamil (VERELAN PM) 360 MG 24 hr capsule Take 1 capsule (360 mg total) by mouth daily.  Allergies  Allergen Reactions  . Ace Inhibitors Other (See Comments)    ARF - see CRF overview  . Spironolactone Other (See Comments)    Gynecomastia per pt report.    Social History   Socioeconomic History  . Marital status: Divorced    Spouse name: Not on file  . Number of children: Not on file  . Years of education: Not on file  . Highest education level: Not on file  Occupational History  . Not on file  Social Needs  . Financial resource strain: Not on file  . Food  insecurity:    Worry: Not on file    Inability: Not on file  . Transportation needs:    Medical: Not on file    Non-medical: Not on file  Tobacco Use  . Smoking status: Former Smoker    Last attempt to quit: 10/02/1972    Years since quitting: 45.0  . Smokeless tobacco: Never Used  Substance and Sexual Activity  . Alcohol use: No    Alcohol/week: 0.0 oz  . Drug use: No  . Sexual activity: Never  Lifestyle  . Physical activity:    Days per week: Not on file    Minutes per session: Not on file  . Stress: Not on file  Relationships  . Social connections:    Talks on phone: Not on file    Gets together: Not on file    Attends religious service: Not on file    Active member of club or organization: Not on file    Attends meetings of clubs or organizations: Not on file    Relationship status: Not on file  . Intimate partner violence:    Fear of current or ex partner: Not on file    Emotionally abused: Not on file    Physically abused: Not on file    Forced sexual activity: Not on file  Other Topics Concern  . 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 Had yearly occupational testing inc PFT's. Now retired. Divorced. Has male sig other. Likes to go fishing.      He starting drinking as a teen and would drink to passing out. Couldn't keep job, have family. He quit drinking and smoking cold [place] with help of his faith. No ETOH since about age 25ish      Total of 7 brothers and 6 sisters.     Review of Systems: General: negative for chills, fever, night sweats or weight changes.  Cardiovascular: negative for chest pain, dyspnea on exertion, edema, orthopnea, palpitations, paroxysmal nocturnal dyspnea or shortness of breath Dermatological: negative for rash Respiratory: negative for cough or wheezing Urologic: negative for hematuria Abdominal: negative for nausea, vomiting, diarrhea, bright red blood per rectum, melena, or  hematemesis Neurologic: negative for visual changes, syncope, or dizziness All other systems reviewed and are otherwise negative except as noted above.    Blood pressure (!) 144/60, pulse 65, height _0  (1.676 m), weight 206 lb (93.4 kg).  General appearance: alert and no distress Neck: no adenopathy, no carotid bruit, no JVD, supple, symmetrical, trachea midline and thyroid not enlarged, symmetric, no tenderness/mass/nodules Lungs: clear to auscultation bilaterally Heart: regular rate and rhythm, S1, S2 normal, no murmur, click, rub or gallop Extremities: extremities normal, atraumatic, no cyanosis or edema Pulses: 2+ and symmetric Skin: Skin color, texture, turgor normal. No rashes or lesions Neurologic: Alert and oriented X 3, normal strength and tone. Normal symmetric reflexes.  Normal coordination and gait  EKG sinus rhythm at 65 with left atrial enlargement, low limb voltage and poor R-wave progression. I personally reviewed this EKG.  ASSESSMENT AND PLAN:   Hypertension associated with diabetes ([place]) History of essential hypertension blood pressure measured 144/60. He is on minoxidil, labetalol, hydralazine, and verapamil. Continue current meds at current dosing.  Pulmonary HTN ([place]) History of pulmonary hypertension by 2-D echo performed 09/02/17 with pulmonary artery pressures of 65 mmHg. This is probably explainable by untreated obstructive sleep apnea and remote tobacco abuse. He is minimally symptomatic.  Chronic diastolic heart failure History of diastolic heart failure with grade 1 diastolic dysfunction and severe concentric LVH on Inspra and torsemide with minimal edema.      Lorretta Harp MD FACP,FACC,FAHA, [facility] 10/08/2017 10:57 AM

## 2017-10-08 NOTE — Assessment & Plan Note (Signed)
History of essential hypertension blood pressure measured 144/60. He is on minoxidil, labetalol, hydralazine, and verapamil. Continue current meds at current dosing.

## 2017-10-08 NOTE — Assessment & Plan Note (Signed)
History of diastolic heart failure with grade 1 diastolic dysfunction and severe concentric LVH on Inspra and torsemide with minimal edema.

## 2017-10-08 NOTE — Assessment & Plan Note (Signed)
History of pulmonary hypertension by 2-D echo performed 09/02/17 with pulmonary artery pressures of 65 mmHg. This is probably explainable by untreated obstructive sleep apnea and remote tobacco abuse. He is minimally symptomatic.

## 2017-10-30 ENCOUNTER — Encounter: Payer: Self-pay | Admitting: Internal Medicine

## 2017-10-30 ENCOUNTER — Ambulatory Visit: Payer: Medicare Other | Admitting: Pharmacist

## 2017-10-30 ENCOUNTER — Ambulatory Visit (INDEPENDENT_AMBULATORY_CARE_PROVIDER_SITE_OTHER): Payer: Medicare Other | Admitting: Internal Medicine

## 2017-10-30 DIAGNOSIS — H0520 Unspecified exophthalmos: Secondary | ICD-10-CM | POA: Diagnosis not present

## 2017-10-30 DIAGNOSIS — I70209 Unspecified atherosclerosis of native arteries of extremities, unspecified extremity: Secondary | ICD-10-CM

## 2017-10-30 DIAGNOSIS — I272 Pulmonary hypertension, unspecified: Secondary | ICD-10-CM

## 2017-10-30 DIAGNOSIS — R011 Cardiac murmur, unspecified: Secondary | ICD-10-CM

## 2017-10-30 DIAGNOSIS — I10 Essential (primary) hypertension: Secondary | ICD-10-CM

## 2017-10-30 DIAGNOSIS — Z9989 Dependence on other enabling machines and devices: Secondary | ICD-10-CM | POA: Diagnosis not present

## 2017-10-30 DIAGNOSIS — E1129 Type 2 diabetes mellitus with other diabetic kidney complication: Secondary | ICD-10-CM | POA: Diagnosis not present

## 2017-10-30 DIAGNOSIS — Z79899 Other long term (current) drug therapy: Secondary | ICD-10-CM | POA: Diagnosis not present

## 2017-10-30 DIAGNOSIS — G4733 Obstructive sleep apnea (adult) (pediatric): Secondary | ICD-10-CM | POA: Diagnosis not present

## 2017-10-30 DIAGNOSIS — Z87891 Personal history of nicotine dependence: Secondary | ICD-10-CM | POA: Diagnosis not present

## 2017-10-30 DIAGNOSIS — Z7982 Long term (current) use of aspirin: Secondary | ICD-10-CM

## 2017-10-30 DIAGNOSIS — Z7984 Long term (current) use of oral hypoglycemic drugs: Secondary | ICD-10-CM | POA: Diagnosis not present

## 2017-10-30 DIAGNOSIS — E1159 Type 2 diabetes mellitus with other circulatory complications: Secondary | ICD-10-CM | POA: Diagnosis not present

## 2017-10-30 DIAGNOSIS — Z0000 Encounter for general adult medical examination without abnormal findings: Secondary | ICD-10-CM

## 2017-10-30 DIAGNOSIS — I152 Hypertension secondary to endocrine disorders: Secondary | ICD-10-CM

## 2017-10-30 DIAGNOSIS — N289 Disorder of kidney and ureter, unspecified: Secondary | ICD-10-CM | POA: Diagnosis not present

## 2017-10-30 DIAGNOSIS — E1121 Type 2 diabetes mellitus with diabetic nephropathy: Secondary | ICD-10-CM

## 2017-10-30 MED ORDER — MINOXIDIL 10 MG PO TABS: 20.0000 mg | ORAL_TABLET | Freq: Every day | ORAL | 3 refills | Status: DC

## 2017-10-30 MED ORDER — LABETALOL HCL 200 MG PO TABS: 400.0000 mg | ORAL_TABLET | Freq: Two times a day (BID) | ORAL | 3 refills | Status: DC

## 2017-10-30 MED ORDER — PRAVASTATIN SODIUM 40 MG PO TABS: 40.0000 mg | ORAL_TABLET | Freq: Every evening | ORAL | 3 refills | Status: DC

## 2017-10-30 MED ORDER — ALBUTEROL SULFATE HFA 108 (90 BASE) MCG/ACT IN AERS: 1.0000 | INHALATION_SPRAY | Freq: Four times a day (QID) | RESPIRATORY_TRACT | 0 refills | Status: DC | PRN

## 2017-10-30 MED ORDER — TORSEMIDE 10 MG PO TABS: ORAL_TABLET | ORAL | 0 refills | Status: DC

## 2017-10-30 NOTE — Assessment & Plan Note (Signed)
This problem is chronic and stable. BP is at goal today. Brought all meds.  On eplerenone 50 twice daily,  Labetalol 400 twice daily, verapamil 360 daily, minoxidil 20 daily, hydralazine 100 3 times daily, Demadex 20 mg in the morning and 10 mg at night. No SE. Dr Julianne Rice pharmacy team doing med rec.  PLAN:  Cont current meds   BP Readings from Last 3 Encounters:  10/30/17 (!) 131/54  10/08/17 (!) 144/60  10/02/17 (!) 156/53

## 2017-10-30 NOTE — Assessment & Plan Note (Signed)
This problem is chronic and uncontrolled. He saw Dr Gwenlyn Found and I have reviewed his notes - agree with dx, need for sleep study, no further W/U or tx rec. Await sleep study results.  PLAN : has sleep study sch

## 2017-10-30 NOTE — Progress Notes (Signed)
Patient was seen in clinic with Wynell Balloon, PharmD, PGY1 pharmacy residentand Evangeline Gula, PharmD Candidate. I agree with the assessment and plan of care documented.

## 2017-10-30 NOTE — Assessment & Plan Note (Signed)
This problem is chronic and uncontrolled. He has CPAP machine but doesn't use it. Sleep study 2016 showed severe OSA, likely the cause of his Pul HTN. Has split night study sch.  PLAN : await results Would prefer nasal mask than face mask

## 2017-10-30 NOTE — Progress Notes (Signed)
S: Dylan [name] is a 72 y.o. male reports to clinical pharmacist appointment for medication reconcilliation. Patient did bring medication bottles.  Allergies  Allergen Reactions  . Ace Inhibitors Other (See Comments)    ARF - see CRF overview  . Spironolactone Other (See Comments)    Gynecomastia per pt report.     A/P: [name] understands his medications well and how often to take each of them. Reviewed and updated the Medication record in Epic as needed. Pt requested a refill of his albuterol inhaler "just in case" but has not required it for rescue.  Has not been taking the Mylanta and denies heartburn/GERD symptoms. Reports some fatigue in the morning after taking his medications.  Patterson Hammersmith PharmD PGY1 Pharmacy Practice Resident 10/30/2017 9:59 AM   15 minutes spent face-to-face with the patient during the encounter. 25% of time spent on education. 75% of time was spent on medication reconciliation and documentation.

## 2017-10-30 NOTE — Assessment & Plan Note (Signed)
This problem is chronic and stable. A1C in March was 6.3 on Glipizide 5 BID. A1C remains controlled off insulin. I am still worried about hypoglycemia and post-prandial hyperglycemia but pt unable to tolerate the CGM and only checks fasting CBG. Due to PAD, will discuss liraglutide at next appt.  PLAN : cont glip but discuss liraglutide next appt

## 2017-10-30 NOTE — Progress Notes (Signed)
   Subjective:    Patient ID: Dylan [name], male    DOB: 1946/01/24, 72 y.o.   MRN: [id number]  HPI  [name] is here for Pul HTN F/U. Please see the A&P for the status of the pt's chronic medical problems.  ROS : per ROS section and in problem oriented charting. All other systems are negative.  PMHx, Soc hx, and / or Fam hx : Has an old CPAP machine, talked to woman at church who explained now has nose masks. He sleeps with mouth open.  Review of Systems  Constitutional: Negative for unexpected weight change.  Respiratory: Positive for shortness of breath.   Musculoskeletal:       No gout sxs       Objective:   Physical Exam  Constitutional: He appears well-developed and well-nourished. No distress.  HENT:  Head: Normocephalic and atraumatic.  Right Ear: External ear normal.  Left Ear: External ear normal.  Nose: Nose normal.  Chronic proptosis B  Eyes: Conjunctivae and EOM are normal. Right eye exhibits no discharge. Left eye exhibits no discharge. No scleral icterus.  Cardiovascular: Normal rate, regular rhythm and normal heart sounds.  End systolic murmur and prominent S2 at LSB  Pulmonary/Chest: Effort normal.  Musculoskeletal: He exhibits no tenderness or deformity.  Neurological: He is alert.  Skin: Skin is warm and dry. He is not diaphoretic.  Psychiatric: He has a normal mood and affect. His behavior is normal. Judgment and thought content normal.      Assessment & Plan:

## 2017-10-30 NOTE — Patient Instructions (Addendum)
1. I sent all of your refills to [facility] on [address] 2. See me in 2-3 months for DM and HTN follow up 3. Get your sleep study and stomach ultrasound (picture)

## 2017-11-04 ENCOUNTER — Ambulatory Visit (HOSPITAL_COMMUNITY): Payer: Medicare Other

## 2017-11-10 ENCOUNTER — Ambulatory Visit (HOSPITAL_COMMUNITY)
Admission: RE | Admit: 2017-11-10 | Discharge: 2017-11-10 | Disposition: A | Discharge status: Home / Self Care | Payer: Medicare Other | Source: Ambulatory Visit | Attending: Internal Medicine | Admitting: Internal Medicine

## 2017-11-10 ENCOUNTER — Encounter: Payer: Self-pay | Admitting: Internal Medicine

## 2017-11-10 DIAGNOSIS — R198 Other specified symptoms and signs involving the digestive system and abdomen: Secondary | ICD-10-CM | POA: Insufficient documentation

## 2017-11-10 DIAGNOSIS — K760 Fatty (change of) liver, not elsewhere classified: Secondary | ICD-10-CM | POA: Diagnosis not present

## 2017-11-10 DIAGNOSIS — K861 Other chronic pancreatitis: Secondary | ICD-10-CM | POA: Insufficient documentation

## 2017-11-19 ENCOUNTER — Ambulatory Visit (HOSPITAL_BASED_OUTPATIENT_CLINIC_OR_DEPARTMENT_OTHER): Payer: Medicare Other | Attending: Internal Medicine | Admitting: Internal Medicine

## 2017-11-19 VITALS — Ht 66.0 in | Wt 207.0 lb

## 2017-11-19 DIAGNOSIS — R0902 Hypoxemia: Secondary | ICD-10-CM | POA: Diagnosis not present

## 2017-11-19 DIAGNOSIS — G4733 Obstructive sleep apnea (adult) (pediatric): Secondary | ICD-10-CM | POA: Diagnosis not present

## 2017-11-25 DIAGNOSIS — G4733 Obstructive sleep apnea (adult) (pediatric): Secondary | ICD-10-CM

## 2017-11-25 NOTE — Procedures (Signed)
Patient Name: Kaitlyn, [place]ron Date: 11/19/2017 Gender: Male D.O.B: February 14, 1946 Age (years): 22 Referring Provider: Bartholomew Crews Height (inches): 71 Interpreting Physician: Baird Lyons MD, ABSM Weight (lbs): 207 RPSGT: Carolin Coy BMI: 33 MRN: [id number] Neck Size: 17.00  CLINICAL INFORMATION Sleep Study Type: NPSG Indication for sleep study: Diabetes, Hypertension, Snoring  Epworth Sleepiness Score: 5  SLEEP STUDY TECHNIQUE As per the AASM Manual for the Scoring of Sleep and Associated Events v2.3 (April 2016) with a hypopnea requiring 4% desaturations.  The channels recorded and monitored were frontal, central and occipital EEG, electrooculogram (EOG), submentalis EMG (chin), nasal and oral airflow, thoracic and abdominal wall motion, anterior tibialis EMG, snore microphone, electrocardiogram, and pulse oximetry.  MEDICATIONS Medications self-administered by patient taken the night of the study : EPERENONE, GLIPIZIDE, HYDRALAZINE, LABETALOL, PRAVASTATIN, TORSEMIDE  SLEEP ARCHITECTURE The study was initiated at 10:18:42 PM and ended at 4:56:38 AM.  Sleep onset time was 26.0 minutes and the sleep efficiency was 33.7%%. The total sleep time was 134 minutes.  Stage REM latency was N/A minutes.  The patient spent 87.3%% of the night in stage N1 sleep, 12.7%% in stage N2 sleep, 0.0%% in stage N3 and 0.00% in REM.  Alpha intrusion was absent.  Supine sleep was 69.04%.  RESPIRATORY PARAMETERS The overall apnea/hypopnea index (AHI) was 131.2 per hour. There were 236 total apneas, including 211 obstructive, 9 central and 16 mixed apneas. There were 57 hypopneas and 21 RERAs.  The AHI during Stage REM sleep was N/A per hour.  AHI while supine was 136.2 per hour.  The mean oxygen saturation was 92.2%. The minimum SpO2 during sleep was 85.0%.  moderate snoring was noted during this study.  CARDIAC DATA The 2 lead EKG demonstrated sinus rhythm. The mean heart  rate was 59.7 beats per minute. Other EKG findings include: PVCs.  LEG MOVEMENT DATA The total PLMS were 0 with a resulting PLMS index of 0.0. Associated arousal with leg movement index was 0.0 .  IMPRESSIONS - Severe obstructive sleep apnea occurred during this study (AHI = 131.2/h). - No significant central sleep apnea occurred during this study (CAI = 4.0/h). - Oxygen desaturation was noted during this study (Min O2 = 85.0%). - The patient snored with moderate snoring volume. - EKG findings include PVCs. - Clinically significant periodic limb movements did not occur during sleep. No significant associated arousals.  DIAGNOSIS - Obstructive Sleep Apnea (327.23 [G47.33 ICD-10]) - Nocturnal Hypoxemia (327.26 [G47.36 ICD-10])  RECOMMENDATIONS - Suggest CPAP titration sleep study or DME autopap. Other options would based on clinical judgment. - Positional therapy avoiding supine position during sleep. - Be careful with alcohol, sedatives and other CNS depressants that may worsen sleep apnea and disrupt normal sleep architecture. - Sleep hygiene should be reviewed to assess factors that may improve sleep quality. - Weight management and regular exercise should be initiated or continued if appropriate.  [Electronically signed] 11/25/2017 02:36 PM  Baird Lyons MD, [place], American Board of Sleep Medicine   NPI: [id number]                          [place], [place] [facility]  ELECTRONICALLY SIGNED ON:  11/25/2017, 2:32 PM [place] PH: [phone]   FX: [phone] [place]

## 2017-11-26 ENCOUNTER — Other Ambulatory Visit: Payer: Self-pay | Admitting: Internal Medicine

## 2017-11-26 DIAGNOSIS — G4733 Obstructive sleep apnea (adult) (pediatric): Secondary | ICD-10-CM

## 2017-11-26 NOTE — Progress Notes (Signed)
Will order DME for autopap to save pt another sleep study and increase compliance  Once I know insurance will cover autopap, I will cancel 2nd sleep study.

## 2017-12-03 ENCOUNTER — Telehealth: Payer: Self-pay | Admitting: Internal Medicine

## 2017-12-03 NOTE — Progress Notes (Signed)
Orders were faxed to [facility] [phone].  If the patient has any questions please have him to call [phone].

## 2017-12-03 NOTE — Telephone Encounter (Signed)
Rec'd call from [facility].  Order was rec'd but they are unable to supply the patient until a new Order is placed.Order must inclue a Pressure setting 4-20 with a comment listed on the DME ORDER for a 2 week Download and they will be able to get a correct pressure setting for the patient.  [facility] would also ike to know if you would like to Bleed O2 into the CPAP which is a liter flow of 2, if so that needs to be added to the order as well.  This will all help prevent the patient from having to do another titration study.  Please advise.

## 2017-12-04 ENCOUNTER — Other Ambulatory Visit: Payer: Self-pay | Admitting: Internal Medicine

## 2017-12-04 DIAGNOSIS — Z9989 Dependence on other enabling machines and devices: Secondary | ICD-10-CM

## 2017-12-04 DIAGNOSIS — G4733 Obstructive sleep apnea (adult) (pediatric): Secondary | ICD-10-CM

## 2017-12-04 NOTE — Telephone Encounter (Signed)
Done

## 2017-12-05 NOTE — Telephone Encounter (Signed)
Thanks

## 2017-12-17 ENCOUNTER — Other Ambulatory Visit: Payer: Self-pay

## 2017-12-17 NOTE — Telephone Encounter (Signed)
Called [facility] and verified pt has good script there, they have filled and will call pt for pickup

## 2017-12-17 NOTE — Telephone Encounter (Signed)
pravastatin (PRAVACHOL) 40 MG tablet, Refill request @ [facility] on [address].

## 2017-12-22 ENCOUNTER — Other Ambulatory Visit: Payer: Self-pay | Admitting: Internal Medicine

## 2017-12-22 NOTE — Telephone Encounter (Signed)
Refill Request   minoxidil (LONITEN) 10 MG tablet

## 2017-12-22 NOTE — Telephone Encounter (Signed)
Patient is requesting refill on a medication, line disconnect unsure which medicine

## 2017-12-22 NOTE — Telephone Encounter (Signed)
Attempted to call pt to ask which pharm he uses, dr Software engineer sent script for minioxidil #180 w/ 3 refills to [facility] 10/30/17. Will call again after lunch

## 2017-12-23 NOTE — Telephone Encounter (Signed)
Spoke to pt, he will check at [facility]

## 2017-12-25 ENCOUNTER — Encounter: Payer: Self-pay | Admitting: Internal Medicine

## 2017-12-25 ENCOUNTER — Encounter (INDEPENDENT_AMBULATORY_CARE_PROVIDER_SITE_OTHER): Payer: Self-pay

## 2017-12-25 ENCOUNTER — Other Ambulatory Visit: Payer: Self-pay

## 2017-12-25 ENCOUNTER — Ambulatory Visit (INDEPENDENT_AMBULATORY_CARE_PROVIDER_SITE_OTHER): Payer: Medicare Other | Admitting: Internal Medicine

## 2017-12-25 VITALS — BP 164/71 | HR 54 | Temp 98.4°F | Wt 206.6 lb

## 2017-12-25 DIAGNOSIS — E1121 Type 2 diabetes mellitus with diabetic nephropathy: Secondary | ICD-10-CM | POA: Diagnosis not present

## 2017-12-25 DIAGNOSIS — R011 Cardiac murmur, unspecified: Secondary | ICD-10-CM | POA: Diagnosis not present

## 2017-12-25 DIAGNOSIS — Z87891 Personal history of nicotine dependence: Secondary | ICD-10-CM | POA: Diagnosis not present

## 2017-12-25 DIAGNOSIS — Z7984 Long term (current) use of oral hypoglycemic drugs: Secondary | ICD-10-CM

## 2017-12-25 DIAGNOSIS — Z0000 Encounter for general adult medical examination without abnormal findings: Secondary | ICD-10-CM

## 2017-12-25 DIAGNOSIS — Z7982 Long term (current) use of aspirin: Secondary | ICD-10-CM | POA: Diagnosis not present

## 2017-12-25 DIAGNOSIS — Z9849 Cataract extraction status, unspecified eye: Secondary | ICD-10-CM

## 2017-12-25 DIAGNOSIS — N189 Chronic kidney disease, unspecified: Secondary | ICD-10-CM

## 2017-12-25 DIAGNOSIS — I152 Hypertension secondary to endocrine disorders: Secondary | ICD-10-CM

## 2017-12-25 DIAGNOSIS — E876 Hypokalemia: Secondary | ICD-10-CM

## 2017-12-25 DIAGNOSIS — E1159 Type 2 diabetes mellitus with other circulatory complications: Secondary | ICD-10-CM

## 2017-12-25 DIAGNOSIS — E1122 Type 2 diabetes mellitus with diabetic chronic kidney disease: Secondary | ICD-10-CM | POA: Diagnosis not present

## 2017-12-25 DIAGNOSIS — M7989 Other specified soft tissue disorders: Secondary | ICD-10-CM

## 2017-12-25 DIAGNOSIS — Z79899 Other long term (current) drug therapy: Secondary | ICD-10-CM

## 2017-12-25 DIAGNOSIS — M25562 Pain in left knee: Secondary | ICD-10-CM

## 2017-12-25 DIAGNOSIS — Z9989 Dependence on other enabling machines and devices: Secondary | ICD-10-CM

## 2017-12-25 DIAGNOSIS — G4733 Obstructive sleep apnea (adult) (pediatric): Secondary | ICD-10-CM

## 2017-12-25 DIAGNOSIS — I10 Essential (primary) hypertension: Secondary | ICD-10-CM

## 2017-12-25 DIAGNOSIS — I129 Hypertensive chronic kidney disease with stage 1 through stage 4 chronic kidney disease, or unspecified chronic kidney disease: Secondary | ICD-10-CM

## 2017-12-25 LAB — GLUCOSE, CAPILLARY: Glucose-Capillary: 141 mg/dL — ABNORMAL HIGH (ref 65–99)

## 2017-12-25 LAB — POCT GLYCOSYLATED HEMOGLOBIN (HGB A1C): Hemoglobin A1C: 6 % — AB (ref 4.0–5.6)

## 2017-12-25 MED ORDER — POTASSIUM CHLORIDE ER 20 MEQ PO TBCR: 20.0000 meq | EXTENDED_RELEASE_TABLET | Freq: Every day | ORAL | 0 refills | Status: DC

## 2017-12-25 MED ORDER — VERAPAMIL HCL ER 360 MG PO CP24: 360.0000 mg | ORAL_CAPSULE | Freq: Every day | ORAL | 3 refills | Status: DC

## 2017-12-25 MED ORDER — DICLOFENAC SODIUM 1 % TD GEL: 4.0000 g | Freq: Four times a day (QID) | TRANSDERMAL | 5 refills | Status: DC

## 2017-12-25 NOTE — Patient Instructions (Signed)
1. Your sugar pill can cause low sugars. There are other better medicines that will not cause low sugars. Thank about changing your medicine 2. You need to sere your eye doctor 3. Try the cream on your left knee

## 2017-12-25 NOTE — Progress Notes (Signed)
   Subjective:    Patient ID: Dylan [name], male    DOB: [birth year], 72 y.o.   MRN: [id number]  HPI  Dylan [name] is here for DM F/U. Please see the A&P for the status of the pt's chronic medical problems.  ROS : per ROS section and in problem oriented charting. All other systems are negative.  PMHx, Soc hx, and / or Fam hx : Had a cataract removed recently. 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  Review of Systems  Constitutional: Negative for unexpected weight change.  Musculoskeletal: Positive for arthralgias and joint swelling.       Objective:   Physical Exam  Constitutional: He appears well-developed and well-nourished. No distress.  HENT:  Head: Normocephalic and atraumatic.  Right Ear: External ear normal.  Left Ear: External ear normal.  Nose: Nose normal.  Mouth/Throat: No oropharyngeal exudate.  Eyes: Right eye exhibits no discharge. Left eye exhibits no discharge. No scleral icterus.  Cardiovascular: Normal rate and regular rhythm.  Murmur heard. Pulmonary/Chest: Effort normal and breath sounds normal.  Musculoskeletal: He exhibits no edema, tenderness or deformity.  About a 2 cm area of soft tissue edema and mild tenderness medial inf knee  Skin: Skin is warm and dry. No rash noted. He is not diaphoretic. No erythema. No pallor.  Psychiatric: He has a normal mood and affect. His behavior is normal. Judgment and thought content normal.       Assessment & Plan:

## 2017-12-25 NOTE — Assessment & Plan Note (Signed)
This problem is chronic and uncontrolled. On eplerenone 50 twice daily,Labetalol 400 twice daily, verapamil 360 daily, minoxidil 20 daily, hydralazine 100 3 times daily, Demadex 20 mg in the morning and 10 mg at night.  He had all of his medicines today and I have no concern about nonadherence.  His blood pressure has been well controlled but is elevated today.  He states he did not take his medications today because he waits until he eats to take his medications.  Because he was well controlled in April, I am not going to make any medication changes today and will recheck his blood pressure at his next appointment in 3 months.  PLAN:  Cont current meds    BP Readings from Last 3 Encounters:  12/25/17 (!) 164/71  10/30/17 (!) 131/54  10/08/17 (!) 144/60

## 2017-12-25 NOTE — Assessment & Plan Note (Signed)
This problem is chronic and stable. His A1c trend has been 6.3 -6.0 today.  He is on glipizide 5 mg twice daily.  Because of hypoglycemia, I had stopped his insulin. He continues to tell me he has hypoglycemia.  He states on Friday morning, his sugar was 92 and he felt okay.  It is possible that his A1c is falsely low due to his chronic kidney disease.  It is also possible that his A1c is so low due to both hyperglycemia and hypoglycemia.  I had try a continuous glucose monitor on him in the recent past and he was not able to tolerate it.  His glucose monitor shows a handful of readings either fasting in the morning or before bedtime.  Fastings are generally uncontrolled with most around 160.  Bedtimes ranged from 104 up to 170.  I am concerned that he does not have good glucose control.  Continuous glucose monitor is not an option in him.  I have proposed to him today that I stop the glipizide and use a different medication either an SGLT2 or a GLP-1 or DPP4 but he was not interested in stopping his glipizide.  He is willing to think about it.  PLAN : cont meds Revisit med change next appt

## 2017-12-25 NOTE — Assessment & Plan Note (Signed)
This problem is new.  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  After the second night, he woke up and was very stiff, with left-sided pain, and was unable to get out of bed to go to church.  All of those symptoms have now resolved.  On the evening of the 18th, he went to bed and then woke up with his left knee stiff and sore.  It is on the medial inferior aspect and he thinks it is swollen.  He denies any falls or trauma to this area.  On examination, there is some soft tissue swelling in the upper leg, at the inferior medial knee joint.  There does not appear to be any joint effusion.  There is tenderness to palpation in the area.  I think he has a bit of a tendinitis likely related to his new activity.  I prescribed him Voltaren gel to help with the pain and also reduce the swelling as he is not able to take an oral NSAID.  PLAN : Voltaren gel to L knee

## 2017-12-25 NOTE — Assessment & Plan Note (Signed)
This problem is chronic and uncontrolled.  He had severe sleep apnea on his sleep study earlier this year.  He was not able to undergo a split night sleep study and therefore I ordered AutoPap at home to determine settings.  He states the company did call him to get this set up but they required a credit card and as he does not have a clinic card he did not follow-up.  I had our DME expert speak to the patient today to figure out how we can get him AutoPap so that he can return to using CPAP.  PLAN : F/U on autopap

## 2018-01-01 ENCOUNTER — Telehealth: Payer: Self-pay | Admitting: *Deleted

## 2018-01-01 NOTE — Telephone Encounter (Addendum)
Information was sent to CoverMyMeds for [place] for Diclofenac Gel.  Awaiting determination from [place].  Sander Nephew, RN 4:54 PM. Prior Authorization for Diclofenac Gel was approved 07/06/2017 thru 07/07/2018. Sander Nephew, RN 01/02/2018 9;13 AM.

## 2018-01-07 LAB — HM DIABETES EYE EXAM

## 2018-01-13 ENCOUNTER — Encounter: Payer: Self-pay | Admitting: *Deleted

## 2018-01-23 ENCOUNTER — Telehealth: Payer: Self-pay | Admitting: Internal Medicine

## 2018-01-28 ENCOUNTER — Other Ambulatory Visit: Payer: Self-pay | Admitting: Internal Medicine

## 2018-01-28 NOTE — Telephone Encounter (Signed)
Called pharmacy, pharmacist had just spoken w/ pt and informed him that he had refills, they have filled and he will pickup

## 2018-01-28 NOTE — Telephone Encounter (Signed)
Refill Request   labetalol (NORMODYNE) 200 MG tablet

## 2018-02-18 ENCOUNTER — Other Ambulatory Visit: Payer: Self-pay | Admitting: Internal Medicine

## 2018-02-18 DIAGNOSIS — N183 Chronic kidney disease, stage 3 unspecified: Secondary | ICD-10-CM

## 2018-02-18 DIAGNOSIS — E1122 Type 2 diabetes mellitus with diabetic chronic kidney disease: Secondary | ICD-10-CM

## 2018-02-18 DIAGNOSIS — Z794 Long term (current) use of insulin: Secondary | ICD-10-CM

## 2018-02-19 ENCOUNTER — Other Ambulatory Visit: Payer: Self-pay | Admitting: Internal Medicine

## 2018-02-19 NOTE — Telephone Encounter (Signed)
#  100 with 12 refills sent today by PCP. Hubbard Hartshorn, RN, BSN

## 2018-02-19 NOTE — Telephone Encounter (Signed)
FREESTYLE LIGHT, TESTING STRIPS, [phone],

## 2018-02-26 ENCOUNTER — Other Ambulatory Visit: Payer: Self-pay | Admitting: Internal Medicine

## 2018-02-26 NOTE — Telephone Encounter (Signed)
Needs refill on verapamil (VERELAN PM) 360 MG 24 hr capsule @ [facility]; asked pharmacy- no refills on bottle; pt contact# [phone]

## 2018-02-26 NOTE — Telephone Encounter (Signed)
Pt called/ informed med was refilled with 3 RF's so call [place] - stated he will.

## 2018-02-27 DIAGNOSIS — I10 Essential (primary) hypertension: Secondary | ICD-10-CM | POA: Diagnosis not present

## 2018-02-27 DIAGNOSIS — I5030 Unspecified diastolic (congestive) heart failure: Secondary | ICD-10-CM | POA: Diagnosis not present

## 2018-02-27 DIAGNOSIS — N2581 Secondary hyperparathyroidism of renal origin: Secondary | ICD-10-CM | POA: Diagnosis not present

## 2018-02-27 DIAGNOSIS — N183 Chronic kidney disease, stage 3 (moderate): Secondary | ICD-10-CM | POA: Diagnosis not present

## 2018-02-27 DIAGNOSIS — E1122 Type 2 diabetes mellitus with diabetic chronic kidney disease: Secondary | ICD-10-CM | POA: Diagnosis not present

## 2018-02-27 DIAGNOSIS — I272 Pulmonary hypertension, unspecified: Secondary | ICD-10-CM | POA: Diagnosis not present

## 2018-02-27 DIAGNOSIS — D631 Anemia in chronic kidney disease: Secondary | ICD-10-CM | POA: Diagnosis not present

## 2018-02-27 DIAGNOSIS — M109 Gout, unspecified: Secondary | ICD-10-CM | POA: Diagnosis not present

## 2018-03-03 ENCOUNTER — Other Ambulatory Visit: Payer: Self-pay | Admitting: Internal Medicine

## 2018-03-03 DIAGNOSIS — E1121 Type 2 diabetes mellitus with diabetic nephropathy: Secondary | ICD-10-CM

## 2018-03-03 DIAGNOSIS — Z794 Long term (current) use of insulin: Secondary | ICD-10-CM

## 2018-03-03 MED ORDER — GLIPIZIDE 5 MG PO TABS: 5.0000 mg | ORAL_TABLET | Freq: Two times a day (BID) | ORAL | 3 refills | Status: DC

## 2018-03-03 NOTE — Telephone Encounter (Signed)
Hydralazine #540 with 3 refills sent 06/12/2017. Confirmed with [facility] that they have this rx and will get ready for patient. Will send request for refill on glipizide. Hubbard Hartshorn, RN, BSN

## 2018-03-03 NOTE — Telephone Encounter (Signed)
Need refill glipiZIDE (GLUCOTROL) 5 MG tablet, hydrALAZINE (APRESOLINE) 50 MG tablet @ [facility] [address]; pt contact [phone]

## 2018-04-04 ENCOUNTER — Observation Stay (HOSPITAL_COMMUNITY)
Admission: EM | Admit: 2018-04-04 | Discharge: 2018-04-06 | Disposition: A | Discharge status: Home / Self Care | Payer: Medicare Other | Attending: Internal Medicine | Admitting: Internal Medicine

## 2018-04-04 ENCOUNTER — Emergency Department (HOSPITAL_COMMUNITY): Payer: Medicare Other

## 2018-04-04 ENCOUNTER — Encounter (HOSPITAL_COMMUNITY): Payer: Self-pay | Admitting: Emergency Medicine

## 2018-04-04 ENCOUNTER — Other Ambulatory Visit: Payer: Self-pay

## 2018-04-04 DIAGNOSIS — I5032 Chronic diastolic (congestive) heart failure: Secondary | ICD-10-CM | POA: Diagnosis not present

## 2018-04-04 DIAGNOSIS — Z7984 Long term (current) use of oral hypoglycemic drugs: Secondary | ICD-10-CM | POA: Diagnosis not present

## 2018-04-04 DIAGNOSIS — I5022 Chronic systolic (congestive) heart failure: Secondary | ICD-10-CM | POA: Diagnosis present

## 2018-04-04 DIAGNOSIS — I421 Obstructive hypertrophic cardiomyopathy: Secondary | ICD-10-CM | POA: Diagnosis present

## 2018-04-04 DIAGNOSIS — G4733 Obstructive sleep apnea (adult) (pediatric): Secondary | ICD-10-CM | POA: Diagnosis present

## 2018-04-04 DIAGNOSIS — Z87891 Personal history of nicotine dependence: Secondary | ICD-10-CM | POA: Diagnosis not present

## 2018-04-04 DIAGNOSIS — J449 Chronic obstructive pulmonary disease, unspecified: Secondary | ICD-10-CM | POA: Diagnosis not present

## 2018-04-04 DIAGNOSIS — E1151 Type 2 diabetes mellitus with diabetic peripheral angiopathy without gangrene: Secondary | ICD-10-CM | POA: Insufficient documentation

## 2018-04-04 DIAGNOSIS — Z8719 Personal history of other diseases of the digestive system: Secondary | ICD-10-CM | POA: Insufficient documentation

## 2018-04-04 DIAGNOSIS — I1A0 Resistant hypertension: Secondary | ICD-10-CM | POA: Diagnosis present

## 2018-04-04 DIAGNOSIS — N183 Chronic kidney disease, stage 3 (moderate): Secondary | ICD-10-CM | POA: Diagnosis not present

## 2018-04-04 DIAGNOSIS — I129 Hypertensive chronic kidney disease with stage 1 through stage 4 chronic kidney disease, or unspecified chronic kidney disease: Secondary | ICD-10-CM | POA: Diagnosis present

## 2018-04-04 DIAGNOSIS — E1129 Type 2 diabetes mellitus with other diabetic kidney complication: Secondary | ICD-10-CM | POA: Diagnosis present

## 2018-04-04 DIAGNOSIS — I10 Essential (primary) hypertension: Secondary | ICD-10-CM | POA: Diagnosis present

## 2018-04-04 DIAGNOSIS — I130 Hypertensive heart and chronic kidney disease with heart failure and stage 1 through stage 4 chronic kidney disease, or unspecified chronic kidney disease: Secondary | ICD-10-CM | POA: Diagnosis not present

## 2018-04-04 DIAGNOSIS — Z8249 Family history of ischemic heart disease and other diseases of the circulatory system: Secondary | ICD-10-CM | POA: Diagnosis not present

## 2018-04-04 DIAGNOSIS — Z79899 Other long term (current) drug therapy: Secondary | ICD-10-CM | POA: Diagnosis not present

## 2018-04-04 DIAGNOSIS — I440 Atrioventricular block, first degree: Secondary | ICD-10-CM | POA: Diagnosis not present

## 2018-04-04 DIAGNOSIS — E1122 Type 2 diabetes mellitus with diabetic chronic kidney disease: Secondary | ICD-10-CM | POA: Insufficient documentation

## 2018-04-04 DIAGNOSIS — I2510 Atherosclerotic heart disease of native coronary artery without angina pectoris: Secondary | ICD-10-CM

## 2018-04-04 DIAGNOSIS — I422 Other hypertrophic cardiomyopathy: Secondary | ICD-10-CM | POA: Diagnosis present

## 2018-04-04 DIAGNOSIS — Z7982 Long term (current) use of aspirin: Secondary | ICD-10-CM | POA: Insufficient documentation

## 2018-04-04 DIAGNOSIS — I4581 Long QT syndrome: Secondary | ICD-10-CM | POA: Insufficient documentation

## 2018-04-04 DIAGNOSIS — E1121 Type 2 diabetes mellitus with diabetic nephropathy: Secondary | ICD-10-CM | POA: Diagnosis present

## 2018-04-04 DIAGNOSIS — N179 Acute kidney failure, unspecified: Secondary | ICD-10-CM | POA: Insufficient documentation

## 2018-04-04 DIAGNOSIS — I959 Hypotension, unspecified: Secondary | ICD-10-CM | POA: Diagnosis not present

## 2018-04-04 DIAGNOSIS — E1159 Type 2 diabetes mellitus with other circulatory complications: Secondary | ICD-10-CM | POA: Diagnosis present

## 2018-04-04 DIAGNOSIS — I272 Pulmonary hypertension, unspecified: Secondary | ICD-10-CM | POA: Insufficient documentation

## 2018-04-04 DIAGNOSIS — D631 Anemia in chronic kidney disease: Secondary | ICD-10-CM | POA: Diagnosis not present

## 2018-04-04 DIAGNOSIS — D649 Anemia, unspecified: Secondary | ICD-10-CM | POA: Diagnosis present

## 2018-04-04 DIAGNOSIS — M109 Gout, unspecified: Secondary | ICD-10-CM | POA: Diagnosis present

## 2018-04-04 DIAGNOSIS — E860 Dehydration: Secondary | ICD-10-CM | POA: Diagnosis not present

## 2018-04-04 DIAGNOSIS — Z888 Allergy status to other drugs, medicaments and biological substances status: Secondary | ICD-10-CM | POA: Insufficient documentation

## 2018-04-04 DIAGNOSIS — K5730 Diverticulosis of large intestine without perforation or abscess without bleeding: Secondary | ICD-10-CM | POA: Diagnosis not present

## 2018-04-04 DIAGNOSIS — R0902 Hypoxemia: Secondary | ICD-10-CM | POA: Diagnosis not present

## 2018-04-04 DIAGNOSIS — R42 Dizziness and giddiness: Secondary | ICD-10-CM | POA: Diagnosis not present

## 2018-04-04 HISTORY — DX: Atherosclerotic heart disease of native coronary artery without angina pectoris: I25.10

## 2018-04-04 LAB — BASIC METABOLIC PANEL
Anion gap: 7 (ref 5–15)
BUN: 37 mg/dL — ABNORMAL HIGH (ref 8–23)
CO2: 22 mmol/L (ref 22–32)
Calcium: 8.9 mg/dL (ref 8.9–10.3)
Chloride: 107 mmol/L (ref 98–111)
Creatinine, Ser: 3.05 mg/dL — ABNORMAL HIGH (ref 0.61–1.24)
GFR calc Af Amer: 22 mL/min — ABNORMAL LOW (ref >60)
GFR calc non Af Amer: 19 mL/min — ABNORMAL LOW (ref >60)
Glucose, Bld: 154 mg/dL — ABNORMAL HIGH (ref 70–99)
Potassium: 4.7 mmol/L (ref 3.5–5.1)
Sodium: 136 mmol/L (ref 135–145)

## 2018-04-04 LAB — I-STAT TROPONIN, ED: Troponin i, poc: 0 ng/mL (ref 0.00–0.08)

## 2018-04-04 LAB — MAGNESIUM: Magnesium: 2.2 mg/dL (ref 1.7–2.4)

## 2018-04-04 LAB — CBC
HCT: 41.9 % (ref 39.0–52.0)
Hemoglobin: 13.7 g/dL (ref 13.0–17.0)
MCH: 32.2 pg (ref 26.0–34.0)
MCHC: 32.7 g/dL (ref 30.0–36.0)
MCV: 98.6 fL (ref 78.0–100.0)
Platelets: 159 10*3/uL (ref 150–400)
RBC: 4.25 MIL/uL (ref 4.22–5.81)
RDW: 12.4 % (ref 11.5–15.5)
WBC: 9.7 10*3/uL (ref 4.0–10.5)

## 2018-04-04 LAB — I-STAT CG4 LACTIC ACID, ED: Lactic Acid, Venous: 1.25 mmol/L (ref 0.5–1.9)

## 2018-04-04 MED ORDER — POLYETHYLENE GLYCOL 3350 17 G PO PACK
17.0000 g | PACK | Freq: Every day | ORAL | Status: DC
  Administered 2018-04-05 – 2018-04-06 (×2): 17 g via ORAL
  Filled 2018-04-04 (×2): qty 1

## 2018-04-04 MED ORDER — PRAVASTATIN SODIUM 40 MG PO TABS
40.0000 mg | ORAL_TABLET | Freq: Every evening | ORAL | Status: DC
  Administered 2018-04-05: 40 mg via ORAL
  Filled 2018-04-04: qty 1

## 2018-04-04 MED ORDER — LACTATED RINGERS IV BOLUS
1000.0000 mL | Freq: Once | INTRAVENOUS | Status: AC
  Administered 2018-04-04: 1000 mL via INTRAVENOUS

## 2018-04-04 MED ORDER — ALBUTEROL SULFATE HFA 108 (90 BASE) MCG/ACT IN AERS
1.0000 | INHALATION_SPRAY | Freq: Four times a day (QID) | RESPIRATORY_TRACT | Status: DC | PRN
  Filled 2018-04-04: qty 6.7

## 2018-04-04 MED ORDER — GLIPIZIDE 5 MG PO TABS
5.0000 mg | ORAL_TABLET | Freq: Two times a day (BID) | ORAL | Status: DC
  Administered 2018-04-05 – 2018-04-06 (×3): 5 mg via ORAL
  Filled 2018-04-04 (×3): qty 1

## 2018-04-04 MED ORDER — ALBUTEROL SULFATE (2.5 MG/3ML) 0.083% IN NEBU: 2.5000 mg | INHALATION_SOLUTION | Freq: Four times a day (QID) | RESPIRATORY_TRACT | Status: DC | PRN

## 2018-04-04 MED ORDER — ONDANSETRON HCL 4 MG/2ML IJ SOLN: 4.0000 mg | Freq: Four times a day (QID) | INTRAMUSCULAR | Status: DC | PRN

## 2018-04-04 MED ORDER — SODIUM CHLORIDE 0.9 % IV SOLN
INTRAVENOUS | Status: AC
  Administered 2018-04-05 (×2): via INTRAVENOUS

## 2018-04-04 MED ORDER — CALCITRIOL 0.25 MCG PO CAPS
0.2500 ug | ORAL_CAPSULE | Freq: Every day | ORAL | Status: DC
  Administered 2018-04-05: 0.25 ug via ORAL
  Filled 2018-04-04 (×2): qty 1

## 2018-04-04 MED ORDER — HEPARIN SODIUM (PORCINE) 5000 UNIT/ML IJ SOLN
5000.0000 [IU] | Freq: Three times a day (TID) | INTRAMUSCULAR | Status: DC
  Administered 2018-04-05 – 2018-04-06 (×5): 5000 [IU] via SUBCUTANEOUS
  Filled 2018-04-04 (×5): qty 1

## 2018-04-04 MED ORDER — ACETAMINOPHEN 650 MG RE SUPP: 650.0000 mg | Freq: Four times a day (QID) | RECTAL | Status: DC | PRN

## 2018-04-04 MED ORDER — ACETAMINOPHEN 325 MG PO TABS: 650.0000 mg | ORAL_TABLET | Freq: Four times a day (QID) | ORAL | Status: DC | PRN

## 2018-04-04 MED ORDER — ALLOPURINOL 100 MG PO TABS
100.0000 mg | ORAL_TABLET | Freq: Every day | ORAL | Status: DC
  Administered 2018-04-05 – 2018-04-06 (×3): 100 mg via ORAL
  Filled 2018-04-04 (×3): qty 1

## 2018-04-04 MED ORDER — KETOTIFEN FUMARATE 0.025 % OP SOLN
1.0000 [drp] | Freq: Two times a day (BID) | OPHTHALMIC | Status: DC
  Administered 2018-04-05 – 2018-04-06 (×4): 1 [drp] via OPHTHALMIC
  Filled 2018-04-04: qty 5

## 2018-04-04 MED ORDER — ASPIRIN 81 MG PO CHEW
81.0000 mg | CHEWABLE_TABLET | Freq: Every day | ORAL | Status: DC
  Administered 2018-04-05 – 2018-04-06 (×3): 81 mg via ORAL
  Filled 2018-04-04 (×3): qty 1

## 2018-04-04 MED ORDER — ONDANSETRON HCL 4 MG PO TABS: 4.0000 mg | ORAL_TABLET | Freq: Four times a day (QID) | ORAL | Status: DC | PRN

## 2018-04-04 NOTE — ED Provider Notes (Signed)
[facility] EMERGENCY DEPARTMENT Provider Note   CSN: [id number] Arrival date & time: 04/04/18  1438     History   Chief Complaint Chief Complaint  Patient presents with  . Hypotension    HPI Dylan [name] is a 72 y.o. male.  HPI   72 year old male PMH significant for HFpEF last known EF 60-65%, CKD stage III, P HTN, anemia, diverticulosis who presents with chief complaint of lightheadedness.  Patient states that 41/2 hours prior to arrival he had acute onset of lightheadedness without room spinning, severe, onset while cleaning his oven without chemicals.  Patient called his son who brought him to the emergency department for symptoms.  Patient denies headache, changes in vision, focal numbness or tingling or weakness, or chest pain.  Patient states blood sugars were 140s at home.  Patient took blood pressure just prior to arrival while he was in the 25E systolic.  Patient is that this is happened to him twice before which resulted in diagnosis of dehydration which when he received fluids and was sent home.  Patient states that he was up 9 times over the night with urination, denies dysuria or fever.  Past Medical History:  Diagnosis Date  . Anemia 10/18/2014   Baseline about 12 and stable from 2010 to 2016. Colon 2009 in [place] (records cannot be obtained).  EGD Dr Benson [place] 2011 nl   . Chronic diastolic heart failure ([place]) 10/18/2014   Noted ECHO 10/2014. Grade 2. EF 50-55%   . Chronic renal failure, stage 3 (moderate) ([name]) 10/18/2014   Baseline Cr about 1.5. Stable from 2010 to 2016.   Marland Kitchen Coronary artery disease   . Diverticulosis 10/08/2014   Seen on CT. Reportedly on Colon in [place] in 2009. Freq bouts of diverticulitis.  . DM (diabetes mellitus), type 2 with renal complications ([place]) 11/06/7780  . Essential hypertension 10/09/2014   Poor control with 5 drug therapy.   . Former tobacco use 10/08/2014  . Gout   . OSA on CPAP 10/08/2014  . Pulmonary HTN ([place]) 10/18/2014   Noted as  severe ECHO 2016. Likely 2/2 severe OSA.    Patient Active Problem List   Diagnosis Date Noted  . AKI (acute kidney injury) ([place]) 04/04/2018  . Coronary artery disease 04/04/2018  . Chronic pancreatitis ([place]) 11/10/2017  . Thrombocytopenia ([place]) 10/09/2016  . Allergic rhinitis 06/15/2015  . 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 Atherosclerosis 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 ([place]) 12/28/2014  . Healthcare maintenance 12/12/2014  . Gout 10/19/2014  . Stage 3 chronic renal impairment associated with type 2 diabetes mellitus ([place]) 10/18/2014  . Anemia 10/18/2014  . Obesity 10/18/2014  . Pulmonary HTN ([place]) 10/18/2014  . Chronic diastolic heart failure ([place]) 10/18/2014  . Hypertension associated with diabetes ([place]) 10/09/2014  . OSA treated with BiPAP 10/08/2014  . Former tobacco use 10/08/2014  . Diverticulosis 10/08/2014  . DM (diabetes mellitus), type 2 with renal complications ([place]) 42/35/3614    Past Surgical History:  Procedure Laterality Date  . CHOLECYSTECTOMY          Home Medications    Prior to Admission medications   Medication Sig Start Date End Date Taking? Authorizing Provider  albuterol (PROVENTIL HFA;VENTOLIN HFA) 108 (90 Base) MCG/ACT inhaler Inhale 1-2 puffs into the lungs every 6 (six) hours as needed for wheezing or shortness of breath. 10/30/17  Yes Bartholomew Crews, MD  allopurinol (ZYLOPRIM) 100 MG tablet Take 1 tablet (100 mg total) by mouth daily.  06/12/17 06/12/18 Yes Bartholomew Crews, MD  aspirin 81 MG chewable tablet Chew 1 tablet (81 mg total) by mouth daily. 06/15/15  Yes Riccardo Dubin, MD  calcitRIOL (ROCALTROL) 0.25 MCG capsule Take 0.25 mcg by mouth daily. 03/05/18  Yes [provider]  eplerenone (INSPRA) 50 MG tablet Take 1 tablet (50 mg total) by mouth 2 (two) times daily. 09/11/17  Yes Bartholomew Crews, MD  glipiZIDE (GLUCOTROL) 5 MG tablet Take 1 tablet (5 mg total) by mouth 2 (two) times daily  before a meal. 03/03/18  Yes Bartholomew Crews, MD  hydrALAZINE (APRESOLINE) 50 MG tablet Take 2 tablets (100 mg total) by mouth 3 (three) times daily. 06/12/17  Yes Bartholomew Crews, MD  ketotifen (ZADITOR) 0.025 % ophthalmic solution Place 1 drop into both eyes 2 (two) times daily.   Yes [provider]  labetalol (NORMODYNE) 200 MG tablet Take 2 tablets (400 mg total) by mouth 2 (two) times daily. 10/30/17  Yes Bartholomew Crews, MD  minoxidil (LONITEN) 10 MG tablet Take 2 tablets (20 mg total) by mouth daily. 10/30/17  Yes Bartholomew Crews, MD  polyethylene glycol [facility] / GLYCOLAX) packet Take 17 g by mouth daily.   Yes [provider]  Potassium Chloride ER 20 MEQ TBCR Take 20 mEq by mouth daily. 12/25/17  Yes Bartholomew Crews, MD  pravastatin (PRAVACHOL) 40 MG tablet Take 1 tablet (40 mg total) by mouth every evening. Patient taking differently: Take 40 mg by mouth every evening. cholesterol 10/30/17 10/30/18 Yes Bartholomew Crews, MD  torsemide [facility]) 10 MG tablet Take 2 tablets in morning and 1 tablet in the evening 10/30/17  Yes Forde Dandy, PharmD  verapamil (VERELAN PM) 360 MG 24 hr capsule Take 1 capsule (360 mg total) by mouth daily. 12/25/17  Yes Bartholomew Crews, MD  diclofenac sodium (VOLTAREN) 1 % GEL Apply 4 g topically 4 (four) times daily. Apply to left knee Patient not taking: Reported on 04/04/2018 12/25/17   Bartholomew Crews, MD  docusate sodium (COLACE) 100 MG capsule Take 1 capsule (100 mg total) by mouth every 12 (twelve) hours. Patient not taking: Reported on 04/04/2018 08/05/15   Charlesetta Shanks, MD  FREESTYLE LITE test strip USE 1 STRIP TO CHECK GLUCOSE ONCE DAILY WITH MEALS AND AT BEDTIME 02/19/18   Bartholomew Crews, MD  loratadine (CLARITIN) 10 MG tablet Take 1 tablet (10 mg total) by mouth daily. Patient not taking: Reported on 04/04/2018 06/12/17   Bartholomew Crews, MD  triamcinolone (NASACORT AQ) 55 MCG/ACT AERO  nasal inhaler Place 2 sprays into the nose daily. Patient not taking: Reported on 04/04/2018 06/03/17 06/03/18  Maryellen Pile, MD    Family History Family History  Problem Relation Age of Onset  . Heart failure Sister        Died of complications Nov 2244  . CAD Sister        CABG x3 while in her 74's    Social History Social History   Tobacco Use  . Smoking status: Former Smoker    Last attempt to quit: 10/02/1972    Years since quitting: 45.5  . Smokeless tobacco: Never Used  Substance Use Topics  . Alcohol use: No    Alcohol/week: 0.0 standard drinks  . Drug use: No     Allergies   Ace inhibitors and Spironolactone   Review of Systems Review of Systems  Constitutional: Negative for chills and fever.  HENT: Negative for ear  pain and sore throat.   Eyes: Negative for pain and visual disturbance.  Respiratory: Negative for cough and shortness of breath.   Cardiovascular: Negative for chest pain and palpitations.  Gastrointestinal: Positive for abdominal pain. Negative for abdominal distention, constipation, diarrhea and vomiting.  Genitourinary: Positive for frequency. Negative for decreased urine volume, difficulty urinating, dysuria and hematuria.  Musculoskeletal: Negative for arthralgias and back pain.  Skin: Negative for color change and rash.  Neurological: Positive for light-headedness. Negative for dizziness, seizures, syncope, facial asymmetry, weakness, numbness and headaches.  All other systems reviewed and are negative.    Physical Exam Updated Vital Signs BP (!) 121/51   Pulse 71   Temp 98.6 F (37 C) (Oral)   Resp 17   Ht _0  (1.676 m)   Wt 98.6 kg   SpO2 97%   BMI 35.09 kg/m   Physical Exam  Constitutional: He appears well-developed and well-nourished.  HENT:  Head: Normocephalic and atraumatic.  Mouth/Throat: Mucous membranes are dry.  Eyes: Conjunctivae are normal.  Neck: Neck supple.  Cardiovascular: Normal rate and regular  rhythm.  No murmur heard. Pulmonary/Chest: Effort normal and breath sounds normal. No respiratory distress.  Abdominal: Soft. There is no tenderness.  Musculoskeletal: He exhibits no edema.  Neurological: He is alert.  Skin: Skin is warm and dry. Capillary refill takes less than 2 seconds.  Psychiatric: He has a normal mood and affect.  Nursing note and vitals reviewed.    ED Treatments / Results  Labs (all labs ordered are listed, but only abnormal results are displayed) Labs Reviewed  BASIC METABOLIC PANEL - Abnormal; Notable for the following components:      Result Value   Glucose, Bld 154 (*)    BUN 37 (*)    Creatinine, Ser 3.05 (*)    GFR calc non Af Amer 19 (*)    GFR calc Af Amer 22 (*)    All other components within normal limits  CBC  MAGNESIUM  URINALYSIS, DIPSTICK ONLY  TROPONIN I  CBC WITH DIFFERENTIAL/PLATELET  COMPREHENSIVE METABOLIC PANEL  CBC  I-STAT CG4 LACTIC ACID, ED  I-STAT TROPONIN, ED  I-STAT CG4 LACTIC ACID, ED    EKG None  Radiology Ct Abdomen Pelvis Wo Contrast  Result Date: 04/04/2018 CLINICAL DATA:  Flank pain. EXAM: CT ABDOMEN AND PELVIS WITHOUT CONTRAST TECHNIQUE: Multidetector CT imaging of the abdomen and pelvis was performed following the standard protocol without IV contrast. COMPARISON:  CT scan of July 07, 2009. FINDINGS: Lower chest: No acute abnormality. Hepatobiliary: Status post cholecystectomy. No biliary dilatation is noted. New 14 mm low density is noted in left hepatic lobe superiorly most consistent with cyst. Pancreas: Unremarkable. No pancreatic ductal dilatation or surrounding inflammatory changes. Spleen: Normal in size without focal abnormality. Adrenals/Urinary Tract: Adrenal glands are unremarkable. Kidneys are normal, without renal calculi, focal lesion, or hydronephrosis. Bladder is unremarkable. Stomach/Bowel: Stomach is within normal limits. Appendix appears normal. No evidence of bowel wall thickening, distention,  or inflammatory changes. Sigmoid diverticulosis is noted without inflammation. Vascular/Lymphatic: Aortic atherosclerosis. No enlarged abdominal or pelvic lymph nodes. Reproductive: Stable mild prostatic enlargement is noted. Other: Small periumbilical hernia is noted which contains a loop of small bowel, but does not result in incarceration or obstruction. Musculoskeletal: No acute or significant osseous findings. IMPRESSION: Sigmoid diverticulosis without inflammation. Stable mild prostatic enlargement. Small periumbilical hernia containing loop of small bowel, but not resulting in incarceration or obstruction. Aortic Atherosclerosis (ICD10-I70.0). Electronically Signed   By: Jeneen Rinks  Murlean Caller, M.D.   On: 04/04/2018 18:37   Dg Chest 2 View  Result Date: 04/04/2018 CLINICAL DATA:  Hypotension, hypoxia EXAM: CHEST - 2 VIEW COMPARISON:  09/04/2015 chest radiograph. FINDINGS: Stable cardiomediastinal silhouette with mild cardiomegaly. No pneumothorax. No pleural effusion. Lungs appear clear, with no acute consolidative airspace disease and no pulmonary edema. IMPRESSION: Stable mild cardiomegaly without pulmonary edema. No active pulmonary disease. Electronically Signed   By: Ilona Sorrel M.D.   On: 04/04/2018 19:25    Procedures Procedures (including critical care time)  Medications Ordered in ED Medications  pravastatin (PRAVACHOL) tablet 40 mg (has no administration in time range)  polyethylene glycol (MIRALAX / GLYCOLAX) packet 17 g (has no administration in time range)  ketotifen (ZADITOR) 0.025 % ophthalmic solution 1 drop (has no administration in time range)  glipiZIDE (GLUCOTROL) tablet 5 mg (has no administration in time range)  calcitRIOL (ROCALTROL) capsule 0.25 mcg (has no administration in time range)  aspirin chewable tablet 81 mg (has no administration in time range)  allopurinol (ZYLOPRIM) tablet 100 mg (has no administration in time range)  heparin injection 5,000 Units (has no  administration in time range)  0.9 %  sodium chloride infusion (has no administration in time range)  ondansetron (ZOFRAN) tablet 4 mg (has no administration in time range)    Or  ondansetron (ZOFRAN) injection 4 mg (has no administration in time range)  acetaminophen (TYLENOL) tablet 650 mg (has no administration in time range)    Or  acetaminophen (TYLENOL) suppository 650 mg (has no administration in time range)  albuterol (PROVENTIL) (2.5 MG/3ML) 0.083% nebulizer solution 2.5 mg (has no administration in time range)  lactated ringers bolus 1,000 mL (0 mLs Intravenous Stopped 04/04/18 2140)     Initial Impression / Assessment and Plan / ED Course  I have reviewed the triage vital signs and the nursing notes.  Pertinent labs & imaging results that were available during my care of the patient were reviewed by me and considered in my medical decision making (see chart for details).     72 year old male permission for HFpEF last known EF 60-65%, CKD stage III, P HTN, anemia, diverticulosis who presents with chief complaint of lightheadedness. History as above. DDx includes dehydration, infection, or arrhythmia  Patient maps 60-65 while in the room.  Heart rate in the 50s.  Patient noted to be taken beta-blockers and thus unable to have reflexive tachycardia to possible pathology.  Patient given IV fluids, labs performed revealing AKI of creatinine 3, baseline 1.75.  CT abdomen pelvis negative for signs of diverticulitis.  EKG shows first-degree block with mildly prolonged QT interval.  Patient did have improvement of maps and systolics in the 1 teens post fluids.  Patient admitted to hospitalist for management of AKI.  Final Clinical Impressions(s) / ED Diag noses   Final diagnoses:  None    ED Discharge Orders    None       Keenan Bachelor, MD 04/05/18 [id number]    Elnora Morrison, MD 04/05/18 [phone]

## 2018-04-04 NOTE — ED Triage Notes (Signed)
Pt reports he was cleaning his oven around 11 this am when he began feeling dizzy, pt checked his bp and it was 76/50. pts bp 78/48 in triage. Pt a/ox4, resp e/u, nad.

## 2018-04-04 NOTE — H&P (Signed)
History and Physical    SHAHEER BONFIELD MAU:[id number] DOB: 1946-05-16 DOA: 04/04/2018  PCP: Bartholomew Crews, MD   Patient coming from: Home.  I have personally briefly reviewed patient's old medical records in [place]  Chief Complaint: Dizziness.  HPI: Dylan [name] is a 72 y.o. male with medical history significant of anemia, chronic diastolic heart failure, stage III chronic renal insufficiency, CAD, diverticulosis, type 2 diabetes, essential hypertension, COPD (quit smoking 30 years ago), gout, OSA not using CPAP, severe pulmonary hypertension per echo who is coming to the emergency department with complaints of dizziness when he was cleaning his oven at home.  He checked his blood pressure at home and he was 76/50 mmHg.  So he decided to come to the emergency department.  He denies fever, chills, sore throat, dyspnea, wheezing, hemoptysis, chest pain, palpitations, diaphoresis, PND, orthopnea or pitting edema of the lower extremities.  He complains of mild abdominal pain, but denies nausea, emesis, diarrhea, constipation, melena or hematochezia.  No dysuria or frequency.  Denies polyuria, polydipsia or polyphagia.  Denies pruritus.  Denies heat or cold intolerance.  ED Course: Initial vital signs temperature 97.8 degrees Fahrenheit, pulse 74, respirations 16 and blood pressure 78/48 mmHg. he received at thousand mL of LR in the ED.  His CBC was normal.  Electrolytes lactic acid were normal.  His BUN was 37, creatinine 3.05 and glucose 154 mg/dL.  EKG showed no changes.  Review of Systems: As per HPI otherwise 10 point review of systems negative.   Past Medical History:  Diagnosis Date  . Anemia 10/18/2014   Baseline about 12 and stable from 2010 to 2016. Colon 2009 in [place] (records cannot be obtained).  EGD Dr Benson [place] 2011 nl   . Chronic diastolic heart failure ([place]) 10/18/2014   Noted ECHO 10/2014. Grade 2. EF 50-55%   . Chronic renal failure, stage 3 (moderate) ([name]) 10/18/2014   Baseline Cr about 1.5. Stable from 2010 to 2016.   Marland Kitchen Coronary artery disease   . Diverticulosis 10/08/2014   Seen on CT. Reportedly on Colon in [place] in 2009. Freq bouts of diverticulitis.  . DM (diabetes mellitus), type 2 with renal complications ([place]) 11/11/3891  . Essential hypertension 10/09/2014   Poor control with 5 drug therapy.   . Former tobacco use 10/08/2014  . Gout   . OSA on CPAP 10/08/2014  . Pulmonary HTN ([place]) 10/18/2014   Noted as severe ECHO 2016. Likely 2/2 severe OSA.    Past Surgical History:  Procedure Laterality Date  . CHOLECYSTECTOMY       reports that he quit smoking about 45 years ago. He has never used smokeless tobacco. He reports that he does not drink alcohol or use drugs.  Allergies  Allergen Reactions  . Ace Inhibitors Other (See Comments)    ARF - see CRF overview  . Spironolactone Other (See Comments)    Gynecomastia per pt report.    Family History  Problem Relation Age of Onset  . Heart failure Sister        Died of complications Nov 7342  . CAD Sister        CABG x3 while in her 39's    Prior to Admission medications   Medication Sig Start Date End Date Taking? Authorizing Provider  albuterol (PROVENTIL HFA;VENTOLIN HFA) 108 (90 Base) MCG/ACT inhaler Inhale 1-2 puffs into the lungs every 6 (six) hours as needed for wheezing or shortness of breath. 10/30/17  Yes Larey Dresser  A, MD  allopurinol (ZYLOPRIM) 100 MG tablet Take 1 tablet (100 mg total) by mouth daily. 06/12/17 06/12/18 Yes Bartholomew Crews, MD  aspirin 81 MG chewable tablet Chew 1 tablet (81 mg total) by mouth daily. 06/15/15  Yes Riccardo Dubin, MD  calcitRIOL (ROCALTROL) 0.25 MCG capsule Take 0.25 mcg by mouth daily. 03/05/18  Yes [provider]  eplerenone (INSPRA) 50 MG tablet Take 1 tablet (50 mg total) by mouth 2 (two) times daily. 09/11/17  Yes Bartholomew Crews, MD  glipiZIDE (GLUCOTROL) 5 MG tablet Take 1 tablet (5 mg total) by mouth 2 (two) times daily before a  meal. 03/03/18  Yes Bartholomew Crews, MD  hydrALAZINE (APRESOLINE) 50 MG tablet Take 2 tablets (100 mg total) by mouth 3 (three) times daily. 06/12/17  Yes Bartholomew Crews, MD  ketotifen (ZADITOR) 0.025 % ophthalmic solution Place 1 drop into both eyes 2 (two) times daily.   Yes [provider]  labetalol (NORMODYNE) 200 MG tablet Take 2 tablets (400 mg total) by mouth 2 (two) times daily. 10/30/17  Yes Bartholomew Crews, MD  minoxidil (LONITEN) 10 MG tablet Take 2 tablets (20 mg total) by mouth daily. 10/30/17  Yes Bartholomew Crews, MD  polyethylene glycol [facility] / GLYCOLAX) packet Take 17 g by mouth daily.   Yes [provider]  Potassium Chloride ER 20 MEQ TBCR Take 20 mEq by mouth daily. 12/25/17  Yes Bartholomew Crews, MD  pravastatin (PRAVACHOL) 40 MG tablet Take 1 tablet (40 mg total) by mouth every evening. Patient taking differently: Take 40 mg by mouth every evening. cholesterol 10/30/17 10/30/18 Yes Bartholomew Crews, MD  torsemide [facility]) 10 MG tablet Take 2 tablets in morning and 1 tablet in the evening 10/30/17  Yes Forde Dandy, PharmD  verapamil (VERELAN PM) 360 MG 24 hr capsule Take 1 capsule (360 mg total) by mouth daily. 12/25/17  Yes Bartholomew Crews, MD  diclofenac sodium (VOLTAREN) 1 % GEL Apply 4 g topically 4 (four) times daily. Apply to left knee Patient not taking: Reported on 04/04/2018 12/25/17   Bartholomew Crews, MD  docusate sodium (COLACE) 100 MG capsule Take 1 capsule (100 mg total) by mouth every 12 (twelve) hours. Patient not taking: Reported on 04/04/2018 08/05/15   Charlesetta Shanks, MD  FREESTYLE LITE test strip USE 1 STRIP TO CHECK GLUCOSE ONCE DAILY WITH MEALS AND AT BEDTIME 02/19/18   Bartholomew Crews, MD  loratadine (CLARITIN) 10 MG tablet Take 1 tablet (10 mg total) by mouth daily. Patient not taking: Reported on 04/04/2018 06/12/17   Bartholomew Crews, MD  triamcinolone (NASACORT AQ) 55 MCG/ACT AERO nasal inhaler  Place 2 sprays into the nose daily. Patient not taking: Reported on 04/04/2018 06/03/17 06/03/18  Maryellen Pile, MD    Physical Exam: Vitals:   04/04/18 2030 04/04/18 2045 04/04/18 2100 04/04/18 2115  BP: (!) 145/65 (!) 149/65 (!) 145/62 (!) 126/59  Pulse: 69 65 66 68  Resp: _0 Temp:      TempSrc:      SpO2: 95% 96% 96% 94%    Constitutional: NAD, calm, comfortable Eyes: PERRL, lids and conjunctivae normal ENMT: Mucous membranes are dry. Posterior pharynx clear of any exudate or lesions. Neck: normal, supple, no masses, no thyromegaly Respiratory: clear to auscultation bilaterally, no wheezing, no crackles. Normal respiratory effort. No accessory muscle use.  Cardiovascular: Regular rate and rhythm, no murmurs / rubs / gallops. No extremity edema.  2+ pedal pulses. No carotid bruits.  Abdomen: Soft, suprapubic and LLQ tenderness, no guarding/rebound/masses palpated. No hepatosplenomegaly. Bowel sounds positive.  Musculoskeletal: no clubbing / cyanosis. Good ROM, no contractures. Normal muscle tone.  Skin: Decreased skin turgor, but no rashes, lesions, ulcers. Neurologic: CN 2-12 grossly intact. Sensation intact, DTR normal. Strength 5/5 in all 4.  Psychiatric: Normal judgment and insight. Alert and oriented x 3. Normal mood.   Labs on Admission: I have personally reviewed following labs and imaging studies  CBC: Recent Labs  Lab 04/04/18 1723  WBC 9.7  HGB 13.7  HCT 41.9  MCV 98.6  PLT 161   Basic Metabolic Panel: Recent Labs  Lab 04/04/18 1723  NA 136  K 4.7  CL 107  CO2 22  GLUCOSE 154*  BUN 37*  CREATININE 3.05*  CALCIUM 8.9  MG 2.2   GFR: CrCl cannot be calculated (Unknown ideal weight.). Liver Function Tests: No results for input(s): AST, ALT, ALKPHOS, BILITOT, PROT, ALBUMIN in the last 168 hours. No results for input(s): LIPASE, AMYLASE in the last 168 hours. No results for input(s): AMMONIA in the last 168 hours. Coagulation Profile: No  results for input(s): INR, PROTIME in the last 168 hours. Cardiac Enzymes: No results for input(s): CKTOTAL, CKMB, CKMBINDEX, TROPONINI in the last 168 hours. BNP (last 3 results) No results for input(s): PROBNP in the last 8760 hours. HbA1C: No results for input(s): HGBA1C in the last 72 hours. CBG: No results for input(s): GLUCAP in the last 168 hours. Lipid Profile: No results for input(s): CHOL, HDL, LDLCALC, TRIG, CHOLHDL, LDLDIRECT in the last 72 hours. Thyroid Function Tests: No results for input(s): TSH, T4TOTAL, FREET4, T3FREE, THYROIDAB in the last 72 hours. Anemia Panel: No results for input(s): VITAMINB12, FOLATE, FERRITIN, TIBC, IRON, RETICCTPCT in the last 72 hours. Urine analysis:    Component Value Date/Time   COLORURINE AMBER (A) 03/21/2017 2145   APPEARANCEUR HAZY (A) 03/21/2017 2145   LABSPEC 1.017 03/21/2017 2145   PHURINE 5.0 03/21/2017 2145   GLUCOSEU NEGATIVE 03/21/2017 2145   HGBUR NEGATIVE 03/21/2017 2145   BILIRUBINUR NEGATIVE 03/21/2017 2145   [place] NEGATIVE 03/21/2017 2145   PROTEINUR 100 (A) 03/21/2017 2145   UROBILINOGEN 0.2 10/11/2014 0845   NITRITE NEGATIVE 03/21/2017 2145   LEUKOCYTESUR NEGATIVE 03/21/2017 2145    Radiological Exams on Admission: Ct Abdomen Pelvis Wo Contrast  Result Date: 04/04/2018 CLINICAL DATA:  Flank pain. EXAM: CT ABDOMEN AND PELVIS WITHOUT CONTRAST TECHNIQUE: Multidetector CT imaging of the abdomen and pelvis was performed following the standard protocol without IV contrast. COMPARISON:  CT scan of July 07, 2009. FINDINGS: Lower chest: No acute abnormality. Hepatobiliary: Status post cholecystectomy. No biliary dilatation is noted. New 14 mm low density is noted in left hepatic lobe superiorly most consistent with cyst. Pancreas: Unremarkable. No pancreatic ductal dilatation or surrounding inflammatory changes. Spleen: Normal in size without focal abnormality. Adrenals/Urinary Tract: Adrenal glands are unremarkable.  Kidneys are normal, without renal calculi, focal lesion, or hydronephrosis. Bladder is unremarkable. Stomach/Bowel: Stomach is within normal limits. Appendix appears normal. No evidence of bowel wall thickening, distention, or inflammatory changes. Sigmoid diverticulosis is noted without inflammation. Vascular/Lymphatic: Aortic atherosclerosis. No enlarged abdominal or pelvic lymph nodes. Reproductive: Stable mild prostatic enlargement is noted. Other: Small periumbilical hernia is noted which contains a loop of small bowel, but does not result in incarceration or obstruction. Musculoskeletal: No acute or significant osseous findings. IMPRESSION: Sigmoid diverticulosis without inflammation. Stable mild prostatic enlargement. Small periumbilical hernia containing loop  of small bowel, but not resulting in incarceration or obstruction. Aortic Atherosclerosis (ICD10-I70.0). Electronically Signed   By: Marijo Conception, M.D.   On: 04/04/2018 18:37   Dg Chest 2 View  Result Date: 04/04/2018 CLINICAL DATA:  Hypotension, hypoxia EXAM: CHEST - 2 VIEW COMPARISON:  09/04/2015 chest radiograph. FINDINGS: Stable cardiomediastinal silhouette with mild cardiomegaly. No pneumothorax. No pleural effusion. Lungs appear clear, with no acute consolidative airspace disease and no pulmonary edema. IMPRESSION: Stable mild cardiomegaly without pulmonary edema. No active pulmonary disease. Electronically Signed   By: Ilona Sorrel M.D.   On: 04/04/2018 19:25    EKG: Independently reviewed. Vent. rate 59 BPM PR interval * ms QRS duration 98 ms QT/QTc 504/500 ms P-R-T axes 28 29 74 Sinus rhythm Prolonged PR interval Low voltage, extremity leads Nonspecific T abnormalities, lateral leads Borderline prolonged QT interval No significant change when compared to previous  Assessment/Plan Principal Problem:   AKI (acute kidney injury) ([place])   Stage 3 chronic renal impairment associated with type 2 diabetes mellitus  ([name]) Baseline creatinine is usually less than 2. Observation/telemetry. Continue IV fluids. Monitor intake and output. Hold lisinopril and torsemide. Follow-up renal function electrolytes in a.m. Kidneys and urinary tract look normal on CT.  Active Problems:   OSA treated with BiPAP Per patient, he has not been using BiPAP or CPAP for some time now.    DM (diabetes mellitus), type 2 with renal complications ([name]) Carbohydrate modified diet. Continue glipizide 5 mg p.o. twice daily. CBG monitoring before meals and bedtime while in the hospital.    Hypertension associated with diabetes ([place]) Hold all antihypertensive medications at this time. Monitor blood pressure. Resume beta-blocker or vasodilator if BP trends up. Hold ACE inhibitor and diuretic due to AKI.    Anemia CBC is normal due to hemoconcentration. Monitor hematocrit and hemoglobin.    Chronic diastolic heart failure ([name]) Hold diuretic, ACE inhibitor and beta-blocker.    Gout Continue allopurinol 100 mg p.o. daily.    Coronary artery disease Continue aspirin. Continue pravastatin. Hold beta-blocker.    DVT prophylaxis: Lovenox SQ. Code Status: Full code. Family Communication:  Disposition Plan: Observation for IV hydration. Consults called:  Admission status: Observation/telemetry.   Reubin Milan MD [facility] Pager [phone].  If 7PM-7AM, please contact night-coverage [email or web address] Password [facility]  04/04/2018, 9:57 PM

## 2018-04-04 NOTE — ED Notes (Signed)
Patient transported to X-ray 

## 2018-04-05 ENCOUNTER — Other Ambulatory Visit: Payer: Self-pay

## 2018-04-05 DIAGNOSIS — I959 Hypotension, unspecified: Secondary | ICD-10-CM | POA: Diagnosis not present

## 2018-04-05 LAB — CBC WITH DIFFERENTIAL/PLATELET
Abs Immature Granulocytes: 0 10*3/uL (ref 0.0–0.1)
Basophils Absolute: 0.1 10*3/uL (ref 0.0–0.1)
Basophils Relative: 1 %
Eosinophils Absolute: 0.3 10*3/uL (ref 0.0–0.7)
Eosinophils Relative: 3 %
HCT: 39.6 % (ref 39.0–52.0)
Hemoglobin: 13 g/dL (ref 13.0–17.0)
Immature Granulocytes: 0 %
Lymphocytes Relative: 23 %
Lymphs Abs: 1.8 10*3/uL (ref 0.7–4.0)
MCH: 32 pg (ref 26.0–34.0)
MCHC: 32.8 g/dL (ref 30.0–36.0)
MCV: 97.5 fL (ref 78.0–100.0)
Monocytes Absolute: 1.3 10*3/uL — ABNORMAL HIGH (ref 0.1–1.0)
Monocytes Relative: 16 %
Neutro Abs: 4.6 10*3/uL (ref 1.7–7.7)
Neutrophils Relative %: 57 %
Platelets: 146 10*3/uL — ABNORMAL LOW (ref 150–400)
RBC: 4.06 MIL/uL — ABNORMAL LOW (ref 4.22–5.81)
RDW: 12.3 % (ref 11.5–15.5)
WBC: 8.2 10*3/uL (ref 4.0–10.5)

## 2018-04-05 LAB — GLUCOSE, CAPILLARY: Glucose-Capillary: 202 mg/dL — ABNORMAL HIGH (ref 70–99)

## 2018-04-05 LAB — COMPREHENSIVE METABOLIC PANEL
ALT: 20 U/L (ref 0–44)
AST: 22 U/L (ref 15–41)
Albumin: 3.7 g/dL (ref 3.5–5.0)
Alkaline Phosphatase: 38 U/L (ref 38–126)
Anion gap: 9 (ref 5–15)
BUN: 39 mg/dL — ABNORMAL HIGH (ref 8–23)
CO2: 18 mmol/L — ABNORMAL LOW (ref 22–32)
Calcium: 8.5 mg/dL — ABNORMAL LOW (ref 8.9–10.3)
Chloride: 110 mmol/L (ref 98–111)
Creatinine, Ser: 2.8 mg/dL — ABNORMAL HIGH (ref 0.61–1.24)
GFR calc Af Amer: 24 mL/min — ABNORMAL LOW (ref >60)
GFR calc non Af Amer: 21 mL/min — ABNORMAL LOW (ref >60)
Glucose, Bld: 132 mg/dL — ABNORMAL HIGH (ref 70–99)
Potassium: 3.9 mmol/L (ref 3.5–5.1)
Sodium: 137 mmol/L (ref 135–145)
Total Bilirubin: 0.7 mg/dL (ref 0.3–1.2)
Total Protein: 6.9 g/dL (ref 6.5–8.1)

## 2018-04-05 NOTE — Progress Notes (Signed)
Subjective:  Dylan [name] is a 72 y.o. with PMH of CKD3, Diastolic CHF, Z5MZ, CAD, HTN, COPD, OSA admit for hypotension on hospital day 0  Dylan [name] was examined and evaluated at bedside this AM. He was observed lying comfortably in bed. He states he was in his usual state of health until yesterday morning when he felt dizzy shortly after taking his blood pressure medication. He has a bp cuff at home and when he measured it, he found his bp to be 75/40 and came to the ED. He states he had no acute overnight events. Able to tolerate breakfast this AM without problems. He was hoping to be out of the hospital by this morning so he could go to church but understands that he needs to stay. He denies any prior hx of confusion with his medication. He states he usually keeps himself well-hydrated throughout the day and uses his [facility] liberally to keep his house cool. Denies any F/N/V/D/C. Denies any chest pain, headaches, palpitations, dyspnea.  Objective:  Vital signs in last 24 hours: Vitals:   04/04/18 2200 04/04/18 2256 04/04/18 2351 04/05/18 0616  BP: 130/61 (!) 121/51 (!) 121/51 (!) 100/47  Pulse: 71 71 71 81  Resp: _0 Temp:  98.6 F (37 C) 98.6 F (37 C) 98 F (36.7 C)  TempSrc:  Oral Oral Oral  SpO2: 96% 97%  94%  Weight:  92.8 kg 98.6 kg   Height:   _1  (1.676 m)    Physical Exam  Constitutional: He is oriented to person, place, and time and well-developed, well-nourished, and in no distress. No distress.  HENT:  Head: Normocephalic and atraumatic.  Mouth/Throat: Oropharynx is clear and moist. No oropharyngeal exudate.  Eyes: Pupils are equal, round, and reactive to light. Conjunctivae and EOM are normal. No scleral icterus.  Neck: Normal range of motion. Neck supple.  Cardiovascular: Normal rate, regular rhythm, normal heart sounds and intact distal pulses.  Pulmonary/Chest: Effort normal and breath sounds normal.  Abdominal: Soft. Bowel sounds are normal.    Musculoskeletal: Normal range of motion.  Neurological: He is alert and oriented to person, place, and time. No cranial nerve deficit. GCS score is 15.  Skin: Skin is warm and dry. He is not diaphoretic.   Assessment/Plan:  Principal Problem:   AKI (acute kidney injury) ([place]) Active Problems:   OSA treated with BiPAP   DM (diabetes mellitus), type 2 with renal complications ([place])   Hypertension associated with diabetes ([place])   Stage 3 chronic renal impairment associated with type 2 diabetes mellitus ([place])   Anemia   Chronic diastolic heart failure ([place])   Gout   Coronary artery disease  Dylan [name] is a 72 y.o. with PMH of CKD3, Diastolic CHF, T8EW, CAD, HTN, COPD, OSA admit for hypotension. Presented with bp of 78/48. Yesterday received 1L bolus of LR and started on NS 125cc/hr. Bp now up to 100/47. Appears his bp is improving but not yet returned to baseline. Chart review shows his bp usually runs closer to 140/90. This episode of hypotension may have been caused by accidentally taking too much anti-hypertensive medications, especially his diuretics.  Hypotension 2/2 Dehydration vs Medication induced BP this AM 100/47 At home on hydralazine 41m TID, verapamil ER 3663mdaily, labetalol 20016mID, torsemide 47m83mD - C/w fluid resuscitation at NS 125cc/hr - Hold home meds  AKI on CKD Baseline creatinine 1.8. On admission 3.05, currently down to 2.8 this AM -  C/w fluid resuscitation at NS 125cc/hr - Hold nephrotoxic meds  Hx of HFpEF Will need to carefully monitor fluid status when giving fluids and holding diuretics - Strict I&O - Daily Weights  T2DM - SSI - Glucose checks - c/w home meds: glipizide 3m BID  DVT prophx: subqheparin Diet: Cardiac/diabetic Bowel: MIralax, Colace Code: Full  Dispo: Anticipated discharge in approximately 1 day(s).   LMosetta Anis MD 04/05/2018, 8:17 AM Pager: [phone]

## 2018-04-06 ENCOUNTER — Telehealth: Payer: Self-pay

## 2018-04-06 DIAGNOSIS — J449 Chronic obstructive pulmonary disease, unspecified: Secondary | ICD-10-CM

## 2018-04-06 DIAGNOSIS — G4733 Obstructive sleep apnea (adult) (pediatric): Secondary | ICD-10-CM

## 2018-04-06 DIAGNOSIS — I5032 Chronic diastolic (congestive) heart failure: Secondary | ICD-10-CM

## 2018-04-06 DIAGNOSIS — I2510 Atherosclerotic heart disease of native coronary artery without angina pectoris: Secondary | ICD-10-CM | POA: Diagnosis not present

## 2018-04-06 DIAGNOSIS — N183 Chronic kidney disease, stage 3 (moderate): Secondary | ICD-10-CM

## 2018-04-06 DIAGNOSIS — I959 Hypotension, unspecified: Secondary | ICD-10-CM

## 2018-04-06 DIAGNOSIS — E1122 Type 2 diabetes mellitus with diabetic chronic kidney disease: Secondary | ICD-10-CM

## 2018-04-06 DIAGNOSIS — E860 Dehydration: Secondary | ICD-10-CM

## 2018-04-06 DIAGNOSIS — R011 Cardiac murmur, unspecified: Secondary | ICD-10-CM

## 2018-04-06 DIAGNOSIS — I1310 Hypertensive heart and chronic kidney disease without heart failure, with stage 1 through stage 4 chronic kidney disease, or unspecified chronic kidney disease: Secondary | ICD-10-CM

## 2018-04-06 DIAGNOSIS — Z888 Allergy status to other drugs, medicaments and biological substances status: Secondary | ICD-10-CM

## 2018-04-06 DIAGNOSIS — N179 Acute kidney failure, unspecified: Secondary | ICD-10-CM | POA: Diagnosis not present

## 2018-04-06 DIAGNOSIS — Z9989 Dependence on other enabling machines and devices: Secondary | ICD-10-CM

## 2018-04-06 DIAGNOSIS — Z79899 Other long term (current) drug therapy: Secondary | ICD-10-CM

## 2018-04-06 LAB — URINALYSIS, DIPSTICK ONLY
Bilirubin Urine: NEGATIVE
Glucose, UA: NEGATIVE mg/dL
Hgb urine dipstick: NEGATIVE
Ketones, ur: NEGATIVE mg/dL
Leukocytes, UA: NEGATIVE
Nitrite: NEGATIVE
Protein, ur: NEGATIVE mg/dL
Specific Gravity, Urine: 1.008 (ref 1.005–1.030)
pH: 5 (ref 5.0–8.0)

## 2018-04-06 LAB — RENAL FUNCTION PANEL
Albumin: 3.7 g/dL (ref 3.5–5.0)
Anion gap: 6 (ref 5–15)
BUN: 23 mg/dL (ref 8–23)
CO2: 22 mmol/L (ref 22–32)
Calcium: 8.9 mg/dL (ref 8.9–10.3)
Chloride: 110 mmol/L (ref 98–111)
Creatinine, Ser: 1.72 mg/dL — ABNORMAL HIGH (ref 0.61–1.24)
GFR calc Af Amer: 44 mL/min — ABNORMAL LOW (ref >60)
GFR calc non Af Amer: 38 mL/min — ABNORMAL LOW (ref >60)
Glucose, Bld: 96 mg/dL (ref 70–99)
Phosphorus: 2.6 mg/dL (ref 2.5–4.6)
Potassium: 3.7 mmol/L (ref 3.5–5.1)
Sodium: 138 mmol/L (ref 135–145)

## 2018-04-06 LAB — GLUCOSE, CAPILLARY: Glucose-Capillary: 87 mg/dL (ref 70–99)

## 2018-04-06 MED ORDER — TORSEMIDE 10 MG PO TABS: ORAL_TABLET | ORAL | 0 refills | Status: DC

## 2018-04-06 MED ORDER — LABETALOL HCL 200 MG PO TABS
400.0000 mg | ORAL_TABLET | Freq: Two times a day (BID) | ORAL | Status: DC
  Administered 2018-04-06: 400 mg via ORAL
  Filled 2018-04-06: qty 2

## 2018-04-06 MED ORDER — VERAPAMIL HCL ER 180 MG PO TBCR
360.0000 mg | EXTENDED_RELEASE_TABLET | Freq: Every day | ORAL | Status: DC
  Administered 2018-04-06: 360 mg via ORAL
  Filled 2018-04-06: qty 2

## 2018-04-06 NOTE — Progress Notes (Signed)
    Subjective:  Dylan [name] is a 72 y.o. with PMH of CKD3, Diastolic CHF, F4EL, CAD, HTN, COPD, OSA admit for hypotension on hospital day 1  Dylan [name] was examined at bedside and was found eating breakfast. Reports that he slept pretty good. He was in a very pleasant mood. He denies headaches, chest pain, Palpitation. States he has not been drinking fluids because he was told not to. We explained to him the possible causes of his hypotension and assured him that we have slowly started some of his home blood pressure medications. He states that he will monitor his medications before administering them. Denies any headaches/blurry vision/ chest pain/ dyspnea/ palpitations  Objective:  Vital signs in last 24 hours: Vitals:   04/06/18 0500 04/06/18 1004 04/06/18 1005 04/06/18 1104  BP: (!) 168/65 (!) 161/76  (!) 182/83  Pulse: 72  86 65  Resp: 18   16  Temp: 99.3 F (37.4 C)   98.9 F (37.2 C)  TempSrc: Oral   Oral  SpO2: 97%   97%  Weight: 96.2 kg     Height:       Physical Exam  Constitutional: He is oriented to person, place, and time and well-developed, well-nourished, and in no distress.  HENT:  Head: Normocephalic and atraumatic.  Mouth/Throat: Oropharynx is clear and moist. No oropharyngeal exudate.  Eyes: Pupils are equal, round, and reactive to light. Conjunctivae and EOM are normal. No scleral icterus.  Neck: Normal range of motion. Neck supple.  Cardiovascular: Normal rate, regular rhythm and intact distal pulses.  Murmur (systolic murmur) heard. Pulmonary/Chest: Effort normal and breath sounds normal.  Abdominal: Soft. Bowel sounds are normal.  Musculoskeletal: Normal range of motion. He exhibits no edema.  Neurological: He is alert and oriented to person, place, and time. GCS score is 15.  Skin: Skin is warm and dry.   exa  Assessment/Plan:  Principal Problem:   AKI (acute kidney injury) ([place]) Active Problems:   OSA treated with BiPAP   DM (diabetes mellitus),  type 2 with renal complications ([place])   Hypertension associated with diabetes ([place])   Stage 3 chronic renal impairment associated with type 2 diabetes mellitus ([place])   Anemia   Chronic diastolic heart failure ([place])   Gout   Coronary artery disease  Dylan [name] is a 72 y.o. with PMH of CKD3, Diastolic CHF, T5VU, CAD, HTN, COPD, OSA admit for hypotension. Bp this AM hypertensive. Back to his 'baseline' Restarted home meds. AKi resolved. Creatinine back to baseline. Discharge today  Hypotension 2/2 Dehydration vs Medication induced BP this AM 168/65 - Resolved - Discharge today  AKI on CKD Baseline creatinine 1.8. On admission 3.05, currently down to 1.8 this AM - Resolved   DVT prophx: subqheparin Diet: Cardiac/diabetic Bowel: MIralax, Colace Code: Full  Dispo: Anticipated discharge in approximately today(s).   Mosetta Anis, MD 04/06/2018, 2:20 PM Pager: [phone]

## 2018-04-06 NOTE — Telephone Encounter (Signed)
Hospital TOC per Dr Truman Hayward, discharge 04/06/2018, appt 04/13/2018.

## 2018-04-06 NOTE — Care Management Obs Status (Signed)
[place] NOTIFICATION   Patient Details  Name: Dylan [name] MRN: [id number] Date of Birth: 1945-07-17   Medicare Observation Status Notification Given:  Yes   Midge Minium RN, BSN, NCM-BC, ACM-RN [phone] 04/06/2018, 10:21 AM

## 2018-04-06 NOTE — Discharge Instructions (Signed)
Dylan [name]  You came to [place] with complaints of dizziness and low blood pressure. We found that you were very dehydrated and gave you a lot of fluids as well as stopping some of your blood pressure medications. Your blood pressure has come up and here are our recommendations for you:  - Take 1 tablet of torsemide in the morning and 1 tablet at night - Weigh yourself everyday - If you see that your weight is up by more than 3lbs in 1 day, you can take another dose of torsemide - Take all of your other medications as prescribed.  Thank you for choosing [facility]

## 2018-04-06 NOTE — Discharge Summary (Signed)
Name: Dylan [name] MRN: [id number] DOB: Jun 21, 1946 72 y.o. PCP: Bartholomew Crews, MD  Date of Admission: 04/04/2018  2:43 PM Date of Discharge: 04/06/2018 10:00 AM Attending Physician: Larey Dresser  Discharge Diagnosis: 1. Hypotension 2. AKI on CKD3  Discharge Medications: Allergies as of 04/06/2018      Reactions   Ace Inhibitors Other (See Comments)   ARF - see CRF overview   Spironolactone Other (See Comments)   Gynecomastia per pt report.      Medication List    STOP taking these medications   diclofenac sodium 1 % Gel Commonly known as:  VOLTAREN   docusate sodium 100 MG capsule Commonly known as:  COLACE   loratadine 10 MG tablet Commonly known as:  CLARITIN   triamcinolone 55 MCG/ACT Aero nasal inhaler Commonly known as:  NASACORT     TAKE these medications   albuterol 108 (90 Base) MCG/ACT inhaler Commonly known as:  PROVENTIL HFA;VENTOLIN HFA Inhale 1-2 puffs into the lungs every 6 (six) hours as needed for wheezing or shortness of breath.   allopurinol 100 MG tablet Commonly known as:  ZYLOPRIM Take 1 tablet (100 mg total) by mouth daily.   aspirin 81 MG chewable tablet Chew 1 tablet (81 mg total) by mouth daily.   calcitRIOL 0.25 MCG capsule Commonly known as:  ROCALTROL Take 0.25 mcg by mouth daily.   eplerenone 50 MG tablet Commonly known as:  INSPRA Take 1 tablet (50 mg total) by mouth 2 (two) times daily.   FREESTYLE LITE test strip Generic drug:  glucose blood USE 1 STRIP TO CHECK GLUCOSE ONCE DAILY WITH MEALS AND AT BEDTIME   glipiZIDE 5 MG tablet Commonly known as:  GLUCOTROL Take 1 tablet (5 mg total) by mouth 2 (two) times daily before a meal.   hydrALAZINE 50 MG tablet Commonly known as:  APRESOLINE Take 2 tablets (100 mg total) by mouth 3 (three) times daily.   ketotifen 0.025 % ophthalmic solution Commonly known as:  ZADITOR Place 1 drop into both eyes 2 (two) times daily.   labetalol 200 MG tablet Commonly  known as:  NORMODYNE Take 2 tablets (400 mg total) by mouth 2 (two) times daily.   minoxidil 10 MG tablet Commonly known as:  LONITEN Take 2 tablets (20 mg total) by mouth daily.   polyethylene glycol packet Commonly known as:  MIRALAX / GLYCOLAX Take 17 g by mouth daily.   Potassium Chloride ER 20 MEQ Tbcr Take 20 mEq by mouth daily.   pravastatin 40 MG tablet Commonly known as:  PRAVACHOL Take 1 tablet (40 mg total) by mouth every evening. What changed:  additional instructions   torsemide 10 MG tablet Commonly known as:  DEMADEX Take 1 tablets in morning and 1 tablet in the evening. If your weight is up by 3lbs in 1 day, take another dose What changed:  additional instructions   verapamil 360 MG 24 hr capsule Commonly known as:  VERELAN PM Take 1 capsule (360 mg total) by mouth daily.       Disposition and follow-up:   Dylan [name] was discharged from [facility] in Good condition.  At the hospital follow up visit please address:  1. Hypotension: - Came w/ hypotension and dehydration - Resolved w/ fluids - Suspect medication overdose - Discharged w/ reduced dose of torsemide - Please adjust his anti-hypertensive regimen as appropriate  2. AKI on CKD - Presented with creatinine of 3.0 from baseline of 1.8 -  On discharge creatinine 1.72 - Please check that his AKI has stayed resolved  2. Labs / imaging needed at time of follow-up: BMP  3. Pending labs/ test needing follow-up: None  Follow-up Appointments: Follow-up Information    Bartholomew Crews, MD. Go on 04/13/2018.   Specialty:  Internal Medicine Why:  10:15 AM Contact information: [name] [address] [place] Hospital Course by problem list: 1. Hypotension 2/2 dehydration: Presented w/ bp of 78/48. Dehydrated on exam. Given 1L bolus of LR. Started on aggressive fluid resuscitation. CT abd/pelvis negative for abnormalities. BP rose to  baseline on day 1. Restarted home blood pressure medications. Discharged on home blood pressure medications w/ reduced dose of torsemide.  2. AKI on CKD: Presented with creatinine of 3.05 (baseline 1.8) Given LR bolus. Started trending up. Started on aggressive fluid resuscitation. Creatinine resolved to baseline. Discharged at creatinine of 1.72  Discharge Vitals:   BP (!) 182/83 (BP Location: Left Arm)   Pulse 65   Temp 98.9 F (37.2 C) (Oral)   Resp 16   Ht _0  (1.676 m)   Wt 96.2 kg   SpO2 97%   BMI 34.23 kg/m   Pertinent Labs, Studies, and Procedures:  BMP Latest Ref Rng & Units 04/06/2018 04/05/2018 04/04/2018  Glucose 70 - 99 mg/dL 96 132(H) 154(H)  BUN 8 - 23 mg/dL 23 39(H) 37(H)  Creatinine 0.61 - 1.24 mg/dL 1.72(H) 2.80(H) 3.05(H)  BUN/Creat Ratio 10 - 24 - - -  Sodium 135 - 145 mmol/L 138 137 136  Potassium 3.5 - 5.1 mmol/L 3.7 3.9 4.7  Chloride 98 - 111 mmol/L 110 110 107  CO2 22 - 32 mmol/L 22 18(L) 22  Calcium 8.9 - 10.3 mg/dL 8.9 8.5(L) 8.9   Discharge Instructions: Dylan [name]  You came to [place] with complaints of dizziness and low blood pressure. We found that you were very dehydrated and gave you a lot of fluids as well as stopping some of your blood pressure medications. Your blood pressure has come up and here are our recommendations for you:  - Take 1 tablet of torsemide in the morning and 1 tablet at night - Weigh yourself everyday - If you see that your weight is up by more than 3lbs in 1 day, you can take another dose of torsemide - Take all of your other medications as prescribed.  Thank you for choosing [place]  Discharge Instructions    Call MD for:  difficulty breathing, headache or visual disturbances   Complete by:  As directed    Call MD for:  extreme fatigue   Complete by:  As directed    Call MD for:  persistant dizziness or light-headedness   Complete by:  As directed    Diet - low sodium heart healthy   Complete by:  As directed     Increase activity slowly   Complete by:  As directed       Signed: Mosetta Anis, MD 04/06/2018, 2:52 PM   Pager: [phone]

## 2018-04-06 NOTE — Progress Notes (Signed)
  Date: 04/06/2018  Patient name: Dylan [name]  Medical record number: [id number]  Date of birth: 05/15/1946   I have seen and evaluated this patient and I have discussed the plan of care with the house staff. Please see their note for complete details. I concur with their findings with the following additions/corrections: Dylan [name] was seen on team morning rounds.  He was sitting on the side of the bed eating breakfast.  He denies any complaints today.  His blood pressure has returned to his baseline, which is slightly elevated.  Dr. Truman Hayward has resumed his labetalol and verapamil.  He has not yet resumed his furosemide, minoxidil, or hydralazine.  His AKI has fully resolved.  His creatinine was 1.72 which is solidly within his baseline range.  He is stable to be discharged home.  Bartholomew Crews, MD 04/06/2018, 10:11 AM

## 2018-04-09 NOTE — Telephone Encounter (Addendum)
Transition Care Management Follow-up Telephone Call  Date of discharge and from where: 04/06/2018 [facility]  How have you been since you were released from the hospital? "Just fine." Denies SHOB, edema, orthopnea, H/A, vision changes, fatigue, dizziness.  Any questions or concerns? Yes , they told me I had kidney issues." Explained how dehydration had caused AKI which was improved at discharge.  Items Reviewed:  Did the pt receive and understand the discharge instructions provided? Yes  Reviewed AVS together  Medications obtained and verified? Yes  Patient had been taking full tab torsemide in AM but only half tab of torsemide at night. Referred to page 8 of AVS which instructed to take full tab AM and PM. Patient had not been checking weights. Reviewed best way to weigh daily and record and take additional torsemide for 3+ lb weight gain in one day. Patient using home oxygen at 2 L during day only. States unable to use BiPap as he has difficulty breathing with it and it caused an infection.  Any new allergies since your discharge? No   Dietary orders reviewed? Yes, discussed not adding or cooking with salt but to try Mrs Deliah [place] for flavoring  Do you have support at home? Yes , 52 yo granddaughter lives with patient.  Functional Questionnaire: (I = Independent and D = Dependent) ADLs: I  Bathing/Dressing- I  Meal Prep- I  Eating- I  Maintaining continence- I [place] occasionally leaks urine before getting to toilet.  Transferring/Ambulation- I  Managing Meds- I  Follow up appointments reviewed:   PCP Hospital f/u appt confirmed? Yes  Scheduled to see [place] on 04/13/2018 @ 1015.  [place] [facility] f/u appt confirmed? NA   Are transportation arrangements needed? No  Patient drives own vehicle.  If their condition worsens, is the pt aware to call PCP or go to the Emergency Dept.? Yes  Was the patient provided with contact information for the PCP's office or ED? Yes,  including after hours protocol  Was to pt encouraged to call back with questions or concerns? Yes

## 2018-04-13 ENCOUNTER — Ambulatory Visit (INDEPENDENT_AMBULATORY_CARE_PROVIDER_SITE_OTHER): Payer: Medicare Other | Admitting: Internal Medicine

## 2018-04-13 ENCOUNTER — Other Ambulatory Visit: Payer: Self-pay

## 2018-04-13 VITALS — BP 130/53 | HR 74 | Temp 98.4°F | Ht 66.0 in | Wt 203.0 lb

## 2018-04-13 DIAGNOSIS — I5032 Chronic diastolic (congestive) heart failure: Secondary | ICD-10-CM | POA: Diagnosis not present

## 2018-04-13 DIAGNOSIS — E1122 Type 2 diabetes mellitus with diabetic chronic kidney disease: Secondary | ICD-10-CM

## 2018-04-13 DIAGNOSIS — Z79899 Other long term (current) drug therapy: Secondary | ICD-10-CM

## 2018-04-13 DIAGNOSIS — I130 Hypertensive heart and chronic kidney disease with heart failure and stage 1 through stage 4 chronic kidney disease, or unspecified chronic kidney disease: Secondary | ICD-10-CM | POA: Diagnosis not present

## 2018-04-13 DIAGNOSIS — Z7982 Long term (current) use of aspirin: Secondary | ICD-10-CM | POA: Diagnosis not present

## 2018-04-13 DIAGNOSIS — Z87891 Personal history of nicotine dependence: Secondary | ICD-10-CM

## 2018-04-13 DIAGNOSIS — N183 Chronic kidney disease, stage 3 unspecified: Secondary | ICD-10-CM

## 2018-04-13 DIAGNOSIS — N179 Acute kidney failure, unspecified: Secondary | ICD-10-CM

## 2018-04-13 NOTE — Patient Instructions (Signed)
FOLLOW-UP INSTRUCTIONS When: With PCP in 2-3 months For: Routine visit with PCP What to bring: All of your medications  I have not changes your medications.  We will notify you of your kidney function and electrolytes in the next 2-3 days.  As always if your symptoms worsen, fail to improve, or your develop other concerning symptoms, please notify our office or visit the local ER if we are unavailable. Symptoms including lack of urination, abdominal pain, weight gain >5lbs in two days, should not be ignored and should encourage you to call our clinic visit the ED if severe.  Thank you for your visit to the [facility] today.

## 2018-04-13 NOTE — Progress Notes (Addendum)
   CC: Hospital discharge follow-up   HPI:Dylan [name] is a 72 y.o. male who presents for evaluation of acute on chronic renal injury. Please see individual problem based A/P for details.  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 Thought to most likely be 2/2 to dehydration upon admission. Patients weight is stable at ~204 +/- 1lb daily over the past 6 days but 203lbs today, notably lower than the 212lbs noted on 09/30 when he was felt to be dehydrated. I am concerned that due to his increased dose of Torsemide from 72m am and 150mPM that he may be over diuresing as he is now on 2056mID. I feel that he likely needs a decrease dose.  Plan: Repeat BMP today. BMP indicating worsening renal function which I am concerned may be due to dehydration again. I will notify his PCP of this finding prior to making any medication changes.   Addendum: Attempted to contact patient via phone. There was no response and no voicemail. Would like for patient to be on 79m37mrsemide BID.  CHF: Patient weight stable today but decreased from recent admission. He continues to take his Torsemide 20mg13m and denies dyspnea, chest pain, palpitations, pedal or tibial edema or orthopnea.  He understands to call our clinic if he develops >2-3lbs of weight gain in 1-2 days or dyspnea. (see CKD for additional details) Will discuss his treatment regimen with PCP and determine if changes are appropriate.   Past Medical History:  Diagnosis Date  . Anemia 10/18/2014   Baseline about 12 and stable from 2010 to 2016. [place]  EGD Dr Hung Benson [place]   . Chronic diastolic heart failure ([name]) [place]2/2016   Noted ECHO 10/2014. Grade 2. EF 50-55%   . Chronic renal failure, stage 3 (moderate) ([name]) 10/18/2014   Baseline Cr about 1.5. Stable from 2010 to 2016.   . [place]ary artery disease   . Diverticulosis 10/08/2014   Seen on CT. [id number]. Freq bouts of  diverticulitis.  . DM (diabetes mellitus), type 2 with renal complications ([name]) [place]/24/3/3295ssential hypertension 10/09/2014   Poor control with 5 drug therapy.   . Former tobacco use 10/08/2014  . Gout   . OSA on CPAP 10/08/2014  . Pulmonary HTN ([name]) [place]2/2016   Noted as severe ECHO 2016. Likely 2/2 severe OSA.   Review of Systems:  ROS negative except as per HPI.  Physical Exam: Vitals:   04/13/18 1021  BP: (!) 130/53  Pulse: 74  Temp: 98.4 F (36.9 C)  TempSrc: Oral  SpO2: 97%  Weight: 203 lb (92.1 kg)  Height: _0  (1.676 m)   General: A/O x 4, in no acute distress, afebrile, nondiaphoretic Cardio: RRR, no mrg's Pulmonary: CTA bilaterally, no wheezing or rails MSK: Bilateral lower extremities nontender, nonedematous with increased skin turgor.   Assessment & Plan:   See Encounters Tab for problem based charting.  Patient discussed with Dr. NarenDareen Piano

## 2018-04-14 LAB — BMP8+ANION GAP
Anion Gap: 19 mmol/L — ABNORMAL HIGH (ref 10.0–18.0)
BUN/Creatinine Ratio: 20 (ref 10–24)
BUN: 44 mg/dL — ABNORMAL HIGH (ref 8–27)
CO2: 22 mmol/L (ref 20–29)
Calcium: 9.6 mg/dL (ref 8.6–10.2)
Chloride: 101 mmol/L (ref 96–106)
Creatinine, Ser: 2.24 mg/dL — ABNORMAL HIGH (ref 0.76–1.27)
GFR calc Af Amer: 32 mL/min/{1.73_m2} — ABNORMAL LOW (ref >59)
GFR calc non Af Amer: 28 mL/min/{1.73_m2} — ABNORMAL LOW (ref >59)
Glucose: 165 mg/dL — ABNORMAL HIGH (ref 65–99)
Potassium: 4.4 mmol/L (ref 3.5–5.2)
Sodium: 142 mmol/L (ref 134–144)

## 2018-04-14 NOTE — Assessment & Plan Note (Signed)
CHF: Patient weight stable today but decreased from recent admission. He continues to take his Torsemide 34m BID and denies dyspnea, chest pain, palpitations, pedal or tibial edema or orthopnea.  He understands to call our clinic if he develops >2-3lbs of weight gain in 1-2 days or dyspnea. (see CKD for additional details) Will discuss his treatment regimen with PCP and determine if changes are appropriate.

## 2018-04-14 NOTE — Assessment & Plan Note (Signed)
See CKD

## 2018-04-14 NOTE — Progress Notes (Signed)
Chart review AKI 09/01/2017 on torsemide 40 & 10 Admission Sept for AKI on 20 & 10 D/C'd on 10 & 10 Dr Berline Lopes documented at [facility] he was taking 20 & 20   50 mg daily and 40 daily too much. Would recommend 30 (20 & 10) or 20 (10 & 10). Since weight down, would start with 20 (10 BID) and F/U weights. Sending back to Dr Berline Lopes to coordinate with pt.

## 2018-04-14 NOTE — Assessment & Plan Note (Addendum)
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 Thought to most likely be 2/2 to dehydration upon admission. Patients weight is stable at ~204 +/- 1lb daily over the past 6 days but 203lbs today, notably lower than the 212lbs noted on 09/30 when he was felt to be dehydrated. I am concerned that due to his increased dose of Torsemide from 57m am and 121mPM that he may be over diuresing as he is now on 2063mID. I feel that he likely needs a decrease dose.  Plan: Repeat BMP today. BMP indicating worsening renal function which I am concerned may be due to dehydration again. I will notify his PCP of this finding prior to making any medication changes.    Addendum: Attempted to contact patient via phone. There was no response and no voicemail. Would like for patient to be on 52m76mrsemide BID.

## 2018-04-15 NOTE — Progress Notes (Signed)
Internal Medicine Clinic Attending  Case discussed with Dr. Berline Lopes at the time of the visit.  We reviewed the resident's history and exam and pertinent patient test results.  I agree with the assessment, diagnosis, and plan of care documented in the resident's note  I agree with decreasing the dose to 10 mg bid and having him follow up for repeat blood work.

## 2018-04-16 DIAGNOSIS — N2581 Secondary hyperparathyroidism of renal origin: Secondary | ICD-10-CM | POA: Diagnosis not present

## 2018-04-20 ENCOUNTER — Other Ambulatory Visit: Payer: Self-pay | Admitting: Nephrology

## 2018-04-20 DIAGNOSIS — N183 Chronic kidney disease, stage 3 unspecified: Secondary | ICD-10-CM

## 2018-04-23 ENCOUNTER — Ambulatory Visit
Admission: RE | Admit: 2018-04-23 | Discharge: 2018-04-23 | Disposition: A | Discharge status: Home / Self Care | Payer: Medicare Other | Source: Ambulatory Visit | Attending: Nephrology | Admitting: Nephrology

## 2018-04-23 DIAGNOSIS — N183 Chronic kidney disease, stage 3 unspecified: Secondary | ICD-10-CM

## 2018-05-20 DIAGNOSIS — I129 Hypertensive chronic kidney disease with stage 1 through stage 4 chronic kidney disease, or unspecified chronic kidney disease: Secondary | ICD-10-CM | POA: Diagnosis not present

## 2018-05-20 DIAGNOSIS — D631 Anemia in chronic kidney disease: Secondary | ICD-10-CM | POA: Diagnosis not present

## 2018-05-20 DIAGNOSIS — M109 Gout, unspecified: Secondary | ICD-10-CM | POA: Diagnosis not present

## 2018-05-20 DIAGNOSIS — N189 Chronic kidney disease, unspecified: Secondary | ICD-10-CM | POA: Diagnosis not present

## 2018-05-20 DIAGNOSIS — N183 Chronic kidney disease, stage 3 (moderate): Secondary | ICD-10-CM | POA: Diagnosis not present

## 2018-05-20 DIAGNOSIS — E1122 Type 2 diabetes mellitus with diabetic chronic kidney disease: Secondary | ICD-10-CM | POA: Diagnosis not present

## 2018-05-20 DIAGNOSIS — I5030 Unspecified diastolic (congestive) heart failure: Secondary | ICD-10-CM | POA: Diagnosis not present

## 2018-05-20 DIAGNOSIS — N2581 Secondary hyperparathyroidism of renal origin: Secondary | ICD-10-CM | POA: Diagnosis not present

## 2018-05-20 DIAGNOSIS — I272 Pulmonary hypertension, unspecified: Secondary | ICD-10-CM | POA: Diagnosis not present

## 2018-05-20 LAB — BASIC METABOLIC PANEL
BUN: 36 — AB (ref 4–21)
Creatinine: 2 — AB (ref 0.6–1.3)
Glucose: 161
Potassium: 4.2 (ref 3.4–5.3)
Sodium: 140 (ref 137–147)

## 2018-06-01 ENCOUNTER — Encounter: Payer: Self-pay | Admitting: Internal Medicine

## 2018-06-16 ENCOUNTER — Other Ambulatory Visit: Payer: Self-pay | Admitting: Internal Medicine

## 2018-06-16 DIAGNOSIS — I10 Essential (primary) hypertension: Secondary | ICD-10-CM

## 2018-06-17 ENCOUNTER — Other Ambulatory Visit: Payer: Self-pay | Admitting: Internal Medicine

## 2018-06-17 DIAGNOSIS — E876 Hypokalemia: Secondary | ICD-10-CM

## 2018-06-18 ENCOUNTER — Ambulatory Visit (INDEPENDENT_AMBULATORY_CARE_PROVIDER_SITE_OTHER): Payer: Medicare Other | Admitting: Internal Medicine

## 2018-06-18 ENCOUNTER — Other Ambulatory Visit: Payer: Self-pay

## 2018-06-18 ENCOUNTER — Encounter: Payer: Self-pay | Admitting: Internal Medicine

## 2018-06-18 VITALS — BP 135/64 | HR 66 | Temp 98.3°F | Wt 214.9 lb

## 2018-06-18 DIAGNOSIS — E1121 Type 2 diabetes mellitus with diabetic nephropathy: Secondary | ICD-10-CM | POA: Diagnosis not present

## 2018-06-18 DIAGNOSIS — I152 Hypertension secondary to endocrine disorders: Secondary | ICD-10-CM

## 2018-06-18 DIAGNOSIS — Z23 Encounter for immunization: Secondary | ICD-10-CM | POA: Diagnosis not present

## 2018-06-18 DIAGNOSIS — Z9989 Dependence on other enabling machines and devices: Secondary | ICD-10-CM

## 2018-06-18 DIAGNOSIS — I130 Hypertensive heart and chronic kidney disease with heart failure and stage 1 through stage 4 chronic kidney disease, or unspecified chronic kidney disease: Secondary | ICD-10-CM

## 2018-06-18 DIAGNOSIS — I5032 Chronic diastolic (congestive) heart failure: Secondary | ICD-10-CM

## 2018-06-18 DIAGNOSIS — Z79899 Other long term (current) drug therapy: Secondary | ICD-10-CM | POA: Diagnosis not present

## 2018-06-18 DIAGNOSIS — M109 Gout, unspecified: Secondary | ICD-10-CM | POA: Diagnosis not present

## 2018-06-18 DIAGNOSIS — N183 Chronic kidney disease, stage 3 unspecified: Secondary | ICD-10-CM

## 2018-06-18 DIAGNOSIS — G4733 Obstructive sleep apnea (adult) (pediatric): Secondary | ICD-10-CM | POA: Diagnosis not present

## 2018-06-18 DIAGNOSIS — Z87891 Personal history of nicotine dependence: Secondary | ICD-10-CM

## 2018-06-18 DIAGNOSIS — Z7984 Long term (current) use of oral hypoglycemic drugs: Secondary | ICD-10-CM | POA: Diagnosis not present

## 2018-06-18 DIAGNOSIS — E1122 Type 2 diabetes mellitus with diabetic chronic kidney disease: Secondary | ICD-10-CM | POA: Diagnosis not present

## 2018-06-18 DIAGNOSIS — Z7982 Long term (current) use of aspirin: Secondary | ICD-10-CM

## 2018-06-18 DIAGNOSIS — I10 Essential (primary) hypertension: Secondary | ICD-10-CM

## 2018-06-18 DIAGNOSIS — M1000 Idiopathic gout, unspecified site: Secondary | ICD-10-CM

## 2018-06-18 DIAGNOSIS — E1159 Type 2 diabetes mellitus with other circulatory complications: Secondary | ICD-10-CM

## 2018-06-18 LAB — POCT GLYCOSYLATED HEMOGLOBIN (HGB A1C): Hemoglobin A1C: 6.3 % — AB (ref 4.0–5.6)

## 2018-06-18 LAB — GLUCOSE, CAPILLARY: Glucose-Capillary: 179 mg/dL — ABNORMAL HIGH (ref 70–99)

## 2018-06-18 NOTE — Assessment & Plan Note (Signed)
This problem is chronic and stable.  He has been off of his loop diuretic for at least 2 days.  He has gained 1 pound per patient report over that time.  He states he has gained 7 pounds over the past year.  He does not weigh himself every day.  His lungs are clear.  He does not have dyspnea on exertion.  I refilled his Demadex and gave him written instructions about weighing himself and letting us know about weight gain.  PLAN : Resume his Demadex

## 2018-06-18 NOTE — Assessment & Plan Note (Addendum)
This problem is chronic and controlled.  His A1c trend has been 6.3 - 6.0 - 6.3.  He is on glipizide 5 mg twice daily.  I have been worried about hypoglycemia which she has endorsed in the past.  Today, he states it has not been low 120.  In fact recently, it is been in the 200s because he has increased his intake of sweets.  He has not been able to tolerate a continuous glucose monitor.  At his last appointment, he was not willing to consider changing his medications.  However, now with his nephrologist has pushed preparing for hemodialysis, he was all for the idea of changing to a medication that had a renal protection.  I am going to be discussing switching him to either an SGLT2 (canagliflozin seems to be the one indicated and renal failure but concerned his degree of renal failure is too far progressed to use an SGLT2) or a GLP-1 (liraglutide seems to have the best evidence for nephropathy prevention but Ozempic would be once a week.).  I told him I would contact him in January with final recommendations.  PLAN : I would like to stop the glipizide and substitute with either a SGLT 2 or a GLP-1

## 2018-06-18 NOTE — Assessment & Plan Note (Signed)
This problem is chronic and stable.  We reviewed his creatinine graph in epic.  His GFR ranges between the 20s in the 40s.  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  He sees Dr. Justin Mend of nephrology and I reviewed his notes when available.  His most recent BMP was November 13 and I abstracted those values into epic.  He knows not to take any NSAID.  We discussed the best thing to protect his kidneys is risk factor management with hypertension and diabetes control and to consider a new diabetes medication.  PLAN : Follow nephrology's notes

## 2018-06-18 NOTE — Assessment & Plan Note (Signed)
This problem is chronic and uncontrolled.  He has severe obstructive sleep apnea with an AHI of 131.  This was performed this year.  He did not get his DME AutoPap and I will check with our staff about this.  He states he is not eager to get CPAP because he states his nasal cannula oxygen is doing well.  He had PND 3 years ago but since he started using oxygen a year ago, he has not had any issues with this.  However, due to his severe OSA, I definitely think that he would benefit from CPAP.  PLAN : Investigate AutoPap DME

## 2018-06-18 NOTE — Assessment & Plan Note (Signed)
This problem is chronic and uncontrolled.  He has been out of his Demadex for at least 2 days.  He is also on eplerenone 50 twice daily, labetalol 400 twice daily, verapamil 360 daily, minoxidil 20 daily, and hydralazine 100 3 times daily.  Due to his renal dysfunction, I think not having his diuretic is a big reason for his uncontrolled blood pressure today.  Therefore, I am not making any changes but I did refill his Demadex.  PLAN:  Cont current meds

## 2018-06-18 NOTE — Assessment & Plan Note (Signed)
This problem is chronic and stable.  He reports no further gout flares.  He remains on allopurinol 100 daily.  His most recent uric acid level was 7.8 but since he is asymptomatic, I am going to leave his allopurinol dose as is rather than titrate it up.  PLAN:  Cont current meds

## 2018-06-18 NOTE — Progress Notes (Signed)
   Subjective:    Patient ID: Dylan [name], male    DOB: [birth year], 72 y.o.   MRN: [id number]  HPI  Dylan [name] is here for DM F/U. Please see the A&P for the status of the pt's chronic medical problems.  ROS : per ROS section and in problem oriented charting. All other systems are negative.  PMHx, Soc hx, and / or Fam hx : He is using oxygen by nasal cannula but not CPAP.  His nephrologist tried to get him to enroll in hemodialysis preparation lectures but he declined.  Review of Systems  Constitutional: Negative for unexpected weight change.  Respiratory: Negative for shortness of breath.   Cardiovascular: Positive for leg swelling. Negative for chest pain.       Objective:   Physical Exam Constitutional:      General: He is not in acute distress.    Appearance: Normal appearance. He is obese. He is not ill-appearing, toxic-appearing or diaphoretic.  HENT:     Head: Normocephalic and atraumatic.     Right Ear: External ear normal.     Left Ear: External ear normal.     Nose: Nose normal. No congestion or rhinorrhea.  Eyes:     Extraocular Movements: Extraocular movements intact.     Conjunctiva/sclera: Conjunctivae normal.  Cardiovascular:     Rate and Rhythm: Normal rate and regular rhythm.     Heart sounds: No murmur.  Pulmonary:     Effort: Pulmonary effort is normal. No respiratory distress.     Breath sounds: Normal breath sounds. No rales.  Musculoskeletal:        General: No tenderness.     Right lower leg: Edema present.     Left lower leg: Edema present.  Skin:    General: Skin is warm and dry.  Neurological:     General: No focal deficit present.     Mental Status: He is alert.  Psychiatric:        Mood and Affect: Mood normal.        Behavior: Behavior normal.        Thought Content: Thought content normal.        Judgment: Judgment normal.           Assessment & Plan:

## 2018-06-18 NOTE — Patient Instructions (Signed)
1. See me in 3 months 2. I will fill your medicines today 3. Think about the sleep mask 4. Weigh yourself daily and call if you gain more than 3 pounds in one day or 5 pounds in one week 5. I am looking into a new sugar pill

## 2018-06-22 ENCOUNTER — Other Ambulatory Visit: Payer: Self-pay | Admitting: Internal Medicine

## 2018-06-22 DIAGNOSIS — I5032 Chronic diastolic (congestive) heart failure: Secondary | ICD-10-CM

## 2018-06-22 MED ORDER — TORSEMIDE 10 MG PO TABS: ORAL_TABLET | ORAL | 2 refills | Status: DC

## 2018-06-22 NOTE — Telephone Encounter (Signed)
Medication Refill Request    torsemide (DEMADEX) 10 MG tablet  [facility] [place], [place]

## 2018-07-29 ENCOUNTER — Other Ambulatory Visit: Payer: Self-pay | Admitting: Internal Medicine

## 2018-08-08 ENCOUNTER — Other Ambulatory Visit: Payer: Self-pay | Admitting: Internal Medicine

## 2018-08-11 NOTE — Telephone Encounter (Signed)
I refilled 1/23 to this pharmacy for one year

## 2018-08-26 ENCOUNTER — Other Ambulatory Visit: Payer: Self-pay | Admitting: Internal Medicine

## 2018-08-26 ENCOUNTER — Telehealth: Payer: Self-pay

## 2018-08-26 NOTE — Telephone Encounter (Signed)
Edie with [facility] pharmacy requesting a refill on [place].

## 2018-08-27 MED ORDER — COLCHICINE 0.6 MG PO TABS: ORAL_TABLET | ORAL | 0 refills | Status: DC

## 2018-09-17 ENCOUNTER — Encounter: Payer: Self-pay | Admitting: Internal Medicine

## 2018-09-17 ENCOUNTER — Ambulatory Visit (INDEPENDENT_AMBULATORY_CARE_PROVIDER_SITE_OTHER): Payer: Medicare Other | Admitting: Internal Medicine

## 2018-09-17 ENCOUNTER — Encounter (INDEPENDENT_AMBULATORY_CARE_PROVIDER_SITE_OTHER): Payer: Self-pay

## 2018-09-17 ENCOUNTER — Other Ambulatory Visit: Payer: Self-pay

## 2018-09-17 DIAGNOSIS — I70213 Atherosclerosis of native arteries of extremities with intermittent claudication, bilateral legs: Secondary | ICD-10-CM

## 2018-09-17 DIAGNOSIS — Z7984 Long term (current) use of oral hypoglycemic drugs: Secondary | ICD-10-CM

## 2018-09-17 DIAGNOSIS — Z79899 Other long term (current) drug therapy: Secondary | ICD-10-CM | POA: Diagnosis not present

## 2018-09-17 DIAGNOSIS — Z87891 Personal history of nicotine dependence: Secondary | ICD-10-CM

## 2018-09-17 DIAGNOSIS — I5032 Chronic diastolic (congestive) heart failure: Secondary | ICD-10-CM | POA: Diagnosis not present

## 2018-09-17 DIAGNOSIS — I152 Hypertension secondary to endocrine disorders: Secondary | ICD-10-CM

## 2018-09-17 DIAGNOSIS — E1159 Type 2 diabetes mellitus with other circulatory complications: Secondary | ICD-10-CM

## 2018-09-17 DIAGNOSIS — N289 Disorder of kidney and ureter, unspecified: Secondary | ICD-10-CM | POA: Diagnosis not present

## 2018-09-17 DIAGNOSIS — Z7982 Long term (current) use of aspirin: Secondary | ICD-10-CM | POA: Diagnosis not present

## 2018-09-17 DIAGNOSIS — I110 Hypertensive heart disease with heart failure: Secondary | ICD-10-CM | POA: Diagnosis not present

## 2018-09-17 DIAGNOSIS — E1129 Type 2 diabetes mellitus with other diabetic kidney complication: Secondary | ICD-10-CM

## 2018-09-17 DIAGNOSIS — E1121 Type 2 diabetes mellitus with diabetic nephropathy: Secondary | ICD-10-CM

## 2018-09-17 DIAGNOSIS — I10 Essential (primary) hypertension: Secondary | ICD-10-CM

## 2018-09-17 MED ORDER — LIRAGLUTIDE 18 MG/3ML ~~LOC~~ SOPN: PEN_INJECTOR | SUBCUTANEOUS | 0 refills | Status: DC

## 2018-09-17 NOTE — Assessment & Plan Note (Signed)
This problem is chronic and stable.  He does not play itself although he has scales.  He does not feel that he is volume overloaded today.  He has resumed his Demadex after being out of it for a couple of days prior to his last appointment.  He appears euvolemic today.  She is ready  PLAN:  Cont current meds

## 2018-09-17 NOTE — Assessment & Plan Note (Signed)
This problem is chronic and stable.  His A1c trend has been 6.0 - 6.3.  He is currently on glipizide 5 mg twice daily.  He denies recent hypoglycemia but has endorsed hypoglycemia in the past.  He tried CGM a year or so ago but was unable to tolerate the monitor.  He is not willing to give it another try.  At his last appointment, we had discussed switching him from glipizide which has a risk of hypoglycemia to a different medication which had both hypoglycemia effects but also renal protection.  He is agreeable to make this switch today.  Canagliflozin is contraindicated due to his GFR.  He has no CI to Liraglutide and I confirmed this with pharmacy.  He has used insulin in the past so is comfortable with subcutaneous injections.  I collaborated with pharmacy, Vonna Kotyk, who provided him with a sample and educated him on proper usage, side effects, and to stop his glipizide.  I am sending a prescription to his regular pharmacy.  PLAN: Stop glipizide Start liraglutide Return to see pharmacy in 2 weeks Return to see me in 3 months

## 2018-09-17 NOTE — Assessment & Plan Note (Signed)
This problem is chronic and stable.  He canceled his last couple of vascular appointments.  His diabetes is well controlled, he is on pravastatin 40, and aspirin 81 mg.  He is a non-smoker.  He is exercising more by walking which will certainly help.  He has no symptoms of claudication.  PLAN:  Cont current meds Continue risk factor management

## 2018-09-17 NOTE — Assessment & Plan Note (Signed)
This problem is chronic and stable.  He is on Demadex, eplerenone 50 twice daily, labetalol 400 twice daily, verapamil 360 daily, minoxidil 20 daily, and hydralazine 100 3 times daily.  Blood pressure was slightly elevated today that has been better in the past.  He has no side effects to these medications.  PLAN:  Cont current meds   BP Readings from Last 3 Encounters:  06/18/18 135/64  04/13/18 (!) 130/53  04/06/18 (!) 182/83

## 2018-09-17 NOTE — Progress Notes (Signed)
   Subjective:    Patient ID: Dylan [name], male    DOB: Nov 13, 1945, 73 y.o.   MRN: [id number]  HPI  Dylan [name] is here for DM F/U. Please see the A&P for the status of the pt's chronic medical problems.  ROS : per ROS section and in problem oriented charting. All other systems are negative.  PMHx, Soc hx, and / or Fam hx : He recently moved and is now living with his daughter and his 33 year old grandson.  His grandson plays the violin.  His grandson recently got a puppy -a lab pit mix named Lemon.  Dylan [name] is the one who is walking the puppy several times a day and he is noticing that his dyspnea is improving being more he walks Lemon.  Review of Systems  Constitutional: Negative for unexpected weight change.  Respiratory: Positive for shortness of breath.   Cardiovascular: Negative for leg swelling.       Objective:   Physical Exam Constitutional:      General: He is not in acute distress.    Appearance: Normal appearance. He is obese. He is not ill-appearing.  HENT:     Head: Normocephalic and atraumatic.     Right Ear: External ear normal.     Left Ear: External ear normal.     Nose: Nose normal. No congestion or rhinorrhea.  Eyes:     Extraocular Movements: Extraocular movements intact.     Conjunctiva/sclera: Conjunctivae normal.  Cardiovascular:     Rate and Rhythm: Normal rate and regular rhythm.     Heart sounds: Normal heart sounds.  Pulmonary:     Effort: Pulmonary effort is normal. No respiratory distress.  Musculoskeletal:        General: No tenderness or deformity.     Comments: Trace lower extremity edema left greater than right  Skin:    General: Skin is warm and dry.  Neurological:     Mental Status: He is alert.  Psychiatric:        Mood and Affect: Mood normal.        Behavior: Behavior normal.        Thought Content: Thought content normal.        Judgment: Judgment normal.           Assessment & Plan:

## 2018-09-17 NOTE — Progress Notes (Signed)
Medication Samples have been provided to the patient.  Drug name: Victoza       Strength: 59m/3ml        Qty: 1  LOT: [id number] Exp.Date: 04/2019  Dosing instructions: Inject 0.673msubcutaneously every day for one week then increase to 1.70m37mvery day  The patient has been instructed regarding the correct time, dose, and frequency of taking this medication, including desired effects and most common side effects.   Dylan [name] 10:46 AM 09/17/2018

## 2018-09-24 ENCOUNTER — Telehealth: Payer: Self-pay | Admitting: Pharmacist

## 2018-09-24 NOTE — Telephone Encounter (Signed)
Attempted to call patient to review Liraglutide drug. No answer.

## 2018-10-01 ENCOUNTER — Ambulatory Visit: Payer: Medicare Other | Admitting: Pharmacist

## 2018-10-08 ENCOUNTER — Telehealth: Payer: Self-pay | Admitting: *Deleted

## 2018-10-08 DIAGNOSIS — E1121 Type 2 diabetes mellitus with diabetic nephropathy: Secondary | ICD-10-CM

## 2018-10-08 MED ORDER — LIRAGLUTIDE 18 MG/3ML ~~LOC~~ SOPN: PEN_INJECTOR | SUBCUTANEOUS | 3 refills | Status: DC

## 2018-10-08 MED ORDER — PEN NEEDLES 31G X 5 MM MISC: 1.0000 | Freq: Every day | 3 refills | Status: DC

## 2018-10-08 NOTE — Telephone Encounter (Signed)
Unable to reach patient. I saw Dylan [name] also tried calling patient 3/19. Will try to work with pharmacy to help patient.

## 2018-10-08 NOTE — Telephone Encounter (Signed)
I do not see any liraglutide samples here in our office. Is this something his insurance will cover with a prescription?

## 2018-10-08 NOTE — Telephone Encounter (Signed)
Pt calls and reports that he needs needles and insulin, states dr kim's people were supposed to be helping me". He is reassured that he will be able to have some help today. Slight rise in cbg today 240, yesterday 210 he states it used to be about 140 hes a bit worried. Please call at [phone]. Sending to donnap and dr Maudie Mercury

## 2018-11-03 ENCOUNTER — Telehealth: Payer: Self-pay | Admitting: Cardiovascular Disease

## 2018-11-03 NOTE — Telephone Encounter (Signed)
Patient has calling restrictions on his phone and could not leave a msg.

## 2018-11-04 ENCOUNTER — Other Ambulatory Visit: Payer: Self-pay

## 2018-11-04 MED ORDER — EPLERENONE 50 MG PO TABS: 50.0000 mg | ORAL_TABLET | Freq: Two times a day (BID) | ORAL | 3 refills | Status: DC

## 2018-11-04 NOTE — Telephone Encounter (Signed)
eplerenone (INSPRA) 50 MG tablet, refill request @  [place], [place] [phone] (Phone) [phone] (Fax)

## 2018-11-11 ENCOUNTER — Other Ambulatory Visit: Payer: Self-pay | Admitting: Internal Medicine

## 2018-11-11 DIAGNOSIS — I10 Essential (primary) hypertension: Secondary | ICD-10-CM

## 2018-11-11 NOTE — Telephone Encounter (Signed)
[name] -would you please make contact with the patient via telephone and see how he is doing on the liraglutide?  Front desk -please schedule telehealth appointment with me in June.  Thanks

## 2018-11-12 NOTE — Telephone Encounter (Signed)
Dylan [name]' phone is not accepting calls at this time. I will try back again later.

## 2018-11-13 NOTE — Telephone Encounter (Signed)
Tried to call Dylan [name] yesterday and today. Both times I get the message that my call cannot be completed try back later. I will try calling him once more.

## 2018-11-16 ENCOUNTER — Encounter: Payer: Self-pay | Admitting: Dietician

## 2018-11-16 NOTE — Telephone Encounter (Signed)
Tried calling this patient. Their voicemail box is not set up. I was unable to leave a message Will mail letter asking patient to call the office. Debera Lat, RD 11/16/2018 9:19 AM.

## 2018-11-21 ENCOUNTER — Other Ambulatory Visit: Payer: Self-pay | Admitting: Internal Medicine

## 2018-11-23 NOTE — Telephone Encounter (Signed)
Pt needs refill on labetalol (NORMODYNE) 200 MG tablet [facility] [address], [place]  ;pt contact [phone]

## 2018-11-25 ENCOUNTER — Telehealth: Payer: Self-pay | Admitting: Dietician

## 2018-11-25 NOTE — Telephone Encounter (Signed)
Please have him increase liraglutide to 1.8 daily.-Call back in a week or 2 with his CBGs.  Please let me know if he needs a new prescription.

## 2018-11-25 NOTE — Telephone Encounter (Signed)
Mr. Lucchese calls saying that he does not think new medicine (Victoza) is working. Blood sugar in ams are now 190-200, this am it was 202-203 they were 130-140 before this medicine. Says he is taking 1.25m victoza, once day. I confirmed he is going through one pen per month. He reports no side effects. "just high blood sugars. When I asked him how we should get in touich with him with Dr. BZenovia Jarredresponse, he said he'd call me back tomorrow afternoon to find out what Dr. BLynnae Januarywants him to do.

## 2018-11-26 NOTE — Telephone Encounter (Signed)
Tried calling this patient. His voicemail box is not set up. I was unable to leave a message.

## 2018-11-27 NOTE — Telephone Encounter (Signed)
Call from pt about elevated BS; stated BS was 180 yesterday am and 171 this am. Pt instructed to "increas liraglutide to 1.8 daily-call back in a week or 2 with his CBGs" per Dr Lynnae January. Verbalized understanding with repeated instructions. Also stated he will need new rx for 1.8. Thanks

## 2018-12-06 IMAGING — US US RENAL
1 series · 14 of 25 positions shown · non-contrast
Comparison: CT 04/04/2018, ultrasound 08/28/2012

CLINICAL DATA: Stage 3 kidney disease

EXAM:
RENAL / URINARY TRACT ULTRASOUND COMPLETE

[Series 1: us renal · 0.25mm/px · 14 of 30 slices shown]
[im 1/30]
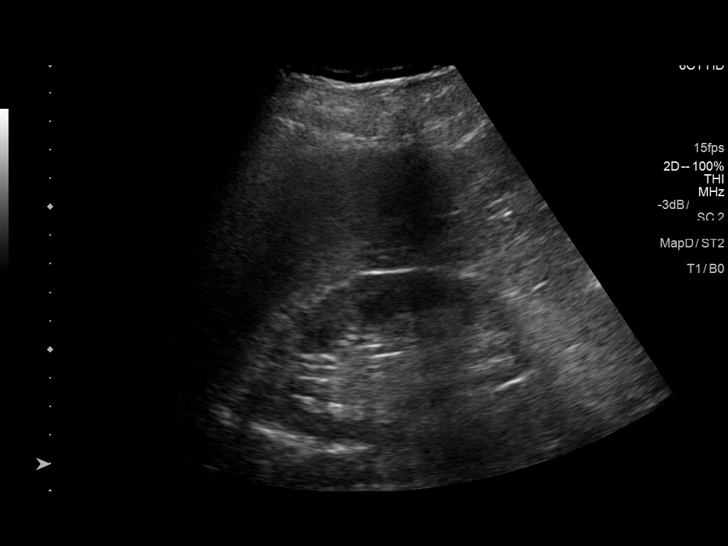
[im 3/30]
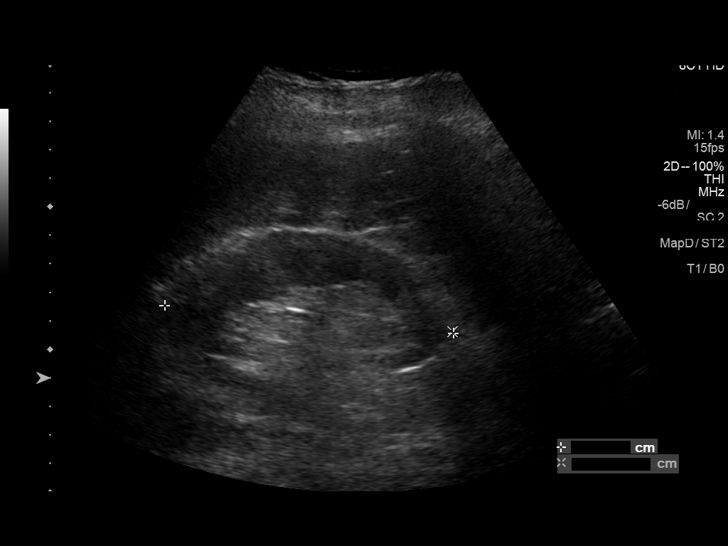
[im 5/30]
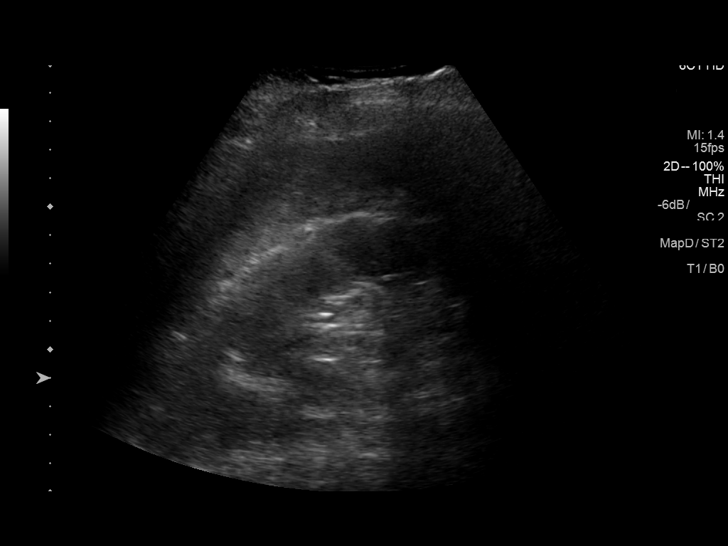
[im 8/30]
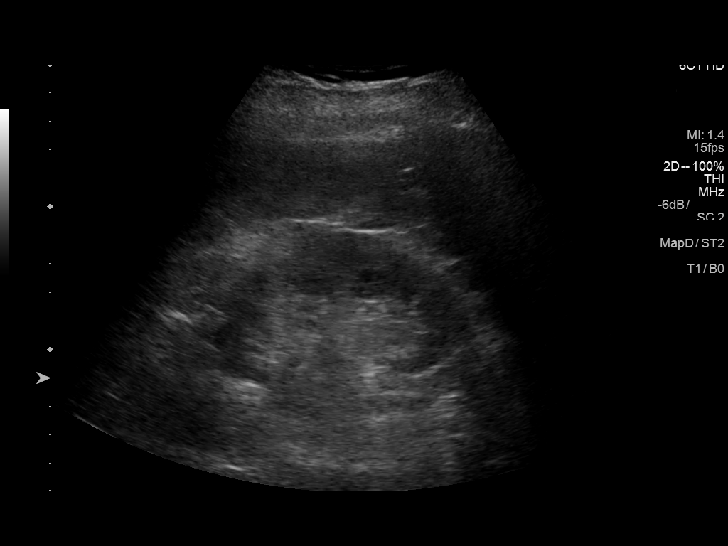
[im 10/30]
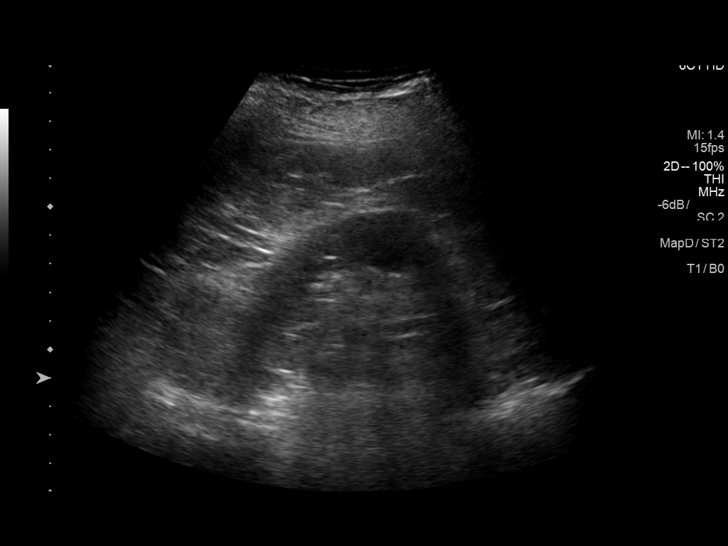
[im 11/30]
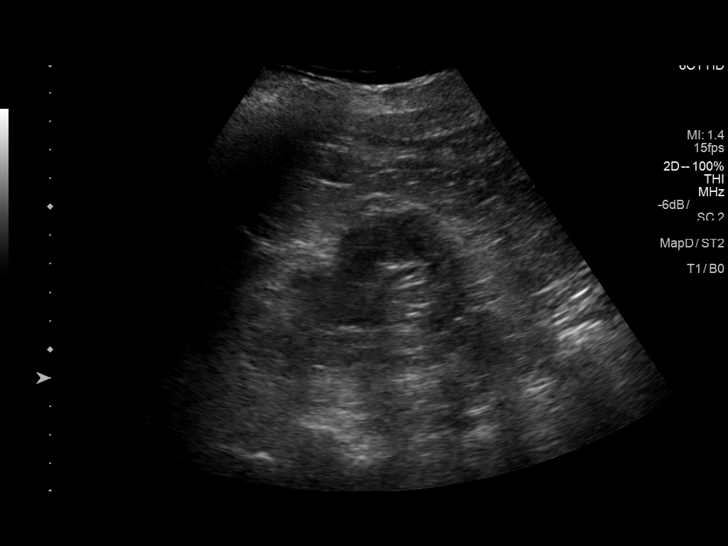
[im 14/30]
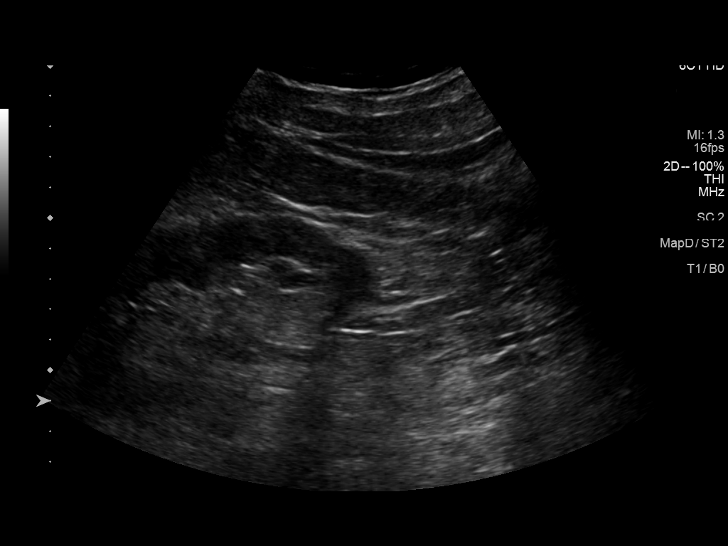
[im 16/30]
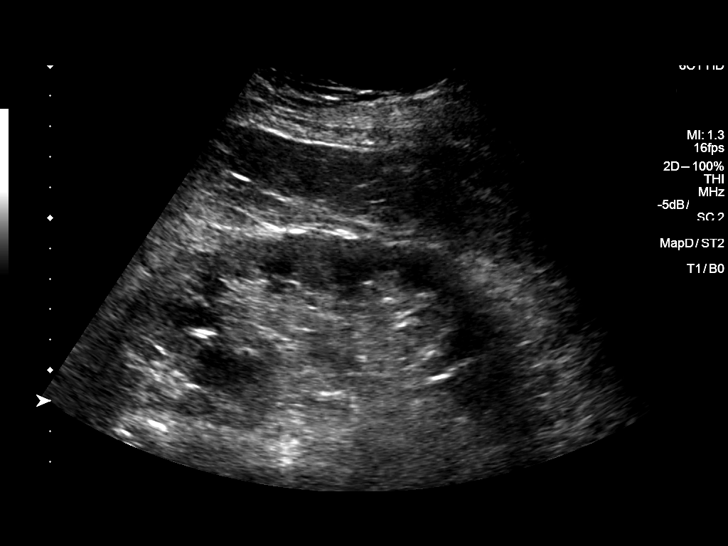
[im 19/30]
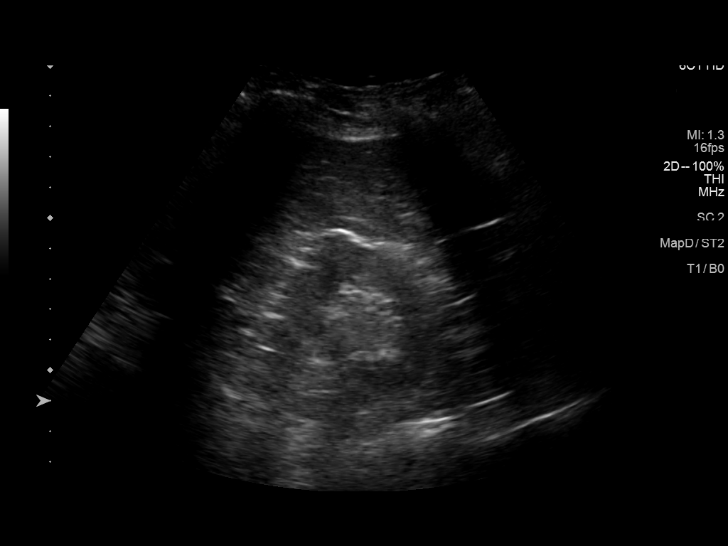
[im 20/30]
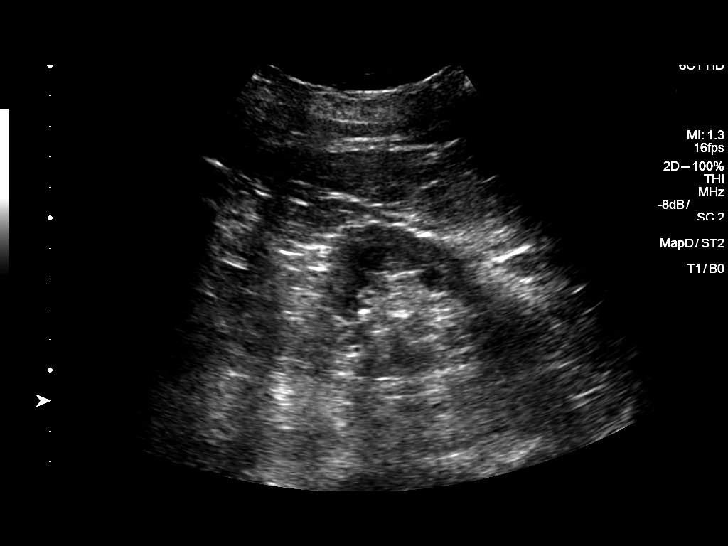
[im 22/30]
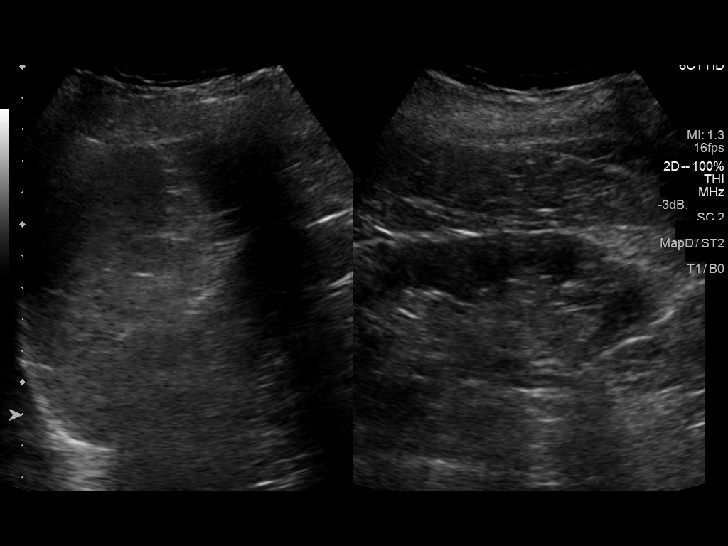
[im 25/30]
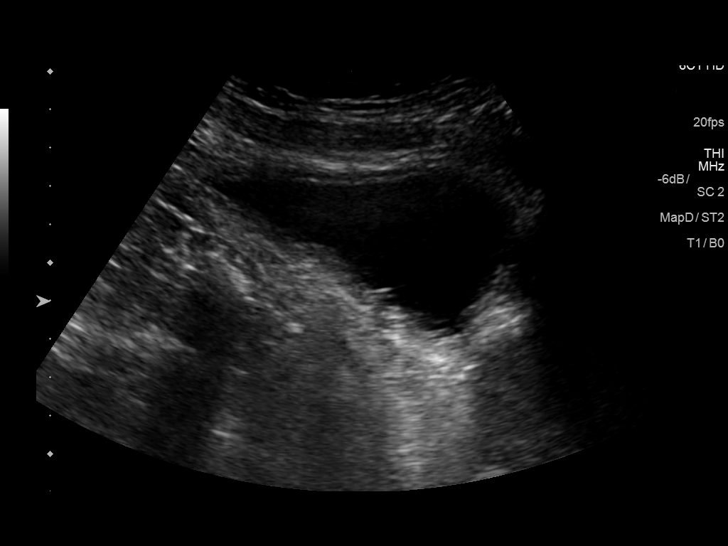
[im 27/30]
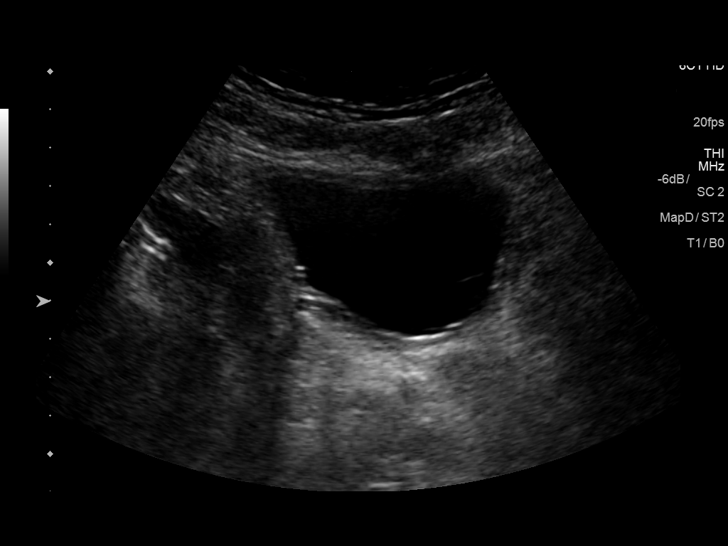
[im 30/30]
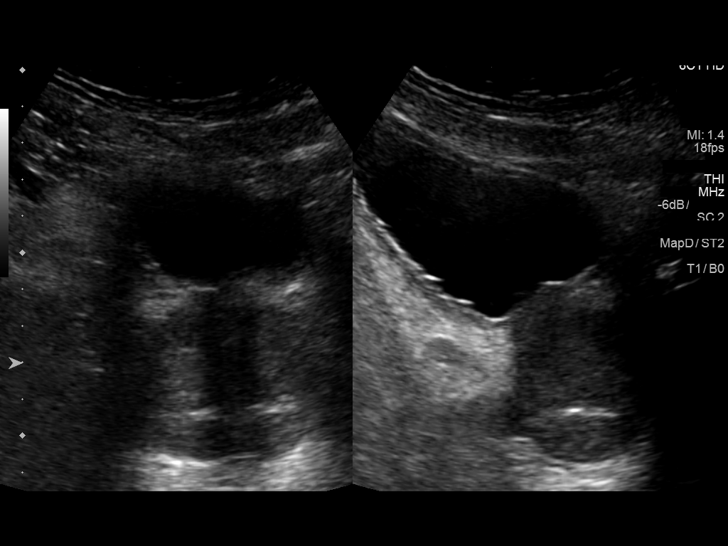

[14 of 25 positions shown; findings below may reference images not displayed]

FINDINGS: Right Kidney:

Length: 10.5 cm. Echogenicity within normal limits. No mass or
hydronephrosis visualized.

Left Kidney:

Length: 11.8 cm. Echogenicity within normal limits. No mass or
hydronephrosis visualized.

Bladder:

Appears normal for degree of bladder distention. Enlarged prostate
measuring 5.7 x 4.7 x 5.2 cm.
IMPRESSION: 1. Negative ultrasound appearance of the kidneys
2. Enlarged prostate

## 2018-12-15 ENCOUNTER — Telehealth: Payer: Self-pay | Admitting: *Deleted

## 2018-12-15 NOTE — Telephone Encounter (Signed)
Patient phone is disconnected.

## 2018-12-17 ENCOUNTER — Ambulatory Visit (INDEPENDENT_AMBULATORY_CARE_PROVIDER_SITE_OTHER): Payer: Medicare Other | Admitting: Internal Medicine

## 2018-12-17 ENCOUNTER — Encounter: Payer: Self-pay | Admitting: Internal Medicine

## 2018-12-17 ENCOUNTER — Other Ambulatory Visit: Payer: Self-pay

## 2018-12-17 VITALS — BP 126/51 | HR 70 | Temp 98.2°F | Wt 201.9 lb

## 2018-12-17 DIAGNOSIS — Z7982 Long term (current) use of aspirin: Secondary | ICD-10-CM | POA: Diagnosis not present

## 2018-12-17 DIAGNOSIS — E1122 Type 2 diabetes mellitus with diabetic chronic kidney disease: Secondary | ICD-10-CM | POA: Diagnosis not present

## 2018-12-17 DIAGNOSIS — G4733 Obstructive sleep apnea (adult) (pediatric): Secondary | ICD-10-CM | POA: Diagnosis not present

## 2018-12-17 DIAGNOSIS — Z87891 Personal history of nicotine dependence: Secondary | ICD-10-CM | POA: Diagnosis not present

## 2018-12-17 DIAGNOSIS — I129 Hypertensive chronic kidney disease with stage 1 through stage 4 chronic kidney disease, or unspecified chronic kidney disease: Secondary | ICD-10-CM

## 2018-12-17 DIAGNOSIS — Z79899 Other long term (current) drug therapy: Secondary | ICD-10-CM

## 2018-12-17 DIAGNOSIS — E1121 Type 2 diabetes mellitus with diabetic nephropathy: Secondary | ICD-10-CM

## 2018-12-17 DIAGNOSIS — N183 Chronic kidney disease, stage 3 unspecified: Secondary | ICD-10-CM

## 2018-12-17 DIAGNOSIS — I70219 Atherosclerosis of native arteries of extremities with intermittent claudication, unspecified extremity: Secondary | ICD-10-CM | POA: Diagnosis not present

## 2018-12-17 DIAGNOSIS — I70213 Atherosclerosis of native arteries of extremities with intermittent claudication, bilateral legs: Secondary | ICD-10-CM

## 2018-12-17 DIAGNOSIS — Z794 Long term (current) use of insulin: Secondary | ICD-10-CM

## 2018-12-17 DIAGNOSIS — E1159 Type 2 diabetes mellitus with other circulatory complications: Secondary | ICD-10-CM

## 2018-12-17 DIAGNOSIS — I152 Hypertension secondary to endocrine disorders: Secondary | ICD-10-CM

## 2018-12-17 DIAGNOSIS — I272 Pulmonary hypertension, unspecified: Secondary | ICD-10-CM

## 2018-12-17 LAB — POCT GLYCOSYLATED HEMOGLOBIN (HGB A1C): Hemoglobin A1C: 6.2 % — AB (ref 4.0–5.6)

## 2018-12-17 LAB — GLUCOSE, CAPILLARY: Glucose-Capillary: 224 mg/dL — ABNORMAL HIGH (ref 70–99)

## 2018-12-17 MED ORDER — VICTOZA 18 MG/3ML ~~LOC~~ SOPN: PEN_INJECTOR | SUBCUTANEOUS | 3 refills | Status: DC

## 2018-12-17 NOTE — Assessment & Plan Note (Signed)
This problem is chronic and stable.  We are treating his underlying risk factors.  He is on pravastatin 40 mg, a moderate intensity statin, for primary prevention.  He would qualify for a high intensity statin as his 10-year cardiovascular risk is 50%.  PLAN : Discussed high intensity statin at his next appointment

## 2018-12-17 NOTE — Progress Notes (Signed)
Subjective:    Patient ID: Dylan [name], male    DOB: [birth year], 73 y.o.   MRN: [id number]  HPI  Dylan [name] is here for DM F/U. Please see the A&P for the status of the pt's chronic medical problems.  ROS : per ROS section and in problem oriented charting. All other systems are negative.  PMHx, Soc hx, and / or Fam hx : He continues to live with his daughter and grandson.  They now have a yard and he helps his grandson and though the yard.  He continues to walk his grandson's dog, Lemon, who likes to take walks but not on a leash.  She prefers to run.  Review of Systems  Constitutional: Positive for unexpected weight change. Negative for activity change and appetite change.  HENT: Negative for rhinorrhea.   Eyes: Negative for itching.  Respiratory:       Positive for dyspnea on exertion  Cardiovascular: Negative for chest pain and leg swelling.  Gastrointestinal: Negative for constipation.  Neurological: Negative for dizziness, light-headedness and headaches.  Psychiatric/Behavioral: Negative for sleep disturbance.       Objective:   Physical Exam Constitutional:      Appearance: Normal appearance. He is obese.  HENT:     Head: Normocephalic and atraumatic.     Right Ear: External ear normal.     Left Ear: External ear normal.     Nose: Nose normal. No congestion or rhinorrhea.  Eyes:     General: No scleral icterus.       Right eye: No discharge.        Left eye: No discharge.     Extraocular Movements: Extraocular movements intact.     Conjunctiva/sclera: Conjunctivae normal.  Neck:     Musculoskeletal: Normal range of motion.  Cardiovascular:     Rate and Rhythm: Normal rate and regular rhythm.     Heart sounds: No murmur.  Pulmonary:     Effort: Pulmonary effort is normal.     Breath sounds: Normal breath sounds.  Abdominal:     General: Bowel sounds are normal.     Comments: Obese  Musculoskeletal:     Right lower leg: No edema.     Left lower leg: No  edema.  Skin:    General: Skin is warm and dry.  Neurological:     General: No focal deficit present.     Mental Status: He is alert. Mental status is at baseline.  Psychiatric:        Mood and Affect: Mood normal.        Behavior: Behavior normal.        Thought Content: Thought content normal.        Judgment: Judgment normal.       Assessment & Plan:

## 2018-12-17 NOTE — Assessment & Plan Note (Addendum)
This problem is chronic and stable.  I reviewed his sleep study from 2019 which showed severe obstructive sleep apnea with hypoxia to 85%.  He has not used CPAP for quite some time and states he has used both a full facemask and a nose mask.  He does not like the sensation of not being able to breathe.  He does have pulmonary hypertension.  Likely due to untreated OSA.  He is willing to try this again and I am ordering auto CPAP.  I did not order oxygen as his nocturnal hypoxia should likely be treated by simply the CPAP.  We discussed using a mask which is only the nasal prongs and I included this in the DME order.  PLAN : Auto CPAP, nasal prong mask, 2-week download   unfortunately, the patient will have to start the process over again to get a CPAP as his sleep study is more than 75 year old and insurance will not accept.   He will need a new OV to discuss his sleep issues and a new diagnostic baseline sleep study. Once this has all been done, we will need:   -Rx for CPAP w/pressure setting, DX and Length of Need  -Sleep study  -OVN

## 2018-12-17 NOTE — Assessment & Plan Note (Signed)
This problem is chronic and uncontrolled as his sleep apnea is not treated.  I am working on getting his CPAP treated.  He uses oxygen as needed when he experiences dyspnea.  He requires about 2-3 times a week.  He also has a finger pulse ox and states that sometimes his O2 is as low as 85%.  We checked an O2 sat on room air today which was 94%.  PLAN : Continue oxygen by nasal cannula as needed Work on treating his OSA

## 2018-12-17 NOTE — Patient Instructions (Signed)
1. Your sugar and pressure are doing great! 2. I will mail your test results to Dr Justin Mend

## 2018-12-17 NOTE — Assessment & Plan Note (Signed)
This problem is chronic and stable and managed by Dr. Justin Mend.  He had been requested to go to the hemodialysis education courses and elected not to do it.  I am checking a renal panel today.  PLAN : Renal panel today

## 2018-12-17 NOTE — Assessment & Plan Note (Signed)
This problem is chronic and controlled.  This is the lowest blood pressure lisinopril.  He is on Demadex, eplerenone 50 twice daily, labetalol 400 twice daily, verapamil 360 daily, minoxidil 20 daily, and hydralazine 100 TID.  His blood pressure is also responding to his weight loss, 15 pounds since March, and the liraglutide and has increased exercise.  He is pleased with his progress.  PLAN:  Cont current meds   BP Readings from Last 3 Encounters:  12/17/18 (!) 126/51  09/17/18 (!) 147/67  06/18/18 135/64

## 2018-12-17 NOTE — Assessment & Plan Note (Addendum)
This problem is chronic and stable.  His A1c trend has been 6.0 - 6.3 - 6.2.  He was switched from glipizide to liraglutide at his last appointment and was recently increased to 1.8 May 20 due to hypoglycemia.  He is having no side effects to this medication.  He is checking his glucose once a day fasting.  His sugar is within range about 70% of the time, high may be 25% of the time, and very high may be 2 or 3%.  He says occasionally he may feel like it slow but there are no documented hypoglycemia episodes on his log.  He states that his sugar is less than 130, he will not take the liraglutide.  He feels the liraglutide is also decreased his BP and he has also lost 15 pounds since March.  We discussed that liraglutide is unlikely to cause hypoglycemia and that he could take it even if his sugar was less than 130 but really, overall he is doing well and we ended up saying he could continue his current regimen.  As he continues to exercise and lose weight, that will also help his overall sugar control.  Assessment : Excellent control of his type 2 diabetes  Plan : Continue liraglutide 1.8 daily

## 2018-12-18 ENCOUNTER — Encounter: Payer: Self-pay | Admitting: Internal Medicine

## 2018-12-18 LAB — CBC
Hematocrit: 38 % (ref 37.5–51.0)
Hemoglobin: 12.8 g/dL — ABNORMAL LOW (ref 13.0–17.7)
MCH: 31.4 pg (ref 26.6–33.0)
MCHC: 33.7 g/dL (ref 31.5–35.7)
MCV: 93 fL (ref 79–97)
Platelets: 138 10*3/uL — ABNORMAL LOW (ref 150–450)
RBC: 4.07 x10E6/uL — ABNORMAL LOW (ref 4.14–5.80)
RDW: 12.1 % (ref 11.6–15.4)
WBC: 7 10*3/uL (ref 3.4–10.8)

## 2018-12-18 LAB — RENAL FUNCTION PANEL
Albumin: 4.6 g/dL (ref 3.7–4.7)
BUN/Creatinine Ratio: 14 (ref 10–24)
BUN: 29 mg/dL — ABNORMAL HIGH (ref 8–27)
CO2: 19 mmol/L — ABNORMAL LOW (ref 20–29)
Calcium: 9 mg/dL (ref 8.6–10.2)
Chloride: 104 mmol/L (ref 96–106)
Creatinine, Ser: 2.01 mg/dL — ABNORMAL HIGH (ref 0.76–1.27)
GFR calc Af Amer: 37 mL/min/{1.73_m2} — ABNORMAL LOW (ref >59)
GFR calc non Af Amer: 32 mL/min/{1.73_m2} — ABNORMAL LOW (ref >59)
Glucose: 211 mg/dL — ABNORMAL HIGH (ref 65–99)
Phosphorus: 3.3 mg/dL (ref 2.8–4.1)
Potassium: 4.2 mmol/L (ref 3.5–5.2)
Sodium: 139 mmol/L (ref 134–144)

## 2018-12-22 ENCOUNTER — Telehealth: Payer: Self-pay | Admitting: *Deleted

## 2018-12-22 ENCOUNTER — Other Ambulatory Visit: Payer: Self-pay | Admitting: *Deleted

## 2018-12-22 NOTE — Telephone Encounter (Signed)
Order place by pcp for home auto cpap titration  on 6/11- attempted to contact pt to see which company had previously supplied his cpap machine-no answer, unable to leave message.   Will send community message to [employer] to assist with request.Twilla Khouri Cassady6/16/20201:52 PM

## 2018-12-23 NOTE — Telephone Encounter (Signed)
Thanks Will discuss in sept

## 2018-12-23 NOTE — Telephone Encounter (Signed)
RE: auto titration order Received: Yesterday Message Contents  Horald Chestnut, CMA; Juluis Mire, unfortunately, the patient will have to start the process over again to get a CPAP as his sleep study is more than 75 year old and insurance will not accept.   He will need a new OV to discuss his sleep issues and a new diagnostic baseline sleep study. Once this has all been done, we will need:   -Rx for CPAP w/pressure setting, DX and Length of Need  -Sleep study  -OVN   Thank you.   Previous Messages  ----- Message -----  From: Marcelino Duster, CMA  Sent: 12/22/2018  1:55 PM EDT  To: Elon Alas  Subject: auto titration order               Good Audelia Hives,  Can you please take a look at the order for auto titration (cpap). Its listed under DME other. Is this something you can assist with and is the order in correctly? Thanks   Oluw[place]iwa Kinyon "Ulis Rias" Fries  [phone] or [phone]

## 2019-01-13 ENCOUNTER — Other Ambulatory Visit: Payer: Self-pay | Admitting: Internal Medicine

## 2019-01-16 ENCOUNTER — Other Ambulatory Visit: Payer: Self-pay | Admitting: Internal Medicine

## 2019-01-18 ENCOUNTER — Other Ambulatory Visit: Payer: Self-pay | Admitting: Internal Medicine

## 2019-01-18 NOTE — Telephone Encounter (Signed)
Next appt scheduled 9/10 with PCP.

## 2019-01-18 NOTE — Telephone Encounter (Signed)
Needs refill on verapamil (VERELAN PM) 360 MG 24 hr capsule [facility] [address], [place] ;pt contact [phone]

## 2019-01-20 ENCOUNTER — Telehealth: Payer: Self-pay

## 2019-01-20 ENCOUNTER — Emergency Department (HOSPITAL_COMMUNITY)
Admission: EM | Admit: 2019-01-20 | Discharge: 2019-01-20 | Disposition: A | Discharge status: Home / Self Care | Payer: Medicare Other | Attending: Emergency Medicine | Admitting: Emergency Medicine

## 2019-01-20 ENCOUNTER — Other Ambulatory Visit: Payer: Self-pay

## 2019-01-20 ENCOUNTER — Encounter (HOSPITAL_COMMUNITY): Payer: Self-pay | Admitting: Emergency Medicine

## 2019-01-20 DIAGNOSIS — N183 Chronic kidney disease, stage 3 (moderate): Secondary | ICD-10-CM | POA: Diagnosis not present

## 2019-01-20 DIAGNOSIS — Z79899 Other long term (current) drug therapy: Secondary | ICD-10-CM | POA: Diagnosis not present

## 2019-01-20 DIAGNOSIS — Z87891 Personal history of nicotine dependence: Secondary | ICD-10-CM | POA: Diagnosis not present

## 2019-01-20 DIAGNOSIS — Z7984 Long term (current) use of oral hypoglycemic drugs: Secondary | ICD-10-CM | POA: Diagnosis not present

## 2019-01-20 DIAGNOSIS — Z7982 Long term (current) use of aspirin: Secondary | ICD-10-CM | POA: Insufficient documentation

## 2019-01-20 DIAGNOSIS — K625 Hemorrhage of anus and rectum: Secondary | ICD-10-CM | POA: Diagnosis not present

## 2019-01-20 DIAGNOSIS — I130 Hypertensive heart and chronic kidney disease with heart failure and stage 1 through stage 4 chronic kidney disease, or unspecified chronic kidney disease: Secondary | ICD-10-CM | POA: Diagnosis not present

## 2019-01-20 DIAGNOSIS — I2510 Atherosclerotic heart disease of native coronary artery without angina pectoris: Secondary | ICD-10-CM | POA: Diagnosis not present

## 2019-01-20 DIAGNOSIS — E1122 Type 2 diabetes mellitus with diabetic chronic kidney disease: Secondary | ICD-10-CM | POA: Insufficient documentation

## 2019-01-20 DIAGNOSIS — I5032 Chronic diastolic (congestive) heart failure: Secondary | ICD-10-CM | POA: Diagnosis not present

## 2019-01-20 DIAGNOSIS — R140 Abdominal distension (gaseous): Secondary | ICD-10-CM | POA: Insufficient documentation

## 2019-01-20 LAB — CBC
HCT: 37.5 % — ABNORMAL LOW (ref 39.0–52.0)
Hemoglobin: 12.3 g/dL — ABNORMAL LOW (ref 13.0–17.0)
MCH: 32.1 pg (ref 26.0–34.0)
MCHC: 32.8 g/dL (ref 30.0–36.0)
MCV: 97.9 fL (ref 80.0–100.0)
Platelets: 132 10*3/uL — ABNORMAL LOW (ref 150–400)
RBC: 3.83 MIL/uL — ABNORMAL LOW (ref 4.22–5.81)
RDW: 12.7 % (ref 11.5–15.5)
WBC: 7.8 10*3/uL (ref 4.0–10.5)
nRBC: 0 % (ref 0.0–0.2)

## 2019-01-20 LAB — COMPREHENSIVE METABOLIC PANEL
ALT: 15 U/L (ref 0–44)
AST: 17 U/L (ref 15–41)
Albumin: 4 g/dL (ref 3.5–5.0)
Alkaline Phosphatase: 45 U/L (ref 38–126)
Anion gap: 7 (ref 5–15)
BUN: 22 mg/dL (ref 8–23)
CO2: 23 mmol/L (ref 22–32)
Calcium: 8.9 mg/dL (ref 8.9–10.3)
Chloride: 111 mmol/L (ref 98–111)
Creatinine, Ser: 1.85 mg/dL — ABNORMAL HIGH (ref 0.61–1.24)
GFR calc Af Amer: 41 mL/min — ABNORMAL LOW (ref >60)
GFR calc non Af Amer: 35 mL/min — ABNORMAL LOW (ref >60)
Glucose, Bld: 180 mg/dL — ABNORMAL HIGH (ref 70–99)
Potassium: 4 mmol/L (ref 3.5–5.1)
Sodium: 141 mmol/L (ref 135–145)
Total Bilirubin: 0.7 mg/dL (ref 0.3–1.2)
Total Protein: 7.6 g/dL (ref 6.5–8.1)

## 2019-01-20 LAB — TYPE AND SCREEN
ABO/RH(D): O POS
Antibody Screen: NEGATIVE

## 2019-01-20 LAB — ABO/RH: ABO/RH(D): O POS

## 2019-01-20 NOTE — Telephone Encounter (Signed)
Agree  Thank you

## 2019-01-20 NOTE — ED Triage Notes (Signed)
Pt reports abdominal bloating for 3 weeks but this morning he has had 2 episodes of dark red blood in his stool.

## 2019-01-20 NOTE — Telephone Encounter (Signed)
Requesting to speak with a nurse about blood coming from the rectum area. Please call pt back.

## 2019-01-20 NOTE — ED Notes (Signed)
Patient verbalizes understanding of discharge instructions. Opportunity for questioning and answers were provided. Armband removed by staff, pt discharged from ED.

## 2019-01-20 NOTE — Telephone Encounter (Signed)
Returned call to patient. States there was "a lot of blood in the toilet" following BM x 2 today. States stool is dark and also has BRB on it. Appetite is normal, denies N/V, but states, "stomach feels bloated." Patient with hx of diverticulitis. Also taking daily aspirin 81 mg. There are no open appts in [facility] today. Patient advised to head directly to ED. States he will. Hubbard Hartshorn, RN, BSN

## 2019-01-21 ENCOUNTER — Telehealth: Payer: Self-pay | Admitting: Internal Medicine

## 2019-01-21 ENCOUNTER — Other Ambulatory Visit: Payer: Self-pay

## 2019-01-21 ENCOUNTER — Ambulatory Visit (INDEPENDENT_AMBULATORY_CARE_PROVIDER_SITE_OTHER): Payer: Medicare Other | Admitting: Internal Medicine

## 2019-01-21 VITALS — BP 138/59 | HR 73 | Temp 98.7°F | Wt 201.6 lb

## 2019-01-21 DIAGNOSIS — E1159 Type 2 diabetes mellitus with other circulatory complications: Secondary | ICD-10-CM | POA: Diagnosis not present

## 2019-01-21 DIAGNOSIS — I152 Hypertension secondary to endocrine disorders: Secondary | ICD-10-CM

## 2019-01-21 DIAGNOSIS — I10 Essential (primary) hypertension: Secondary | ICD-10-CM | POA: Diagnosis not present

## 2019-01-21 DIAGNOSIS — K625 Hemorrhage of anus and rectum: Secondary | ICD-10-CM | POA: Insufficient documentation

## 2019-01-21 DIAGNOSIS — K5790 Diverticulosis of intestine, part unspecified, without perforation or abscess without bleeding: Secondary | ICD-10-CM | POA: Diagnosis not present

## 2019-01-21 DIAGNOSIS — I70213 Atherosclerosis of native arteries of extremities with intermittent claudication, bilateral legs: Secondary | ICD-10-CM

## 2019-01-21 NOTE — Assessment & Plan Note (Addendum)
Please see the note under rectal bleeding in problem list.

## 2019-01-21 NOTE — Patient Instructions (Addendum)
It was our pleasure taking care of you in our clinic today.  You were seen due to rectal bleeding. You need to be seen by your GI doctor probably for possible colonoscopy. Please call our office if you did not get any any call from GI doctor office next week. Your vital signs are stable. You have chronic anemia but your hemoglobin level that checked yesterday at emergency room is stable Please take your medications as before and contact us if you have any question or concern.  Please come back to clinic for follow up in 2 weeks or earlier as needed.  As always, if having severe bleeding, abdominal pain, nausea or vomiting or dizziness, please seek medical attention at emergency room.  Thanks, Dr. Linna Hoff

## 2019-01-21 NOTE — Progress Notes (Signed)
CC: Rectal bleeding  HPI:  [name] is a 73 y.o. with PMHx as documented below, presented with rectal bleeding that this started yesterday.  He called [facility] yesterday and was recommended to be seen in emergency department.  He was discharged he was seen in emergency department  please refer to problem based charting for further details and assessment of plan of current problem and chronic medical conditions.   Past Medical History:  Diagnosis Date  . Anemia 10/18/2014   Baseline about 12 and stable from 2010 to 2016. Colon 2009 in [place] (records cannot be obtained).  EGD Dr Benson Norway 2011 nl   . Chronic diastolic heart failure ([place]) 10/18/2014   Noted ECHO 10/2014. Grade 2. EF 50-55%   . Chronic renal failure, stage 3 (moderate) ([name]) 10/18/2014   Baseline Cr about 1.5. Stable from 2010 to 2016.   Marland Kitchen Coronary artery disease   . Diverticulosis 10/08/2014   Seen on CT. Reportedly on Colon in [place] in 2009. Freq bouts of diverticulitis.  . DM (diabetes mellitus), type 2 with renal complications ([place]) 1/0/1751  . Essential hypertension 10/09/2014   Poor control with 5 drug therapy.   . Former tobacco use 10/08/2014  . Gout   . OSA on CPAP 10/08/2014  . Pulmonary HTN ([place]) 10/18/2014   Noted as severe ECHO 2016. Likely 2/2 severe OSA.  Social exam: Quit smoking and alcohol about 25 years ago Denies illicit drug use  Family history: Mother passed away around age 58 due to colon cancer Sister with breast cancer  Review of Systems:   Review of Systems  Constitutional: Negative for chills, fever, malaise/fatigue and weight loss.  Gastrointestinal: Positive for blood in stool. Negative for abdominal pain, nausea and vomiting.  Genitourinary: Negative for dysuria.  Neurological: Negative for dizziness and headaches.    Physical Exam:  Vitals:   01/21/19 1013  BP: (!) 138/59  Pulse: 73  Temp: 98.7 F (37.1 C)  TempSrc: Oral  SpO2: 100%  Weight: 201 lb 9.6 oz (91.4 kg)   Physical Exam  Vitals signs and nursing note reviewed. Chaperone present: On 2 li supplemental O2.  Constitutional:      General: He is not in acute distress.    Appearance: Normal appearance. He is not ill-appearing.  HENT:     Mouth/Throat:     Mouth: Mucous membranes are moist.  Eyes:     Extraocular Movements: Extraocular movements intact.     Conjunctiva/sclera: Conjunctivae normal.  Cardiovascular:     Rate and Rhythm: Normal rate and regular rhythm.     Pulses: Normal pulses.     Heart sounds: Normal heart sounds. No murmur.  Pulmonary:     Effort: Pulmonary effort is normal. No respiratory distress.     Breath sounds: No wheezing or rales.  Abdominal:     General: Bowel sounds are normal.     Palpations: Abdomen is soft.     Tenderness: There is no abdominal tenderness. There is no guarding.  Genitourinary:    Rectum: Normal.     Comments: Patient tolerated rectal exam performed with presence of chaperone.  No external or internal hemorrhoids on rectal exam.  Exam consistent with bright blood with stool. Musculoskeletal:        General: No swelling.     Right lower leg: No edema.     Left lower leg: No edema.  Skin:    General: Skin is warm.     Coloration: Skin is not pale.  Neurological:     General: No focal deficit present.     Mental Status: He is alert and oriented to person, place, and time.  Psychiatric:        Mood and Affect: Mood normal.        Behavior: Behavior normal.        Thought Content: Thought content normal.        Judgment: Judgment normal.     Assessment & Plan:   See Encounters Tab for problem based charting.  Patient discussed with Dr. Evette Doffing

## 2019-01-21 NOTE — Assessment & Plan Note (Signed)
BP stable at 138/59 today. -Continue current medications

## 2019-01-21 NOTE — Telephone Encounter (Addendum)
Reason for call:   I received a call from [name] at 8:49 AM indicating that he has been bleeding from his rectum.   Pertinent Data:   Martin Majestic to ED yesterday 01/20/19 for 1 day history of rectal bleeding. His hb is 12.3 (baseline hb approximately 13). No significant changes in electrolytes. ED provider note has not been placed so do not know what was done.   Had abdominal bloating and "lots of gas" for the past 3 weeks.   Denies pain with defecation,  Abdominal pain, nausea/vomiting, fevers/chills, changes in appetite  Last colonoscopy done by Dr. 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 Patient was told he needed a repeat colonoscopy in 5 years.     Assessment / Plan / Recommendations:   Patient has a lower GI bleed that should be evaluated. Rectal exam needs to be done and patient should be scheduled follow-up with Dr. Benson Norway.   Advised patient to come to [facility] this morning. Contacted front desk staff to schedule an acc appointment for patient today 7/16.  As always, pt is advised that if symptoms worsen or new symptoms arise, they should go to an urgent care facility or to to ER for further evaluation.   Lars Mage, MD   01/21/2019, 8:49 AM

## 2019-01-21 NOTE — Assessment & Plan Note (Signed)
Mr. Vana presented to clinic today due to rectal bleeding. He noticed dark red blood (separate from stool) since yesterday.  He denies any pain during defecation.  No nausea vomiting abdominal pain.  He has had constipation sometimes it did not get worse recently.  He denies any history of hemorrhoid.  He reports good appetite and denies any weight loss.  He was recommended to go to ED yesterday after calling [facility].  Blood work showed stable chronic anemia with hemoglobin of 12.5.  And normal electrolyte. He mentions that his last rectal bleeding but this morning.  He is doing well otherwise.  Denies any headache, dizziness.  No shortness of breath more than his baseline. (He is on 2 L supplemental oxygen at home in setting of pulmonary hypertension/HF) Rectal exam without any external or internal hemorrhoid.  Obvious blood with DRE. -He has not a colonoscopy 5 years ago by Dr. Almyra Free show tubular adenoma high-grade dysplasia. He also has history of diverticulosis that can explain his pain less rectal bleeding however colon cancer is on differential. (He also has family history of colon cancer in mother) He is hemodynamically stable. Orthostatic vital is negative. Will send home to follow up with GI doctor ASAP. I talked to Mr. Borak daughter over the phone. She is concerns and asks about when Dr. Lynnae January will see the patient. Also concern about the bleeding. All of their questions and concerns were addressed,. Explained that he is stable and does not need admission and the next step will be being evaluated by GI doctor for possible colonoscopy. I sent a GI referral for ASAP appointment and our RN called their office. Patient will need to call GI office as well. Pt and his daughter were informed.    All all ED return precautions provided.  -Refer to Dr. Benson Norway for colonoscopy/sigmoidoscopy -Call [facility] if not able to get an gi appointment next week -F/u in [facility] in 2 weeks or sooner as needed. Return to ED for  sever symptoms

## 2019-01-22 NOTE — Progress Notes (Signed)
Internal Medicine Clinic Attending  Case discussed with Dr. Myrtie Hawk  at the time of the visit.  We reviewed the resident's history and exam and pertinent patient test results.  I agree with the assessment, diagnosis, and plan of care documented in the resident's note.

## 2019-01-25 DIAGNOSIS — Z86010 Personal history of colonic polyps: Secondary | ICD-10-CM | POA: Diagnosis not present

## 2019-01-25 DIAGNOSIS — K5730 Diverticulosis of large intestine without perforation or abscess without bleeding: Secondary | ICD-10-CM | POA: Diagnosis not present

## 2019-01-25 DIAGNOSIS — R195 Other fecal abnormalities: Secondary | ICD-10-CM | POA: Diagnosis not present

## 2019-01-25 DIAGNOSIS — K625 Hemorrhage of anus and rectum: Secondary | ICD-10-CM | POA: Diagnosis not present

## 2019-01-28 NOTE — ED Provider Notes (Signed)
[facility] EMERGENCY DEPARTMENT Provider Note   CSN: [id number] Arrival date & time: 01/20/19  1417     History   Chief Complaint Chief Complaint  Patient presents with  . GI Bleeding    HPI Dylan [name] is a 73 y.o. male.     HPI   73 year old male with rectal bleeding.  2 episodes of dark red blood mixed in with the stool today.  He says abdominal bloating over the past couple weeks but no discrete pain.  No rectal pain.  No dizziness, lightheadedness or dyspnea.  He is on aspirin.  No overt bleeding elsewhere.  Past Medical History:  Diagnosis Date  . Anemia 10/18/2014   Baseline about 12 and stable from 2010 to 2016. Colon 2009 in [place] (records cannot be obtained).  EGD Dr Benson Norway 2011 nl   . Chronic diastolic heart failure ([place]) 10/18/2014   Noted ECHO 10/2014. Grade 2. EF 50-55%   . Chronic renal failure, stage 3 (moderate) ([name]) 10/18/2014   Baseline Cr about 1.5. Stable from 2010 to 2016.   Marland Kitchen Coronary artery disease   . Diverticulosis 10/08/2014   Seen on CT. Reportedly on Colon in [place] in 2009. Freq bouts of diverticulitis.  . DM (diabetes mellitus), type 2 with renal complications ([place]) 01/06/946  . Essential hypertension 10/09/2014   Poor control with 5 drug therapy.   . Former tobacco use 10/08/2014  . Gout   . OSA on CPAP 10/08/2014  . Pulmonary HTN ([place]) 10/18/2014   Noted as severe ECHO 2016. Likely 2/2 severe OSA.    Patient Active Problem List   Diagnosis Date Noted  . Rectal bleeding 01/21/2019  . Coronary artery disease 04/04/2018  . Chronic pancreatitis ([place]) 11/10/2017  . Thrombocytopenia ([place]) 10/09/2016  . Allergic rhinitis 06/15/2015  . 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 Atherosclerosis 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 ([place]) 12/28/2014  . Healthcare maintenance 12/12/2014  . Gout 10/19/2014  . Stage 3 chronic renal impairment associated with type 2 diabetes mellitus ([place]) 10/18/2014  . Anemia 10/18/2014  .  Obesity 10/18/2014  . Pulmonary HTN ([place]) 10/18/2014  . Chronic diastolic heart failure ([place]) 10/18/2014  . Hypertension associated with diabetes ([place]) 10/09/2014  . OSA (obstructive sleep apnea) 10/08/2014  . Former tobacco use 10/08/2014  . Diverticulosis 10/08/2014  . DM (diabetes mellitus), type 2 with renal complications ([place]) 09/62/8366    Past Surgical History:  Procedure Laterality Date  . CHOLECYSTECTOMY          Home Medications    Prior to Admission medications   Medication Sig Start Date End Date Taking? Authorizing Provider  allopurinol (ZYLOPRIM) 100 MG tablet TAKE 1 TABLET BY MOUTH ONCE DAILY Patient taking differently: Take 100 mg by mouth daily.  07/30/18  Yes Bartholomew Crews, MD  aspirin (BAYER LOW DOSE) 81 MG EC tablet Take 81 mg by mouth daily. Swallow whole.   Yes [provider]  colchicine 0.6 MG tablet Take one pill by mouth daily for an acute gout flare until pain is gone. Patient taking differently: Take 0.6 mg by mouth See admin instructions. Take 0.6 mg by mouth once a day as directed for an acute gout flare until pain is gone 08/27/18  Yes Bartholomew Crews, MD  eplerenone (INSPRA) 50 MG tablet Take 1 tablet (50 mg total) by mouth 2 (two) times daily. 11/04/18  Yes Aldine Contes, MD  hydrALAZINE (APRESOLINE) 50 MG tablet TAKE 2 TABLETS BY MOUTH THREE  TIMES DAILY Patient taking differently: Take 100 mg by mouth 3 (three) times daily.  06/17/18  Yes Bartholomew Crews, MD  ketotifen (ZADITOR) 0.025 % ophthalmic solution Place 1 drop into both eyes 2 (two) times daily.   Yes [provider]  labetalol (NORMODYNE) 200 MG tablet Take 2 tablets by mouth twice daily Patient taking differently: Take 400 mg by mouth 2 (two) times daily.  11/23/18  Yes Bartholomew Crews, MD  liraglutide (VICTOZA) 18 MG/3ML SOPN Inject 1.2 subcutaneously for every day 12/17/18  Yes Bartholomew Crews, MD  minoxidil (LONITEN) 10 MG tablet Take 2  tablets by mouth once daily Patient taking differently: Take 10 mg by mouth 2 (two) times daily.  11/11/18  Yes Bartholomew Crews, MD  polyethylene glycol [facility] / GLYCOLAX) packet Take 17 g by mouth 3 (three) times a week.    Yes [provider]  torsemide (DEMADEX) 10 MG tablet Take 1 tablet in morning and 1 tablet in the evening. If your weight is up by 3lbs in 1 day, take another dose Patient taking differently: Take 10 mg by mouth See admin instructions. Take 10 mg by mouth in the morning, 10 mg in the evening, and additional 10 mg if weight increases by 3 lbs in one day 06/22/18  Yes Oval Linsey, MD  verapamil (VERELAN PM) 360 MG 24 hr capsule Take 1 capsule by mouth once daily Patient taking differently: Take 360 mg by mouth daily.  01/18/19  Yes Bartholomew Crews, MD  albuterol (PROVENTIL HFA;VENTOLIN HFA) 108 (90 Base) MCG/ACT inhaler Inhale 1-2 puffs into the lungs every 6 (six) hours as needed for wheezing or shortness of breath. Patient not taking: Reported on 01/20/2019 10/30/17   Bartholomew Crews, MD  aspirin 81 MG chewable tablet Chew 1 tablet (81 mg total) by mouth daily. Patient not taking: Reported on 01/20/2019 06/15/15   Riccardo Dubin, MD  FREESTYLE LITE test strip USE 1 STRIP TO CHECK GLUCOSE ONCE DAILY WITH MEALS AND AT BEDTIME Patient taking differently: 1 each by Other route 4 (four) times daily - after meals and at bedtime.  02/19/18   Bartholomew Crews, MD  Insulin Pen Needle (PEN NEEDLES) 31G X 5 MM MISC Inject 1 Dose into the skin daily. With Victoza 10/08/18   Forde Dandy, PharmD  Potassium Chloride ER 20 MEQ TBCR Take 20 mEq by mouth daily. Patient not taking: Reported on 01/20/2019 12/25/17   Bartholomew Crews, MD  pravastatin (PRAVACHOL) 40 MG tablet Take 1 tablet (40 mg total) by mouth every evening. Patient not taking: Reported on 01/20/2019 10/30/17 01/20/19  Bartholomew Crews, MD    Family History Family History  Problem Relation Age  of Onset  . Heart failure Sister        Died of complications Nov 2633  . CAD Sister        CABG x3 while in her 16's    Social History Social History   Tobacco Use  . Smoking status: Former Smoker    Quit date: 10/02/1972    Years since quitting: 46.3  . Smokeless tobacco: Never Used  Substance Use Topics  . Alcohol use: No    Alcohol/week: 0.0 standard drinks  . Drug use: No     Allergies   Ace inhibitors and Spironolactone   Review of Systems Review of Systems  All systems reviewed and negative, other than as noted in HPI.  Physical Exam Updated Vital Signs BP 138/64  Pulse 65   Temp 98.2 F (36.8 C) (Oral)   Resp (!) 21   SpO2 97%   Physical Exam Vitals signs and nursing note reviewed.  Constitutional:      General: He is not in acute distress.    Appearance: He is well-developed.  HENT:     Head: Normocephalic and atraumatic.  Eyes:     General:        Right eye: No discharge.        Left eye: No discharge.     Conjunctiva/sclera: Conjunctivae normal.  Neck:     Musculoskeletal: Neck supple.  Cardiovascular:     Rate and Rhythm: Normal rate and regular rhythm.     Heart sounds: Normal heart sounds. No murmur. No friction rub. No gallop.   Pulmonary:     Effort: Pulmonary effort is normal. No respiratory distress.     Breath sounds: Normal breath sounds.  Abdominal:     General: There is no distension.     Palpations: Abdomen is soft.     Tenderness: There is no abdominal tenderness.  Musculoskeletal:        General: No tenderness.  Skin:    General: Skin is warm and dry.  Neurological:     Mental Status: He is alert.  Psychiatric:        Behavior: Behavior normal.        Thought Content: Thought content normal.      ED Treatments / Results  Labs (all labs ordered are listed, but only abnormal results are displayed) Labs Reviewed  COMPREHENSIVE METABOLIC PANEL - Abnormal; Notable for the following components:      Result Value    Glucose, Bld 180 (*)    Creatinine, Ser 1.85 (*)    GFR calc non Af Amer 35 (*)    GFR calc Af Amer 41 (*)    All other components within normal limits  CBC - Abnormal; Notable for the following components:   RBC 3.83 (*)    Hemoglobin 12.3 (*)    HCT 37.5 (*)    Platelets 132 (*)    All other components within normal limits  TYPE AND SCREEN  ABO/RH    EKG None  Radiology No results found.  Procedures Procedures (including critical care time)  Medications Ordered in ED Medications - No data to display   Initial Impression / Assessment and Plan / ED Course  I have reviewed the triage vital signs and the nursing notes.  Pertinent labs & imaging results that were available during my care of the patient were reviewed by me and considered in my medical decision making (see chart for details).       73 year old male with rectal bleeding.  I feel is appropriate outpatient work-up at this time.  Describes blood mixed in with the stool.  No episode since being the emergency room.  Denies any symptoms to suggest event blood loss anemia.  His hemoglobin is okay.  He is not anticoagulated.  His blood pressure is fine.  He is not tachycardic.  Return precautions were discussed.  Outpatient follow-up otherwise.  Final Clinical Impressions(s) / ED Diagnoses   Final diagnoses:  Rectal bleeding    ED Discharge Orders    None       Virgel Manifold, MD 01/28/19 [phone]

## 2019-02-02 ENCOUNTER — Other Ambulatory Visit: Payer: Self-pay | Admitting: Gastroenterology

## 2019-02-08 ENCOUNTER — Other Ambulatory Visit: Payer: Self-pay

## 2019-02-08 ENCOUNTER — Encounter (HOSPITAL_COMMUNITY): Payer: Self-pay | Admitting: *Deleted

## 2019-02-08 NOTE — Progress Notes (Signed)
Pt doesnot utilize CPAP machine. Reports that he uses O2 _0  at noc is 02 sat is less than 80%.

## 2019-02-09 ENCOUNTER — Other Ambulatory Visit (HOSPITAL_COMMUNITY)
Admission: RE | Admit: 2019-02-09 | Discharge: 2019-02-09 | Disposition: A | Discharge status: Home / Self Care | Payer: Medicare Other | Source: Ambulatory Visit | Attending: Gastroenterology | Admitting: Gastroenterology

## 2019-02-09 DIAGNOSIS — K5792 Diverticulitis of intestine, part unspecified, without perforation or abscess without bleeding: Secondary | ICD-10-CM | POA: Diagnosis not present

## 2019-02-09 DIAGNOSIS — Z01812 Encounter for preprocedural laboratory examination: Secondary | ICD-10-CM | POA: Insufficient documentation

## 2019-02-09 DIAGNOSIS — Z20828 Contact with and (suspected) exposure to other viral communicable diseases: Secondary | ICD-10-CM | POA: Diagnosis not present

## 2019-02-09 LAB — SARS CORONAVIRUS 2 (TAT 6-24 HRS): SARS Coronavirus 2: NEGATIVE

## 2019-02-12 ENCOUNTER — Ambulatory Visit (HOSPITAL_COMMUNITY): Payer: Medicare Other | Admitting: Registered Nurse

## 2019-02-12 ENCOUNTER — Encounter (HOSPITAL_COMMUNITY): Payer: Self-pay | Admitting: Registered Nurse

## 2019-02-12 ENCOUNTER — Encounter (HOSPITAL_COMMUNITY)
Admission: RE | Disposition: A | Discharge status: Home / Self Care | Payer: Self-pay | Source: Home / Self Care | Attending: Gastroenterology

## 2019-02-12 ENCOUNTER — Other Ambulatory Visit: Payer: Self-pay

## 2019-02-12 ENCOUNTER — Ambulatory Visit (HOSPITAL_COMMUNITY)
Admission: RE | Admit: 2019-02-12 | Discharge: 2019-02-12 | Disposition: A | Discharge status: Home / Self Care | Payer: Medicare Other | Attending: Gastroenterology | Admitting: Gastroenterology

## 2019-02-12 DIAGNOSIS — Z79899 Other long term (current) drug therapy: Secondary | ICD-10-CM | POA: Diagnosis not present

## 2019-02-12 DIAGNOSIS — J449 Chronic obstructive pulmonary disease, unspecified: Secondary | ICD-10-CM | POA: Insufficient documentation

## 2019-02-12 DIAGNOSIS — E1122 Type 2 diabetes mellitus with diabetic chronic kidney disease: Secondary | ICD-10-CM | POA: Insufficient documentation

## 2019-02-12 DIAGNOSIS — Z794 Long term (current) use of insulin: Secondary | ICD-10-CM | POA: Insufficient documentation

## 2019-02-12 DIAGNOSIS — K625 Hemorrhage of anus and rectum: Secondary | ICD-10-CM | POA: Diagnosis not present

## 2019-02-12 DIAGNOSIS — K921 Melena: Secondary | ICD-10-CM | POA: Diagnosis not present

## 2019-02-12 DIAGNOSIS — R195 Other fecal abnormalities: Secondary | ICD-10-CM | POA: Diagnosis not present

## 2019-02-12 DIAGNOSIS — Z87891 Personal history of nicotine dependence: Secondary | ICD-10-CM | POA: Diagnosis not present

## 2019-02-12 DIAGNOSIS — N183 Chronic kidney disease, stage 3 (moderate): Secondary | ICD-10-CM | POA: Insufficient documentation

## 2019-02-12 DIAGNOSIS — I272 Pulmonary hypertension, unspecified: Secondary | ICD-10-CM | POA: Insufficient documentation

## 2019-02-12 DIAGNOSIS — I5032 Chronic diastolic (congestive) heart failure: Secondary | ICD-10-CM | POA: Insufficient documentation

## 2019-02-12 DIAGNOSIS — K5730 Diverticulosis of large intestine without perforation or abscess without bleeding: Secondary | ICD-10-CM | POA: Diagnosis not present

## 2019-02-12 DIAGNOSIS — D649 Anemia, unspecified: Secondary | ICD-10-CM | POA: Diagnosis not present

## 2019-02-12 DIAGNOSIS — G4733 Obstructive sleep apnea (adult) (pediatric): Secondary | ICD-10-CM | POA: Diagnosis not present

## 2019-02-12 DIAGNOSIS — M109 Gout, unspecified: Secondary | ICD-10-CM | POA: Insufficient documentation

## 2019-02-12 DIAGNOSIS — Z86010 Personal history of colonic polyps: Secondary | ICD-10-CM | POA: Diagnosis not present

## 2019-02-12 DIAGNOSIS — I2510 Atherosclerotic heart disease of native coronary artery without angina pectoris: Secondary | ICD-10-CM | POA: Diagnosis not present

## 2019-02-12 DIAGNOSIS — I130 Hypertensive heart and chronic kidney disease with heart failure and stage 1 through stage 4 chronic kidney disease, or unspecified chronic kidney disease: Secondary | ICD-10-CM | POA: Insufficient documentation

## 2019-02-12 HISTORY — PX: COLONOSCOPY WITH PROPOFOL: SHX5780

## 2019-02-12 LAB — GLUCOSE, CAPILLARY: Glucose-Capillary: 124 mg/dL — ABNORMAL HIGH (ref 70–99)

## 2019-02-12 SURGERY — COLONOSCOPY WITH PROPOFOL
Anesthesia: Monitor Anesthesia Care

## 2019-02-12 MED ORDER — GLYCOPYRROLATE 0.2 MG/ML IJ SOLN
INTRAMUSCULAR | Status: DC | PRN
  Administered 2019-02-12: 0.1 mg via INTRAVENOUS

## 2019-02-12 MED ORDER — LACTATED RINGERS IV SOLN
INTRAVENOUS | Status: DC
  Administered 2019-02-12: 1000 mL via INTRAVENOUS

## 2019-02-12 MED ORDER — PROPOFOL 10 MG/ML IV BOLUS
INTRAVENOUS | Status: AC
  Filled 2019-02-12: qty 40

## 2019-02-12 MED ORDER — PROPOFOL 500 MG/50ML IV EMUL
INTRAVENOUS | Status: DC | PRN
  Administered 2019-02-12: 125 ug/kg/min via INTRAVENOUS

## 2019-02-12 MED ORDER — SODIUM CHLORIDE 0.9 % IV SOLN: INTRAVENOUS | Status: DC

## 2019-02-12 SURGICAL SUPPLY — 22 items

## 2019-02-12 NOTE — Transfer of Care (Signed)
Immediate Anesthesia Transfer of Care Note  Patient: Dylan [name]  Procedure(s) Performed: COLONOSCOPY WITH PROPOFOL (N/A )  Patient Location: PACU  Anesthesia Type:MAC  Level of Consciousness: awake, alert , oriented and patient cooperative  Airway & Oxygen Therapy: Patient Spontanous Breathing and Patient connected to face mask oxygen  Post-op Assessment: Report given to RN, Post -op Vital signs reviewed and stable and Patient moving all extremities X 4  Post vital signs: stable  Last Vitals:  Vitals Value Taken Time  BP 117/55 02/12/19 1046  Temp 36.9 C 02/12/19 1046  Pulse 64 02/12/19 1046  Resp 20 02/12/19 1046  SpO2 100 % 02/12/19 1046    Last Pain:  Vitals:   02/12/19 1046  TempSrc: Oral  PainSc: 0-No pain         Complications: No apparent anesthesia complications

## 2019-02-12 NOTE — Anesthesia Postprocedure Evaluation (Signed)
Anesthesia Post Note  Patient: Dylan [name]  Procedure(s) Performed: COLONOSCOPY WITH PROPOFOL (N/A )     Patient location during evaluation: PACU Anesthesia Type: MAC Level of consciousness: awake and alert Pain management: pain level controlled Vital Signs Assessment: post-procedure vital signs reviewed and stable Respiratory status: spontaneous breathing, nonlabored ventilation and respiratory function stable Cardiovascular status: stable and blood pressure returned to baseline Anesthetic complications: no    Last Vitals:  Vitals:   02/12/19 1050 02/12/19 1100  BP: 127/63 127/63  Pulse: 67 66  Resp: 20 20  Temp:    SpO2: 100% 95%    Last Pain:  Vitals:   02/12/19 1100  TempSrc:   PainSc: 0-No pain                 Audry Pili

## 2019-02-12 NOTE — H&P (Signed)
  [place] HPI: The patient complains about painless hematochezia last week and a rectal examination confirmed the blood. The bleeding started from Tuesday and it resolved yesterday. His latest HGB on 01/20/2019 was 12.3 g/dL. His colonoscopy on 12/07/2013 was positive for a cecal and ascending colon 3-4 mm adenoma. Ascending and sigmoid colon diverticula were identified. The patient denies any issues with chest pain or SOB.  Past Medical History:  Diagnosis Date  . Anemia 10/18/2014   Baseline about 12 and stable from 2010 to 2016. Colon 2009 in [place] (records cannot be obtained).  EGD Dr Benson Norway 2011 nl   . Chronic diastolic heart failure ([place]) 10/18/2014   Noted ECHO 10/2014. Grade 2. EF 50-55%   . Chronic renal failure, stage 3 (moderate) ([name]) 10/18/2014   Baseline Cr about 1.5. Stable from 2010 to 2016.   Marland Kitchen Coronary artery disease   . Diverticulosis 10/08/2014   Seen on CT. Reportedly on Colon in [place] in 2009. Freq bouts of diverticulitis.  . DM (diabetes mellitus), type 2 with renal complications ([place]) 01/10/7010  . Essential hypertension 10/09/2014   Poor control with 5 drug therapy.   . Former tobacco use 10/08/2014  . Gout   . OSA on CPAP 10/08/2014  . Pulmonary HTN ([place]) 10/18/2014   Noted as severe ECHO 2016. Likely 2/2 severe OSA.    Past Surgical History:  Procedure Laterality Date  . CHOLECYSTECTOMY      Family History  Problem Relation Age of Onset  . Heart failure Sister        Died of complications Nov 0034  . CAD Sister        CABG x3 while in her 35's    Social History:  reports that he quit smoking about 46 years ago. He has never used smokeless tobacco. He reports that he does not drink alcohol or use drugs.  Allergies:  Allergies  Allergen Reactions  . Ace Inhibitors Other (See Comments)    "ARF - see CRF overview"  . Spironolactone Other (See Comments)    Gynecomastia per pt report    Medications:  Scheduled:  Continuous: . sodium chloride    . lactated  ringers 1,000 mL (02/12/19 0908)    Results for orders placed or performed during the hospital encounter of 02/12/19 (from the past 24 hour(s))  Glucose, capillary     Status: Abnormal   Collection Time: 02/12/19  9:07 AM  Result Value Ref Range   Glucose-Capillary 124 (H) 70 - 99 mg/dL     No results found.  ROS:  As stated above in the HPI otherwise negative.  Blood pressure (!) 161/93, pulse 70, temperature 98.7 F (37.1 C), temperature source Oral, resp. rate 15, height _0  (1.676 m), weight 95.3 kg, SpO2 93 %.    PE: Gen: NAD, Alert and Oriented HEENT:  [place]/AT, EOMI Neck: Supple, no LAD Lungs: CTA Bilaterally CV: RRR without M/G/R ABM: Soft, NTND, +BS Ext: No C/C/E  Assessment/Plan: 1) Hematochezia - colonoscopy.  Rumeal Cullipher D 02/12/2019, 10:05 AM

## 2019-02-12 NOTE — Anesthesia Procedure Notes (Signed)
Procedure Name: MAC Date/Time: 02/12/2019 10:18 AM Performed by: Lissa Morales, CRNA Pre-anesthesia Checklist: Patient identified, Emergency Drugs available, Suction available, Patient being monitored and Timeout performed Patient Re-evaluated:Patient Re-evaluated prior to induction Oxygen Delivery Method: Simple face mask Placement Confirmation: positive ETCO2

## 2019-02-12 NOTE — Anesthesia Preprocedure Evaluation (Addendum)
Anesthesia Evaluation  Patient identified by MRN, date of birth, ID band Patient awake    Reviewed: Allergy & Precautions, NPO status , Patient's Chart, lab work & pertinent test results  History of Anesthesia Complications Negative for: history of anesthetic complications  Airway Mallampati: II  TM Distance: >3 FB Neck ROM: Full    Dental  (+) Teeth Intact   Pulmonary sleep apnea (noncompliant) , COPD, former smoker,    Pulmonary exam normal        Cardiovascular hypertension, Pt. on medications and Pt. on home beta blockers (-) angina+ CAD  Normal cardiovascular exam   Severe pulmonary hypertension  '19 TTE - Severe concentric hypertrophy. EF 60% to 65%. Grade 1 diastolic dysfunction. LA was severely dilated. RV size was mildly dilated. RA was severely dilated. Mild TR. PASP was severely increased. PA peak pressure: 65 mm Hg.  A moderate pericardial effusion was identified along the left ventricular free wall.    Neuro/Psych negative neurological ROS  negative psych ROS   GI/Hepatic negative GI ROS, Neg liver ROS,   Endo/Other  diabetes, Type 2  Renal/GU CRFRenal disease     Musculoskeletal negative musculoskeletal ROS (+)   Abdominal   Peds  Hematology  (+) anemia ,   Anesthesia Other Findings   Reproductive/Obstetrics                           Anesthesia Physical Anesthesia Plan  ASA: IV  Anesthesia Plan: MAC   Post-op Pain Management:    Induction: Intravenous  PONV Risk Score and Plan: 1 and Propofol infusion and Treatment may vary due to age or medical condition  Airway Management Planned: Nasal Cannula and Natural Airway  Additional Equipment: None  Intra-op Plan:   Post-operative Plan:   Informed Consent: I have reviewed the patients History and Physical, chart, labs and discussed the procedure including the risks, benefits and alternatives for the proposed  anesthesia with the patient or authorized representative who has indicated his/her understanding and acceptance.       Plan Discussed with: CRNA and Anesthesiologist  Anesthesia Plan Comments:       Anesthesia Quick Evaluation

## 2019-02-12 NOTE — Op Note (Signed)
[facility] Patient Name: Dylan [name] Procedure Date: 02/12/2019 MRN: [id number] Attending MD: Carol Ada , MD Date of Birth: 1945-09-23 CSN: [id number] Age: 73 Admit Type: Outpatient Procedure:                Colonoscopy Indications:              Hematochezia Providers:                Carol Ada, MD, Vista Lawman, RN, Marguerita Merles, Technician Referring MD:              Medicines:                Propofol per Anesthesia Complications:            No immediate complications. Estimated Blood Loss:     Estimated blood loss: none. Procedure:                Pre-Anesthesia Assessment:                           - Prior to the procedure, a History and Physical                            was performed, and patient medications and                            allergies were reviewed. The patient's tolerance of                            previous anesthesia was also reviewed. The risks                            and benefits of the procedure and the sedation                            options and risks were discussed with the patient.                            All questions were answered, and informed consent                            was obtained. Prior Anticoagulants: The patient has                            taken no previous anticoagulant or antiplatelet                            agents. ASA Grade Assessment: IV - A patient with                            severe systemic disease that is a constant threat                            to life. After  reviewing the risks and benefits,                            the patient was deemed in satisfactory condition to                            undergo the procedure.                           - Sedation was administered by an anesthesia                            professional. Deep sedation was attained.                           After obtaining informed consent, the colonoscope    was passed under direct vision. Throughout the                            procedure, the patient's blood pressure, pulse, and                            oxygen saturations were monitored continuously. The                            CF-HQ190L ([id number] Olympus colonoscope was                            introduced through the anus and advanced to the the                            cecum, identified by appendiceal orifice and                            ileocecal valve. The colonoscopy was performed                            without difficulty. The patient tolerated the                            procedure well. The quality of the bowel                            preparation was good. The ileocecal valve,                            appendiceal orifice, and rectum were photographed. Scope In: 10:26:37 AM Scope Out: 10:39:27 AM Scope Withdrawal Time: 0 hours 10 minutes 26 seconds  Total Procedure Duration: 0 hours 12 minutes 50 seconds  Findings:      Scattered small and large-mouthed diverticula were found in the entire       colon. The hematochezia was most likely hemorrhoidal in origin or a very       mild diverticular bleed. Impression:               - Diverticulosis in  the entire examined colon.                           - No specimens collected. Moderate Sedation:      Not Applicable - Patient had care per Anesthesia. Recommendation:           - Patient has a contact number available for                            emergencies. The signs and symptoms of potential                            delayed complications were discussed with the                            patient. Return to normal activities tomorrow.                            Written discharge instructions were provided to the                            patient.                           - Resume previous diet.                           - Continue present medications.                           - Repeat colonoscopy is not  recommended due to                            current age (68 years or older) for surveillance. Procedure Code(s):        --- Professional ---                           [id number], Colonoscopy, flexible; diagnostic, including                            collection of specimen(s) by brushing or washing,                            when performed (separate procedure) Diagnosis Code(s):        --- Professional ---                           K92.1, Melena (includes Hematochezia)                           K57.30, Diverticulosis of large intestine without                            perforation or abscess without bleeding CPT copyright 2019 [employer]. All rights reserved. The codes documented in this report are preliminary and upon coder review may  be revised to  meet current compliance requirements. Carol Ada, MD Carol Ada, MD 02/12/2019 10:45:47 AM This report has been signed electronically. Number of Addenda: 0

## 2019-02-12 NOTE — Discharge Instructions (Signed)

## 2019-02-15 ENCOUNTER — Encounter (HOSPITAL_COMMUNITY): Payer: Self-pay | Admitting: Gastroenterology

## 2019-02-25 ENCOUNTER — Other Ambulatory Visit: Payer: Self-pay | Admitting: Internal Medicine

## 2019-02-25 ENCOUNTER — Encounter: Payer: Self-pay | Admitting: *Deleted

## 2019-02-25 DIAGNOSIS — E1121 Type 2 diabetes mellitus with diabetic nephropathy: Secondary | ICD-10-CM

## 2019-03-18 ENCOUNTER — Ambulatory Visit: Payer: Medicare Other | Admitting: Internal Medicine

## 2019-05-05 ENCOUNTER — Encounter: Payer: Self-pay | Admitting: Internal Medicine

## 2019-05-06 ENCOUNTER — Other Ambulatory Visit: Payer: Self-pay

## 2019-05-06 ENCOUNTER — Encounter: Payer: Self-pay | Admitting: Internal Medicine

## 2019-05-06 ENCOUNTER — Ambulatory Visit (INDEPENDENT_AMBULATORY_CARE_PROVIDER_SITE_OTHER): Payer: Medicare Other | Admitting: Internal Medicine

## 2019-05-06 ENCOUNTER — Other Ambulatory Visit: Payer: Self-pay | Admitting: Dietician

## 2019-05-06 ENCOUNTER — Telehealth: Payer: Self-pay | Admitting: Dietician

## 2019-05-06 VITALS — BP 145/58 | HR 68 | Temp 98.7°F | Wt 203.2 lb

## 2019-05-06 DIAGNOSIS — J3489 Other specified disorders of nose and nasal sinuses: Secondary | ICD-10-CM

## 2019-05-06 DIAGNOSIS — I130 Hypertensive heart and chronic kidney disease with heart failure and stage 1 through stage 4 chronic kidney disease, or unspecified chronic kidney disease: Secondary | ICD-10-CM | POA: Diagnosis not present

## 2019-05-06 DIAGNOSIS — Z79899 Other long term (current) drug therapy: Secondary | ICD-10-CM | POA: Diagnosis not present

## 2019-05-06 DIAGNOSIS — K860 Alcohol-induced chronic pancreatitis: Secondary | ICD-10-CM

## 2019-05-06 DIAGNOSIS — D696 Thrombocytopenia, unspecified: Secondary | ICD-10-CM

## 2019-05-06 DIAGNOSIS — K861 Other chronic pancreatitis: Secondary | ICD-10-CM | POA: Diagnosis not present

## 2019-05-06 DIAGNOSIS — K5900 Constipation, unspecified: Secondary | ICD-10-CM | POA: Diagnosis not present

## 2019-05-06 DIAGNOSIS — I152 Hypertension secondary to endocrine disorders: Secondary | ICD-10-CM

## 2019-05-06 DIAGNOSIS — N189 Chronic kidney disease, unspecified: Secondary | ICD-10-CM | POA: Diagnosis not present

## 2019-05-06 DIAGNOSIS — I509 Heart failure, unspecified: Secondary | ICD-10-CM | POA: Diagnosis not present

## 2019-05-06 DIAGNOSIS — M1000 Idiopathic gout, unspecified site: Secondary | ICD-10-CM

## 2019-05-06 DIAGNOSIS — Z794 Long term (current) use of insulin: Secondary | ICD-10-CM

## 2019-05-06 DIAGNOSIS — I2510 Atherosclerotic heart disease of native coronary artery without angina pectoris: Secondary | ICD-10-CM

## 2019-05-06 DIAGNOSIS — G4733 Obstructive sleep apnea (adult) (pediatric): Secondary | ICD-10-CM

## 2019-05-06 DIAGNOSIS — R011 Cardiac murmur, unspecified: Secondary | ICD-10-CM

## 2019-05-06 DIAGNOSIS — I2723 Pulmonary hypertension due to lung diseases and hypoxia: Secondary | ICD-10-CM | POA: Diagnosis not present

## 2019-05-06 DIAGNOSIS — E1122 Type 2 diabetes mellitus with diabetic chronic kidney disease: Secondary | ICD-10-CM

## 2019-05-06 DIAGNOSIS — M549 Dorsalgia, unspecified: Secondary | ICD-10-CM

## 2019-05-06 DIAGNOSIS — M109 Gout, unspecified: Secondary | ICD-10-CM | POA: Diagnosis not present

## 2019-05-06 DIAGNOSIS — Z87891 Personal history of nicotine dependence: Secondary | ICD-10-CM | POA: Diagnosis not present

## 2019-05-06 DIAGNOSIS — K5790 Diverticulosis of intestine, part unspecified, without perforation or abscess without bleeding: Secondary | ICD-10-CM

## 2019-05-06 DIAGNOSIS — E1121 Type 2 diabetes mellitus with diabetic nephropathy: Secondary | ICD-10-CM | POA: Diagnosis not present

## 2019-05-06 DIAGNOSIS — E1151 Type 2 diabetes mellitus with diabetic peripheral angiopathy without gangrene: Secondary | ICD-10-CM

## 2019-05-06 DIAGNOSIS — E1159 Type 2 diabetes mellitus with other circulatory complications: Secondary | ICD-10-CM

## 2019-05-06 DIAGNOSIS — R05 Cough: Secondary | ICD-10-CM

## 2019-05-06 DIAGNOSIS — I70213 Atherosclerosis of native arteries of extremities with intermittent claudication, bilateral legs: Secondary | ICD-10-CM

## 2019-05-06 DIAGNOSIS — Z23 Encounter for immunization: Secondary | ICD-10-CM

## 2019-05-06 DIAGNOSIS — Z0000 Encounter for general adult medical examination without abnormal findings: Secondary | ICD-10-CM

## 2019-05-06 DIAGNOSIS — Z7982 Long term (current) use of aspirin: Secondary | ICD-10-CM | POA: Diagnosis not present

## 2019-05-06 DIAGNOSIS — R1312 Dysphagia, oropharyngeal phase: Secondary | ICD-10-CM

## 2019-05-06 DIAGNOSIS — I272 Pulmonary hypertension, unspecified: Secondary | ICD-10-CM

## 2019-05-06 LAB — GLUCOSE, CAPILLARY: Glucose-Capillary: 144 mg/dL — ABNORMAL HIGH (ref 70–99)

## 2019-05-06 LAB — POCT GLYCOSYLATED HEMOGLOBIN (HGB A1C): Hemoglobin A1C: 6.3 % — AB (ref 4.0–5.6)

## 2019-05-06 MED ORDER — ATORVASTATIN CALCIUM 40 MG PO TABS: 40.0000 mg | ORAL_TABLET | Freq: Every day | ORAL | 3 refills | Status: DC

## 2019-05-06 NOTE — Assessment & Plan Note (Signed)
This problem is chronic and uncontrolled.  His prior blood pressures have been better controlled than today's value although today's is not horrible.  He states he checks his blood pressure at home and the systolic ranges between 014 and 145.  He is on Demadex, eplerenone 50 twice daily, labetalol 400 twice daily, verapamil 360, minoxidil 20, and hydralazine 100 3 times daily.  I think his untreated OSA is contributing to his poor hypertension that he is not yet committed to repeating a sleep study or using CPAP.  PLAN:  Cont current meds Continue to discuss sleep study and CPAP at future appointments  BP Readings from Last 3 Encounters:  05/06/19 (!) 145/58  02/12/19 127/63  01/21/19 (!) 138/59

## 2019-05-06 NOTE — Assessment & Plan Note (Signed)
This problem is chronic and stable.  He was found to have diverticulosis but no sites of bleeding during his recent colonoscopy which was done due to hematochezia.  He has had no further hematochezia.  Hemoglobin is stable but I do not see a ferritin in our system.  PLAN : Follow Consider ferritin check at next appointment

## 2019-05-06 NOTE — Assessment & Plan Note (Signed)
This problem is chronic and controlled.  He is on allopurinol 100 daily and has not had to use his colchicine in 3 months.  He has it for gout flares.  Uric acid level 8.4 in 2018.  PLAN:  Cont current meds

## 2019-05-06 NOTE — Assessment & Plan Note (Signed)
This problem is chronic and uncontrolled.  He is on liraglutide without any side effects.  His A1c today is 6.3 although that is likely unreliable due to his CKD.  His CBG download today shows that he is checking his sugar usually at least once a day, either morning fasting or before bed.  His morning fasting is routinely elevated between 140 and 170 and his evenings are also elevated to similar levels.  He denies any hypoglycemia.  Again, he endorses skipping his liraglutide if his blood sugar is less than 130 although this likely is not likely playing a part as I did not see any sugars that low on his log.  Therefore, I think starting with education to use this medication every day would be indicated rather than starting insulin.  His liraglutide is costing $130 a month so I have put in a referral to [facility] for medication assistance.  PLAN:  Cont current meds THn referral for medication assistance

## 2019-05-06 NOTE — Assessment & Plan Note (Signed)
This problem is chronic and stable.  He has not seen Dr. Justin Mend in about a year.  Dr. Justin Mend has indicated if a month follow-up in his last office note.  Mr. Bobier states that he will call his office to make an appointment.  He is on calcitriol, prescribed by Dr. Justin Mend.  Blood pressure and diabetes are only under moderate control but might be acceptable considering his age.  PLAN : F/U Dr Jason Nest notes

## 2019-05-06 NOTE — Assessment & Plan Note (Signed)
This problem is chronic and uncontrolled.  It is secondary to his untreated OSA.  He has seen Dr. Gwenlyn Found once in the past for consultation for diagnosis confirmation.  He uses oxygen as needed which is usually when he is active and is at 2 L/min.  He has a pulse ox at home and puts on his oxygen when it drops below 90.  He is not yet committed to repeating a sleep study.  He does not like sleeping with the mask and states that he breathes through his mouth at night so nasal prongs are not likely going to be of benefit.  We discussed that using oxygen is not treating the underlying condition and I recommended we pursue another sleep study.  He is going to think about it and is okay if I reassess at his next appointment.  PLAN : follow

## 2019-05-06 NOTE — Patient Instructions (Signed)
1. I will find a lower cost option for your sugar medicine 2. Make an appointment with Dr Justin Mend 3. Let me know if you change your mind about the sleep study 4. Let me know if the cough or the swallowing gets worse

## 2019-05-06 NOTE — Assessment & Plan Note (Signed)
This problem is new.  I noticed a cough while I was in the room.  He endorses cough but states that it is new.  He denies any other new symptoms except for the rhinorrhea and states that the cough is dry.  His exam was otherwise normal.  Since he does not endorse a chronic cough.  I will follow it at this time.   This problem is new.  He endorses oropharyngeal dysphagia and coughing after taking liquids and solids although it is worse with liquids.  This happens about once every other month.  It is not frequent enough for him to worry and does not want work-up at this time.  If it increases in frequency or severity, I will start with a modified barium swallow.

## 2019-05-06 NOTE — Assessment & Plan Note (Signed)
This problem is chronic and asymptomatic.  This appears to be a radiographic diagnosis only.  He denies any intermittent epigastric pain or loose stools.  Will leave on problem list for now as it may in the future impact his diabetic control or he may develop symptoms.  PLAN : follow

## 2019-05-06 NOTE — Telephone Encounter (Signed)
Yes.  Thank you.

## 2019-05-06 NOTE — Telephone Encounter (Signed)
Asked to assist patient with cost of Victoza that he says he is having trouble affording. Call to [facility] about cost of victoza for this pt.:he has paid $131since April for Victoza, they do not know if he is in the doughnut hole. Can we look into refering to [facility] to see if patient will qualify for free access program?

## 2019-05-06 NOTE — Assessment & Plan Note (Signed)
This problem is chronic and stable.  Platelets are incidentally found to be slightly low on lab testing.  His liver appeared normal on ultrasound and CT in 2019.  He is asymptomatic.    PLAN : Will follow.

## 2019-05-06 NOTE — Progress Notes (Signed)
Opened in error

## 2019-05-06 NOTE — Progress Notes (Signed)
   Subjective:    Patient ID: Dylan [name], male    DOB: Jan 26, 1946, 73 y.o.   MRN: [id number]  HPI  Ad Dylan [name] is here for DM & HTN. Please see the A&P for the status of the pt's chronic medical problems.  ROS : per ROS section and in problem oriented charting. All other systems are negative.  PMHx, Soc hx, and / or Fam hx : His dog lemon is now 40 or 44 months old and weighs 40 pounds.  He stays active walking the dog or mowing the yard.  He is not using his treadmill anymore.  Review of Systems  Constitutional: Positive for unexpected weight change.  HENT: Positive for rhinorrhea. Negative for sinus pressure, sneezing and sore throat.   Respiratory: Positive for cough. Negative for shortness of breath.        + DOE  Gastrointestinal: Positive for constipation. Negative for abdominal pain and diarrhea.       No GERD + Oropharyngeal dysphagia and choking  Musculoskeletal: Positive for back pain. Negative for arthralgias and myalgias.  Psychiatric/Behavioral: Negative for sleep disturbance.       Objective:   Physical Exam Constitutional:      General: He is not in acute distress.    Appearance: Normal appearance. He is obese. He is not ill-appearing, toxic-appearing or diaphoretic.  HENT:     Head: Normocephalic and atraumatic.     Right Ear: External ear normal.     Left Ear: External ear normal.     Nose: Nose normal. No congestion or rhinorrhea.     Mouth/Throat:     Mouth: Mucous membranes are moist.     Pharynx: Oropharynx is clear. No oropharyngeal exudate or posterior oropharyngeal erythema.  Eyes:     Extraocular Movements: Extraocular movements intact.     Conjunctiva/sclera: Conjunctivae normal.     Comments: Clear lacrimation bilaterally  Cardiovascular:     Rate and Rhythm: Normal rate and regular rhythm.     Comments: Faint systolic murmur left heart border Trace lower extremity edema bilaterally Pulmonary:     Effort: Pulmonary effort is normal. No  respiratory distress.     Breath sounds: Normal breath sounds. No rhonchi.  Abdominal:     General: Bowel sounds are normal.     Palpations: Abdomen is soft.     Comments: Abdominal obesity  Musculoskeletal:        General: No swelling, tenderness, deformity or signs of injury.  Skin:    General: Skin is warm and dry.  Neurological:     General: No focal deficit present.     Mental Status: He is alert. Mental status is at baseline.  Psychiatric:        Mood and Affect: Mood normal.        Behavior: Behavior normal.        Thought Content: Thought content normal.        Judgment: Judgment normal.       Assessment & Plan:

## 2019-05-06 NOTE — Assessment & Plan Note (Signed)
This problem is chronic and controlled.  He denies any symptoms of claudication.  He is on aspirin but his statin fell off his medication list.  He is in agreement to resume a high intensity statin, atorvastatin 40 mg.  He is no longer walking on his treadmill but endorses routine exercise.  His last ABI was in February 2019 and was stable/improved.  He does not appear to be seeing Dr. Bridgett Larsson on a regular basis anymore.  His last appointment was in July 2018 and every 72-monthappointment and ABIs have been recommended.  His underlying medical issues are being treated and he has moderate control of his diabetes and hypertension.  He no longer smokes.  PLAN : Cont aspirin High intensity statin Discuss ABIs at his next appointment

## 2019-05-06 NOTE — Assessment & Plan Note (Signed)
This problem is chronic and uncontrolled.  He will consider sleep study.

## 2019-05-17 DIAGNOSIS — D631 Anemia in chronic kidney disease: Secondary | ICD-10-CM | POA: Diagnosis not present

## 2019-05-17 DIAGNOSIS — N2581 Secondary hyperparathyroidism of renal origin: Secondary | ICD-10-CM | POA: Diagnosis not present

## 2019-05-17 DIAGNOSIS — I129 Hypertensive chronic kidney disease with stage 1 through stage 4 chronic kidney disease, or unspecified chronic kidney disease: Secondary | ICD-10-CM | POA: Diagnosis not present

## 2019-05-17 DIAGNOSIS — E1122 Type 2 diabetes mellitus with diabetic chronic kidney disease: Secondary | ICD-10-CM | POA: Diagnosis not present

## 2019-05-17 DIAGNOSIS — N183 Chronic kidney disease, stage 3 unspecified: Secondary | ICD-10-CM | POA: Diagnosis not present

## 2019-05-17 LAB — BASIC METABOLIC PANEL
BUN: 21 (ref 4–21)
CO2: 25 — AB (ref 13–22)
Chloride: 109 — AB (ref 99–108)
Creatinine: 1.8 — AB (ref 0.6–1.3)
Glucose: 165
Potassium: 4.3 (ref 3.4–5.3)
Sodium: 145 (ref 137–147)

## 2019-05-17 LAB — HEPATIC FUNCTION PANEL
ALT: 9 — AB (ref 10–40)
AST: 11 — AB (ref 14–40)
Alkaline Phosphatase: 51 (ref 25–125)

## 2019-05-17 LAB — COMPREHENSIVE METABOLIC PANEL: Calcium: 9.9 (ref 8.7–10.7)

## 2019-05-19 ENCOUNTER — Other Ambulatory Visit: Payer: Self-pay | Admitting: *Deleted

## 2019-05-19 DIAGNOSIS — I5032 Chronic diastolic (congestive) heart failure: Secondary | ICD-10-CM

## 2019-05-19 MED ORDER — TORSEMIDE 10 MG PO TABS: ORAL_TABLET | ORAL | 2 refills | Status: DC

## 2019-05-19 NOTE — Patient Outreach (Signed)
[facility]) Care Management  05/19/2019  Dylan [name] [birth year] [id number]    Referral received 10/29 Initial outreach 05/19/2019  RN spoke with pt today and purpose for today's call. Pt aware and indicated he needed assistance with the cost of a new medication called Victoza. States it's $100 to fill. Offered to make a referral to [facility] to assist with any available programs (pt receptive). RN also inquired if pt had any other issues that he needed to address. Pt states he is a diabetes over 20 years and doing well. Pt reports his CBG around 140 in the AM and the last A1c was 6.3 on 10/29. No other issues or needs at this time.   PLAN: Will make a pharmacy referral and closed this discipline with no other needs to address at this time. Pt very appreciative for the call and assistance offered today.  Raina Mina, RN Care Management Coordinator [place] Office [phone]

## 2019-05-21 ENCOUNTER — Other Ambulatory Visit: Payer: Self-pay | Admitting: Pharmacist

## 2019-05-21 NOTE — Patient Outreach (Signed)
[facility]) Care Management  [place]   05/21/2019  Dylan [name] [birth year] [id number]  Reason for referral: Medication Assistance with Liraglutide  Referral source: [facility] RN Current insurance: Unknown  PMHx includes but not limited to:  OSA, T2DM, HTN, HF, CKD, morbid obesity, pulmonary HTN, chronic pancreatitis, CAD   Outreach:  Unsuccessful telephone call attempt #1 to patient. Unable to leave message  Plan:  -I will mail patient an unsuccessful outreach letter.  -I will make another outreach attempt to patient within 3-4 business days.    Ralene Bathe, PharmD, [place] [phone]

## 2019-05-27 ENCOUNTER — Ambulatory Visit: Payer: Self-pay | Admitting: Pharmacist

## 2019-05-27 ENCOUNTER — Other Ambulatory Visit: Payer: Self-pay | Admitting: Pharmacist

## 2019-05-27 NOTE — Patient Outreach (Signed)
[place] [facility]) Care Management  [place]   05/27/2019  ALMOND FITZGIBBON [birth year] [id number]  Reason for referral: Medication Assistance with Liraglutide  Referral source: [facility] RN Current insurance: Unknown  PMHx includes but not limited to:  OSA, T2DM, HTN, HF, CKD, morbid obesity, pulmonary HTN, chronic pancreatitis, CAD   Outreach:  Unsuccessful telephone call attempt #2 to patient. Unable to leave message  Plan:  -I will make another outreach attempt to patient within 3-4 business days.    Ralene Bathe, PharmD, [place] [phone]

## 2019-05-31 ENCOUNTER — Other Ambulatory Visit: Payer: Self-pay

## 2019-05-31 NOTE — Patient Outreach (Addendum)
[place] [facility]) Care Management  [place]   05/31/2019  Dylan [name] 09-01-1945 [id number]  Reason for referral: Medication Assistance with Liraglutide  Referral source: [facility] RN Current insurance: [name]  PMHx includes but not limited to:  T2DM, HTN, HF (EF 60-65% 2/'19), OSA, CKD, chronic pancreatitis, CAD  Outreach:  Successful telephone call with patient.  HIPAA identifiers verified.   Subjective:   Patient reported having [employer] for prescriptions.  Patient reported FBG around 180s.  Patient reports that pill boxes do not work for him and that he "just remembers" to take his medications.  Patient denies issues affording his medical supplies or medications other than Victoza.  Patient expressed that he did not think Victoza was as efficacious as previous "insulins" he had used.  Patient reported an annual income of around $24,000/year.   Objective: The ASCVD Risk score Mikey Bussing DC Jr., et al., 2013) failed to calculate for the following reasons:   Cannot find a previous HDL lab   Cannot find a previous total cholesterol lab  Lab Results  Component Value Date   CREATININE 1.85 (H) 01/20/2019   CREATININE 2.01 (H) 12/17/2018   CREATININE 2.0 (A) 05/20/2018  CrCl 46 ml/min eGFR 41 ml/min  Lab Results  Component Value Date   HGBA1C 6.3 (A) 05/06/2019    Lipid Panel     Component Value Date/Time   CHOL 142 01/12/2015 1032   TRIG 87 01/12/2015 1032   HDL 31 (L) 01/12/2015 1032   CHOLHDL 4.6 01/12/2015 1032   VLDL 17 01/12/2015 1032   LDLCALC 94 01/12/2015 1032    BP Readings from Last 3 Encounters:  05/06/19 (!) 145/58  02/12/19 127/63  01/21/19 (!) 138/59    Allergies  Allergen Reactions  . Ace Inhibitors Other (See Comments)    "ARF - see CRF overview"  . Spironolactone Other (See Comments)    Gynecomastia per pt report    Medications Reviewed Today    Reviewed by Darral Dash, RN (Registered Nurse) on 02/12/19  at 1003  Med List Status: Complete  Medication Order Taking? Sig Documenting Provider Last Dose Status Informant  allopurinol (ZYLOPRIM) 100 MG tablet [id number] Yes TAKE 1 TABLET BY MOUTH ONCE DAILY  Patient taking differently: Take 100 mg by mouth daily.    Bartholomew Crews, MD Past Week Unknown time Active Self  aspirin (BAYER LOW DOSE) 81 MG EC tablet [id number] Yes Take 81 mg by mouth daily. Swallow whole. [provider] 02/11/2019 Unknown time Active Self  colchicine 0.6 MG tablet [id number] Yes Take one pill by mouth daily for an acute gout flare until pain is gone.  Patient taking differently: Take 0.6 mg by mouth See admin instructions. Take 0.6 mg by mouth once a day as directed for an acute gout flare until pain is gone   Bartholomew Crews, MD Past Week Unknown time Active Self  eplerenone (INSPRA) 50 MG tablet [id number] Yes Take 1 tablet (50 mg total) by mouth 2 (two) times daily. Aldine Contes, MD 02/11/2019 Unknown time Active Self  FREESTYLE LITE test strip [id number] Yes USE 1 STRIP TO CHECK GLUCOSE ONCE DAILY WITH MEALS AND AT BEDTIME  Patient taking differently: 1 each by Other route 4 (four) times daily - after meals and at bedtime.    Bartholomew Crews, MD Past Week Unknown time Active Self  hydrALAZINE (APRESOLINE) 50 MG tablet [id number] Yes TAKE 2 TABLETS BY MOUTH THREE TIMES DAILY  Patient taking differently: Take 50  mg by mouth 2 (two) times daily.    Bartholomew Crews, MD 02/12/2019 Unknown time Active Self  Insulin Pen Needle (PEN NEEDLES) 31G X 5 MM MISC [id number] Yes Inject 1 Dose into the skin daily. With Victoza Forde Dandy, PharmD Past Week Unknown time Active Self  ketotifen (ZADITOR) 0.025 % ophthalmic solution [id number] Yes Place 1 drop into both eyes daily as needed (allergies).  [provider] Past Week Unknown time Active Self  labetalol (NORMODYNE) 200 MG tablet [id number] Yes Take 2 tablets by mouth twice daily  Patient taking  differently: Take 400 mg by mouth 2 (two) times daily.    Bartholomew Crews, MD 02/12/2019 Unknown time Active Self  liraglutide (VICTOZA) 18 MG/3ML SOPN [id number] Yes Inject 1.2 subcutaneously for every day  Patient taking differently: Inject 1.2 mg into the skin daily.    Bartholomew Crews, MD 02/11/2019 Unknown time Active Self           Med Note Kenton Kingfisher, Germaine Pomfret Feb 10, 2019 12:21 PM)    minoxidil (LONITEN) 10 MG tablet [id number] Yes Take 2 tablets by mouth once daily  Patient taking differently: Take 10 mg by mouth 2 (two) times daily.    Bartholomew Crews, MD 02/11/2019 Unknown time Active Self  polyethylene glycol [facility] / GLYCOLAX) packet [id number] Yes Take 17 g by mouth daily as needed (constipation).  [provider] 02/12/2019 Unknown time Active Self  torsemide (DEMADEX) 10 MG tablet [id number] Yes Take 1 tablet in morning and 1 tablet in the evening. If your weight is up by 3lbs in 1 day, take another dose  Patient taking differently: Take 10 mg by mouth 2 (two) times daily.    Oval Linsey, MD 02/12/2019 Unknown time Active Self  verapamil (VERELAN PM) 360 MG 24 hr capsule [id number] Yes Take 1 capsule by mouth once daily  Patient taking differently: Take 360 mg by mouth daily.    Bartholomew Crews, MD 02/12/2019 Unknown time Active Self          Assessment: Drugs sorted by system: Cardiovascular: labetalol, hydralazine, verapamil, eplerenone, minoxidil, torsemide, aspirin, atorvastatin Endocrine: liraglutide Gout: allopurinol, colchicine  Medication Review / Adherence Findings:   Medications reviewed for appropriate renal dosing  Eplerenone PI recommends 53m daily with eGFR 30-49 ml/min.  Currently eGFR 41 ml/min and dose is 563mBID.   Not on ACEi/ARB due to CRF per PCP notes in [facility] . Patient reported previously taking torsemide before bedtime, and having to wake up around 3-4x nightly. . Patient also reported missing Victoza dose maybe once a  week.  Medication Assistance Findings:  Patient Assistance Programs: Vict[place] met: Yes o Out-of-pocket prescription expenditure met:   Not Applicable - Patient has met application requirements to apply for this program.    Plan:  Emphasized for patient to take torsemide in early evening to prevent having to wake up during the night. . Patient assistance letter will be sent to patient.  [facility] not feel comfortable [facility] will bring it to PCP office to review and next appt on Aug 05, 2019.      . Marland Kitcheniscussed with patient the option of another GLP1-agonist that could be dosed weekly. Patient was agreeable to discussing this with PCP at next office visit along with the patient assistance program application.   . Will route note to PCP and f/u with patient after visit with PCP  in January   Erick Blinks, PharmD Candidate [facility], PharmD, [facility] [phone]

## 2019-06-01 ENCOUNTER — Other Ambulatory Visit: Payer: Self-pay | Admitting: Internal Medicine

## 2019-06-01 ENCOUNTER — Ambulatory Visit: Payer: Self-pay | Admitting: Pharmacist

## 2019-06-01 DIAGNOSIS — N183 Chronic kidney disease, stage 3 unspecified: Secondary | ICD-10-CM

## 2019-06-01 DIAGNOSIS — Z794 Long term (current) use of insulin: Secondary | ICD-10-CM

## 2019-06-01 DIAGNOSIS — E1122 Type 2 diabetes mellitus with diabetic chronic kidney disease: Secondary | ICD-10-CM

## 2019-07-07 ENCOUNTER — Other Ambulatory Visit: Payer: Self-pay | Admitting: *Deleted

## 2019-07-07 ENCOUNTER — Other Ambulatory Visit: Payer: Self-pay | Admitting: Internal Medicine

## 2019-07-07 DIAGNOSIS — E1121 Type 2 diabetes mellitus with diabetic nephropathy: Secondary | ICD-10-CM

## 2019-07-07 NOTE — Telephone Encounter (Signed)
Needs refill on Insulin Pen Needle (PEN NEEDLES) 31G X 5 MM MISC  ;pt contact [place]  Pt said the Dr Lynnae January wanted to change his insulin, he also recvd from [facility] regarding a new insulin that he can take; pls contact  [phone]

## 2019-07-07 NOTE — Telephone Encounter (Signed)
Opened new encounter

## 2019-07-12 ENCOUNTER — Telehealth: Payer: Self-pay | Admitting: Internal Medicine

## 2019-07-12 MED ORDER — PEN NEEDLES 31G X 5 MM MISC: 1.0000 | Freq: Every day | 1 refills | Status: DC

## 2019-07-12 NOTE — Telephone Encounter (Signed)
Rtc, vmail not setup, will try again

## 2019-07-12 NOTE — Telephone Encounter (Signed)
Pt is requesting callback [phone]

## 2019-07-13 NOTE — Telephone Encounter (Signed)
Hi Dr Lynnae January, [facility] recommended switching Victoza to Trulicity for free access through the patient assistance program. We can switch to 1.5 mg/week if ok with you (can connect with Ralene Bathe if you agree/approve).

## 2019-07-13 NOTE — Telephone Encounter (Signed)
Pt stated out of insulin; stated Victoza is available at the pharmacy but cannot afford $130.00. And his doctor was going to change it to something else. Thanks

## 2019-07-14 ENCOUNTER — Other Ambulatory Visit: Payer: Self-pay | Admitting: Pharmacist

## 2019-07-14 MED ORDER — TRULICITY 1.5 MG/0.5ML ~~LOC~~ SOAJ: 1.5000 mg | SUBCUTANEOUS | 3 refills | Status: DC

## 2019-07-14 NOTE — Patient Outreach (Signed)
[facility]) Care Management  [place] 07/14/2019  Dylan [name] 09-17-1945 [id number]  Communication received from PCP office that patient unable to afford Victoza co-pay, ok to transition patient to Trulicity (weekly) injection from [place] (daily) injection if approved for patient assistance program. Office will provide samples of Victoza during application process for Trulicity.    Successful call to patient to provide update.  Patient voiced understanding and agreement.  Will mail him Trulicity patient assistance program application and patient will reach out to office directly regarding samples.    Plan: I will route patient assistance letter to [place] technician who will coordinate patient assistance program application process for medications listed above.  [facility] pharmacy technician will assist with obtaining all required documents from both patient and provider(s) and submit application(s) once completed.    Ralene Bathe, PharmD, [place] [phone]

## 2019-07-14 NOTE — Telephone Encounter (Signed)
Colleen from [facility] advised Truliclity order does not need to be sent to a pharmacy, just put in his chart. We can give him samples to of Victoza until Trulicity samples arrive. [facility] to work with patient to get Trulicity patient assistance application complete, however, it may take 4-6 weeks before he receives patient assistance.

## 2019-07-14 NOTE — Telephone Encounter (Signed)
I left a message for Dylan [name] about where to send the prescription for Trulicity and also if we should provide samples to him (coordinate care).

## 2019-07-14 NOTE — Telephone Encounter (Signed)
I am happy to make switch. Order is pended. Where do I send rx?

## 2019-07-14 NOTE — Telephone Encounter (Signed)
Thanks Done

## 2019-07-15 ENCOUNTER — Other Ambulatory Visit: Payer: Self-pay | Admitting: Pharmacy Technician

## 2019-07-15 NOTE — Patient Outreach (Signed)
[place] [facility]) Care Management  07/15/2019  YAN PANKRATZ 04-02-1946 [id number]                                       Medication Assistance Referral  Referral From: [place]: Trulicity / Ralph Leyden Cares Patient application portion:  Mailed Provider application portion: Faxed  to Dr. Chinita Pester Provider address/fax verified via: Office website  Follow up:  Will follow up with patient in 10-14 business days to confirm application(s) have been received.  Maud Deed Chana Bode Hiram Certified Pharmacy Technician [place] Management Direct Dial:[phone]

## 2019-07-15 NOTE — Telephone Encounter (Signed)
Called patient to offer Victoza samples. He had already picked up Victoza pens for the next month. I told him to call us for samples of Victoza so he can continue on it until he gets Trulicity through patient assistance. He verbalized understanding.

## 2019-07-20 ENCOUNTER — Encounter: Payer: Self-pay | Admitting: Internal Medicine

## 2019-08-05 ENCOUNTER — Encounter: Payer: Self-pay | Admitting: Internal Medicine

## 2019-08-05 ENCOUNTER — Other Ambulatory Visit: Payer: Self-pay

## 2019-08-05 ENCOUNTER — Ambulatory Visit (INDEPENDENT_AMBULATORY_CARE_PROVIDER_SITE_OTHER): Payer: Medicare Other | Admitting: Internal Medicine

## 2019-08-05 ENCOUNTER — Encounter: Payer: Self-pay | Admitting: Dietician

## 2019-08-05 ENCOUNTER — Ambulatory Visit: Payer: Medicare Other | Admitting: Dietician

## 2019-08-05 VITALS — BP 146/72 | HR 92 | Temp 98.2°F | Ht 66.0 in | Wt 202.3 lb

## 2019-08-05 DIAGNOSIS — I70213 Atherosclerosis of native arteries of extremities with intermittent claudication, bilateral legs: Secondary | ICD-10-CM | POA: Diagnosis not present

## 2019-08-05 DIAGNOSIS — Z7982 Long term (current) use of aspirin: Secondary | ICD-10-CM

## 2019-08-05 DIAGNOSIS — I272 Pulmonary hypertension, unspecified: Secondary | ICD-10-CM

## 2019-08-05 DIAGNOSIS — Z87891 Personal history of nicotine dependence: Secondary | ICD-10-CM

## 2019-08-05 DIAGNOSIS — D696 Thrombocytopenia, unspecified: Secondary | ICD-10-CM

## 2019-08-05 DIAGNOSIS — N1831 Chronic kidney disease, stage 3a: Secondary | ICD-10-CM

## 2019-08-05 DIAGNOSIS — E1122 Type 2 diabetes mellitus with diabetic chronic kidney disease: Secondary | ICD-10-CM

## 2019-08-05 DIAGNOSIS — Z79899 Other long term (current) drug therapy: Secondary | ICD-10-CM

## 2019-08-05 DIAGNOSIS — I130 Hypertensive heart and chronic kidney disease with heart failure and stage 1 through stage 4 chronic kidney disease, or unspecified chronic kidney disease: Secondary | ICD-10-CM

## 2019-08-05 DIAGNOSIS — M109 Gout, unspecified: Secondary | ICD-10-CM

## 2019-08-05 DIAGNOSIS — I5032 Chronic diastolic (congestive) heart failure: Secondary | ICD-10-CM

## 2019-08-05 DIAGNOSIS — Z9981 Dependence on supplemental oxygen: Secondary | ICD-10-CM | POA: Diagnosis not present

## 2019-08-05 DIAGNOSIS — Z6833 Body mass index (BMI) 33.0-33.9, adult: Secondary | ICD-10-CM

## 2019-08-05 DIAGNOSIS — N189 Chronic kidney disease, unspecified: Secondary | ICD-10-CM

## 2019-08-05 DIAGNOSIS — E1121 Type 2 diabetes mellitus with diabetic nephropathy: Secondary | ICD-10-CM | POA: Diagnosis not present

## 2019-08-05 DIAGNOSIS — E1159 Type 2 diabetes mellitus with other circulatory complications: Secondary | ICD-10-CM

## 2019-08-05 DIAGNOSIS — I152 Hypertension secondary to endocrine disorders: Secondary | ICD-10-CM

## 2019-08-05 DIAGNOSIS — I10 Essential (primary) hypertension: Secondary | ICD-10-CM

## 2019-08-05 DIAGNOSIS — M1000 Idiopathic gout, unspecified site: Secondary | ICD-10-CM

## 2019-08-05 LAB — POCT GLYCOSYLATED HEMOGLOBIN (HGB A1C): Hemoglobin A1C: 6 % — AB (ref 4.0–5.6)

## 2019-08-05 LAB — GLUCOSE, CAPILLARY: Glucose-Capillary: 168 mg/dL — ABNORMAL HIGH (ref 70–99)

## 2019-08-05 MED ORDER — ALLOPURINOL 100 MG PO TABS: 100.0000 mg | ORAL_TABLET | Freq: Every day | ORAL | 3 refills | Status: DC

## 2019-08-05 MED ORDER — HYDRALAZINE HCL 50 MG PO TABS: 100.0000 mg | ORAL_TABLET | Freq: Three times a day (TID) | ORAL | 3 refills | Status: DC

## 2019-08-05 NOTE — Progress Notes (Signed)
Assisted patient with Trulicity samples per Dr. Lynnae January. He received the application from [facility] for patient assistance, but has trouble with vision and needs help competing it. He intends to ask his daughter for help. He was educated today on how to use the Trulicity pen and repeated demonstration back with a little bit of assistance.

## 2019-08-05 NOTE — Assessment & Plan Note (Signed)
This problem is chronic and uncontrolled.  His A1c is 6 today but it is falsely low due to his renal failure.  He checks his CBG fasting and they are all running in the upper 100s and occasionally in the low 200s.  His lowest was 128, highest 211.  He is on Trulicity weekly 1.5 mg which is costing $130 a month.  I discussed his medical management with [name] our diabetes educator and she was going to provide him with samples of Trulicity 3 mg and be a liason between him and [facility] for drug assistance programs.  He is having no side effects to Trulicity currently so hopefully that will continue with a higher dose.  PLAN : increase Trulicty (dulaglutide) to 3 mg weekly

## 2019-08-05 NOTE — Patient Instructions (Signed)
1. I refilled your medicines 2. See me in 3 months 3. Try the higher dose of diabetes medicine

## 2019-08-05 NOTE — Assessment & Plan Note (Signed)
This problem is chronic and stable.  He is on allopurinol 100 daily.  He has not taken his as needed colchicine in quite some time.

## 2019-08-05 NOTE — Progress Notes (Signed)
   Subjective:    Patient ID: Dylan [name], male    DOB: 11/10/1945, 74 y.o.   MRN: [id number]  HPI  JASHER BARKAN is here for DM FU. Please see the A&P for the status of the pt's chronic medical problems.  ROS : per ROS section and in problem oriented charting. All other systems are negative.  PMHx, Soc hx, and / or Fam hx : Still walking [place]. Daughter got a new treadmill but not walking on it.   Review of Systems  Constitutional: Negative for activity change and unexpected weight change.  Gastrointestinal: Negative for abdominal pain and nausea.  Psychiatric/Behavioral: Negative for sleep disturbance.       Objective:   Physical Exam Constitutional:      Appearance: Normal appearance. He is obese.  HENT:     Head: Normocephalic and atraumatic.     Right Ear: External ear normal.     Left Ear: External ear normal.  Eyes:     General: No scleral icterus.       Right eye: No discharge.        Left eye: No discharge.  Cardiovascular:     Rate and Rhythm: Normal rate and regular rhythm.  Pulmonary:     Effort: Pulmonary effort is normal.     Breath sounds: Normal breath sounds.  Abdominal:     Comments: Inc abd girth  Skin:    General: Skin is warm and dry.  Neurological:     General: No focal deficit present.     Mental Status: He is alert. Mental status is at baseline.  Psychiatric:        Mood and Affect: Mood normal.        Behavior: Behavior normal.        Thought Content: Thought content normal.        Judgment: Judgment normal.       Assessment & Plan:  Billing information.  His uncontrolled pulmonary hypertension, CKD, hypertension, and diabetes or chronic illnesses that poses a threat to life or bodily function.

## 2019-08-05 NOTE — Assessment & Plan Note (Signed)
This problem is chronic and uncontrolled.  His BMI is 33.  He has lost 8 pounds in the past 5 months or so.  PLAN : follow   Wt Readings from Last 3 Encounters:  08/05/19 202 lb 4.8 oz (91.8 kg)  05/06/19 203 lb 3.2 oz (92.2 kg)  02/12/19 210 lb (95.3 kg)

## 2019-08-05 NOTE — Assessment & Plan Note (Signed)
This problem is chronic and stable.  He states Dr. Justin Mend obtained blood work at his appointment 3 weeks ago so I will obtain those results.  PLAN : follow

## 2019-08-05 NOTE — Assessment & Plan Note (Signed)
This problem is chronic and uncontrolled.  He states saw Dr Justin Mend 3 weeks ago and we will request those records.  He states Dr. Justin Mend obtained blood work but not urine.  I will check a urine microalbumin today as he is not on a ACE inhibitor or ARB.  PLAN : microalb

## 2019-08-05 NOTE — Assessment & Plan Note (Signed)
This is chronic and uncontrolled.  He remains on his antihypertensive regimen and had all of his bottles today. He is on Demadex 10 BID, eplerenone 50 twice daily, labetalol 400 twice daily, verapamil 360, minoxidil 20, and hydralazine 100 3 times daily.  He has not run out of any of his medicines.  Blood pressure is slightly elevated today and I neglected to have it rechecked.  He saw Dr Justin Mend 3 weeks ago and did not have any medication changes.  Uncontrolled sleep apnea is likely controlling contributing to his poor blood pressure but he is not interested in pursuing a sleep study at this time.  We will continue current medications and recheck blood pressure at his next appointment.  PLAN:  Cont current meds  BP Readings from Last 3 Encounters:  08/05/19 (!) 146/72  05/06/19 (!) 145/58  02/12/19 127/63

## 2019-08-05 NOTE — Assessment & Plan Note (Signed)
This problem is chronic and controlled.  He uses O2 by nasal cannula at home as needed.  If he moves around a lot, he might feel that he is not breathing properly.  He will then go and check his O2 sat and if it is less than 90, he will put on the oxygen.  He is not interested in pursuing a sleep study at this time.  PLAN : follow

## 2019-08-05 NOTE — Assessment & Plan Note (Signed)
This problem is chronic and controlled.  He is on Demadex 10 mg twice daily as part of his hypertensive regimen.  He is not volume overloaded today.  PLAN:  Cont current meds

## 2019-08-05 NOTE — Assessment & Plan Note (Signed)
This problem is chronic and stable.  He is on atorvastatin 40 mg, high intensity statin, and aspirin contract.  He is not yet in a regimented exercise program but is walking his dog. Not seeing Dr Bridgett Larsson. Overdue for ABIs.  PLAN : cont current meds Discuss ABIs

## 2019-08-06 ENCOUNTER — Ambulatory Visit: Payer: Self-pay | Admitting: Pharmacist

## 2019-08-06 LAB — MICROALBUMIN / CREATININE URINE RATIO
Creatinine, Urine: 204.1 mg/dL
Microalb/Creat Ratio: 64 mg/g creat — ABNORMAL HIGH (ref 0–29)
Microalbumin, Urine: 131.2 ug/mL

## 2019-08-09 ENCOUNTER — Ambulatory Visit: Payer: Self-pay | Admitting: Pharmacist

## 2019-08-09 ENCOUNTER — Other Ambulatory Visit: Payer: Self-pay | Admitting: Pharmacist

## 2019-08-09 NOTE — Patient Outreach (Signed)
[facility]) Care Management  [place] 08/09/2019  Dylan [name] Oct 16, 1945 [id number]  Reason for call: f/u on patient assistance program application for Trulicity  Successful call to [name].  He reports he has appt with his bank tomorrow to pick up bank statement to prove household income and will return application with proof of income this week.  He states his daughter has been hard to "pin down" but he "thinks he filled out application right."  He has the return envelope to use to send back to [facility].    Plan: Await return of PAP documents  Ralene Bathe, PharmD, [place] [phone]

## 2019-08-19 ENCOUNTER — Other Ambulatory Visit: Payer: Self-pay | Admitting: Pharmacy Technician

## 2019-08-19 NOTE — Patient Outreach (Signed)
[facility]) Care Management  08/19/2019  HERSHELL BRANDL 07-20-1945 [id number]   Received patient portion(s) of patient assistance application(s) for Trulicity. Faxed completed application and required documents into [employer].  Will follow up with company(ies) in 7-10 business days to check status of application(s).  Maud Deed Chana Bode [place] Certified Pharmacy Technician [place] Management Direct Dial:[phone]

## 2019-09-09 ENCOUNTER — Other Ambulatory Visit: Payer: Self-pay | Admitting: Pharmacy Technician

## 2019-09-09 LAB — HM DIABETES EYE EXAM

## 2019-09-09 NOTE — Patient Outreach (Addendum)
[facility]) Care Management  09/09/2019  ABRAR BILTON [birth year] [id number]    Follow up call placed to [facility] regarding patient assistance application(s) for Trulicity , Tye [place] confirms patient has been approved as of 2/23 until 07/07/2020. Tye [place] also confirms 90 day supply of medication delivered to patients home on 2/25.  Follow up:  Will remove myself from care team  Maud Deed. Chana Bode [place] Certified Pharmacy Technician [place] Management Direct Dial:[phone]

## 2019-09-21 ENCOUNTER — Encounter: Payer: Self-pay | Admitting: *Deleted

## 2019-10-18 DIAGNOSIS — E1122 Type 2 diabetes mellitus with diabetic chronic kidney disease: Secondary | ICD-10-CM | POA: Diagnosis not present

## 2019-10-18 DIAGNOSIS — N2581 Secondary hyperparathyroidism of renal origin: Secondary | ICD-10-CM | POA: Diagnosis not present

## 2019-10-18 DIAGNOSIS — N189 Chronic kidney disease, unspecified: Secondary | ICD-10-CM | POA: Diagnosis not present

## 2019-10-18 DIAGNOSIS — I10 Essential (primary) hypertension: Secondary | ICD-10-CM | POA: Diagnosis not present

## 2019-10-18 DIAGNOSIS — N183 Chronic kidney disease, stage 3 unspecified: Secondary | ICD-10-CM | POA: Diagnosis not present

## 2019-10-21 ENCOUNTER — Encounter (HOSPITAL_COMMUNITY)
Admission: EM | Disposition: A | Discharge status: Home / Self Care | Payer: Self-pay | Source: Home / Self Care | Attending: Emergency Medicine

## 2019-10-21 ENCOUNTER — Other Ambulatory Visit: Payer: Self-pay

## 2019-10-21 ENCOUNTER — Emergency Department (HOSPITAL_COMMUNITY): Payer: Medicare Other | Admitting: Anesthesiology

## 2019-10-21 ENCOUNTER — Ambulatory Visit (HOSPITAL_COMMUNITY)
Admission: EM | Admit: 2019-10-21 | Discharge: 2019-10-22 | Disposition: A | Discharge status: Home / Self Care | Payer: Medicare Other | Attending: Emergency Medicine | Admitting: Emergency Medicine

## 2019-10-21 ENCOUNTER — Encounter (HOSPITAL_COMMUNITY): Payer: Self-pay

## 2019-10-21 ENCOUNTER — Emergency Department (HOSPITAL_COMMUNITY): Payer: Medicare Other

## 2019-10-21 DIAGNOSIS — S61402A Unspecified open wound of left hand, initial encounter: Secondary | ICD-10-CM | POA: Diagnosis not present

## 2019-10-21 DIAGNOSIS — S62307B Unspecified fracture of fifth metacarpal bone, left hand, initial encounter for open fracture: Secondary | ICD-10-CM | POA: Diagnosis not present

## 2019-10-21 DIAGNOSIS — S62617B Displaced fracture of proximal phalanx of left little finger, initial encounter for open fracture: Secondary | ICD-10-CM | POA: Diagnosis not present

## 2019-10-21 DIAGNOSIS — K861 Other chronic pancreatitis: Secondary | ICD-10-CM | POA: Diagnosis not present

## 2019-10-21 DIAGNOSIS — E1151 Type 2 diabetes mellitus with diabetic peripheral angiopathy without gangrene: Secondary | ICD-10-CM | POA: Insufficient documentation

## 2019-10-21 DIAGNOSIS — E1122 Type 2 diabetes mellitus with diabetic chronic kidney disease: Secondary | ICD-10-CM | POA: Diagnosis not present

## 2019-10-21 DIAGNOSIS — N183 Chronic kidney disease, stage 3 unspecified: Secondary | ICD-10-CM | POA: Diagnosis not present

## 2019-10-21 DIAGNOSIS — S61211A Laceration without foreign body of left index finger without damage to nail, initial encounter: Secondary | ICD-10-CM | POA: Diagnosis not present

## 2019-10-21 DIAGNOSIS — N189 Chronic kidney disease, unspecified: Secondary | ICD-10-CM | POA: Diagnosis not present

## 2019-10-21 DIAGNOSIS — Y93K1 Activity, walking an animal: Secondary | ICD-10-CM | POA: Diagnosis not present

## 2019-10-21 DIAGNOSIS — M109 Gout, unspecified: Secondary | ICD-10-CM | POA: Insufficient documentation

## 2019-10-21 DIAGNOSIS — I5032 Chronic diastolic (congestive) heart failure: Secondary | ICD-10-CM | POA: Insufficient documentation

## 2019-10-21 DIAGNOSIS — I2510 Atherosclerotic heart disease of native coronary artery without angina pectoris: Secondary | ICD-10-CM | POA: Diagnosis not present

## 2019-10-21 DIAGNOSIS — S01511A Laceration without foreign body of lip, initial encounter: Secondary | ICD-10-CM | POA: Diagnosis not present

## 2019-10-21 DIAGNOSIS — G4733 Obstructive sleep apnea (adult) (pediatric): Secondary | ICD-10-CM | POA: Insufficient documentation

## 2019-10-21 DIAGNOSIS — W230XXA Caught, crushed, jammed, or pinched between moving objects, initial encounter: Secondary | ICD-10-CM | POA: Insufficient documentation

## 2019-10-21 DIAGNOSIS — S61217A Laceration without foreign body of left little finger without damage to nail, initial encounter: Secondary | ICD-10-CM | POA: Diagnosis not present

## 2019-10-21 DIAGNOSIS — Z888 Allergy status to other drugs, medicaments and biological substances status: Secondary | ICD-10-CM | POA: Diagnosis not present

## 2019-10-21 DIAGNOSIS — S6992XA Unspecified injury of left wrist, hand and finger(s), initial encounter: Secondary | ICD-10-CM | POA: Diagnosis present

## 2019-10-21 DIAGNOSIS — Z7982 Long term (current) use of aspirin: Secondary | ICD-10-CM | POA: Insufficient documentation

## 2019-10-21 DIAGNOSIS — J449 Chronic obstructive pulmonary disease, unspecified: Secondary | ICD-10-CM | POA: Diagnosis not present

## 2019-10-21 DIAGNOSIS — Z20822 Contact with and (suspected) exposure to covid-19: Secondary | ICD-10-CM | POA: Diagnosis not present

## 2019-10-21 DIAGNOSIS — I130 Hypertensive heart and chronic kidney disease with heart failure and stage 1 through stage 4 chronic kidney disease, or unspecified chronic kidney disease: Secondary | ICD-10-CM | POA: Insufficient documentation

## 2019-10-21 DIAGNOSIS — Z9981 Dependence on supplemental oxygen: Secondary | ICD-10-CM | POA: Diagnosis not present

## 2019-10-21 DIAGNOSIS — S62617A Displaced fracture of proximal phalanx of left little finger, initial encounter for closed fracture: Secondary | ICD-10-CM | POA: Diagnosis not present

## 2019-10-21 DIAGNOSIS — Z794 Long term (current) use of insulin: Secondary | ICD-10-CM | POA: Insufficient documentation

## 2019-10-21 DIAGNOSIS — S62307A Unspecified fracture of fifth metacarpal bone, left hand, initial encounter for closed fracture: Secondary | ICD-10-CM | POA: Diagnosis not present

## 2019-10-21 DIAGNOSIS — T148XXA Other injury of unspecified body region, initial encounter: Secondary | ICD-10-CM

## 2019-10-21 DIAGNOSIS — Z79899 Other long term (current) drug therapy: Secondary | ICD-10-CM | POA: Diagnosis not present

## 2019-10-21 DIAGNOSIS — Z87891 Personal history of nicotine dependence: Secondary | ICD-10-CM | POA: Diagnosis not present

## 2019-10-21 DIAGNOSIS — Z6832 Body mass index (BMI) 32.0-32.9, adult: Secondary | ICD-10-CM | POA: Diagnosis not present

## 2019-10-21 HISTORY — PX: PERCUTANEOUS PINNING: SHX2209

## 2019-10-21 LAB — CBC WITH DIFFERENTIAL/PLATELET
Abs Immature Granulocytes: 0.05 10*3/uL (ref 0.00–0.07)
Basophils Absolute: 0.1 10*3/uL (ref 0.0–0.1)
Basophils Relative: 1 %
Eosinophils Absolute: 0.3 10*3/uL (ref 0.0–0.5)
Eosinophils Relative: 3 %
HCT: 40.2 % (ref 39.0–52.0)
Hemoglobin: 12.6 g/dL — ABNORMAL LOW (ref 13.0–17.0)
Immature Granulocytes: 1 %
Lymphocytes Relative: 18 %
Lymphs Abs: 1.6 10*3/uL (ref 0.7–4.0)
MCH: 31.4 pg (ref 26.0–34.0)
MCHC: 31.3 g/dL (ref 30.0–36.0)
MCV: 100.2 fL — ABNORMAL HIGH (ref 80.0–100.0)
Monocytes Absolute: 1.3 10*3/uL — ABNORMAL HIGH (ref 0.1–1.0)
Monocytes Relative: 14 %
Neutro Abs: 5.8 10*3/uL (ref 1.7–7.7)
Neutrophils Relative %: 63 %
Platelets: 152 10*3/uL (ref 150–400)
RBC: 4.01 MIL/uL — ABNORMAL LOW (ref 4.22–5.81)
RDW: 13.2 % (ref 11.5–15.5)
WBC: 9.2 10*3/uL (ref 4.0–10.5)
nRBC: 0 % (ref 0.0–0.2)

## 2019-10-21 LAB — BASIC METABOLIC PANEL
Anion gap: 10 (ref 5–15)
BUN: 24 mg/dL — ABNORMAL HIGH (ref 8–23)
CO2: 23 mmol/L (ref 22–32)
Calcium: 9.2 mg/dL (ref 8.9–10.3)
Chloride: 112 mmol/L — ABNORMAL HIGH (ref 98–111)
Creatinine, Ser: 1.76 mg/dL — ABNORMAL HIGH (ref 0.61–1.24)
GFR calc Af Amer: 43 mL/min — ABNORMAL LOW (ref >60)
GFR calc non Af Amer: 37 mL/min — ABNORMAL LOW (ref >60)
Glucose, Bld: 138 mg/dL — ABNORMAL HIGH (ref 70–99)
Potassium: 4.2 mmol/L (ref 3.5–5.1)
Sodium: 145 mmol/L (ref 135–145)

## 2019-10-21 LAB — PROTIME-INR
INR: 1.1 (ref 0.8–1.2)
Prothrombin Time: 14 seconds (ref 11.4–15.2)

## 2019-10-21 LAB — RESPIRATORY PANEL BY RT PCR (FLU A&B, COVID)
Influenza A by PCR: NEGATIVE
Influenza B by PCR: NEGATIVE
SARS Coronavirus 2 by RT PCR: NEGATIVE

## 2019-10-21 LAB — GLUCOSE, CAPILLARY
Glucose-Capillary: 114 mg/dL — ABNORMAL HIGH (ref 70–99)
Glucose-Capillary: 112 mg/dL — ABNORMAL HIGH (ref 70–99)

## 2019-10-21 SURGERY — PINNING, EXTREMITY, PERCUTANEOUS
Anesthesia: General | Site: Finger | Laterality: Left

## 2019-10-21 MED ORDER — FENTANYL CITRATE (PF) 250 MCG/5ML IJ SOLN
INTRAMUSCULAR | Status: AC
  Filled 2019-10-21: qty 5

## 2019-10-21 MED ORDER — HYDROCODONE-ACETAMINOPHEN 5-325 MG PO TABS: 1.0000 | ORAL_TABLET | Freq: Four times a day (QID) | ORAL | 0 refills | Status: DC | PRN

## 2019-10-21 MED ORDER — CEFAZOLIN SODIUM-DEXTROSE 2-4 GM/100ML-% IV SOLN
INTRAVENOUS | Status: AC
  Filled 2019-10-21: qty 100

## 2019-10-21 MED ORDER — ALBUTEROL SULFATE (2.5 MG/3ML) 0.083% IN NEBU
2.5000 mg | INHALATION_SOLUTION | Freq: Four times a day (QID) | RESPIRATORY_TRACT | Status: DC | PRN
  Administered 2019-10-21: 2.5 mg via RESPIRATORY_TRACT

## 2019-10-21 MED ORDER — LIDOCAINE HCL (CARDIAC) PF 100 MG/5ML IV SOSY
PREFILLED_SYRINGE | INTRAVENOUS | Status: DC | PRN
  Administered 2019-10-21: 40 mg via INTRATRACHEAL

## 2019-10-21 MED ORDER — ONDANSETRON HCL 4 MG/2ML IJ SOLN
INTRAMUSCULAR | Status: DC | PRN
  Administered 2019-10-21: 4 mg via INTRAVENOUS

## 2019-10-21 MED ORDER — BUPIVACAINE HCL (PF) 0.25 % IJ SOLN
INTRAMUSCULAR | Status: AC
  Filled 2019-10-21: qty 30

## 2019-10-21 MED ORDER — CEFAZOLIN SODIUM-DEXTROSE 2-4 GM/100ML-% IV SOLN
2.0000 g | INTRAVENOUS | Status: AC
  Administered 2019-10-21: 2 g via INTRAVENOUS

## 2019-10-21 MED ORDER — LACTATED RINGERS IV SOLN: INTRAVENOUS | Status: DC

## 2019-10-21 MED ORDER — BUPIVACAINE HCL (PF) 0.25 % IJ SOLN
INTRAMUSCULAR | Status: DC | PRN
  Administered 2019-10-21: 15 mL

## 2019-10-21 MED ORDER — ALBUTEROL SULFATE (2.5 MG/3ML) 0.083% IN NEBU
INHALATION_SOLUTION | RESPIRATORY_TRACT | Status: AC
  Filled 2019-10-21: qty 3

## 2019-10-21 MED ORDER — EPHEDRINE SULFATE 50 MG/ML IJ SOLN
INTRAMUSCULAR | Status: DC | PRN
  Administered 2019-10-21 (×2): 5 mg via INTRAVENOUS

## 2019-10-21 MED ORDER — FENTANYL CITRATE (PF) 250 MCG/5ML IJ SOLN
INTRAMUSCULAR | Status: DC | PRN
  Administered 2019-10-21: 50 ug via INTRAVENOUS

## 2019-10-21 MED ORDER — OXYCODONE HCL 5 MG/5ML PO SOLN: 5.0000 mg | Freq: Once | ORAL | Status: DC | PRN

## 2019-10-21 MED ORDER — PROPOFOL 10 MG/ML IV BOLUS
INTRAVENOUS | Status: DC | PRN
  Administered 2019-10-21: 110 mg via INTRAVENOUS
  Administered 2019-10-21: 20 mg via INTRAVENOUS

## 2019-10-21 MED ORDER — MORPHINE SULFATE (PF) 4 MG/ML IV SOLN
4.0000 mg | Freq: Once | INTRAVENOUS | Status: AC
  Administered 2019-10-21: 4 mg via INTRAVENOUS
  Filled 2019-10-21: qty 1

## 2019-10-21 MED ORDER — FENTANYL CITRATE (PF) 100 MCG/2ML IJ SOLN: 25.0000 ug | INTRAMUSCULAR | Status: DC | PRN

## 2019-10-21 MED ORDER — TETANUS-DIPHTH-ACELL PERTUSSIS 5-2.5-18.5 LF-MCG/0.5 IM SUSP
0.5000 mL | Freq: Once | INTRAMUSCULAR | Status: AC
  Administered 2019-10-21: 0.5 mL via INTRAMUSCULAR
  Filled 2019-10-21: qty 0.5

## 2019-10-21 MED ORDER — CEFAZOLIN SODIUM-DEXTROSE 2-4 GM/100ML-% IV SOLN
2.0000 g | Freq: Once | INTRAVENOUS | Status: AC
  Administered 2019-10-21: 2 g via INTRAVENOUS
  Filled 2019-10-21: qty 100

## 2019-10-21 MED ORDER — MIDAZOLAM HCL 2 MG/2ML IJ SOLN
INTRAMUSCULAR | Status: AC
  Filled 2019-10-21: qty 2

## 2019-10-21 MED ORDER — PHENYLEPHRINE HCL (PRESSORS) 10 MG/ML IV SOLN
INTRAVENOUS | Status: DC | PRN
  Administered 2019-10-21 (×3): 40 ug via INTRAVENOUS

## 2019-10-21 MED ORDER — LACTATED RINGERS IV SOLN: INTRAVENOUS | Status: DC | PRN

## 2019-10-21 MED ORDER — OXYCODONE HCL 5 MG PO TABS: 5.0000 mg | ORAL_TABLET | Freq: Once | ORAL | Status: DC | PRN

## 2019-10-21 MED ORDER — CEPHALEXIN 500 MG PO CAPS: 500.0000 mg | ORAL_CAPSULE | Freq: Four times a day (QID) | ORAL | 0 refills | Status: AC

## 2019-10-21 MED ORDER — SUCCINYLCHOLINE CHLORIDE 20 MG/ML IJ SOLN
INTRAMUSCULAR | Status: DC | PRN
  Administered 2019-10-21: 100 mg via INTRAVENOUS

## 2019-10-21 MED ORDER — PROPOFOL 10 MG/ML IV BOLUS
INTRAVENOUS | Status: AC
  Filled 2019-10-21: qty 40

## 2019-10-21 MED ORDER — 0.9 % SODIUM CHLORIDE (POUR BTL) OPTIME
TOPICAL | Status: DC | PRN
  Administered 2019-10-21: 1000 mL

## 2019-10-21 SURGICAL SUPPLY — 46 items
BAND RUBBER #7 7IN GRN STRL LF (MISCELLANEOUS) ×1 IMPLANT
BENZOIN TINCTURE PRP APPL 2/3 (GAUZE/BANDAGES/DRESSINGS) IMPLANT
BLADE CLIPPER SURG (BLADE) IMPLANT
BNDG ELASTIC 3X5.8 VLCR STR LF (GAUZE/BANDAGES/DRESSINGS) ×2 IMPLANT
BNDG ELASTIC 4X5.8 VLCR STR LF (GAUZE/BANDAGES/DRESSINGS) IMPLANT
BNDG GAUZE ELAST 4 BULKY (GAUZE/BANDAGES/DRESSINGS) ×4 IMPLANT
BNDG STRETCH 4X75 NS LF (GAUZE/BANDAGES/DRESSINGS) ×1 IMPLANT
CORD BIPOLAR FORCEPS 12FT (ELECTRODE) ×1 IMPLANT
COVER SURGICAL LIGHT HANDLE (MISCELLANEOUS) ×2 IMPLANT
COVER WAND RF STERILE (DRAPES) ×2 IMPLANT
CUFF TOURN SGL QUICK 18X4 (TOURNIQUET CUFF) ×1 IMPLANT
CUFF TOURN SGL QUICK 24 (TOURNIQUET CUFF)
CUFF TRNQT CYL 24X4X16.5-23 (TOURNIQUET CUFF) IMPLANT
DECANTER SPIKE VIAL GLASS SM (MISCELLANEOUS) ×1 IMPLANT
DRAPE C-ARM MINI (DRAPES) ×1 IMPLANT
DRSG EMULSION OIL 3X3 NADH (GAUZE/BANDAGES/DRESSINGS) IMPLANT
DRSG XEROFORM 1X8 (GAUZE/BANDAGES/DRESSINGS) ×1 IMPLANT
GAUZE SPONGE 4X4 12PLY STRL (GAUZE/BANDAGES/DRESSINGS) ×2 IMPLANT
GAUZE XEROFORM 1X8 LF (GAUZE/BANDAGES/DRESSINGS) ×1 IMPLANT
GLOVE BIOGEL M 8.0 STRL (GLOVE) ×1 IMPLANT
GLOVE SS BIOGEL STRL SZ 8 (GLOVE) ×1 IMPLANT
GLOVE SUPERSENSE BIOGEL SZ 8 (GLOVE) ×1
GOWN STRL REUS W/ TWL LRG LVL3 (GOWN DISPOSABLE) ×2 IMPLANT
GOWN STRL REUS W/ TWL XL LVL3 (GOWN DISPOSABLE) ×2 IMPLANT
GOWN STRL REUS W/TWL LRG LVL3 (GOWN DISPOSABLE) ×4
GOWN STRL REUS W/TWL XL LVL3 (GOWN DISPOSABLE) ×4
K-WIRE 0.9X102 (Wire) ×4 IMPLANT
KIT BASIN OR (CUSTOM PROCEDURE TRAY) ×2 IMPLANT
KIT TURNOVER KIT B (KITS) ×2 IMPLANT
KWIRE 0.9X102 (Wire) IMPLANT
MANIFOLD NEPTUNE II (INSTRUMENTS) ×2 IMPLANT
NS IRRIG 1000ML POUR BTL (IV SOLUTION) ×2 IMPLANT
PACK ORTHO EXTREMITY (CUSTOM PROCEDURE TRAY) ×2 IMPLANT
PAD ARMBOARD 7.5X6 YLW CONV (MISCELLANEOUS) ×4 IMPLANT
SOL PREP POV-IOD 4OZ 10% (MISCELLANEOUS) ×4 IMPLANT
SPLINT FIBERGLASS 3X12 (CAST SUPPLIES) ×1 IMPLANT
STRIP CLOSURE SKIN 1/2X4 (GAUZE/BANDAGES/DRESSINGS) IMPLANT
SUT CHROMIC 3 0 PS 2 (SUTURE) ×2 IMPLANT
SUT ETHILON 4 0 P 3 18 (SUTURE) IMPLANT
SUT ETHILON 5 0 P 3 18 (SUTURE)
SUT NYLON ETHILON 5-0 P-3 1X18 (SUTURE) IMPLANT
SUT PROLENE 4 0 P 3 18 (SUTURE) IMPLANT
TAPE UMBILICAL COTTON 1/8X30 (MISCELLANEOUS) ×1 IMPLANT
TOWEL GREEN STERILE (TOWEL DISPOSABLE) ×2 IMPLANT
TOWEL GREEN STERILE FF (TOWEL DISPOSABLE) ×2 IMPLANT
WATER STERILE IRR 1000ML POUR (IV SOLUTION) ×1 IMPLANT

## 2019-10-21 NOTE — Anesthesia Preprocedure Evaluation (Addendum)
Anesthesia Evaluation  Patient identified by MRN, date of birth, ID band Patient awake    Reviewed: Allergy & Precautions, NPO status , Patient's Chart, lab work & pertinent test results  History of Anesthesia Complications Negative for: history of anesthetic complications  Airway Mallampati: I  TM Distance: >3 FB Neck ROM: Full    Dental  (+) Dental Advisory Given   Pulmonary sleep apnea and Continuous Positive Airway Pressure Ventilation , COPD,  oxygen dependent, former smoker,  10/21/2019 SARS coronavirus NEG   breath sounds clear to auscultation       Cardiovascular hypertension, Pt. on medications + CAD and + Peripheral Vascular Disease   Rhythm:Regular Rate:Normal  '19 ECHO: EF 60% to 65%. Wall  motion was normal; abnormalleft ventricular relaxation (grade 1 diastolic dysfunction), valves OK, severe pulm HTN, PAP 65 mmHg   Neuro/Psych    GI/Hepatic negative GI ROS, Neg liver ROS,   Endo/Other  diabetes, Insulin DependentMorbid obesity  Renal/GU Renal InsufficiencyRenal disease (creat 1.76)     Musculoskeletal   Abdominal (+) + obese,   Peds  Hematology   Anesthesia Other Findings   Reproductive/Obstetrics                             Anesthesia Physical Anesthesia Plan  ASA: III  Anesthesia Plan: General   Post-op Pain Management:    Induction: Intravenous  PONV Risk Score and Plan: 2 and Ondansetron and Dexamethasone  Airway Management Planned: Oral ETT  Additional Equipment:   Intra-op Plan:   Post-operative Plan: Extubation in OR  Informed Consent: I have reviewed the patients History and Physical, chart, labs and discussed the procedure including the risks, benefits and alternatives for the proposed anesthesia with the patient or authorized representative who has indicated his/her understanding and acceptance.     Dental advisory given  Plan Discussed with:  CRNA and Surgeon  Anesthesia Plan Comments:         Anesthesia Quick Evaluation

## 2019-10-21 NOTE — H&P (Signed)
Reason for Consult/CC: Left small finger injury  Dylan [name] is an 74 y.o. male.  HPI: Patient presents after an accident while walking his dog.  The leash caught a small finger as the dog ran away causing an avulsion injury.  He has near circumferential laceration at the proximal phalanx level.  He has a displaced proximal phalanx.  Emergency room consulted me for evaluation.  He does not have any other signs of trauma or injury.  Allergies:  Allergies  Allergen Reactions  . Ace Inhibitors Other (See Comments)    "ARF - see CRF overview"  . Spironolactone Other (See Comments)    Gynecomastia per pt report    Medications:  Current Facility-Administered Medications:  .  ceFAZolin (ANCEF) 2-4 GM/100ML-% IVPB, , , ,  .  ceFAZolin (ANCEF) IVPB 2g/100 mL premix, 2 g, Intravenous, On Call to OR, Cindra Presume, MD .  lactated ringers infusion, , Intravenous, Continuous, Annye Asa, MD  Past Medical History:  Diagnosis Date  . Anemia 10/18/2014   Baseline about 12 and stable from 2010 to 2016. Colon 2009 in [place] (records cannot be obtained).  EGD Dr Benson [place] 2011 nl   . Chronic diastolic heart failure ([place]) 10/18/2014   Noted ECHO 10/2014. Grade 2. EF 50-55%   . Chronic renal failure, stage 3 (moderate) 10/18/2014   Baseline Cr about 1.5. Stable from 2010 to 2016.   [place] Kitchen Coronary artery disease   . Diverticulosis 10/08/2014   Seen on CT. Reportedly on Colon in [place] in 2009. Freq bouts of diverticulitis.  . DM (diabetes mellitus), type 2 with renal complications ([place]) 11/07/8411  . Essential hypertension 10/09/2014   Poor control with 5 drug therapy.   . Former tobacco use 10/08/2014  . Gout   . OSA on CPAP 10/08/2014  . Pulmonary HTN ([place]) 10/18/2014   Noted as severe ECHO 2016. Likely 2/2 severe OSA.    Past Surgical History:  Procedure Laterality Date  . CHOLECYSTECTOMY    . COLONOSCOPY WITH PROPOFOL N/A 02/12/2019   Procedure: COLONOSCOPY WITH PROPOFOL;  Surgeon: Carol Ada, MD;  Location:  [facility] ENDOSCOPY;  Service: Endoscopy;  Laterality: N/A;    Family History  Problem Relation Age of Onset  . Heart failure Sister        Died of complications Nov 2440  . CAD Sister        CABG x3 while in her 9's    Social History:  reports that he quit smoking about 47 years ago. He has never used smokeless tobacco. He reports that he does not drink alcohol or use drugs.  Physical Exam Blood pressure (!) 170/69, pulse 66, temperature 98.7 F (37.1 C), temperature source Oral, resp. rate 18, height _0  (1.676 m), weight 90.7 kg, SpO2 (!) 86 %. General: No acute distress.  Alert and oriented. Left hand: Fingers are all well perfused with normal capillary refill and palp radial pulse.  Sensation is intact throughout.  He has a near circumferential laceration at the proximal phalanx lateral level of the small finger.  It appears flexion and in extension are intact.  He reports equal and normal sensation in radial and ulnar nerve distributions on the finger.  There is a small laceration volar aspect of the index finger.  Results for orders placed or performed during the hospital encounter of 10/21/19 (from the past 48 hour(s))  CBC with Differential/Platelet     Status: Abnormal   Collection Time: 10/21/19  5:27 PM  Result Value  Ref Range   WBC 9.2 4.0 - 10.5 K/uL   RBC 4.01 (L) 4.22 - 5.81 MIL/uL   Hemoglobin 12.6 (L) 13.0 - 17.0 g/dL   HCT 40.2 39.0 - 52.0 %   MCV 100.2 (H) 80.0 - 100.0 fL   MCH 31.4 26.0 - 34.0 pg   MCHC 31.3 30.0 - 36.0 g/dL   RDW 13.2 11.5 - 15.5 %   Platelets 152 150 - 400 K/uL    Comment: REPEATED TO VERIFY   nRBC 0.0 0.0 - 0.2 %   Neutrophils Relative % 63 %   Neutro Abs 5.8 1.7 - 7.7 K/uL   Lymphocytes Relative 18 %   Lymphs Abs 1.6 0.7 - 4.0 K/uL   Monocytes Relative 14 %   Monocytes Absolute 1.3 (H) 0.1 - 1.0 K/uL   Eosinophils Relative 3 %   Eosinophils Absolute 0.3 0.0 - 0.5 K/uL   Basophils Relative 1 %   Basophils Absolute 0.1 0.0 - 0.1 K/uL    Immature Granulocytes 1 %   Abs Immature Granulocytes 0.05 0.00 - 0.07 K/uL    Comment: Performed at [facility], [address] [place], [place] [address]  Basic metabolic panel     Status: Abnormal   Collection Time: 10/21/19  5:27 PM  Result Value Ref Range   Sodium 145 135 - 145 mmol/L   Potassium 4.2 3.5 - 5.1 mmol/L   Chloride 112 (H) 98 - 111 mmol/L   CO2 23 22 - 32 mmol/L   Glucose, Bld 138 (H) 70 - 99 mg/dL    Comment: Glucose reference range applies only to samples taken after fasting for at least 8 hours.   BUN 24 (H) 8 - 23 mg/dL   Creatinine, Ser 1.76 (H) 0.61 - 1.24 mg/dL   Calcium 9.2 8.9 - 10.3 mg/dL   GFR calc non Af Amer 37 (L) >60 mL/min   GFR calc Af Amer 43 (L) >60 mL/min   Anion gap 10 5 - 15    Comment: Performed at [facility] [address] [place], [place] [address]  Protime-INR     Status: None   Collection Time: 10/21/19  5:27 PM  Result Value Ref Range   Prothrombin Time 14.0 11.4 - 15.2 seconds   INR 1.1 0.8 - 1.2    Comment: (NOTE) INR goal varies based on device and disease states. Performed at [facility], [place] [address] [place], [place] [address]   Respiratory Panel by RT PCR (Flu A&B, Covid) - Nasopharyngeal Swab     Status: None   Collection Time: 10/21/19  5:27 PM   Specimen: Nasopharyngeal Swab  Result Value Ref Range   SARS Coronavirus 2 by RT PCR NEGATIVE NEGATIVE    Comment: (NOTE) SARS-CoV-2 target nucleic acids are NOT DETECTED. The SARS-CoV-2 RNA is generally detectable in upper respiratoy specimens during the acute phase of infection. The lowest concentration of SARS-CoV-2 viral copies this assay can detect is 131 copies/mL. A negative result does not preclude SARS-Cov-2 infection and should not be used as the sole basis for treatment or other patient management decisions. A negative result may occur with  improper specimen collection/handling, submission of specimen other than nasopharyngeal swab,  presence of viral mutation(s) within the areas targeted by this assay, and inadequate number of viral copies (<131 copies/mL). A negative result must be combined with clinical observations, patient history, and epidemiological information. The expected result is Negative. Fact Sheet for Patients:  PinkCheek.be  Fact Sheet for Healthcare Providers:  GravelBags.it This test is not yet ap proved or cleared by the [place] FDA and  has been authorized for detection and/or diagnosis of SARS-CoV-2 by FDA under an Emergency Use Authorization (EUA). This EUA will remain  in effect (meaning this test can be used) for the duration of the COVID-19 declaration under Section 564(b)(1) of the Act, 21 U.S.C. section 360bbb-3(b)(1), unless the authorization is terminated or revoked sooner.    Influenza A by PCR NEGATIVE NEGATIVE   Influenza B by PCR NEGATIVE NEGATIVE    Comment: (NOTE) The Xpert Xpress SARS-CoV-2/FLU/RSV assay is intended as an aid in  the diagnosis of influenza from Nasopharyngeal swab specimens and  should not be used as a sole basis for treatment. Nasal washings and  aspirates are unacceptable for Xpert Xpress SARS-CoV-2/FLU/RSV  testing. Fact Sheet for Patients: PinkCheek.be Fact Sheet for Healthcare Providers: GravelBags.it This test is not yet approved or cleared by the [place] FDA and  has been authorized for detection and/or diagnosis of SARS-CoV-2 by  FDA under an Emergency Use Authorization (EUA). This EUA will remain  in effect (meaning this test can be used) for the duration of the  Covid-19 declaration under Section 564(b)(1) of the Act, 21  U.S.C. section 360bbb-3(b)(1), unless the authorization is  terminated or revoked. Performed at [facility], [place] [address], [place], [place] [address]     DG Hand Complete Left  Result Date:  10/21/2019 CLINICAL DATA:  Injury after dog pulled on leash EXAM: LEFT HAND - COMPLETE 3+ VIEW COMPARISON:  None. FINDINGS: Frontal, oblique, and lateral views were obtained. Overlying bandage at the level of the mid and distal aspects of the fifth digit. There is a fracture through the distal aspect of the fifth proximal phalanx. There is medial and dorsal angulation at the fracture site. No other fracture is evident. There is persistent flexion at the PIP joint without dislocation. There is slight osteoarthritic change in the first IP joint as well as in all PIP and DIP joints. No erosive changes. IMPRESSION: Fracture of the distal aspect of the fifth metacarpal with dorsal and medial angulation distally. No other fracture. Flexion at the fifth PIP joint without dislocation. 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 Electronically Signed   By: Lowella Grip III M.D.   On: 10/21/2019 15:35   DG MINI C-ARM IMAGE ONLY  Result Date: 10/21/2019 There is no interpretation for this exam.  This order is for images obtained during a surgical procedure.  Please See "Surgeries" Tab for more information regarding the procedure.    Assessment/Plan: Patient presents with a displaced transverse fracture of the proximal phalanx of the left small finger.  He has associated soft tissue injuries but tendons and nerves appear to be intact clinically.  Plan taken to the operating room for washout and pinning of the proximal phalanx.  Any other structures identified to be injured will be repaired.  I discussed this with him in detail.  We discussed the risks and benefits that include bleeding, infection, demonstrating structures, need for additional procedures.  We discussed nonunion, malunion, hardware complications such as infection.  I explained that the pins are need to be in for 3 to 4 weeks and then they would be removed in the office.  We will plan to send him home after the procedure is finished which  is his desire.  Cindra Presume 10/21/2019, 7:23 PM

## 2019-10-21 NOTE — Progress Notes (Signed)
Pt continues to have congestion and unable to clear throat. Pt still on O2 at 6L via simple Mask. MD consulted and new order received for NEB treatment.

## 2019-10-21 NOTE — Progress Notes (Signed)
NEB treatment completed and Pt appears less congested.

## 2019-10-21 NOTE — Brief Op Note (Signed)
10/21/2019  8:59 PM  PATIENT:  Dylan [name]  74 y.o. male  PRE-OPERATIVE DIAGNOSIS:  FINGER LACERATION  POST-OPERATIVE DIAGNOSIS:  Left small finger avulsion injury  PROCEDURE:  Procedure(s): ORIF PERCUTANEOUS PINNING OF SMALL FINGER, REPAIR TENDON  AND NERVE ARTERY AS  NECESSARY (Left)  SURGEON:  Surgeon(s) and Role:    * Ariea Rochin, Steffanie Dunn, MD - Primary  PHYSICIAN ASSISTANT: None  ASSISTANTS: none   ANESTHESIA:   general  EBL:  15 mL   BLOOD ADMINISTERED:none  DRAINS: none   LOCAL MEDICATIONS USED:  MARCAINE     SPECIMEN:  No Specimen  DISPOSITION OF SPECIMEN:  N/A  COUNTS:  YES  TOURNIQUET:   Total Tourniquet Time Documented: Upper Arm (Left) - 50 minutes Total: Upper Arm (Left) - 50 minutes   DICTATION: .Viviann Spare Dictation  PLAN OF CARE: Discharge to home after PACU  PATIENT DISPOSITION:  PACU - hemodynamically stable.   Delay start of Pharmacological VTE agent (>24hrs) due to surgical blood loss or risk of bleeding: not applicable

## 2019-10-21 NOTE — ED Provider Notes (Signed)
[facility] EMERGENCY DEPARTMENT Provider Note   CSN: [id number] Arrival date & time: 10/21/19  1459     History Chief Complaint  Patient presents with  . Extremity Laceration    Dylan [name] is a 74 y.o. male hx of anemia, CAD, CKD, diabetes who presented with left finger injury.  She states that she was walking his dog and the dog went chasing after a squirrel.  He states that he had a degloving injury of the left hand.  He states there is a lot of bleeding in the left fifth finger.  Denies other injuries.  Patient states that he cannot remember when his last tetanus shot was.  Is not currently on blood thinners.  Patient did not get his Covid shot yet.  Denies any fevers or chills or cough.  The history is provided by the patient.       Past Medical History:  Diagnosis Date  . Anemia 10/18/2014   Baseline about 12 and stable from 2010 to 2016. Colon 2009 in [place] (records cannot be obtained).  EGD Dr Benson Norway 2011 nl   . Chronic diastolic heart failure ([place]) 10/18/2014   Noted ECHO 10/2014. Grade 2. EF 50-55%   . Chronic renal failure, stage 3 (moderate) 10/18/2014   Baseline Cr about 1.5. Stable from 2010 to 2016.   Marland Kitchen Coronary artery disease   . Diverticulosis 10/08/2014   Seen on CT. Reportedly on Colon in [place] in 2009. Freq bouts of diverticulitis.  . DM (diabetes mellitus), type 2 with renal complications ([place]) 09/13/2503  . Essential hypertension 10/09/2014   Poor control with 5 drug therapy.   . Former tobacco use 10/08/2014  . Gout   . OSA on CPAP 10/08/2014  . Pulmonary HTN ([place]) 10/18/2014   Noted as severe ECHO 2016. Likely 2/2 severe OSA.    Patient Active Problem List   Diagnosis Date Noted  . Rectal bleeding 01/21/2019  . Coronary artery disease 04/04/2018  . Chronic pancreatitis ([place]) 11/10/2017  . Thrombocytopenia ([place]) 10/09/2016  . Allergic rhinitis 06/15/2015  . 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 Atherosclerosis of native arteries of extremity with  intermittent claudication ([place]) 12/28/2014  . Healthcare maintenance 12/12/2014  . Gout 10/19/2014  . 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 Anemia 10/18/2014  . Morbid obesity ([place]) 10/18/2014  . Pulmonary HTN ([place]) 10/18/2014  . Chronic diastolic heart failure ([place]) 10/18/2014  . Hypertension associated with diabetes ([place]) 10/09/2014  . OSA (obstructive sleep apnea) 10/08/2014  . Former tobacco use 10/08/2014  . Diverticulosis 10/08/2014  . DM (diabetes mellitus), type 2 with renal complications ([place]) 39/76/7341    Past Surgical History:  Procedure Laterality Date  . CHOLECYSTECTOMY    . COLONOSCOPY WITH PROPOFOL N/A 02/12/2019   Procedure: COLONOSCOPY WITH PROPOFOL;  Surgeon: Carol Ada, MD;  Location: [facility] ENDOSCOPY;  Service: Endoscopy;  Laterality: N/A;       Family History  Problem Relation Age of Onset  . Heart failure Sister        Died of complications Nov 9379  . CAD Sister        CABG x3 while in her 19's    Social History   Tobacco Use  . Smoking status: Former Smoker    Quit date: 10/02/1972    Years since quitting: 47.0  . Smokeless tobacco: Never Used  Substance Use Topics  . Alcohol use: No    Alcohol/week: 0.0 standard drinks  .  Drug use: No    Home Medications Prior to Admission medications   Medication Sig Start Date End Date Taking? Authorizing Provider  allopurinol (ZYLOPRIM) 100 MG tablet Take 1 tablet (100 mg total) by mouth daily. 08/05/19  Yes Bartholomew Crews, MD  aspirin (BAYER LOW DOSE) 81 MG EC tablet Take 81 mg by mouth daily. Swallow whole.   Yes [provider]  colchicine 0.6 MG tablet Take one pill by mouth daily for an acute gout flare until pain is gone. Patient taking differently: Take 0.6 mg by mouth See admin instructions. Take 0.6 mg by mouth once a day as directed for an acute gout flare until pain is gone 08/27/18  Yes Bartholomew Crews, MD  Dulaglutide (TRULICITY) 1.5  UQ/3.3HL SOPN Inject 1.5 mg into the skin once a week. 07/14/19  Yes Bartholomew Crews, MD  eplerenone (INSPRA) 50 MG tablet Take 1 tablet (50 mg total) by mouth 2 (two) times daily. 11/04/18  Yes Aldine Contes, MD  FREESTYLE LITE test strip Use to check blood sugar 4 times daily, at meals 3 times daily and at bedtime 06/02/19  Yes Bartholomew Crews, MD  hydrALAZINE (APRESOLINE) 50 MG tablet Take 2 tablets (100 mg total) by mouth 3 (three) times daily. 08/05/19  Yes Bartholomew Crews, MD  Insulin Pen Needle (PEN NEEDLES) 31G X 5 MM MISC Inject 1 Dose into the skin daily. With Victoza DIAG CODE E11.29. INSULIN DEPENDENT 07/12/19  Yes Bartholomew Crews, MD  ketotifen (ZADITOR) 0.025 % ophthalmic solution Place 1 drop into both eyes daily as needed (allergies).    Yes [provider]  labetalol (NORMODYNE) 200 MG tablet Take 2 tablets by mouth twice daily Patient taking differently: Take 400 mg by mouth 2 (two) times daily.  11/23/18  Yes Bartholomew Crews, MD  minoxidil (LONITEN) 10 MG tablet Take 2 tablets by mouth once daily Patient taking differently: Take 20 mg by mouth daily.  11/11/18  Yes Bartholomew Crews, MD  polyethylene glycol [facility] / GLYCOLAX) packet Take 17 g by mouth daily as needed (constipation).    Yes [provider]  torsemide (DEMADEX) 10 MG tablet Take 1 tablet in morning and 1 tablet in the evening. If your weight is up by 3lbs in 1 day, take another dose Patient taking differently: Take 10 mg by mouth See admin instructions. Take 1 tablet in morning and 1 tablet in the evening. If your weight is up by 3lbs in 1 day, take another dose 05/19/19  Yes Bartholomew Crews, MD  verapamil (VERELAN PM) 360 MG 24 hr capsule Take 1 capsule by mouth once daily Patient taking differently: Take 360 mg by mouth daily.  01/18/19  Yes Bartholomew Crews, MD  atorvastatin (LIPITOR) 40 MG tablet Take 1 tablet (40 mg total) by mouth daily. 05/06/19   Bartholomew Crews, MD    Allergies    Ace inhibitors and Spironolactone  Review of Systems   Review of Systems  Skin: Positive for wound.  All other systems reviewed and are negative.   Physical Exam Updated Vital Signs BP (!) 170/69   Pulse 66   Temp 98.7 F (37.1 C) (Oral)   Resp 18   Ht _0  (1.676 m)   Wt 90.7 kg   SpO2 (!) 86%   BMI 32.28 kg/m   Physical Exam Vitals and nursing note reviewed.  Constitutional:      Comments: Uncomfortable   HENT:  Head: Normocephalic.     Nose: Nose normal.  Cardiovascular:     Rate and Rhythm: Normal rate.     Pulses: Normal pulses.  Pulmonary:     Effort: Pulmonary effort is normal.  Abdominal:     General: Abdomen is flat.     Palpations: Abdomen is soft.  Musculoskeletal:     Comments: Small laceration to left second finger.  Left fourth finger is flexed and patient is unable to extend it.  There is degloving injury in the left fifth finger with obvious open fracture over the left PIP joint.  Patient has diminished capillary refill on the left fifth finger.  No other obvious left hand or wrist or forearm injury   Skin:    General: Skin is warm.     Capillary Refill: Capillary refill takes less than 2 seconds.  Neurological:     General: No focal deficit present.     Mental Status: He is alert.  Psychiatric:        Mood and Affect: Mood normal.        Behavior: Behavior normal.        ED Results / Procedures / Treatments   Labs (all labs ordered are listed, but only abnormal results are displayed) Labs Reviewed  CBC WITH DIFFERENTIAL/PLATELET - Abnormal; Notable for the following components:      Result Value   RBC 4.01 (*)    Hemoglobin 12.6 (*)    MCV 100.2 (*)    Monocytes Absolute 1.3 (*)    All other components within normal limits  BASIC METABOLIC PANEL - Abnormal; Notable for the following components:   Chloride 112 (*)    Glucose, Bld 138 (*)    BUN 24 (*)    Creatinine, Ser 1.76 (*)    GFR calc  non Af Amer 37 (*)    GFR calc Af Amer 43 (*)    All other components within normal limits  RESPIRATORY PANEL BY RT PCR (FLU A&B, COVID)  PROTIME-INR    EKG EKG Interpretation  Date/Time:  Thursday October 21 2019 18:52:41 EDT Ventricular Rate:  80 PR Interval:    QRS Duration: 155 QT Interval:  432 QTC Calculation: 499 R Axis:   37 Text Interpretation: Sinus rhythm Prolonged PR interval Probable left atrial enlargement Right bundle branch block Abnrm T, consider ischemia, anterolateral lds nonspecific changes since previous Confirmed by Wandra Arthurs [phone]) on 10/21/2019 6:54:43 PM   Radiology DG Hand Complete Left  Result Date: 10/21/2019 CLINICAL DATA:  Injury after dog pulled on leash EXAM: LEFT HAND - COMPLETE 3+ VIEW COMPARISON:  None. FINDINGS: Frontal, oblique, and lateral views were obtained. Overlying bandage at the level of the mid and distal aspects of the fifth digit. There is a fracture through the distal aspect of the fifth proximal phalanx. There is medial and dorsal angulation at the fracture site. No other fracture is evident. There is persistent flexion at the PIP joint without dislocation. There is slight osteoarthritic change in the first IP joint as well as in all PIP and DIP joints. No erosive changes. IMPRESSION: Fracture of the distal aspect of the fifth metacarpal with dorsal and medial angulation distally. No other fracture. Flexion at the fifth PIP joint without dislocation. 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 Electronically Signed   By: Lowella Grip III M.D.   On: 10/21/2019 15:35    Procedures Procedures (including critical care time)  Medications Ordered in ED Medications  ceFAZolin (ANCEF)  IVPB 2g/100 mL premix (0 g Intravenous Stopped 10/21/19 1755)  Tdap (BOOSTRIX) injection 0.5 mL (0.5 mLs Intramuscular Given 10/21/19 1724)  morphine 4 MG/ML injection 4 mg (4 mg Intravenous Given 10/21/19 1723)    ED Course  I have reviewed the  triage vital signs and the nursing notes.  Pertinent labs & imaging results that were available during my care of the patient were reviewed by me and considered in my medical decision making (see chart for details).    MDM Rules/Calculators/A&P                     MDM Number of Diagnoses or Management Options  TANYA MARVIN is a 74 y.o. male is here with left finger injury.  He did a degloving injury and is complicated and this is associated obvious open fracture.  He has no sensation to the left fifth finger and decreased capillary refill there.  Will consult hand surgery and get preop labs.  Additional history obtained:  Previous records obtained and reviewed  Lab Tests:  I Ordered, reviewed, and interpreted labs, which included:  CBC, CMP, Type, PT/INR  Imaging Studies ordered:  I ordered imaging studies which included L hand xray, I independently visualized and interpreted imaging which showed fracture of the left fifth metacarpal   Medicines ordered:  I ordered medication morphine, tdap, ancef For open fracture   Consultations Obtained: I consulted Dr. Claudia Desanctis and discussed lab and imaging findings.  He plans to take the patient to the OR.  6:55 PM X-ray showed left fifth metacarpal fracture.  Dr. Claudia Desanctis to take patient to the OR for washout and repair    Final Clinical Impression(s) / ED Diagnoses Final diagnoses:  Degloving injury    Rx / DC Orders ED Discharge Orders    None       Drenda Freeze, MD 10/21/19 [phone]

## 2019-10-21 NOTE — Discharge Instructions (Signed)
No heavy activities. Elevate arm to reduce swelling.  Diet: Regular  Wound Care: Keep dressing clean & dry.  Do not get splint wet.  You can shower but make sure to place a bag over the splint with a good seal to keep it dry.  Do not remove dressing.  Call doctor if any unusual problems occur such as pain, excessive bleeding, unrelieved nausea/vomiting, fever &/or chills.  Follow-up appointment: Should be seen by hand therapist for splint change within two weeks followed by appointment with Dr. Claudia Desanctis.

## 2019-10-21 NOTE — Transfer of Care (Signed)
Immediate Anesthesia Transfer of Care Note  Patient: Dylan [name]  Procedure(s) Performed: CLOSED REDUCTION WITH PERCUTANEOUS PINNING AND SUTURE REPAIR OF LACERATION  OF SMALL FINGER,  SUTURE REPAIR OF LACERATION OF INDEX FINGER (Left Finger)  Patient Location: PACU  Anesthesia Type:General  Level of Consciousness: awake  Airway & Oxygen Therapy: Patient Spontanous Breathing and Patient connected to face mask oxygen  Post-op Assessment: Report given to RN and Post -op Vital signs reviewed and stable  Post vital signs: Reviewed and stable  Last Vitals:  Vitals Value Taken Time  BP 135/66 10/21/19 2118  Temp    Pulse 77 10/21/19 2126  Resp 31 10/21/19 2126  SpO2 94 % 10/21/19 2126  Vitals shown include unvalidated device data.  Last Pain:  Vitals:   10/21/19 1854  TempSrc:   PainSc: 0-No pain         Complications: No apparent anesthesia complications

## 2019-10-21 NOTE — Op Note (Signed)
Operative Note   DATE OF OPERATION: 10/21/2019  SURGICAL DEPARTMENT: Plastic Surgery  PREOPERATIVE DIAGNOSES: Left small finger injury  POSTOPERATIVE DIAGNOSES:  same  PROCEDURE: 1.  Open reduction percutaneous pinning left small finger proximal phalanx fracture 2.  Complex repair of laceration to left small finger 3 cm in length 3.  Simple repair of laceration to left index finger 2 cm in length  SURGEON: Talmadge Coventry, MD  ASSISTANT: None  ANESTHESIA:  General.   COMPLICATIONS: None.   INDICATIONS FOR PROCEDURE:  The patient, Dylan [name] is a 74 y.o. male born on 12-06-1945, is here for treatment of injuries to left small finger MRN: [id number]  CONSENT:  Informed consent was obtained directly from the patient. Risks, benefits and alternatives were fully discussed. Specific risks including but not limited to bleeding, infection, hematoma, seroma, scarring, pain, contracture, asymmetry, wound healing problems, and need for further surgery were all discussed. The patient did have an ample opportunity to have questions answered to satisfaction.   DESCRIPTION OF PROCEDURE:  The patient was taken to the operating room. SCDs were placed and Ancef antibiotics were given.  General anesthesia was administered.  The patient's operative site was prepped and draped in a sterile fashion. A time out was performed and all information was confirmed to be correct.  I started by infiltrating the area with plain Marcaine.  The arm was exsanguinated with gravity and tourniquet inflated to 150 mmHg.  The left small finger laceration was explored.  Flexor and extensor tendons were intact.  The radial digital nerve appeared stretched but intact.  The ulnar digital bundle was not involved in the laceration and was not explored.  I was then able to identify the distal shaft proximal phalanx fracture and reduce it.  This was stabilized with 2 0.035 K wires.  Satisfactory reduction was maintained and  confirmed with fluoroscopy.  The laceration was then closed with interrupted chromic sutures.  The K wires were then bent and clipped and covered with pin caps.  Xeroform and 4 x 4's were placed over the incision covered with a soft wrap.  A ulnar gutter type volar splint was applied.  Tourniquet was let down and tourniquet time was 42 minutes.   The patient tolerated the procedure well.  There were no complications. The patient was allowed to wake from anesthesia, extubated and taken to the recovery room in satisfactory condition.

## 2019-10-21 NOTE — Anesthesia Procedure Notes (Signed)
Procedure Name: Intubation Date/Time: 10/21/2019 7:57 PM Performed by: Clovis Cao, CRNA Pre-anesthesia Checklist: Patient identified, Emergency Drugs available, Suction available, Patient being monitored and Timeout performed Patient Re-evaluated:Patient Re-evaluated prior to induction Oxygen Delivery Method: Circle system utilized Preoxygenation: Pre-oxygenation with 100% oxygen Induction Type: IV induction Ventilation: Mask ventilation without difficulty Laryngoscope Size: Mac and 3 Grade View: Grade I Tube type: Oral Tube size: 7.5 mm Number of attempts: 2 (DL x 1 by CRNA with Sabra Heck 2- Grade 4 view. Laceration noted to upper lip and chipped right front tooth.  Unable to determine if chipped area new. DL x 1 by MDA with Mac 3  and grade 1 view. ) Airway Equipment and Method: Stylet Placement Confirmation: ETT inserted through vocal cords under direct vision,  positive ETCO2 and breath sounds checked- equal and bilateral Secured at: 22 cm Tube secured with: Tape Dental Injury: Injury to lip

## 2019-10-21 NOTE — Interval H&P Note (Signed)
History and Physical Interval Note:  10/21/2019 7:27 PM  Dylan [name]  has presented today for surgery, with the diagnosis of FINGER LACERATION.  The various methods of treatment have been discussed with the patient and family. After consideration of risks, benefits and other options for treatment, the patient has consented to  Procedure(s): ORIF PERCUTANEOUS PINNING OF SMALL FINGER, REPAIR TENDON  AND NERVE ARTERY AS  NECESSARY (Left) as a surgical intervention.  The patient's history has been reviewed, patient examined, no change in status, stable for surgery.  I have reviewed the patient's chart and labs.  Questions were answered to the patient's satisfaction.     Cindra Presume

## 2019-10-21 NOTE — ED Triage Notes (Signed)
Pt presents to ED w/ lacerations to left hand after his hand got caught in the dog leash and the dog took off chasing a squirrel. Bleeding controlled in triage.

## 2019-10-22 LAB — GLUCOSE, CAPILLARY: Glucose-Capillary: 113 mg/dL — ABNORMAL HIGH (ref 70–99)

## 2019-10-22 NOTE — Progress Notes (Signed)
Pt has met criteria. Waiting for Dr Glennon Mac to do final assessment prior to discharge.

## 2019-10-22 NOTE — Anesthesia Postprocedure Evaluation (Signed)
Anesthesia Post Note  Patient: Dylan [name]  Procedure(s) Performed: CLOSED REDUCTION WITH PERCUTANEOUS PINNING AND SUTURE REPAIR OF LACERATION  OF SMALL FINGER,  SUTURE REPAIR OF LACERATION OF INDEX FINGER (Left Finger)     Patient location during evaluation: PACU Anesthesia Type: General Level of consciousness: awake and alert, oriented and patient cooperative Pain management: pain level controlled Vital Signs Assessment: post-procedure vital signs reviewed and stable Respiratory status: spontaneous breathing, nonlabored ventilation, respiratory function stable and patient connected to nasal cannula oxygen Cardiovascular status: blood pressure returned to baseline and stable Postop Assessment: no apparent nausea or vomiting Anesthetic complications: no Comments: Pt uses Home O2, understands to continue on O2 while sleeping tonight    Last Vitals:  Vitals:   10/22/19 0045 10/22/19 0100  BP:    Pulse: 73 80  Resp: 18 20  Temp:    SpO2: 94% 95%    Last Pain:  Vitals:   10/22/19 0100  TempSrc:   PainSc: 0-No pain                 Kasi Lasky,E. Candido Flott

## 2019-10-22 NOTE — Progress Notes (Signed)
Pt ready for discharge. Ok per Dr Glennon Mac to discharge Pt home and instruct Pt to wear his home O2 tonight.

## 2019-10-31 DIAGNOSIS — Z23 Encounter for immunization: Secondary | ICD-10-CM | POA: Diagnosis not present

## 2019-11-02 ENCOUNTER — Ambulatory Visit: Payer: Medicare Other | Attending: Plastic Surgery | Admitting: Occupational Therapy

## 2019-11-02 ENCOUNTER — Other Ambulatory Visit: Payer: Self-pay

## 2019-11-02 DIAGNOSIS — R600 Localized edema: Secondary | ICD-10-CM

## 2019-11-02 DIAGNOSIS — M25642 Stiffness of left hand, not elsewhere classified: Secondary | ICD-10-CM | POA: Insufficient documentation

## 2019-11-02 DIAGNOSIS — R208 Other disturbances of skin sensation: Secondary | ICD-10-CM

## 2019-11-02 DIAGNOSIS — M79642 Pain in left hand: Secondary | ICD-10-CM

## 2019-11-02 DIAGNOSIS — R209 Unspecified disturbances of skin sensation: Secondary | ICD-10-CM | POA: Diagnosis not present

## 2019-11-02 DIAGNOSIS — M6281 Muscle weakness (generalized): Secondary | ICD-10-CM | POA: Diagnosis not present

## 2019-11-02 NOTE — Therapy (Signed)
[facility] [address] [place], [name], [address] Phone: [phone]   Fax:  [phone]  Occupational Therapy Evaluation  Patient Details  Name: Dylan [name] MRN: [id number] Date of Birth: December 07, 1945 Referring Provider (OT): Dr. Claudia Desanctis   Encounter Date: 11/02/2019  OT End of Session - 11/02/19 1346    Visit Number  1    Number of Visits  17    Date for OT Re-Evaluation  01/02/20    Authorization Type  MCR    Authorization - Visit Number  1    Authorization - Number of Visits  10    Progress Note Due on Visit  10    OT Start Time  1230    OT Stop Time  1345    OT Time Calculation (min)  75 min    Activity Tolerance  Patient tolerated treatment well    Behavior During Therapy  [facility] for tasks assessed/performed       Past Medical History:  Diagnosis Date  . Anemia 10/18/2014   Baseline about 12 and stable from 2010 to 2016. Colon 2009 in [place] (records cannot be obtained).  EGD Dr Benson [place] 2011 nl   . Chronic diastolic heart failure ([place]) 10/18/2014   Noted ECHO 10/2014. Grade 2. EF 50-55%   . Chronic renal failure, stage 3 (moderate) 10/18/2014   Baseline Cr about 1.5. Stable from 2010 to 2016.   [place] Kitchen Coronary artery disease   . Diverticulosis 10/08/2014   Seen on CT. Reportedly on Colon in [place] in 2009. Freq bouts of diverticulitis.  . DM (diabetes mellitus), type 2 with renal complications ([place]) 12/14/6293  . Essential hypertension 10/09/2014   Poor control with 5 drug therapy.   . Former tobacco use 10/08/2014  . Gout   . OSA on CPAP 10/08/2014  . Pulmonary HTN ([place]) 10/18/2014   Noted as severe ECHO 2016. Likely 2/2 severe OSA.    Past Surgical History:  Procedure Laterality Date  . CHOLECYSTECTOMY    . COLONOSCOPY WITH PROPOFOL N/A 02/12/2019   Procedure: COLONOSCOPY WITH PROPOFOL;  Surgeon: Carol Ada, MD;  Location: [facility];  Service: Endoscopy;  Laterality: N/A;  . PERCUTANEOUS PINNING Left 10/21/2019   Procedure:  CLOSED REDUCTION WITH PERCUTANEOUS PINNING AND SUTURE REPAIR OF LACERATION  OF SMALL FINGER,  SUTURE REPAIR OF LACERATION OF INDEX FINGER;  Surgeon: Cindra Presume, MD;  Location: [facility];  Service: Plastics;  Laterality: Left;    There were no vitals filed for this visit.  Subjective Assessment - 11/02/19 1231    Subjective   This all happened from walking my dog    Pertinent History  Open reduction and percutaneous pinning Lt small finger following P1 fracture on 10/21/19 (degloving Lt small finger from walking dog). Pt also had laceration w/ few 2-3 stitches Lt index finger    Limitations  no ROM or strengthening at this time    Currently in Pain?  Yes    Pain Score  --   mild   Pain Location  Hand    Pain Orientation  Left    Pain Descriptors / Indicators  Aching    Pain Type  Acute pain;Surgical pain    Pain Onset  1 to 4 weeks ago    Pain Frequency  Intermittent    Aggravating Factors   trying to move hand    Pain Relieving Factors  rest, pain meds        [facility] OT Assessment - 11/02/19  0001      Assessment   Medical Diagnosis  Open reduction and percutaneous pinning Lt small finger P1 fracture    Referring Provider (OT)  Dr. Claudia Desanctis    Onset Date/Surgical Date  10/21/19    Hand Dominance  Right    Next MD Visit  11/04/19    Prior Therapy  none      Precautions   Precautions  Other (comment)    Precaution Comments  No ROM small and ring fingers and NO strengthening Lt hand      Home  Environment   Lives With  --   Ex-wife     Prior Function   Level of Independence  Independent    Vocation  Retired      Written Expression   Dominant Hand  Right               OT Treatments/Exercises (OP) - 11/02/19 0001      ADLs   ADL Comments  Pt arrived fully wrapped and protected. Carefully removed bandages and cleaned hand and pin area while keeping ring and small fingers immobolized. Extensively reviewed how to clean hand and pin sites and current precautions. Pt  instructed only able to move thumb, index, and long fingers w/ splint on.       Splinting   Splinting  Fabricated and fitted volar splint to include wrist, MP's, PIP's and DIP's of small and ring fingers per MD orders. Issued splint and reviewed wear/care            OT Education - 11/02/19 1336    Education Details  extensively reviewed splint wear and care, precautions, hygiene care and pin site care    Person(s) Educated  Patient    Methods  Explanation;Demonstration;Handout   highlighted most important info - pt reports ex-wife can assist him   Comprehension  Verbalized understanding       OT Short Term Goals - 11/02/19 1411      OT SHORT TERM GOAL #1   Title  Independent with splint wear and care and hygiene care including pin site care    Time  4    Period  Weeks    Status  On-going      OT SHORT TERM GOAL #2   Title  Independent with initial A/ROM HEP (once cleared)    Time  4    Period  Weeks    Status  New        OT Long Term Goals - 11/02/19 1414      OT LONG TERM GOAL #1   Title  Pt independent with updated HEP    Time  8    Period  Weeks    Status  New      OT LONG TERM GOAL #2   Title  Pt able to demo 90% of full composite flexion Lt small and ring fingers for grasping    Time  8    Period  Weeks    Status  New      OT LONG TERM GOAL #3   Title  Pt to demo 50% of grip strength Lt hand compared to Rt    Time  8    Period  Weeks    Status  New      OT LONG TERM GOAL #4   Title  Pt to use Lt hand as assist for light bilateral tasks/ADLS    Time  8    Period  Weeks  Status  New            Plan - 11/02/19 1405    Clinical Impression Statement  Pt is a 74 y.o. male who presents to OPOT for evaluation and splinting s/p open reduction and percutaneous pinning Lt small finger due to P1 fracture on 10/21/19. Pt's original injury from walking dog and leash caught on small finger. Pt presents today for splinting purposes and will need further  O.T. to address stiffness, ROM, strength, and pain Lt hand.    OT Occupational Profile and History  Problem Focused Assessment - Including review of records relating to presenting problem    Occupational performance deficits (Please refer to evaluation for details):  ADL's;IADL's;Leisure    Body Structure / Function / Physical Skills  ADL;ROM;IADL;Edema;Scar mobility;Sensation;Strength;Flexibility;Coordination;Pain;UE functional use    Rehab Potential  Good   Pt appears to be low education level   Clinical Decision Making  Several treatment options, min-mod task modification necessary    Comorbidities Affecting Occupational Performance:  May have comorbidities impacting occupational performance   see EPIC for PMH   OT Frequency  2x / week    OT Duration  8 weeks   however initially may only see 1x/wk due to current precautions   OT Treatment/Interventions  Self-care/ADL training;Therapeutic exercise;Ultrasound;Manual Therapy;Splinting;Therapeutic activities;Cryotherapy;Paraffin;DME and/or AE instruction;Compression bandaging;Fluidtherapy;Electrical Stimulation;Scar mobilization;Moist Heat;Passive range of motion;Patient/family education    Plan  splint adjustments prn, send update/questions to MD if appropriate for next visit    Consulted and Agree with Plan of Care  Patient       Patient will benefit from skilled therapeutic intervention in order to improve the following deficits and impairments:   Body Structure / Function / Physical Skills: ADL, ROM, IADL, Edema, Scar mobility, Sensation, Strength, Flexibility, Coordination, Pain, UE functional use       Visit Diagnosis: Stiffness of left hand, not elsewhere classified  Pain in left hand  Muscle weakness (generalized)  Localized edema  Other disturbances of skin sensation    Problem List Patient Active Problem List   Diagnosis Date Noted  . Rectal bleeding 01/21/2019  . Coronary artery disease 04/04/2018  . Chronic  pancreatitis ([place]) 11/10/2017  . Thrombocytopenia ([place]) 10/09/2016  . Allergic rhinitis 06/15/2015  . 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 Atherosclerosis 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 ([place]) 12/28/2014  . Healthcare maintenance 12/12/2014  . Gout 10/19/2014  . 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 Anemia 10/18/2014  . Morbid obesity ([place]) 10/18/2014  . Pulmonary HTN ([place]) 10/18/2014  . Chronic diastolic heart failure ([place]) 10/18/2014  . Hypertension associated with diabetes ([place]) 10/09/2014  . OSA (obstructive sleep apnea) 10/08/2014  . Former tobacco use 10/08/2014  . Diverticulosis 10/08/2014  . DM (diabetes mellitus), type 2 with renal complications ([place]) 41/75/3010    Carey Bullocks, OTR/L 11/02/2019, 2:17 PM  [facility] [address] [place], [name], [address] Phone: [phone]   Fax:  [phone]  Name: Dylan [name] MRN: [id number] Date of Birth: 05-15-1946

## 2019-11-02 NOTE — Patient Instructions (Addendum)
  WEARING SCHEDULE:  Wear splint at ALL times except for hygiene care. You can wiggle/move thumb and first two fingers only while in splint.  PURPOSE:  To prevent movement and for protection until injury can heal.  KEEP MIDDLE STRAP ACROSS PINS LOOSE, NOT TIGHT and be careful unstrapping and strapping.   CARE OF SPLINT:  Keep splint away from heat sources including: stove, radiator or furnace, or a car in sunlight. The splint can melt and will no longer fit you properly  Keep away from pets and children  Clean the splint with rubbing alcohol 2 times per day. Clean the base of your pins 2x/day by dipping Q-tips in hydrogen peroxide and cleaning that area.  * During this time, make sure you also clean your hand/arm as instructed by your therapist and/or perform dressing changes as needed. Then dry hand/arm completely before replacing splint. (When cleaning hand/arm, keep it immobilized in same position until splint is replaced). Change out finger sleeve/stockinette daily.   PRECAUTIONS/POTENTIAL PROBLEMS: *If you notice or experience increased pain, swelling, numbness, or a lingering reddened area from the splint: Contact your therapist immediately by calling [phone]. You must wear the splint for protection, but we will get you scheduled for adjustments as quickly as possible.  (If only straps or hooks need to be replaced and NO adjustments to the splint need to be made, just call the office ahead and let them know you are coming in)  If you have any medical concerns or signs of infection, please call your doctor immediately

## 2019-11-04 ENCOUNTER — Ambulatory Visit (INDEPENDENT_AMBULATORY_CARE_PROVIDER_SITE_OTHER): Payer: Medicare Other | Admitting: Plastic Surgery

## 2019-11-04 ENCOUNTER — Encounter: Payer: Self-pay | Admitting: Internal Medicine

## 2019-11-04 ENCOUNTER — Ambulatory Visit (INDEPENDENT_AMBULATORY_CARE_PROVIDER_SITE_OTHER): Payer: Medicare Other | Admitting: Internal Medicine

## 2019-11-04 ENCOUNTER — Other Ambulatory Visit (HOSPITAL_COMMUNITY): Payer: Medicare Other

## 2019-11-04 ENCOUNTER — Encounter: Payer: Self-pay | Admitting: Plastic Surgery

## 2019-11-04 ENCOUNTER — Other Ambulatory Visit: Payer: Self-pay

## 2019-11-04 ENCOUNTER — Ambulatory Visit (HOSPITAL_COMMUNITY)
Admission: RE | Admit: 2019-11-04 | Discharge: 2019-11-04 | Disposition: A | Discharge status: Home / Self Care | Payer: Medicare Other | Source: Ambulatory Visit | Attending: Plastic Surgery | Admitting: Plastic Surgery

## 2019-11-04 VITALS — BP 153/61 | HR 68 | Temp 98.4°F | Wt 201.0 lb

## 2019-11-04 VITALS — BP 145/57 | HR 79 | Temp 98.2°F | Ht 66.0 in | Wt 201.6 lb

## 2019-11-04 DIAGNOSIS — Z79899 Other long term (current) drug therapy: Secondary | ICD-10-CM | POA: Diagnosis not present

## 2019-11-04 DIAGNOSIS — E1159 Type 2 diabetes mellitus with other circulatory complications: Secondary | ICD-10-CM | POA: Diagnosis not present

## 2019-11-04 DIAGNOSIS — I130 Hypertensive heart and chronic kidney disease with heart failure and stage 1 through stage 4 chronic kidney disease, or unspecified chronic kidney disease: Secondary | ICD-10-CM | POA: Diagnosis not present

## 2019-11-04 DIAGNOSIS — S62307B Unspecified fracture of fifth metacarpal bone, left hand, initial encounter for open fracture: Secondary | ICD-10-CM | POA: Diagnosis not present

## 2019-11-04 DIAGNOSIS — Z0000 Encounter for general adult medical examination without abnormal findings: Secondary | ICD-10-CM | POA: Diagnosis not present

## 2019-11-04 DIAGNOSIS — S62617B Displaced fracture of proximal phalanx of left little finger, initial encounter for open fracture: Secondary | ICD-10-CM

## 2019-11-04 DIAGNOSIS — E1121 Type 2 diabetes mellitus with diabetic nephropathy: Secondary | ICD-10-CM

## 2019-11-04 DIAGNOSIS — I272 Pulmonary hypertension, unspecified: Secondary | ICD-10-CM | POA: Diagnosis not present

## 2019-11-04 DIAGNOSIS — E1122 Type 2 diabetes mellitus with diabetic chronic kidney disease: Secondary | ICD-10-CM

## 2019-11-04 DIAGNOSIS — S62617D Displaced fracture of proximal phalanx of left little finger, subsequent encounter for fracture with routine healing: Secondary | ICD-10-CM | POA: Diagnosis not present

## 2019-11-04 DIAGNOSIS — I152 Hypertension secondary to endocrine disorders: Secondary | ICD-10-CM

## 2019-11-04 LAB — GLUCOSE, CAPILLARY: Glucose-Capillary: 129 mg/dL — ABNORMAL HIGH (ref 70–99)

## 2019-11-04 LAB — POCT GLYCOSYLATED HEMOGLOBIN (HGB A1C): Hemoglobin A1C: 5.9 % — AB (ref 4.0–5.6)

## 2019-11-04 NOTE — Assessment & Plan Note (Signed)
This problem is chronic and uncontrolled.  His medication regimen is eplerenone 50 twice daily, hydralazine 50 2 pills 3 times daily, labetalol 200 2 pills twice daily, minoxidil 10 mg 2 pills once daily, torsemide 10 mg twice daily, and verapamil 300 daily.  He has had no known side effects to this medication.  His blood pressure is not at goal but he stated he recently saw Dr. Justin Mend and no medication changes were recommended.  I will obtain Dr. Jason Nest note and leave his medications currently as is.  PLAN:  Cont current meds   BP Readings from Last 3 Encounters:  11/04/19 (!) 145/57  11/04/19 (!) 153/61  10/22/19 (!) 170/76

## 2019-11-04 NOTE — Assessment & Plan Note (Signed)
This problem is chronic and stable.  He has oxygen that he uses as needed.  He has a pulse ox and if he is walking around a lot, he will get an O2 sat in the 80s and feel weak and use oxygen.

## 2019-11-04 NOTE — Patient Instructions (Signed)
1. I am sorry you hurt your fingers 2. I am glad your sugars are doing better

## 2019-11-04 NOTE — Progress Notes (Signed)
   Subjective:    Patient ID: Dylan [name], male    DOB: 02-17-1946, 74 y.o.   MRN: [id number]  HPI  NEVAAN BUNTON is here for DM F/U. Please see the A&P for the status of the pt's chronic medical problems.  ROS : per ROS section and in problem oriented charting. All other systems are negative.  PMHx, Soc hx, and / or Fam hx : He was walking his dog Lemon and had the leash wrapped around his left hand.  Lemon darted and the leash lacerated his L 5th and 2nd fingers and broke the 5th.  He has an appointment with his surgeon later today and had his first occupational therapy appointment yesterday.  Occupational Therapy indicated that they were going to do 8 weeks of OT.  Review of Systems     Objective:   Physical Exam        Assessment & Plan:

## 2019-11-04 NOTE — Progress Notes (Signed)
Patient presents 2 weeks postop from a left small finger closed reduction percutaneous pinning.  He also had a near circumferential laceration although I did not detect at the time of surgery any injury to the neurovascular structures.  He reports that everything seems to be going fine and has been to therapy for his removable splint.  He got an x-ray today which shows reasonably good alignment of the fractures and no signs of hardware complications.  On exam his laceration is healing fine.  He has a little bit of decreased sensation of the tip of the small finger compared to the ring finger but he can still feel it.  The tip of the finger appears well perfused and the alignment looks to be good externally.  We will plan to continue the pins in the splint for another 2 weeks and then I will see him in follow-up with an x-ray and consider pulling the pins.

## 2019-11-04 NOTE — Assessment & Plan Note (Signed)
This problem is chronic and controlled.  His A1c today is 6.  He is on Trulicity but does not know the dose.  I had increased it to 3 mg weekly at his last appointment due to fasting CBGs in the high 100s.  It was too high of a co-pay but I collaborated with our pharmacy friends and they were able to get pharmaceutical assistance and it is now mailed to his home.  His CBG download shows slightly improved CBGs.  They are ranging around the 150 mark which is similar to his self-reported average of 140. This is not exactly perfect control but it is acceptable for a 74 year old man with his comorbidities.  PLAN : cont current meds

## 2019-11-04 NOTE — Assessment & Plan Note (Signed)
This problem is chronic and stable.  He stated he has a 2 sisters on dialysis, one for 5 years and want to starting dialysis.  I brought up his creatinine graph and showed that his creatinine has been stable for the past 11 years.  His GFR is decreased in the 40s and microalbumin was slightly elevated.  Offloading is improved to prevent further decline in his renal function and we discussed controlling his diabetes, controlling his hypertension, avoiding nonsteroidals, taking his medications, and avoiding dehydration and I encouraged him to call the clinic if he was unable to stay hydrated.  PLAN:  Cont current meds

## 2019-11-04 NOTE — Assessment & Plan Note (Signed)
He got his first Covid vaccination on the 25th but does not have his appointment for his second shot.

## 2019-11-12 ENCOUNTER — Ambulatory Visit: Payer: Medicare Other | Attending: Plastic Surgery | Admitting: Occupational Therapy

## 2019-11-12 DIAGNOSIS — R209 Unspecified disturbances of skin sensation: Secondary | ICD-10-CM | POA: Insufficient documentation

## 2019-11-12 DIAGNOSIS — M25642 Stiffness of left hand, not elsewhere classified: Secondary | ICD-10-CM | POA: Insufficient documentation

## 2019-11-12 DIAGNOSIS — M6281 Muscle weakness (generalized): Secondary | ICD-10-CM | POA: Insufficient documentation

## 2019-11-12 DIAGNOSIS — M79642 Pain in left hand: Secondary | ICD-10-CM | POA: Insufficient documentation

## 2019-11-12 DIAGNOSIS — R600 Localized edema: Secondary | ICD-10-CM | POA: Insufficient documentation

## 2019-11-15 ENCOUNTER — Telehealth: Payer: Self-pay

## 2019-11-15 NOTE — Telephone Encounter (Signed)
Patient called to state that he thinks some sutures have come out of his finger, and he also notes a bad smell.

## 2019-11-15 NOTE — Telephone Encounter (Signed)
Patient would like to come in today. Forward message to [place] to call and schedule appointment

## 2019-11-16 ENCOUNTER — Ambulatory Visit (INDEPENDENT_AMBULATORY_CARE_PROVIDER_SITE_OTHER): Payer: Medicare Other | Admitting: Surgical

## 2019-11-16 ENCOUNTER — Encounter: Payer: Self-pay | Admitting: Surgical

## 2019-11-16 ENCOUNTER — Other Ambulatory Visit: Payer: Self-pay

## 2019-11-16 VITALS — BP 156/67 | HR 72 | Temp 98.7°F | Ht 66.0 in | Wt 200.0 lb

## 2019-11-16 DIAGNOSIS — S62617B Displaced fracture of proximal phalanx of left little finger, initial encounter for open fracture: Secondary | ICD-10-CM

## 2019-11-16 NOTE — Progress Notes (Signed)
Patient is a 74 year old male here for evaluation of his left small finger after closed reduction percutaneous pinning with Dr. Claudia Desanctis.  He reports that he is here today for evaluation of the laceration on the volar aspect.  He was worried about it being infected.  He believes he has an additional appointment in a week or so for evaluation of fracture alignment.  Other than the concern for the drainage on his gauze he is doing well.  He has not had any fevers or chills or nausea or vomiting.  On exam his laceration is healing well, he does have some maceration of the superficial epithelium.  He continues to have some decrease sensation of his left small finger compared to the other fingers.  No erythema, no drainage noted, no purulence, no fluctuance noted.  Normal temperature, color and cap refill of left small finger.  Xeroform placed to volar incision, recommend daily changes.  Tomorrow patient should remove dressing and allow it to air dry for approximately 30 minutes to decrease maceration.  Recommend daily dressing changes with Vaseline and gauze.  Call with any questions or concerns.  Patient has a follow-up for 11/25/2019 for possible removal of pins, he will also have a x-ray performed prior to his appointment.

## 2019-11-22 DIAGNOSIS — Z23 Encounter for immunization: Secondary | ICD-10-CM | POA: Diagnosis not present

## 2019-11-24 ENCOUNTER — Ambulatory Visit: Payer: Medicare Other | Admitting: Occupational Therapy

## 2019-11-25 ENCOUNTER — Ambulatory Visit (INDEPENDENT_AMBULATORY_CARE_PROVIDER_SITE_OTHER): Payer: Medicare Other | Admitting: Plastic Surgery

## 2019-11-25 ENCOUNTER — Other Ambulatory Visit: Payer: Self-pay | Admitting: Internal Medicine

## 2019-11-25 ENCOUNTER — Other Ambulatory Visit: Payer: Self-pay | Admitting: *Deleted

## 2019-11-25 ENCOUNTER — Encounter: Payer: Self-pay | Admitting: Plastic Surgery

## 2019-11-25 ENCOUNTER — Other Ambulatory Visit: Payer: Self-pay

## 2019-11-25 ENCOUNTER — Ambulatory Visit (HOSPITAL_COMMUNITY)
Admission: RE | Admit: 2019-11-25 | Discharge: 2019-11-25 | Disposition: A | Discharge status: Home / Self Care | Payer: Medicare Other | Source: Ambulatory Visit | Attending: Plastic Surgery | Admitting: Plastic Surgery

## 2019-11-25 VITALS — BP 129/85 | HR 65 | Temp 97.3°F | Ht 66.0 in | Wt 200.0 lb

## 2019-11-25 DIAGNOSIS — S62617B Displaced fracture of proximal phalanx of left little finger, initial encounter for open fracture: Secondary | ICD-10-CM | POA: Diagnosis not present

## 2019-11-25 DIAGNOSIS — M7989 Other specified soft tissue disorders: Secondary | ICD-10-CM | POA: Diagnosis not present

## 2019-11-25 NOTE — Progress Notes (Signed)
Patient is now 5 weeks postop from pinning of the small finger proximal phalanx fracture in near circumferential repair of the soft tissues.  He feels like he is doing well.  X-ray shows maintained alignment of the fracture with the pin still in place.  There appears to be some bridging across the fracture segment on lateral view.  His pins were removed today without issue.  He does have some superficial blistering and epidermal slough dorsally that I debrided today.  There is no purulence or sign of infection.  There is no exposed tendon.  At this point he can resume physical therapy cautiously.  He did have a heavily displaced proximal phalanx fracture with circumferential soft tissue injury.  I would like them to shorten his splint to no longer across the wrist.  We will start with active range of motion but hold off on passive range of motion strengthening for another 2 weeks.  I will plan to see him at 3 weeks with another x-ray of the left hand.

## 2019-11-25 NOTE — Telephone Encounter (Signed)
Need refill eplerenone (INSPRA) 50 MG tablet minoxidil (LONITEN) 10 MG tablet  allopurinol (ZYLOPRIM) 100 MG tablet  labetalol (NORMODYNE) 200 MG tablet ;pt contact [place], [place]

## 2019-11-27 ENCOUNTER — Other Ambulatory Visit: Payer: Self-pay | Admitting: Internal Medicine

## 2019-11-29 ENCOUNTER — Other Ambulatory Visit: Payer: Self-pay

## 2019-11-29 ENCOUNTER — Ambulatory Visit: Payer: Medicare Other | Admitting: Occupational Therapy

## 2019-11-29 ENCOUNTER — Other Ambulatory Visit: Payer: Self-pay | Admitting: Internal Medicine

## 2019-11-29 ENCOUNTER — Other Ambulatory Visit: Payer: Self-pay | Admitting: *Deleted

## 2019-11-29 DIAGNOSIS — R209 Unspecified disturbances of skin sensation: Secondary | ICD-10-CM | POA: Diagnosis not present

## 2019-11-29 DIAGNOSIS — M79642 Pain in left hand: Secondary | ICD-10-CM | POA: Diagnosis not present

## 2019-11-29 DIAGNOSIS — R600 Localized edema: Secondary | ICD-10-CM | POA: Diagnosis not present

## 2019-11-29 DIAGNOSIS — M6281 Muscle weakness (generalized): Secondary | ICD-10-CM

## 2019-11-29 DIAGNOSIS — R208 Other disturbances of skin sensation: Secondary | ICD-10-CM

## 2019-11-29 DIAGNOSIS — I10 Essential (primary) hypertension: Secondary | ICD-10-CM

## 2019-11-29 DIAGNOSIS — M25642 Stiffness of left hand, not elsewhere classified: Secondary | ICD-10-CM

## 2019-11-29 MED ORDER — MINOXIDIL 10 MG PO TABS: 20.0000 mg | ORAL_TABLET | Freq: Every day | ORAL | 3 refills | Status: DC

## 2019-11-29 MED ORDER — LABETALOL HCL 200 MG PO TABS: 400.0000 mg | ORAL_TABLET | Freq: Two times a day (BID) | ORAL | 3 refills | Status: DC

## 2019-11-29 MED ORDER — EPLERENONE 50 MG PO TABS: 50.0000 mg | ORAL_TABLET | Freq: Two times a day (BID) | ORAL | 3 refills | Status: DC

## 2019-11-29 NOTE — Patient Instructions (Signed)
MP Flexion (Active Isolated)   Bend _small_____ finger at large knuckle, keeping other fingers straight. Do not bend tips. Repeat _10-15___ times. Do __4-6__ sessions per day.  AROM: PIP Flexion / Extension   Pinch bottom knuckle of __small______ finger of hand to prevent bending. Actively bend middle knuckle until stretch is felt. Hold __5__ seconds. Relax. Straighten finger as far as possible. Repeat __10-15__ times per set. Do _4-6___ sessions per day.   AROM: DIP Flexion / Extension   Pinch middle knuckle of __small______ finger of  hand to prevent bending. Bend end knuckle until stretch is felt. Hold _5___ seconds. Relax. Straighten finger as far as possible. Repeat _10-15___ times per set.  Do _4-6___ sessions per day.  AROM: Finger Flexion / Extension   Actively bend fingers of  hand. Start with knuckles furthest from palm, and slowly make a fist. Hold __5__ seconds. Relax. Then straighten fingers as far as possible. Repeat _10-15___ times per set.  Do _4-6___ sessions per day.  Copyright  VHI. All rights reserved.      Wear your splint when not exercising. Change your dressing daily, clean your pinky with saline and hand with soap and water. Let hand air dry x 1 hour then apply xeroform, topped with finger stockinette.

## 2019-11-29 NOTE — Therapy (Signed)
[facility] [address] [place], [name], [address] Phone: [phone]   Fax:  [phone]  Occupational Therapy Treatment  Patient Details  Name: Dylan [name] MRN: [id number] Date of Birth: [birth year] Referring Provider (OT): Dr. Claudia Desanctis   Encounter Date: 11/29/2019  OT End of Session - 11/29/19 1550    Visit Number  2    Number of Visits  17    Date for OT Re-Evaluation  01/02/20    Authorization Type  MCR    Authorization - Visit Number  2    Authorization - Number of Visits  10    Progress Note Due on Visit  10    OT Start Time  1404    OT Stop Time  1447    OT Time Calculation (min)  43 min    Activity Tolerance  Patient tolerated treatment well    Behavior During Therapy  [facility] for tasks assessed/performed       Past Medical History:  Diagnosis Date  . Anemia 10/18/2014   Baseline about 12 and stable from 2010 to 2016. Colon 2009 in [place] (records cannot be obtained).  EGD Dr Benson Norway 2011 nl   . Chronic diastolic heart failure ([place]) 10/18/2014   Noted ECHO 10/2014. Grade 2. EF 50-55%   . Chronic renal failure, stage 3 (moderate) 10/18/2014   Baseline Cr about 1.5. Stable from 2010 to 2016.   Marland Kitchen Coronary artery disease   . Diverticulosis 10/08/2014   Seen on CT. Reportedly on Colon in [place] in 2009. Freq bouts of diverticulitis.  . DM (diabetes mellitus), type 2 with renal complications ([place]) 8/0/0349  . Essential hypertension 10/09/2014   Poor control with 5 drug therapy.   . Former tobacco use 10/08/2014  . Gout   . OSA on CPAP 10/08/2014  . Pulmonary HTN ([place]) 10/18/2014   Noted as severe ECHO 2016. Likely 2/2 severe OSA.    Past Surgical History:  Procedure Laterality Date  . CHOLECYSTECTOMY    . COLONOSCOPY WITH PROPOFOL N/A 02/12/2019   Procedure: COLONOSCOPY WITH PROPOFOL;  Surgeon: Carol Ada, MD;  Location: [facility];  Service: Endoscopy;  Laterality: N/A;  . PERCUTANEOUS PINNING Left 10/21/2019   Procedure:  CLOSED REDUCTION WITH PERCUTANEOUS PINNING AND SUTURE REPAIR OF LACERATION  OF SMALL FINGER,  SUTURE REPAIR OF LACERATION OF INDEX FINGER;  Surgeon: Cindra Presume, MD;  Location: [facility];  Service: Plastics;  Laterality: Left;    There were no vitals filed for this visit.  Subjective Assessment - 11/29/19 1543    Subjective   Pt reports only mild pain    Pertinent History  Open reduction and percutaneous pinning Lt small finger following P1 fracture on 10/21/19 (degloving Lt small finger from walking dog). Pt also had laceration w/ few 2-3 stitches Lt index finger    Limitations  no ROM or strengthening at this time    Currently in Pain?  Yes    Pain Score  3     Pain Location  Finger (Comment which one)    Pain Orientation  Left    Pain Descriptors / Indicators  Aching    Pain Type  Surgical pain    Pain Onset  More than a month ago    Pain Frequency  Intermittent    Aggravating Factors   movement    Pain Relieving Factors  inactivity             Treatment: Pt's hand was cleaned with  soap and water and 5th digit was cleaned with saline. Xeroform was applied to open areas on 5th digit and it was topped with stockinette. Pt's dorsal wound is nearly closed however pt has a significant open wound on volar 5th digit. Therapist cut down pt's splint to make it hand based after cleaning it thoroughly.  Pt was instructed in splint wear and care and initial A/ROM HEP for 5 th digit.               OT Education - 11/29/19 1607    Education Details  dressing changes, hand hygeine followed by splint wear, care and precautions, inital A/ROM HEP.    Person(s) Educated  Patient    Methods  Explanation;Demonstration;Handout   highlighted most important info - pt reports ex-wife can assist him   Comprehension  Verbalized understanding;Returned demonstration;Verbal cues required       OT Short Term Goals - 11/02/19 1411      OT SHORT TERM GOAL #1   Title  Independent with splint  wear and care and hygiene care including pin site care    Time  4    Period  Weeks    Status  On-going      OT SHORT TERM GOAL #2   Title  Independent with initial A/ROM HEP (once cleared)    Time  4    Period  Weeks    Status  New        OT Long Term Goals - 11/02/19 1414      OT LONG TERM GOAL #1   Title  Pt independent with updated HEP    Time  8    Period  Weeks    Status  New      OT LONG TERM GOAL #2   Title  Pt able to demo 90% of full composite flexion Lt small and ring fingers for grasping    Time  8    Period  Weeks    Status  New      OT LONG TERM GOAL #3   Title  Pt to demo 50% of grip strength Lt hand compared to Rt    Time  8    Period  Weeks    Status  New      OT LONG TERM GOAL #4   Title  Pt to use Lt hand as assist for light bilateral tasks/ADLS    Time  8    Period  Weeks    Status  New            Plan - 11/29/19 1552    Clinical Impression Statement  Pt arrived wearing splint ith 5 th digit wrapped. Pt has been cleared by MD for gentle A/ROM.    OT Occupational Profile and History  Problem Focused Assessment - Including review of records relating to presenting problem    Occupational performance deficits (Please refer to evaluation for details):  ADL's;IADL's;Leisure    Body Structure / Function / Physical Skills  ADL;ROM;IADL;Edema;Scar mobility;Sensation;Strength;Flexibility;Coordination;Pain;UE functional use    Rehab Potential  Good   Pt appears to be low education level   Clinical Decision Making  Several treatment options, min-mod task modification necessary    Comorbidities Affecting Occupational Performance:  May have comorbidities impacting occupational performance   see EPIC for PMH   OT Frequency  2x / week    OT Duration  8 weeks   however initially may only see 1x/wk due to current precautions  OT Treatment/Interventions  Self-care/ADL training;Therapeutic exercise;Ultrasound;Manual Therapy;Splinting;Therapeutic  activities;Cryotherapy;Paraffin;DME and/or AE instruction;Compression bandaging;Fluidtherapy;Electrical Stimulation;Scar mobilization;Moist Heat;Passive range of motion;Patient/family education    Plan  splint check, continue gentle A/ROM.    Consulted and Agree with Plan of Care  Patient       Patient will benefit from skilled therapeutic intervention in order to improve the following deficits and impairments:   Body Structure / Function / Physical Skills: ADL, ROM, IADL, Edema, Scar mobility, Sensation, Strength, Flexibility, Coordination, Pain, UE functional use       Visit Diagnosis: Pain in left hand  Muscle weakness (generalized)  Stiffness of left hand, not elsewhere classified  Other disturbances of skin sensation  Localized edema    Problem List Patient Active Problem List   Diagnosis Date Noted  . Rectal bleeding 01/21/2019  . Coronary artery disease 04/04/2018  . Chronic pancreatitis ([place]) 11/10/2017  . Thrombocytopenia ([place]) 10/09/2016  . Allergic rhinitis 06/15/2015  . 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 Atherosclerosis 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 ([place]) 12/28/2014  . Healthcare maintenance 12/12/2014  . Gout 10/19/2014  . 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 Anemia 10/18/2014  . Morbid obesity ([place]) 10/18/2014  . Pulmonary HTN ([place]) 10/18/2014  . Chronic diastolic heart failure ([place]) 10/18/2014  . Hypertension associated with diabetes ([place]) 10/09/2014  . OSA (obstructive sleep apnea) 10/08/2014  . Former tobacco use 10/08/2014  . Diverticulosis 10/08/2014  . DM (diabetes mellitus), type 2 with renal complications ([place]) 81/19/1478    Taygen Acklin 11/29/2019, 4:09 PM  [facility] [address] [place], [name], [address] Phone: [phone]   Fax:  [phone]  Name: VUK SKILLERN MRN: [id number] Date of Birth: [birth year]

## 2019-11-30 NOTE — Telephone Encounter (Signed)
Verified w/ pharmacy they had rec'd 3 scripts from dr butcher, they did not get the verapamil, gave it VO.

## 2019-11-30 NOTE — Telephone Encounter (Signed)
These have been done, pharm confirmed

## 2019-12-07 ENCOUNTER — Ambulatory Visit: Payer: Medicare Other | Attending: Plastic Surgery | Admitting: Occupational Therapy

## 2019-12-07 ENCOUNTER — Other Ambulatory Visit: Payer: Self-pay

## 2019-12-07 DIAGNOSIS — R600 Localized edema: Secondary | ICD-10-CM | POA: Insufficient documentation

## 2019-12-07 DIAGNOSIS — R209 Unspecified disturbances of skin sensation: Secondary | ICD-10-CM | POA: Diagnosis present

## 2019-12-07 DIAGNOSIS — M6281 Muscle weakness (generalized): Secondary | ICD-10-CM | POA: Insufficient documentation

## 2019-12-07 DIAGNOSIS — M79642 Pain in left hand: Secondary | ICD-10-CM | POA: Diagnosis not present

## 2019-12-07 DIAGNOSIS — M25642 Stiffness of left hand, not elsewhere classified: Secondary | ICD-10-CM | POA: Diagnosis present

## 2019-12-07 DIAGNOSIS — R208 Other disturbances of skin sensation: Secondary | ICD-10-CM

## 2019-12-08 ENCOUNTER — Encounter: Payer: Self-pay | Admitting: Occupational Therapy

## 2019-12-08 NOTE — Therapy (Signed)
[facility] [address] [place], [name], [address] Phone: [phone]   Fax:  [phone]  Occupational Therapy Treatment  Patient Details  Name: Dylan [name] MRN: [id number] Date of Birth: [birth year] Referring Provider (OT): Dr. Claudia Desanctis   Encounter Date: 12/07/2019  OT End of Session - 12/08/19 1731    Visit Number  3    Number of Visits  17    Date for OT Re-Evaluation  01/02/20    Authorization Type  MCR    Authorization - Visit Number  3    Authorization - Number of Visits  10    Progress Note Due on Visit  10    OT Start Time  1450    OT Stop Time  1525    OT Time Calculation (min)  35 min    Activity Tolerance  Patient tolerated treatment well    Behavior During Therapy  [facility] for tasks assessed/performed       Past Medical History:  Diagnosis Date  . Anemia 10/18/2014   Baseline about 12 and stable from 2010 to 2016. Colon 2009 in [place] (records cannot be obtained).  EGD Dr Benson Norway 2011 nl   . Chronic diastolic heart failure ([place]) 10/18/2014   Noted ECHO 10/2014. Grade 2. EF 50-55%   . Chronic renal failure, stage 3 (moderate) 10/18/2014   Baseline Cr about 1.5. Stable from 2010 to 2016.   Marland Kitchen Coronary artery disease   . Diverticulosis 10/08/2014   Seen on CT. Reportedly on Colon in [place] in 2009. Freq bouts of diverticulitis.  . DM (diabetes mellitus), type 2 with renal complications ([place]) 02/06/8002  . Essential hypertension 10/09/2014   Poor control with 5 drug therapy.   . Former tobacco use 10/08/2014  . Gout   . OSA on CPAP 10/08/2014  . Pulmonary HTN ([place]) 10/18/2014   Noted as severe ECHO 2016. Likely 2/2 severe OSA.    Past Surgical History:  Procedure Laterality Date  . CHOLECYSTECTOMY    . COLONOSCOPY WITH PROPOFOL N/A 02/12/2019   Procedure: COLONOSCOPY WITH PROPOFOL;  Surgeon: Carol Ada, MD;  [facility];  Service: Endoscopy;  Laterality: N/A;  . PERCUTANEOUS PINNING Left 10/21/2019   Procedure:  CLOSED REDUCTION WITH PERCUTANEOUS PINNING AND SUTURE REPAIR OF LACERATION  OF SMALL FINGER,  SUTURE REPAIR OF LACERATION OF INDEX FINGER;  Surgeon: Cindra Presume, MD;  Location: [facility];  Service: Plastics;  Laterality: Left;    There were no vitals filed for this visit.  Subjective Assessment - 12/07/19 1514    Subjective   Denies pain    Pertinent History  Open reduction and percutaneous pinning Lt small finger following P1 fracture on 10/21/19 (degloving Lt small finger from walking dog). Pt also had laceration w/ few 2-3 stitches Lt index finger    Limitations  cleared for A/R    Currently in Pain?  No/denies    Pain Onset  More than a month ago                 Treatment: Pt's hand was cleaned with soap and water and 5th digit was cleaned with saline. Xeroform was applied to open area on volar 5th digit and it was topped with stockinette. Pt's dorsal wound is appears to be closed and volar wound appears to be healing well. Reviewed Gentle A/ROM exercises:Composite flexion, MP flexion, PIP/ DIP flexion blocking exercises. Pt returned demonstration  Splint was cleaned and reapplied.  OT Short Term Goals - 12/08/19 1733      OT SHORT TERM GOAL #1   Title  Independent with splint wear and care and hygiene care including pin site care    Time  4    Period  Weeks    Status  Achieved      OT SHORT TERM GOAL #2   Title  Independent with initial A/ROM HEP (once cleared)    Time  4    Period  Weeks    Status  Achieved        OT Long Term Goals - 11/02/19 1414      OT LONG TERM GOAL #1   Title  Pt independent with updated HEP    Time  8    Period  Weeks    Status  New      OT LONG TERM GOAL #2   Title  Pt able to demo 90% of full composite flexion Lt small and ring fingers for grasping    Time  8    Period  Weeks    Status  New      OT LONG TERM GOAL #3   Title  Pt to demo 50% of grip strength Lt hand compared to Rt    Time  8    Period   Weeks    Status  New      OT LONG TERM GOAL #4   Title  Pt to use Lt hand as assist for light bilateral tasks/ADLS    Time  8    Period  Weeks    Status  New            Plan - 12/08/19 1732    Clinical Impression Statement  Pt arrived wearing splint ith 5 th digit wrapped. Pt's finger wound is healing and free of s/s of infection. Pt demonstrates improving A/ROM    OT Occupational Profile and History  Problem Focused Assessment - Including review of records relating to presenting problem    Occupational performance deficits (Please refer to evaluation for details):  ADL's;IADL's;Leisure    Body Structure / Function / Physical Skills  ADL;ROM;IADL;Edema;Scar mobility;Sensation;Strength;Flexibility;Coordination;Pain;UE functional use    Rehab Potential  Good   Pt appears to be low education level   Clinical Decision Making  Several treatment options, min-mod task modification necessary    Comorbidities Affecting Occupational Performance:  May have comorbidities impacting occupational performance   see EPIC for PMH   OT Frequency  2x / week    OT Duration  8 weeks   however initially may only see 1x/wk due to current precautions   OT Treatment/Interventions  Self-care/ADL training;Therapeutic exercise;Ultrasound;Manual Therapy;Splinting;Therapeutic activities;Cryotherapy;Paraffin;DME and/or AE instruction;Compression bandaging;Fluidtherapy;Electrical Stimulation;Scar mobilization;Moist Heat;Passive range of motion;Patient/family education    Plan  splint check, continue gentle A/ROM until pt sees MD again    Consulted and Agree with Plan of Care  Patient       Patient will benefit from skilled therapeutic intervention in order to improve the following deficits and impairments:   Body Structure / Function / Physical Skills: ADL, ROM, IADL, Edema, Scar mobility, Sensation, Strength, Flexibility, Coordination, Pain, UE functional use       Visit Diagnosis: Pain in left  hand  Muscle weakness (generalized)  Stiffness of left hand, not elsewhere classified  Other disturbances of skin sensation  Localized edema    Problem List Patient Active Problem List   Diagnosis Date Noted  . Rectal bleeding 01/21/2019  . Coronary  artery disease 04/04/2018  . Chronic pancreatitis ([place]) 11/10/2017  . Thrombocytopenia ([place]) 10/09/2016  . Allergic rhinitis 06/15/2015  . 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 Atherosclerosis 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 ([place]) 12/28/2014  . Healthcare maintenance 12/12/2014  . Gout 10/19/2014  . 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 Anemia 10/18/2014  . Morbid obesity ([place]) 10/18/2014  . Pulmonary HTN ([place]) 10/18/2014  . Chronic diastolic heart failure ([place]) 10/18/2014  . Hypertension associated with diabetes ([place]) 10/09/2014  . OSA (obstructive sleep apnea) 10/08/2014  . Former tobacco use 10/08/2014  . Diverticulosis 10/08/2014  . DM (diabetes mellitus), type 2 with renal complications ([place]) 52/71/2929    Waldon Sheerin 12/08/2019, 5:34 PM  [facility] [address] [place], [name], [address] Phone: [phone]   Fax:  [phone]  Name: Dylan [name] MRN: [id number] Date of Birth: 08-09-1945

## 2019-12-09 ENCOUNTER — Ambulatory Visit: Payer: Medicare Other | Admitting: Occupational Therapy

## 2019-12-14 ENCOUNTER — Ambulatory Visit: Payer: Medicare Other | Admitting: Occupational Therapy

## 2019-12-17 ENCOUNTER — Other Ambulatory Visit: Payer: Self-pay

## 2019-12-17 ENCOUNTER — Encounter: Payer: Self-pay | Admitting: Occupational Therapy

## 2019-12-17 ENCOUNTER — Ambulatory Visit: Payer: Medicare Other | Admitting: Occupational Therapy

## 2019-12-17 DIAGNOSIS — R208 Other disturbances of skin sensation: Secondary | ICD-10-CM

## 2019-12-17 DIAGNOSIS — M6281 Muscle weakness (generalized): Secondary | ICD-10-CM

## 2019-12-17 DIAGNOSIS — M79642 Pain in left hand: Secondary | ICD-10-CM

## 2019-12-17 DIAGNOSIS — M25642 Stiffness of left hand, not elsewhere classified: Secondary | ICD-10-CM

## 2019-12-17 NOTE — Therapy (Addendum)
[facility] [address] [place], [name], [address] Phone: [phone]   Fax:  [phone]  Occupational Therapy Treatment  Patient Details  Name: Dylan [name] MRN: [id number] Date of Birth: Jun 07, 1946 Referring Provider (OT): Dr. Claudia Desanctis   Encounter Date: 12/17/2019   OT End of Session - 12/17/19 1502    Visit Number 4    Number of Visits 17    Date for OT Re-Evaluation 01/02/20    Authorization Type MCR    Authorization - Visit Number 4    Authorization - Number of Visits 10    Progress Note Due on Visit 10    OT Start Time 7366    OT Stop Time 1315   2 units only, Hot pack   OT Time Calculation (min) 40 min    Equipment Utilized During Treatment hotpack    Activity Tolerance Patient tolerated treatment well    Behavior During Therapy [facility] for tasks assessed/performed           Past Medical History:  Diagnosis Date  . Anemia 10/18/2014   Baseline about 12 and stable from 2010 to 2016. Colon 2009 in [place] (records cannot be obtained).  EGD Dr Benson Norway 2011 nl   . Chronic diastolic heart failure ([place]) 10/18/2014   Noted ECHO 10/2014. Grade 2. EF 50-55%   . Chronic renal failure, stage 3 (moderate) 10/18/2014   Baseline Cr about 1.5. Stable from 2010 to 2016.   Marland Kitchen Coronary artery disease   . Diverticulosis 10/08/2014   Seen on CT. Reportedly on Colon in [place] in 2009. Freq bouts of diverticulitis.  . DM (diabetes mellitus), type 2 with renal complications ([place]) 02/05/5946  . Essential hypertension 10/09/2014   Poor control with 5 drug therapy.   . Former tobacco use 10/08/2014  . Gout   . OSA on CPAP 10/08/2014  . Pulmonary HTN ([place]) 10/18/2014   Noted as severe ECHO 2016. Likely 2/2 severe OSA.    Past Surgical History:  Procedure Laterality Date  . CHOLECYSTECTOMY    . COLONOSCOPY WITH PROPOFOL N/A 02/12/2019   Procedure: COLONOSCOPY WITH PROPOFOL;  Surgeon: Carol Ada, MD;  [facility];  Service: Endoscopy;   Laterality: N/A;  . PERCUTANEOUS PINNING Left 10/21/2019   Procedure: CLOSED REDUCTION WITH PERCUTANEOUS PINNING AND SUTURE REPAIR OF LACERATION  OF SMALL FINGER,  SUTURE REPAIR OF LACERATION OF INDEX FINGER;  Surgeon: Cindra Presume, MD;  Location: [facility];  Service: Plastics;  Laterality: Left;    There were no vitals filed for this visit.   Subjective Assessment - 12/17/19 1244    Subjective  Denies pain    Pertinent History Open reduction and percutaneous pinning Lt small finger following P1 fracture on 10/21/19 (degloving Lt small finger from walking dog). Pt also had laceration w/ few 2-3 stitches Lt index finger    Limitations Pt previously cleared for A/ROM,  then on 12/17/19 after OT session was completed  -MD cleared pt for strenghtening if everything is going well, therapist to perform P/ROM next visit and will progress to strengthening over next several visits.    Currently in Pain? No/denies    Pain Onset --                Treatment: Pt arrived without his splint today he forgot to put it back on. Pt demonstrates significant stiffness in his 5 th digit. Pt reports he is only exercising 1x day. Pt's hand was washed gently with soap and  water as wound is now closed. Hand was dried thoroughly and then allowed to air dry. Hot pack to LUE x 9 mins  Due to stiffness. No adverse reactions. Finger stockinette applied to 5 th digit. A/ROM and AA/ROM composite flexion PIP and DIP blocking , reverse blocking exercises reviewed with pt., mod v.c for performance. Pt was instructed to continue using saline to clean 5 th digit and not to submerge his hand.  Pt was instructed to apply finger stockinette to 5 th digit.                 OT Education - 12/17/19 1500    Education Details hand hygine, dressing changes with stockinette only now -no xerofrom, reviewed A/ROM exercises and importance of performing frequently throughout the day, every 2 hours.    Person(s) Educated  Patient    Methods Explanation;Demonstration;Verbal cues   highlighted most important info - pt reports ex-wife can assist him   Comprehension Verbalized understanding;Returned demonstration;Verbal cues required;Tactile cues required            OT Short Term Goals - 12/08/19 1733      OT SHORT TERM GOAL #1   Title Independent with splint wear and care and hygiene care including pin site care    Time 4    Period Weeks    Status Achieved      OT SHORT TERM GOAL #2   Title Independent with initial A/ROM HEP (once cleared)    Time 4    Period Weeks    Status Achieved             OT Long Term Goals - 11/02/19 1414      OT LONG TERM GOAL #1   Title Pt independent with updated HEP    Time 8    Period Weeks    Status New      OT LONG TERM GOAL #2   Title Pt able to demo 90% of full composite flexion Lt small and ring fingers for grasping    Time 8    Period Weeks    Status New      OT LONG TERM GOAL #3   Title Pt to demo 50% of grip strength Lt hand compared to Rt    Time 8    Period Weeks    Status New      OT LONG TERM GOAL #4   Title Pt to use Lt hand as assist for light bilateral tasks/ADLS    Time 8    Period Weeks    Status New                 Plan - 12/17/19 1247    Clinical Impression Statement Pt arrived without his splint today. Pt reports he forgot to put it back on after his shower. Pt's finger wound is now fully closed and healing with scab in place, no s/s of infection.    OT Occupational Profile and History Problem Focused Assessment - Including review of records relating to presenting problem    Occupational performance deficits (Please refer to evaluation for details): ADL's;IADL's;Leisure    Body Structure / Function / Physical Skills ADL;ROM;IADL;Edema;Scar mobility;Sensation;Strength;Flexibility;Coordination;Pain;UE functional use    Rehab Potential Good   Pt appears to be low education level   Clinical Decision Making Several treatment  options, min-mod task modification necessary    Comorbidities Affecting Occupational Performance: May have comorbidities impacting occupational performance   see EPIC for PMH   OT  Frequency 2x / week    OT Duration 8 weeks   however initially may only see 1x/wk due to current precautions   OT Treatment/Interventions Self-care/ADL training;Therapeutic exercise;Ultrasound;Manual Therapy;Splinting;Therapeutic activities;Cryotherapy;Paraffin;DME and/or AE instruction;Compression bandaging;Fluidtherapy;Electrical Stimulation;Scar mobilization;Moist Heat;Passive range of motion;Patient/family education    Plan A/ROM, AA/ROM begin gentle P/ROM next visit, (hold off on strengthening to see how pt does with P/ROM),dressing change/hand hygeine    Consulted and Agree with Plan of Care Patient           Patient will benefit from skilled therapeutic intervention in order to improve the following deficits and impairments:   Body Structure / Function / Physical Skills: ADL, ROM, IADL, Edema, Scar mobility, Sensation, Strength, Flexibility, Coordination, Pain, UE functional use       Visit Diagnosis: Pain in left hand  Muscle weakness (generalized)  Stiffness of left hand, not elsewhere classified  Other disturbances of skin sensation    Problem List Patient Active Problem List   Diagnosis Date Noted  . Rectal bleeding 01/21/2019  . Coronary artery disease 04/04/2018  . Chronic pancreatitis ([place]) 11/10/2017  . Thrombocytopenia ([place]) 10/09/2016  . Allergic rhinitis 06/15/2015  . 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 Atherosclerosis 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 ([place]) 12/28/2014  . Healthcare maintenance 12/12/2014  . Gout 10/19/2014  . 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 Anemia 10/18/2014  . Morbid obesity ([place]) 10/18/2014  . Pulmonary HTN ([place]) 10/18/2014  . Chronic diastolic heart failure ([place]) 10/18/2014  . Hypertension  associated with diabetes ([place]) 10/09/2014  . OSA (obstructive sleep apnea) 10/08/2014  . Former tobacco use 10/08/2014  . Diverticulosis 10/08/2014  . DM (diabetes mellitus), type 2 with renal complications ([place]) 08/07/4386    Deloris Mittag 12/17/2019, 3:16 PM Theone Murdoch, OTR/L [phone] Phone: [phone] 3:16 PM 06/11/21[facility] [address] [place], [name], [address] Phone: [phone]   Fax:  [phone]  Name: Dylan [name] MRN: [id number] Date of Birth: 03-08-1946

## 2019-12-20 ENCOUNTER — Other Ambulatory Visit: Payer: Self-pay | Admitting: Internal Medicine

## 2019-12-20 MED ORDER — TRULICITY 1.5 MG/0.5ML ~~LOC~~ SOAJ: 1.5000 mg | SUBCUTANEOUS | 1 refills | Status: DC

## 2019-12-20 NOTE — Telephone Encounter (Signed)
Need refill on diabetic medicine; pt contact [place], [place]

## 2019-12-22 ENCOUNTER — Ambulatory Visit: Payer: Medicare Other | Admitting: Occupational Therapy

## 2019-12-22 ENCOUNTER — Encounter: Payer: Self-pay | Admitting: Occupational Therapy

## 2019-12-22 ENCOUNTER — Other Ambulatory Visit: Payer: Self-pay

## 2019-12-22 DIAGNOSIS — R208 Other disturbances of skin sensation: Secondary | ICD-10-CM

## 2019-12-22 DIAGNOSIS — M6281 Muscle weakness (generalized): Secondary | ICD-10-CM

## 2019-12-22 DIAGNOSIS — M79642 Pain in left hand: Secondary | ICD-10-CM

## 2019-12-22 DIAGNOSIS — M25642 Stiffness of left hand, not elsewhere classified: Secondary | ICD-10-CM

## 2019-12-22 DIAGNOSIS — R600 Localized edema: Secondary | ICD-10-CM

## 2019-12-22 NOTE — Therapy (Signed)
Dylan [name] [address] [place], [name], [address] Phone: [phone]   Fax:  [phone]  Occupational Therapy Treatment  Patient Details  Name: Dylan [name] MRN: [id number] Date of Birth: 04-15-1946 Referring Provider (OT): Dr. Claudia [name]   Encounter Date: 12/22/2019   OT End of Session - 12/22/19 1442    Visit Number 5    Number of Visits 17    Date for OT Re-Evaluation 01/02/20    Authorization Type MCR    Authorization - Visit Number 5    Authorization - Number of Visits 10    Progress Note Due on Visit 10    OT Start Time 1400    OT Stop Time 1438    OT Time Calculation (min) 38 min    Equipment Utilized During Treatment hotpack    Activity Tolerance Patient tolerated treatment well    Behavior During Therapy [facility] for tasks assessed/performed           Past Medical History:  Diagnosis Date  . Anemia 10/18/2014   Baseline about 12 and stable from 2010 to 2016. Colon 2009 in [place] (records cannot be obtained).  EGD Dr Benson Norway 2011 nl   . Chronic diastolic heart failure ([place]) 10/18/2014   Noted ECHO 10/2014. Grade 2. EF 50-55%   . Chronic renal failure, stage 3 (moderate) 10/18/2014   Baseline Cr about 1.5. Stable from 2010 to 2016.   Marland Kitchen Coronary artery disease   . Diverticulosis 10/08/2014   Seen on CT. Reportedly on Colon in [place] in 2009. Freq bouts of diverticulitis.  . DM (diabetes mellitus), type 2 with renal complications ([place]) 08/15/3660  . Essential hypertension 10/09/2014   Poor control with 5 drug therapy.   . Former tobacco use 10/08/2014  . Gout   . OSA on CPAP 10/08/2014  . Pulmonary HTN ([place]) 10/18/2014   Noted as severe ECHO 2016. Likely 2/2 severe OSA.    Past Surgical History:  Procedure Laterality Date  . CHOLECYSTECTOMY    . COLONOSCOPY WITH PROPOFOL N/A 02/12/2019   Procedure: COLONOSCOPY WITH PROPOFOL;  Surgeon: Carol Ada, MD;  Location: [facility];  Service: Endoscopy;  Laterality: N/A;  .  PERCUTANEOUS PINNING Left 10/21/2019   Procedure: CLOSED REDUCTION WITH PERCUTANEOUS PINNING AND SUTURE REPAIR OF LACERATION  OF SMALL FINGER,  SUTURE REPAIR OF LACERATION OF INDEX FINGER;  Surgeon: Cindra Presume, MD;  Location: [place];  Service: Plastics;  Laterality: Left;    There were no vitals filed for this visit.   Subjective Assessment - 12/22/19 1441    Currently in Pain? No/denies    Pain Score 0-No pain                        OT Treatments/Exercises (OP) - 12/22/19 0001      Exercises   Exercises Hand      Hand Exercises   MCPJ Flexion PROM    MCPJ Flexion Limitations 60    PIPJ Flexion PROM    PIPJ Flexion Limitations 55    DIPJ Flexion PROM    DIPJ Flexion Limitations 30      Modalities   Modalities Moist Heat      Moist Heat Therapy   Number Minutes Moist Heat 10 Minutes    Moist Heat Location Hand                  OT Education - 12/22/19 1440    Education Details Reviewed  AROM exercises, reinforced blocking exercises    Person(s) Educated Patient    Methods Explanation;Demonstration;Verbal cues    Comprehension Verbalized understanding;Returned demonstration            OT Short Term Goals - 12/08/19 1733      OT SHORT TERM GOAL #1   Title Independent with splint wear and care and hygiene care including pin site care    Time 4    Period Weeks    Status Achieved      OT SHORT TERM GOAL #2   Title Independent with initial A/ROM HEP (once cleared)    Time 4    Period Weeks    Status Achieved             OT Long Term Goals - 11/02/19 1414      OT LONG TERM GOAL #1   Title Pt independent with updated HEP    Time 8    Period Weeks    Status New      OT LONG TERM GOAL #2   Title Pt able to demo 90% of full composite flexion Lt small and ring fingers for grasping    Time 8    Period Weeks    Status New      OT LONG TERM GOAL #3   Title Pt to demo 50% of grip strength Lt hand compared to Rt    Time 8    Period  Weeks    Status New      OT LONG TERM GOAL #4   Title Pt to use Lt hand as assist for light bilateral tasks/ADLS    Time 8    Period Weeks    Status New                 Plan - 12/22/19 1442    Clinical Impression Statement Patient's small finger is closed and healing well.  Patient is not consistently completing blocking exercises stating - doc said I should go easy becasue I almost took my finger off. Reinforced the benefit of careful exercise to improve mobility tin LUE    OT Frequency 2x / week    OT Duration 8 weeks    OT Treatment/Interventions Self-care/ADL training;Therapeutic exercise;Ultrasound;Manual Therapy;Splinting;Therapeutic activities;Cryotherapy;Paraffin;DME and/or AE instruction;Compression bandaging;Fluidtherapy;Electrical Stimulation;Scar mobilization;Moist Heat;Passive range of motion;Patient/family education    Plan AROM, PROM (gentle) following heat, may start light strengthening - sees Dr Dylan [name] 6/17 - so check note.    OT Home Exercise Plan AROM, Blocking exercises    Consulted and Agree with Plan of Care Patient           Patient will benefit from skilled therapeutic intervention in order to improve the following deficits and impairments:           Visit Diagnosis: Pain in left hand  Muscle weakness (generalized)  Stiffness of left hand, not elsewhere classified  Other disturbances of skin sensation  Localized edema    Problem List Patient Active Problem List   Diagnosis Date Noted  . Rectal bleeding 01/21/2019  . Coronary artery disease 04/04/2018  . Chronic pancreatitis ([place]) 11/10/2017  . Thrombocytopenia ([place]) 10/09/2016  . Allergic rhinitis 06/15/2015  . 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 Atherosclerosis 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 ([place]) 12/28/2014  . Healthcare maintenance 12/12/2014  . Gout 10/19/2014  . 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 Anemia  10/18/2014  . Morbid obesity ([place]) 10/18/2014  . Pulmonary HTN (  [place]) 10/18/2014  . Chronic diastolic heart failure ([place]) 10/18/2014  . Hypertension associated with diabetes ([place]) 10/09/2014  . OSA (obstructive sleep apnea) 10/08/2014  . Former tobacco use 10/08/2014  . Diverticulosis 10/08/2014  . DM (diabetes mellitus), type 2 with renal complications ([place]) 02/54/2706    Mariah Milling, OTR/L 12/22/2019, 2:45 PM  [facility] [address] [place], [name], [address] Phone: [phone]   Fax:  [phone]  Name: Dylan [name] MRN: [id number] Date of Birth: 1946/04/24

## 2019-12-23 ENCOUNTER — Telehealth: Payer: Self-pay | Admitting: *Deleted

## 2019-12-23 ENCOUNTER — Encounter: Payer: Self-pay | Admitting: Plastic Surgery

## 2019-12-23 ENCOUNTER — Ambulatory Visit (INDEPENDENT_AMBULATORY_CARE_PROVIDER_SITE_OTHER): Payer: Medicare Other | Admitting: Plastic Surgery

## 2019-12-23 ENCOUNTER — Ambulatory Visit (HOSPITAL_COMMUNITY)
Admission: RE | Admit: 2019-12-23 | Discharge: 2019-12-23 | Disposition: A | Discharge status: Home / Self Care | Payer: Medicare Other | Source: Ambulatory Visit | Attending: Plastic Surgery | Admitting: Plastic Surgery

## 2019-12-23 VITALS — BP 145/62 | HR 54 | Temp 97.1°F | Ht 66.0 in | Wt 200.0 lb

## 2019-12-23 DIAGNOSIS — S62617B Displaced fracture of proximal phalanx of left little finger, initial encounter for open fracture: Secondary | ICD-10-CM | POA: Diagnosis not present

## 2019-12-23 DIAGNOSIS — S62307K Unspecified fracture of fifth metacarpal bone, left hand, subsequent encounter for fracture with nonunion: Secondary | ICD-10-CM | POA: Diagnosis not present

## 2019-12-23 NOTE — Telephone Encounter (Signed)
Patient has an enrollment for Trulicity 1.5 mg with [facility] thru 07/07/2020.  I requested a refill for him that will be delivered to patients home address on 12/29/2019.  He will receive 4 months worth of medication at that time.  Future refills may be requested by calling [facility] at [phone].

## 2019-12-23 NOTE — Progress Notes (Signed)
Patient presents 8 weeks postop from pinning of the small finger fracture of the proximal phalanx along with repair of the surrounding soft tissues.  He feels like he is doing great and has no complaints.  He denies any pain.  He feels like he is progressing in therapy.  I did get an x-ray which shows maintained alignment of the fracture 3 weeks out from pin removal.  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  It feels stable on exam.  It is not tender over the area of the fracture.  I think he should be fine to proceed with therapy.  He does not want to wear his splint anymore and has been taking it off quite a bit anyhow and so I think it is fine to discontinue that.  I will plan to see him again in 2 months to check his progress.  I did caution him to resume activity slowly so as not to stress across the fracture site.

## 2019-12-23 NOTE — Telephone Encounter (Signed)
Pt had called for a refill of trulicity, it was filled and sent to [facility]. Pt calls today and states he used to get it in the mail. Read thru chart, tried to call ashley coleman and colleen summe for pt and could not speak to either. Claiborne Billings, could you look into this for pt, he has enough for appr 1 week

## 2019-12-24 ENCOUNTER — Encounter: Payer: Self-pay | Admitting: Occupational Therapy

## 2019-12-24 ENCOUNTER — Ambulatory Visit: Payer: Medicare Other | Admitting: Occupational Therapy

## 2019-12-24 ENCOUNTER — Other Ambulatory Visit: Payer: Self-pay

## 2019-12-24 DIAGNOSIS — M79642 Pain in left hand: Secondary | ICD-10-CM | POA: Diagnosis not present

## 2019-12-24 DIAGNOSIS — M6281 Muscle weakness (generalized): Secondary | ICD-10-CM

## 2019-12-24 DIAGNOSIS — R600 Localized edema: Secondary | ICD-10-CM

## 2019-12-24 DIAGNOSIS — M25642 Stiffness of left hand, not elsewhere classified: Secondary | ICD-10-CM

## 2019-12-24 DIAGNOSIS — R208 Other disturbances of skin sensation: Secondary | ICD-10-CM

## 2019-12-24 NOTE — Therapy (Signed)
[facility] [address] [place], [name], [address] Phone: [phone]   Fax:  [phone]  Occupational Therapy Treatment  Patient Details  Name: Dylan [name] MRN: [id number] Date of Birth: 05/11/1946 Referring Provider (OT): Dr. Claudia Desanctis   Encounter Date: 12/24/2019   OT End of Session - 12/24/19 1029    Visit Number 6    Number of Visits 17    Date for OT Re-Evaluation 01/02/20    Authorization Type MCR    Authorization - Visit Number 6    Authorization - Number of Visits 10    Progress Note Due on Visit 10    OT Start Time 1021    OT Stop Time 1100    OT Time Calculation (min) 39 min    Equipment Utilized During Treatment hotpack    Activity Tolerance Patient tolerated treatment well    Behavior During Therapy [facility] for tasks assessed/performed           Past Medical History:  Diagnosis Date  . Anemia 10/18/2014   Baseline about 12 and stable from 2010 to 2016. Colon 2009 in [place] (records cannot be obtained).  EGD Dr Benson Norway 2011 nl   . Chronic diastolic heart failure ([place]) 10/18/2014   Noted ECHO 10/2014. Grade 2. EF 50-55%   . Chronic renal failure, stage 3 (moderate) 10/18/2014   Baseline Cr about 1.5. Stable from 2010 to 2016.   Marland Kitchen Coronary artery disease   . Diverticulosis 10/08/2014   Seen on CT. Reportedly on Colon in [place] in 2009. Freq bouts of diverticulitis.  . DM (diabetes mellitus), type 2 with renal complications ([place]) 0/0/8676  . Essential hypertension 10/09/2014   Poor control with 5 drug therapy.   . Former tobacco use 10/08/2014  . Gout   . OSA on CPAP 10/08/2014  . Pulmonary HTN ([place]) 10/18/2014   Noted as severe ECHO 2016. Likely 2/2 severe OSA.    Past Surgical History:  Procedure Laterality Date  . CHOLECYSTECTOMY    . COLONOSCOPY WITH PROPOFOL N/A 02/12/2019   Procedure: COLONOSCOPY WITH PROPOFOL;  Surgeon: Carol Ada, MD;  [facility];  Service: Endoscopy;  Laterality: N/A;  .  PERCUTANEOUS PINNING Left 10/21/2019   Procedure: CLOSED REDUCTION WITH PERCUTANEOUS PINNING AND SUTURE REPAIR OF LACERATION  OF SMALL FINGER,  SUTURE REPAIR OF LACERATION OF INDEX FINGER;  Surgeon: Cindra Presume, MD;  Location: [facility];  Service: Plastics;  Laterality: Left;    There were no vitals filed for this visit.   Subjective Assessment - 12/24/19 1022    Subjective  I think these exercises will help.  It feels better at the end (of the finger).  The doctor said to go easy with this finger, but he said I could stop wearing the "cast" (splint)    Pertinent History Open reduction and percutaneous pinning Lt small finger following P1 fracture on 10/21/19 (degloving Lt small finger from walking dog). Pt also had laceration w/ few 2-3 stitches Lt index finger    Limitations Pt previously cleared for A/ROM,  then on 12/17/19 after OT session was completed  -MD cleared pt for strenghtening if everything is going well, therapist to perform P/ROM next visit and will progress to strengthening over next several visits.    Currently in Pain? No/denies             Hot pack x 8 min to L hand with no adverse reactions for stiffness.  Followed by AROM to 5th  digit in composite flex/ext, MP flex/ext, isolated/blocked PIP flex/ext, isolated/blocked DIP flexion/ext.  Then PIP flex/ext with MP blocked in flex and isolated IP flex/ext.  Gentle PROM to DIP, PIP, MP, and in composite flex/ext with 0-3/10 pain reported initially.  Followed by place and holds in composite flexion.    Towel scrunches for 4-5th digit finger flex/ext with mod difficulty with 5th digit flex and full extension.  grasping medium and small cylinder objects for incr L hand functional use and 5th digit flex/ext.  5th digit appears to be healing well (lateral wound closed with scab in place), with dead skin/scabs on medial aspect of finger coming off during ROM (but skin closed underneath).  Applied clean finger stockinette at end os  session.  Encouraged pt to begin to use L hand for light functional tasks (like picking up cup), but nothing heavy.            OT Education - 12/24/19 1454    Education Details Gentle PROM HEP-see pt instructions    Person(s) Educated Patient    Methods Explanation;Demonstration;Verbal cues;Handout;Tactile cues    Comprehension Returned demonstration;Verbalized understanding;Verbal cues required;Need further instruction            OT Short Term Goals - 12/08/19 1733      OT SHORT TERM GOAL #1   Title Independent with splint wear and care and hygiene care including pin site care    Time 4    Period Weeks    Status Achieved      OT SHORT TERM GOAL #2   Title Independent with initial A/ROM HEP (once cleared)    Time 4    Period Weeks    Status Achieved             OT Long Term Goals - 11/02/19 1414      OT LONG TERM GOAL #1   Title Pt independent with updated HEP    Time 8    Period Weeks    Status New      OT LONG TERM GOAL #2   Title Pt able to demo 90% of full composite flexion Lt small and ring fingers for grasping    Time 8    Period Weeks    Status New      OT LONG TERM GOAL #3   Title Pt to demo 50% of grip strength Lt hand compared to Rt    Time 8    Period Weeks    Status New      OT LONG TERM GOAL #4   Title Pt to use Lt hand as assist for light bilateral tasks/ADLS    Time 8    Period Weeks    Status New                 Plan - 12/24/19 1029    Clinical Impression Statement Patient's small finger is closed and healing well with some scabs coming off with movement and others still in place.  Pt continues to be hesitant to move 5th digit and reports doctor told me to go easy.   Reinforced the benefit of careful exercise to improve mobility in LUE--issued gentle PROM HEP and pt performed with 0-3/10 pain, improved with repetition.  Recommended pt begin light use of L hand at home.    OT Frequency 2x / week    OT Duration 8 weeks    OT  Treatment/Interventions Self-care/ADL training;Therapeutic exercise;Ultrasound;Manual Therapy;Splinting;Therapeutic activities;Cryotherapy;Paraffin;DME and/or AE instruction;Compression bandaging;Fluidtherapy;Electrical Stimulation;Scar mobilization;Moist Heat;Passive  range of motion;Patient/family education    Plan AROM, PROM (gentle) following heat, initiate light strengthening (yellow putty)    OT Home Exercise Plan AROM, Blocking exercises    Consulted and Agree with Plan of Care Patient           Patient will benefit from skilled therapeutic intervention in order to improve the following deficits and impairments:           Visit Diagnosis: Pain in left hand  Muscle weakness (generalized)  Stiffness of left hand, not elsewhere classified  Other disturbances of skin sensation  Localized edema    Problem List Patient Active Problem List   Diagnosis Date Noted  . Rectal bleeding 01/21/2019  . Coronary artery disease 04/04/2018  . Chronic pancreatitis ([place]) 11/10/2017  . Thrombocytopenia ([place]) 10/09/2016  . Allergic rhinitis 06/15/2015  . 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 Atherosclerosis 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 ([place]) 12/28/2014  . Healthcare maintenance 12/12/2014  . Gout 10/19/2014  . 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 Anemia 10/18/2014  . Morbid obesity ([place]) 10/18/2014  . Pulmonary HTN ([place]) 10/18/2014  . Chronic diastolic heart failure ([place]) 10/18/2014  . Hypertension associated with diabetes ([place]) 10/09/2014  . OSA (obstructive sleep apnea) 10/08/2014  . Former tobacco use 10/08/2014  . Diverticulosis 10/08/2014  . DM (diabetes mellitus), type 2 with renal complications ([place]) 60/10/5995    [facility] 12/24/2019, 3:05 PM  [facility] [address] [place], [name], [address] Phone: [phone]   Fax:   [phone]  Name: Dylan [name] MRN: [id number] Date of Birth: 1945/12/04   Vianne Bulls, OTR/L [facility] [address] [place], [place]  [address] [phone] phone [phone] 12/24/19 3:05 PM

## 2019-12-24 NOTE — Patient Instructions (Signed)
   PIP Flexion (Passive)   Use other hand to bend the middle joint of ______ finger down as far as possible. Hold _10___ seconds. Repeat __10__ times. Do _4-6___ sessions per day.   MP / PIP / DIP Composite Flexion (Passive Stretch)    Use other hand to bend finger at all three joints. Hold 10 seconds. Repeat 10 times. Do 4-6 sessions per day.  Copyright  VHI. All rights reserved.

## 2019-12-27 ENCOUNTER — Encounter: Payer: Self-pay | Admitting: *Deleted

## 2019-12-28 ENCOUNTER — Encounter: Payer: 59 | Admitting: Occupational Therapy

## 2019-12-30 ENCOUNTER — Other Ambulatory Visit: Payer: Self-pay

## 2019-12-30 ENCOUNTER — Ambulatory Visit: Payer: Medicare Other | Admitting: Occupational Therapy

## 2019-12-30 DIAGNOSIS — M6281 Muscle weakness (generalized): Secondary | ICD-10-CM

## 2019-12-30 DIAGNOSIS — M79642 Pain in left hand: Secondary | ICD-10-CM | POA: Diagnosis not present

## 2019-12-30 DIAGNOSIS — R600 Localized edema: Secondary | ICD-10-CM

## 2019-12-30 DIAGNOSIS — M25642 Stiffness of left hand, not elsewhere classified: Secondary | ICD-10-CM

## 2019-12-30 NOTE — Patient Instructions (Signed)
PIP Extension (Passive    Use index of other hand on top of joint and thumb under- neath on tip either side to straighten middle joint of __small____ finger. Hold __10__ seconds. Relax  Repeat _5___ times. Do _4-6___ sessions per day. KEEP CLOSE TO RING FINGER    1. Grip Strengthening (Resistive Putty)   Squeeze putty using thumb and all fingers. Repeat _20___ times. Do __2__ sessions per day.   2. Roll putty into tube on table and pinch between each finger and thumb (Do ring and small finger together) x 10 reps each. Do 2 sessions per day

## 2019-12-30 NOTE — Therapy (Signed)
[facility] [address] [place], [name], [address] Phone: [phone]   Fax:  [phone]  Occupational Therapy Treatment  Patient Details  Name: Dylan [name] MRN: [id number] Date of Birth: 1946-07-02 Referring Provider (OT): Dr. Claudia Desanctis   Encounter Date: 12/30/2019   OT End of Session - 12/30/19 1521    Visit Number 7    Number of Visits 17    Date for OT Re-Evaluation 01/02/20    Authorization Type MCR    Authorization - Visit Number 7    Authorization - Number of Visits 10    Progress Note Due on Visit 10    OT Start Time 1400    OT Stop Time 1445    OT Time Calculation (min) 45 min    Activity Tolerance Patient tolerated treatment well    Behavior During Therapy [facility] for tasks assessed/performed           Past Medical History:  Diagnosis Date  . Anemia 10/18/2014   Baseline about 12 and stable from 2010 to 2016. Colon 2009 in [place] (records cannot be obtained).  EGD Dr Benson [place] 2011 nl   . Chronic diastolic heart failure ([place]) 10/18/2014   Noted ECHO 10/2014. Grade 2. EF 50-55%   . Chronic renal failure, stage 3 (moderate) 10/18/2014   Baseline Cr about 1.5. Stable from 2010 to 2016.   Marland Kitchen Coronary artery disease   . Diverticulosis 10/08/2014   Seen on CT. Reportedly on Colon in [place] in 2009. Freq bouts of diverticulitis.  . DM (diabetes mellitus), type 2 with renal complications ([place]) 09/07/2023  . Essential hypertension 10/09/2014   Poor control with 5 drug therapy.   . Former tobacco use 10/08/2014  . Gout   . OSA on CPAP 10/08/2014  . Pulmonary HTN ([place]) 10/18/2014   Noted as severe ECHO 2016. Likely 2/2 severe OSA.    Past Surgical History:  Procedure Laterality Date  . CHOLECYSTECTOMY    . COLONOSCOPY WITH PROPOFOL N/A 02/12/2019   Procedure: COLONOSCOPY WITH PROPOFOL;  Surgeon: Carol Ada, MD;  [facility];  Service: Endoscopy;  Laterality: N/A;  . PERCUTANEOUS PINNING Left 10/21/2019   Procedure: CLOSED  REDUCTION WITH PERCUTANEOUS PINNING AND SUTURE REPAIR OF LACERATION  OF SMALL FINGER,  SUTURE REPAIR OF LACERATION OF INDEX FINGER;  Surgeon: Cindra Presume, MD;  Location: [facility];  Service: Plastics;  Laterality: Left;    There were no vitals filed for this visit.   Subjective Assessment - 12/30/19 1404    Subjective  Soreness but no pain with stretching    Pertinent History Open reduction and percutaneous pinning Lt small finger following P1 fracture on 10/21/19 (degloving Lt small finger from walking dog). Pt also had laceration w/ few 2-3 stitches Lt index finger    Limitations Pt previously cleared for A/ROM,  then on 12/17/19 after OT session was completed  -MD cleared pt for strenghtening if everything is going well, therapist to perform P/ROM next visit and will progress to strengthening over next several visits.    Currently in Pain? No/denies          Performed A/ROM, P/ROM, and place and hold ex's and then extensively reviewed P/ROM HEP as pt not performing correctly. Issued finger extension stretch due to lag and may benefit from pm extension splint.  Issued putty HEP and pt return demo with cues - will need further reviewing.  Pt also issued buddy strap for small to ring finger d/t difficulty  maintaining proximity to ring finger and to assist with A/ROM. Pt instructed only to wear w/ A/ROM HEP and some during day prn, but to remove buddy strap for P/ROM and putty HEP.                       OT Education - 12/30/19 1431    Education Details Review of P/ROM HEP and added passive finger extension stretch, putty HEP    Person(s) Educated Patient    Methods Explanation;Demonstration;Verbal cues;Handout;Tactile cues    Comprehension Returned demonstration;Verbalized understanding;Verbal cues required;Need further instruction            OT Short Term Goals - 12/08/19 1733      OT SHORT TERM GOAL #1   Title Independent with splint wear and care and hygiene care  including pin site care    Time 4    Period Weeks    Status Achieved      OT SHORT TERM GOAL #2   Title Independent with initial A/ROM HEP (once cleared)    Time 4    Period Weeks    Status Achieved             OT Long Term Goals - 11/02/19 1414      OT LONG TERM GOAL #1   Title Pt independent with updated HEP    Time 8    Period Weeks    Status New      OT LONG TERM GOAL #2   Title Pt able to demo 90% of full composite flexion Lt small and ring fingers for grasping    Time 8    Period Weeks    Status New      OT LONG TERM GOAL #3   Title Pt to demo 50% of grip strength Lt hand compared to Rt    Time 8    Period Weeks    Status New      OT LONG TERM GOAL #4   Title Pt to use Lt hand as assist for light bilateral tasks/ADLS    Time 8    Period Weeks    Status New                 Plan - 12/30/19 1522    Clinical Impression Statement Pt progressing gradually with ROM - Pt limited PIP flexion and has extensor lag Lt small finger    Occupational performance deficits (Please refer to evaluation for details): ADL's;IADL's;Leisure    Body Structure / Function / Physical Skills ADL;ROM;IADL;Edema;Scar mobility;Sensation;Strength;Flexibility;Coordination;Pain;UE functional use    Rehab Potential Good    OT Frequency 2x / week    OT Duration 8 weeks    OT Treatment/Interventions Self-care/ADL training;Therapeutic exercise;Ultrasound;Manual Therapy;Splinting;Therapeutic activities;Cryotherapy;Paraffin;DME and/or AE instruction;Compression bandaging;Fluidtherapy;Electrical Stimulation;Scar mobilization;Moist Heat;Passive range of motion;Patient/family education    Plan continue aggressive ROM and light strengthening, may benefit from modalities ([place] for edema and scar management, estim w/ buddy strap on), also consider pm extension splint?, needs further reinforcement of putty HEP    Consulted and Agree with Plan of Care Patient           Patient will benefit from  skilled therapeutic intervention in order to improve the following deficits and impairments:   Body Structure / Function / Physical Skills: ADL, ROM, IADL, Edema, Scar mobility, Sensation, Strength, Flexibility, Coordination, Pain, UE functional use       Visit Diagnosis: Stiffness of left hand, not elsewhere classified  Muscle weakness (generalized)  Localized edema    Problem List Patient Active Problem List   Diagnosis Date Noted  . Rectal bleeding 01/21/2019  . Coronary artery disease 04/04/2018  . Chronic pancreatitis ([place]) 11/10/2017  . Thrombocytopenia ([place]) 10/09/2016  . Allergic rhinitis 06/15/2015  . 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 Atherosclerosis 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 ([place]) 12/28/2014  . Healthcare maintenance 12/12/2014  . Gout 10/19/2014  . 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 Anemia 10/18/2014  . Morbid obesity ([place]) 10/18/2014  . Pulmonary HTN ([place]) 10/18/2014  . Chronic diastolic heart failure ([place]) 10/18/2014  . Hypertension associated with diabetes ([place]) 10/09/2014  . OSA (obstructive sleep apnea) 10/08/2014  . Former tobacco use 10/08/2014  . Diverticulosis 10/08/2014  . DM (diabetes mellitus), type 2 with renal complications ([place]) 53/91/2258    Carey Bullocks, OTR/L 12/30/2019, 3:24 PM  [facility] [address] [place], [name], [address] Phone: [phone]   Fax:  [phone]  Name: Dylan [name] MRN: [id number] Date of Birth: September 13, 1945

## 2020-01-04 ENCOUNTER — Encounter: Payer: Self-pay | Admitting: Occupational Therapy

## 2020-01-04 ENCOUNTER — Other Ambulatory Visit: Payer: Self-pay

## 2020-01-04 ENCOUNTER — Ambulatory Visit: Payer: Medicare Other | Admitting: Occupational Therapy

## 2020-01-04 DIAGNOSIS — M25642 Stiffness of left hand, not elsewhere classified: Secondary | ICD-10-CM

## 2020-01-04 DIAGNOSIS — R600 Localized edema: Secondary | ICD-10-CM

## 2020-01-04 DIAGNOSIS — R208 Other disturbances of skin sensation: Secondary | ICD-10-CM

## 2020-01-04 DIAGNOSIS — M6281 Muscle weakness (generalized): Secondary | ICD-10-CM

## 2020-01-04 DIAGNOSIS — M79642 Pain in left hand: Secondary | ICD-10-CM

## 2020-01-04 NOTE — Therapy (Signed)
[facility] [address] [place], [name], [address] Phone: [phone]   Fax:  [phone]  Occupational Therapy Treatment  Patient Details  Name: Dylan [name] MRN: [id number] Date of Birth: 1946-06-02 Referring Provider (OT): Dr. Claudia Desanctis   Encounter Date: 01/04/2020   OT End of Session - 01/04/20 1643    Visit Number 9    Number of Visits 17    Date for OT Re-Evaluation 01/02/20    Authorization Type MCR    Authorization - Visit Number 9    Authorization - Number of Visits 10    Progress Note Due on Visit 10    OT Start Time 6237    OT Stop Time 1530    OT Time Calculation (min) 45 min    Activity Tolerance Patient tolerated treatment well    Behavior During Therapy [facility] for tasks assessed/performed           Past Medical History:  Diagnosis Date  . Anemia 10/18/2014   Baseline about 12 and stable from 2010 to 2016. Colon 2009 in [place] (records cannot be obtained).  EGD Dr Benson [place] 2011 nl   . Chronic diastolic heart failure ([place]) 10/18/2014   Noted ECHO 10/2014. Grade 2. EF 50-55%   . Chronic renal failure, stage 3 (moderate) 10/18/2014   Baseline Cr about 1.5. Stable from 2010 to 2016.   Marland Kitchen Coronary artery disease   . Diverticulosis 10/08/2014   Seen on CT. Reportedly on Colon in [place] in 2009. Freq bouts of diverticulitis.  . DM (diabetes mellitus), type 2 with renal complications ([place]) 12/07/8313  . Essential hypertension 10/09/2014   Poor control with 5 drug therapy.   . Former tobacco use 10/08/2014  . Gout   . OSA on CPAP 10/08/2014  . Pulmonary HTN ([place]) 10/18/2014   Noted as severe ECHO 2016. Likely 2/2 severe OSA.    Past Surgical History:  Procedure Laterality Date  . CHOLECYSTECTOMY    . COLONOSCOPY WITH PROPOFOL N/A 02/12/2019   Procedure: COLONOSCOPY WITH PROPOFOL;  Surgeon: Carol Ada, MD;  [facility];  Service: Endoscopy;  Laterality: N/A;  . PERCUTANEOUS PINNING Left 10/21/2019   Procedure: CLOSED  REDUCTION WITH PERCUTANEOUS PINNING AND SUTURE REPAIR OF LACERATION  OF SMALL FINGER,  SUTURE REPAIR OF LACERATION OF INDEX FINGER;  Surgeon: Cindra Presume, MD;  Location: [facility];  Service: Plastics;  Laterality: Left;    There were no vitals filed for this visit.   Subjective Assessment - 01/04/20 1640    Subjective  It doesn't really hurt unless you really bend it    Currently in Pain? No/denies    Pain Score 0-No pain              [facility] OT Assessment - 01/04/20 0001      Left Hand PROM   L Little  MCP 0-90 65 Degrees    L Little PIP 0-100 60 Degrees    L Little DIP 0-70 30 Degrees                    OT Treatments/Exercises (OP) - 01/04/20 0001      ADLs   Bathing Reports use of left hand to help with bathing/dressing.      Home Maintenance Reports difficulty opening door - difficulty turning doorknob.        Modalities   Modalities Fluidotherapy      Ultrasound   Ultrasound Location left hand - 5th digit  Ultrasound Parameters Pulsed 33mz, 0.8 w/cm2, x 8 min    Ultrasound Goals Edema   scar management     LUE Fluidotherapy   Number Minutes Fluidotherapy 10 Minutes    LUE Fluidotherapy Location Hand    Comments wrapped in composite flexion - pain free range      Splinting   Splinting Patient unable to fit in prefabricated extension assist splint due to edema between MCP/PIP 5th digit.  May benefit from a custom extension splint for night time.,                    OT Education - 01/04/20 1642    Education Details Reviewed use of buddy strap for AROM exercises    Person(s) Educated Patient    Methods Explanation    Comprehension Verbalized understanding;Need further instruction            OT Short Term Goals - 12/08/19 1733      OT SHORT TERM GOAL #1   Title Independent with splint wear and care and hygiene care including pin site care    Time 4    Period Weeks    Status Achieved      OT SHORT TERM GOAL #2   Title Independent  with initial A/ROM HEP (once cleared)    Time 4    Period Weeks    Status Achieved             OT Long Term Goals - 11/02/19 1414      OT LONG TERM GOAL #1   Title Pt independent with updated HEP    Time 8    Period Weeks    Status New      OT LONG TERM GOAL #2   Title Pt able to demo 90% of full composite flexion Lt small and ring fingers for grasping    Time 8    Period Weeks    Status New      OT LONG TERM GOAL #3   Title Pt to demo 50% of grip strength Lt hand compared to Rt    Time 8    Period Weeks    Status New      OT LONG TERM GOAL #4   Title Pt to use Lt hand as assist for light bilateral tasks/ADLS    Time 8    Period Weeks    Status New                 Plan - 01/04/20 1644    Clinical Impression Statement Patient continues with limitations in range of motion both passive and active, and patient is beginning to use left hand functionally    OT Frequency 2x / week    OT Duration 8 weeks    Plan goal check - PN!  continue aggressive ROM and light strengthening, may benefit from modalities ([place] edema and scar management, estim), also consider pm extension splint?, needs further reinforcement of putty HEP    OT Home Exercise Plan AROM, Blocking exercises    Consulted and Agree with Plan of Care Patient           Patient will benefit from skilled therapeutic intervention in order to improve the following deficits and impairments:           Visit Diagnosis: Stiffness of left hand, not elsewhere classified  Muscle weakness (generalized)  Localized edema  Pain in left hand  Other disturbances of skin sensation    Problem  List Patient Active Problem List   Diagnosis Date Noted  . Rectal bleeding 01/21/2019  . Coronary artery disease 04/04/2018  . Chronic pancreatitis ([place]) 11/10/2017  . Thrombocytopenia ([place]) 10/09/2016  . Allergic rhinitis 06/15/2015  . 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 Atherosclerosis of native arteries of  extremity with intermittent claudication ([place]) 12/28/2014  . Healthcare maintenance 12/12/2014  . Gout 10/19/2014  . 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 Anemia 10/18/2014  . Morbid obesity ([place]) 10/18/2014  . Pulmonary HTN ([place]) 10/18/2014  . Chronic diastolic heart failure ([place]) 10/18/2014  . Hypertension associated with diabetes ([place]) 10/09/2014  . OSA (obstructive sleep apnea) 10/08/2014  . Former tobacco use 10/08/2014  . Diverticulosis 10/08/2014  . DM (diabetes mellitus), type 2 with renal complications ([place]) 37/16/9678    Mariah Milling, OTR/L 01/04/2020, 4:45 PM  [facility] [address] [place], [name], [address] Phone: [phone]   Fax:  [phone]  Name: Dylan [name] MRN: [id number] Date of Birth: 1945/09/28

## 2020-01-12 ENCOUNTER — Other Ambulatory Visit: Payer: Self-pay

## 2020-01-12 ENCOUNTER — Encounter: Payer: Self-pay | Admitting: Occupational Therapy

## 2020-01-12 ENCOUNTER — Other Ambulatory Visit: Payer: Self-pay | Admitting: Internal Medicine

## 2020-01-12 ENCOUNTER — Ambulatory Visit: Payer: Medicare Other | Attending: Plastic Surgery | Admitting: Occupational Therapy

## 2020-01-12 DIAGNOSIS — M25642 Stiffness of left hand, not elsewhere classified: Secondary | ICD-10-CM | POA: Insufficient documentation

## 2020-01-12 DIAGNOSIS — R209 Unspecified disturbances of skin sensation: Secondary | ICD-10-CM | POA: Diagnosis not present

## 2020-01-12 DIAGNOSIS — M6281 Muscle weakness (generalized): Secondary | ICD-10-CM | POA: Insufficient documentation

## 2020-01-12 DIAGNOSIS — R208 Other disturbances of skin sensation: Secondary | ICD-10-CM

## 2020-01-12 DIAGNOSIS — M79642 Pain in left hand: Secondary | ICD-10-CM | POA: Diagnosis not present

## 2020-01-12 DIAGNOSIS — R600 Localized edema: Secondary | ICD-10-CM | POA: Insufficient documentation

## 2020-01-12 MED ORDER — VERAPAMIL HCL ER 360 MG PO CP24: 360.0000 mg | ORAL_CAPSULE | Freq: Every day | ORAL | 1 refills | Status: DC

## 2020-01-12 NOTE — Therapy (Signed)
[facility] [address] [place], [name], [address] Phone: [phone]   Fax:  [phone]  Occupational Therapy Treatment and Progress Note  Patient Details  Name: Dylan [name] MRN: [id number] Date of Birth: Dec 22, 1945 Referring Provider (OT): Dr. Claudia Desanctis  Covering dates of service from 11/02/19-01/12/20  Encounter Date: 01/12/2020   OT End of Session - 01/12/20 1702    Visit Number 10    Number of Visits 17    Authorization Type MCR    Authorization - Visit Number 10    Authorization - Number of Visits 10    Progress Note Due on Visit 20    OT Start Time 0923    OT Stop Time 1530    OT Time Calculation (min) 45 min    Activity Tolerance Patient tolerated treatment well    Behavior During Therapy [facility] for tasks assessed/performed           Past Medical History:  Diagnosis Date  . Anemia 10/18/2014   Baseline about 12 and stable from 2010 to 2016. Colon 2009 in [place] (records cannot be obtained).  EGD Dr Benson [place] 2011 nl   . Chronic diastolic heart failure ([place]) 10/18/2014   Noted ECHO 10/2014. Grade 2. EF 50-55%   . Chronic renal failure, stage 3 (moderate) 10/18/2014   Baseline Cr about 1.5. Stable from 2010 to 2016.   Marland Kitchen Coronary artery disease   . Diverticulosis 10/08/2014   Seen on CT. Reportedly on Colon in [place] in 2009. Freq bouts of diverticulitis.  . DM (diabetes mellitus), type 2 with renal complications ([place]) 3/0/0762  . Essential hypertension 10/09/2014   Poor control with 5 drug therapy.   . Former tobacco use 10/08/2014  . Gout   . OSA on CPAP 10/08/2014  . Pulmonary HTN ([place]) 10/18/2014   Noted as severe ECHO 2016. Likely 2/2 severe OSA.    Past Surgical History:  Procedure Laterality Date  . CHOLECYSTECTOMY    . COLONOSCOPY WITH PROPOFOL N/A 02/12/2019   Procedure: COLONOSCOPY WITH PROPOFOL;  Surgeon: Carol Ada, MD;  [facility];  Service: Endoscopy;  Laterality: N/A;  . PERCUTANEOUS PINNING Left  10/21/2019   Procedure: CLOSED REDUCTION WITH PERCUTANEOUS PINNING AND SUTURE REPAIR OF LACERATION  OF SMALL FINGER,  SUTURE REPAIR OF LACERATION OF INDEX FINGER;  Surgeon: Cindra Presume, MD;  Location: [facility];  Service: Plastics;  Laterality: Left;    There were no vitals filed for this visit.   Subjective Assessment - 01/12/20 1454    Subjective  I am using it a bit more - washing pots and dishes, washing myself in the shower.  The swelling doesn't want to go away.    Currently in Pain? No/denies    Pain Score 0-No pain                        OT Treatments/Exercises (OP) - 01/12/20 0001      ADLs   Leisure Patient able to cast and reel fishing rod, and hopes to fish with his buddy within the week.        Exercises   Exercises Theraputty;Hand      Hand Exercises   Other Hand Exercises Patient reports edema still present - encouraged exercise throughout the day - not just at night to help encourage mobility and decrease edema.        Theraputty   Theraputty - Grip Worked to strengthen PIP /DIP flexion in  composite fist.  Patient able to create indent with left pinky.  Encouraged patient to do putty exercises at home.        Ultrasound   Ultrasound Location L hand, 5th digit volar/dorsal    Ultrasound Parameters pulsed 56mz, 0.8w/cm2, x 5 min    Ultrasound Goals Edema   range of motion     Manual Therapy   Manual therapy comments Passive, prolonged stretch to left digits for composite flexion.  Patient able to make contact pad of 5th digit to palm for first time today - passive.  Unable to sustain actively yet.                    OT Education - 01/12/20 1702    Education Details reviewed timing of exercises, and reinforced benefit of putty exercise    Person(s) Educated Patient    Methods Explanation;Demonstration;Tactile cues;Verbal cues    Comprehension Need further instruction;Verbalized understanding;Returned demonstration            OT Short  Term Goals - 01/12/20 1705      OT SHORT TERM GOAL #1   Title Independent with splint wear and care and hygiene care including pin site care    Status Achieved      OT SHORT TERM GOAL #2   Title Independent with initial A/ROM HEP (once cleared)    Status Achieved             OT Long Term Goals - 01/12/20 1705      OT LONG TERM GOAL #1   Title Pt independent with updated HEP    Status On-going      OT LONG TERM GOAL #2   Title Pt able to demo 90% of full composite flexion Lt small and ring fingers for grasping    Status On-going      OT LONG TERM GOAL #3   Title Pt to demo 50% of grip strength Lt hand compared to Rt    Status On-going      OT LONG TERM GOAL #4   Title Pt to use Lt hand as assist for light bilateral tasks/ADLS    Status Achieved                 Plan - 01/12/20 1703    Clinical Impression Statement Patient continues with edema, especially dorsum over PIP left fifth digit.  Patient reports increased functional use of left hand.    OT Frequency 2x / week    OT Duration 8 weeks    OT Treatment/Interventions Self-care/ADL training;Therapeutic exercise;Ultrasound;Manual Therapy;Splinting;Therapeutic activities;Cryotherapy;Paraffin;DME and/or AE instruction;Compression bandaging;Fluidtherapy;Electrical Stimulation;Scar mobilization;Moist Heat;Passive range of motion;Patient/family education    Plan continue with passive to active range in LUE 5th digit.  Edema management, US/Stretch, functional strengthening    OT Home Exercise Plan AROM, Blocking exercises, putty    Consulted and Agree with Plan of Care Patient           Patient will benefit from skilled therapeutic intervention in order to improve the following deficits and impairments:           Visit Diagnosis: Stiffness of left hand, not elsewhere classified - Plan: Ot plan of care cert/re-cert  Muscle weakness (generalized) - Plan: Ot plan of care cert/re-cert  Localized edema - Plan: Ot  plan of care cert/re-cert  Pain in left hand - Plan: Ot plan of care cert/re-cert  Other disturbances of skin sensation - Plan: Ot plan of care cert/re-cert  Problem List Patient Active Problem List   Diagnosis Date Noted  . Rectal bleeding 01/21/2019  . Coronary artery disease 04/04/2018  . Chronic pancreatitis ([place]) 11/10/2017  . Thrombocytopenia ([place]) 10/09/2016  . Allergic rhinitis 06/15/2015  . 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 Atherosclerosis 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 ([place]) 12/28/2014  . Healthcare maintenance 12/12/2014  . Gout 10/19/2014  . 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 Anemia 10/18/2014  . Morbid obesity ([place]) 10/18/2014  . Pulmonary HTN ([place]) 10/18/2014  . Chronic diastolic heart failure ([place]) 10/18/2014  . Hypertension associated with diabetes ([place]) 10/09/2014  . OSA (obstructive sleep apnea) 10/08/2014  . Former tobacco use 10/08/2014  . Diverticulosis 10/08/2014  . DM (diabetes mellitus), type 2 with renal complications ([place]) 16/96/7893    Mariah Milling, OTR/L 01/12/2020, 5:09 PM  [facility] [address] [place], [name], [address] Phone: [phone]   Fax:  [phone]  Name: HATCHER FRONING MRN: [id number] Date of Birth: 01/16/1946

## 2020-01-12 NOTE — Telephone Encounter (Signed)
Refill Request   verapamil (VERELAN PM) 360 MG 24 hr capsule  [place], [place]

## 2020-01-18 ENCOUNTER — Ambulatory Visit: Payer: Medicare Other | Admitting: Occupational Therapy

## 2020-01-20 ENCOUNTER — Ambulatory Visit: Payer: Medicare Other | Admitting: Occupational Therapy

## 2020-01-20 ENCOUNTER — Encounter: Payer: Self-pay | Admitting: Occupational Therapy

## 2020-01-20 ENCOUNTER — Other Ambulatory Visit: Payer: Self-pay

## 2020-01-20 DIAGNOSIS — M79642 Pain in left hand: Secondary | ICD-10-CM

## 2020-01-20 DIAGNOSIS — R600 Localized edema: Secondary | ICD-10-CM

## 2020-01-20 DIAGNOSIS — M25642 Stiffness of left hand, not elsewhere classified: Secondary | ICD-10-CM

## 2020-01-20 DIAGNOSIS — R208 Other disturbances of skin sensation: Secondary | ICD-10-CM

## 2020-01-20 DIAGNOSIS — M6281 Muscle weakness (generalized): Secondary | ICD-10-CM | POA: Diagnosis not present

## 2020-01-20 DIAGNOSIS — R209 Unspecified disturbances of skin sensation: Secondary | ICD-10-CM | POA: Diagnosis not present

## 2020-01-20 NOTE — Therapy (Signed)
[facility] [address] [place], [name], [address] Phone: [phone]   Fax:  [phone]  Occupational Therapy Treatment  Patient Details  Name: Dylan [name] MRN: [id number] Date of Birth: Jul 17, 1945 Referring Provider (OT): Dr. Claudia Desanctis   Encounter Date: 01/20/2020   OT End of Session - 01/20/20 1552    Visit Number 11    Number of Visits 17    Authorization Type MCR    Authorization - Visit Number 11    Authorization - Number of Visits 20    Progress Note Due on Visit 20    OT Start Time 3614    OT Stop Time 1530    OT Time Calculation (min) 45 min    Equipment Utilized During Treatment Fluidotherapy, ultrasound    Activity Tolerance Patient tolerated treatment well    Behavior During Therapy [facility] for tasks assessed/performed           Past Medical History:  Diagnosis Date  . Anemia 10/18/2014   Baseline about 12 and stable from 2010 to 2016. Colon 2009 in [place] (records cannot be obtained).  EGD Dr Benson Norway 2011 nl   . Chronic diastolic heart failure ([place]) 10/18/2014   Noted ECHO 10/2014. Grade 2. EF 50-55%   . Chronic renal failure, stage 3 (moderate) 10/18/2014   Baseline Cr about 1.5. Stable from 2010 to 2016.   Marland Kitchen Coronary artery disease   . Diverticulosis 10/08/2014   Seen on CT. Reportedly on Colon in [place] in 2009. Freq bouts of diverticulitis.  . DM (diabetes mellitus), type 2 with renal complications ([place]) 10/09/1538  . Essential hypertension 10/09/2014   Poor control with 5 drug therapy.   . Former tobacco use 10/08/2014  . Gout   . OSA on CPAP 10/08/2014  . Pulmonary HTN ([place]) 10/18/2014   Noted as severe ECHO 2016. Likely 2/2 severe OSA.    Past Surgical History:  Procedure Laterality Date  . CHOLECYSTECTOMY    . COLONOSCOPY WITH PROPOFOL N/A 02/12/2019   Procedure: COLONOSCOPY WITH PROPOFOL;  Surgeon: Carol Ada, MD;  Location[facility];  Service: Endoscopy;  Laterality: N/A;  . PERCUTANEOUS PINNING Left  10/21/2019   Procedure: CLOSED REDUCTION WITH PERCUTANEOUS PINNING AND SUTURE REPAIR OF LACERATION  OF SMALL FINGER,  SUTURE REPAIR OF LACERATION OF INDEX FINGER;  Surgeon: Cindra Presume, MD;  Location: [facility];  Service: Plastics;  Laterality: Left;    There were no vitals filed for this visit.   Subjective Assessment - 01/20/20 1448    Subjective  I am holding both hands on the wheel now - used to just do my right hand    Currently in Pain? No/denies    Pain Score 0-No pain              [facility] OT Assessment - 01/20/20 0001      Left Hand PROM   L Little  MCP 0-90 75 Degrees    L Little PIP 0-100 80 Degrees    L Little DIP 0-70 45 Degrees                    OT Treatments/Exercises (OP) - 01/20/20 0001      Hand Exercises   Other Hand Exercises Patient reports minimal use of putty - "just don't care much for that"  Reinforced benefit of putty for strengthening and improve range of motion.  Reinforced blocking exercises and assisted composite fist stretch.  Ultrasound   Ultrasound Location L hand 5 th digit, volar surface/dorsal surface while stretching into flex/ext    Ultrasound Parameters pulsed 51mz, 0.8w/cm2, x8 min    Ultrasound Goals Edema   scar management     LUE Fluidotherapy   Number Minutes Fluidotherapy 10 Minutes    LUE Fluidotherapy Location Hand    Comments completed gentle exercise during as prep for stretch/PROM      Manual Therapy   Manual therapy comments Passive, prolonged stretch to left digits for composite flexion.  Wrapped into fisted position for 2 minutes,  Patient able to make contact pad of 5th digit to palm passively.  Unable to sustain actively yet.                      OT Short Term Goals - 01/12/20 1705      OT SHORT TERM GOAL #1   Title Independent with splint wear and care and hygiene care including pin site care    Status Achieved      OT SHORT TERM GOAL #2   Title Independent with initial A/ROM HEP (once  cleared)    Status Achieved             OT Long Term Goals - 01/20/20 1540      OT LONG TERM GOAL #2   Title Pt able to demo 90% of full composite flexion Lt small and ring fingers for grasping    Status Achieved                 Plan - 01/20/20 1553    Clinical Impression Statement Patient has shown improvement overall in range of motion and functional use of his non-dominant left hand, and still lacks end range flexion and extension in 5th digit.    OT Frequency 2x / week    OT Duration 8 weeks    OT Treatment/Interventions Self-care/ADL training;Therapeutic exercise;Ultrasound;Manual Therapy;Splinting;Therapeutic activities;Cryotherapy;Paraffin;DME and/or AE instruction;Compression bandaging;Fluidtherapy;Electrical Stimulation;Scar mobilization;Moist Heat;Passive range of motion;Patient/family education    Plan Functional use of left hand, grip strength, continue with modalities as warranted to progress as able - PIP/DIP flex/ext    OT Home Exercise Plan AROM, Blocking exercises, putty    Consulted and Agree with Plan of Care Patient           Patient will benefit from skilled therapeutic intervention in order to improve the following deficits and impairments:           Visit Diagnosis: Stiffness of left hand, not elsewhere classified  Muscle weakness (generalized)  Localized edema  Pain in left hand  Other disturbances of skin sensation    Problem List Patient Active Problem List   Diagnosis Date Noted  . Rectal bleeding 01/21/2019  . Coronary artery disease 04/04/2018  . Chronic pancreatitis ([facility] 11/10/2017  . Thrombocytopenia ([facility] 10/09/2016  . Allergic rhinitis 06/15/2015  . 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 Atherosclerosis 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 ([place] 12/28/2014  . Healthcare maintenance 12/12/2014  . Gout 10/19/2014  . 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 Anemia  10/18/2014  . Morbid obesity ([facility] 10/18/2014  . Pulmonary HTN ([facility] 10/18/2014  . Chronic diastolic heart failure ([facility] 10/18/2014  . Hypertension associated with diabetes ([facility] 10/09/2014  . OSA (obstructive sleep apnea) 10/08/2014  . Former tobacco use 10/08/2014  . Diverticulosis 10/08/2014  . DM (diabetes mellitus), type 2 with renal complications ([facility] 059/93/5701   [place],  Algernon Huxley, OTR/L 01/20/2020, 3:59 PM  [facility] [address] [place], [name], [address] Phone: [phone]   Fax:  [phone]  Name: Dylan [name] MRN: [id number] Date of Birth: 06-Jul-1946

## 2020-01-21 DIAGNOSIS — N1832 Chronic kidney disease, stage 3b: Secondary | ICD-10-CM | POA: Diagnosis not present

## 2020-01-25 ENCOUNTER — Ambulatory Visit: Payer: Medicare Other | Admitting: Occupational Therapy

## 2020-01-27 ENCOUNTER — Ambulatory Visit: Payer: Medicare Other | Admitting: Occupational Therapy

## 2020-02-01 ENCOUNTER — Ambulatory Visit: Payer: Medicare Other | Admitting: Occupational Therapy

## 2020-02-01 ENCOUNTER — Other Ambulatory Visit: Payer: Self-pay

## 2020-02-01 DIAGNOSIS — M25642 Stiffness of left hand, not elsewhere classified: Secondary | ICD-10-CM

## 2020-02-01 DIAGNOSIS — R209 Unspecified disturbances of skin sensation: Secondary | ICD-10-CM | POA: Diagnosis not present

## 2020-02-01 DIAGNOSIS — R600 Localized edema: Secondary | ICD-10-CM

## 2020-02-01 DIAGNOSIS — M6281 Muscle weakness (generalized): Secondary | ICD-10-CM | POA: Diagnosis not present

## 2020-02-01 DIAGNOSIS — M79642 Pain in left hand: Secondary | ICD-10-CM | POA: Diagnosis not present

## 2020-02-01 DIAGNOSIS — R208 Other disturbances of skin sensation: Secondary | ICD-10-CM

## 2020-02-01 NOTE — Therapy (Signed)
[facility] [address] [place], [name], [address] Phone: [phone]   Fax:  [phone]  Occupational Therapy Treatment  Patient Details  Name: Dylan [name] MRN: [id number] Date of Birth: 1945-11-03 Referring Provider (OT): Dr. Claudia Desanctis   Encounter Date: 02/01/2020   OT End of Session - 02/01/20 1409    Visit Number 12    Number of Visits 17    Authorization Type MCR    Authorization - Visit Number 12    Authorization - Number of Visits 20    Progress Note Due on Visit 20    OT Start Time 1404    OT Stop Time 1444    OT Time Calculation (min) 40 min    Equipment Utilized During Treatment Fluidotherapy, ultrasound    Activity Tolerance Patient tolerated treatment well    Behavior During Therapy Dylan [name] for tasks assessed/performed           Past Medical History:  Diagnosis Date  . Anemia 10/18/2014   Baseline about 12 and stable from 2010 to 2016. Colon 2009 in [place] (records cannot be obtained).  EGD Dr Benson Norway 2011 nl   . Chronic diastolic heart failure ([place]) 10/18/2014   Noted ECHO 10/2014. Grade 2. EF 50-55%   . Chronic renal failure, stage 3 (moderate) 10/18/2014   Baseline Cr about 1.5. Stable from 2010 to 2016.   Marland Kitchen Coronary artery disease   . Diverticulosis 10/08/2014   Seen on CT. Reportedly on Colon in [place] in 2009. Freq bouts of diverticulitis.  . DM (diabetes mellitus), type 2 with renal complications ([place]) 0/03/6282  . Essential hypertension 10/09/2014   Poor control with 5 drug therapy.   . Former tobacco use 10/08/2014  . Gout   . OSA on CPAP 10/08/2014  . Pulmonary HTN ([place]) 10/18/2014   Noted as severe ECHO 2016. Likely 2/2 severe OSA.    Past Surgical History:  Procedure Laterality Date  . CHOLECYSTECTOMY    . COLONOSCOPY WITH PROPOFOL N/A 02/12/2019   Procedure: COLONOSCOPY WITH PROPOFOL;  Surgeon: Carol Ada, MD;  [facility];  Service: Endoscopy;  Laterality: N/A;  . PERCUTANEOUS PINNING Left  10/21/2019   Procedure: CLOSED REDUCTION WITH PERCUTANEOUS PINNING AND SUTURE REPAIR OF LACERATION  OF SMALL FINGER,  SUTURE REPAIR OF LACERATION OF INDEX FINGER;  Surgeon: Cindra Presume, MD;  Location: [facility];  Service: Plastics;  Laterality: Left;    There were no vitals filed for this visit.   Subjective Assessment - 02/01/20 1405    Subjective  Denies pain    Currently in Pain? No/denies                     Hand Exercises  Other Hand Exercises Patient reports he hasn't exercised that much in the last 2 weeks, he's been moving. Therapist reminded pt to call if he is going to miss appts since he no-showed 2x last week.     Ultrasound  Ultrasound Location L hand 5 th digit, volar surface/dorsal surface  Ultrasound Parameters pulsed 65mz, 0.8w/cm2, x 5 min   Ultrasound Goals Edema   scar management    LUE Fluidotherapy  Number Minutes Fluidotherapy 10 Minutes   LUE Fluidotherapy Location Hand   Comments completed gentle exercise during as prep for stretch/PROM            A/ROM composite finger flexion, PIP and DIP flexion/ extension followed by composite finger flexion with place and hold. Gripper set at  level 2 to pick up 1 inch blocks for sustained grip, min difficulty Graded clothespins for sustained pinch with ring and small finger, min v.c Discussed plans to d/c next visit. Reminded pt to bring putty next visit.            OT Short Term Goals - 01/12/20 1705      OT SHORT TERM GOAL #1   Title Independent with splint wear and care and hygiene care including pin site care    Status Achieved      OT SHORT TERM GOAL #2   Title Independent with initial A/ROM HEP (once cleared)    Status Achieved             OT Long Term Goals - 02/01/20 1433      OT LONG TERM GOAL #1   Title Pt independent with updated HEP    Status On-going   need to review putty     OT LONG TERM GOAL #2   Title Pt able to demo 90% of full composite flexion Lt small and  ring fingers for grasping    Status Achieved      OT LONG TERM GOAL #3   Title Pt to demo 50% of grip strength Lt hand compared to Rt    Status Achieved   R 99.2, L 56.4     OT LONG TERM GOAL #4   Title Pt to use Lt hand as assist for light bilateral tasks/ADLS    Status Achieved                 Plan - 02/01/20 1406    Clinical Impression Statement Pt is progressing slowly towards goals. Anticipate d/c next visit.    Occupational performance deficits (Please refer to evaluation for details): ADL's;IADL's;Leisure    Body Structure / Function / Physical Skills ADL;ROM;IADL;Edema;Scar mobility;Sensation;Strength;Flexibility;Coordination;Pain;UE functional use    Rehab Potential Good    OT Frequency 2x / week    OT Duration 8 weeks    OT Treatment/Interventions Self-care/ADL training;Therapeutic exercise;Ultrasound;Manual Therapy;Splinting;Therapeutic activities;Cryotherapy;Paraffin;DME and/or AE instruction;Compression bandaging;Fluidtherapy;Electrical Stimulation;Scar mobilization;Moist Heat;Passive range of motion;Patient/family education    Plan continue aggressive ROM and light strengthening, may benefit from modalities ([place] for edema and scar management, estim),  needs further reinforcement of putty HEP    OT Home Exercise Plan AROM, Blocking exercises, putty    Consulted and Agree with Plan of Care Patient           Patient will benefit from skilled therapeutic intervention in order to improve the following deficits and impairments:   Body Structure / Function / Physical Skills: ADL, ROM, IADL, Edema, Scar mobility, Sensation, Strength, Flexibility, Coordination, Pain, UE functional use       Visit Diagnosis: Stiffness of left hand, not elsewhere classified  Muscle weakness (generalized)  Localized edema  Pain in left hand  Other disturbances of skin sensation    Problem List Patient Active Problem List   Diagnosis Date Noted  . Rectal bleeding 01/21/2019    . Coronary artery disease 04/04/2018  . Chronic pancreatitis ([place]) 11/10/2017  . Thrombocytopenia ([place]) 10/09/2016  . Allergic rhinitis 06/15/2015  . 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 Atherosclerosis 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 ([place]) 12/28/2014  . Healthcare maintenance 12/12/2014  . Gout 10/19/2014  . 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 Anemia 10/18/2014  . Morbid obesity ([place]) 10/18/2014  . Pulmonary HTN ([place]) 10/18/2014  . Chronic diastolic heart failure (  [place]) 10/18/2014  . Hypertension associated with diabetes ([place]) 10/09/2014  . OSA (obstructive sleep apnea) 10/08/2014  . Former tobacco use 10/08/2014  . Diverticulosis 10/08/2014  . DM (diabetes mellitus), type 2 with renal complications ([place]) 12/87/8676    Camie Hauss 02/01/2020, 2:37 PM  [facility] [address] [place], [name], [address] Phone: [phone]   Fax:  [phone]  Name: MARCHELLO ROTHGEB MRN: [id number] Date of Birth: 1946-02-22

## 2020-02-03 ENCOUNTER — Ambulatory Visit: Payer: Medicare Other | Admitting: Occupational Therapy

## 2020-02-03 ENCOUNTER — Other Ambulatory Visit: Payer: Self-pay

## 2020-02-03 DIAGNOSIS — M25642 Stiffness of left hand, not elsewhere classified: Secondary | ICD-10-CM

## 2020-02-03 DIAGNOSIS — M79642 Pain in left hand: Secondary | ICD-10-CM

## 2020-02-03 DIAGNOSIS — M6281 Muscle weakness (generalized): Secondary | ICD-10-CM

## 2020-02-03 DIAGNOSIS — R209 Unspecified disturbances of skin sensation: Secondary | ICD-10-CM | POA: Diagnosis not present

## 2020-02-03 DIAGNOSIS — R600 Localized edema: Secondary | ICD-10-CM

## 2020-02-03 DIAGNOSIS — R208 Other disturbances of skin sensation: Secondary | ICD-10-CM

## 2020-02-03 NOTE — Therapy (Signed)
Occupational Therapy Treatment  Patient Details  Name: Dylan [name] MRN: [id number] Date of Birth: 1945-12-15 Referring Provider (OT): Dr. Claudia Desanctis   Encounter Date: 02/03/2020   OT End of Session - 02/03/20 1436    Visit Number 13    Number of Visits 17    Authorization Type MCR    Authorization - Visit Number 13    Authorization - Number of Visits 20    Progress Note Due on Visit 20    OT Start Time 5020    OT Stop Time 1440   OT Time Calculation (min) 40 min    Equipment Utilized During Treatment Fluidotherapy, ultrasound    Activity Tolerance Patient tolerated treatment well    Behavior During Therapy WFL for tasks assessed/performed           Past Medical History:  Diagnosis Date  . Anemia 10/18/2014   Baseline about 12 and stable from 2010 to 2016. Colon 2009 in [place] (records cannot be obtained).  EGD Dr Benson Norway 2011 nl   . Chronic diastolic heart failure ([place]) 10/18/2014   Noted ECHO 10/2014. Grade 2. EF 50-55%   . Chronic renal failure, stage 3 (moderate) 10/18/2014   Baseline Cr about 1.5. Stable from 2010 to 2016.   Marland Kitchen Coronary artery disease   . Diverticulosis 10/08/2014   Seen on CT. Reportedly on Colon in [place] in 2009. Freq bouts of diverticulitis.  . DM (diabetes mellitus), type 2 with renal complications ([place]) 09/10/5731  . Essential hypertension 10/09/2014   Poor control with 5 drug therapy.   . Former tobacco use 10/08/2014  . Gout   . OSA on CPAP 10/08/2014  . Pulmonary HTN ([place]) 10/18/2014   Noted as severe ECHO 2016. Likely 2/2 severe OSA.    Past Surgical History:  Procedure Laterality Date  . CHOLECYSTECTOMY    . COLONOSCOPY WITH PROPOFOL N/A 02/12/2019   Procedure: COLONOSCOPY WITH PROPOFOL;  Surgeon: Carol Ada, MD;  [facility];  Service: Endoscopy;  Laterality: N/A;  . PERCUTANEOUS PINNING Left 10/21/2019   Procedure: CLOSED REDUCTION WITH PERCUTANEOUS PINNING AND SUTURE REPAIR OF LACERATION  OF SMALL FINGER,  SUTURE  REPAIR OF LACERATION OF INDEX FINGER;  Surgeon: Cindra Presume, MD;  Location: [facility];  Service: Plastics;  Laterality: Left;    There were no vitals filed for this visit.    Pt denies pain           Treatment: checked progress towards remaining goals and reviewed HEP. Fluidotherapy x 10 mins to LUE for stiffness, no adverse reactions.  [place] 3 mhz, 0.8 w/cm 2, 20% to 5th digit x 5 mins for scar mobilization, no adverse reactions. A/ROM 5 th digit PIP flexion 55, Dip flexion 45         OT Education - 02/03/20 1431    Education Details reveiwed blocking for PIP, DIP, composite place and hold ex, red putty, min v.c, progress towards goals    Person(s) Educated Patient    Methods Explanation;Demonstration;Verbal cues    Comprehension Verbalized understanding;Returned demonstration            OT Short Term Goals - 01/12/20 1705      OT SHORT TERM GOAL #1   Title Independent with splint wear and care and hygiene care including pin site care    Status Achieved      OT SHORT TERM GOAL #2  Title Independent with initial A/ROM HEP (once cleared)    Status Achieved             OT Long Term Goals - 02/03/20 1429      OT LONG TERM GOAL #1   Title Pt independent with updated HEP    Status Achieved   red putty     OT LONG TERM GOAL #2   Title Pt able to demo 90% of full composite flexion Lt small and ring fingers for grasping    Status Achieved      OT LONG TERM GOAL #3   Title Pt to demo 50% of grip strength Lt hand compared to Rt    Status Achieved   R 99.2, L 56.4     OT LONG TERM GOAL #4   Title Pt to use Lt hand as assist for light bilateral tasks/ADLS    Status Achieved                 Plan - 02/03/20 1437    Clinical Impression Statement Pt demonstrates good overall progress. He agreees with plans for d/c.    Occupational performance deficits (Please refer to evaluation for details): ADL's;IADL's;Leisure    Body Structure / Function / Physical  Skills ADL;ROM;IADL;Edema;Scar mobility;Sensation;Strength;Flexibility;Coordination;Pain;UE functional use    Rehab Potential Good    OT Frequency 2x / week    OT Duration 8 weeks    OT Treatment/Interventions Self-care/ADL training;Therapeutic exercise;Ultrasound;Manual Therapy;Splinting;Therapeutic activities;Cryotherapy;Paraffin;DME and/or AE instruction;Compression bandaging;Fluidtherapy;Electrical Stimulation;Scar mobilization;Moist Heat;Passive range of motion;Patient/family education    Plan d/c OT    OT Home Exercise Plan AROM, Blocking exercises, putty    Consulted and Agree with Plan of Care Patient           Patient will benefit from skilled therapeutic intervention in order to improve the following deficits and impairments:   Body Structure / Function / Physical Skills: ADL, ROM, IADL, Edema, Scar mobility, Sensation, Strength, Flexibility, Coordination, Pain, UE functional use       Visit Diagnosis: Stiffness of left hand, not elsewhere classified  Muscle weakness (generalized)  Localized edema  Pain in left hand  Other disturbances of skin sensation   OCCUPATIONAL THERAPY DISCHARGE SUMMARY   Current functional level related to goals / functional outcomes: Pt met goals.   Remaining deficits: Decreased ROM, decreased strength   Education / Equipment: Pt was educated regarding HEP for ROM and strength. Pt returned demonstration. Plan: Patient agrees to discharge.  Patient goals were met. Patient is being discharged due to meeting the stated rehab goals.  ?????      Problem List Patient Active Problem List   Diagnosis Date Noted  . Rectal bleeding 01/21/2019  . Coronary artery disease 04/04/2018  . Chronic pancreatitis ([place]) 11/10/2017  . Thrombocytopenia ([place]) 10/09/2016  . Allergic rhinitis 06/15/2015  . 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 Atherosclerosis 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 ([place]) 12/28/2014  . Healthcare  maintenance 12/12/2014  . Gout 10/19/2014  . 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 Anemia 10/18/2014  . Morbid obesity ([place]) 10/18/2014  . Pulmonary HTN ([place]) 10/18/2014  . Chronic diastolic heart failure ([place]) 10/18/2014  . Hypertension associated with diabetes ([place]) 10/09/2014  . OSA (obstructive sleep apnea) 10/08/2014  . Former tobacco use 10/08/2014  . Diverticulosis 10/08/2014  . DM (diabetes mellitus), type 2 with renal complications ([place]) 38/33/3832    Tonesha Tsou 02/03/2020, 2:39 PM Theone Murdoch, OTR/L [phone] Phone: [phone] 4:55 PM 02/03/20 [facility] [address] [place], [name], [address] Phone: [phone]   Fax:  [phone]  Name: Dylan [name] MRN: [id number] Date of Birth: Dec 19, 1945

## 2020-02-04 ENCOUNTER — Encounter: Payer: Self-pay | Admitting: *Deleted

## 2020-02-23 ENCOUNTER — Other Ambulatory Visit: Payer: Self-pay

## 2020-02-23 ENCOUNTER — Ambulatory Visit (INDEPENDENT_AMBULATORY_CARE_PROVIDER_SITE_OTHER): Payer: Medicare Other | Admitting: Plastic Surgery

## 2020-02-23 ENCOUNTER — Encounter: Payer: Self-pay | Admitting: Plastic Surgery

## 2020-02-23 VITALS — BP 146/63 | HR 65 | Temp 98.2°F | Ht 66.0 in | Wt 200.0 lb

## 2020-02-23 DIAGNOSIS — S62617B Displaced fracture of proximal phalanx of left little finger, initial encounter for open fracture: Secondary | ICD-10-CM | POA: Diagnosis not present

## 2020-02-23 NOTE — Progress Notes (Signed)
   Referring Provider Bartholomew Crews, MD No address on file   CC: No chief complaint on file.     Dylan [name] is an 74 y.o. male.  HPI: Patient presents 4 months out from closed reduction percutaneous pinning of the left small finger fracture. He feels like he is doing well with no complaints. He has regained the majority of his range of motion. He is not having any pain.  Review of Systems General: Denies fevers or chills  Physical Exam Vitals with BMI 02/23/2020 12/23/2019 11/25/2019  Height _0  _1  _2   Weight 200 lbs 200 lbs 200 lbs  BMI 32.3 71.5 95.3  Systolic 967 289 791  Diastolic 63 62 85  Pulse 65 54 65    General:  No acute distress,  Alert and oriented, Non-Toxic, Normal speech and affect Examination of the left hand shows good range of motion of all fingers. He cannot quite get his small finger down to his palm but he is pretty close. All the scarring and soft tissue of has healed well. He is nontender throughout the finger.  Assessment/Plan Patient had a good result after percutaneous fixation of a small finger fracture. He is completed therapy and is overall happy with his results. I will plan to see him again on an as-needed basis. All of his questions were answered.  Cindra Presume 02/23/2020, 10:13 AM

## 2020-02-24 ENCOUNTER — Encounter: Payer: 59 | Admitting: Internal Medicine

## 2020-03-08 NOTE — Progress Notes (Signed)
Established Patient Office Visit  Subjective:  Patient ID: Dylan [name], male    DOB: 1946/02/23  Age: 74 y.o. MRN: [id number]  CC: No chief complaint on file. "My finger is better"  HPI Dylan [name] presents for f/u of finger surgery and rehab, DM2 and HTN, and to review his PMH with new PCP (myself).    In general, he reports that he is doing well.  On specific questioning, he comments on feeling dizzy when moving about, which resolves when he is lying down.  Duration est. 1-2 wks.  This feels like a sensation of movement rather than of impending faint.  No falls or near falls.  The feeling extinguishes after he has moved about slowly for awhile.   He hasn't noticed if direction of gaze or head turn exacerbates.  No associated changes in hearing or vision, no headache, no nausea.    He comments on "cold in his eyes" which is manifested  as tearing and sometimes with awakening with small amt dried crust at lids.    Finger injury (degloving from a dog leash wrapped about finger), surgery discussed.  He continues to have stiffness and numbness at the distal digit but under the circumstances is pleased with his recovery. "I still have my finger! I don't walk the dog anymore though".   Doesn't wear CPAP (indication OSA), wears his 02 (indication pulmonary HTN) day/hs "when I feel like I need it".  It is not unusual for 02 sats to be < 80, which is usually when he puts it on.  Most recent gout flare was 8-9 mo ago.  Sees Dr. Justin Mend q [facility] for renal f/u.  No lower leg discomfort with exertion (hx of intermittent claudication).  No CP at rest or with exertion (hx of CAD and pulmonary HTN).    Lives alone where he is independent in all ADLs and IADLs, with exception of driving - his 32 yo granddtr drives him around.  ROS:  Negative unless otherwise stated in HPI.  Health maintenance: Flu vaccine - today Covid vacc - 10/2019, ? 2nd dose date TDaP 10/2019 Pneumonia vacc: 13 valent 04/2015; 23  valent 07/2016 CRC screening- colonoscopy 02/2019 no polyps, no further screening colonoscopies needed.    Past Medical History:  Diagnosis Date  . Anemia 10/18/2014   Baseline about 12 and stable from 2010 to 2016. Colon 2009 in [place] (records cannot be obtained).  EGD Dr Benson Norway 2011 nl   . Chronic diastolic heart failure ([place]) 10/18/2014   Noted ECHO 10/2014. Grade 2. EF 50-55%   . Chronic renal failure, stage 3 (moderate) 10/18/2014   Baseline Cr about 1.5. Stable from 2010 to 2016.   Marland Kitchen Coronary artery disease   . Diverticulosis 10/08/2014   Seen on CT. Reportedly on Colon in [place] in 2009. Freq bouts of diverticulitis.  . DM (diabetes mellitus), type 2 with renal complications ([place]) 11/05/8333  . Essential hypertension 10/09/2014   Poor control with 5 drug therapy.   . Former tobacco use 10/08/2014  . Gout   . OSA on CPAP 10/08/2014  . Pulmonary HTN ([place]) 10/18/2014   Noted as severe ECHO 2016. Likely 2/2 severe OSA.    Past Surgical History:  Procedure Laterality Date  . CHOLECYSTECTOMY    . COLONOSCOPY WITH PROPOFOL N/A 02/12/2019   Procedure: COLONOSCOPY WITH PROPOFOL;  Surgeon: Carol Ada, MD;  [facility];  Service: Endoscopy;  Laterality: N/A;  . PERCUTANEOUS PINNING Left 10/21/2019   Procedure: CLOSED  REDUCTION WITH PERCUTANEOUS PINNING AND SUTURE REPAIR OF LACERATION  OF SMALL FINGER,  SUTURE REPAIR OF LACERATION OF INDEX FINGER;  Surgeon: Cindra Presume, MD;  Location: [place];  Service: Plastics;  Laterality: Left;    Family History  Problem Relation Age of Onset  . Heart failure Sister        Died of complications Nov 2130  . CAD Sister        CABG x3 while in her 79's    Allergies  Allergen Reactions  . Ace Inhibitors Other (See Comments)    "ARF - see CRF overview"  . Spironolactone Other (See Comments)    Gynecomastia per pt report     Objective:    Physical Exam  147/49, 62, 96% RA, weight 193 LB (BMI 31.2) Comfortable and relaxed appearing, obese  habitus Eyes with tearing, no exudate or crust, no conjunctival erythema, mildly muddy appearance of sclera. No JVD, lungs clear.  Heart RRR, distant sounds, no murmur audible today No LE edema Feet warm and well perfused L 5th finger stiffness (no pain) with flexion; lacerations healed well.  No nystagmus on extreme directional gaze vertically or horizontally, and dizziness is not elicited by gaze maneuvers.  Dizziness not elicited by rotation or flexion or extension of neck either with or without gaze in same direction.    Assessment & Plan:   Dylan [name] is doing well.  Although he has significant multimorbidity with some serious health problems, he feels that he is functionally doing well and does not seem to be worried about anything in particular.  Details of assessment and plan are noted in problem based charting.  In general, it is observed that his obesity is improved over time, with gradual decline in BMI.  Diabetes 2 is tightly controlled placing him at risk for hypoglycemic events.  He has no retinopathy per most recent eye exam in April.  BMP today to evaluate renal function as we did not receive records from [facility].  It is noted that Dylan [name] is on an aggressive antihypertensive regimen for his resistant hypertension and remains above goal, though improvement may not be possible as long as he has untreated severe OSA which could be driving some of the elevated BPs.  He does not use his O2 frequently, and often not unless his saturation is less than 80%, which can further exacerbate his pulmonary hypertension.  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  His dizziness appears to be nonspecific and could be medication related.  He does not have classic vestibular impairment such as BPPV and his symptoms are not consistent with orthostatic hypotension.  Monitor for now.  Orders placed today  include BMP, A1c, and flu vaccine.  Follow-up:  Q3M    Angelica Pou, MD

## 2020-03-09 ENCOUNTER — Other Ambulatory Visit: Payer: Self-pay

## 2020-03-09 ENCOUNTER — Encounter: Payer: Self-pay | Admitting: Internal Medicine

## 2020-03-09 ENCOUNTER — Ambulatory Visit (INDEPENDENT_AMBULATORY_CARE_PROVIDER_SITE_OTHER): Payer: Medicare Other | Admitting: Internal Medicine

## 2020-03-09 VITALS — BP 147/49 | HR 62 | Temp 98.1°F | Ht 66.0 in | Wt 193.4 lb

## 2020-03-09 DIAGNOSIS — E66811 Obesity, class 1: Secondary | ICD-10-CM

## 2020-03-09 DIAGNOSIS — N183 Chronic kidney disease, stage 3 unspecified: Secondary | ICD-10-CM | POA: Insufficient documentation

## 2020-03-09 DIAGNOSIS — E1122 Type 2 diabetes mellitus with diabetic chronic kidney disease: Secondary | ICD-10-CM | POA: Diagnosis not present

## 2020-03-09 DIAGNOSIS — G4733 Obstructive sleep apnea (adult) (pediatric): Secondary | ICD-10-CM | POA: Diagnosis not present

## 2020-03-09 DIAGNOSIS — I70213 Atherosclerosis of native arteries of extremities with intermittent claudication, bilateral legs: Secondary | ICD-10-CM | POA: Diagnosis not present

## 2020-03-09 DIAGNOSIS — N1832 Chronic kidney disease, stage 3b: Secondary | ICD-10-CM | POA: Diagnosis not present

## 2020-03-09 DIAGNOSIS — I10 Essential (primary) hypertension: Secondary | ICD-10-CM

## 2020-03-09 DIAGNOSIS — E669 Obesity, unspecified: Secondary | ICD-10-CM | POA: Insufficient documentation

## 2020-03-09 DIAGNOSIS — Z23 Encounter for immunization: Secondary | ICD-10-CM

## 2020-03-09 DIAGNOSIS — E1121 Type 2 diabetes mellitus with diabetic nephropathy: Secondary | ICD-10-CM | POA: Diagnosis not present

## 2020-03-09 DIAGNOSIS — I2721 Secondary pulmonary arterial hypertension: Secondary | ICD-10-CM

## 2020-03-09 DIAGNOSIS — N1831 Chronic kidney disease, stage 3a: Secondary | ICD-10-CM | POA: Insufficient documentation

## 2020-03-09 DIAGNOSIS — I2722 Pulmonary hypertension due to left heart disease: Secondary | ICD-10-CM

## 2020-03-09 DIAGNOSIS — I2510 Atherosclerotic heart disease of native coronary artery without angina pectoris: Secondary | ICD-10-CM | POA: Diagnosis not present

## 2020-03-09 DIAGNOSIS — I422 Other hypertrophic cardiomyopathy: Secondary | ICD-10-CM | POA: Diagnosis not present

## 2020-03-09 DIAGNOSIS — S61209S Unspecified open wound of unspecified finger without damage to nail, sequela: Secondary | ICD-10-CM

## 2020-03-09 DIAGNOSIS — R42 Dizziness and giddiness: Secondary | ICD-10-CM | POA: Diagnosis not present

## 2020-03-09 DIAGNOSIS — I700 Atherosclerosis of aorta: Secondary | ICD-10-CM

## 2020-03-09 DIAGNOSIS — I5032 Chronic diastolic (congestive) heart failure: Secondary | ICD-10-CM | POA: Diagnosis not present

## 2020-03-09 DIAGNOSIS — I130 Hypertensive heart and chronic kidney disease with heart failure and stage 1 through stage 4 chronic kidney disease, or unspecified chronic kidney disease: Secondary | ICD-10-CM | POA: Diagnosis not present

## 2020-03-09 DIAGNOSIS — M109 Gout, unspecified: Secondary | ICD-10-CM

## 2020-03-09 DIAGNOSIS — Z86010 Personal history of colonic polyps: Secondary | ICD-10-CM

## 2020-03-09 DIAGNOSIS — M1A00X0 Idiopathic chronic gout, unspecified site, without tophus (tophi): Secondary | ICD-10-CM

## 2020-03-09 HISTORY — DX: Obesity, class 1: E66.811

## 2020-03-09 HISTORY — DX: Chronic kidney disease, stage 3b: N18.32

## 2020-03-09 HISTORY — DX: Obesity, unspecified: E66.9

## 2020-03-09 LAB — GLUCOSE, CAPILLARY: Glucose-Capillary: 133 mg/dL — ABNORMAL HIGH (ref 70–99)

## 2020-03-09 LAB — POCT GLYCOSYLATED HEMOGLOBIN (HGB A1C): Hemoglobin A1C: 5.6 % (ref 4.0–5.6)

## 2020-03-10 LAB — BMP8+ANION GAP
Anion Gap: 16 mmol/L (ref 10.0–18.0)
BUN/Creatinine Ratio: 15 (ref 10–24)
BUN: 26 mg/dL (ref 8–27)
CO2: 20 mmol/L (ref 20–29)
Calcium: 9.3 mg/dL (ref 8.6–10.2)
Chloride: 107 mmol/L — ABNORMAL HIGH (ref 96–106)
Creatinine, Ser: 1.78 mg/dL — ABNORMAL HIGH (ref 0.76–1.27)
GFR calc Af Amer: 42 mL/min/{1.73_m2} — ABNORMAL LOW (ref >59)
GFR calc non Af Amer: 37 mL/min/{1.73_m2} — ABNORMAL LOW (ref >59)
Glucose: 113 mg/dL — ABNORMAL HIGH (ref 65–99)
Potassium: 4.2 mmol/L (ref 3.5–5.2)
Sodium: 143 mmol/L (ref 134–144)

## 2020-03-28 ENCOUNTER — Encounter: Payer: Self-pay | Admitting: Internal Medicine

## 2020-03-28 DIAGNOSIS — S61209A Unspecified open wound of unspecified finger without damage to nail, initial encounter: Secondary | ICD-10-CM | POA: Insufficient documentation

## 2020-03-28 DIAGNOSIS — I700 Atherosclerosis of aorta: Secondary | ICD-10-CM

## 2020-03-28 HISTORY — DX: Unspecified open wound of unspecified finger without damage to nail, initial encounter: S61.209A

## 2020-03-28 HISTORY — DX: Atherosclerosis of aorta: I70.0

## 2020-03-28 NOTE — Assessment & Plan Note (Signed)
Chronic and stable.

## 2020-03-28 NOTE — Assessment & Plan Note (Signed)
No signs or symptoms of R sided failure at this time.  He has home 02 but tends to underutilize it (often doesn't apply unless 02 sats < 80).  Problem stable and chronic with no changes made.

## 2020-03-28 NOTE — Assessment & Plan Note (Signed)
Does not wear CPAP and isn't interested in mask refitting.  This problem is contributing to his resistant hypertension and to his severe pulmonary hypertension.

## 2020-03-28 NOTE — Assessment & Plan Note (Signed)
Problem is chronic and stable; no claudication or ischemic skin changes of feet at this time.  Most recent ABIs were 2019.  Tx consists of antiplatelet and control of DM2, HTN, and lipids.

## 2020-03-28 NOTE — Assessment & Plan Note (Signed)
A1Cs have been on the low side for an older person with multimorbidity. 5.6 today, would like to see it closer to 7 to reduce risk of hypoglycemia.  Eye exam 09/2019 no retinopathy.

## 2020-03-28 NOTE — Assessment & Plan Note (Signed)
Asymptomatic incidental finding on imaging 2019.  Treatment is antiplatelet and control of HTN, DM2 and lipids.

## 2020-03-28 NOTE — Assessment & Plan Note (Signed)
No signs or symptoms of CHF at this time.  Given severity of hypertrophy, infiltrative process such as amyloid disease is a consideration.

## 2020-03-28 NOTE — Assessment & Plan Note (Signed)
Pt reports that most recent gout flare was about 8 mo ago.  Avoid thiazides.

## 2020-03-28 NOTE — Assessment & Plan Note (Signed)
Intensive (essentially maximum) regimen though SBP not at goal, which may not be possible.  Untreated OSA as a secondary cause may be driving this, though he is not able to tolerate CPAP.  Continue current medications as guided by Dr. Justin Mend.

## 2020-04-06 ENCOUNTER — Telehealth: Payer: Self-pay

## 2020-04-06 NOTE — Telephone Encounter (Signed)
Patient is due to schedule shipment for his Trulicity thru [employer].  [facility] has change shipment procedures and meds being shipped to patient's home address must be refilled by patients to schedule shipment.  They will no longer schedule a shipment if requested by MD's office to go to patients home.  I spoke to Dylan [name] on the phone today and explained the situation to him.  He expressed an understanding that he will have to call to schedule shipment for his Trulicity refill.  The number given to patient is for [facility]: [phone].

## 2020-04-20 ENCOUNTER — Encounter (HOSPITAL_COMMUNITY): Payer: Self-pay | Admitting: Emergency Medicine

## 2020-04-20 ENCOUNTER — Emergency Department (HOSPITAL_COMMUNITY)
Admission: EM | Admit: 2020-04-20 | Discharge: 2020-04-20 | Disposition: A | Discharge status: Home / Self Care | Payer: Medicare Other | Attending: Emergency Medicine | Admitting: Emergency Medicine

## 2020-04-20 DIAGNOSIS — E1122 Type 2 diabetes mellitus with diabetic chronic kidney disease: Secondary | ICD-10-CM | POA: Insufficient documentation

## 2020-04-20 DIAGNOSIS — I5032 Chronic diastolic (congestive) heart failure: Secondary | ICD-10-CM | POA: Insufficient documentation

## 2020-04-20 DIAGNOSIS — K120 Recurrent oral aphthae: Secondary | ICD-10-CM | POA: Diagnosis not present

## 2020-04-20 DIAGNOSIS — I429 Cardiomyopathy, unspecified: Secondary | ICD-10-CM | POA: Insufficient documentation

## 2020-04-20 DIAGNOSIS — Z87891 Personal history of nicotine dependence: Secondary | ICD-10-CM | POA: Insufficient documentation

## 2020-04-20 DIAGNOSIS — Z79899 Other long term (current) drug therapy: Secondary | ICD-10-CM | POA: Diagnosis not present

## 2020-04-20 DIAGNOSIS — Z86010 Personal history of colonic polyps: Secondary | ICD-10-CM | POA: Insufficient documentation

## 2020-04-20 DIAGNOSIS — I130 Hypertensive heart and chronic kidney disease with heart failure and stage 1 through stage 4 chronic kidney disease, or unspecified chronic kidney disease: Secondary | ICD-10-CM | POA: Diagnosis not present

## 2020-04-20 DIAGNOSIS — Z7982 Long term (current) use of aspirin: Secondary | ICD-10-CM | POA: Insufficient documentation

## 2020-04-20 DIAGNOSIS — I2510 Atherosclerotic heart disease of native coronary artery without angina pectoris: Secondary | ICD-10-CM | POA: Diagnosis not present

## 2020-04-20 DIAGNOSIS — Z7984 Long term (current) use of oral hypoglycemic drugs: Secondary | ICD-10-CM | POA: Diagnosis not present

## 2020-04-20 DIAGNOSIS — N1832 Chronic kidney disease, stage 3b: Secondary | ICD-10-CM | POA: Insufficient documentation

## 2020-04-20 DIAGNOSIS — Z794 Long term (current) use of insulin: Secondary | ICD-10-CM | POA: Insufficient documentation

## 2020-04-20 MED ORDER — MAGIC MOUTHWASH W/LIDOCAINE: 5.0000 mL | Freq: Three times a day (TID) | ORAL | 0 refills | Status: DC | PRN

## 2020-04-20 NOTE — ED Triage Notes (Signed)
Pt arrives to ED with c/c of mouth ulcer.  Pt states he has had this ulcer for 1 month he thought he had bit his tongue but it seems to not be healing, currently causing no pain.

## 2020-04-20 NOTE — ED Notes (Signed)
Patient verbalizes understanding of discharge instructions. Opportunity for questioning and answers were provided. Arm band removed by staff, patient discharged from ED.

## 2020-04-20 NOTE — Discharge Instructions (Signed)
If the ulcer does not go away follow up with PCP as this may need to be biopsied.

## 2020-04-20 NOTE — ED Provider Notes (Signed)
[place] EMERGENCY DEPARTMENT Provider Note   CSN: [id number] Arrival date & time: 04/20/20  0831    History Chief Complaint  Patient presents with  . Mouth Lesions    Dylan [name] is a 74 y.o. male with past medical history significant for CHF chronic O2, DM, OSA, CAD who presents for evaluation of mouth ulcer.  Patient states first noticed it 1 month ago.  Had resolved occurred.  Located to right posterior tongue.  Tender palpation.  No overlying erythema, warmth or drainage.  Has not noticed any swelling time, neck.  No sensation of throat closing.  No facial swelling.  Denies fever, chills, nausea vomiting, chest pain, shortness of breath.  No rashes or lesions.  Denies additional aggravating or relieving factors.  He has not taken anything for this.  History obtained from patient and past medical records. No interpretor was used  HPI     Past Medical History:  Diagnosis Date  . Anemia 10/18/2014   Baseline about 12 and stable from 2010 to 2016. Colon 2009 in [place] (records cannot be obtained).  EGD Dr Benson Norway 2011 nl   . Aortic atherosclerosis ([place]) 03/28/2020   Incidental finding on imaging CT   . Atherosclerosis 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 ([place]) 12/28/2014   ABI Feb 2017 R 0.49; L 0.72 with diffuse dz ABI Aug 2017 R 0.49; L 0.69 ABI Jan 2018 R 0.61; L 0.73  ABI Feb 2019 R 0.55; L 0.71 Sees Dr Bridgett Larsson - recs ABI q 6 months  . Chronic diastolic 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 ([place]) 10/18/2014   Noted ECHO 10/2014. Grade 2. EF 50-55%; echo repeated 2019, severe hypertrophy with elevated filling pressures  . Chronic kidney disease, stage 3b ([place]) 03/09/2020   Dr. Justin Mend  Nephrologist, f/u Q 60M  . Coronary artery disease without angina pectoris 04/04/2018  . Diverticulosis 10/08/2014   Seen on CT. Reportedly on Colon in [place] in 2009. Freq bouts of diverticulitis.  . DM (diabetes mellitus), type 2 with renal complications  ([place]) 08/09/9796  . Former tobacco use 10/08/2014  . Gout   . 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 Stable from 2010 to 2016.  Negative SPEP and UPEP 2014 after ARF 2/2 continued ACE (lisinopril 20) use while vol contracted. Dr Justin Mend  . Obesity (BMI 30.0-34.9) 03/09/2020  . 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 OSA (obstructive sleep apnea) 10/08/2014   July 2016 : Severe OSA/hypopnea syndrome, AHI 128.4, O2 nadir of 84% RA. Failed CPAP on study. BiPA inspiratory pressure of 21 and expiratory pressure of 17 CWP. Consider ENT evaluation for potentially correctable upper airway obstruction contributing to the need for unusually high pressures - ENT July 2015 did not feel any intervention surgically was indicated. He wore a large F&P Simplus fullfac  . Personal history of colonic polyps 03/28/2014   Dr. Benson Norway, 2 polyps removed 2015.  No polyps on f/u in 2020.  No further surveillance suggested per Dr. Benson Norway.  Marland Kitchen Resistant hypertension 10/09/2014   Poor control with 6 drug therapy. 2016 : Aldo 37 but ARR 23.5   . Severe pulmonary arterial systolic hypertension ([place]) 10/18/2014   Noted as severe ECHO 2016 and 2019. Likely 2/2 severe untreated OSA. Pt is not adherent to CPAP.  Marland Kitchen Thrombocytopenia ([place]) 10/09/2016   Nl liver and spleen on [place] 2019    Patient Active Problem List   Diagnosis Date Noted  .  Degloving injury of finger 03/28/2020  . Aortic atherosclerosis ([place]) 03/28/2020  . Obesity (BMI 30.0-34.9) 03/09/2020  . Chronic kidney disease, stage 3b ([place]) 03/09/2020  . Dizziness, nonspecific 03/09/2020  . Coronary artery disease without angina pectoris 04/04/2018  . 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 Atherosclerosis 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 ([place]) 12/28/2014  . Gout 10/19/2014  . 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 Anemia 10/18/2014  . Severe pulmonary arterial systolic hypertension  ([place]) 10/18/2014  . Chronic diastolic 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 ([place]) 10/18/2014  . Resistant hypertension 10/09/2014  . OSA (obstructive sleep apnea) 10/08/2014  . Former tobacco use 10/08/2014  . Diverticulosis 10/08/2014  . Personal history of colonic polyps 03/28/2014  . DM (diabetes mellitus), type 2 with renal complications ([place]) 69/79/4801    Past Surgical History:  Procedure Laterality Date  . CHOLECYSTECTOMY    . COLONOSCOPY WITH PROPOFOL N/A 02/12/2019   Procedure: COLONOSCOPY WITH PROPOFOL;  Surgeon: Carol Ada, MD;  [facility];  Service: Endoscopy;  Laterality: N/A;  . PERCUTANEOUS PINNING Left 10/21/2019   Procedure: CLOSED REDUCTION WITH PERCUTANEOUS PINNING AND SUTURE REPAIR OF LACERATION  OF SMALL FINGER,  SUTURE REPAIR OF LACERATION OF INDEX FINGER;  Surgeon: Cindra Presume, MD;  Location: [facility];  Service: Plastics;  Laterality: Left;       Family History  Problem Relation Age of Onset  . Heart failure Sister        Died of complications Nov 6553  . CAD Sister        CABG x3 while in her 84's    Social History   Tobacco Use  . Smoking status: Former Smoker    Quit date: 10/02/1972    Years since quitting: 47.5  . Smokeless tobacco: Never Used  Vaping Use  . Vaping Use: Never used  Substance Use Topics  . Alcohol use: No    Alcohol/week: 0.0 standard drinks  . Drug use: No    Home Medications Prior to Admission medications   Medication Sig Start Date End Date Taking? Authorizing Provider  allopurinol (ZYLOPRIM) 100 MG tablet Take 1 tablet (100 mg total) by mouth daily. 08/05/19   Bartholomew Crews, MD  aspirin (BAYER LOW DOSE) 81 MG EC tablet Take 81 mg by mouth daily. Swallow whole.    [provider]  atorvastatin (LIPITOR) 40 MG tablet Take 1 tablet (40 mg total) by mouth daily. 05/06/19   Bartholomew Crews, MD  colchicine 0.6 MG tablet Take one pill by mouth daily for an acute gout flare until  pain is gone. Patient taking differently: Take 0.6 mg by mouth See admin instructions. Take 0.6 mg by mouth once a day as directed for an acute gout flare until pain is gone 08/27/18   Bartholomew Crews, MD  Dulaglutide (TRULICITY) 1.5 ZS/8.2LM SOPN Inject 0.5 mLs (1.5 mg total) into the skin once a week. 12/20/19   Lucious Groves, DO  eplerenone (INSPRA) 50 MG tablet Take 1 tablet (50 mg total) by mouth 2 (two) times daily. 11/29/19   Bartholomew Crews, MD  FREESTYLE LITE test strip Use to check blood sugar 4 times daily, at meals 3 times daily and at bedtime 06/02/19   Bartholomew Crews, MD  hydrALAZINE (APRESOLINE) 50 MG tablet Take 2 tablets (100 mg total) by mouth 3 (three) times daily. 08/05/19   Bartholomew Crews, MD  Insulin Pen Needle (PEN NEEDLES) 31G  X 5 MM MISC Inject 1 Dose into the skin daily. With Victoza DIAG CODE E11.29. INSULIN DEPENDENT 07/12/19   Bartholomew Crews, MD  ketotifen (ZADITOR) 0.025 % ophthalmic solution Place 1 drop into both eyes daily as needed (allergies).     [provider]  labetalol (NORMODYNE) 200 MG tablet Take 2 tablets (400 mg total) by mouth 2 (two) times daily. 11/29/19   Bartholomew Crews, MD  magic mouthwash w/lidocaine SOLN Take 5 mLs by mouth 3 (three) times daily as needed for mouth pain. 04/20/20   Aniayah Alaniz A, PA-C  minoxidil (LONITEN) 10 MG tablet Take 2 tablets (20 mg total) by mouth daily. 11/29/19   Bartholomew Crews, MD  polyethylene glycol [facility] / Floria Raveling) packet Take 17 g by mouth daily as needed (constipation).     [provider]  torsemide (DEMADEX) 10 MG tablet Take 1 tablet in morning and 1 tablet in the evening. If your weight is up by 3lbs in 1 day, take another dose Patient taking differently: Take 10 mg by mouth See admin instructions. Take 1 tablet in morning and 1 tablet in the evening. If your weight is up by 3lbs in 1 day, take another dose 05/19/19   Bartholomew Crews, MD  verapamil  (VERELAN PM) 360 MG 24 hr capsule Take 1 capsule (360 mg total) by mouth daily. 01/12/20   Sid Falcon, MD    Allergies    Ace inhibitors and Spironolactone  Review of Systems   Review of Systems  Constitutional: Negative.   HENT: Positive for mouth sores. Negative for congestion, drooling, ear discharge, ear pain, facial swelling, nosebleeds, postnasal drip, rhinorrhea, sinus pressure, sinus pain, sneezing, sore throat, trouble swallowing and voice change.   Respiratory: Negative.   Cardiovascular: Negative.   Gastrointestinal: Negative.   Genitourinary: Negative.   Musculoskeletal: Negative.   Skin: Negative.   Neurological: Negative.   All other systems reviewed and are negative.  Physical Exam Updated Vital Signs BP (!) 165/80 (BP Location: Right Arm)   Pulse 69   Temp 98.5 F (36.9 C) (Oral)   Resp 18   SpO2 99%   Physical Exam Vitals and nursing note reviewed.  Constitutional:      General: He is not in acute distress.    Appearance: He is well-developed. He is ill-appearing (Chronically ill appearing). He is not toxic-appearing or diaphoretic.     Comments: [place] at baseline, pleasant  HENT:     Head: Normocephalic and atraumatic.     Jaw: There is normal jaw occlusion.     Nose: Nose normal.     Mouth/Throat:     Mouth: Mucous membranes are moist. Oral lesions present.     Dentition: Abnormal dentition.     Pharynx: Oropharynx is clear. Uvula midline.      Comments: 3-4 mm circular ulcer to right posterior ulcer with erythematous base. Tenderness to palpation. No tongue swelling. No posterior oropharyngeal erythema, swelling. Eyes:     Pupils: Pupils are equal, round, and reactive to light.  Neck:     Trachea: Trachea and phonation normal.     Comments: No neck stiffness or neck rigidity Cardiovascular:     Rate and Rhythm: Normal rate and regular rhythm.     Pulses:          Radial pulses are 1+ on the right side and 1+ on the left side.     Heart sounds:  Normal heart sounds.  Pulmonary:  Effort: Pulmonary effort is normal. No respiratory distress.     Breath sounds: Normal breath sounds and air entry.  Abdominal:     General: Bowel sounds are normal. There is no distension.     Palpations: Abdomen is soft.     Tenderness: There is no abdominal tenderness. There is no right CVA tenderness, left CVA tenderness, guarding or rebound.     Hernia: No hernia is present.  Musculoskeletal:        General: Normal range of motion.     Cervical back: Full passive range of motion without pain, normal range of motion and neck supple.     Comments: Moves all extremities without difficulty. Compartments soft  Skin:    General: Skin is warm and dry.  Neurological:     Mental Status: He is alert.     Cranial Nerves: Cranial nerves are intact.     Sensory: Sensation is intact.     Motor: Motor function is intact.     Gait: Gait is intact.     Comments: Cn 2-12 grossly intact Ambulatory without difficulty    ED Results / Procedures / Treatments   Labs (all labs ordered are listed, but only abnormal results are displayed) Labs Reviewed - No data to display  EKG None  Radiology No results found.  Procedures Procedures (including critical care time)  Medications Ordered in ED Medications - No data to display  ED Course  I have reviewed the triage vital signs and the nursing notes.  Pertinent labs & imaging results that were available during my care of the patient were reviewed by me and considered in my medical decision making (see chart for details).  74 year old presents for evaluation of oral ulcer. Afebrile, non septic, chronically ill-appearing.  Presents for evaluation of right tongue ulcer.  Initially occurred 1 month ago who resolved and then reoccurred.  Mildly tender to palpation.  Does not appear infected.  3-4 mm circular ulcer which appears to be an aphthous ulcer to right posterior tongue.  No active bleeding or drainage.   Does not appear actively infected.  No vesicular lesions to suggest herpetic lesion.  No tongue edema, no additional rashes or lesions to patient or intraoral area.  Heart and lungs clear.  Abdomen soft, nontender.  Patient with stable vital signs without tachycardia, tachypnea, hypoxia.  Will treat for aphthous ulcer.  Discussed with patient if this does not resolve with mouthwash he is to follow-up with PCP given length of time may possibly need to be biopsied.  No evidence of oral thrush.  Patient without facial swelling, lip or tongue swelling I low suspicion for angioedema or bacterial infectious process.  He has no neck stiffness or neck rigidity.  No drooling, dysphagia or trismus.  No evidence of PTA, RPA.  Normal phonation.  No sensation of throat closing.  Tolerating p.o. intake here in ED.  The patient has been appropriately medically screened and/or stabilized in the ED. I have low suspicion for any other emergent medical condition which would require further screening, evaluation or treatment in the ED or require inpatient management.  Patient is hemodynamically stable and in no acute distress.  Patient able to ambulate in department prior to ED.  Evaluation does not show acute pathology that would require ongoing or additional emergent interventions while in the emergency department or further inpatient treatment.  I have discussed the diagnosis with the patient and answered all questions.  Pain is been managed while in  the emergency department and patient has no further complaints prior to discharge.  Patient is comfortable with plan discussed in room and is stable for discharge at this time.  I have discussed strict return precautions for returning to the emergency department.  Patient was encouraged to follow-up with PCP/specialist refer to at discharge.    MDM Rules/Calculators/A&P                           Final Clinical Impression(s) / ED Diagnoses Final diagnoses:  Aphthous ulcer of  mouth    Rx / DC Orders ED Discharge Orders         Ordered    magic mouthwash w/lidocaine SOLN  3 times daily PRN        04/20/20 1015           Antinette Keough A, PA-C 04/20/20 1034    Fredia Sorrow, MD 04/21/20 1042

## 2020-04-21 ENCOUNTER — Telehealth: Payer: Self-pay | Admitting: *Deleted

## 2020-04-21 NOTE — Telephone Encounter (Signed)
TOC CM received message from [facility] pharmacist with return call needed for magic mouthwash and lidocaine strength. ED provider updated to clarify. Confirmation received from Dr Rogene Houston. Call to [facility], spoke to pharmacist standard Hydrocortisone, Benadryl, Nystatin with .25% Lidocaine. [place], [place] ED TOC CM [phone]

## 2020-04-27 ENCOUNTER — Encounter: Payer: Self-pay | Admitting: Internal Medicine

## 2020-04-27 ENCOUNTER — Ambulatory Visit (INDEPENDENT_AMBULATORY_CARE_PROVIDER_SITE_OTHER): Payer: Medicare Other | Admitting: Internal Medicine

## 2020-04-27 DIAGNOSIS — I509 Heart failure, unspecified: Secondary | ICD-10-CM | POA: Diagnosis not present

## 2020-04-27 DIAGNOSIS — I70213 Atherosclerosis of native arteries of extremities with intermittent claudication, bilateral legs: Secondary | ICD-10-CM

## 2020-04-27 DIAGNOSIS — G4733 Obstructive sleep apnea (adult) (pediatric): Secondary | ICD-10-CM | POA: Diagnosis not present

## 2020-04-27 DIAGNOSIS — R42 Dizziness and giddiness: Secondary | ICD-10-CM

## 2020-04-27 DIAGNOSIS — I700 Atherosclerosis of aorta: Secondary | ICD-10-CM

## 2020-04-27 DIAGNOSIS — J3489 Other specified disorders of nose and nasal sinuses: Secondary | ICD-10-CM | POA: Diagnosis not present

## 2020-04-27 DIAGNOSIS — E1122 Type 2 diabetes mellitus with diabetic chronic kidney disease: Secondary | ICD-10-CM | POA: Diagnosis not present

## 2020-04-27 DIAGNOSIS — I2510 Atherosclerotic heart disease of native coronary artery without angina pectoris: Secondary | ICD-10-CM

## 2020-04-27 DIAGNOSIS — E1121 Type 2 diabetes mellitus with diabetic nephropathy: Secondary | ICD-10-CM

## 2020-04-27 DIAGNOSIS — I10 Essential (primary) hypertension: Secondary | ICD-10-CM

## 2020-04-27 DIAGNOSIS — I130 Hypertensive heart and chronic kidney disease with heart failure and stage 1 through stage 4 chronic kidney disease, or unspecified chronic kidney disease: Secondary | ICD-10-CM | POA: Diagnosis not present

## 2020-04-27 DIAGNOSIS — I739 Peripheral vascular disease, unspecified: Secondary | ICD-10-CM

## 2020-04-27 DIAGNOSIS — N189 Chronic kidney disease, unspecified: Secondary | ICD-10-CM | POA: Diagnosis not present

## 2020-04-27 DIAGNOSIS — K120 Recurrent oral aphthae: Secondary | ICD-10-CM | POA: Insufficient documentation

## 2020-04-27 HISTORY — DX: Recurrent oral aphthae: K12.0

## 2020-04-27 HISTORY — DX: Other specified disorders of nose and nasal sinuses: J34.89

## 2020-04-27 NOTE — Assessment & Plan Note (Signed)
feet without pulses, though are warm and in good condition.  Continue risk factor modification.  Asymptomatic at this time.

## 2020-04-27 NOTE — Assessment & Plan Note (Signed)
Feet are pulseless though in good repair.  We will add PAD to diagnoses.  Monitor.

## 2020-04-27 NOTE — Assessment & Plan Note (Signed)
Patient has had severe chronic nasal obstructive symptoms, and today on exam he does have nearly complete obliteration.  Has seen an ENT in the past who recommended surgery, but he was hesitant based on his comorbidities and potential risks.  He would not do well with a CPAP mask as consequence.

## 2020-04-27 NOTE — Assessment & Plan Note (Signed)
No angina.  Exercise tolerance has decreased due to multifactorial reasons.  Continue ASA (he had questions from the news media based on his understanding that aspirin could cause heart problems.  We discussed this and he understands that he should continue this important medication to prevent heart attack, stroke, and arterial disease.  He understands.

## 2020-04-27 NOTE — Assessment & Plan Note (Signed)
SBP 160s today.  Medication reviewed, compliant.  Did not take verapamil today as this prescription had expired though he will be picking up new supply today.  No changes.

## 2020-04-27 NOTE — Assessment & Plan Note (Signed)
Has not seen [place] [facility] since her last visit here.  No new information, most recent BMP was here at last visit no need for labs today.

## 2020-04-27 NOTE — Assessment & Plan Note (Signed)
Continuous glucose monitoring shows that he has been in target range between 92 and 175, averaging 141.  A1c in December.

## 2020-04-27 NOTE — Patient Instructions (Signed)
Dylan [name], it was great to catch up with you again today.  I'm glad your mouth sore has healed.  We discussed your recent swelling - your lungs are clear, and I suspect that the salt in your food may be causing you to retain extra fluid.  If the swelling gets worse, please come back so that we can adjust your torsemide.  We talked about the difficulty you are having with breathing through your nose.  Your nasal passages are nearly completely closed.  We talked about how the nose specialist had recommended surgery, and that you had hesitation about this, which is very reasonable.  We're going to think about this more, and talke seriously about risks and benefits.  Your diabetes looks amazing today and we don't have to make any changes in your medications!  Let's have you go back to the eye doctor and get you in to a dentist to help keep you healthy and functioning well.  I look forward to seeing you again in December when we'll check your A1C and continue our conversations.  Take care and stay well!  [name]

## 2020-04-27 NOTE — Assessment & Plan Note (Signed)
Very few instances since last visit.  He is not concerned, feels that the problem has improved.  Monitor.

## 2020-04-28 ENCOUNTER — Encounter: Payer: Self-pay | Admitting: Internal Medicine

## 2020-04-28 NOTE — Progress Notes (Signed)
Established Patient Office Visit  Subjective:  Patient ID: Dylan [name], male    DOB: 1945/12/13  Age: 74 y.o. MRN: [id number]  CC:  Chief Complaint  Patient presents with  . Follow-up    HPI Dylan [name] presents for f/u of chronic conditions, particularly DM and HTN control.  He reports doing well in interim, though presented to ED recently with a mouth sore dxed as aphthous ulcer which completedly resolved with magic mouthwash.  No other complaints in open ended questioning, but as questioning progressed, he became more open.  Increased LE edema over past 2-3 days and increased exertional dypsnea (fatiguing easily when mopping floor), though still able to lay flat (on side) at night w/o difficulty.  He recalls eating a salty burger and questions whether this is the cause.  No missed doses of diuretics, though he ran out of his CCB yesterday and thus his BP today reflects this missing medication.  No chest tightness or pressure, no cough.  He rarely weighs himself, though does have a scale at home. Has been wearing his 02 more frequently "when I feel like I need it", though without objective sat measurements.  Has difficult breathing through his nose, a chronic problem, and experiences year round clear nasal dripping which often worsens during meals.  No sinus pressure or ear symptoms.  He has tried nasal sprays without improvement, and was informed by an ENT that surgery would probably fix the problem, though the possibility of bleeding complications caused him to dismiss this option. He often breaths through his mouth (likely why he didn't do well with CPAP therapy).    He then discusses "cold in his eyes", manifested as crusted eyelids upon awakening.  He has dx of dry eye syndrome and has prescription eye drops which he feels have been ineffective.  His eye doctor explained that "the problem isn't your eyes, it's in your nose", which we discussed might be a reference to poor tear duct  drainage into the nasal cavity?  He notes that he has been able to do all ADLs and IADLs (drove himself here today) but is feeling more insecure about showering (balance, dyspnea) and inquires about possibility of a HHA.  His son is currently living with him "but he doesn't do anything really, he's never around".  Dylan [name] has had no falls.    He has not had a nephrology visit in the interim.   Past Medical History:  Diagnosis Date  . Anemia 10/18/2014   Baseline about 12 and stable from 2010 to 2016. Colon 2009 in [place] (records cannot be obtained).  EGD Dr Dylan [name] 2011 nl   . Aortic atherosclerosis ([place]) 03/28/2020   Incidental finding on imaging CT   . Atherosclerosis 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 ([place]) 12/28/2014   ABI Feb 2017 R 0.49; L 0.72 with diffuse dz ABI Aug 2017 R 0.49; L 0.69 ABI Jan 2018 R 0.61; L 0.73  ABI Feb 2019 R 0.55; L 0.71 Sees Dr Dylan [name] - recs ABI q 6 months  . 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 Grade 2. EF 50-55%; echo repeated 2019, severe hypertrophy with elevated filling pressures  . Chronic kidney disease, stage 3b ([place]) 03/09/2020   Dr. Justin [name]  Nephrologist, f/u Q 61M  . Coronary artery disease without angina pectoris 04/04/2018  . Diverticulosis 10/08/2014   Seen on CT. Reportedly on Colon in [place] in 2009. Freq bouts  of diverticulitis.  . DM (diabetes mellitus), type 2 with renal complications ([place]) 07/08/9145  . Former tobacco use 10/08/2014  . Gout   . 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 Stable from 2010 to 2016.  Negative SPEP and UPEP 2014 after ARF 2/2 continued ACE (lisinopril 20) use while vol contracted. Dr Dylan [name]  . Obesity (BMI 30.0-34.9) 03/09/2020  . 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 OSA (obstructive sleep apnea) 10/08/2014   July 2016 : Severe OSA/hypopnea syndrome, AHI 128.4, O2 nadir of 84% RA. Failed CPAP on  study. BiPA inspiratory pressure of 21 and expiratory pressure of 17 CWP. Consider ENT evaluation for potentially correctable upper airway obstruction contributing to the need for unusually high pressures - ENT July 2015 did not feel any intervention surgically was indicated. He wore a large F&P Simplus fullfac  . Personal history of colonic polyps 03/28/2014   Dr. Benson [name], 2 polyps removed 2015.  No polyps on f/u in 2020.  No further surveillance suggested per Dr. Benson [name].  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  He is unable to breathe through his nose comfortably.  This is part of the reason why he does not wear his oxygen regularly.  Nasal passages are nearly completely obstructed erythematous smooth glistening surface.  He has been told by his eye doctor that part of the reason his e  . Resistant hypertension 10/09/2014   Poor control with 6 drug therapy. 2016 : Aldo 37 but ARR 23.5   . Severe pulmonary arterial systolic hypertension ([place]) 10/18/2014   Noted as severe ECHO 2016 and 2019. Likely 2/2 severe untreated OSA. Pt is not adherent to CPAP.  Marland Kitchen Thrombocytopenia ([place]) 10/09/2016   Nl liver and spleen on [place] 2019  . Ulcer aphthous oral 04/27/2020   Evaluated emergency room last week, saw with Magic mouthwash    Past Surgical History:  Procedure Laterality Date  . CHOLECYSTECTOMY    . COLONOSCOPY WITH PROPOFOL N/A 02/12/2019   Procedure: COLONOSCOPY WITH PROPOFOL;  Surgeon: Dylan Ada, MD;  Location: [facility];  Service: Endoscopy;  Laterality: N/A;  . PERCUTANEOUS PINNING Left 10/21/2019   Procedure: CLOSED REDUCTION WITH PERCUTANEOUS PINNING AND SUTURE REPAIR OF LACERATION  OF SMALL FINGER,  SUTURE REPAIR OF LACERATION OF INDEX FINGER;  Surgeon: Dylan Presume, MD;  Location: [facility];  Service: Plastics;  Laterality: Left;    Family History  Problem Relation  Age of Onset  . Heart failure Sister        Died of complications Nov 8295  . CAD Sister        CABG x3 while in her 66's    Social History   Socioeconomic History  . Marital status: Divorced    Spouse name: Not on file  . Number of children: Not on file  . Years of education: Not on file  . Highest education level: Not on file  Occupational History  . Not on file  Tobacco Use  . Smoking status: Former Smoker    Quit date: 10/02/1972    Years since quitting: 47.6  . Smokeless tobacco: Never Used  Vaping Use  . Vaping Use: Never used  Substance and Sexual Activity  . Alcohol use: No    Alcohol/week: 0.0 standard drinks  . Drug use: No  . Sexual activity: Never  Other Topics Concern  . 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 Had yearly occupational testing inc PFT's. Now retired. Divorced. Has male sig other. Likes to go fishing.      He starting drinking as a teen and would drink to passing out. Couldn't keep job, have family. He quit drinking and smoking cold [place] with help of his faith. No ETOH since about age 57ish      Total of 7 brothers and 6 sisters.   Social Determinants of Health   Financial Resource Strain:   . Difficulty of Paying Living Expenses: Not on file  Food Insecurity:   . Worried About Charity fundraiser in the Last Year: Not on file  . Ran Out of Food in the Last Year: Not on file  Transportation Needs:   . Lack of Transportation (Medical): Not on file  . Lack of Transportation (Non-Medical): Not on file  Physical Activity:   . Days of Exercise per Week: Not on file  . Minutes of Exercise per Session: Not on file  Stress:   . Feeling of Stress : Not on file  Social Connections:   . Frequency of Communication with Friends and Family: Not on file  . Frequency of Social Gatherings with Friends and Family: Not on file  . Attends Religious Services: Not on file  . Active Member of Clubs or Organizations: Not  on file  . Attends Archivist Meetings: Not on file  . Marital Status: Not on file  Intimate Partner Violence:   . Fear of Current or Ex-Partner: Not on file  . Emotionally Abused: Not on file  . Physically Abused: Not on file  . Sexually Abused: Not on file    Outpatient Medications Prior to Visit  Medication Sig Dispense Refill  . allopurinol (ZYLOPRIM) 100 MG tablet Take 1 tablet (100 mg total) by mouth daily. 90 tablet 3  . aspirin (BAYER LOW DOSE) 81 MG EC tablet Take 81 mg by mouth daily. Swallow whole.    Marland Kitchen atorvastatin (LIPITOR) 40 MG tablet Take 1 tablet (40 mg total) by mouth daily. 90 tablet 3  . colchicine 0.6 MG tablet Take one pill by mouth daily for an acute gout flare until pain is gone. (Patient taking differently: Take 0.6 mg by mouth See admin instructions. Take 0.6 mg by mouth once a day as directed for an acute gout flare until pain is gone) 90 tablet 0  . Dulaglutide (TRULICITY) 1.5 BJ/4.7WG SOPN Inject 0.5 mLs (1.5 mg total) into the skin once a week. 15 pen 1  . eplerenone (INSPRA) 50 MG tablet Take 1 tablet (50 mg total) by mouth 2 (two) times daily. 180 tablet 3  . FREESTYLE LITE test strip Use to check blood sugar 4 times daily, at meals 3 times daily and at bedtime 360 each 1  . hydrALAZINE (APRESOLINE) 50 MG tablet Take 2 tablets (100 mg total) by mouth 3 (three) times daily. 540 tablet 3  . Insulin Pen Needle (PEN NEEDLES) 31G X 5 MM MISC Inject 1 Dose into the skin daily. With Victoza DIAG CODE E11.29. INSULIN DEPENDENT 100 each 1  . ketotifen (ZADITOR) 0.025 % ophthalmic solution Place 1 drop into both eyes daily as needed (allergies).     . labetalol (NORMODYNE) 200 MG tablet Take 2 tablets (400 mg total) by mouth 2 (two) times daily. 360 tablet 3  . minoxidil (LONITEN) 10 MG tablet Take 2  tablets (20 mg total) by mouth daily. 180 tablet 3  . polyethylene glycol (MIRALAX / GLYCOLAX) packet Take 17 g by mouth daily as needed (constipation).     .  torsemide (DEMADEX) 10 MG tablet Take 1 tablet in morning and 1 tablet in the evening. If your weight is up by 3lbs in 1 day, take another dose (Patient taking differently: Take 10 mg by mouth See admin instructions. Take 1 tablet in morning and 1 tablet in the evening. If your weight is up by 3lbs in 1 day, take another dose) 210 tablet 2  . verapamil (VERELAN PM) 360 MG 24 hr capsule Take 1 capsule (360 mg total) by mouth daily. 90 capsule 1  . magic mouthwash w/lidocaine SOLN Take 5 mLs by mouth 3 (three) times daily as needed for mouth pain. 60 mL 0   No facility-administered medications prior to visit.    Allergies  Allergen Reactions  . Ace Inhibitors Other (See Comments)    "ARF - see CRF overview"  . Spironolactone Other (See Comments)    Gynecomastia per pt report    ROS Negative unless otherwise specified in HPI    Objective:    Physical Exam  BP (!) 162/58 (BP Location: Right Arm, Patient Position: Sitting, Cuff Size: Small)   Pulse 64   Temp 98.3 F (36.8 C) (Oral)   Wt 199 lb (90.3 kg)   SpO2 98%   BMI 32.12 kg/m  Wt Readings from Last 3 Encounters:  04/27/20 199 lb (90.3 kg)  03/09/20 193 lb 6.4 oz (87.7 kg)  02/23/20 200 lb (90.7 kg)   Awake, alert, pleasant in interaction, conversant with normal orientation and recollection of details. Eye conjunctivae have mild edema and there is greater than normal tear production.  No palpebral conjunctival inflammation.  No exudate.  Sclerae are muddy appearing.  He has a chronic proptosis appearance most apparent on L.  Nasal turbinates are hypertrophies and mucosa is red and glistening, nasal passages nearly obliterated (the L has a minimal opening).  He demonstrates difficulty moving air through nostrils.  Mouth with poor dentition, no aphthous ulcers.  No pharyngeal cobblestoning and no post nasal drainage.  The sinuses are not pressured when head is bent forward in dependent fashion.  In sitting position, no JVD.   Effortless breathing at rest.  LUngs are clear throughout.  Heart RRR, heart sounds loudest over LUSB, split S2 is present.  Legs with about 1-2+ pretibial edema, does not extend to ankles or to feet.  Feet are in good repair, nails are filed (he does himself; doesn't clip, so as to avoid injury), pulseless, sensate to light touch.  He pushes off from chair to rise from seated position and somewhat laboriously lifts his large 02 tank strap over his shoulder, walks slowly down the hall.  He would be wheeled in transport chair from clinic to main entrance.    Health Maintenance Due  Topic Date Due  . COVID-19 Vaccine (1) Never done  . LIPID PANEL  01/12/2016    Lab Results  Component Value Date   TSH 0.800 03/06/2017   Lab Results  Component Value Date   WBC 9.2 10/21/2019   HGB 12.6 (L) 10/21/2019   HCT 40.2 10/21/2019   MCV 100.2 (H) 10/21/2019   PLT 152 10/21/2019   Lab Results  Component Value Date   NA 143 03/09/2020   K 4.2 03/09/2020   CO2 20 03/09/2020   GLUCOSE 113 (H) 03/09/2020  BUN 26 03/09/2020   CREATININE 1.78 (H) 03/09/2020   BILITOT 0.7 01/20/2019   ALKPHOS 51 05/17/2019   AST 11 (A) 05/17/2019   ALT 9 (A) 05/17/2019   PROT 7.6 01/20/2019   ALBUMIN 4.0 01/20/2019   CALCIUM 9.3 03/09/2020   ANIONGAP 10 10/21/2019   Lab Results  Component Value Date   CHOL 142 01/12/2015   Lab Results  Component Value Date   HDL 31 (L) 01/12/2015    Lab Results  Component Value Date   HGBA1C 5.6 03/09/2020      Assessment & Plan:    See problem based charting below.  No changes in treatment today, no labs needed.  I'll investigate HHA options.  Problem List Items Addressed This Visit      Cardiovascular and Mediastinum   Resistant hypertension (Chronic)    SBP 160s today.  Medication reviewed, compliant.  Did not take verapamil today as this prescription had expired though he will be picking up new supply today.  No changes.      Hypertensive heart and  kidney disease with HF and CKD ([place]) (Chronic)    Has not seen [place] [facility] consultant since her last visit here.  No new information, most recent BMP was here at last visit no need for labs today.      Atherosclerosis 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 ([name]) (Chronic)     feet without pulses, though are warm and in good condition.  Continue risk factor modification.  Asymptomatic at this time.      Coronary artery disease without angina pectoris (Chronic)    No angina.  Exercise tolerance has decreased due to multifactorial reasons.  Continue ASA (he had questions from the news media based on his understanding that aspirin could cause heart problems.  We discussed this and he understands that he should continue this important medication to prevent heart attack, stroke, and arterial disease.  He understands.      Aortic atherosclerosis ([name]) (Chronic)    Feet are pulseless though in good repair.  We will add PAD to diagnoses.  Monitor.        Respiratory   OSA (obstructive sleep apnea) (Chronic)    Patient has had severe chronic nasal obstructive symptoms, and today on exam he does have nearly complete obliteration.  Has seen an ENT in the past who recommended surgery, but he was hesitant based on his comorbidities and potential risks.  He would not do well with a CPAP mask as consequence.      Refractory obstruction of nasal airway (Chronic)     Digestive   Ulcer aphthous oral     Endocrine   DM (diabetes mellitus), type 2 with renal complications ([name]) (Chronic)    Continuous glucose monitoring shows that he has been in target range between 92 and 175, averaging 141.  A1c in December.        Other   Dizziness, nonspecific    Very few instances since last visit.  He is not concerned, feels that the problem has improved.  Monitor.         Follow-up: Return in about 2 months (around 06/27/2020) for routine f/u, A1C, discuss ENT f/u.    Angelica Pou, MD

## 2020-05-11 ENCOUNTER — Other Ambulatory Visit: Payer: Self-pay

## 2020-05-11 DIAGNOSIS — I5032 Chronic diastolic (congestive) heart failure: Secondary | ICD-10-CM

## 2020-05-11 MED ORDER — TORSEMIDE 10 MG PO TABS: ORAL_TABLET | ORAL | 2 refills | Status: DC

## 2020-05-11 NOTE — Telephone Encounter (Signed)
torsemide (DEMADEX) 10 MG tablet, refill request @  [facility], [place] Phone:  [phone]  Fax:  [phone]

## 2020-05-16 DIAGNOSIS — N183 Chronic kidney disease, stage 3 unspecified: Secondary | ICD-10-CM | POA: Diagnosis not present

## 2020-05-16 DIAGNOSIS — E1122 Type 2 diabetes mellitus with diabetic chronic kidney disease: Secondary | ICD-10-CM | POA: Diagnosis not present

## 2020-05-16 DIAGNOSIS — N2581 Secondary hyperparathyroidism of renal origin: Secondary | ICD-10-CM | POA: Diagnosis not present

## 2020-05-16 DIAGNOSIS — I129 Hypertensive chronic kidney disease with stage 1 through stage 4 chronic kidney disease, or unspecified chronic kidney disease: Secondary | ICD-10-CM | POA: Diagnosis not present

## 2020-05-17 ENCOUNTER — Other Ambulatory Visit: Payer: Self-pay

## 2020-05-17 DIAGNOSIS — E1122 Type 2 diabetes mellitus with diabetic chronic kidney disease: Secondary | ICD-10-CM

## 2020-05-17 DIAGNOSIS — N183 Chronic kidney disease, stage 3 unspecified: Secondary | ICD-10-CM

## 2020-05-17 DIAGNOSIS — Z794 Long term (current) use of insulin: Secondary | ICD-10-CM

## 2020-05-17 MED ORDER — FREESTYLE LITE TEST VI STRP: ORAL_STRIP | 1 refills | Status: DC

## 2020-05-17 NOTE — Telephone Encounter (Signed)
Need refill on FREESTYLE LITE test strip ;pt contact [facility], [address]

## 2020-05-19 ENCOUNTER — Other Ambulatory Visit: Payer: Self-pay | Admitting: *Deleted

## 2020-05-19 DIAGNOSIS — N183 Chronic kidney disease, stage 3 unspecified: Secondary | ICD-10-CM

## 2020-05-19 DIAGNOSIS — E1122 Type 2 diabetes mellitus with diabetic chronic kidney disease: Secondary | ICD-10-CM

## 2020-05-19 DIAGNOSIS — Z794 Long term (current) use of insulin: Secondary | ICD-10-CM

## 2020-05-19 MED ORDER — FREESTYLE LITE TEST VI STRP: ORAL_STRIP | 1 refills | Status: DC

## 2020-06-15 ENCOUNTER — Encounter: Payer: Self-pay | Admitting: Internal Medicine

## 2020-06-15 NOTE — Progress Notes (Signed)
[facility] appt 05/16/20 with Dr. Justin Mend: BP 140/66, weight 199, labs from 03/27/2020 reviewed and no changes planned.  Note made that he had not taken blood pressure medicines that morning.  Follow-up 6 months.

## 2020-06-16 ENCOUNTER — Encounter: Payer: Self-pay | Admitting: *Deleted

## 2020-06-16 NOTE — Progress Notes (Unsigned)

## 2020-06-20 ENCOUNTER — Encounter: Payer: Self-pay | Admitting: Internal Medicine

## 2020-06-20 NOTE — Progress Notes (Signed)
.  awv

## 2020-06-21 DIAGNOSIS — Z23 Encounter for immunization: Secondary | ICD-10-CM | POA: Diagnosis not present

## 2020-06-28 ENCOUNTER — Telehealth: Payer: Self-pay | Admitting: *Deleted

## 2020-06-28 NOTE — Telephone Encounter (Signed)
Received on (06/26/20) via of fax Immunization services provided to the patient from [facility].    Faxed Immunization information to patient's primary care provider (Dr. Elaina Pattee [facility]).  Confirmation received.//AB/CMA

## 2020-06-29 ENCOUNTER — Ambulatory Visit (INDEPENDENT_AMBULATORY_CARE_PROVIDER_SITE_OTHER): Payer: Medicare Other | Admitting: Internal Medicine

## 2020-06-29 ENCOUNTER — Encounter: Payer: Self-pay | Admitting: Internal Medicine

## 2020-06-29 VITALS — BP 121/52 | HR 56 | Temp 98.2°F | Wt 192.6 lb

## 2020-06-29 DIAGNOSIS — M1000 Idiopathic gout, unspecified site: Secondary | ICD-10-CM

## 2020-06-29 DIAGNOSIS — E1121 Type 2 diabetes mellitus with diabetic nephropathy: Secondary | ICD-10-CM

## 2020-06-29 DIAGNOSIS — E1122 Type 2 diabetes mellitus with diabetic chronic kidney disease: Secondary | ICD-10-CM

## 2020-06-29 DIAGNOSIS — N3941 Urge incontinence: Secondary | ICD-10-CM | POA: Diagnosis not present

## 2020-06-29 DIAGNOSIS — Z7984 Long term (current) use of oral hypoglycemic drugs: Secondary | ICD-10-CM

## 2020-06-29 DIAGNOSIS — N1832 Chronic kidney disease, stage 3b: Secondary | ICD-10-CM | POA: Diagnosis not present

## 2020-06-29 DIAGNOSIS — I129 Hypertensive chronic kidney disease with stage 1 through stage 4 chronic kidney disease, or unspecified chronic kidney disease: Secondary | ICD-10-CM

## 2020-06-29 DIAGNOSIS — I2721 Secondary pulmonary arterial hypertension: Secondary | ICD-10-CM | POA: Diagnosis not present

## 2020-06-29 DIAGNOSIS — I10 Essential (primary) hypertension: Secondary | ICD-10-CM

## 2020-06-29 LAB — POCT GLYCOSYLATED HEMOGLOBIN (HGB A1C): Hemoglobin A1C: 5.7 % — AB (ref 4.0–5.6)

## 2020-06-29 LAB — GLUCOSE, CAPILLARY: Glucose-Capillary: 132 mg/dL — ABNORMAL HIGH (ref 70–99)

## 2020-06-29 MED ORDER — VERAPAMIL HCL ER 360 MG PO CP24: 360.0000 mg | ORAL_CAPSULE | Freq: Every day | ORAL | 3 refills | Status: DC

## 2020-06-29 NOTE — Progress Notes (Signed)
Established Patient Office Visit  Subjective:  Patient ID: Dylan [name], male    DOB: 1945/07/18  Age: 74 y.o. MRN: [id number]  CC: No chief complaint on file.   HPI Dylan [name] presents for routine follow-up of chronic conditions today which will include type 2 diabetes mellitus, hypertension, lower extremity edema, and functional status.  In the interim, he had a routine visit with [facility] 05/16/20 with Dr. Justin Mend: BP 140/66, weight 199, labs from 03/27/2020 reviewed and no changes planned.  Note made that he had not taken blood pressure medicines that morning.  Follow-up 6 months.  Difficulty with urinary incontinence.  Noticing that he has significant urge - has sensation of needing to void, but bladder begins emptying.  No problem initiating stream.  Voiding frequency has increased, feels that he need to void almost constantly, often the volume isn't large.  No dysuria.  4-5x nocturia, getting minimal sleep.  FEels incomplete emptying, but if he waits awhile he can empty further.  Now wearing pullups.  Has had episodes of no control of spontaneous voiding whatsoever.    Swelling in legs/ankles for a couple weeks, taking an extra pill of torsemide each day for several days, swelling has gone down and he has resumed his usual dose.  Granddaughter just bought herself a new car so Dylan [name] will not be driving her around anymore.  He will miss this!  DM control has been doing well with no hypoglycemia and no significant elevations.  On Trulicity, today's D4Y 5.7.  Eyes with ongoing crusting upon awakening and he feels vision is declining.  He would like to see his ophthalmologist before his annual visit is due.  Still with problems with nose breathing.  Has decided against ENT referral for now, but open to future discussion. He was informed by ENT consultant that any surgery would be complicated and would involve significant bleeding.  Dylan [name] brought all of his medications which  were reviewed today. He has not bee taking allopurinol for some time.    Patient Active Problem List   Diagnosis Date Noted  . Refractory obstruction of nasal airway 04/27/2020  . Ulcer aphthous oral 04/27/2020  . Degloving injury of finger 03/28/2020  . Aortic atherosclerosis ([place]) 03/28/2020  . Obesity (BMI 30.0-34.9) 03/09/2020  . Chronic kidney disease, stage 3b ([place]) 03/09/2020  . Dizziness, nonspecific 03/09/2020  . Coronary artery disease without angina pectoris 04/04/2018  . 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 Atherosclerosis 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 ([place]) 12/28/2014  . Gout 10/19/2014  . 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 Anemia 10/18/2014  . Severe pulmonary arterial systolic hypertension ([place]) 10/18/2014  . 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 Resistant hypertension 10/09/2014  . OSA (obstructive sleep apnea) 10/08/2014  . Former tobacco use 10/08/2014  . Diverticulosis 10/08/2014  . Personal history of colonic polyps 03/28/2014  . DM (diabetes mellitus), type 2 with renal complications ([place]) 81/44/8185     Objective:    Physical Exam  There were no vitals taken for this visit. Wt Readings from Last 3 Encounters:  04/27/20 199 lb (90.3 kg)  03/09/20 193 lb 6.4 oz (87.7 kg)  02/23/20 200 lb (90.7 kg)   Weight today is 192, down 7 pounds from 2 months ago.  Dylan [name] is looking well today.  He has the chronic appearance of bilateral proptosis and sclera are muddy; no tearing or  lid crusting is visible.  Lungs are clear throughout, heart RRR with a gallop likely to represent a split heart sound.  No LE edema and skin turgor is generally reduced.  Feet in good condition (no discoloration, warm, no lesions)  though pulseless at baseline.    Assessment & Plan:  Dylan [name] is doing well.  Diabetes and blood pressure are well controlled.  BMP  today to determine whether he may have sustained acute kidney injury due to increasing the torsemide temporarily.  He does appear to be generally hypovolemic, but does not feel dizzy or thirsty.  Follow-up:     Dylan Pou, MD

## 2020-06-29 NOTE — Patient Instructions (Signed)
It was wonderful to see you today, Dylan [name]!  Today we talked about your bladder problems.  Your bladder is overactive and because you also have an enlarged prostate, I would advise that you see a urologist.  Your diabetes and blood pressure control are perfect!  No changes to your medicines today.  We will check your kidney function today.  We also discussed returning to your eye doctor because you've noticed your vision changing.    I'm glad you're doing well and hope you have a lovely holiday!  Merry Christmas and I will see you in the new year!  Let's check in again in 3 months.

## 2020-06-29 NOTE — Assessment & Plan Note (Signed)
Excellent control today at 121/52.  No change in medications.  BMP today for renal function.

## 2020-06-29 NOTE — Assessment & Plan Note (Signed)
No clinical signs or symptoms of right heart failure at this time.  Continue to monitor.

## 2020-06-29 NOTE — Assessment & Plan Note (Signed)
CKD noted to be stable at last nephrology visit 03/2020.  BMP today to follow-up given recent requirement for extra dose of torsemide to address lower extremity edema.

## 2020-06-29 NOTE — Assessment & Plan Note (Signed)
A1c 5.7 today.  Glucose readings downloaded, no episodes of hypoglycemia on Trulicity 1.5 weekly.  No change for now though I continue to be worried about potential hypoglycemia.

## 2020-06-29 NOTE — Assessment & Plan Note (Signed)
Patient has self discontinued allopurinol-he is not sure how this happened, and it may have just dropped off of his radar.  He has not had a gout flare in nearly a year, and recent increased use of diuretic did not trigger an attack.  I think is okay to hold off on the allopurinol for now.

## 2020-06-30 LAB — BMP8+ANION GAP
Anion Gap: 15 mmol/L (ref 10.0–18.0)
BUN/Creatinine Ratio: 21 (ref 10–24)
BUN: 43 mg/dL — ABNORMAL HIGH (ref 8–27)
CO2: 23 mmol/L (ref 20–29)
Calcium: 9.2 mg/dL (ref 8.6–10.2)
Chloride: 103 mmol/L (ref 96–106)
Creatinine, Ser: 2.04 mg/dL — ABNORMAL HIGH (ref 0.76–1.27)
GFR calc Af Amer: 36 mL/min/{1.73_m2} — ABNORMAL LOW (ref >59)
GFR calc non Af Amer: 31 mL/min/{1.73_m2} — ABNORMAL LOW (ref >59)
Glucose: 138 mg/dL — ABNORMAL HIGH (ref 65–99)
Potassium: 4.1 mmol/L (ref 3.5–5.2)
Sodium: 141 mmol/L (ref 134–144)

## 2020-08-07 ENCOUNTER — Telehealth: Payer: Self-pay | Admitting: *Deleted

## 2020-08-07 NOTE — Telephone Encounter (Signed)
Call from patient-states took last dose of trulicity today and will not be able to afford next refill.   Per pharmacy-pt's insurance covered trulicity, but copay is still $491.50 for 3 boxes.  Pharmacist states it's "most likely" due to pt not meeting his first of year deductible.  Will send info to pharmacy staff to assist pt with obtaining medication through alternative measures. Please advise.Regenia Skeeter, Kimberlynn Lumbra Cassady1/31/202211:46 AM   Can we dispense a sample for next dose?

## 2020-08-09 NOTE — Telephone Encounter (Signed)
Hi [employer],  Just following up to see if this is someone we would be able to complete PAP for. It appears he has medicare. In the interim I can look at samples as I believe we have Trulicity, but was hoping to do something for him long term.  Thanks! Cathalina Barcia

## 2020-08-09 NOTE — Telephone Encounter (Signed)
Yes, he just needs to re-enroll for 2022.  I think Rosendo Gros and I were discussing this yesterday so he may be on her list of people to call.  He would just have to fill out/sign a [employer].  They don't need proof of income as long as the income amount listed on his application is close to what will come up on a soft credit check.  This will also mail to his home address

## 2020-08-10 ENCOUNTER — Telehealth: Payer: Self-pay

## 2020-08-10 NOTE — Telephone Encounter (Signed)
Spoke to Dylan [name] and his son. He had rec'd a [employer] re-enrollment application in the mail last month but says it is expired. He also used his last pen earlier this week. He is coming into [facility] today to pickup some sample pens and fill out a new application.

## 2020-08-10 NOTE — Telephone Encounter (Signed)
Spoke to patient and his son. He is due for re-enrollement with [employer] for his Trulicity 4.0HW. He currently took his last dose earlier this week and needs more pens. He says he can come in today to pickup some sample pens we have for him. He is also going to sign a new [employer]/film/video.    Medication Samples have been pulled and verified by [employer] and left in fridge for patient pickup.  Drug name: trulicity       Strength: 1.31m/0.5ml        Qty: 4 pens  LKGS:[id number]  Exp.Date: 02/2021  Dosing instructions: INJECT 1.5MG INTO THE SKIN ONCE WEEKLY    CVista Deck9:51 AM 08/10/2020

## 2020-08-28 NOTE — Progress Notes (Signed)
Received notification from [facility] regarding approval for TRULICITY 1.1BJ/4.7WG. Patient assistance approved from 08/26/2020 to 07/07/21. Medication will ship to patients home from [place].  Phone: [phone]  [facility]: [phone]

## 2020-08-29 DIAGNOSIS — H00014 Hordeolum externum left upper eyelid: Secondary | ICD-10-CM | POA: Diagnosis not present

## 2020-09-13 DIAGNOSIS — Z0000 Encounter for general adult medical examination without abnormal findings: Secondary | ICD-10-CM | POA: Insufficient documentation

## 2020-09-13 NOTE — Progress Notes (Signed)
Dylan [name] is here for routine follow-up, last visit fall 2021.  He is here with his son.  He reports that he has been doing reasonably well, though is concerned that he has been experiencing progressively worsening shortness of breath, for which he is using his home oxygen more regularly (and almost every night).  He does not routinely check his pulse ox, and applies the oxygen based on his symptoms rather than a particular reading.  The oxygen does help relieve his symptoms.  Shortness of breath is noticed mostly with exertion, not when supine.  He has an occasional nonproductive cough, no chest pain.  No lower extremity edema and is not felt that he is carrying extra water weight.  He requests a portable oxygen unit which he saw advertised on the television.  He is experiencing no claudication or problems with foot pain numbness or tingling.  He has been experiencing some hypoglycemic episodes which he does not sense-numbers have been below 100, he thinks may be some below 80s.  I have not yet seen his glucose readings.  He continues to have difficulty with voiding frequency, urge incontinence, and slow stream.  There has been no dysuria or lower pelvic discomfort.  He recalls a diagnosis of BPH in the late 2000 for which he saw urologist and underwent testing of some type.  He has not seen a urologist since.  He agrees to a referral.  He makes mention that his urine color has been darker than usual, "between orange soda and tea".  There is no bubbling or froth.  He does not usually drink much fluids and is on diuretic.  He saw his eye specialist but cannot recall the name.  Diagnosed with dry eye syndrome.  No mention of diabetic retinopathy.  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  UTD on Covid vaccination.  Patient Active Problem List   Diagnosis Date Noted  . Healthcare maintenance 09/13/2020  . Urge urinary incontinence 06/29/2020  . Refractory obstruction  of nasal airway 04/27/2020  . Aortic atherosclerosis ([place]) 03/28/2020  . Obesity (BMI 30.0-34.9) 03/09/2020  . Chronic kidney disease, stage 3b ([place]) 03/09/2020  . Coronary artery disease without angina pectoris 04/04/2018  . 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 Atherosclerosis 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 ([place]) 12/28/2014  . Gout 10/19/2014  . 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 Severe pulmonary arterial systolic hypertension ([place]) 10/18/2014  . 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 Resistant hypertension 10/09/2014  . OSA (obstructive sleep apnea) 10/08/2014  . Former tobacco use 10/08/2014  . Personal history of colonic polyps 03/28/2014  . DM (diabetes mellitus), type 2 with renal complications ([place]) 19/75/8832    Current Outpatient Medications:  .  aspirin (BAYER LOW DOSE) 81 MG EC tablet, Take 81 mg by mouth daily. Swallow whole., Disp: , Rfl:  .  atorvastatin (LIPITOR) 40 MG tablet, Take 1 tablet (40 mg total) by mouth daily., Disp: 90 tablet, Rfl: 3 .  Dulaglutide (TRULICITY) 1.5 PQ/9.8YM SOPN, Inject 0.5 mLs (1.5 mg total) into the skin once a week., Disp: 15 pen, Rfl: 1 .  eplerenone (INSPRA) 50 MG tablet, Take 1 tablet (50 mg total) by mouth 2 (two) times daily., Disp: 180 tablet, Rfl: 3 .  FREESTYLE LITE test strip, Use to check blood sugar 3 times daily, DIAG CODE E11.29, INSULIN DEPENDENT, Disp: 300 each, Rfl: 1 .  hydrALAZINE (APRESOLINE) 50 MG tablet, Take 2 tablets (100 mg total) by mouth 3 (three) times daily., Disp: 540 tablet, Rfl: 3 .  Insulin Pen Needle (PEN NEEDLES) 31G X 5 MM MISC, Inject 1 Dose into the skin daily. With Victoza DIAG CODE E11.29. INSULIN DEPENDENT, Disp: 100 each, Rfl: 1 .  labetalol (NORMODYNE) 200 MG tablet, Take 2 tablets (400 mg total) by mouth 2 (two) times daily., Disp: 360 tablet, Rfl: 3 .  minoxidil (LONITEN) 10 MG tablet,  Take 2 tablets (20 mg total) by mouth daily., Disp: 180 tablet, Rfl: 3 .  polyethylene glycol (MIRALAX / GLYCOLAX) packet, Take 17 g by mouth daily as needed (constipation). , Disp: , Rfl:  .  torsemide (DEMADEX) 10 MG tablet, Take 1 tablet in morning and 1 tablet in the evening. If your weight is up by 3lbs in 1 day, take another dose, Disp: 210 tablet, Rfl: 2 .  verapamil (VERELAN PM) 360 MG 24 hr capsule, Take 1 capsule (360 mg total) by mouth daily., Disp: 90 capsule, Rfl: 3  Medications were brought by patient and are reviewed.  He is taking as prescribed.  The only discrepancy is that he brought in a nearly empty bottle of allopurinol and wondered why it has been discontinued.  I reminded him that he had discontinued it and had not had any problems with gout and that because of that he may be able to remain off at this time.  He agreed.  BP (!) 116/48 (BP Location: Right Arm, Patient Position: Sitting, Cuff Size: Small)   Pulse (!) 59   Temp 98.4 F (36.9 C) (Oral)   Ht _0  (1.676 m)   Wt 201 lb 14.4 oz (91.6 kg)   SpO2 98%   BMI 32.59 kg/m   Dylan [name] is nicely dressed and groomed, alert and oriented and converses easily about his medical problems.  His chronic proptosis (most prominent left eye) and conjunctival injection with tearing are again noted.  Breathing comfortably on 2 L of oxygen in the transport chair using no accessory muscles.  There is no JVD in the sitting position and no lower extremity edema.  Lungs with distant breath sounds, no rales.  Heart regular rate and rhythm with systolic murmur and a gallop present which is chronic.  Skin turgor is decreased generally though there is minimal tenting of the skin over upper anterior chest.  Assessment and Plan (see also problem based documentation):  Increasing shortness of breath has multiple potential etiologies including progression of his severe pulmonary hypertension (untreated OSA by patient preference), though there is  no JVD or lower extremity edema to suggest clinical right heart failure at this time.  No rales on exam to suggest this is worsening heart failure although his increased BMI may interfere with the exam.  He does not have underlying lung disease to his knowledge.  We will evaluate with 2D echocardiogram and 2V CXR.  No increase in diuretic, as his blood pressure appears to be somewhat low for his baseline, and given his description of concentrated urine-BMP is also ordered for today.  T2DM is overly controlled with P9J of 5.7 on Trulicity 1.5 mg weekly.  Will decrease dose to 0.75 mg weekly.  BPH and urge incontinence; refer to urology.

## 2020-09-14 ENCOUNTER — Ambulatory Visit (INDEPENDENT_AMBULATORY_CARE_PROVIDER_SITE_OTHER): Payer: Medicare Other | Admitting: Internal Medicine

## 2020-09-14 ENCOUNTER — Encounter: Payer: Self-pay | Admitting: Internal Medicine

## 2020-09-14 VITALS — BP 116/48 | HR 59 | Temp 98.4°F | Ht 66.0 in | Wt 201.9 lb

## 2020-09-14 DIAGNOSIS — Z0000 Encounter for general adult medical examination without abnormal findings: Secondary | ICD-10-CM | POA: Diagnosis not present

## 2020-09-14 DIAGNOSIS — N1832 Chronic kidney disease, stage 3b: Secondary | ICD-10-CM | POA: Diagnosis not present

## 2020-09-14 DIAGNOSIS — H04123 Dry eye syndrome of bilateral lacrimal glands: Secondary | ICD-10-CM

## 2020-09-14 DIAGNOSIS — I10 Essential (primary) hypertension: Secondary | ICD-10-CM | POA: Diagnosis not present

## 2020-09-14 DIAGNOSIS — R0600 Dyspnea, unspecified: Secondary | ICD-10-CM

## 2020-09-14 DIAGNOSIS — Z87438 Personal history of other diseases of male genital organs: Secondary | ICD-10-CM

## 2020-09-14 DIAGNOSIS — I2721 Secondary pulmonary arterial hypertension: Secondary | ICD-10-CM

## 2020-09-14 DIAGNOSIS — R0609 Other forms of dyspnea: Secondary | ICD-10-CM | POA: Insufficient documentation

## 2020-09-14 DIAGNOSIS — N3941 Urge incontinence: Secondary | ICD-10-CM | POA: Diagnosis not present

## 2020-09-14 DIAGNOSIS — E1121 Type 2 diabetes mellitus with diabetic nephropathy: Secondary | ICD-10-CM | POA: Diagnosis not present

## 2020-09-14 LAB — POCT GLYCOSYLATED HEMOGLOBIN (HGB A1C): Hemoglobin A1C: 5.7 % — AB (ref 4.0–5.6)

## 2020-09-14 LAB — POCT URINALYSIS DIPSTICK
Bilirubin, UA: NEGATIVE
Blood, UA: NEGATIVE
Glucose, UA: NEGATIVE
Ketones, UA: NEGATIVE
Leukocytes, UA: NEGATIVE
Nitrite, UA: NEGATIVE
Protein, UA: NEGATIVE
Spec Grav, UA: 1.015 (ref 1.010–1.025)
Urobilinogen, UA: 0.2 E.U./dL
pH, UA: 5.5 (ref 5.0–8.0)

## 2020-09-14 LAB — GLUCOSE, CAPILLARY: Glucose-Capillary: 112 mg/dL — ABNORMAL HIGH (ref 70–99)

## 2020-09-14 MED ORDER — TRULICITY 0.75 MG/0.5ML ~~LOC~~ SOAJ: 0.7500 mg | SUBCUTANEOUS | 3 refills | Status: AC

## 2020-09-14 MED ORDER — CYCLOSPORINE 0.05 % OP EMUL: 1.0000 [drp] | Freq: Two times a day (BID) | OPHTHALMIC | 1 refills | Status: AC

## 2020-09-14 NOTE — Patient Instructions (Addendum)
Mr. Bubeck, It was great to catch up with you today!   I'm sorry to hear that your breathing has not been comfortable.  We discussed evaluating this further with a chest x-ray and with a echocardiogram.  This will be scheduled for you in the future; you should receive a phone call about scheduling an appointment.  We discussed the difficulty you are having with your bladder function, and you will be referred to a urologist to discuss options.  Your blood sugar control is excellent, and actually more tightly controlled than what is recommended.  I will investigate whether we can decrease the dose of Trulicity or whether or not we need to stop it altogether.  When I call you to share your blood test results, I will advise on what to recommend.  Your insurance company determines what type of oxygen equipment you are eligible for, but I will do my best to see if you can receive a more portable unit.  Make sure that you get to your [place] [facility] appointment which I believe should be scheduled around March or April.  Lets get together in 3 months, earlier if needed.  I will plan to follow-up with a phone call for your lab test results.  Take care and stay well, [name]

## 2020-09-14 NOTE — Assessment & Plan Note (Signed)
While there are no signs of right heart failure, he is experiencing increased exertional dyspnea and is more oxygen dependent than previously.  Repeat 2D echocardiogram.

## 2020-09-14 NOTE — Assessment & Plan Note (Signed)
BMP today.  Follow-up with [facility] upcoming.

## 2020-09-14 NOTE — Assessment & Plan Note (Signed)
Tight control today at 116/48 in sitting position.  Follow-up BMP to ensure he is not developing AKI.  [facility] appointment upcoming.

## 2020-09-14 NOTE — Assessment & Plan Note (Signed)
See documentation for BPH.  Referring to urology.

## 2020-09-14 NOTE — Assessment & Plan Note (Signed)
Experiencing slow stream, urinary frequency with urge incontinence, and was patient reporting history of BPH many years ago it is appropriate to refer for urologic evaluation.  He agrees.

## 2020-09-14 NOTE — Assessment & Plan Note (Signed)
overly controlled with X5Q of 5.7 on Trulicity 1.5 mg weekly.  Will decrease dose to 0.75 mg weekly.

## 2020-09-15 LAB — LIPID PANEL
Chol/HDL Ratio: 3.3 ratio (ref 0.0–5.0)
Cholesterol, Total: 125 mg/dL (ref 100–199)
HDL: 38 mg/dL — ABNORMAL LOW (ref >39)
LDL Chol Calc (NIH): 75 mg/dL (ref 0–99)
Triglycerides: 57 mg/dL (ref 0–149)
VLDL Cholesterol Cal: 12 mg/dL (ref 5–40)

## 2020-09-15 LAB — BMP8+ANION GAP
Anion Gap: 17 mmol/L (ref 10.0–18.0)
BUN/Creatinine Ratio: 15 (ref 10–24)
BUN: 33 mg/dL — ABNORMAL HIGH (ref 8–27)
CO2: 21 mmol/L (ref 20–29)
Calcium: 9 mg/dL (ref 8.6–10.2)
Chloride: 107 mmol/L — ABNORMAL HIGH (ref 96–106)
Creatinine, Ser: 2.2 mg/dL — ABNORMAL HIGH (ref 0.76–1.27)
Glucose: 111 mg/dL — ABNORMAL HIGH (ref 65–99)
Potassium: 4.1 mmol/L (ref 3.5–5.2)
Sodium: 145 mmol/L — ABNORMAL HIGH (ref 134–144)
eGFR: 30 mL/min/{1.73_m2} — ABNORMAL LOW (ref >59)

## 2020-10-19 ENCOUNTER — Other Ambulatory Visit: Payer: Self-pay

## 2020-10-19 ENCOUNTER — Telehealth: Payer: Self-pay

## 2020-10-19 ENCOUNTER — Telehealth: Payer: Self-pay | Admitting: Internal Medicine

## 2020-10-19 ENCOUNTER — Ambulatory Visit (INDEPENDENT_AMBULATORY_CARE_PROVIDER_SITE_OTHER): Payer: Medicare Other | Admitting: Internal Medicine

## 2020-10-19 DIAGNOSIS — M1000 Idiopathic gout, unspecified site: Secondary | ICD-10-CM

## 2020-10-19 MED ORDER — PREDNISONE 10 MG PO TABS: ORAL_TABLET | ORAL | 0 refills | Status: AC

## 2020-10-19 NOTE — Telephone Encounter (Signed)
Call patient and patient's son.  Discussed his prescription for prednisone.  Instructed them to increase CBG checks daily at least twice daily.  Instructed them to call the clinic immediately if blood sugars are greater than 200 so that we can prescribe him insulin while he is on his steroid.  I also instructed them to be on the look out for increased swelling and shortness of breath as he has heart failure.  They admit to understanding and agree to follow-up in the clinic in roughly a week for further evaluation.  Lawerance Cruel, D.O.  Internal Medicine Resident, PGY-2 [facility]  Pager: [phone] 4:18 PM, 10/19/2020

## 2020-10-19 NOTE — Progress Notes (Signed)
I connected with  Dylan [name] on 10/21/20 by telephone and verified that I am speaking with the correct person using two identifiers.   I discussed the limitations of evaluation and management by telemedicine. The patient expressed understanding and agreed to proceed.     CC: gout  This is a telephone encounter between Dylan [name] and Dylan [name] on 10/21/2020 for gout. The visit was conducted with the patient located at home and Dylan [name] at [facility]. The patient's identity was confirmed using their DOB and current address. The patient has consented to being evaluated through a telephone encounter and understands the associated risks (an examination cannot be done and the patient may need to come in for an appointment) / benefits (allows the patient to remain at home, decreasing exposure to coronavirus). I personally spent 8 minutes on medical discussion.   HPI:  Dylan [name] is a 75 y.o. with PMH as below.   Please see A&P for assessment of the patient's acute and chronic medical conditions.   Past Medical History:  Diagnosis Date  . Anemia 10/18/2014   Baseline about 12 and stable from 2010 to 2016. Colon 2009 in [place] (records cannot be obtained).  EGD Dr Benson Norway 2011 nl   . Aortic atherosclerosis ([place]) 03/28/2020   Incidental finding on imaging CT   . Atherosclerosis 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 ([place]) 12/28/2014   ABI Feb 2017 R 0.49; L 0.72 with diffuse dz ABI Aug 2017 R 0.49; L 0.69 ABI Jan 2018 R 0.61; L 0.73  ABI Feb 2019 R 0.55; L 0.71 Sees Dr Bridgett Larsson - recs ABI q 6 months  . Chronic diastolic 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 ([place]) 10/18/2014   Noted ECHO 10/2014. Grade 2. EF 50-55%; echo repeated 2019, severe hypertrophy with elevated filling pressures  . Chronic kidney disease, stage 3b ([place]) 03/09/2020   Dr. Justin Mend  Nephrologist, f/u Q 89M  . Coronary artery disease without angina pectoris 04/04/2018  . Degloving injury of finger  03/28/2020   10/21/19: copied from op note "1.  Open reduction percutaneous pinning left small finger proximal phalanx fracture 2.  Complex repair of laceration to left small finger 3 cm in length 3.  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 No significant callus formation is noted. Pins had been removed  . Diverticulosis 10/08/2014   Seen on CT. Reportedly on Colon in [place] in 2009. Freq bouts of diverticulitis.  . DM (diabetes mellitus), type 2 with renal complications ([place]) 10/12/4257  . Former tobacco use 10/08/2014  . Gout   . 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 Stable from 2010 to 2016.  Negative SPEP and UPEP 2014 after ARF 2/2 continued ACE (lisinopril 20) use while vol contracted. Dr Justin Mend  . Obesity (BMI 30.0-34.9) 03/09/2020  . 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 OSA (obstructive sleep apnea) 10/08/2014   July 2016 : Severe OSA/hypopnea syndrome, AHI 128.4, O2 nadir of 84% RA. Failed CPAP on study. BiPA inspiratory pressure of 21 and expiratory pressure of 17 CWP. Consider ENT evaluation for potentially correctable upper airway obstruction contributing to the need for unusually high pressures - ENT July 2015 did not feel any intervention surgically was indicated. He wore a large F&P Simplus fullfac  . Personal history of colonic polyps 03/28/2014   Dr. Benson Norway, 2 polyps  removed 2015.  No polyps on f/u in 2020.  No further surveillance suggested per Dr. Benson Norway.  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  He is unable to breathe through his nose comfortably.  This is part of the reason why he does not wear his oxygen regularly.  Nasal passages are nearly completely obstructed erythematous smooth glistening surface.  He has  been told by his eye doctor that part of the reason his e  . Resistant hypertension 10/09/2014   Poor control with 6 drug therapy. 2016 : Aldo 37 but ARR 23.5   . Severe pulmonary arterial systolic hypertension ([place]) 10/18/2014   Noted as severe ECHO 2016 and 2019. Likely 2/2 severe untreated OSA. Pt is not adherent to CPAP.  Marland Kitchen Thrombocytopenia ([place]) 10/09/2016   Nl liver and spleen on [place] 2019  . Ulcer aphthous oral 04/27/2020   Evaluated emergency room last week, saw with Magic mouthwash   Review of Systems: on a/p    Assessment & Plan:   See Encounters Tab for problem based charting.  Patient discussed with Dr. Dareen Piano

## 2020-10-19 NOTE — Telephone Encounter (Signed)
Pls contact pt regarding gout flare up [phone]

## 2020-10-19 NOTE — Telephone Encounter (Signed)
Perfect. Thank you.

## 2020-10-19 NOTE — Telephone Encounter (Signed)
Called pt back - c/o gout flare-up x 2 weeks and his feet are swollen. I asked if he has been taken any med for the gout, his son Dylan [name] got on the phone and stated he had been on Allopurinol. Allopurinol was discontinued on 12/23; son stated he needs a refill. Offered pt an in-person appt but stated it may be difficult to get him here. So I offered telehealth appt - Dylan [name] stated this will be better Telehealth appt schedule today with Dr Marianna Payment @ 1415 PM- instructed to have his phone close by.

## 2020-10-21 ENCOUNTER — Encounter: Payer: Self-pay | Admitting: Internal Medicine

## 2020-10-21 NOTE — Assessment & Plan Note (Addendum)
Patient presents with signs and symptoms concerning for gout in his ankles bilaterally. He admits to pain and swelling. He denies any fevers or chill. The pain makes it difficult to ambulate. His son is at home to assist him.   In the past the patient has a history of gout with elevated uric acid. He tolerate treatment for a while without a gout flare and therefore he discontinued uric acid lowering medications.   I counseled the patient that he will not be able to start NSAIDs, or colchicine due to his kidney function.   Plan: - 5 day prednisone taper - counseled regarding checking his blood sugar and to call if sugar is greater than 200 - counseled regarding fluid retention and SHOB as a side effect to look out for.  - f/u in 1 week

## 2020-10-23 ENCOUNTER — Other Ambulatory Visit: Payer: Self-pay

## 2020-10-23 DIAGNOSIS — I10 Essential (primary) hypertension: Secondary | ICD-10-CM

## 2020-10-23 NOTE — Progress Notes (Signed)
Internal Medicine Clinic Attending  Case discussed with Dr. Marianna Payment  At the time of the visit.  We reviewed the resident's history and exam and pertinent patient test results.  I agree with the assessment, diagnosis, and plan of care documented in the resident's note.

## 2020-10-25 MED ORDER — HYDRALAZINE HCL 50 MG PO TABS
100.0000 mg | ORAL_TABLET | Freq: Three times a day (TID) | ORAL | 3 refills | Status: DC
Start: 2020-10-25 — End: 2020-11-28

## 2020-10-26 DIAGNOSIS — R351 Nocturia: Secondary | ICD-10-CM | POA: Diagnosis not present

## 2020-10-26 DIAGNOSIS — N3941 Urge incontinence: Secondary | ICD-10-CM | POA: Diagnosis not present

## 2020-10-26 DIAGNOSIS — R350 Frequency of micturition: Secondary | ICD-10-CM | POA: Diagnosis not present

## 2020-10-26 DIAGNOSIS — N401 Enlarged prostate with lower urinary tract symptoms: Secondary | ICD-10-CM | POA: Diagnosis not present

## 2020-11-05 IMAGING — CR DG HAND COMPLETE 3+V*L*
3 series · 3 of 3 positions shown · non-contrast
Comparison: November 04, 2019

CLINICAL DATA: Postoperative evaluation.

EXAM:
LEFT HAND - COMPLETE 3+ VIEW

[hand pa]
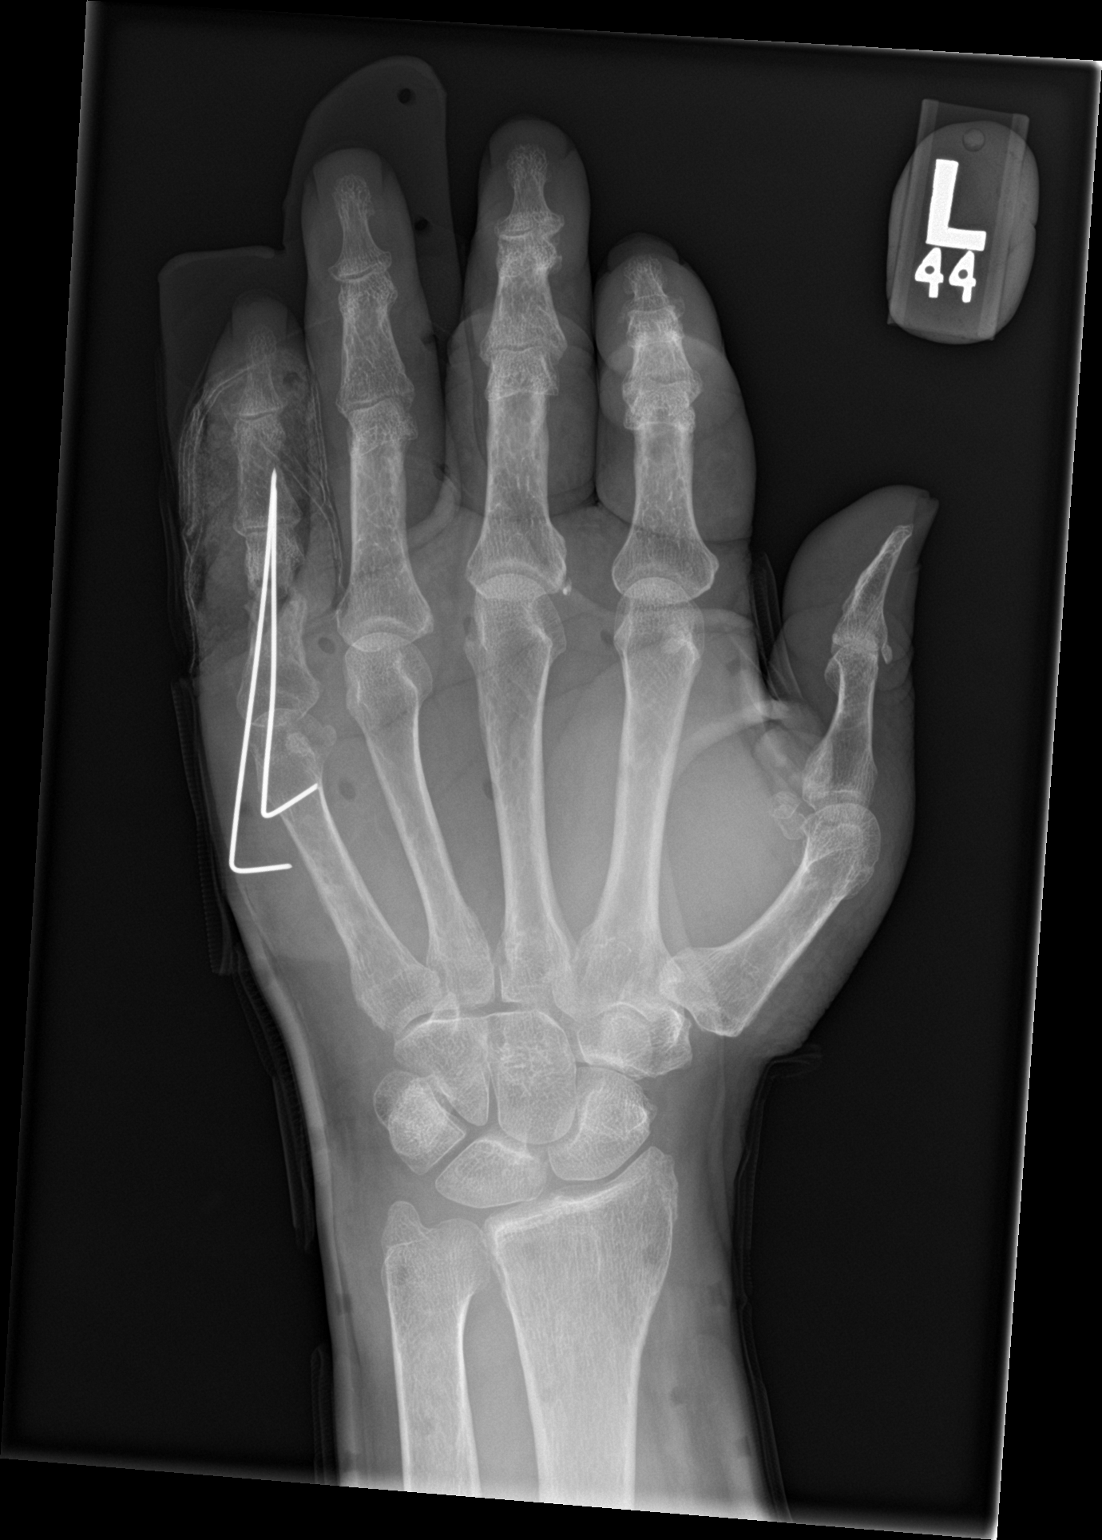

[hand obl]
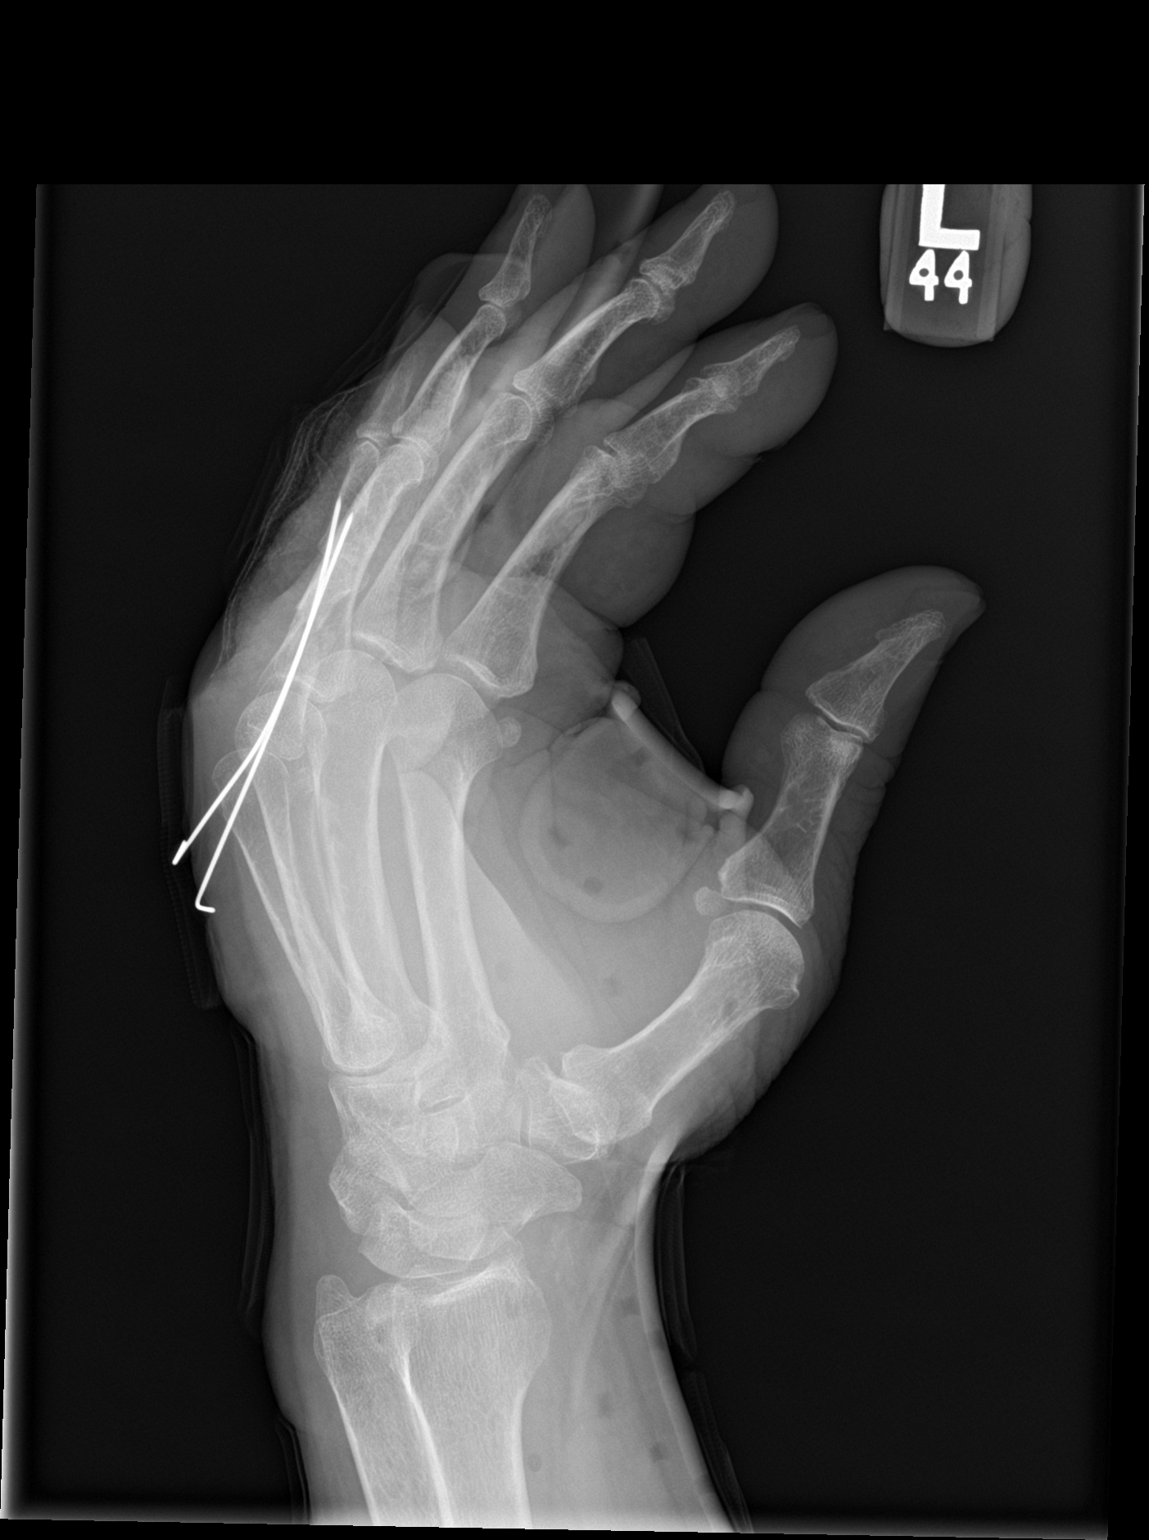

[hand lat]
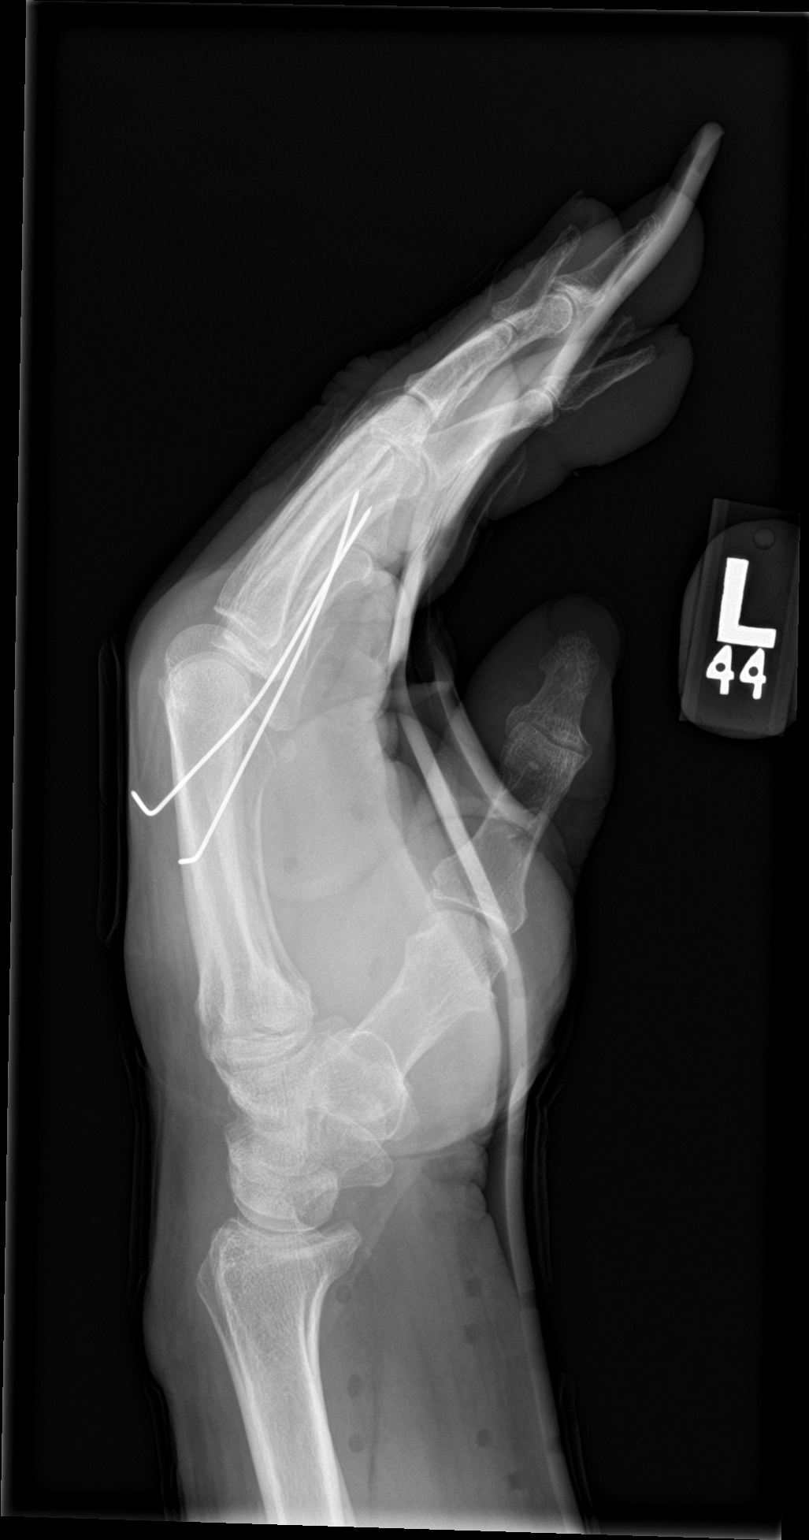

[3 of 3 positions shown; findings below may reference images not displayed]

FINDINGS: Two radiopaque surgical pins are seen within the proximal and middle
phalanx of the fifth left finger. These are seen on the prior study.
A fracture deformity of the proximal phalanx of the fifth left
finger is also seen with gross anatomic alignment. There is no
evidence of dislocation. There is no evidence of arthropathy or
other focal bone abnormality. Diffuse soft tissue swelling is noted.
IMPRESSION: Postoperative changes within the proximal and middle phalanx of the
fifth left finger, as described above.

## 2020-11-07 ENCOUNTER — Telehealth: Payer: Self-pay

## 2020-11-07 NOTE — Telephone Encounter (Signed)
Pt's son requesting to speak with a nurse about prednisone not working. Please call back.

## 2020-11-07 NOTE — Telephone Encounter (Signed)
Talked to pt's son, Darlyn Chamber. Stated pt took Prednisone as prescribed for gout. Stated it decreased the pain and swelling for a few days only.  Stated his left foot is currently swollen. Requesting a telehealth appt - informed pt's son, I will have front office to call him tomorrow to schedule the appt.

## 2020-11-08 NOTE — Telephone Encounter (Signed)
Called pt's son, Darlyn Chamber, to schedule a telehealth appt for his father to discuss gout medication. Call transferred to front office - appt scheduled tomorrow 5/5 with Dr Alfonse Spruce.

## 2020-11-09 ENCOUNTER — Telehealth: Payer: Self-pay | Admitting: *Deleted

## 2020-11-09 ENCOUNTER — Ambulatory Visit (INDEPENDENT_AMBULATORY_CARE_PROVIDER_SITE_OTHER): Payer: Medicare Other | Admitting: Student

## 2020-11-09 ENCOUNTER — Telehealth: Payer: Self-pay

## 2020-11-09 ENCOUNTER — Other Ambulatory Visit: Payer: Self-pay

## 2020-11-09 DIAGNOSIS — M1000 Idiopathic gout, unspecified site: Secondary | ICD-10-CM

## 2020-11-09 MED ORDER — PREDNISONE 10 MG PO TABS
ORAL_TABLET | ORAL | 0 refills | Status: DC
Start: 2020-11-09 — End: 2020-11-23

## 2020-11-09 NOTE — Telephone Encounter (Signed)
Called patient, unable to leave message / no voice set up. Called to do patient's tele health questions.

## 2020-11-09 NOTE — Telephone Encounter (Signed)
Pt's son requesting PCS for pt, please call back.

## 2020-11-09 NOTE — Progress Notes (Signed)
[facility] Telephone Encounter Continuity Care Appointment  HPI:   This telephone encounter was created for Dylan [name] on 11/09/2020 for the following purpose/cc acute gout flare.   Past Medical History:  Past Medical History:  Diagnosis Date  . Anemia 10/18/2014   Baseline about 12 and stable from 2010 to 2016. Colon 2009 in [place] (records cannot be obtained).  EGD Dr Benson Norway 2011 nl   . Aortic atherosclerosis ([place]) 03/28/2020   Incidental finding on imaging CT   . Atherosclerosis 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 ([place]) 12/28/2014   ABI Feb 2017 R 0.49; L 0.72 with diffuse dz ABI Aug 2017 R 0.49; L 0.69 ABI Jan 2018 R 0.61; L 0.73  ABI Feb 2019 R 0.55; L 0.71 Sees Dr Bridgett Larsson - recs ABI q 6 months  . 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 Grade 2. EF 50-55%; echo repeated 2019, severe hypertrophy with elevated filling pressures  . Chronic kidney disease, stage 3b ([place]) 03/09/2020   Dr. Justin Mend  Nephrologist, f/u Q 13M  . Coronary artery disease without angina pectoris 04/04/2018  . Degloving injury of finger 03/28/2020   10/21/19: copied from op note "1.  Open reduction percutaneous pinning left small finger proximal phalanx fracture 2.  Complex repair of laceration to left small finger 3 cm in length 3.  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 No significant callus formation is noted. Pins had been removed  . Diverticulosis 10/08/2014   Seen on CT. Reportedly on Colon in [place] in 2009. Freq bouts of diverticulitis.  . DM (diabetes mellitus), type 2 with renal complications ([place]) 09/07/3555  . Former tobacco use 10/08/2014  . Gout   . 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 Stable from 2010 to 2016.  Negative SPEP and UPEP  2014 after ARF 2/2 continued ACE (lisinopril 20) use while vol contracted. Dr Justin Mend  . Obesity (BMI 30.0-34.9) 03/09/2020  . 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 OSA (obstructive sleep apnea) 10/08/2014   July 2016 : Severe OSA/hypopnea syndrome, AHI 128.4, O2 nadir of 84% RA. Failed CPAP on study. BiPA inspiratory pressure of 21 and expiratory pressure of 17 CWP. Consider ENT evaluation for potentially correctable upper airway obstruction contributing to the need for unusually high pressures - ENT July 2015 did not feel any intervention surgically was indicated. He wore a large F&P Simplus fullfac  . Personal history of colonic polyps 03/28/2014   Dr. Benson Norway, 2 polyps removed 2015.  No polyps on f/u in 2020.  No further surveillance suggested per Dr. Benson Norway.  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  He is unable to breathe through his nose comfortably.  This is part of the reason why he does not wear his oxygen regularly.  Nasal passages are nearly completely obstructed erythematous smooth glistening surface.  He has been told by his eye doctor that part of the reason his e  . Resistant hypertension 10/09/2014   Poor control with 6 drug therapy. 2016 : Aldo 37 but ARR 23.5   . Severe pulmonary arterial systolic hypertension ([place]) 10/18/2014   Noted as severe ECHO 2016 and 2019. Likely 2/2  severe untreated OSA. Pt is not adherent to CPAP.  Marland Kitchen Thrombocytopenia ([place]) 10/09/2016   Nl liver and spleen on [place] 2019  . Ulcer aphthous oral 04/27/2020   Evaluated emergency room last week, saw with Magic mouthwash      ROS:  Review of Systems  Constitutional: Negative for chills and fever.  Musculoskeletal: Positive for joint pain.     Assessment / Plan / Recommendations:   Please see A&P under problem oriented charting for assessment of the patient's acute and chronic medical  conditions.   As always, pt is advised that if symptoms worsen or new symptoms arise, they should go to an urgent care facility or to to ER for further evaluation.   Consent and Medical Decision Making:   Patient discussed with [name]  This is a telephone encounter between Canary Brim and Dylan [name] on 11/09/2020 for acute gout flare. The visit was conducted with the patient located at home and Dylan [name] at [facility]. The patient's identity was confirmed using their DOB and current address. The patient has consented to being evaluated through a telephone encounter and understands the associated risks (an examination cannot be done and the patient may need to come in for an appointment) / benefits (allows the patient to remain at home, decreasing exposure to coronavirus). I personally spent 17 minutes on medical discussion.

## 2020-11-09 NOTE — Assessment & Plan Note (Signed)
Patient reports acute gout flares of bilateral feet and ankles started 2 months ago.  States this feels like past gout attack but lasted longer.  He denies fever, chills, pain at other joints.  He had a telehealth visit on 4/14 and was prescribed 5 days of prednisone 30 mg.  States that he finished the course and felt better for a few days.  However the pain came back and persisted until now.  Rated pain 9/10.  States that his pain feels a little bit better today.  Still require assistant with walking.  Assessment and plan Symptoms consistent with gout attack.  Patient did feel better with a short course of steroid.  He will need a longer course of steroid given his morbidities.  Will start prednisone 30 mg daily for 7 days and taper down for the next 7 days.  No NSAIDs or colchicine due to his CKD 3B.  Patient was on allopurinol in the past and fell off.  Uric acid level was 7.8 in 2018.  Patient will need BMP and uric acid level checked at next visit and possible restarting allopurinol.  -Prednisone 30 mg for 7-day and taper the neck 7 days. -Follow-up with [name] on May 12 at 8:45 AM

## 2020-11-09 NOTE — Telephone Encounter (Signed)
Patient does not have MCD so PCS would be an out of pocket expense. Patient had tele appt today. Will ask Provider if PCS was discussed.

## 2020-11-13 NOTE — Telephone Encounter (Signed)
Spoke with patient regarding PCS, patient states he is doing much better, up walking around. Informed patient that he do not have Medicaid, which would cover PCS, there fore he would have to pay out of pocket if he is still wanting PCS. He understood, and that he and his son were just talking about it. Instructed patient to call if he had any other concerns.

## 2020-11-15 NOTE — Progress Notes (Deleted)
Gout, f/u bmp, blood sugar (stop or decrease Rx?), breathing, echo ???

## 2020-11-16 ENCOUNTER — Encounter: Payer: 59 | Admitting: Internal Medicine

## 2020-11-21 ENCOUNTER — Encounter: Payer: Self-pay | Admitting: Internal Medicine

## 2020-11-21 ENCOUNTER — Ambulatory Visit (INDEPENDENT_AMBULATORY_CARE_PROVIDER_SITE_OTHER): Payer: Medicare Other | Admitting: Internal Medicine

## 2020-11-21 ENCOUNTER — Inpatient Hospital Stay (HOSPITAL_COMMUNITY)
Admission: AD | Admit: 2020-11-21 | Discharge: 2020-11-23 | DRG: 378 | Disposition: A | Discharge status: Home / Self Care | Payer: Medicare Other | Source: Ambulatory Visit | Attending: Infectious Diseases | Admitting: Infectious Diseases

## 2020-11-21 ENCOUNTER — Telehealth: Payer: Self-pay | Admitting: *Deleted

## 2020-11-21 VITALS — BP 121/62 | HR 73 | Temp 99.1°F | Wt 167.9 lb

## 2020-11-21 DIAGNOSIS — Z888 Allergy status to other drugs, medicaments and biological substances status: Secondary | ICD-10-CM

## 2020-11-21 DIAGNOSIS — K5730 Diverticulosis of large intestine without perforation or abscess without bleeding: Secondary | ICD-10-CM | POA: Diagnosis not present

## 2020-11-21 DIAGNOSIS — K922 Gastrointestinal hemorrhage, unspecified: Secondary | ICD-10-CM | POA: Diagnosis present

## 2020-11-21 DIAGNOSIS — E1151 Type 2 diabetes mellitus with diabetic peripheral angiopathy without gangrene: Secondary | ICD-10-CM | POA: Diagnosis present

## 2020-11-21 DIAGNOSIS — Z9981 Dependence on supplemental oxygen: Secondary | ICD-10-CM | POA: Diagnosis not present

## 2020-11-21 DIAGNOSIS — Z6827 Body mass index (BMI) 27.0-27.9, adult: Secondary | ICD-10-CM

## 2020-11-21 DIAGNOSIS — K5791 Diverticulosis of intestine, part unspecified, without perforation or abscess with bleeding: Secondary | ICD-10-CM | POA: Diagnosis present

## 2020-11-21 DIAGNOSIS — T380X5A Adverse effect of glucocorticoids and synthetic analogues, initial encounter: Secondary | ICD-10-CM | POA: Insufficient documentation

## 2020-11-21 DIAGNOSIS — Z8719 Personal history of other diseases of the digestive system: Secondary | ICD-10-CM

## 2020-11-21 DIAGNOSIS — I2510 Atherosclerotic heart disease of native coronary artery without angina pectoris: Secondary | ICD-10-CM | POA: Diagnosis present

## 2020-11-21 DIAGNOSIS — I4510 Unspecified right bundle-branch block: Secondary | ICD-10-CM

## 2020-11-21 DIAGNOSIS — N1832 Chronic kidney disease, stage 3b: Secondary | ICD-10-CM | POA: Diagnosis present

## 2020-11-21 DIAGNOSIS — I130 Hypertensive heart and chronic kidney disease with heart failure and stage 1 through stage 4 chronic kidney disease, or unspecified chronic kidney disease: Secondary | ICD-10-CM | POA: Diagnosis present

## 2020-11-21 DIAGNOSIS — I442 Atrioventricular block, complete: Secondary | ICD-10-CM | POA: Diagnosis not present

## 2020-11-21 DIAGNOSIS — N183 Chronic kidney disease, stage 3 unspecified: Secondary | ICD-10-CM

## 2020-11-21 DIAGNOSIS — R001 Bradycardia, unspecified: Secondary | ICD-10-CM

## 2020-11-21 DIAGNOSIS — E1165 Type 2 diabetes mellitus with hyperglycemia: Secondary | ICD-10-CM | POA: Insufficient documentation

## 2020-11-21 DIAGNOSIS — I440 Atrioventricular block, first degree: Secondary | ICD-10-CM

## 2020-11-21 DIAGNOSIS — M109 Gout, unspecified: Secondary | ICD-10-CM | POA: Diagnosis present

## 2020-11-21 DIAGNOSIS — I422 Other hypertrophic cardiomyopathy: Secondary | ICD-10-CM | POA: Diagnosis present

## 2020-11-21 DIAGNOSIS — E669 Obesity, unspecified: Secondary | ICD-10-CM | POA: Diagnosis present

## 2020-11-21 DIAGNOSIS — E1122 Type 2 diabetes mellitus with diabetic chronic kidney disease: Secondary | ICD-10-CM | POA: Diagnosis present

## 2020-11-21 DIAGNOSIS — G4733 Obstructive sleep apnea (adult) (pediatric): Secondary | ICD-10-CM | POA: Diagnosis present

## 2020-11-21 DIAGNOSIS — Z794 Long term (current) use of insulin: Secondary | ICD-10-CM | POA: Diagnosis not present

## 2020-11-21 DIAGNOSIS — I700 Atherosclerosis of aorta: Secondary | ICD-10-CM | POA: Diagnosis present

## 2020-11-21 DIAGNOSIS — I10 Essential (primary) hypertension: Secondary | ICD-10-CM | POA: Diagnosis not present

## 2020-11-21 DIAGNOSIS — J9610 Chronic respiratory failure, unspecified whether with hypoxia or hypercapnia: Secondary | ICD-10-CM | POA: Diagnosis present

## 2020-11-21 DIAGNOSIS — K625 Hemorrhage of anus and rectum: Secondary | ICD-10-CM

## 2020-11-21 DIAGNOSIS — Z8249 Family history of ischemic heart disease and other diseases of the circulatory system: Secondary | ICD-10-CM | POA: Diagnosis not present

## 2020-11-21 DIAGNOSIS — N1831 Chronic kidney disease, stage 3a: Secondary | ICD-10-CM

## 2020-11-21 DIAGNOSIS — I5032 Chronic diastolic (congestive) heart failure: Secondary | ICD-10-CM | POA: Diagnosis present

## 2020-11-21 DIAGNOSIS — I959 Hypotension, unspecified: Secondary | ICD-10-CM | POA: Diagnosis present

## 2020-11-21 DIAGNOSIS — E861 Hypovolemia: Secondary | ICD-10-CM | POA: Diagnosis present

## 2020-11-21 DIAGNOSIS — R739 Hyperglycemia, unspecified: Secondary | ICD-10-CM | POA: Insufficient documentation

## 2020-11-21 DIAGNOSIS — D62 Acute posthemorrhagic anemia: Secondary | ICD-10-CM | POA: Diagnosis present

## 2020-11-21 HISTORY — DX: Personal history of other diseases of the digestive system: Z87.19

## 2020-11-21 LAB — BASIC METABOLIC PANEL
Anion gap: 9 (ref 5–15)
BUN: 52 mg/dL — ABNORMAL HIGH (ref 8–23)
CO2: 25 mmol/L (ref 22–32)
Calcium: 8.6 mg/dL — ABNORMAL LOW (ref 8.9–10.3)
Chloride: 95 mmol/L — ABNORMAL LOW (ref 98–111)
Creatinine, Ser: 2.02 mg/dL — ABNORMAL HIGH (ref 0.61–1.24)
GFR, Estimated: 34 mL/min — ABNORMAL LOW (ref >60)
Glucose, Bld: 628 mg/dL (ref 70–99)
Potassium: 4.5 mmol/L (ref 3.5–5.1)
Sodium: 129 mmol/L — ABNORMAL LOW (ref 135–145)

## 2020-11-21 LAB — CBC
HCT: 30 % — ABNORMAL LOW (ref 39.0–52.0)
Hemoglobin: 10.1 g/dL — ABNORMAL LOW (ref 13.0–17.0)
MCH: 31 pg (ref 26.0–34.0)
MCHC: 33.7 g/dL (ref 30.0–36.0)
MCV: 92 fL (ref 80.0–100.0)
Platelets: 138 10*3/uL — ABNORMAL LOW (ref 150–400)
RBC: 3.26 MIL/uL — ABNORMAL LOW (ref 4.22–5.81)
RDW: 13.2 % (ref 11.5–15.5)
WBC: 12.3 10*3/uL — ABNORMAL HIGH (ref 4.0–10.5)
nRBC: 0 % (ref 0.0–0.2)

## 2020-11-21 LAB — GLUCOSE, CAPILLARY
Glucose-Capillary: 500 mg/dL — ABNORMAL HIGH (ref 70–99)
Glucose-Capillary: 550 mg/dL (ref 70–99)
Glucose-Capillary: 587 mg/dL (ref 70–99)
Glucose-Capillary: 588 mg/dL (ref 70–99)

## 2020-11-21 LAB — SARS CORONAVIRUS 2 (TAT 6-24 HRS): SARS Coronavirus 2: NEGATIVE

## 2020-11-21 MED ORDER — LACTATED RINGERS IV SOLN: INTRAVENOUS | Status: DC

## 2020-11-21 MED ORDER — INSULIN REGULAR(HUMAN) IN NACL 100-0.9 UT/100ML-% IV SOLN
INTRAVENOUS | Status: DC
  Administered 2020-11-21: 11.5 [IU]/h via INTRAVENOUS
  Filled 2020-11-21 (×2): qty 100

## 2020-11-21 MED ORDER — LACTATED RINGERS IV BOLUS: 1000.0000 mL | Freq: Once | INTRAVENOUS | Status: DC

## 2020-11-21 MED ORDER — INSULIN ASPART 100 UNIT/ML IJ SOLN: 0.0000 [IU] | Freq: Three times a day (TID) | INTRAMUSCULAR | Status: DC

## 2020-11-21 MED ORDER — SODIUM CHLORIDE 0.9% FLUSH
3.0000 mL | Freq: Two times a day (BID) | INTRAVENOUS | Status: DC
  Administered 2020-11-21 – 2020-11-22 (×3): 3 mL via INTRAVENOUS

## 2020-11-21 MED ORDER — CYCLOSPORINE 0.05 % OP EMUL
1.0000 [drp] | Freq: Two times a day (BID) | OPHTHALMIC | Status: DC
  Administered 2020-11-21 – 2020-11-23 (×4): 1 [drp] via OPHTHALMIC
  Filled 2020-11-21 (×6): qty 1

## 2020-11-21 MED ORDER — DEXTROSE IN LACTATED RINGERS 5 % IV SOLN: INTRAVENOUS | Status: DC

## 2020-11-21 MED ORDER — PANTOPRAZOLE SODIUM 40 MG IV SOLR
40.0000 mg | Freq: Two times a day (BID) | INTRAVENOUS | Status: DC
  Administered 2020-11-21 – 2020-11-22 (×2): 40 mg via INTRAVENOUS
  Filled 2020-11-21 (×2): qty 40

## 2020-11-21 MED ORDER — DEXTROSE 50 % IV SOLN
0.0000 mL | INTRAVENOUS | Status: DC | PRN
  Administered 2020-11-22: 35 mL via INTRAVENOUS

## 2020-11-21 MED ORDER — LACTATED RINGERS IV BOLUS
1000.0000 mL | Freq: Once | INTRAVENOUS | Status: AC
  Administered 2020-11-21: 1000 mL via INTRAVENOUS

## 2020-11-21 MED ORDER — ATORVASTATIN CALCIUM 40 MG PO TABS
40.0000 mg | ORAL_TABLET | Freq: Every day | ORAL | Status: DC
  Administered 2020-11-21 – 2020-11-23 (×3): 40 mg via ORAL
  Filled 2020-11-21 (×3): qty 1

## 2020-11-21 NOTE — Assessment & Plan Note (Signed)
Patient reports bright red blood per rectum since yesterday.  He noticed bright red blood in the toilet after bowel movement yesterday and today.  He denies any lightheadedness or dizziness.  Stat CBC showed hemoglobin 12.6-10.1 over 1 year.  Patient is also hyperglycemic and hypotensive.  Last colonoscopy 2020, patient has diverticulosis and expect this is source of bleeding. Patient will be directly admitted to hospital for observation.

## 2020-11-21 NOTE — Consult Note (Addendum)
Reason for Consult: Rectal bleeding. Referring Physician: KAMARION [name] is an 75 y.o. male.  HPI: Dylan [name] is a 75 year old black male, with multiple medical problems listed below, admitted with a history of BRBPR for 2 days prior to admission. He had a colonoscopy done by Dr. Benson [name] in 02/2019 when he presented with similar complaints. He was noted to have pandiverticular disease and was assumed to have a diverticular bleed. He denies having any problems with constipation, abdominal pain, nausea or vomiting. He has 1-3 BM's per day. He was recently given a course of steroids for a flare up of his gout. He denies using any NSAIDS. He was hypotensive in the ER on arrival and was given a bolus of fluids. He denies having any rectal bleeding since admission. He was admitted with a glucose of 628 when an isulin drip was started.  Past Medical History:  Diagnosis Date  . Anemia 10/18/2014   Baseline about 12 and stable from 2010 to 2016. Colon 2009 in [place] (records cannot be obtained).  EGD Dr Dylan [name] 2011 nl   . Aortic atherosclerosis ([place]) 03/28/2020   Incidental finding on imaging CT   . Atherosclerosis 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 ([place]) 12/28/2014   ABI Feb 2017 R 0.49; L 0.72 with diffuse dz ABI Aug 2017 R 0.49; L 0.69 ABI Jan 2018 R 0.61; L 0.73  ABI Feb 2019 R 0.55; L 0.71 Sees Dr Dylan [name] - recs ABI q 6 months  . 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 Grade 2. EF 50-55%; echo repeated 2019, severe hypertrophy with elevated filling pressures  . Chronic kidney disease, stage 3b (Dylan [name]) 03/09/2020   Dr. Justin [name]  Nephrologist, f/u Q 50M  . Coronary artery disease without angina pectoris 04/04/2018  . Degloving injury of finger 03/28/2020   10/21/19: copied from op note "1.  Open reduction percutaneous pinning left small finger proximal phalanx fracture 2.  Complex repair of laceration to left small finger 3 cm in  length 3.  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 No significant callus formation is noted. Pins had been removed  . Diverticulosis 10/08/2014   Seen on CT. Reportedly on Colon in [place] in 2009. Freq bouts of diverticulitis.  . DM (diabetes mellitus), type 2 with renal complications (Dylan [name]) 5/0/0370  . Former tobacco use 10/08/2014  . Gout   . 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 Stable from 2010 to 2016.  Negative SPEP and UPEP 2014 after ARF 2/2 continued ACE (lisinopril 20) use while vol contracted. Dr Dylan [name]  . Obesity (BMI 30.0-34.9) 03/09/2020  . 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 OSA (obstructive sleep apnea) 10/08/2014   July 2016 : Severe OSA/hypopnea syndrome, AHI 128.4, O2 nadir of 84% RA. Failed CPAP on study. BiPA inspiratory pressure of 21 and expiratory pressure of 17 CWP. Consider ENT evaluation for potentially correctable upper airway obstruction contributing to the need for unusually high pressures - ENT July 2015 did not feel any intervention surgically was indicated. He wore a large F&P Simplus fullfac  . Personal history of colonic polyps 03/28/2014   Dr. Benson [name], 2 polyps removed 2015.  No polyps on f/u in 2020.  No further surveillance suggested per Dr. Benson [name].  Marland Kitchen Refractory obstruction of nasal airway 04/27/2020  Chronic problem, evaluated by ENT in the past, with recommendation for surgery which she has been hesitant to consider.  He is unable to breathe through his nose comfortably.  This is part of the reason why he does not wear his oxygen regularly.  Nasal passages are nearly completely obstructed erythematous smooth glistening surface.  He has been told by his eye doctor that part of the reason his e  . Resistant hypertension 10/09/2014   Poor control with 6 drug therapy. 2016 : Aldo 37 but ARR 23.5   . Severe pulmonary arterial  systolic hypertension ([place]) 10/18/2014   Noted as severe ECHO 2016 and 2019. Likely 2/2 severe untreated OSA. Pt is not adherent to CPAP.  Marland Kitchen Thrombocytopenia (Dylan [name]) 10/09/2016   Nl liver and spleen on [place] 2019  . Ulcer aphthous oral 04/27/2020   Evaluated emergency room last week, saw with Magic mouthwash   Past Surgical History:  Procedure Laterality Date  . CHOLECYSTECTOMY    . COLONOSCOPY WITH PROPOFOL N/A 02/12/2019   Procedure: COLONOSCOPY WITH PROPOFOL;  Surgeon: Dylan Ada, MD;  Location: [facility];  Service: Endoscopy;  Laterality: N/A;  . PERCUTANEOUS PINNING Left 10/21/2019   Procedure: CLOSED REDUCTION WITH PERCUTANEOUS PINNING AND SUTURE REPAIR OF LACERATION  OF SMALL FINGER,  SUTURE REPAIR OF LACERATION OF INDEX FINGER;  Surgeon: Dylan Presume, MD;  Location: [facility];  Service: Plastics;  Laterality: Left;   Family History  Problem Relation Age of Onset  . Heart failure Sister        Died of complications Nov 4315  . CAD Sister        CABG x3 while in her 61's   Social History:  reports that he quit smoking about 48 years ago. He has never used smokeless tobacco. He reports that he does not drink alcohol and does not use drugs.  Allergies:  Allergies  Allergen Reactions  . Ace Inhibitors Other (See Comments)    "ARF - see CRF overview"  . Spironolactone Other (See Comments)    Gynecomastia per pt report   Medications: I have reviewed the patient's current medications.  Results for orders placed or performed during the hospital encounter of 11/21/20 (from the past 48 hour(s))  Glucose, capillary     Status: Abnormal   Collection Time: 11/21/20  6:04 PM  Result Value Ref Range   Glucose-Capillary 550 (HH) 70 - 99 mg/dL    Comment: Glucose reference range applies only to samples taken after fasting for at least 8 hours.   Comment 1 Notify RN    Comment 2 Document in Chart    Review of Systems  Constitutional: Negative for activity change, appetite change, chills,  diaphoresis, fatigue, fever and unexpected weight change.  HENT: Negative.   Eyes: Negative.   Respiratory: Negative.   Cardiovascular: Negative.   Gastrointestinal: Positive for anal bleeding and blood in stool. Negative for abdominal distention, abdominal pain, constipation, diarrhea, nausea, rectal pain and vomiting.  Endocrine: Negative.   Genitourinary: Negative.   Musculoskeletal: Positive for arthralgias.  Allergic/Immunologic: Negative.   Neurological: Positive for weakness. Negative for tremors, seizures, syncope, facial asymmetry, speech difficulty, light-headedness, numbness and headaches ( Rectal ).  Hematological: Negative.   Psychiatric/Behavioral: Negative.    Blood pressure (!) 109/59, pulse 73, temperature 98.6 F (37 C), temperature source Oral, height _0  (1.676 m), weight 76.1 kg, SpO2 93 %. Physical Exam Constitutional:      General: He is not in acute distress.    Appearance:  He is obese. He is not toxic-appearing.  HENT:     Head: Normocephalic and atraumatic.     Mouth/Throat:     Mouth: Mucous membranes are dry.  Eyes:     Extraocular Movements: Extraocular movements intact.     Conjunctiva/sclera: Conjunctivae normal.     Pupils: Pupils are equal, round, and reactive to light.  Cardiovascular:     Rate and Rhythm: Normal rate and regular rhythm.     Pulses: Normal pulses.  Pulmonary:     Effort: Pulmonary effort is normal.     Breath sounds: Normal breath sounds.  Abdominal:     General: Abdomen is flat. There is no distension.     Palpations: There is no mass.     Tenderness: There is no abdominal tenderness. There is no guarding.  Musculoskeletal:     Cervical back: Normal range of motion and neck supple.  Skin:    General: Skin is warm and dry.  Neurological:     Mental Status: He is alert and oriented to person, place, and time.   Assessment/Plan: 1) Rectal bleeding with anemia-probably due to a diverticular bleed. His bleeding has stopped  for now. I do not feel he needs any invasive procedures at this time. Advance to a full liquid diet and to a diabetic diet if there is no further signs of bleeding. The patient has been advised to call our office for a follow up visit with Dr. Benson [name] 2 weeks after discharge.  2) AODM-severe hyperglycemia-probably secondary to steroid use. 3) CKD Stage III. 4) Chronic diastolic heart failure.   Juanita Craver 11/21/2020, 8:20 PM

## 2020-11-21 NOTE — Progress Notes (Signed)
Report called to [facility] on [address].  Patient transported via wheelchair to [place].  Oxygen per nasal cannula at 2 liters.  IV right wrist.  Bolus of Lactated Ringers.  Patient accompanied by daughter .  Bing Ree, RN 11/21/2020 4:22 PM.

## 2020-11-21 NOTE — Assessment & Plan Note (Addendum)
Patient has a blood glucose of 628.  Endorses polyuria and polydipsia.  Appears dehydrated on exam.  Patient has had an overly controlled hemoglobin T7G on Trul[place] and expect hyperglycemia is from recent prednisone.  Prednisone was prescribed for gout and patient has finished steroid taper.  BMP checked and does not show metabolic acidosis.  Patient will be admitted to the hospital for observation overnight, Insulin, and IV fluids.  We started patient on 1 L LR in clinic.

## 2020-11-21 NOTE — H&P (Signed)
Date: 11/21/2020               Patient Name:  Dylan [name] MRN: [id number]  DOB: [birth year] Age / Sex: 75 y.o., male   PCP: Angelica Pou, MD         Medical Service: Internal Medicine Teaching Service         Attending Physician: Dr. Campbell Riches, MD    First Contact: Dr. Collene Gobble Pager: [phone]  Second Contact: Dr. Marva Panda Pager: [phone]       After Hours (After 5p/  First Contact Pager: [phone]  weekends / holidays): Second Contact Pager: [phone]   Chief Complaint: rectal bleeding  History of Present Illness:  Dylan [name] is 75yo cismale (he/him) with chronic diastolic heart failure, chronic respiratory failure on 2L supplemental oxygen, OSA, tobacco use disorder, type 2 diabetes melltius, CKD IIIb, CAD, PVD, hypertension, gout presented to IMTS clinic for rectal bleeding for two days duration. He notes dark red blood in the stools which was mostly in the toilet. He notes similar episode one year ago with bright red blood per rectum for which he had colonoscopy which was negative for acute source of bleed. Patient reports mild dyspnea and generalized weakness during this time period. Denies chest pain, no nausea/vomiting, abdominal pain, urinary symptoms, lightheadedness/dizziness. He does check his sugars at home, usually 130-150.  Over the last 3 weeks, has had more fatigue and with gout flare (big toe)  and has required more assistance with his ADLs. He was started on steroids for this and notes that his feet have started feeling better since starting the steroids.   Meds:  No outpatient medications have been marked as taking for the 11/21/20 encounter [facility] Encounter).   Allergies: Allergies as of 11/21/2020 - Review Complete 10/21/2020  Allergen Reaction Noted  . Ace inhibitors Other (See Comments) 10/03/2017  . Spironolactone Other (See Comments) 10/19/2014   Past Medical History:  Diagnosis Date  . Anemia 10/18/2014   Baseline about 12 and stable from  2010 to 2016. Colon 2009 in [place] (records cannot be obtained).  EGD Dr Benson Norway 2011 nl   . Aortic atherosclerosis ([place]) 03/28/2020   Incidental finding on imaging CT   . Atherosclerosis 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 ([place]) 12/28/2014   ABI Feb 2017 R 0.49; L 0.72 with diffuse dz ABI Aug 2017 R 0.49; L 0.69 ABI Jan 2018 R 0.61; L 0.73  ABI Feb 2019 R 0.55; L 0.71 Sees Dr Bridgett Larsson - recs ABI q 6 months  . 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 Grade 2. EF 50-55%; echo repeated 2019, severe hypertrophy with elevated filling pressures  . Chronic kidney disease, stage 3b ([place]) 03/09/2020   Dr. Justin Mend  Nephrologist, f/u Q 73M  . Coronary artery disease without angina pectoris 04/04/2018  . Degloving injury of finger 03/28/2020   10/21/19: copied from op note "1.  Open reduction percutaneous pinning left small finger proximal phalanx fracture 2.  Complex repair of laceration to left small finger 3 cm in length 3.  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 No significant callus formation is noted. Pins had been removed  . Diverticulosis 10/08/2014   Seen on CT. Reportedly on Colon in [place] in 2009. Freq bouts of diverticulitis.  . DM (diabetes mellitus), type 2 with renal complications (  [place]) 10/09/2014  . Former tobacco use 10/08/2014  . Gout   . 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 Stable from 2010 to 2016.  Negative SPEP and UPEP 2014 after ARF 2/2 continued ACE (lisinopril 20) use while vol contracted. Dr Justin Mend  . Obesity (BMI 30.0-34.9) 03/09/2020  . 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 OSA (obstructive sleep apnea) 10/08/2014   July 2016 : Severe OSA/hypopnea syndrome, AHI 128.4, O2 nadir of 84% RA. Failed CPAP on study. BiPA inspiratory pressure of 21 and expiratory  pressure of 17 CWP. Consider ENT evaluation for potentially correctable upper airway obstruction contributing to the need for unusually high pressures - ENT July 2015 did not feel any intervention surgically was indicated. He wore a large F&P Simplus fullfac  . Personal history of colonic polyps 03/28/2014   Dr. Benson Norway, 2 polyps removed 2015.  No polyps on f/u in 2020.  No further surveillance suggested per Dr. Benson Norway.  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  He is unable to breathe through his nose comfortably.  This is part of the reason why he does not wear his oxygen regularly.  Nasal passages are nearly completely obstructed erythematous smooth glistening surface.  He has been told by his eye doctor that part of the reason his e  . Resistant hypertension 10/09/2014   Poor control with 6 drug therapy. 2016 : Aldo 37 but ARR 23.5   . Severe pulmonary arterial systolic hypertension ([place]) 10/18/2014   Noted as severe ECHO 2016 and 2019. Likely 2/2 severe untreated OSA. Pt is not adherent to CPAP.  Marland Kitchen Thrombocytopenia ([place]) 10/09/2016   Nl liver and spleen on [place] 2019  . Ulcer aphthous oral 04/27/2020   Evaluated emergency room last week, saw with Magic mouthwash   Family History:  Family History  Problem Relation Age of Onset  . Heart failure Sister        Died of complications Nov 3818  . CAD Sister        CABG x3 while in her 54's   Social History:  Patient's son lives with him and helps with medications and some times ADL's as patient has been feeling more fatigued recently. History of tobacco use, alcohol use and intermittent illicit drug use (marijuana and cocaine) approximately 30 years ago. None current.   Review of Systems: A complete ROS was negative except as per HPI.   Physical Exam: T: 99.56F, HR: 79, BP: 92/47, RR: 18, SpO2 100% on 2L supp O2 General:  Sitting in chair comfortably, no acute distress HENT: Normocephalic, atraumatic.  CV: Regular rate, rhythm. Systolic murmur present. Pulm: Normal work of breathing on 2L supp O2. Clear to auscultation bilaterally. GI: Soft, non-tender, non-distended. Normoactive bowel sounds. Skin: Warm, dry. No rashes or lesions appreciated. MSK: No pitting edema bilaterally. Normal bulk, tone. Neuro: Awake, alert, answering questions appropriately. Psych: Normal mood, affect, speech.  CBC Latest Ref Rng & Units 11/21/2020 10/21/2019 01/20/2019  WBC 4.0 - 10.5 K/uL 12.3(H) 9.2 7.8  Hemoglobin 13.0 - 17.0 g/dL 10.1(L) 12.6(L) 12.3(L)  Hematocrit 39.0 - 52.0 % 30.0(L) 40.2 37.5(L)  Platelets 150 - 400 K/uL 138(L) 152 132(L)   BMP Latest Ref Rng & Units 11/21/2020 09/14/2020 06/29/2020  Glucose 70 - 99 mg/dL 628(HH) 111(H) 138(H)  BUN 8 - 23 mg/dL 52(H) 33(H) 43(H)  Creatinine 0.61 - 1.24 mg/dL 2.02(H) 2.20(H) 2.04(H)  BUN/Creat Ratio 10 - 24 - 15 21  Sodium 135 - 145 mmol/L 129(L) 145(H) 141  Potassium 3.5 - 5.1 mmol/L 4.5 4.1 4.1  Chloride 98 - 111 mmol/L 95(L) 107(H) 103  CO2 22 - 32 mmol/L _0 Calcium 8.9 - 10.3 mg/dL 8.6(L) 9.0 9.2   Assessment & Plan by Problem: Leanor Kail is 75yo cismale (he/him) with chronic diastolic heart failure, OSA, type 2 diabetes melltius, CKD IIIb, CAD, PVD, hypertension, gout admitted 5/17 with acute GI bleed and hyperglycemia.  Active Problems:   GI bleed  #Acute GI bleed Patient arrived to [facility]MTS Clinic this afternoon for evaluation of rectal bleeding. He was subsequently found to be hypotensive and given 1L IVF with appropriate response in BP. It was found on exam by clinic provider that he had red blood per rectum and IMTS was called for admission. Patient did undergo colonoscopy last year without any conclusive findings. Patient was recently started on steroids for gout flare and has had worsening fatigue and dyspnea over the last few days. He is not on  supplemental oxygen at home right now, but is currently requiring it. He does not have abdominal pain currently. Suspect this could be upper GI bleed, especially given he has had dark stools and BUN elevated at 52. Plan to contact GI for further recommendations. Will make NPO for now for possible scope tomorrow. - Appreciate GI recommendations - NPO - Protonix 47m BID  #Severe hyperglycemia #Type II diabetes mellitus Lab work obtained in clinic this afternoon revealed glucose 628, appears dry on exam. No acidosis or anion gap concerning for ketoacidosis. Last A1c 5.7% 3/22 controlled with weekly Trulicity. Most likely hyperglycemia 2/2 steroid use. Will start on insulin drip, fluids. - Insulin gtt - Start LR 125cc/hr, switch to D5-LR once CBG<250 - CBG monitoring per [employer]  #CKDIIIb Renal function appears to be close to baseline w/ GFR 34. BUN elevated, possibly signaling upper GI bleed. Will continue to monitor renal function. - Daily BMP - I/O - Avoid nephrotoxins  #Pseduohyponatremia Na 129, corrected to 137. Expect to normalized as we normalize sugars. - Daily BMP  #Gout Holding steroids now, as this could likely be cause of both acute GI bleed as well as severe hyperglycemia.  - Hold home steroids - Avoid NSAIDs in setting of GI bleed  #Chronic diastolic heart failure Appears hypovolemic 2/2 GI bleed and hyperglycemia. Will continue to monitor as we give him fluids.  - Hold home torsemide, eplerenone  #Hypertension  Blood pressures have been soft due to acute GI bleed. Will hold all antihypertensives at this time. - Hold home hydralazine, verapamil, labetalol  DIET: NPO pending GI eval IVF: LR, D5-LR DVT PPX: SCDs BOWEL: PPI CODE: FULL FAM COM: Patient's daughter ([name] requests update tomorrow.   Dispo: Admit patient to Inpatient with expected length of stay greater than 2 midnights.  Signed: BSanjuan Dame MD 11/21/2020, 3:34 PM  Pager:  [phone] 5pm on weekdays and 1pm on weekends: On Call pager: [phone]

## 2020-11-21 NOTE — Telephone Encounter (Signed)
Call from pt's son. Stated pt noticed blood in his stool which started yesterday; dark red blood. Pt stated he noticed in the commode (when going to the bathroom). Also stated the blood is less today. Pt's son was wondering if it's from the Prednisone which was started on 5/5. First available appt given today with Dr Court Joy @ 1415 PM. If not appropriate, let me know.

## 2020-11-21 NOTE — Telephone Encounter (Signed)
Appointment today is appropriate

## 2020-11-21 NOTE — Progress Notes (Signed)
Patient arrived to unit room [id number] from 6N. Off going RN at bedside for transfer. Patient stable and accompanied by daughter. Will proceed with care and notify physician for any acute changes.

## 2020-11-21 NOTE — Progress Notes (Signed)
CC: bright red blood per rectum   HPI:Mr.JAYMESON MENGEL is a 75 y.o. male who presents for evaluation of bright red blood per rectum. Patient was also found to be hyperglycemic and hypotensive.  Please see individual problem based A/P for details.   Past Medical History:  Diagnosis Date  . Anemia 10/18/2014   Baseline about 12 and stable from 2010 to 2016. Colon 2009 in [place] (records cannot be obtained).  EGD Dr Benson Norway 2011 nl   . Aortic atherosclerosis ([place]) 03/28/2020   Incidental finding on imaging CT   . Atherosclerosis 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 ([place]) 12/28/2014   ABI Feb 2017 R 0.49; L 0.72 with diffuse dz ABI Aug 2017 R 0.49; L 0.69 ABI Jan 2018 R 0.61; L 0.73  ABI Feb 2019 R 0.55; L 0.71 Sees Dr Bridgett Larsson - recs ABI q 6 months  . Chronic diastolic 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 ([place]) 10/18/2014   Noted ECHO 10/2014. Grade 2. EF 50-55%; echo repeated 2019, severe hypertrophy with elevated filling pressures  . Chronic kidney disease, stage 3b ([place]) 03/09/2020   Dr. Justin Mend  Nephrologist, f/u Q 74M  . Coronary artery disease without angina pectoris 04/04/2018  . Degloving injury of finger 03/28/2020   10/21/19: copied from op note "1.  Open reduction percutaneous pinning left small finger proximal phalanx fracture 2.  Complex repair of laceration to left small finger 3 cm in length 3.  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 No significant callus formation is noted. Pins had been removed  . Diverticulosis 10/08/2014   Seen on CT. Reportedly on Colon in [place] in 2009. Freq bouts of diverticulitis.  . DM (diabetes mellitus), type 2 with renal complications ([place]) 07/13/1094  . Former tobacco use 10/08/2014  . Gout   . 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 Stable from 2010 to 2016.  Negative SPEP  and UPEP 2014 after ARF 2/2 continued ACE (lisinopril 20) use while vol contracted. Dr Justin Mend  . Obesity (BMI 30.0-34.9) 03/09/2020  . 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 OSA (obstructive sleep apnea) 10/08/2014   July 2016 : Severe OSA/hypopnea syndrome, AHI 128.4, O2 nadir of 84% RA. Failed CPAP on study. BiPA inspiratory pressure of 21 and expiratory pressure of 17 CWP. Consider ENT evaluation for potentially correctable upper airway obstruction contributing to the need for unusually high pressures - ENT July 2015 did not feel any intervention surgically was indicated. He wore a large F&P Simplus fullfac  . Personal history of colonic polyps 03/28/2014   Dr. Benson Norway, 2 polyps removed 2015.  No polyps on f/u in 2020.  No further surveillance suggested per Dr. Benson Norway.  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  He is unable to breathe through his nose comfortably.  This is part of the reason why he does not wear his oxygen regularly.  Nasal passages are nearly completely obstructed erythematous smooth glistening surface.  He has been told by his eye doctor that part of the reason his e  . Resistant hypertension 10/09/2014   Poor control with 6 drug therapy. 2016 : Aldo 37 but ARR 23.5   . Severe pulmonary arterial systolic hypertension ([place]) 10/18/2014   Noted  as severe ECHO 2016 and 2019. Likely 2/2 severe untreated OSA. Pt is not adherent to CPAP.  Marland Kitchen Thrombocytopenia ([place]) 10/09/2016   Nl liver and spleen on [place] 2019  . Ulcer aphthous oral 04/27/2020   Evaluated emergency room last week, saw with Magic mouthwash   Review of Systems:   Review of Systems  Constitutional: Negative for chills and fever.  Gastrointestinal: Positive for blood in stool. Negative for abdominal pain, constipation, melena and nausea.  Endo/Heme/Allergies: Positive for polydipsia.       Polyuria      Physical Exam: Vitals:   11/21/20 1421 11/21/20 1609  BP: (!) 92/47 121/62  Pulse: 79 73  Temp: 99.1 F (37.3 C)   TempSrc: Oral   SpO2: 100%   Weight: 167 lb 14.4 oz (76.2 kg)     General: Chronically ill appearing elderly gentleman, in NAD , wearing nasal canula sitting in a wheelcharir HEENT: Conjunctiva pale , proptosis, antiicteric sclerae, dry mucous membranes, no exudate or erythema Cardiovascular: Normal rate, regular rhythm.  No murmurs, rubs, or gallops Pulmonary : Equal breath sounds, No wheezes, rales, or rhonchi Abdominal: soft, nontender,  bowel sounds present Rectal exam: No external hemorrhoid, small amount of bright red blood outside rectum, no palpable internal hemorrhoid, bright red blood present on glove after exam. No pain on palpation of prostate. Prostate is enlarged, but no irregularity/nodules.  Skin: Dry skin, decreased skin turgor in hands   Assessment & Plan:   See Encounters Tab for problem based charting.  Patient discussed with Dr. Angelia Mould

## 2020-11-21 NOTE — Progress Notes (Signed)
Patient transfered to 2w21. Patient stable. Daughter at bedside for transfer.

## 2020-11-22 DIAGNOSIS — N1832 Chronic kidney disease, stage 3b: Secondary | ICD-10-CM

## 2020-11-22 DIAGNOSIS — I5032 Chronic diastolic (congestive) heart failure: Secondary | ICD-10-CM

## 2020-11-22 DIAGNOSIS — E1165 Type 2 diabetes mellitus with hyperglycemia: Secondary | ICD-10-CM

## 2020-11-22 DIAGNOSIS — K922 Gastrointestinal hemorrhage, unspecified: Secondary | ICD-10-CM

## 2020-11-22 DIAGNOSIS — I10 Essential (primary) hypertension: Secondary | ICD-10-CM

## 2020-11-22 LAB — GLUCOSE, CAPILLARY
Glucose-Capillary: 130 mg/dL — ABNORMAL HIGH (ref 70–99)
Glucose-Capillary: 140 mg/dL — ABNORMAL HIGH (ref 70–99)
Glucose-Capillary: 174 mg/dL — ABNORMAL HIGH (ref 70–99)
Glucose-Capillary: 196 mg/dL — ABNORMAL HIGH (ref 70–99)
Glucose-Capillary: 202 mg/dL — ABNORMAL HIGH (ref 70–99)
Glucose-Capillary: 219 mg/dL — ABNORMAL HIGH (ref 70–99)
Glucose-Capillary: 292 mg/dL — ABNORMAL HIGH (ref 70–99)
Glucose-Capillary: 293 mg/dL — ABNORMAL HIGH (ref 70–99)
Glucose-Capillary: 371 mg/dL — ABNORMAL HIGH (ref 70–99)
Glucose-Capillary: 60 mg/dL — ABNORMAL LOW (ref 70–99)
Glucose-Capillary: 92 mg/dL (ref 70–99)

## 2020-11-22 LAB — CBC
HCT: 28.9 % — ABNORMAL LOW (ref 39.0–52.0)
Hemoglobin: 9.5 g/dL — ABNORMAL LOW (ref 13.0–17.0)
MCH: 30.4 pg (ref 26.0–34.0)
MCHC: 32.9 g/dL (ref 30.0–36.0)
MCV: 92.6 fL (ref 80.0–100.0)
Platelets: 130 10*3/uL — ABNORMAL LOW (ref 150–400)
RBC: 3.12 MIL/uL — ABNORMAL LOW (ref 4.22–5.81)
RDW: 13.4 % (ref 11.5–15.5)
WBC: 11.3 10*3/uL — ABNORMAL HIGH (ref 4.0–10.5)
nRBC: 0 % (ref 0.0–0.2)

## 2020-11-22 LAB — COMPREHENSIVE METABOLIC PANEL
ALT: 17 U/L (ref 0–44)
AST: 11 U/L — ABNORMAL LOW (ref 15–41)
Albumin: 3.2 g/dL — ABNORMAL LOW (ref 3.5–5.0)
Alkaline Phosphatase: 79 U/L (ref 38–126)
Anion gap: 9 (ref 5–15)
BUN: 42 mg/dL — ABNORMAL HIGH (ref 8–23)
CO2: 24 mmol/L (ref 22–32)
Calcium: 9.2 mg/dL (ref 8.9–10.3)
Chloride: 103 mmol/L (ref 98–111)
Creatinine, Ser: 1.63 mg/dL — ABNORMAL HIGH (ref 0.61–1.24)
GFR, Estimated: 43 mL/min — ABNORMAL LOW (ref >60)
Glucose, Bld: 128 mg/dL — ABNORMAL HIGH (ref 70–99)
Potassium: 3.7 mmol/L (ref 3.5–5.1)
Sodium: 136 mmol/L (ref 135–145)
Total Bilirubin: 0.5 mg/dL (ref 0.3–1.2)
Total Protein: 6.1 g/dL — ABNORMAL LOW (ref 6.5–8.1)

## 2020-11-22 LAB — APTT: aPTT: 26 seconds (ref 24–36)

## 2020-11-22 LAB — PROTIME-INR
INR: 1.2 (ref 0.8–1.2)
Prothrombin Time: 15.4 seconds — ABNORMAL HIGH (ref 11.4–15.2)

## 2020-11-22 MED ORDER — HYDRALAZINE HCL 50 MG PO TABS
100.0000 mg | ORAL_TABLET | Freq: Three times a day (TID) | ORAL | Status: DC
  Administered 2020-11-22 – 2020-11-23 (×5): 100 mg via ORAL
  Filled 2020-11-22 (×5): qty 2

## 2020-11-22 MED ORDER — LABETALOL HCL 200 MG PO TABS
400.0000 mg | ORAL_TABLET | Freq: Two times a day (BID) | ORAL | Status: DC
  Administered 2020-11-22 (×2): 400 mg via ORAL
  Filled 2020-11-22 (×2): qty 2

## 2020-11-22 MED ORDER — LACTATED RINGERS IV SOLN: INTRAVENOUS | Status: DC

## 2020-11-22 MED ORDER — INSULIN ASPART 100 UNIT/ML IJ SOLN
0.0000 [IU] | INTRAMUSCULAR | Status: DC
  Administered 2020-11-22: 3 [IU] via SUBCUTANEOUS
  Administered 2020-11-22: 2 [IU] via SUBCUTANEOUS
  Administered 2020-11-22: 5 [IU] via SUBCUTANEOUS
  Administered 2020-11-22: 3 [IU] via SUBCUTANEOUS
  Administered 2020-11-22: 5 [IU] via SUBCUTANEOUS
  Administered 2020-11-23: 2 [IU] via SUBCUTANEOUS

## 2020-11-22 MED ORDER — PANTOPRAZOLE SODIUM 40 MG PO TBEC
40.0000 mg | DELAYED_RELEASE_TABLET | Freq: Two times a day (BID) | ORAL | Status: DC
  Administered 2020-11-22 – 2020-11-23 (×3): 40 mg via ORAL
  Filled 2020-11-22 (×3): qty 1

## 2020-11-22 MED ORDER — DEXTROSE 50 % IV SOLN
INTRAVENOUS | Status: AC
  Filled 2020-11-22: qty 50

## 2020-11-22 MED ORDER — VERAPAMIL HCL 120 MG PO TABS
120.0000 mg | ORAL_TABLET | Freq: Two times a day (BID) | ORAL | Status: DC
  Administered 2020-11-22 – 2020-11-23 (×3): 120 mg via ORAL
  Filled 2020-11-22 (×4): qty 1

## 2020-11-22 NOTE — Progress Notes (Signed)
Patient has been on insulin gtt as ordered since midnight. Blood glucose started off in the upper 500's, after gtt initiation, glucose started to decrease quickly as noted in chart. Gtt paused when glucose reading was 92, recheck 15 minutes later read 60. Physician notified, dextrose given to treat hypoglycemia. Patient experienced no signs of distress from the acute change.

## 2020-11-22 NOTE — Progress Notes (Signed)
Occupational Therapy Treatment Patient Details Name: Dylan [name] MRN: [id number] DOB: [birth year] Today's Date: 11/22/2020    History of present illness Pt is 75y/o m admitted 11/21/20 with rectal bleeding for two days duration, mild dyspnea and generalized weakness, gout flare (big toe) dx hyperglycemic and hypotensive. PMH includes chronic diastolic heart failure, chronic respiratory failure on 2L supplemental oxygen, OSA, tobacco use disorder, type 2 diabetes melltius, CKD IIIb, CAD, PVD, hypertension, gout.   OT comments  Pt is typically independent in ADL and mobility - gets assist from son when his gout flares up and he cannot WB through BLE. Today he was set up for seated grooming, min A for bed mobility, min A for transfers with RW, on RA throughout with SpO2 >90% throughout session. Pt presents with overall deficits in balance, strength, activity tolerance impacting ADL performance. Pt will benefit from skilled OT in the acute setting and afterwards at the [facility] level (Pt also interested in an Aide) to maximize safety and independence in ADL and functional transfers. Next session focus on energy conservation and standing grooming tasks.   Follow Up Recommendations  Home health OT    Equipment Recommendations  3 in 1 bedside commode    Recommendations for Other Services      Precautions / Restrictions Precautions Precautions: Fall       Mobility Bed Mobility Overal bed mobility: Needs Assistance Bed Mobility: Supine to Sit     Supine to sit: Min assist     General bed mobility comments: min A for trunk elevation, cues to scoot hips EOB to get BLE on floor    Transfers Overall transfer level: Needs assistance Equipment used: Rolling walker (2 wheeled) Transfers: Sit to/from Stand Sit to Stand: Min assist         General transfer comment: vc for safe hand placement with RW, rocking momentum used, min A for boost    Balance Overall balance assessment: Needs  assistance Sitting-balance support: Single extremity supported;Feet supported Sitting balance-Leahy Scale: Fair     Standing balance support: Bilateral upper extremity supported Standing balance-Leahy Scale: Poor Standing balance comment: dependent on BUE support                           ADL either performed or assessed with clinical judgement   ADL Overall ADL's : Needs assistance/impaired Eating/Feeding: Set up;Sitting   Grooming: Set up;Sitting;Wash/dry hands;Wash/dry face Grooming Details (indicate cue type and reason): in recliner Upper Body Bathing: Minimal assistance;Sitting Upper Body Bathing Details (indicate cue type and reason): for back Lower Body Bathing: Min guard;Sitting/lateral leans   Upper Body Dressing : Set up;Sitting   Lower Body Dressing: Minimal assistance;Sit to/from stand Lower Body Dressing Details (indicate cue type and reason): unable to perform figure 4 at this time Toilet Transfer: Minimal assistance;Ambulation;RW Toilet Transfer Details (indicate cue type and reason): min A for balance and line management Toileting- Clothing Manipulation and Hygiene: Minimal assistance;Sit to/from stand       Functional mobility during ADLs: Minimal assistance;Rolling walker (vc for safe hand placement) General ADL Comments: decreased activity tolerance, decreased balance     Vision Baseline Vision/History: Wears glasses Wears Glasses: Reading only Patient Visual Report: No change from baseline     Perception     Praxis      Cognition Arousal/Alertness: Awake/alert Behavior During Therapy: WFL for tasks assessed/performed Overall Cognitive Status: Within Functional Limits for tasks assessed  Exercises     Shoulder Instructions       General Comments Pt on RA Throughout session with SpO2 >90% throughout including walking    Pertinent Vitals/ Pain       Pain Assessment:  No/denies pain  Home Living Family/patient expects to be discharged to:: Private residence Living Arrangements: Children (adult son) Available Help at Discharge: Family;Available PRN/intermittently (son works at [employer]) Type of Home: Apartment Home Access: Stairs to enter Technical brewer of Steps: 4 Entrance Stairs-Rails: Right Home Layout: One level     Bathroom Shower/Tub: Teacher, early years/pre: [place] - single point;Walker - 4 wheels          Prior Functioning/Environment Level of Independence: Independent        Comments: only gets help from son during gout flare ups   Frequency  Min 2X/week        Progress Toward Goals  OT Goals(current goals can now be found in the care plan section)     Acute Rehab OT Goals Patient Stated Goal: get back to independent at home, interested in [facility] OT Goal Formulation: With patient Time For Goal Achievement: 12/06/20 Potential to Achieve Goals: Good ADL Goals Pt Will Perform Grooming: with modified independence;standing Pt Will Perform Upper Body Dressing: with modified independence;sitting Pt Will Perform Lower Body Dressing: with supervision;sit to/from stand Pt Will Transfer to Toilet: with modified independence;ambulating Pt Will Perform Toileting - Clothing Manipulation and hygiene: with modified independence;sit to/from stand Additional ADL Goal #1: Pt will verbalize 3 ways of conserving energy during ADL routine with no cues  Plan      Co-evaluation                 AM-PAC OT "6 Clicks" Daily Activity     Outcome Measure   Help from another person eating meals?: A Little Help from another person taking care of personal grooming?: A Little Help from another person toileting, which includes using toliet, bedpan, or urinal?: A Little Help from another person bathing (including washing, rinsing, drying)?: A Little Help from another person to put on and taking  off regular upper body clothing?: A Little Help from another person to put on and taking off regular lower body clothing?: A Lot 6 Click Score: 17    End of Session Equipment Utilized During Treatment: Gait belt;Rolling walker  OT Visit Diagnosis: Unsteadiness on feet (R26.81);Other abnormalities of gait and mobility (R26.89);Muscle weakness (generalized) (M62.81)   Activity Tolerance Patient tolerated treatment well   Patient Left in chair;with call bell/phone within reach (no chair alarm)   Nurse Communication Mobility status;Other (comment) (no chair alarm)        Time: 1255-1318 OT Time Calculation (min): 23 min  Charges: OT General Charges $OT Visit: 1 Visit OT Evaluation $OT Eval Moderate Complexity: [place] OTR/L Acute Rehabilitation Services Pager: [phone] Office: [place] 11/22/2020, 1:28 PM

## 2020-11-22 NOTE — Progress Notes (Signed)
Subjective:  Dylan [name]  Mr. Landgrebe states that he wasn't able to sleep overnight because he was too cold. He says he's unaware of any more bleeding per rectum and denies any symptoms at this time including abdominal pain, N/V, CP, SOB or any other symptoms. Mentions troubles with his left eye. 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 He endorses trauma to this eye in the past. Denies any neuropathy.   Objective:  Vital signs in last 24 hours: Vitals:   11/21/20 1655 11/21/20 2117 11/21/20 2346 11/22/20 0337  BP: (!) 109/59 125/62 (!) 128/57 (!) 135/55  Pulse: 73 72 70 73  Resp:  _0 Temp: 98.6 F (37 C) 98 F (36.7 C) 98 F (36.7 C) 97.9 F (36.6 C)  TempSrc: Oral     SpO2: 93% 97% 97% 97%  Weight: 76.1 kg     Height: 5' 6" (1.676 m)      Physical Exam: General: Laying comfortably in bed, no acute distress CV: Regular rate, rhythm. Systolic murmur appreciated. Pulm: Normal work of breathing on room air. Clear to auscultation bilaterally. Abdomen: Soft, non-tender, non-distended. Normoactive bowel sounds. MSK: No pitting edema bilaterally. Normal bulk, tone.  CBC Latest Ref Rng & Units 11/22/2020 11/21/2020 10/21/2019  WBC 4.0 - 10.5 K/uL 11.3(H) 12.3(H) 9.2  Hemoglobin 13.0 - 17.0 g/dL 9.5(L) 10.1(L) 12.6(L)  Hematocrit 39.0 - 52.0 % 28.9(L) 30.0(L) 40.2  Platelets 150 - 400 K/uL 130(L) 138(L) 152   BMP Latest Ref Rng & Units 11/22/2020 11/21/2020 09/14/2020  Glucose 70 - 99 mg/dL 128(H) 628(HH) 111(H)  BUN 8 - 23 mg/dL 42(H) 52(H) 33(H)  Creatinine 0.61 - 1.24 mg/dL 1.63(H) 2.02(H) 2.20(H)  BUN/Creat Ratio 10 - 24 - - 15  Sodium 135 - 145 mmol/L 136 129(L) 145(H)  Potassium 3.5 - 5.1 mmol/L 3.7 4.5 4.1  Chloride 98 - 111 mmol/L 103 95(L) 107(H)  CO2 22 - 32 mmol/L _1 Calcium 8.9 - 10.3 mg/dL 9.2 8.6(L) 9.0   Assessment/Plan: Dylan [name] is 75yo person living with chronic diastolic heart failure, OSA, type II  diabetes mellitus, CKD IIIb, CAD, PVD, hypertension, gout admitted 5/17 with acute GI bleed and hyperglycemia, stable with expected discharge in the next 1-2 days.  Active Problems:   GI bleed  #Acute GI bleed No further episodes of bleeding per patient. Continues to be afebrile and hemodynamically stable. Hgb 9.5<10.1 this AM. Discussed with GI this AM, rectal bleeding thought to be 2/2 diverticular bleed. At this point will not need further invasive procedures. Will plan to advance diet today, continue with PPI BID. If no acute changes will plan to discharge tomorrow with plans to follow-up with GI in outpatient setting. - Advance to full liquid diet today - Pantoprazole 75m BID - Daily CBC - Transfuse Hgb <7 - F/u GI in clinic - PT/OT  #Severe hyperglycemia, resolved #Type II diabetes mellitus Last evening patient was placed on insulin drip. Was able to be weaned off early this morning. Glucose now ~130. Expect sugars to normalize now that patient is off the steroids. Will continue with sliding scale and continue to monitor. - CBG monitoring q4h - SSI  #CKDIIIB sCr 1.6<2.0 w/ improvement of GFR with IVF given. Patient's blood pressure began to rise this morning, will hold off further fluids, especially as patient will be able to advance diet. - Daily BMP - I/O - Avoid nephrotoxins  #  Chronic diastolic heart failure #Hypertension This morning on exam appears euvolemic. Will re-start home blood pressure medications and stop IVF. - D/c IVF - Start home verapamil, labetalol, hydralazine  #Gamma gap A/G<1 on CMP. Corrected calcium 9.8. No clear cause, will follow-up with SPEP, UPEP. - F/u SPEP, UPEP  DIET: Full liquid IVF: n/a DVT PPX: SCD BOWEL: n/a CODE: FULL FAM COM: Plan to call daughter or son later today  Prior to Admission Living Arrangement: Home Anticipated Discharge Location: pending PT/OT Barriers to Discharge: medical management Dispo: Anticipated discharge in  approximately 1 day(s).   Dylan Dame, MD 11/22/2020, 6:21 AM Pager: [phone] After 5pm on weekdays and 1pm on weekends: On Call pager [phone]

## 2020-11-22 NOTE — Plan of Care (Signed)

## 2020-11-22 NOTE — Addendum Note (Signed)
Addended by: Truddie Crumble on: 11/22/2020 08:46 AM   Modules accepted: Orders

## 2020-11-22 NOTE — Evaluation (Signed)
Physical Therapy Evaluation Patient Details Name: Dylan [name] MRN: [id number] DOB: [birth year] Today's Date: 11/22/2020   History of Present Illness  75y/o male admitted 11/21/20 with rectal bleeding for two days duration, mild dyspnea and generalized weakness, gout flare (big toe) dx hyperglycemic and hypotensive. PMH includes chronic diastolic heart failure, chronic respiratory failure on 2L supplemental oxygen, OSA, tobacco use disorder, type 2 diabetes melltius, CKD IIIb, CAD, PVD, hypertension, gout.  Clinical Impression  Pt was seen for mobility on RW with assistance needed to balance and help establish limits of endurance.  Pt is motivated and feeling better enough to walk but had close guard of chair to avoid overdoing.  Pt is expecting to gt home with son, but asking about home health aide for support.  Follow acutely for strengthening needs and to progress him toward independence given the discharge plan.  Pt is up in chair with alarm in place, positioned and has call light.  Will instruct pt on safe limits of mobility during therapy sessions.    Follow Up Recommendations Home health PT;Supervision for mobility/OOB;Supervision/Assistance - 24 hour    Equipment Recommendations  Rolling walker with 5" wheels    Recommendations for Other Services       Precautions / Restrictions Precautions Precautions: Fall      Mobility  Bed Mobility Overal bed mobility: Needs Assistance Bed Mobility: Supine to Sit     Supine to sit: Min assist     General bed mobility comments: min to scoot to EOB and to support trunk    Transfers Overall transfer level: Needs assistance Equipment used: Rolling walker (2 wheeled) Transfers: Sit to/from Stand Sit to Stand: Min assist         General transfer comment: count to stand and min assist with support to power up, cued hand placement  Ambulation/Gait Ambulation/Gait assistance: Min assist Gait Distance (Feet): 180 Feet Assistive  device: Rolling walker (2 wheeled);1 person hand held assist (second person with chair) Gait Pattern/deviations: Step-to pattern;Step-through pattern;Wide base of support;Decreased stride length Gait velocity: reduced Gait velocity interpretation: <1.31 ft/sec, indicative of household ambulator General Gait Details: mildly halting steps with wwalk on the hallway, followed with a chair to avoid his possibility of fatiguing  Stairs            Wheelchair Mobility    Modified Rankin (Stroke Patients Only)       Balance Overall balance assessment: Needs assistance Sitting-balance support: Feet supported Sitting balance-Leahy Scale: Fair     Standing balance support: Bilateral upper extremity supported;During functional activity Standing balance-Leahy Scale: Poor                               Pertinent Vitals/Pain Pain Assessment: No/denies pain    Home Living Family/patient expects to be discharged to:: Private residence Living Arrangements: Children Available Help at Discharge: Family;Available PRN/intermittently Type of Home: Apartment Home Access: Stairs to enter Entrance Stairs-Rails: Right Entrance Stairs-Number of Steps: 4 Home Layout: One level Home Equipment: Cane - single point;Walker - 4 wheels Additional Comments: has full care from his son    Prior Function Level of Independence: Independent         Comments: only gets help from son during gout flare ups     Hand Dominance   Dominant Hand: Right    Extremity/Trunk Assessment   Upper Extremity Assessment Upper Extremity Assessment: Defer to OT evaluation    Lower Extremity Assessment Lower  Extremity Assessment: Generalized weakness    Cervical / Trunk Assessment Cervical / Trunk Assessment: Kyphotic  Communication   Communication: HOH  Cognition Arousal/Alertness: Awake/alert Behavior During Therapy: WFL for tasks assessed/performed Overall Cognitive Status: Within  Functional Limits for tasks assessed                                        General Comments General comments (skin integrity, edema, etc.): monitored vitals on telemetry on hallway with IV pole in tow    Exercises     Assessment/Plan    PT Assessment Patient needs continued PT services  PT Problem List Decreased strength;Decreased activity tolerance;Decreased balance;Decreased mobility;Decreased coordination (maintained O2 sats on rooma ir)       PT Treatment Interventions DME instruction;Gait training;Stair training;Functional mobility training;Therapeutic activities;Therapeutic exercise;Balance training;Neuromuscular re-education;Patient/family education    PT Goals (Current goals can be found in the Care Plan section)  Acute Rehab PT Goals Patient Stated Goal: wants to get [facility] aide PT Goal Formulation: With patient Time For Goal Achievement: 11/29/20 Potential to Achieve Goals: Good    Frequency Min 3X/week   Barriers to discharge Inaccessible home environment;Decreased caregiver support      Co-evaluation               AM-PAC PT "6 Clicks" Mobility  Outcome Measure Help needed turning from your back to your side while in a flat bed without using bedrails?: A Little Help needed moving from lying on your back to sitting on the side of a flat bed without using bedrails?: A Little Help needed moving to and from a bed to a chair (including a wheelchair)?: A Little Help needed standing up from a chair using your arms (e.g., wheelchair or bedside chair)?: A Little Help needed to walk in hospital room?: A Little Help needed climbing 3-5 steps with a railing? : Total 6 Click Score: 16    End of Session Equipment Utilized During Treatment: Gait belt Activity Tolerance: Patient tolerated treatment well;Patient limited by fatigue Patient left: in chair;with call bell/phone within reach;with chair alarm set Nurse Communication: Mobility status PT Visit  Diagnosis: Unsteadiness on feet (R26.81);Muscle weakness (generalized) (M62.81);Difficulty in walking, not elsewhere classified (R26.2)    Time: 5306-3167 PT Time Calculation (min) (ACUTE ONLY): 23 min   Charges:   PT Evaluation $PT Eval Moderate Complexity: 1 Mod         Ramond Dial 11/22/2020, 6:17 PM  Mee Hives, PT MS Acute Rehab Dept. Number: [place] and [place]

## 2020-11-22 NOTE — TOC Initial Note (Addendum)
Transition of Care [facility]) - Initial/Assessment Note    Patient Details  Name: Dylan [name] MRN: [id number] Date of Birth: 06/01/1946  Transition of Care [facility]) CM/SW Contact:    Angelita Ingles, RN Phone Number: 11/22/2020, 2:43 PM  Clinical Narrative:                 [facility] consulted for HH/ DME needs.Patient reports that he is from home with his son and will return home with son. CM at bedside to offer patient choice. HH has been set up with [facility]. DME rolling walker and 3 in 1 has been arranged and set up with [employer] to deliver to the room.  TOC will continue to follow.  Expected Discharge Plan: [place] Barriers to Discharge: Continued Medical Work up   Patient Goals and CMS Choice Patient states their goals for this hospitalization and ongoing recovery are:: Wants to get better CMS Medicare.gov Compare Post Acute Care list provided to:: Patient Choice offered to / list presented to : Patient  Expected Discharge Plan and Services Expected Discharge Plan: [place] In-house Referral: NA Discharge Planning Services: CM Consult Post Acute Care Choice: [place] arrangements for the past 2 months: Apartment                 DME Arranged: [place]ford Rile rolling,3-N-1 DME Agency: [employer] Date DME Agency Contacted: 11/22/20 Time DME Agency Contacted: [phone] Representative spoke with at DME Agency: [place]: PT,OT [place] Agency: [facility] [place] Date [place]: 11/22/20 Time [place]: [place] Representative spoke with at [place]: [place] Arrangements/Services Living arrangements for the past 2 months: [place] with:: Adult Children Patient language and need for interpreter reviewed:: Yes Do you feel safe going back to the place where you live?: Yes      Need for Family Participation in Patient Care: Yes (Comment) Care giver support system in place?: Yes (comment)   Criminal Activity/Legal  Involvement Pertinent to Current Situation/Hospitalization: No - Comment as needed  Activities of Daily Living      Permission Sought/Granted   Permission granted to share information with : No              Emotional Assessment Appearance:: Appears stated age Attitude/Demeanor/Rapport: Gracious Affect (typically observed): Pleasant Orientation: : Oriented to Self,Oriented to Situation,Oriented to  Time,Oriented to Place Alcohol / Substance Use: Not Applicable Psych Involvement: No (comment)  Admission diagnosis:  GI bleed [K92.2] Patient Active Problem List   Diagnosis Date Noted  . GI bleed 11/21/2020  . Type 2 diabetes mellitus with hyperglycemia ([place]) 11/21/2020  . History of BPH 09/14/2020  . Exertional dyspnea 09/14/2020  . Healthcare maintenance 09/13/2020  . Urge urinary incontinence 06/29/2020  . Refractory obstruction of nasal airway 04/27/2020  . Aortic atherosclerosis ([place]) 03/28/2020  . Obesity (BMI 30.0-34.9) 03/09/2020  . Stage 3b chronic kidney disease ([place]) 03/09/2020  . Coronary artery disease without angina pectoris 04/04/2018  . 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 Atherosclerosis 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 ([place]) 12/28/2014  . Gout 10/19/2014  . 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 Severe pulmonary arterial systolic hypertension ([place]) 10/18/2014  . 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 Resistant hypertension 10/09/2014  . OSA (obstructive sleep apnea) 10/08/2014  . Former tobacco use 10/08/2014  . Personal history of colonic polyps 03/28/2014  . DM (diabetes  mellitus), type 2 with renal complications ([place]) 41/96/2229   PCP:  Angelica Pou, MD Pharmacy:   [place], [place] [address] [place] [address] Phone: [phone] Fax: [phone]     Social  Determinants of Health (SDOH) Interventions    Readmission Risk Interventions No flowsheet data found.

## 2020-11-23 ENCOUNTER — Telehealth: Payer: Self-pay | Admitting: *Deleted

## 2020-11-23 DIAGNOSIS — I440 Atrioventricular block, first degree: Secondary | ICD-10-CM

## 2020-11-23 DIAGNOSIS — R001 Bradycardia, unspecified: Secondary | ICD-10-CM

## 2020-11-23 DIAGNOSIS — I4510 Unspecified right bundle-branch block: Secondary | ICD-10-CM

## 2020-11-23 LAB — COMPREHENSIVE METABOLIC PANEL
ALT: 14 U/L (ref 0–44)
AST: 11 U/L — ABNORMAL LOW (ref 15–41)
Albumin: 2.9 g/dL — ABNORMAL LOW (ref 3.5–5.0)
Alkaline Phosphatase: 49 U/L (ref 38–126)
Anion gap: 5 (ref 5–15)
BUN: 25 mg/dL — ABNORMAL HIGH (ref 8–23)
CO2: 26 mmol/L (ref 22–32)
Calcium: 9.1 mg/dL (ref 8.9–10.3)
Chloride: 105 mmol/L (ref 98–111)
Creatinine, Ser: 1.35 mg/dL — ABNORMAL HIGH (ref 0.61–1.24)
GFR, Estimated: 54 mL/min — ABNORMAL LOW (ref >60)
Glucose, Bld: 107 mg/dL — ABNORMAL HIGH (ref 70–99)
Potassium: 3.6 mmol/L (ref 3.5–5.1)
Sodium: 136 mmol/L (ref 135–145)
Total Bilirubin: 0.6 mg/dL (ref 0.3–1.2)
Total Protein: 5.7 g/dL — ABNORMAL LOW (ref 6.5–8.1)

## 2020-11-23 LAB — CBC
HCT: 30 % — ABNORMAL LOW (ref 39.0–52.0)
Hemoglobin: 10.2 g/dL — ABNORMAL LOW (ref 13.0–17.0)
MCH: 31 pg (ref 26.0–34.0)
MCHC: 34 g/dL (ref 30.0–36.0)
MCV: 91.2 fL (ref 80.0–100.0)
Platelets: 136 10*3/uL — ABNORMAL LOW (ref 150–400)
RBC: 3.29 MIL/uL — ABNORMAL LOW (ref 4.22–5.81)
RDW: 13.6 % (ref 11.5–15.5)
WBC: 10.3 10*3/uL (ref 4.0–10.5)
nRBC: 0 % (ref 0.0–0.2)

## 2020-11-23 LAB — GLUCOSE, CAPILLARY
Glucose-Capillary: 84 mg/dL (ref 70–99)
Glucose-Capillary: 104 mg/dL — ABNORMAL HIGH (ref 70–99)
Glucose-Capillary: 194 mg/dL — ABNORMAL HIGH (ref 70–99)
Glucose-Capillary: 162 mg/dL — ABNORMAL HIGH (ref 70–99)

## 2020-11-23 LAB — TROPONIN I (HIGH SENSITIVITY)
Troponin I (High Sensitivity): 32 ng/L — ABNORMAL HIGH (ref <18)
Troponin I (High Sensitivity): 35 ng/L — ABNORMAL HIGH (ref <18)

## 2020-11-23 MED ORDER — PANTOPRAZOLE SODIUM 40 MG PO TBEC: DELAYED_RELEASE_TABLET | ORAL | 0 refills | Status: DC

## 2020-11-23 NOTE — Progress Notes (Signed)
AVS paperwork provided and reviewed with pt. All questioned answered. Pt discharged home with son.

## 2020-11-23 NOTE — Plan of Care (Signed)

## 2020-11-23 NOTE — Progress Notes (Addendum)
Reviewed EKG taken earlier this morning at the request of Dr. Darrick Meigs. EKG revealed sinus bradycardia (rate 48) with first degree AV delay. Patient was apparently asymptomatic at the time of the EKG recording. 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 Given that this is happening during sleep, I suspect this is related to increased vagotonia and this is likely worsened by his untreated obstructive sleep apnea. Recommend continued monitoring on telemetry overnight and encouraged use of his CPAP. If evidence of higher levels of block tonight or if this continues during working hours and/or patient becomes symptomatic, then would place formal cardiology consult .

## 2020-11-23 NOTE — Telephone Encounter (Signed)
TOC HFU PER DR BRASWELL FOR 11/28/2020 @ 8:45 AM WITH DR Allyson Sabal

## 2020-11-23 NOTE — Progress Notes (Signed)
Pt hr has dropping down to the 30s provider Christian,MD was notified and present at the bedside. A EKG was order and completed please see provider recommendation or notes for further information.

## 2020-11-23 NOTE — Progress Notes (Signed)
Physical Therapy Treatment Patient Details Name: Dylan [name] MRN: [id number] DOB: 12/19/1945 Today's Date: 11/23/2020    History of Present Illness 75y/o male admitted 11/21/20 with rectal bleeding for two days duration, mild dyspnea and generalized weakness, gout flare (big toe) dx hyperglycemic and hypotensive. PMH includes chronic diastolic heart failure, chronic respiratory failure on 2L supplemental oxygen, OSA, tobacco use disorder, type 2 diabetes melltius, CKD IIIb, CAD, PVD, hypertension, gout.    PT Comments    Patient moving more independently this date. Continues to require RW and RW education, however was able to walk with supervision/cues only.    Follow Up Recommendations  Home health PT;Supervision for mobility/OOB;Supervision/Assistance - 24 hour     Equipment Recommendations  Rolling walker with 5" wheels    Recommendations for Other Services       Precautions / Restrictions Precautions Precautions: Fall Restrictions Weight Bearing Restrictions: No    Mobility  Bed Mobility Overal bed mobility: Needs Assistance Bed Mobility: Supine to Sit     Supine to sit: Modified independent (Device/Increase time)     General bed mobility comments: no physical assist needed    Transfers Overall transfer level: Needs assistance Equipment used: Rolling walker (2 wheeled) Transfers: Sit to/from Stand Sit to Stand: Supervision;Modified independent (Device/Increase time)         General transfer comment: repeated x 6 progressing to modified independent (using UEs on thighs to assist)  Ambulation/Gait Ambulation/Gait assistance: Supervision Gait Distance (Feet): 160 Feet Assistive device: Rolling walker (2 wheeled);1 person hand held assist (second person with chair) Gait Pattern/deviations: Wide base of support;Decreased stride length;Step-through pattern Gait velocity: reduced   General Gait Details: vc for proximity to RW and for upright posture--pt  would  maintain for 15-20 feet and then need cues again   Stairs             Wheelchair Mobility    Modified Rankin (Stroke Patients Only)       Balance Overall balance assessment: Needs assistance Sitting-balance support: Feet supported Sitting balance-Leahy Scale: Fair     Standing balance support: During functional activity;No upper extremity supported Standing balance-Leahy Scale: Fair                              Cognition Arousal/Alertness: Awake/alert Behavior During Therapy: WFL for tasks assessed/performed Overall Cognitive Status: Within Functional Limits for tasks assessed                                        Exercises Other Exercises Other Exercises: sit to stand x 5 reps with hands on knees ascend & descend    General Comments        Pertinent Vitals/Pain Pain Assessment: No/denies pain    Home Living                      Prior Function            PT Goals (current goals can now be found in the care plan section) Acute Rehab PT Goals Patient Stated Goal: wants to get [facility] aide Time For Goal Achievement: 11/29/20 Potential to Achieve Goals: Good Progress towards PT goals: Progressing toward goals    Frequency    Min 3X/week      PT Plan Current plan remains appropriate    Co-evaluation  AM-PAC PT "6 Clicks" Mobility   Outcome Measure  Help needed turning from your back to your side while in a flat bed without using bedrails?: None Help needed moving from lying on your back to sitting on the side of a flat bed without using bedrails?: None Help needed moving to and from a bed to a chair (including a wheelchair)?: A Little Help needed standing up from a chair using your arms (e.g., wheelchair or bedside chair)?: A Little Help needed to walk in hospital room?: A Little Help needed climbing 3-5 steps with a railing? : A Little 6 Click Score: 20    End of Session Equipment  Utilized During Treatment: Gait belt Activity Tolerance: Patient tolerated treatment well;Patient limited by fatigue Patient left: in chair;with call bell/phone within reach Nurse Communication: Mobility status PT Visit Diagnosis: Unsteadiness on feet (R26.81);Muscle weakness (generalized) (M62.81);Difficulty in walking, not elsewhere classified (R26.2)     Time: 0158-6825 PT Time Calculation (min) (ACUTE ONLY): 14 min  Charges:  $Gait Training: 8-22 mins                      Arby Barrette, PT Pager [phone]    Rexanne Mano 11/23/2020, 12:27 PM

## 2020-11-23 NOTE — Progress Notes (Signed)
Subjective:   Overnight, episode of bradycardia induced by apneic episode in setting of untreated OSA. Patient asymptomatic.  This morning, patient reports feeling well. He reports some ongoing bleeding but not as much. He is asymptomatic at this time. No abdominal pain, no nausea. Does note one episode of emesis after eating grits and drinking half cup of coffee this morning when he went to use the bathroom. Denies any lightheadedness/dizziness. Discussed discharge with outpatient follow up.   Objective:  Vital signs in last 24 hours: Vitals:   11/22/20 2000 11/22/20 2239 11/22/20 2346 11/23/20 0330  BP: 138/83 (!) 165/74 (!) 103/46 (!) 151/51  Pulse: 68  64 85  Resp: _0 Temp: 98.2 F (36.8 C)  98.3 F (36.8 C) 98.6 F (37 C)  TempSrc: Oral  Oral Oral  SpO2: 93%  95% 94%  Weight:      Height:       Physical Exam: General: Laying in bed, no acute distress CV: Regular rate, rhythm. No murmurs, rubs, gallops appreciated Pulm: Normal work of breathing, clear to auscultation bilaterally. Abdomen: Soft, non-tender, non-distended. Normoactive bowel sounds. MSK: No pitting edema bilaterally.  Neuro: Awake, alert, answering questions appropriately.   CBC Latest Ref Rng & Units 11/23/2020 11/22/2020 11/21/2020  WBC 4.0 - 10.5 K/uL 10.3 11.3(H) 12.3(H)  Hemoglobin 13.0 - 17.0 g/dL 10.2(L) 9.5(L) 10.1(L)  Hematocrit 39.0 - 52.0 % 30.0(L) 28.9(L) 30.0(L)  Platelets 150 - 400 K/uL 136(L) 130(L) 138(L)   BMP Latest Ref Rng & Units 11/23/2020 11/22/2020 11/21/2020  Glucose 70 - 99 mg/dL 107(H) 128(H) 628(HH)  BUN 8 - 23 mg/dL 25(H) 42(H) 52(H)  Creatinine 0.61 - 1.24 mg/dL 1.35(H) 1.63(H) 2.02(H)  BUN/Creat Ratio 10 - 24 - - -  Sodium 135 - 145 mmol/L 136 136 129(L)  Potassium 3.5 - 5.1 mmol/L 3.6 3.7 4.5  Chloride 98 - 111 mmol/L 105 103 95(L)  CO2 22 - 32 mmol/L _1 Calcium 8.9 - 10.3 mg/dL 9.1 9.2 8.6(L)   Assessment/Plan: Dylan [name] is 75yo person living with chronic  diastolic heart failure, OSA, type II diabetes mellitus, CKD IIIb, CAD, PVD, hypertension, gout admitted 5/17 with acute GI bleed and hyperglycemia, now stable and ready for discharge.  Active Problems:   OSA (obstructive sleep apnea)   Stage 3b chronic kidney disease ([name])   GI bleed   AV block, 1st degree   Bradycardia   RBBB  #Acute GI bleed Per GI, likely patient experienced diverticular bleed, no further interventions needed at this time. Patient tolerating diet well without any acute complications. Hgb 10.2<9.5. Will continue with PPI twice daily for the next two weeks, followed by just once daily. Per PT/OT, patient appropriate for home health services as well. - Pantoprazole 41m BID - Daily CBC - Transfuse Hgb <7 - PT/OT - Home health needs  - F/u Dr. HBenson Norwayin two weeks  #Bradycardia #OSA Patient experienced bradycardia down to 30's overnight. ECG and troponins were obtained and overnight team spoke to cardiology. Patient at the time was not experiencing any symptoms. Per cardiology likely 2/2 untreated OSA. Troponin 32>35 today. Discussed importance of CPAP machine with Mr. HEvittthis morning, although appears he is not using it. Will need to follow-up with primary care physician for further discussions. - CPAP QHS - Follow-up with PCP  #Type II diabetes mellitus CBG's overnight elevated to 200's. This morning glucose normalized. Patient will need to continue checking sugars while at home and follow-up with  PCP for further management of diabetes. - CBG monitoring - SSI  #CKDIIIb sCr 1.3<1.6 w/ GFR normal. Patient has had continued good oral intake over last 24-48 hours. Will continue to encourage po intake as he is discharged. - Daily BMP - I/O - Avoid nephrotoxins  #Chronic diastolic heart failure #Hypertension Continues to appear euvolemic. Blood pressures remain at goal, will continue home antihypertensives. 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 Lab work has not returned, will have PCP follow these up. - F/u SPEP, UPEP  DIET: FLD IVF: n/a DVT PPX: SCD BOWEL: n/a CODE: FULL FAM COM: Attempted to call daughter, but number listed in chart did not work.  Prior to Admission Living Arrangement: Home Anticipated Discharge Location: Home Barriers to Discharge: n/a Dispo: Anticipated discharge in approximately 0 day(s).   Sanjuan Dame, MD 11/23/2020, 6:19 AM Pager: [phone] After 5pm on weekdays and 1pm on weekends: On Call pager [phone]

## 2020-11-23 NOTE — Progress Notes (Addendum)
OVERNIGHT EVENT NOTE:  Paged to bedside by RN for bradycardia. She notes that tele is reporting frequent episodes of bradycardia dipping down to 30bpm and 3rd degree heart block.   I evaluated pt at bedside who reports feeling "pretty well". He denies any dizziness, lightheadedness, chest pain, palpitations or shortness of breath. Denies feeling tired or weak.   Blood pressure (!) 103/46, pulse 64, temperature 98.3 F (36.8 C), temperature source Oral, resp. rate 18, height 5' 6" (1.676 m), weight 76.1 kg, SpO2 95 %.  On exam, he is well appearing and in NAD. Heart rate is regular but rhythm is irregular. Peripheral extremities are warm. He is alert and oriented.  EKG HR 47, PR 0.366, QRS 0.144, QTc 0.438 ms.  RBBB similar to prior. ?LBBB No new STE/TWI appreciated.  1st degree AVB  Plan: will review EKG with cardiology. Will also obtain serial trops and get morning labs drawn.  Addendum #1 2:07 AM  Reviewed EKG with cardiology who suspects this is sleep/OSA related as it began around the midnight. Recommend monitoring for another day to see if it continues during the day tomorrow, while he is awake. If it does, recommend formal cardiology consult.   Mitzi Hansen, MD Internal Medicine Resident PGY-2 [facility] Pager: [phone] 11/23/2020 2:07 AM

## 2020-11-23 NOTE — Discharge Summary (Signed)
Name: Dylan [name] MRN: [id number] DOB: [birth year] 75 y.o. PCP: Angelica Pou, MD  Date of Admission: 11/21/2020  4:42 PM Date of Discharge: 11/23/2020 Attending Physician: Campbell Riches, MD  Discharge Diagnosis: 1. Acute gastrointestinal bleed 2. Severe hyperglycemia 3. Gout 4. Obstructive sleep apnea 5. CKDIIIb 6. Gamma gap  Discharge Medications: Allergies as of 11/23/2020      Reactions   Ace Inhibitors Other (See Comments)   "ARF - see CRF overview"   Spironolactone Other (See Comments)   Gynecomastia per pt report      Medication List    STOP taking these medications   predniSONE 10 MG tablet Commonly known as: DELTASONE     TAKE these medications   allopurinol 100 MG tablet Commonly known as: ZYLOPRIM Take 100 mg by mouth daily.   aspirin 81 MG EC tablet Take 81 mg by mouth daily. Swallow whole.   atorvastatin 40 MG tablet Commonly known as: LIPITOR Take 1 tablet (40 mg total) by mouth daily.   carboxymethylcellulose 0.5 % Soln Commonly known as: REFRESH PLUS Place 1 drop into both eyes 3 (three) times daily as needed (dry eyes).   cycloSPORINE 0.05 % ophthalmic emulsion Commonly known as: Restasis Place 1 drop into both eyes 2 (two) times daily.   eplerenone 50 MG tablet Commonly known as: INSPRA Take 1 tablet (50 mg total) by mouth 2 (two) times daily.   FREESTYLE LITE test strip Generic drug: glucose blood Use to check blood sugar 3 times daily, DIAG CODE E11.29, INSULIN DEPENDENT What changed:   how much to take  how to take this  when to take this  additional instructions   hydrALAZINE 50 MG tablet Commonly known as: APRESOLINE Take 2 tablets (100 mg total) by mouth 3 (three) times daily.   labetalol 200 MG tablet Commonly known as: NORMODYNE Take 2 tablets (400 mg total) by mouth 2 (two) times daily.   minoxidil 10 MG tablet Commonly known as: LONITEN Take 2 tablets (20 mg total) by mouth daily.   pantoprazole  40 MG tablet Commonly known as: PROTONIX Take 1 tablet (40 mg total) by mouth 2 (two) times daily for 14 days, THEN 1 tablet (40 mg total) daily for 14 days. Start taking on: Nov 23, 2020   Pen Needles 31G X 5 MM Misc Inject 1 Dose into the skin daily. With Victoza DIAG CODE E11.29. INSULIN DEPENDENT   polyethylene glycol 17 g packet Commonly known as: MIRALAX / GLYCOLAX Take 17 g by mouth daily as needed (constipation).   tamsulosin 0.4 MG Caps capsule Commonly known as: FLOMAX Take 0.4 mg by mouth daily.   torsemide 10 MG tablet Commonly known as: Demadex Take 1 tablet in morning and 1 tablet in the evening. If your weight is up by 3lbs in 1 day, take another dose   Trulicity 3.14 HF/0.2OV Sopn Generic drug: Dulaglutide Inject 0.75 mg into the skin once a week for 13 doses. What changed: additional instructions   verapamil 360 MG 24 hr capsule Commonly known as: VERELAN PM Take 1 capsule (360 mg total) by mouth daily.            Durable Medical Equipment  (From admission, onward)         Start     Ordered   11/22/20 1450  For home use only DME Walker rolling  Once       Question Answer Comment  Walker: With 5 Inch Wheels   Patient needs  a walker to treat with the following condition Weakness      11/22/20 1449   11/22/20 1449  For home use only DME 3 n 1  Once        11/22/20 1449         Disposition and follow-up:   [name] was discharged from [facility] in Stable condition.  At the hospital follow up visit please address:  1. Acute gastrointestinal bleed: Thought to be 2/2 diverticular bleed. Patient to continue PPI BID for 2 weeks followed by PPI daily. Patient to schedule appointment with GI 2 weeks after discharge.  2. Severe hyperglycemia: Likely 2/2 steroid use. Last A1c at goal. Please check CBG at follow-up appointment.  3. Obstructive sleep apnea: Patient had episode of bradycardia and heart block while asleep with  spontaneous resolution. Cardiology thought 2/2 untreated OSA. Will need to continue to educate patient on importance of CPAP.  4. Gamma gap: Please follow-up labs for further evaluation.  5.  Labs / imaging needed at time of follow-up: CBC, BMP  6.  Pending labs/ test needing follow-up: SPEP, UPEP  Follow-up Appointments:    Follow-up Information    Angelica Pou, MD. Go on 11/28/2020.   Specialty: Internal Medicine Why: You have an appointment at 8:45AM on May 24th. Contact information: [name] [address] [phone]        Carol Ada, MD. Schedule an appointment as soon as possible for a visit in 2 week(s).   Specialty: Gastroenterology Contact information: [place], [place] [address] [place], [place] Follow up.   Specialty: Home Health Services Why: Your home health has been set up with [facility]. The office will call you with start of service dates. Contact information: [place] [address] [phone]              Hospital Course by problem list: 1. Acute gastrointestinal bleed: Patient arrived to [facility]MT[facility] clinic on 5/17 with two days of rectal bleeding. Previously had colonoscopy in August 2020 for similar presentation and assumed to have diverticular bleed. Recently he was started on steroids for gout flare, no use of NSAIDs. Initially patient was hypotensive 92/47. IVF was subsequently started and we were called for admission. Initial lab work notable for Hgb 10.1, down from baseline of ~12 and elevated BUN 52. GI was consulted for further evaluation. They felt the bleed was likely diverticular and there was no acute indication for any invasive procedures. Patient's diet was slowly advanced and patient tolerated. Hgb remained stable throughout admission, minimal rectal bleeding reported. On day of discharge patient tolerated food and was discharged,  hemodynamically stable. Patient to follow up with GI in 2 weeks.  2. Severe hyperglycemia: On arrival to the clinic, patient's glucose markedly elevated at 628. Patient had had decreased po intake but has been on steroid course for gout flare. Initial BMP without evidence of acidosis. Last A1c 5.7%, patient does not take insulin at home. Insulin drip was subsequently started and patient's sugars decreased overnight. Patient was switched to sliding scale insulin. Patient's sugars remained 100-250 throughout rest of admission. Patient was discharged on his home diabetic medications.  3. Gout: Gout appears to be controlled. He had been taking a course of steroids for a flare. Likely steroids could have played a role in the hyperglycemia and GI bleed. Will discharge on home medications, holding  further steroids.  4. Obstructive sleep apnea: The night prior to discharge patient was found to have heart block with bradycardia while asleep. Patient completely asymptomatic. Cardiology was consulted, thought likely to be due to untreated obstructive sleep apnea. Troponin 32>35. Discussed this with patient, states he does not like wearing the CPAP. Encouraged patient to wear this at home and explained importance. Will need to follow-up with PCP for further education.  5. CKDIIIb: Renal function at baseline upon arrival and remained throughout admission.  6. Gamma gap: Patient found to have protein gap w/ CMP. SPEP, UPEP ordered, will have PCP follow this up.  Discharge Exam:   BP 120/73 (BP Location: Left Arm)   Pulse 84   Temp 98.4 F (36.9 C) (Oral)   Resp 16   Ht _0  (1.676 m)   Wt 76.1 kg   SpO2 96%   BMI 27.08 kg/m  General: Laying in bed, no acute distress HENT: Normocephalic, atraumatic CV: Regular rate, rhythm. No m/r/g appreciated. Pulm: Normal work of breathing, clear to auscultation bilaterally. Abd: Soft, non-tender, non-distended. Normoactive bowel sounds. MSK: Normal bulk, tone. Non  edematous bilateral lower extremities. Skin: Warm, dry. No rashes or lesions appreciated. Neuro: Awake, alert, answering questions appropriately. Psych: Normal mood, affect, speech.  Pertinent Labs, Studies, and Procedures:  CBC Latest Ref Rng & Units 11/23/2020 11/22/2020 11/21/2020  WBC 4.0 - 10.5 K/uL 10.3 11.3(H) 12.3(H)  Hemoglobin 13.0 - 17.0 g/dL 10.2(L) 9.5(L) 10.1(L)  Hematocrit 39.0 - 52.0 % 30.0(L) 28.9(L) 30.0(L)  Platelets 150 - 400 K/uL 136(L) 130(L) 138(L)   BMP Latest Ref Rng & Units 11/23/2020 11/22/2020 11/21/2020  Glucose 70 - 99 mg/dL 107(H) 128(H) 628(HH)  BUN 8 - 23 mg/dL 25(H) 42(H) 52(H)  Creatinine 0.61 - 1.24 mg/dL 1.35(H) 1.63(H) 2.02(H)  BUN/Creat Ratio 10 - 24 - - -  Sodium 135 - 145 mmol/L 136 136 129(L)  Potassium 3.5 - 5.1 mmol/L 3.6 3.7 4.5  Chloride 98 - 111 mmol/L 105 103 95(L)  CO2 22 - 32 mmol/L _1 Calcium 8.9 - 10.3 mg/dL 9.1 9.2 8.6(L)   Discharge Instructions:  Mr. Minter,  I am glad you are feeling better and can be discharged today! You were admitted because of bleeding from your GI tract. Our gastroenterology doctors saw you and felt like this would resolve on its own. Thankfully it appears the bleeding is stable and you are good for discharge! Please see the following notes:  Continue with your regular home medications. You will stop the steroids now, as this could have caused some of your symptoms you experienced.  We have added a medication called pantoprazole. This can help prevent bleeding. For the next two weeks you will take one pill twice daily. After that you will take one pill daily.  Otherwise please make sure to schedule an appointment with Dr. Benson Norway in 2 weeks. Please make sure to schedule an appointment at the clinic within the next week.  It was a pleasure meeting you, Mr. Rogacki. I wish you the best and hope you stay happy and healthy!  Thank you, Sanjuan Dame, MD  Signed: Sanjuan Dame, MD 11/23/2020, 1:07 PM    Pager: [phone]

## 2020-11-24 ENCOUNTER — Encounter: Payer: Self-pay | Admitting: *Deleted

## 2020-11-24 LAB — PROTEIN ELECTROPHORESIS, SERUM
A/G Ratio: 1.2 (ref 0.7–1.7)
Albumin ELP: 3.2 g/dL (ref 2.9–4.4)
Alpha-1-Globulin: 0.2 g/dL (ref 0.0–0.4)
Alpha-2-Globulin: 0.6 g/dL (ref 0.4–1.0)
Beta Globulin: 0.8 g/dL (ref 0.7–1.3)
Gamma Globulin: 1.1 g/dL (ref 0.4–1.8)
Globulin, Total: 2.7 g/dL (ref 2.2–3.9)
Total Protein ELP: 5.9 g/dL — ABNORMAL LOW (ref 6.0–8.5)

## 2020-11-24 NOTE — Progress Notes (Signed)
Internal Medicine Clinic Attending  Case discussed with Dr. Court Joy  At the time of the visit.  We reviewed the resident's history and exam and pertinent patient test results.  I agree with the assessment, diagnosis, and plan of care documented in the resident's note.

## 2020-11-24 NOTE — Progress Notes (Unsigned)
Dear PCP Your patient is scheduled for a Medicare annual wellness visit with the RN.  Please create a new encounter, use the smart phrase [place], fill out the "Personalized health plan", "Things that may be affecting your health", and "Medicare covered preventative screenings and services".  Please do not address every item in the preventative screenings and services but the ones that will be most meaningful and relevant to your patient. Please sign and route the form within the next 7 days to [facility].

## 2020-11-27 ENCOUNTER — Telehealth: Payer: Self-pay

## 2020-11-27 DIAGNOSIS — I422 Other hypertrophic cardiomyopathy: Secondary | ICD-10-CM | POA: Diagnosis not present

## 2020-11-27 DIAGNOSIS — I700 Atherosclerosis of aorta: Secondary | ICD-10-CM | POA: Diagnosis not present

## 2020-11-27 DIAGNOSIS — J9610 Chronic respiratory failure, unspecified whether with hypoxia or hypercapnia: Secondary | ICD-10-CM | POA: Diagnosis not present

## 2020-11-27 DIAGNOSIS — I2721 Secondary pulmonary arterial hypertension: Secondary | ICD-10-CM | POA: Diagnosis not present

## 2020-11-27 DIAGNOSIS — E871 Hypo-osmolality and hyponatremia: Secondary | ICD-10-CM | POA: Diagnosis not present

## 2020-11-27 DIAGNOSIS — E1165 Type 2 diabetes mellitus with hyperglycemia: Secondary | ICD-10-CM | POA: Diagnosis not present

## 2020-11-27 DIAGNOSIS — E1122 Type 2 diabetes mellitus with diabetic chronic kidney disease: Secondary | ICD-10-CM | POA: Diagnosis not present

## 2020-11-27 DIAGNOSIS — Z7982 Long term (current) use of aspirin: Secondary | ICD-10-CM | POA: Diagnosis not present

## 2020-11-27 DIAGNOSIS — Z87891 Personal history of nicotine dependence: Secondary | ICD-10-CM | POA: Diagnosis not present

## 2020-11-27 DIAGNOSIS — N1832 Chronic kidney disease, stage 3b: Secondary | ICD-10-CM | POA: Diagnosis not present

## 2020-11-27 DIAGNOSIS — G4733 Obstructive sleep apnea (adult) (pediatric): Secondary | ICD-10-CM | POA: Diagnosis not present

## 2020-11-27 DIAGNOSIS — E669 Obesity, unspecified: Secondary | ICD-10-CM | POA: Diagnosis not present

## 2020-11-27 DIAGNOSIS — I2510 Atherosclerotic heart disease of native coronary artery without angina pectoris: Secondary | ICD-10-CM | POA: Diagnosis not present

## 2020-11-27 DIAGNOSIS — I70219 Atherosclerosis of native arteries of extremities with intermittent claudication, unspecified extremity: Secondary | ICD-10-CM | POA: Diagnosis not present

## 2020-11-27 DIAGNOSIS — I130 Hypertensive heart and chronic kidney disease with heart failure and stage 1 through stage 4 chronic kidney disease, or unspecified chronic kidney disease: Secondary | ICD-10-CM | POA: Diagnosis not present

## 2020-11-27 DIAGNOSIS — D696 Thrombocytopenia, unspecified: Secondary | ICD-10-CM | POA: Diagnosis not present

## 2020-11-27 DIAGNOSIS — M109 Gout, unspecified: Secondary | ICD-10-CM | POA: Diagnosis not present

## 2020-11-27 DIAGNOSIS — I5032 Chronic diastolic (congestive) heart failure: Secondary | ICD-10-CM | POA: Diagnosis not present

## 2020-11-27 DIAGNOSIS — D631 Anemia in chronic kidney disease: Secondary | ICD-10-CM | POA: Diagnosis not present

## 2020-11-27 DIAGNOSIS — E1151 Type 2 diabetes mellitus with diabetic peripheral angiopathy without gangrene: Secondary | ICD-10-CM | POA: Diagnosis not present

## 2020-11-27 DIAGNOSIS — I951 Orthostatic hypotension: Secondary | ICD-10-CM | POA: Diagnosis not present

## 2020-11-27 NOTE — Telephone Encounter (Signed)
Transition Care Management Unsuccessful Follow-up Telephone Call  Date of discharge and from where:  11/23/2020 [facility]  Attempts:  1st Attempt  Reason for unsuccessful TCM follow-up call:  Unable to leave message; no answer and [facility] has not been set up.  Hubbard Hartshorn, BSN, RN-BC

## 2020-11-27 NOTE — Telephone Encounter (Signed)
Yes, I agree

## 2020-11-27 NOTE — Telephone Encounter (Signed)
TC X2 to Mr. Czarnecki, no answer, VM box is full and nurse unable to leave message. TC to [place], [facility] nurse, she was informed that MD agreed pt is to hold all b/p meds (Verapamil, labetalol, apresoline, Inspra, and Torsemide) until patient see's Dr. Allyson Sabal in clinic tomorrow morning.  Tillie Rung states she will call his EC (children) with the instructions and if they cannot get in touch with him, she will go by the patient's home and give him the instructions. SChaplin, RN,BSN

## 2020-11-27 NOTE — Telephone Encounter (Signed)
Received TC from [facility] nurse, Tillie Rung, she states she was in the home today and patient's b/p was low:  90/50 sitting 60/40 standing Patient was symptomatic when standing, orthostatic hypotension discussed and [place] informed patient to hold all b/p meds until patient until he heard MD was in agreement.  Pt has a scheduled appt tomorrow w/ Dr. Allyson Sabal at [phone].  He is on several b/p meds and taking some 2 and 3 times daily: Verapamil Labetalol Apresoline Inspra Torsemide  Forwarding to Dr. Armanda Magic to advise if MD is ok w/ pt holding all above meds until after appt with him tomorrow.  [place] nurse asked if RN could call patient and let him know about meds.  If he doesn't answer, call [facility] nurse and she will get in touch with him.  This RN also gave VO which were requested by [facility] nurse for PT, 1X/week for 4 weeks And  Skill Nursing Evaluation later this week for medication/disease management.  Thank you, SChaplin, RN,BSN

## 2020-11-28 ENCOUNTER — Encounter: Payer: Self-pay | Admitting: Student

## 2020-11-28 ENCOUNTER — Ambulatory Visit (INDEPENDENT_AMBULATORY_CARE_PROVIDER_SITE_OTHER): Payer: Medicare Other | Admitting: Student

## 2020-11-28 VITALS — BP 133/61 | HR 71 | Temp 99.1°F | Ht 66.0 in | Wt 160.8 lb

## 2020-11-28 DIAGNOSIS — N1832 Chronic kidney disease, stage 3b: Secondary | ICD-10-CM | POA: Diagnosis not present

## 2020-11-28 DIAGNOSIS — Z87438 Personal history of other diseases of male genital organs: Secondary | ICD-10-CM

## 2020-11-28 DIAGNOSIS — K5791 Diverticulosis of intestine, part unspecified, without perforation or abscess with bleeding: Secondary | ICD-10-CM | POA: Diagnosis not present

## 2020-11-28 DIAGNOSIS — Z125 Encounter for screening for malignant neoplasm of prostate: Secondary | ICD-10-CM

## 2020-11-28 DIAGNOSIS — I951 Orthostatic hypotension: Secondary | ICD-10-CM | POA: Diagnosis not present

## 2020-11-28 DIAGNOSIS — R634 Abnormal weight loss: Secondary | ICD-10-CM | POA: Diagnosis not present

## 2020-11-28 DIAGNOSIS — I10 Essential (primary) hypertension: Secondary | ICD-10-CM | POA: Diagnosis not present

## 2020-11-28 DIAGNOSIS — I1A0 Resistant hypertension: Secondary | ICD-10-CM

## 2020-11-28 DIAGNOSIS — N400 Enlarged prostate without lower urinary tract symptoms: Secondary | ICD-10-CM

## 2020-11-28 HISTORY — DX: Abnormal weight loss: R63.4

## 2020-11-28 HISTORY — DX: Orthostatic hypotension: I95.1

## 2020-11-28 MED ORDER — MINOXIDIL 10 MG PO TABS: 20.0000 mg | ORAL_TABLET | Freq: Every day | ORAL | 3 refills | Status: DC

## 2020-11-28 MED ORDER — EPLERENONE 50 MG PO TABS: 50.0000 mg | ORAL_TABLET | Freq: Two times a day (BID) | ORAL | 3 refills | Status: DC

## 2020-11-28 MED ORDER — HYDRALAZINE HCL 50 MG PO TABS: 100.0000 mg | ORAL_TABLET | Freq: Three times a day (TID) | ORAL | 3 refills | Status: DC

## 2020-11-28 NOTE — Assessment & Plan Note (Signed)
Received message from patient's home health nurse yesterday about patient developing orthostatic hypotension.  Reported sitting blood pressure 90/50 and standing blood pressures 60/40.  Patient does report that he was feeling lightheaded and dizzy yesterday.  During encounter today, orthostatics as follows: Lying 134/67, sitting 124/57, standing 102/53, standing at 3 minutes 115/62.  Patient still is orthostatic today although he reports no associated lightheadedness or dizziness upon standing.  He states that he drinks about 2 small bottles of water a day.  Patient is on multiple antihypertensives to control resistant hypertension.  Of these, the 2 medications that could likely impact orthostatic hypotension are labetalol 400 mg twice daily and torsemide 10 mg twice daily.  On exam today patient is dry.  Given these findings, will trial holding torsemide for the next few days and encouraging p.o. fluid intake (recommended 2 to 3 L of water per day).  We will follow-up with patient on Friday to repeat orthostatic vitals.  Plan: -Hold torsemide 10 mg twice daily -Encouraged p.o. intake (2 to 3 L of water daily) -Follow-up in 3 days for repeat orthostatics

## 2020-11-28 NOTE — Progress Notes (Signed)
CC: hospital f/u of GI bleed and orthostatic hypotension  HPI:  [name] is a 75 y.o. male with history listed below presenting to the [facility] for hospital f/u of GI bleed and orthostatic hypotension. Please see individualized problem based charting for full HPI.  Past Medical History:  Diagnosis Date  . Anemia 10/18/2014   Baseline about 12 and stable from 2010 to 2016. Colon 2009 in [place] (records cannot be obtained).  EGD Dr Benson Norway 2011 nl   . Aortic atherosclerosis ([place]) 03/28/2020   Incidental finding on imaging CT   . Atherosclerosis 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 ([place]) 12/28/2014   ABI Feb 2017 R 0.49; L 0.72 with diffuse dz ABI Aug 2017 R 0.49; L 0.69 ABI Jan 2018 R 0.61; L 0.73  ABI Feb 2019 R 0.55; L 0.71 Sees Dr Bridgett Larsson - recs ABI q 6 months  . 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 Grade 2. EF 50-55%; echo repeated 2019, severe hypertrophy with elevated filling pressures  . Chronic kidney disease, stage 3b ([place]) 03/09/2020   Dr. Justin Mend  Nephrologist, f/u Q 48M  . Coronary artery disease without angina pectoris 04/04/2018  . Degloving injury of finger 03/28/2020   10/21/19: copied from op note "1.  Open reduction percutaneous pinning left small finger proximal phalanx fracture 2.  Complex repair of laceration to left small finger 3 cm in length 3.  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 No significant callus formation is noted. Pins had been removed  . Diverticulosis 10/08/2014   Seen on CT. Reportedly on Colon in [place] in 2009. Freq bouts of diverticulitis.  . DM (diabetes mellitus), type 2 with renal complications ([place]) 11/10/2534  . Former tobacco use 10/08/2014  . Gout   . 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 Stable from 2010 to  2016.  Negative SPEP and UPEP 2014 after ARF 2/2 continued ACE (lisinopril 20) use while vol contracted. Dr Justin Mend  . Obesity (BMI 30.0-34.9) 03/09/2020  . 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 OSA (obstructive sleep apnea) 10/08/2014   July 2016 : Severe OSA/hypopnea syndrome, AHI 128.4, O2 nadir of 84% RA. Failed CPAP on study. BiPA inspiratory pressure of 21 and expiratory pressure of 17 CWP. Consider ENT evaluation for potentially correctable upper airway obstruction contributing to the need for unusually high pressures - ENT July 2015 did not feel any intervention surgically was indicated. He wore a large F&P Simplus fullfac  . Personal history of colonic polyps 03/28/2014   Dr. Benson Norway, 2 polyps removed 2015.  No polyps on f/u in 2020.  No further surveillance suggested per Dr. Benson Norway.  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  He is unable to breathe through his nose comfortably.  This is part of the reason why he does not wear his oxygen regularly.  Nasal passages are nearly completely obstructed erythematous smooth glistening surface.  He has been told by his eye doctor that part of the reason his e  . Resistant hypertension 10/09/2014   Poor control with 6 drug therapy. 2016 : Aldo 37 but ARR 23.5   . Severe pulmonary arterial systolic hypertension ([place]) 10/18/2014  Noted as severe ECHO 2016 and 2019. Likely 2/2 severe untreated OSA. Pt is not adherent to CPAP.  Marland Kitchen Thrombocytopenia ([place]) 10/09/2016   Nl liver and spleen on [place] 2019  . Ulcer aphthous oral 04/27/2020   Evaluated emergency room last week, saw with Magic mouthwash    Review of Systems:  Negative aside from that listed in individualized problem based charting.  Physical Exam:  Vitals:   11/28/20 0905  BP: 133/61  Pulse: 71  Temp: 99.1 F (37.3 C)  TempSrc: Oral  SpO2: 98%  Weight: 160 lb 12.8 oz (72.9 kg)   Height: 5' 6" (1.676 m)   Physical Exam Constitutional:      Comments: Chronically ill-appearing  HENT:     Nose: Nose normal. No congestion.     Mouth/Throat:     Mouth: Mucous membranes are dry.     Pharynx: Oropharynx is clear. No oropharyngeal exudate.  Eyes:     Extraocular Movements: Extraocular movements intact.     Conjunctiva/sclera: Conjunctivae normal.     Pupils: Pupils are equal, round, and reactive to light.  Cardiovascular:     Rate and Rhythm: Normal rate and regular rhythm.     Pulses: Normal pulses.     Heart sounds: Normal heart sounds.  Pulmonary:     Effort: Pulmonary effort is normal. No respiratory distress.     Breath sounds: Normal breath sounds. No wheezing, rhonchi or rales.  Abdominal:     General: Bowel sounds are normal. There is no distension.     Palpations: Abdomen is soft.     Tenderness: There is no abdominal tenderness. There is no guarding or rebound.  Musculoskeletal:        General: No swelling. Normal range of motion.  Skin:    General: Skin is warm and dry.  Neurological:     General: No focal deficit present.     Mental Status: He is alert and oriented to person, place, and time.  Psychiatric:        Mood and Affect: Mood normal.        Behavior: Behavior normal.      Assessment & Plan:   See Encounters Tab for problem based charting.  Patient discussed with Dr. Philipp Ovens

## 2020-11-28 NOTE — Patient Instructions (Signed)
Dylan [name],  It was a pleasure seeing you in the clinic today.   1. Please stop taking Torsemide at home. Also, please drink about 2-3 liters of water daily.  2. We are getting some blood work today. I will call you with the lab results.  3. Please make sure to follow up with Dr. Benson Norway on 12/11/2020 and please make sure to discuss your weight loss with him as you will likely need an Endoscopy.  4. Please come back in 3 days for a follow up visit to make sure your blood pressure is not going low when you stand up.  Please call our clinic at [phone] if you have any questions or concerns. The best time to call is Monday-Friday from 9am-4pm, but there is someone available 24/7 at the same number. If you need medication refills, please notify your pharmacy one week in advance and they will send [place] a request.   Thank you for letting us take part in your care. We look forward to seeing you next time!

## 2020-11-28 NOTE — Assessment & Plan Note (Signed)
Patient with history of resistant hypertension likely secondary to untreated obstructive sleep apnea.  He is currently on hydralazine 100 mg 3 times daily, labetalol 400 mg twice daily, minoxidil 20 mg daily, verapamil 360 mg daily, torsemide 10 mg twice daily, eplerenone 50 mg twice daily.  Discussed at length with patient about benefits of using CPAP for obstructive sleep apnea and seeing whether or not this improves his resistant hypertension.  However patient is adamant that he will not try CPAP as it is very uncomfortable and he would rather not use it.  BP today is well controlled at 133/61.  -Continue current medications, but will hold torsemide (see orthostatic hypotension)

## 2020-11-28 NOTE — Assessment & Plan Note (Signed)
Patient with recent history of unintentional weight loss of about 40 pounds since March 2022.  Given his age, screening is required to rule out malignancy as cause of weight loss.  He has a 15-pack-year smoking history in the past, but he is not a candidate for low-dose CT scan to screen for lung cancer as he does not have any pulmonary symptoms to suggest this.  He does have history of BPH which along with this weight loss raises the flag for possible prostate malignancy.  Will obtain PSA for further evaluation.  Patient was also recently hospitalized for acute GI bleed that was attributed to diverticulosis.  This given with recent unintentional weight loss in an elderly male is very concerning for possible GI malignancy.  He last had a colonoscopy in 2020 that only showed diverticulosis.  However he has not yet undergone an endoscopy in the past and his recent weight loss began in 2022.  He is due for follow-up with Dr. Benson Norway, GI, on 12/11/2020 and he will discuss unintentional weight loss with Dr. Benson Norway as well.  Plan: -Follow-up PSA -Visit with Dr. Benson Norway, would benefit from EGD to rule out GI malignancy

## 2020-11-28 NOTE — Assessment & Plan Note (Addendum)
Patient recently hospitalized for acute GI bleed thought to be secondary to diverticulosis.  He has completed 1 week of twice daily PPI therapy and is due for another week of this.  Then he will begin PPI daily.  Reports that his rectal bleeding has been improving but he is still been having some hematochezia intermittently, but he did not note any yesterday.  He is due for follow-up with Dr. Almyra Free, GI, on 12/11/2020.  Will obtain CBC today to assess hemoglobin level.  Plan: -f/u CBC -visit with Dr. Benson Norway on 12/11/2020

## 2020-11-29 DIAGNOSIS — N401 Enlarged prostate with lower urinary tract symptoms: Secondary | ICD-10-CM | POA: Diagnosis not present

## 2020-11-29 DIAGNOSIS — R350 Frequency of micturition: Secondary | ICD-10-CM | POA: Diagnosis not present

## 2020-11-29 DIAGNOSIS — N3941 Urge incontinence: Secondary | ICD-10-CM | POA: Diagnosis not present

## 2020-11-29 LAB — BMP8+ANION GAP
Anion Gap: 17 mmol/L (ref 10.0–18.0)
BUN/Creatinine Ratio: 13 (ref 10–24)
BUN: 26 mg/dL (ref 8–27)
CO2: 23 mmol/L (ref 20–29)
Calcium: 9.4 mg/dL (ref 8.6–10.2)
Chloride: 99 mmol/L (ref 96–106)
Creatinine, Ser: 2.04 mg/dL — ABNORMAL HIGH (ref 0.76–1.27)
Glucose: 158 mg/dL — ABNORMAL HIGH (ref 65–99)
Potassium: 3.7 mmol/L (ref 3.5–5.2)
Sodium: 139 mmol/L (ref 134–144)
eGFR: 33 mL/min/{1.73_m2} — ABNORMAL LOW (ref >59)

## 2020-11-29 LAB — CBC
Hematocrit: 32.8 % — ABNORMAL LOW (ref 37.5–51.0)
Hemoglobin: 10.2 g/dL — ABNORMAL LOW (ref 13.0–17.7)
MCH: 29.7 pg (ref 26.6–33.0)
MCHC: 31.1 g/dL — ABNORMAL LOW (ref 31.5–35.7)
MCV: 95 fL (ref 79–97)
Platelets: 156 10*3/uL (ref 150–450)
RBC: 3.44 x10E6/uL — ABNORMAL LOW (ref 4.14–5.80)
RDW: 13.2 % (ref 11.6–15.4)
WBC: 7.6 10*3/uL (ref 3.4–10.8)

## 2020-11-29 LAB — PSA: Prostate Specific Ag, Serum: 1.7 ng/mL (ref 0.0–4.0)

## 2020-11-29 NOTE — Progress Notes (Signed)
Internal Medicine Clinic Attending  Case discussed with Dr. Allyson Sabal  At the time of the visit.  We reviewed the resident's history and exam and pertinent patient test results.  I agree with the assessment, diagnosis, and plan of care documented in the resident's note.   BMP resulted with AKI. We are holding torsemide and encouraged PO intake for his orthostatic hypotension. He has followed up scheduled for the end of the week. Plan to reassess volume status and repeat BMP at that visit.

## 2020-11-30 ENCOUNTER — Telehealth: Payer: Self-pay | Admitting: Internal Medicine

## 2020-12-01 ENCOUNTER — Ambulatory Visit (INDEPENDENT_AMBULATORY_CARE_PROVIDER_SITE_OTHER): Payer: Medicare Other | Admitting: Internal Medicine

## 2020-12-01 ENCOUNTER — Encounter: Payer: Self-pay | Admitting: Internal Medicine

## 2020-12-01 VITALS — BP 108/59 | HR 76 | Temp 98.6°F | Ht 66.0 in | Wt 163.4 lb

## 2020-12-01 DIAGNOSIS — I10 Essential (primary) hypertension: Secondary | ICD-10-CM

## 2020-12-01 DIAGNOSIS — K625 Hemorrhage of anus and rectum: Secondary | ICD-10-CM

## 2020-12-01 DIAGNOSIS — I951 Orthostatic hypotension: Secondary | ICD-10-CM

## 2020-12-01 MED ORDER — LABETALOL HCL 200 MG PO TABS: 200.0000 mg | ORAL_TABLET | Freq: Every day | ORAL | 0 refills | Status: DC

## 2020-12-01 MED ORDER — MINOXIDIL 10 MG PO TABS: 10.0000 mg | ORAL_TABLET | Freq: Every day | ORAL | 3 refills | Status: DC

## 2020-12-01 NOTE — Progress Notes (Signed)
CC: orthostatic hypotension  HPI:  [name] is a 75 y.o. with a PMHx as listed below who presents to the clinic for orthostatic hypotension.   Please see the Encounters tab for problem-based Assessment & Plan regarding status of patient's acute and chronic conditions.  Past Medical History:  Diagnosis Date  . Anemia 10/18/2014   Baseline about 12 and stable from 2010 to 2016. Colon 2009 in [place] (records cannot be obtained).  EGD Dylan [name] 2011 nl   . Aortic atherosclerosis ([place]) 03/28/2020   Incidental finding on imaging CT   . Atherosclerosis 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 ([place]) 12/28/2014   ABI Feb 2017 R 0.49; L 0.72 with diffuse dz ABI Aug 2017 R 0.49; L 0.69 ABI Jan 2018 R 0.61; L 0.73  ABI Feb 2019 R 0.55; L 0.71 Sees Dylan [name] - recs ABI q 6 months  . Chronic diastolic 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 ([place]) 10/18/2014   Noted ECHO 10/2014. Grade 2. EF 50-55%; echo repeated 2019, severe hypertrophy with elevated filling pressures  . Chronic kidney disease, stage 3b ([place]) 03/09/2020   Dylan. Justin [name]  Nephrologist, f/u Q 30M  . Coronary artery disease without angina pectoris 04/04/2018  . Degloving injury of finger 03/28/2020   10/21/19: copied from op note "1.  Open reduction percutaneous pinning left small finger proximal phalanx fracture 2.  Complex repair of laceration to left small finger 3 cm in length 3.  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 No significant callus formation is noted. Pins had been removed  . Diverticulosis 10/08/2014   Seen on CT. Reportedly on Colon in [place] in 2009. Freq bouts of diverticulitis.  . DM (diabetes mellitus), type 2 with renal complications ([place]) 08/12/9561  . Former tobacco use 10/08/2014  . Gout   . 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 Stable  from 2010 to 2016.  Negative SPEP and UPEP 2014 after ARF 2/2 continued ACE (lisinopril 20) use while vol contracted. Dylan Dylan [name]  . Obesity (BMI 30.0-34.9) 03/09/2020  . 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 OSA (obstructive sleep apnea) 10/08/2014   July 2016 : Severe OSA/hypopnea syndrome, AHI 128.4, O2 nadir of 84% RA. Failed CPAP on study. BiPA inspiratory pressure of 21 and expiratory pressure of 17 CWP. Consider ENT evaluation for potentially correctable upper airway obstruction contributing to the need for unusually high pressures - ENT July 2015 did not feel any intervention surgically was indicated. He wore a large F&P Simplus fullfac  . Personal history of colonic polyps 03/28/2014   Dylan. Benson [name], 2 polyps removed 2015.  No polyps on f/u in 2020.  No further surveillance suggested per Dylan. Benson [name].  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  He is unable to breathe through his nose comfortably.  This is part of the reason why he does not wear his oxygen regularly.  Nasal passages are nearly completely obstructed erythematous smooth glistening surface.  He has been told by his eye doctor that part of the reason his e  . Resistant hypertension 10/09/2014   Poor control with 6 drug therapy. 2016 : Aldo 37 but ARR 23.5   . Severe pulmonary arterial systolic hypertension ([place]) 10/18/2014  Noted as severe ECHO 2016 and 2019. Likely 2/2 severe untreated OSA. Pt is not adherent to CPAP.  Marland Kitchen Thrombocytopenia ([place]) 10/09/2016   Nl liver and spleen on [place] 2019  . Ulcer aphthous oral 04/27/2020   Evaluated emergency room last week, saw with Magic mouthwash   Review of Systems: Review of Systems  Musculoskeletal: Negative for falls.  Neurological: Positive for dizziness. Negative for loss of consciousness.   Physical Exam:  Vitals:   12/01/20 1039 12/01/20 1052 12/01/20 1055 12/01/20 1056   BP: (!) 136/58 (!) 146/56 (!) 130/53 (!) 108/59  Pulse: 73 71 72 76  Temp: 98.6 F (37 C)     TempSrc: Oral     SpO2: 97%     Weight: 163 lb 6.4 oz (74.1 kg)     Height: _0  (1.676 m)      Physical Exam Vitals and nursing note reviewed.  Constitutional:      General: He is not in acute distress.    Appearance: He is normal weight.  Cardiovascular:     Rate and Rhythm: Normal rate and regular rhythm.  Pulmonary:     Effort: Pulmonary effort is normal. No respiratory distress.     Breath sounds: Normal breath sounds. No wheezing, rhonchi or rales.  Musculoskeletal:     Right lower leg: No edema.     Left lower leg: No edema.  Skin:    General: Skin is warm and dry.  Neurological:     Mental Status: He is alert and oriented to person, place, and time. Mental status is at baseline.  Psychiatric:        Mood and Affect: Mood normal.        Behavior: Behavior normal.     Assessment & Plan:   See Encounters Tab for problem based charting.  Patient discussed with Dylan. [place]

## 2020-12-01 NOTE — Progress Notes (Signed)
Internal Medicine Clinic Attending  Case discussed with Dr. Charleen Kirks  At the time of the visit.  We reviewed the resident's history and exam and pertinent patient test results.  I agree with the assessment, diagnosis, and plan of care documented in the resident's note. I worry this hypotension is due to antihypertensive over treatment.  We will cut back on labetalol and also midoxinil (especially given we are hold diuretic)

## 2020-12-01 NOTE — Patient Instructions (Signed)
It was nice seeing you today! Thank you for choosing [facility] for your Primary Care.    Today we talked about:   1. Orthostatic Hypotension - Low blood pressure when standing   a. Do not restart Torsemide  b. Decrease Labetalol to only 1 tablet per day.   c. Decrease Minoxidil to only 1 tablet per day.

## 2020-12-01 NOTE — Assessment & Plan Note (Addendum)
Dylan [name] is here for follow-up of orthostatic hypotension.  He states that at his last visit, he was instructed to cut his labetalol in half by only taking 1 tablet in the morning and 1 tablet in the evening versus 2 in addition he was instructed discontinue his torsemide.  He has not significantly increase his water intake but notes he is drinking at least 1 L of water per day.  He notes dizziness upon standing but has not had any falls at this time.  No dizziness with sitting or lying down.  Assessment/plan: Blood pressure on arrival to the clinic was 136/58.  Orthostatics were obtained as listed below:  Lying: 146/56 Sitting: 130/53 Standing: 108/59  Orthostatics remain significantly positive with symptoms present including dizziness.  No falls thus far although patient is at high risk.    I suspect orthostasis is multifactorial secondary to overdiuresis with patient taking torsemide previously in addition to the restarting of all his blood pressure medications after recent hospitalization (for GI bleed at which time his medications were held.)  Patient has also had a 40 pound weight loss in the past year that likely led to a lower baseline blood pressure and less requirement for antihypertensives.  - Further decrease of labetalol to 1 tablet, once daily - Decrease Minoxidil to 1 tablet, once daily - Continue holding torsemide - Current blood pressure medications patient will remain unchanged: Eplerenone 50 mg twice daily, hydralazine 100 mg 3 times daily, verapamil 360 mg daily - Encouraged adequate p.o. intake of water and decreasing soda/coffee/juice intake - 1 week follow-up

## 2020-12-02 LAB — BMP8+ANION GAP
Anion Gap: 16 mmol/L (ref 10.0–18.0)
BUN/Creatinine Ratio: 15 (ref 10–24)
BUN: 25 mg/dL (ref 8–27)
CO2: 21 mmol/L (ref 20–29)
Calcium: 9.3 mg/dL (ref 8.6–10.2)
Chloride: 102 mmol/L (ref 96–106)
Creatinine, Ser: 1.66 mg/dL — ABNORMAL HIGH (ref 0.76–1.27)
Glucose: 116 mg/dL — ABNORMAL HIGH (ref 65–99)
Potassium: 3.5 mmol/L (ref 3.5–5.2)
Sodium: 139 mmol/L (ref 134–144)
eGFR: 42 mL/min/{1.73_m2} — ABNORMAL LOW (ref >59)

## 2020-12-04 DIAGNOSIS — I130 Hypertensive heart and chronic kidney disease with heart failure and stage 1 through stage 4 chronic kidney disease, or unspecified chronic kidney disease: Secondary | ICD-10-CM | POA: Diagnosis not present

## 2020-12-04 DIAGNOSIS — D631 Anemia in chronic kidney disease: Secondary | ICD-10-CM | POA: Diagnosis not present

## 2020-12-04 DIAGNOSIS — I5032 Chronic diastolic (congestive) heart failure: Secondary | ICD-10-CM | POA: Diagnosis not present

## 2020-12-04 DIAGNOSIS — E1122 Type 2 diabetes mellitus with diabetic chronic kidney disease: Secondary | ICD-10-CM | POA: Diagnosis not present

## 2020-12-04 DIAGNOSIS — N1832 Chronic kidney disease, stage 3b: Secondary | ICD-10-CM | POA: Diagnosis not present

## 2020-12-04 DIAGNOSIS — E1165 Type 2 diabetes mellitus with hyperglycemia: Secondary | ICD-10-CM | POA: Diagnosis not present

## 2020-12-05 NOTE — Progress Notes (Signed)
Renal function improving.

## 2020-12-06 DIAGNOSIS — N183 Chronic kidney disease, stage 3 unspecified: Secondary | ICD-10-CM | POA: Diagnosis not present

## 2020-12-06 DIAGNOSIS — E1122 Type 2 diabetes mellitus with diabetic chronic kidney disease: Secondary | ICD-10-CM | POA: Diagnosis not present

## 2020-12-06 DIAGNOSIS — I129 Hypertensive chronic kidney disease with stage 1 through stage 4 chronic kidney disease, or unspecified chronic kidney disease: Secondary | ICD-10-CM | POA: Diagnosis not present

## 2020-12-06 DIAGNOSIS — N2581 Secondary hyperparathyroidism of renal origin: Secondary | ICD-10-CM | POA: Diagnosis not present

## 2020-12-06 NOTE — Progress Notes (Signed)
Internal Medicine Clinic Attending  Case discussed with Dr. Alfonse Spruce  At the time of the visit.  We reviewed the resident's history and exam and pertinent patient test results.  I agree with the assessment, diagnosis, and plan of care documented in the resident's note.

## 2020-12-08 ENCOUNTER — Encounter: Payer: 59 | Admitting: Internal Medicine

## 2020-12-08 DIAGNOSIS — D631 Anemia in chronic kidney disease: Secondary | ICD-10-CM | POA: Diagnosis not present

## 2020-12-08 DIAGNOSIS — I130 Hypertensive heart and chronic kidney disease with heart failure and stage 1 through stage 4 chronic kidney disease, or unspecified chronic kidney disease: Secondary | ICD-10-CM | POA: Diagnosis not present

## 2020-12-08 DIAGNOSIS — I5032 Chronic diastolic (congestive) heart failure: Secondary | ICD-10-CM | POA: Diagnosis not present

## 2020-12-08 DIAGNOSIS — E1165 Type 2 diabetes mellitus with hyperglycemia: Secondary | ICD-10-CM | POA: Diagnosis not present

## 2020-12-08 DIAGNOSIS — N1832 Chronic kidney disease, stage 3b: Secondary | ICD-10-CM | POA: Diagnosis not present

## 2020-12-08 DIAGNOSIS — E1122 Type 2 diabetes mellitus with diabetic chronic kidney disease: Secondary | ICD-10-CM | POA: Diagnosis not present

## 2020-12-08 NOTE — Progress Notes (Deleted)
CC: F/u HTN  HPI:  Mr.Djibril LATHYN GRIGGS is a 75 y.o. With medical history significant for resistant hypertension on multiple antihypertensives complicated by orthostatic hypotension, chronic diastolic heart failure, severe pulmonary arterial hypertension, type 2 diabetes mellitus, gout, CKD stage IIIb here with complaints of***  Please see problem based charting for further details.  #Hypertension: His previous medications were hydralazine 100 mg 3 times daily, labetalol 400 mg twice daily, minoxidil 20 mg, verapamil 360 mg daily, torsemide 10 mg twice daily, eplerenone 50 mg twice daily.  His treatment has been complicated by orthostatic hypotension for which she was seen in the clinic on Dec 01, 2020.  At that visit, labetalol was decreased to 200 mg once daily, minoxidil was decreased to 10 mg daily and torsemide was held.  Patient was seen by nephrology yesterday and they recommended to discontinue labetalol and minoxidil.  Of note, he has lost about 38 pounds since March 2022  Plan: - Discontinue labetalol and minoxidil - Continue hydralazine 100 mg 3 times daily, verapamil 360 mg daily, eplerenone 50 mg twice daily -***Torsemide   #Gout:***.  He is currently on allopurinol 100 mg daily.  His last uric acid that was checked in August 2018 was 8.4  Plan: -***Colchicine - Continue allopurinol - Follow-up uric acid  Past Medical History:  Diagnosis Date  . Anemia 10/18/2014   Baseline about 12 and stable from 2010 to 2016. Colon 2009 in [place] (records cannot be obtained).  EGD Dr Benson Norway 2011 nl   . Aortic atherosclerosis ([place]) 03/28/2020   Incidental finding on imaging CT   . Atherosclerosis 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 ([place]) 12/28/2014   ABI Feb 2017 R 0.49; L 0.72 with diffuse dz ABI Aug 2017 R 0.49; L 0.69 ABI Jan 2018 R 0.61; L 0.73  ABI Feb 2019 R 0.55; L 0.71 Sees Dr Bridgett Larsson - recs ABI q 6 months  . Chronic diastolic heart failure secondary to hypertrophic  cardiomyopathy ([place]) 10/18/2014   Noted ECHO 10/2014. Grade 2. EF 50-55%; echo repeated 2019, severe hypertrophy with elevated filling pressures  . Chronic kidney disease, stage 3b ([place]) 03/09/2020   Dr. Justin Mend  Nephrologist, f/u Q 57M  . Coronary artery disease without angina pectoris 04/04/2018  . Degloving injury of finger 03/28/2020   10/21/19: copied from op note "1.  Open reduction percutaneous pinning left small finger proximal phalanx fracture 2.  Complex repair of laceration to left small finger 3 cm in length 3.  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 No significant callus formation is noted. Pins had been removed  . Diverticulosis 10/08/2014   Seen on CT. Reportedly on Colon in [place] in 2009. Freq bouts of diverticulitis.  . DM (diabetes mellitus), type 2 with renal complications ([place]) 08/14/7410  . Former tobacco use 10/08/2014  . Gout   . 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 Stable from 2010 to 2016.  Negative SPEP and UPEP 2014 after ARF 2/2 continued ACE (lisinopril 20) use while vol contracted. Dr Justin Mend  . Obesity (BMI 30.0-34.9) 03/09/2020  . 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 OSA (obstructive sleep apnea) 10/08/2014   July 2016 : Severe OSA/hypopnea syndrome, AHI 128.4, O2 nadir of 84% RA. Failed CPAP on study. BiPA inspiratory pressure of 21 and expiratory pressure of 17 CWP. Consider ENT evaluation for potentially  correctable upper airway obstruction contributing to the need for unusually high pressures - ENT July 2015 did not feel any intervention surgically was indicated. He wore a large F&P Simplus fullfac  . Personal history of colonic polyps 03/28/2014   Dr. Benson Norway, 2 polyps removed 2015.  No polyps on f/u in 2020.  No further surveillance suggested per Dr. Benson Norway.  Marland Kitchen Refractory obstruction of nasal airway 04/27/2020   Chronic problem,  evaluated by ENT in the past, with recommendation for surgery which she has been hesitant to consider.  He is unable to breathe through his nose comfortably.  This is part of the reason why he does not wear his oxygen regularly.  Nasal passages are nearly completely obstructed erythematous smooth glistening surface.  He has been told by his eye doctor that part of the reason his e  . Resistant hypertension 10/09/2014   Poor control with 6 drug therapy. 2016 : Aldo 37 but ARR 23.5   . Severe pulmonary arterial systolic hypertension ([place]) 10/18/2014   Noted as severe ECHO 2016 and 2019. Likely 2/2 severe untreated OSA. Pt is not adherent to CPAP.  Marland Kitchen Thrombocytopenia ([place]) 10/09/2016   Nl liver and spleen on [place] 2019  . Ulcer aphthous oral 04/27/2020   Evaluated emergency room last week, saw with Magic mouthwash   Review of Systems:  ***  Physical Exam:  There were no vitals filed for this visit. ***  Assessment & Plan:   See Encounters Tab for problem based charting.  Patient {GC/GE:[id number]::"discussed with","seen with"} Dr. {NAMES:[id number]::"Butcher","Guilloud","Hoffman","Mullen","Narendra","Raines","Vincent"}

## 2020-12-11 ENCOUNTER — Other Ambulatory Visit: Payer: Self-pay | Admitting: *Deleted

## 2020-12-11 DIAGNOSIS — E1122 Type 2 diabetes mellitus with diabetic chronic kidney disease: Secondary | ICD-10-CM | POA: Diagnosis not present

## 2020-12-11 DIAGNOSIS — N1832 Chronic kidney disease, stage 3b: Secondary | ICD-10-CM | POA: Diagnosis not present

## 2020-12-11 DIAGNOSIS — E1165 Type 2 diabetes mellitus with hyperglycemia: Secondary | ICD-10-CM | POA: Diagnosis not present

## 2020-12-11 DIAGNOSIS — I130 Hypertensive heart and chronic kidney disease with heart failure and stage 1 through stage 4 chronic kidney disease, or unspecified chronic kidney disease: Secondary | ICD-10-CM | POA: Diagnosis not present

## 2020-12-11 DIAGNOSIS — K625 Hemorrhage of anus and rectum: Secondary | ICD-10-CM | POA: Diagnosis not present

## 2020-12-11 DIAGNOSIS — I5032 Chronic diastolic (congestive) heart failure: Secondary | ICD-10-CM | POA: Diagnosis not present

## 2020-12-11 DIAGNOSIS — D631 Anemia in chronic kidney disease: Secondary | ICD-10-CM | POA: Diagnosis not present

## 2020-12-11 DIAGNOSIS — K5730 Diverticulosis of large intestine without perforation or abscess without bleeding: Secondary | ICD-10-CM | POA: Diagnosis not present

## 2020-12-11 MED ORDER — ALLOPURINOL 100 MG PO TABS: 100.0000 mg | ORAL_TABLET | Freq: Every day | ORAL | 3 refills | Status: DC

## 2020-12-14 ENCOUNTER — Other Ambulatory Visit: Payer: Self-pay

## 2020-12-14 ENCOUNTER — Ambulatory Visit (INDEPENDENT_AMBULATORY_CARE_PROVIDER_SITE_OTHER): Payer: Medicare Other | Admitting: Internal Medicine

## 2020-12-14 VITALS — BP 169/76 | HR 92 | Temp 98.4°F | Ht 66.0 in | Wt 164.6 lb

## 2020-12-14 DIAGNOSIS — Z794 Long term (current) use of insulin: Secondary | ICD-10-CM

## 2020-12-14 DIAGNOSIS — E1165 Type 2 diabetes mellitus with hyperglycemia: Secondary | ICD-10-CM

## 2020-12-14 LAB — POCT GLYCOSYLATED HEMOGLOBIN (HGB A1C): Hemoglobin A1C: 6.4 % — AB (ref 4.0–5.6)

## 2020-12-14 LAB — GLUCOSE, CAPILLARY: Glucose-Capillary: 122 mg/dL — ABNORMAL HIGH (ref 70–99)

## 2020-12-14 NOTE — Patient Instructions (Signed)
Dylan [name], We discussed your gout today, which you're experiencing in your left foot, ankle, and knee.  YOu explained that you haven't been able to get the allopurinol, which I'll check into.  I will speak to Dr. Justin Mend about what medicines to use for your gout and for your blood pressure.  I asked you to stop your minoxidil due to your dizzy spells.  You had not taken your blood pressure medicines today.  I'll be calling you after I speak to Dr. Justin Mend with new recommendations.  Your Trulicity is causing the weight loss.  The weight loss means you need less medicines for your diabetes and for your blood pressure, so that is a good thing.  We don't want you to lose any more, however.  Let's keep an eye on this.   I hope you feel better soon.  [name]

## 2020-12-15 DIAGNOSIS — I130 Hypertensive heart and chronic kidney disease with heart failure and stage 1 through stage 4 chronic kidney disease, or unspecified chronic kidney disease: Secondary | ICD-10-CM | POA: Diagnosis not present

## 2020-12-15 DIAGNOSIS — N1832 Chronic kidney disease, stage 3b: Secondary | ICD-10-CM | POA: Diagnosis not present

## 2020-12-15 DIAGNOSIS — E1165 Type 2 diabetes mellitus with hyperglycemia: Secondary | ICD-10-CM | POA: Diagnosis not present

## 2020-12-15 DIAGNOSIS — D631 Anemia in chronic kidney disease: Secondary | ICD-10-CM | POA: Diagnosis not present

## 2020-12-15 DIAGNOSIS — I5032 Chronic diastolic (congestive) heart failure: Secondary | ICD-10-CM | POA: Diagnosis not present

## 2020-12-15 DIAGNOSIS — E1122 Type 2 diabetes mellitus with diabetic chronic kidney disease: Secondary | ICD-10-CM | POA: Diagnosis not present

## 2020-12-25 DIAGNOSIS — D631 Anemia in chronic kidney disease: Secondary | ICD-10-CM | POA: Diagnosis not present

## 2020-12-25 DIAGNOSIS — E1122 Type 2 diabetes mellitus with diabetic chronic kidney disease: Secondary | ICD-10-CM | POA: Diagnosis not present

## 2020-12-25 DIAGNOSIS — I5032 Chronic diastolic (congestive) heart failure: Secondary | ICD-10-CM | POA: Diagnosis not present

## 2020-12-25 DIAGNOSIS — N1832 Chronic kidney disease, stage 3b: Secondary | ICD-10-CM | POA: Diagnosis not present

## 2020-12-25 DIAGNOSIS — I130 Hypertensive heart and chronic kidney disease with heart failure and stage 1 through stage 4 chronic kidney disease, or unspecified chronic kidney disease: Secondary | ICD-10-CM | POA: Diagnosis not present

## 2020-12-25 DIAGNOSIS — E1165 Type 2 diabetes mellitus with hyperglycemia: Secondary | ICD-10-CM | POA: Diagnosis not present

## 2020-12-28 ENCOUNTER — Ambulatory Visit (HOSPITAL_COMMUNITY): Admission: RE | Admit: 2020-12-28 | Payer: Medicare Other | Source: Ambulatory Visit

## 2020-12-31 ENCOUNTER — Other Ambulatory Visit: Payer: Self-pay

## 2020-12-31 ENCOUNTER — Inpatient Hospital Stay (HOSPITAL_COMMUNITY)
Admission: EM | Admit: 2020-12-31 | Discharge: 2021-01-02 | DRG: 312 | Disposition: A | Discharge status: Home / Self Care | Payer: Medicare Other | Attending: Student in an Organized Health Care Education/Training Program | Admitting: Student in an Organized Health Care Education/Training Program

## 2020-12-31 ENCOUNTER — Encounter (HOSPITAL_COMMUNITY): Payer: Self-pay | Admitting: Emergency Medicine

## 2020-12-31 ENCOUNTER — Emergency Department (HOSPITAL_COMMUNITY): Payer: Medicare Other

## 2020-12-31 DIAGNOSIS — E1121 Type 2 diabetes mellitus with diabetic nephropathy: Secondary | ICD-10-CM | POA: Diagnosis present

## 2020-12-31 DIAGNOSIS — I130 Hypertensive heart and chronic kidney disease with heart failure and stage 1 through stage 4 chronic kidney disease, or unspecified chronic kidney disease: Secondary | ICD-10-CM | POA: Diagnosis present

## 2020-12-31 DIAGNOSIS — Z79899 Other long term (current) drug therapy: Secondary | ICD-10-CM

## 2020-12-31 DIAGNOSIS — R059 Cough, unspecified: Secondary | ICD-10-CM | POA: Diagnosis not present

## 2020-12-31 DIAGNOSIS — E1129 Type 2 diabetes mellitus with other diabetic kidney complication: Secondary | ICD-10-CM | POA: Diagnosis present

## 2020-12-31 DIAGNOSIS — N1832 Chronic kidney disease, stage 3b: Secondary | ICD-10-CM | POA: Diagnosis present

## 2020-12-31 DIAGNOSIS — Z9119 Patient's noncompliance with other medical treatment and regimen: Secondary | ICD-10-CM

## 2020-12-31 DIAGNOSIS — Z794 Long term (current) use of insulin: Secondary | ICD-10-CM

## 2020-12-31 DIAGNOSIS — D72829 Elevated white blood cell count, unspecified: Secondary | ICD-10-CM | POA: Diagnosis present

## 2020-12-31 DIAGNOSIS — Z20822 Contact with and (suspected) exposure to covid-19: Secondary | ICD-10-CM | POA: Diagnosis present

## 2020-12-31 DIAGNOSIS — R55 Syncope and collapse: Secondary | ICD-10-CM

## 2020-12-31 DIAGNOSIS — I10 Essential (primary) hypertension: Secondary | ICD-10-CM | POA: Diagnosis not present

## 2020-12-31 DIAGNOSIS — E1122 Type 2 diabetes mellitus with diabetic chronic kidney disease: Secondary | ICD-10-CM | POA: Diagnosis present

## 2020-12-31 DIAGNOSIS — N1831 Chronic kidney disease, stage 3a: Secondary | ICD-10-CM | POA: Diagnosis present

## 2020-12-31 DIAGNOSIS — I422 Other hypertrophic cardiomyopathy: Secondary | ICD-10-CM | POA: Diagnosis not present

## 2020-12-31 DIAGNOSIS — I700 Atherosclerosis of aorta: Secondary | ICD-10-CM | POA: Diagnosis not present

## 2020-12-31 DIAGNOSIS — R634 Abnormal weight loss: Secondary | ICD-10-CM | POA: Diagnosis present

## 2020-12-31 DIAGNOSIS — E440 Moderate protein-calorie malnutrition: Secondary | ICD-10-CM | POA: Insufficient documentation

## 2020-12-31 DIAGNOSIS — Z7982 Long term (current) use of aspirin: Secondary | ICD-10-CM

## 2020-12-31 DIAGNOSIS — I951 Orthostatic hypotension: Principal | ICD-10-CM | POA: Diagnosis present

## 2020-12-31 DIAGNOSIS — T383X5A Adverse effect of insulin and oral hypoglycemic [antidiabetic] drugs, initial encounter: Secondary | ICD-10-CM | POA: Diagnosis present

## 2020-12-31 DIAGNOSIS — R42 Dizziness and giddiness: Secondary | ICD-10-CM | POA: Diagnosis not present

## 2020-12-31 DIAGNOSIS — M533 Sacrococcygeal disorders, not elsewhere classified: Secondary | ICD-10-CM | POA: Diagnosis not present

## 2020-12-31 DIAGNOSIS — E43 Unspecified severe protein-calorie malnutrition: Secondary | ICD-10-CM | POA: Insufficient documentation

## 2020-12-31 DIAGNOSIS — Z8249 Family history of ischemic heart disease and other diseases of the circulatory system: Secondary | ICD-10-CM

## 2020-12-31 DIAGNOSIS — I498 Other specified cardiac arrhythmias: Secondary | ICD-10-CM | POA: Diagnosis present

## 2020-12-31 DIAGNOSIS — I1A0 Resistant hypertension: Secondary | ICD-10-CM | POA: Diagnosis present

## 2020-12-31 DIAGNOSIS — Z8719 Personal history of other diseases of the digestive system: Secondary | ICD-10-CM

## 2020-12-31 DIAGNOSIS — I5032 Chronic diastolic (congestive) heart failure: Secondary | ICD-10-CM | POA: Diagnosis present

## 2020-12-31 DIAGNOSIS — M6284 Sarcopenia: Secondary | ICD-10-CM | POA: Diagnosis present

## 2020-12-31 DIAGNOSIS — G4733 Obstructive sleep apnea (adult) (pediatric): Secondary | ICD-10-CM | POA: Diagnosis present

## 2020-12-31 DIAGNOSIS — I70209 Unspecified atherosclerosis of native arteries of extremities, unspecified extremity: Secondary | ICD-10-CM | POA: Diagnosis present

## 2020-12-31 DIAGNOSIS — I2721 Secondary pulmonary arterial hypertension: Secondary | ICD-10-CM | POA: Diagnosis present

## 2020-12-31 DIAGNOSIS — Z6824 Body mass index (BMI) 24.0-24.9, adult: Secondary | ICD-10-CM

## 2020-12-31 DIAGNOSIS — M47816 Spondylosis without myelopathy or radiculopathy, lumbar region: Secondary | ICD-10-CM | POA: Diagnosis not present

## 2020-12-31 DIAGNOSIS — I129 Hypertensive chronic kidney disease with stage 1 through stage 4 chronic kidney disease, or unspecified chronic kidney disease: Secondary | ICD-10-CM | POA: Diagnosis present

## 2020-12-31 DIAGNOSIS — Z87891 Personal history of nicotine dependence: Secondary | ICD-10-CM

## 2020-12-31 DIAGNOSIS — I2510 Atherosclerotic heart disease of native coronary artery without angina pectoris: Secondary | ICD-10-CM | POA: Diagnosis present

## 2020-12-31 DIAGNOSIS — N183 Chronic kidney disease, stage 3 unspecified: Secondary | ICD-10-CM | POA: Diagnosis present

## 2020-12-31 DIAGNOSIS — M109 Gout, unspecified: Secondary | ICD-10-CM | POA: Diagnosis present

## 2020-12-31 HISTORY — DX: Syncope and collapse: R55

## 2020-12-31 LAB — URINALYSIS, ROUTINE W REFLEX MICROSCOPIC
Bilirubin Urine: NEGATIVE
Glucose, UA: NEGATIVE mg/dL
Hgb urine dipstick: NEGATIVE
Ketones, ur: NEGATIVE mg/dL
Leukocytes,Ua: NEGATIVE
Nitrite: NEGATIVE
Protein, ur: 30 mg/dL — AB
Specific Gravity, Urine: 1.017 (ref 1.005–1.030)
pH: 5 (ref 5.0–8.0)

## 2020-12-31 LAB — BASIC METABOLIC PANEL
Anion gap: 11 (ref 5–15)
BUN: 28 mg/dL — ABNORMAL HIGH (ref 8–23)
CO2: 26 mmol/L (ref 22–32)
Calcium: 9.3 mg/dL (ref 8.9–10.3)
Chloride: 98 mmol/L (ref 98–111)
Creatinine, Ser: 1.44 mg/dL — ABNORMAL HIGH (ref 0.61–1.24)
GFR, Estimated: 50 mL/min — ABNORMAL LOW (ref >60)
Glucose, Bld: 233 mg/dL — ABNORMAL HIGH (ref 70–99)
Potassium: 3.2 mmol/L — ABNORMAL LOW (ref 3.5–5.1)
Sodium: 135 mmol/L (ref 135–145)

## 2020-12-31 LAB — HEPATIC FUNCTION PANEL
ALT: 20 U/L (ref 0–44)
AST: 24 U/L (ref 15–41)
Albumin: 2.8 g/dL — ABNORMAL LOW (ref 3.5–5.0)
Alkaline Phosphatase: 67 U/L (ref 38–126)
Bilirubin, Direct: 0.2 mg/dL (ref 0.0–0.2)
Indirect Bilirubin: 0.6 mg/dL (ref 0.3–0.9)
Total Bilirubin: 0.8 mg/dL (ref 0.3–1.2)
Total Protein: 7.7 g/dL (ref 6.5–8.1)

## 2020-12-31 LAB — CBC
HCT: 40.2 % (ref 39.0–52.0)
Hemoglobin: 13.1 g/dL (ref 13.0–17.0)
MCH: 29.4 pg (ref 26.0–34.0)
MCHC: 32.6 g/dL (ref 30.0–36.0)
MCV: 90.3 fL (ref 80.0–100.0)
Platelets: 346 10*3/uL (ref 150–400)
RBC: 4.45 MIL/uL (ref 4.22–5.81)
RDW: 14.4 % (ref 11.5–15.5)
WBC: 11 10*3/uL — ABNORMAL HIGH (ref 4.0–10.5)
nRBC: 0 % (ref 0.0–0.2)

## 2020-12-31 LAB — CBG MONITORING, ED: Glucose-Capillary: 272 mg/dL — ABNORMAL HIGH (ref 70–99)

## 2020-12-31 LAB — T4, FREE: Free T4: 1.49 ng/dL — ABNORMAL HIGH (ref 0.61–1.12)

## 2020-12-31 LAB — TROPONIN I (HIGH SENSITIVITY)
Troponin I (High Sensitivity): 27 ng/L — ABNORMAL HIGH (ref <18)
Troponin I (High Sensitivity): 27 ng/L — ABNORMAL HIGH (ref <18)

## 2020-12-31 LAB — TSH: TSH: 0.677 u[IU]/mL (ref 0.350–4.500)

## 2020-12-31 LAB — GLUCOSE, CAPILLARY: Glucose-Capillary: 193 mg/dL — ABNORMAL HIGH (ref 70–99)

## 2020-12-31 LAB — MAGNESIUM: Magnesium: 2 mg/dL (ref 1.7–2.4)

## 2020-12-31 LAB — BRAIN NATRIURETIC PEPTIDE: B Natriuretic Peptide: 855.9 pg/mL — ABNORMAL HIGH (ref 0.0–100.0)

## 2020-12-31 MED ORDER — VERAPAMIL HCL ER 360 MG PO CP24: 360.0000 mg | ORAL_CAPSULE | Freq: Every day | ORAL | Status: DC

## 2020-12-31 MED ORDER — INSULIN ASPART 100 UNIT/ML IJ SOLN
0.0000 [IU] | Freq: Three times a day (TID) | INTRAMUSCULAR | Status: DC
  Administered 2021-01-01: 3 [IU] via SUBCUTANEOUS
  Administered 2021-01-01: 2 [IU] via SUBCUTANEOUS
  Administered 2021-01-01: 3 [IU] via SUBCUTANEOUS
  Administered 2021-01-02 (×2): 2 [IU] via SUBCUTANEOUS

## 2020-12-31 MED ORDER — ASPIRIN EC 81 MG PO TBEC
81.0000 mg | DELAYED_RELEASE_TABLET | Freq: Every day | ORAL | Status: DC
  Administered 2021-01-01 – 2021-01-02 (×2): 81 mg via ORAL
  Filled 2020-12-31 (×2): qty 1

## 2020-12-31 MED ORDER — POTASSIUM CHLORIDE CRYS ER 20 MEQ PO TBCR
40.0000 meq | EXTENDED_RELEASE_TABLET | Freq: Once | ORAL | Status: AC
  Administered 2020-12-31: 40 meq via ORAL
  Filled 2020-12-31: qty 2

## 2020-12-31 MED ORDER — SODIUM CHLORIDE 0.9% FLUSH
3.0000 mL | Freq: Once | INTRAVENOUS | Status: AC
  Administered 2020-12-31: 3 mL via INTRAVENOUS

## 2020-12-31 MED ORDER — ENSURE ENLIVE PO LIQD
237.0000 mL | Freq: Two times a day (BID) | ORAL | Status: DC
  Administered 2021-01-01 (×2): 237 mL via ORAL

## 2020-12-31 MED ORDER — ALLOPURINOL 100 MG PO TABS
100.0000 mg | ORAL_TABLET | Freq: Every day | ORAL | Status: DC
  Administered 2021-01-01 – 2021-01-02 (×2): 100 mg via ORAL
  Filled 2020-12-31 (×2): qty 1

## 2020-12-31 MED ORDER — ATORVASTATIN CALCIUM 40 MG PO TABS
40.0000 mg | ORAL_TABLET | Freq: Every day | ORAL | Status: DC
  Administered 2021-01-01 – 2021-01-02 (×2): 40 mg via ORAL
  Filled 2020-12-31 (×2): qty 1

## 2020-12-31 MED ORDER — VERAPAMIL HCL ER 180 MG PO TBCR
360.0000 mg | EXTENDED_RELEASE_TABLET | Freq: Every day | ORAL | Status: DC
  Administered 2020-12-31 – 2021-01-02 (×3): 360 mg via ORAL
  Filled 2020-12-31 (×4): qty 2

## 2020-12-31 MED ORDER — ENOXAPARIN SODIUM 40 MG/0.4ML IJ SOSY
40.0000 mg | PREFILLED_SYRINGE | INTRAMUSCULAR | Status: DC
  Administered 2020-12-31 – 2021-01-01 (×2): 40 mg via SUBCUTANEOUS
  Filled 2020-12-31 (×2): qty 0.4

## 2020-12-31 NOTE — ED Triage Notes (Signed)
Pt reports episodes of hypotension when he sits up for several months.  Seen by PCP for same.  Also reports intermittent dizziness.  Family reports syncopal episode on Friday after feeling dizzy.  States pt hit his head and had + LOC.  Also reports R elbow pain from fall.  No blood thinners.  No arm drift.  Denies numbness/weakness.

## 2020-12-31 NOTE — ED Provider Notes (Signed)
[facility] EMERGENCY DEPARTMENT Provider Note   CSN: [id number] Arrival date & time: 12/31/20  1251     History No chief complaint on file.   Dylan [name] is a 75 y.o. male.  The history is provided by the patient and medical records. No language interpreter was used.  Loss of Consciousness Episode history:  Multiple Most recent episode:  Today Timing:  Intermittent Progression:  Waxing and waning Chronicity:  New Context: normal activity and standing up   Witnessed: yes   Relieved by:  Nothing Worsened by:  Nothing Ineffective treatments:  None tried Associated symptoms: no chest pain, no confusion, no difficulty breathing, no dizziness, no fever, no focal weakness, no headaches, no malaise/fatigue, no nausea, no palpitations, no recent injury, no seizures, no shortness of breath, no vomiting and no weakness       Past Medical History:  Diagnosis Date   Anemia 10/18/2014   Baseline about 12 and stable from 2010 to 2016. Colon 2009 in [place] (records cannot be obtained).  EGD Dr Benson Norway 2011 nl    Aortic atherosclerosis ([place]) 03/28/2020   Incidental finding on imaging CT    Atherosclerosis 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 ([place]) 12/28/2014   ABI Feb 2017 R 0.49; L 0.72 with diffuse dz ABI Aug 2017 R 0.49; L 0.69 ABI Jan 2018 R 0.61; L 0.73  ABI Feb 2019 R 0.55; L 0.71 Sees Dr Bridgett Larsson - recs ABI q 6 months   Chronic diastolic 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 ([place]) 10/18/2014   Noted ECHO 10/2014. Grade 2. EF 50-55%; echo repeated 2019, severe hypertrophy with elevated filling pressures   Chronic kidney disease, stage 3b ([place]) 03/09/2020   Dr. Justin Mend  Nephrologist, f/u Q 51M   Coronary artery disease without angina pectoris 04/04/2018   Degloving injury of finger 03/28/2020   10/21/19: copied from op note "1.  Open reduction percutaneous pinning left small finger proximal phalanx fracture 2.  Complex repair of laceration to left  small finger 3 cm in length 3.  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 No significant callus formation is noted. Pins had been removed   Diverticulosis 10/08/2014   Seen on CT. Reportedly on Colon in [place] in 2009. Freq bouts of diverticulitis.   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 Stable from 2010 to 2016.  Negative SPEP and UPEP 2014 after ARF 2/2 continued ACE (lisinopril 20) use while vol contracted. 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 Failed CPAP on study. BiPA inspiratory pressure of 21 and expiratory pressure of 17 CWP. Consider ENT evaluation for potentially correctable upper airway obstruction contributing to the need for unusually high pressures - ENT July 2015 did not feel any intervention surgically was indicated. He wore a large F&P Simplus fullfac   Personal history of colonic polyps 03/28/2014   Dr. Benson Norway, 2 polyps removed 2015.  No polyps on f/u in 2020.  No further surveillance suggested per Dr. Benson Norway.   Refractory obstruction of nasal airway 04/27/2020   Chronic problem,  evaluated by ENT in the past, with recommendation for surgery which she has been hesitant to consider.  He is unable to breathe through his nose comfortably.  This is part of the reason why he does not wear his oxygen regularly.  Nasal passages are nearly completely obstructed erythematous smooth glistening surface.  He has been told by his eye doctor that part of the reason his e   Resistant hypertension 10/09/2014   Poor control with 6 drug therapy. 2016 : Aldo 37 but ARR 23.5    Severe pulmonary  arterial systolic hypertension ([place]) 10/18/2014   Noted as severe ECHO 2016 and 2019. Likely 2/2 severe untreated OSA. Pt is not adherent to CPAP.   Thrombocytopenia ([place]) 10/09/2016   Nl liver and spleen on [place] 2019   Ulcer aphthous oral 04/27/2020   Evaluated emergency room last week, saw with Magic mouthwash    Patient Active Problem List   Diagnosis Date Noted   Unintentional weight loss 11/28/2020   Orthostatic hypotension 11/28/2020   AV block, 1st degree 11/23/2020   Bradycardia 11/23/2020   RBBB 11/23/2020   GI bleed 11/21/2020   Type 2 diabetes mellitus with hyperglycemia ([place]) 11/21/2020   History of BPH 09/14/2020   Exertional dyspnea 09/14/2020   Healthcare maintenance 09/13/2020   Urge urinary incontinence 06/29/2020   Refractory obstruction of nasal airway 04/27/2020   Aortic atherosclerosis ([place]) 03/28/2020   Obesity (BMI 30.0-34.9) 03/09/2020   Stage 3b chronic kidney disease ([place]) 03/09/2020   Coronary artery disease without angina pectoris 04/04/2018   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   Atherosclerosis 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 ([place]) 12/28/2014   Gout 10/19/2014   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 ([place]) 10/18/2014   Severe pulmonary arterial systolic hypertension ([place]) 10/18/2014   Chronic diastolic 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 ([place]) 10/18/2014   Resistant hypertension 10/09/2014   OSA (obstructive sleep apnea) 10/08/2014   Former tobacco use 10/08/2014   Personal history of colonic polyps 03/28/2014   DM (diabetes mellitus), type 2 with renal complications ([place]) 50/53/9767    Past Surgical History:  Procedure Laterality Date   CHOLECYSTECTOMY     COLONOSCOPY WITH PROPOFOL N/A 02/12/2019   Procedure: COLONOSCOPY WITH PROPOFOL;  Surgeon: Carol Ada, MD;  Location: [facility];  Service: Endoscopy;  Laterality: N/A;   PERCUTANEOUS PINNING Left 10/21/2019   Procedure: CLOSED  REDUCTION WITH PERCUTANEOUS PINNING AND SUTURE REPAIR OF LACERATION  OF SMALL FINGER,  SUTURE REPAIR OF LACERATION OF INDEX FINGER;  Surgeon: Cindra Presume, MD;  Location: [facility];  Service: Plastics;  Laterality: Left;       Family History  Problem Relation Age of Onset   Heart failure Sister        Died of complications Nov 3419   CAD Sister        CABG x3 while in her 63's    Social History   Tobacco Use   Smoking status: Former    Pack years: 0.00    Types: Cigarettes    Quit date: 10/02/1972    Years since quitting: 48.2   Smokeless tobacco: Never  Vaping Use   Vaping Use: Never used  Substance Use Topics   Alcohol use: No    Alcohol/week: 0.0 standard drinks   Drug use: No    Home Medications Prior to Admission medications   Medication Sig Start Date End Date Taking? Authorizing Provider  allopurinol (ZYLOPRIM) 100 MG tablet Take 1 tablet (100  mg total) by mouth daily. 12/11/20   Angelica Pou, MD  aspirin 81 MG EC tablet Take 81 mg by mouth daily. Swallow whole.    [provider]  atorvastatin (LIPITOR) 40 MG tablet Take 1 tablet (40 mg total) by mouth daily. 05/06/19   Bartholomew Crews, MD  carboxymethylcellulose (REFRESH PLUS) 0.5 % SOLN Place 1 drop into both eyes 3 (three) times daily as needed (dry eyes).    [provider]  cycloSPORINE (RESTASIS) 0.05 % ophthalmic emulsion Place 1 drop into both eyes 2 (two) times daily. Patient not taking: Reported on 11/22/2020 09/14/20 09/14/21  Angelica Pou, MD  eplerenone (INSPRA) 50 MG tablet Take 1 tablet (50 mg total) by mouth 2 (two) times daily. 11/28/20   Virl Axe, MD  FREESTYLE LITE test strip Use to check blood sugar 3 times daily, DIAG CODE E11.29, INSULIN DEPENDENT Patient taking differently: 1 each by Other route in the morning, at noon, and at bedtime. 05/19/20   Angelica Pou, MD  hydrALAZINE (APRESOLINE) 50 MG tablet Take 2 tablets (100 mg total) by mouth 3 (three)  times daily. 11/28/20   Virl Axe, MD  Insulin Pen Needle (PEN NEEDLES) 31G X 5 MM MISC Inject 1 Dose into the skin daily. With Victoza DIAG CODE E11.29. INSULIN DEPENDENT 07/12/19   Bartholomew Crews, MD  labetalol (NORMODYNE) 200 MG tablet Take 1 tablet (200 mg total) by mouth daily. 12/01/20   Jose Persia, MD  pantoprazole (PROTONIX) 40 MG tablet Take 1 tablet (40 mg total) by mouth 2 (two) times daily for 14 days, THEN 1 tablet (40 mg total) daily for 14 days. 11/23/20 12/21/20  Sanjuan Dame, MD  polyethylene glycol (MIRALAX / Floria Raveling) packet Take 17 g by mouth daily as needed (constipation).     [provider]  tamsulosin (FLOMAX) 0.4 MG CAPS capsule Take 0.4 mg by mouth daily. 10/26/20   [provider]  verapamil (VERELAN PM) 360 MG 24 hr capsule Take 1 capsule (360 mg total) by mouth daily. 06/29/20   Angelica Pou, MD    Allergies    Ace inhibitors and Spironolactone  Review of Systems   Review of Systems  Constitutional:  Positive for fatigue. Negative for chills, fever and malaise/fatigue.  HENT:  Negative for congestion.   Respiratory:  Negative for cough, chest tightness, shortness of breath and wheezing.   Cardiovascular:  Positive for syncope. Negative for chest pain and palpitations.  Gastrointestinal:  Negative for abdominal pain, constipation, diarrhea, nausea and vomiting.  Genitourinary:  Negative for flank pain and frequency.  Musculoskeletal:  Negative for back pain.  Skin:  Negative for rash and wound.  Neurological:  Positive for syncope and light-headedness. Negative for dizziness, focal weakness, seizures, weakness and headaches.  Psychiatric/Behavioral:  Negative for agitation and confusion.   All other systems reviewed and are negative.  Physical Exam Updated Vital Signs BP (!) 151/102 (BP Location: Left Arm)   Pulse 73   Temp 97.9 F (36.6 C) (Oral)   Resp 18   SpO2 98%   Physical Exam Vitals and nursing note  reviewed.  Constitutional:      General: He is not in acute distress.    Appearance: He is well-developed. He is not ill-appearing, toxic-appearing or diaphoretic.  HENT:     Head: Normocephalic and atraumatic.     Nose: Nose normal. No congestion or rhinorrhea.     Mouth/Throat:     Mouth: Mucous membranes are moist.  Pharynx: No oropharyngeal exudate or posterior oropharyngeal erythema.  Eyes:     Conjunctiva/sclera: Conjunctivae normal.     Pupils: Pupils are equal, round, and reactive to light.  Cardiovascular:     Rate and Rhythm: Normal rate and regular rhythm.     Heart sounds: No murmur heard. Pulmonary:     Effort: Pulmonary effort is normal. No respiratory distress.     Breath sounds: Normal breath sounds. No wheezing, rhonchi or rales.  Chest:     Chest wall: No tenderness.  Abdominal:     General: Abdomen is flat.     Palpations: Abdomen is soft.     Tenderness: There is no abdominal tenderness. There is no right CVA tenderness, left CVA tenderness, guarding or rebound.  Musculoskeletal:        General: No tenderness.     Cervical back: Neck supple. No tenderness.     Right lower leg: No edema.     Left lower leg: No edema.  Skin:    General: Skin is warm and dry.     Capillary Refill: Capillary refill takes less than 2 seconds.     Findings: No erythema.  Neurological:     General: No focal deficit present.     Mental Status: He is alert.  Psychiatric:        Mood and Affect: Mood normal.    ED Results / Procedures / Treatments   Labs (all labs ordered are listed, but only abnormal results are displayed) Labs Reviewed  BASIC METABOLIC PANEL - Abnormal; Notable for the following components:      Result Value   Potassium 3.2 (*)    Glucose, Bld 233 (*)    BUN 28 (*)    Creatinine, Ser 1.44 (*)    GFR, Estimated 50 (*)    All other components within normal limits  CBC - Abnormal; Notable for the following components:   WBC 11.0 (*)    All other  components within normal limits  CBG MONITORING, ED - Abnormal; Notable for the following components:   Glucose-Capillary 272 (*)    All other components within normal limits  URINE CULTURE  URINALYSIS, ROUTINE W REFLEX MICROSCOPIC  HEPATIC FUNCTION PANEL  TSH  BRAIN NATRIURETIC PEPTIDE  MAGNESIUM  TROPONIN I (HIGH SENSITIVITY)    EKG EKG Interpretation  Date/Time:  Sunday December 31 2020 12:58:35 EDT Ventricular Rate:  114 PR Interval:  164 QRS Duration: 138 QT Interval:  406 QTC Calculation: 559 R Axis:   -68 Text Interpretation: Sinus tachycardia Right bundle branch block Left anterior fascicular block Minimal voltage criteria for LVH, may be normal variant ( R in aVL ) T wave abnormality, consider lateral ischemia Abnormal ECG rate increased but QRS morphology similar to previous Confirmed by Charlesetta Shanks [phone]) on 12/31/2020 1:08:14 PM  Radiology CT HEAD WO CONTRAST  Result Date: 12/31/2020 CLINICAL DATA:  Syncope, dizziness EXAM: CT HEAD WITHOUT CONTRAST TECHNIQUE: Contiguous axial images were obtained from the base of the skull through the vertex without intravenous contrast. COMPARISON:  03/21/2017 FINDINGS: Brain: No evidence of acute infarction, hemorrhage, hydrocephalus, extra-axial collection or mass lesion/mass effect. Periventricular white matter hypodensity. Vascular: No hyperdense vessel or unexpected calcification. Skull: Normal. Negative for fracture or focal lesion. Sinuses/Orbits: No acute finding. Other: None. IMPRESSION: No acute intracranial pathology. Small-vessel white matter disease. Electronically Signed   By: Eddie Candle M.D.   On: 12/31/2020 14:26    Procedures Procedures   Medications Ordered in ED Medications  sodium chloride flush (NS) 0.9 % injection 3 mL (3 mLs Intravenous Given 12/31/20 1353)    ED Course  I have reviewed the triage vital signs and the nursing notes.  Pertinent labs & imaging results that were available during my care of  the patient were reviewed by me and considered in my medical decision making (see chart for details).    MDM Rules/Calculators/A&P                          Dylan [name] is a 75 y.o. male with a past medical for hypertension, diabetes, pulmonary arterial systolic hypertension, CHF, gout, CAD, CKD, diabetes,  recent GI bleed, orthostatic hypotension, and previous first-degree AV block who presents with numerous episodes of near syncope and 2 episodes of syncope with falls.  According to patient, for the last week or 2 he has been having increasing episodes of near syncope and lightheadedness especially with standing.  He however says that over the last few days it has happened even at rest while sitting including an episode today where he nearly passed out while sitting on a couch.  He reports that yesterday he stood up and had a syncopal episode and fell and hit his head as well as his elbow.  Reports elbow is doing better but he was concerned about pain that he had that was more severe in his sacrum and pelvis.  He reports that pain is improved now.  He denies any other extremity pains.  He denies any headaches or neck pain currently.  He does report still feeling fatigued and feels that he is concerned he might pass out at any point.  He denies any chest pain, palpitations, shortness of breath with it but does report he had some worsened peripheral edema recently.  He denies nausea, vomiting, or diarrhea.  On exam, lungs are clear and chest is nontender.  Abdomen is nontender.  Legs do not have significant edema for me but patient does feel they are slightly more swollen than baseline.  Back was nontender, neck was nontender.  Exam otherwise unremarkable.  He did not have any tenderness in his elbow on my exam.  Had a shared decision-making conversation with patient and family.  Given the lack of tenderness on the elbow, they did not want imaging there but they did want imaging of the sacrum and pelvis.   We will get a CT of the head and also get screening labs including a chest x-ray to look for occult pneumonia given the cough or other electrolyte abnormality.  Clinically I am concerned about his multiple episodes of near syncope and syncope with history of CHF and recent mission.  Care transferred to oncoming team while waiting for results of diagnostic work-up.  Anticipate admission after.   Final Clinical Impression(s) / ED Diagnoses Final diagnoses:  Syncope    Clinical Impression: 1. Syncope     Disposition: Care transferred to oncoming team while waiting for results of diagnostic work-up.  Anticipate admission after.  This note was prepared with assistance of Systems analyst. Occasional wrong-word or sound-a-like substitutions may have occurred due to the inherent limitations of voice recognition software.     Clancey Welton, Gwenyth Allegra, MD 12/31/20 [phone]

## 2020-12-31 NOTE — ED Notes (Signed)
Yellow, green and lavender tube sent to lab

## 2020-12-31 NOTE — ED Notes (Signed)
To CT

## 2020-12-31 NOTE — Progress Notes (Signed)
Patient does not wear CPAP at home so he does not wish to wear it at night for sleep.

## 2020-12-31 NOTE — ED Provider Notes (Signed)
Patient care assumed at 1500. Patient here for evaluation following multiple syncopal episodes. Most recently he syncopal eyes while sitting on the couch. Telemetry concerning for intermittent atrial fibrillation. Plan to admit for ongoing workup.   Quintella Reichert, MD 12/31/20 1745

## 2020-12-31 NOTE — ED Notes (Signed)
Back from CT, remembering to mention tailbone pain from recent fall, Dr. Gustavus Messing in to see, EKG repeated. Family at [facility].

## 2020-12-31 NOTE — H&P (Addendum)
Date: 12/31/2020               Patient Name:  Dylan [name] MRN: [id number]  DOB: [birth year] Age / Sex: 75 y.o., male   PCP: Angelica Pou, MD         Medical Service: Internal Medicine Teaching Service         Attending Physician: Dr. Evette Doffing, Mallie Mussel, *    First Contact: Dr. Allyson Sabal Pager: [phone]  Second Contact: Dr. Charleen Kirks Pager: [phone]       After Hours (After 5p/  First Contact Pager: [phone]  weekends / holidays): Second Contact Pager: [phone]   Chief Complaint: Loss of consciousness  History of Present Illness:   Dylan [name] states he initially began having low blood pressures when going from a sitting to standing position. He visited his primary care office and his blood pressure medications were adjusted. Over the past few weeks, he began to experience dizziness more and more often and now can happen 2-3 times a day. The episodes have worsened as of a week ago and have led to him losing consciousness twice in the past week. They have begun to occur even when he is sitting or lying down. Today, he was sitting on his couch while his son was making him breakfast when all of a sudden, he became dizzy, started shaking, and eventually fainted all while remaining seated. Denies any tongue biting or urinary incontinence during the shaking episode.  He does report that sometimes he becomes warm and starts to sweat before the dizziness come on, but no chest pain or palpitations.   Dylan [name] notes weight loss over the last couple months that has been attributed to diverticulitis.   He states that he has a history of A. Fib for at least 10 years and was told he has an enlarged heart. He has never experienced palpitations, dizziness, chest pain, or SHOB previously with A. Fib.   ED Course: CBC with leukocytosis (11). BMP with K 3.2, Cr 1.44, BUN 28, albumin 2.8, Mg 2.0. TSH wnl. BNP 856. Troponins 27 > 27. CT head with no acute intracranial pathology, but showing  small vessel white matter disease. Patient noted to be in atrial fibrillation on cardiac monitoring.   Meds:  No current facility-administered medications on file prior to encounter.   Current Outpatient Medications on File Prior to Encounter  Medication Sig Dispense Refill   allopurinol (ZYLOPRIM) 100 MG tablet Take 1 tablet (100 mg total) by mouth daily. 90 tablet 3   aspirin 81 MG EC tablet Take 81 mg by mouth daily. Swallow whole.     atorvastatin (LIPITOR) 40 MG tablet Take 1 tablet (40 mg total) by mouth daily. 90 tablet 3   carboxymethylcellulose (REFRESH PLUS) 0.5 % SOLN Place 1 drop into both eyes 3 (three) times daily as needed (dry eyes).     cycloSPORINE (RESTASIS) 0.05 % ophthalmic emulsion Place 1 drop into both eyes 2 (two) times daily. 0.4 mL 1   Dulaglutide (TRULICITY) 1.5 PT/4.6FK SOPN Inject into the skin once a week. Saturdays     polyethylene glycol (MIRALAX / GLYCOLAX) packet Take 17 g by mouth daily as needed (constipation).      eplerenone (INSPRA) 50 MG tablet Take 1 tablet (50 mg total) by mouth 2 (two) times daily. (Patient not taking: No sig reported) 180 tablet 3   FREESTYLE LITE test strip Use to check blood sugar 3 times daily, DIAG CODE E11.29, INSULIN DEPENDENT (  Patient taking differently: 1 each by Other route in the morning, at noon, and at bedtime.) 300 each 1   hydrALAZINE (APRESOLINE) 50 MG tablet Take 2 tablets (100 mg total) by mouth 3 (three) times daily. (Patient not taking: No sig reported) 540 tablet 3   Insulin Pen Needle (PEN NEEDLES) 31G X 5 MM MISC Inject 1 Dose into the skin daily. With Victoza DIAG CODE E11.29. INSULIN DEPENDENT 100 each 1   labetalol (NORMODYNE) 200 MG tablet Take 1 tablet (200 mg total) by mouth daily. (Patient not taking: No sig reported) 30 tablet 0   pantoprazole (PROTONIX) 40 MG tablet Take 1 tablet (40 mg total) by mouth 2 (two) times daily for 14 days, THEN 1 tablet (40 mg total) daily for 14 days. (Patient not taking:  Reported on 12/31/2020) 42 tablet 0   verapamil (VERELAN PM) 360 MG 24 hr capsule Take 1 capsule (360 mg total) by mouth daily. (Patient not taking: No sig reported) 90 capsule 3   Allergies: Allergies as of 12/31/2020 - Review Complete 12/31/2020  Allergen Reaction Noted   Ace inhibitors Other (See Comments) 10/03/2017   Spironolactone Other (See Comments) 10/19/2014   Past Medical History:  Diagnosis Date   Anemia 10/18/2014   Baseline about 12 and stable from 2010 to 2016. Colon 2009 in [place] (records cannot be obtained).  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 Grade 2. EF 50-55%; echo repeated 2019, severe hypertrophy with elevated filling pressures   Chronic kidney disease, stage 3b ([place]) 03/09/2020   Dr. Justin Mend  Nephrologist, f/u Q 50M   Coronary artery disease without angina pectoris 04/04/2018   Degloving injury of finger 03/28/2020   10/21/19: copied from op note "1.  Open reduction percutaneous pinning left small finger proximal phalanx fracture 2.  Complex repair of laceration to left small finger 3 cm in length 3.  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 No significant callus formation is noted. Pins had been removed   Diverticulosis 10/08/2014   Seen on CT. Reportedly on Colon in [place] in 2009. Freq bouts of diverticulitis.   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 Stable from 2010 to 2016.  Negative SPEP and UPEP 2014 after ARF 2/2 continued ACE (lisinopril 20) use while vol contracted. 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 Failed CPAP on study. BiPA inspiratory pressure of 21 and expiratory pressure of 17 CWP. Consider ENT evaluation for potentially correctable upper airway obstruction contributing to the need for unusually high pressures - ENT July 2015 did not feel any  intervention surgically was indicated. He wore a large F&P Simplus fullfac   Personal history of colonic polyps 03/28/2014   Dr. Benson Norway, 2 polyps removed 2015.  No polyps on f/u in 2020.  No further surveillance suggested per Dr. Benson Norway.   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  He is unable to breathe through his nose comfortably.  This is part of the reason why he does not wear his oxygen regularly.  Nasal passages are nearly completely obstructed erythematous smooth glistening surface.  He has been told by his eye doctor that part of the reason his e   Resistant hypertension 10/09/2014   Poor control with 6 drug therapy. 2016 : Aldo 37 but ARR 23.5    Severe pulmonary arterial systolic hypertension ([place]) 10/18/2014   Noted as severe ECHO 2016 and 2019. Likely 2/2 severe untreated OSA. Pt is not adherent to CPAP.   Thrombocytopenia ([place]) 10/09/2016   Nl liver and spleen on [place] 2019   Ulcer aphthous oral 04/27/2020   Evaluated emergency room last week, saw with Magic mouthwash    Family History:  Family History  Problem Relation Age of Onset   Heart failure Sister        Died of complications Nov 7782   CAD Sister        CABG x3 while in her  38's     Social History:  Social History   Socioeconomic History   Marital status: Divorced    Spouse name: Not on file   Number of children: Not on file   Years of education: Not on file   Highest education level: Not on file  Occupational History   Not on file  Tobacco Use   Smoking status: Former    Pack years: 0.00    Types: Cigarettes    Quit date: 10/02/1972    Years since quitting: 48.2   Smokeless tobacco: Never  Vaping Use   Vaping Use: Never used  Substance and Sexual Activity   Alcohol use: No    Alcohol/week: 0.0 standard drinks   Drug use: No   Sexual activity: Never  Other Topics Concern   Not on file  Social History Narrative   Worked in Civil engineer, 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 Had yearly occupational testing inc PFT's. Now retired. Divorced. Has male sig other. Likes to go fishing.      He starting drinking as a teen and would drink to passing out. Couldn't keep job, have family. He quit drinking and smoking cold [place] with help of his faith. No ETOH since about age 38ish      Total of 7 brothers and 6 sisters.   Social Determinants of Health   Financial Resource Strain: Not on file  Food Insecurity: Not on file  Transportation Needs: Not on file  Physical Activity: Not on file  Stress: Not on file  Social Connections: Not on file  Intimate Partner Violence: Not on file     Review of Systems: A complete ROS was negative except as per HPI.   Physical Exam: Blood pressure (!) 164/118, pulse 80, temperature 97.9 F (36.6 C), temperature source Oral, resp. rate 20, height _0  (1.676 m), weight 70.3 kg, SpO2 97 %. General: well developed elderly male, lying in bed, NAD. HENT: NCAT Eyes: EOMI CV: normal rate and irregularly irregular rhythm, no m/r/g. Pulm: CTABL, no adventitious sounds noted.  Abdomen: soft, nontender, nondistended. MSK: no peripheral edema. Neuro: AAOx4, no focal deficits. Psych: anxious.  EKG: personally reviewed my interpretation is  sinus tachyarrhythmia  DG Chest 1 View  Result Date: 12/31/2020 CLINICAL DATA:  Cough and dizziness with subsequent fall. EXAM: CHEST  1 VIEW COMPARISON:  April 04, 2018 FINDINGS: The cardiac silhouette is mildly enlarged. Mild calcification of the aortic arch is noted. Both lungs are clear. The visualized skeletal structures are unremarkable. IMPRESSION: Stable exam without acute cardiopulmonary disease. Electronically Signed   By: Virgina Norfolk M.D.   On: 12/31/2020 15:55   DG Pelvis 1-2 Views  Result Date: 12/31/2020 CLINICAL DATA:  Cough and dizziness with subsequent fall. EXAM: PELVIS - 1-2 VIEW COMPARISON:  None. FINDINGS: There is no evidence of pelvic fracture or diastasis. No pelvic bone lesions are seen. Degenerative changes are noted within the visualized portion of the lower lumbar spine. IMPRESSION: No acute osseous abnormality. Electronically Signed   By: Virgina Norfolk M.D.   On: 12/31/2020 15:54   DG Sacrum/Coccyx  Result Date: 12/31/2020 CLINICAL DATA:  Cough and dizziness with subsequent fall. EXAM: SACRUM AND COCCYX - 2+ VIEW COMPARISON:  None. FINDINGS: There is no evidence of an acute fracture or other focal bone lesions. Degenerative changes are noted within the lower sacrum and lumbar spine. IMPRESSION: Degenerative changes without an acute osseous abnormality. Electronically Signed   By: Virgina Norfolk M.D.   On: 12/31/2020 15:58   CT HEAD WO CONTRAST  Result Date: 12/31/2020 CLINICAL DATA:  Syncope, dizziness EXAM: CT HEAD WITHOUT CONTRAST TECHNIQUE: Contiguous axial images were obtained from the base of the skull through the vertex without intravenous contrast. COMPARISON:  03/21/2017 FINDINGS: Brain: No evidence of acute infarction, hemorrhage, hydrocephalus, extra-axial collection or mass lesion/mass effect. Periventricular white matter hypodensity. Vascular: No hyperdense vessel or unexpected calcification. Skull: Normal. Negative for fracture or focal  lesion. Sinuses/Orbits: No acute finding. Other: None. IMPRESSION: No acute intracranial pathology. Small-vessel white matter disease. Electronically Signed   By: Eddie Candle M.D.   On: 12/31/2020 14:26     Assessment & Plan by Problem: Active Problems:   Syncope  Syncope Patient presenting with 2 syncopal episodes, 1 upon standing and 1 while sitting, with prodromal symptoms of feeling warm, diaphoresis, and dizziness.  Patient with history of orthostatic hypotension that was thought to be multifactorial secondary to overdiuresis with torsemide along with his home blood pressure medications of labetalol, minoxidil, hydralazine, verapamil.  His torsemide was discontinued and his blood pressure medications were decreased.  However, now patient is having syncopal episodes at rest which is very concerning. There is a concern for structural heart disease (right ventricular dysfunction) given history of severe pulmonary artery hypertension secondary to severe untreated OSA.  Patient is nonadherent to CPAP.  It is reassuring the patient's troponins are flat.  Noncontrast CT head with no acute abnormalities, but showing small vessel white matter disease. -Cardiac monitoring -Follow-up echo -Follow-up thyroid studies -Follow-up urinalysis, urine culture -Orthostatic vitals -CPAP nightly  Resistant HTN Secondary to untreated severe OSA.  Patient has nonadherent to CPAP.  Currently taking eplerenone 50 mg twice daily, hydralazine 100 mg 3 times daily, verapamil 360 mg daily.  Previously was on labetalol and minoxidil for hypertension as well but these were recently discontinued in the setting of orthostatic hypotension.  Blood pressures since arrival have been severely elevated, although patient has remained asymptomatic. -resume verapamil 349m daily -slowly reintroduce antihypertensive therapy  Chronic diastolic HF 2/2 HCM Severe PAH Severe  untreated OSA Last ECHO 08/2017 with EF 60-65% with severe  concentric hypertrophy, severe pulm htn, moderate pericardial effusion, G1DD. Previously on torsemide, but this was discontinued due to orthostatic hypotension. He is euvolemic on exam, but does note intermittent foot swelling up to ankle. Does not wear CPAP at home. -f/u ECHO -CPAP qhs  CKD Stage IIIB Baseline Cr ~1.3-1.5, with eGFR ~45. Follows [facility]. Last visit with Dr. Justin Mend on 05/2020. Thought to be 2/2 T2DM and HTN. -trend BMP -avoid nephrotoxic medications  I3HW On trulicity 8.61UO weekly, last A1c of 5.7 in 09/2020.  -SSI -CBG monitoring  Dispo: Admit patient to Observation with expected length of stay less than 2 midnights.  Signed: Virl Axe, MD 12/31/2020, 7:04 PM  Pager: [phone] After 5pm on weekdays and 1pm on weekends: On Call pager: [phone]

## 2021-01-01 ENCOUNTER — Observation Stay (HOSPITAL_COMMUNITY): Payer: Medicare Other

## 2021-01-01 DIAGNOSIS — I361 Nonrheumatic tricuspid (valve) insufficiency: Secondary | ICD-10-CM | POA: Diagnosis not present

## 2021-01-01 DIAGNOSIS — I10 Essential (primary) hypertension: Secondary | ICD-10-CM | POA: Diagnosis not present

## 2021-01-01 DIAGNOSIS — Z7982 Long term (current) use of aspirin: Secondary | ICD-10-CM | POA: Diagnosis not present

## 2021-01-01 DIAGNOSIS — Z87891 Personal history of nicotine dependence: Secondary | ICD-10-CM | POA: Diagnosis not present

## 2021-01-01 DIAGNOSIS — Z6824 Body mass index (BMI) 24.0-24.9, adult: Secondary | ICD-10-CM | POA: Diagnosis not present

## 2021-01-01 DIAGNOSIS — I700 Atherosclerosis of aorta: Secondary | ICD-10-CM | POA: Diagnosis present

## 2021-01-01 DIAGNOSIS — E43 Unspecified severe protein-calorie malnutrition: Secondary | ICD-10-CM | POA: Insufficient documentation

## 2021-01-01 DIAGNOSIS — R55 Syncope and collapse: Secondary | ICD-10-CM

## 2021-01-01 DIAGNOSIS — N400 Enlarged prostate without lower urinary tract symptoms: Secondary | ICD-10-CM | POA: Diagnosis not present

## 2021-01-01 DIAGNOSIS — I422 Other hypertrophic cardiomyopathy: Secondary | ICD-10-CM | POA: Diagnosis present

## 2021-01-01 DIAGNOSIS — I5032 Chronic diastolic (congestive) heart failure: Secondary | ICD-10-CM | POA: Diagnosis present

## 2021-01-01 DIAGNOSIS — E440 Moderate protein-calorie malnutrition: Secondary | ICD-10-CM | POA: Insufficient documentation

## 2021-01-01 DIAGNOSIS — I509 Heart failure, unspecified: Secondary | ICD-10-CM

## 2021-01-01 DIAGNOSIS — Z20822 Contact with and (suspected) exposure to covid-19: Secondary | ICD-10-CM | POA: Diagnosis present

## 2021-01-01 DIAGNOSIS — I70209 Unspecified atherosclerosis of native arteries of extremities, unspecified extremity: Secondary | ICD-10-CM | POA: Diagnosis present

## 2021-01-01 DIAGNOSIS — K5730 Diverticulosis of large intestine without perforation or abscess without bleeding: Secondary | ICD-10-CM | POA: Diagnosis not present

## 2021-01-01 DIAGNOSIS — D72829 Elevated white blood cell count, unspecified: Secondary | ICD-10-CM | POA: Diagnosis present

## 2021-01-01 DIAGNOSIS — I2510 Atherosclerotic heart disease of native coronary artery without angina pectoris: Secondary | ICD-10-CM | POA: Diagnosis present

## 2021-01-01 DIAGNOSIS — T383X5A Adverse effect of insulin and oral hypoglycemic [antidiabetic] drugs, initial encounter: Secondary | ICD-10-CM | POA: Diagnosis present

## 2021-01-01 DIAGNOSIS — Z794 Long term (current) use of insulin: Secondary | ICD-10-CM | POA: Diagnosis not present

## 2021-01-01 DIAGNOSIS — I130 Hypertensive heart and chronic kidney disease with heart failure and stage 1 through stage 4 chronic kidney disease, or unspecified chronic kidney disease: Secondary | ICD-10-CM | POA: Diagnosis present

## 2021-01-01 DIAGNOSIS — R634 Abnormal weight loss: Secondary | ICD-10-CM | POA: Diagnosis present

## 2021-01-01 DIAGNOSIS — I951 Orthostatic hypotension: Secondary | ICD-10-CM | POA: Diagnosis present

## 2021-01-01 DIAGNOSIS — Z9049 Acquired absence of other specified parts of digestive tract: Secondary | ICD-10-CM | POA: Diagnosis not present

## 2021-01-01 DIAGNOSIS — I498 Other specified cardiac arrhythmias: Secondary | ICD-10-CM | POA: Diagnosis present

## 2021-01-01 DIAGNOSIS — E1122 Type 2 diabetes mellitus with diabetic chronic kidney disease: Secondary | ICD-10-CM | POA: Diagnosis present

## 2021-01-01 DIAGNOSIS — G4733 Obstructive sleep apnea (adult) (pediatric): Secondary | ICD-10-CM | POA: Diagnosis present

## 2021-01-01 DIAGNOSIS — M109 Gout, unspecified: Secondary | ICD-10-CM | POA: Diagnosis present

## 2021-01-01 DIAGNOSIS — N1831 Chronic kidney disease, stage 3a: Secondary | ICD-10-CM | POA: Diagnosis present

## 2021-01-01 DIAGNOSIS — Z8249 Family history of ischemic heart disease and other diseases of the circulatory system: Secondary | ICD-10-CM | POA: Diagnosis not present

## 2021-01-01 DIAGNOSIS — M6284 Sarcopenia: Secondary | ICD-10-CM | POA: Diagnosis present

## 2021-01-01 DIAGNOSIS — I313 Pericardial effusion (noninflammatory): Secondary | ICD-10-CM | POA: Diagnosis not present

## 2021-01-01 DIAGNOSIS — Z8719 Personal history of other diseases of the digestive system: Secondary | ICD-10-CM | POA: Diagnosis not present

## 2021-01-01 DIAGNOSIS — I2721 Secondary pulmonary arterial hypertension: Secondary | ICD-10-CM | POA: Diagnosis present

## 2021-01-01 DIAGNOSIS — K429 Umbilical hernia without obstruction or gangrene: Secondary | ICD-10-CM | POA: Diagnosis not present

## 2021-01-01 LAB — GLUCOSE, CAPILLARY
Glucose-Capillary: 129 mg/dL — ABNORMAL HIGH (ref 70–99)
Glucose-Capillary: 159 mg/dL — ABNORMAL HIGH (ref 70–99)
Glucose-Capillary: 169 mg/dL — ABNORMAL HIGH (ref 70–99)
Glucose-Capillary: 127 mg/dL — ABNORMAL HIGH (ref 70–99)

## 2021-01-01 LAB — ECHOCARDIOGRAM COMPLETE
AV Mean grad: 3 mmHg
AV Peak grad: 5.7 mmHg
Ao pk vel: 1.19 m/s
Height: 66 in
S' Lateral: 3 cm
Weight: 2438.39 oz

## 2021-01-01 LAB — BASIC METABOLIC PANEL
Anion gap: 8 (ref 5–15)
BUN: 28 mg/dL — ABNORMAL HIGH (ref 8–23)
CO2: 26 mmol/L (ref 22–32)
Calcium: 8.9 mg/dL (ref 8.9–10.3)
Chloride: 103 mmol/L (ref 98–111)
Creatinine, Ser: 1.34 mg/dL — ABNORMAL HIGH (ref 0.61–1.24)
GFR, Estimated: 55 mL/min — ABNORMAL LOW (ref >60)
Glucose, Bld: 132 mg/dL — ABNORMAL HIGH (ref 70–99)
Potassium: 3.4 mmol/L — ABNORMAL LOW (ref 3.5–5.1)
Sodium: 137 mmol/L (ref 135–145)

## 2021-01-01 LAB — SARS CORONAVIRUS 2 (TAT 6-24 HRS): SARS Coronavirus 2: NEGATIVE

## 2021-01-01 MED ORDER — IOHEXOL 300 MG/ML  SOLN
100.0000 mL | Freq: Once | INTRAMUSCULAR | Status: AC | PRN
  Administered 2021-01-01: 100 mL via INTRAVENOUS

## 2021-01-01 MED ORDER — ADULT MULTIVITAMIN W/MINERALS CH
1.0000 | ORAL_TABLET | Freq: Every day | ORAL | Status: DC
  Administered 2021-01-01 – 2021-01-02 (×2): 1 via ORAL
  Filled 2021-01-01 (×2): qty 1

## 2021-01-01 MED ORDER — POTASSIUM CHLORIDE CRYS ER 20 MEQ PO TBCR
40.0000 meq | EXTENDED_RELEASE_TABLET | Freq: Two times a day (BID) | ORAL | Status: AC
  Administered 2021-01-01 (×2): 40 meq via ORAL
  Filled 2021-01-01 (×2): qty 2

## 2021-01-01 MED ORDER — IOHEXOL 9 MG/ML PO SOLN
ORAL | Status: AC
  Filled 2021-01-01: qty 1000

## 2021-01-01 MED ORDER — IOHEXOL 9 MG/ML PO SOLN
500.0000 mL | ORAL | Status: AC
  Administered 2021-01-01 (×2): 500 mL via ORAL

## 2021-01-01 MED ORDER — ENSURE ENLIVE PO LIQD
237.0000 mL | Freq: Three times a day (TID) | ORAL | Status: DC
  Administered 2021-01-01 – 2021-01-02 (×2): 237 mL via ORAL
  Filled 2021-01-01: qty 237

## 2021-01-01 NOTE — Plan of Care (Signed)
  Problem: Skin Integrity: Goal: Risk for impaired skin integrity will decrease Outcome: Completed/Met   Problem: Pain Managment: Goal: General experience of comfort will improve Outcome: Completed/Met   Problem: Elimination: Goal: Will not experience complications related to urinary retention Outcome: Completed/Met   Problem: Coping: Goal: Level of anxiety will decrease Outcome: Completed/Met

## 2021-01-01 NOTE — Telephone Encounter (Signed)
Dylan [name] DOB: 1945-08-10 MRN: [id number]   RIDER WAIVER AND RELEASE OF LIABILITY  For purposes of improving physical access to our facilities, [facility] is pleased to partner with third parties to provide [facility] patients or other authorized individuals the option of convenient, on-demand ground transportation services (the [place]") through use of the technology service that enables users to request on-demand ground transportation from independent third-party providers.  By opting to use and accept these [employer], I, the undersigned, hereby agree on behalf of myself, and on behalf of any minor child using the Government social research officer for whom I am the parent or legal guardian, as follows:  Government social research officer provided to me are provided by independent third-party transportation providers who are not [employer] or employees and who are unaffiliated with [employer]. [facility] is neither a transportation carrier nor a common or public carrier. [facility] has no control over the quality or safety of the transportation that occurs as a result of the [employer]. [facility] cannot guarantee that any third-party transportation provider will complete any arranged transportation service. [facility] makes no representation, warranty, or guarantee regarding the reliability, timeliness, quality, safety, suitability, or availability of any of the Transport Services or that they will be error free. I fully understand that traveling by vehicle involves risks and dangers of serious bodily injury, including permanent disability, paralysis, and death. I agree, on behalf of myself and on behalf of any minor child using the Transport Services for whom I am the parent or legal guardian, that the entire risk arising out of my use of the [employer] remains solely with me, to the maximum extent permitted under applicable law. The [employer] are provided "as is"  and "as available." [facility] disclaims all representations and warranties, express, implied or statutory, not expressly set out in these terms, including the implied warranties of merchantability and fitness for a particular purpose. I hereby waive and release [facility], its agents, employees, officers, directors, representatives, insurers, attorneys, assigns, successors, subsidiaries, and affiliates from any and all past, present, or future claims, demands, liabilities, actions, causes of action, or suits of any kind directly or indirectly arising from acceptance and use of the [employer]. I further waive and release [facility] and its affiliates from all present and future liability and responsibility for any injury or death to persons or damages to property caused by or related to the use of the [employer]. I have read this Waiver and Release of Liability, and I understand the terms used in it and their legal significance. This Waiver is freely and voluntarily given with the understanding that my right (as well as the right of any minor child for whom I am the parent or legal guardian using the [employer]) to legal recourse against [facility] in connection with the [employer] is knowingly surrendered in return for use of these services.   I attest that I read the consent document to Canary Brim, gave Mr. Saleeby the opportunity to ask questions and answered the questions asked (if any). I affirm that Canary Brim then provided consent for he's participation in this program.     Dylan [name]

## 2021-01-01 NOTE — Progress Notes (Signed)
Subjective:  Dylan [name] was examined and evaluated at bedside this am. He mentions 'feeling good' this morning. He noted drop in his blood pressure with positional changes but denies any further episodes of dizziness or light-headedness. He provides additional history regarding his symptoms and his dizziness has been ongoing for couple months. For his presenting symptom, he mentions having episode of 'feeling like passing out' while he was eating on the couch with associated shaking. Denies any cough. Mentions living at home with his son.  Objective:  Vital signs in last 24 hours: Vitals:   01/01/21 0627 01/01/21 0632 01/01/21 0635 01/01/21 0712  BP: (!) 166/96 (!) 136/99 (!) 180/96 (!) 178/106  Pulse: (!) 56 86 82 80  Resp:    17  Temp:    98.5 F (36.9 C)  TempSrc:    Oral  SpO2:    97%  Weight:      Height:       Physical Exam: General: elderly male, lying in bed, NAD. CV: normal rate and irregular rhythm, no m/r/g. Pulm: CTABL, no adventitious sounds noted. Abdomen: soft, nondistended, nontender, normoactive bowel sounds. Neuro: AAOx4, no focal deficits. Skin: warm and dry.  Assessment/Plan:  Active Problems:   Syncope  Recurrent syncope Majority of patient's symptoms appear to be consistent with orthostatic hypotension.  He has had de-escalation of his antihypertensives, but continuing to have orthostasis.  Orthostatic vitals today remain positive.  Patient's orthostatic hypotension is likely being worsened by recent unintentional weight loss (see below).  We will continue to hold home hydralazine and labetalol and may need to tolerate higher systolic blood pressures at rest.  No hyponatremia, hyperkalemia, abdominal symptoms to suggest adrenal insufficiency.  We will continue to closely watch telemetry given syncopal episode at rest, but no clear atrial fibrillation noted thus far. -Echo with no PFO/shunt noted -CPAP nightly -Cardiac monitoring -PT/OT eval and  treat -Repeat orthostatic vitals tomorrow  Unintentional weight loss Patient with profound recent weight loss of about 50 pounds over the last 3 months and about 12 pounds over the last 1 month alone.  Current weight 69 kg with a BMI of 24.  Patient appears deconditioned and sarcopenia on exam.  Advanced age also raises concern for underlying malignancy.  He does have a history of tobacco use although he stopped about 30 years ago.  Patient had a normal colonoscopy in August 2020.  He was recently treated with Trulicity which can cause weight loss, but he has had continued weight loss even after discontinuation of Trulicity. -CT chest/abdomen/pelvis to look for underlying primary malignancy  Resistant hypertension Secondary to untreated severe OSA.  Patient is nonadherent to CPAP.  Will continue holding home hydralazine and eplerenone at this time.  Given significant orthostasis, will need to tolerate higher systolic blood pressures at rest. -Continue verapamil 360 mg daily  Heart failure with preserved ejection fraction Severe pulmonary artery hypertension Severe untreated OSA Echo showing LVEF of 50% with global hypokinesis of LV and severe concentric LVH, moderate pericardial effusion (stable when compared to prior), moderately dilated LA.  We will hold off on adding diuretic at this time as patient remains euvolemic on exam. -CPAP nightly  CKD stage IIIA Baseline creatinine ~1.3-1.5 with eGFR ~45.  Chronic kidney disease likely secondary to longstanding hypertension and type 2 diabetes.  He is followed by Dr. Justin Mend as an outpatient.  His renal function is stable and at baseline. -Avoid nephrotoxic medications  Prior to Admission Living Arrangement: Home Anticipated Discharge Location: TBD  Barriers to Discharge: continued medical management Dispo: Anticipated discharge in approximately 1-2 day(s).   Virl Axe, MD 01/01/2021, 7:14 AM Pager: [phone] After 5pm on weekdays and 1pm  on weekends: On Call pager [phone]

## 2021-01-01 NOTE — Progress Notes (Signed)
*  PRELIMINARY RESULTS* Echocardiogram 2D Echocardiogram has been performed.  Merrie Roof F 01/01/2021, 9:27 AM

## 2021-01-01 NOTE — Progress Notes (Signed)
Orthostatics:  lying 159/101, HR 59                       Sitting 166/96, HR 56                       Standing 136/99, HR 86                       Standing 3 min 180/96 Pt was not dizzy at all while doing them, Thanks [place].

## 2021-01-01 NOTE — Progress Notes (Signed)
Initial Nutrition Assessment  DOCUMENTATION CODES:  Severe malnutrition in context of acute illness/injury, Non-severe (moderate) malnutrition in context of chronic illness  INTERVENTION:  Add Ensure Enlive po TID, each supplement provides 350 kcal and 20 grams of protein.  Add Magic cup TID with meals, each supplement provides 290 kcal and 9 grams of protein.  Add MVI with minerals daily.  NUTRITION DIAGNOSIS:  Severe Malnutrition related to acute illness as evidenced by percent weight loss, moderate muscle depletion, mild muscle depletion.  GOAL:  Patient will meet greater than or equal to 90% of their needs  MONITOR:  PO intake, Supplement acceptance, Labs, Weight trends, I & O's  REASON FOR ASSESSMENT:  Malnutrition Screening Tool    ASSESSMENT:  Mr. 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 He visited his primary care office and his blood pressure medications were adjusted. Over the past few weeks, he began to experience dizziness more and more often and now can happen 2-3 times a day. The episodes have worsened as of a week ago and have led to him losing consciousness twice in the past week. They have begun to occur even when he is sitting or lying down. Today, he was sitting on his couch while his son was making him breakfast when all of a sudden, he became dizzy, started shaking, and eventually fainted all while remaining seated. Denies any tongue biting or urinary incontinence during the shaking episode. This is complicated by a recent large unintentional weight loss, by review of the records he has lost about 50 pounds over the last 3 months, about 12 pounds over the last 1 month alone.  RD working remotely.  Per Epic, pt ate 100% of breakfast and lunch today. Per H&P and MD notes, pt denies any  changes in PO intake, but has still lost weight despite quitting smoking almost 30 years ago and stopping Trulicity (a side effect is weight loss). Pt also denies a change in appetite per MST screen on admission.  Pt reports he was hospitalized about 6 weeks ago for DKA and gout flare. He was discharged home on a liquid diet, which he followed for 3 weeks (he consumed mainly oatmeal during that time). Over the past 3 weeks PTA, he consumed 3-4 meals per day- consisting of [place] sandwiches OR oatmeal, eggs, and fruit juice OR fried chicken and vegetables. He stopped drinking fruit juices and sugary beverages to help control his blood sugar. Currently on tresiba at home and CBG readings (pre-meal) range between 110-170. He has had hypoglycemic episodes in the past, but not since transitioning back to solid foods. He knows how to treat hypoglycemia.   Reports his UBW as 205 lbs. He reports a 50 lb weight loss in the last 6 months. Per Epic, pt has lost ~50 lbs (24.8%) in the last 3 months, which is significant and severe for the time frame.  Pt with some depletions in muscle and only one in fat stores.  Given the above information, pt is severely malnourished in the acute setting and moderately malnourished in the chronic setting.  Recommend adding Ensure Enlive TID, Magic Cup TID, and MVI with minerals daily for repletion.  Medications: reviewed; EE BID, SSI, Klor-Con 40 mEq  Labs: reviewed; K 3.4 (L) CBG 122-272 HbA1c: 6.4% (12/2020)  NUTRITION - FOCUSED PHYSICAL EXAM: Flowsheet Row Most Recent Value  Orbital Region No depletion  Upper Arm Region Moderate depletion  Thoracic and Lumbar Region No depletion  Buccal Region No depletion  Temple Region Mild depletion  Clavicle Bone Region Mild depletion  Clavicle and Acromion Bone Region Mild depletion  Scapular Bone Region Mild depletion  Dorsal Hand No depletion  Patellar Region Moderate depletion  Anterior Thigh Region Moderate depletion   Posterior Calf Region Moderate depletion  Edema (RD Assessment) None  Hair Reviewed  Eyes Reviewed  Mouth Reviewed  Skin Reviewed  Nails Reviewed   Diet Order:   Diet Order             Diet heart healthy/carb modified Room service appropriate? Yes; Fluid consistency: Thin  Diet effective now                  EDUCATION NEEDS:  Education needs have been addressed  Skin:  Skin Assessment: Reviewed RN Assessment  Last BM:  12/31/20  Height:  Ht Readings from Last 1 Encounters:  12/31/20 _0  (1.676 m)   Weight:  Wt Readings from Last 1 Encounters:  01/01/21 69.1 kg   Ideal Body Weight:  59.1 kg  BMI:  Body mass index is 24.6 kg/m.  Estimated Nutritional Needs:  Kcal:  2050-2250 Protein:  90-105 grams Fluid:  >2 L  Derrel Nip, RD, LDN (she/her/hers) Registered Dietitian I After-Hours/Weekend Pager # in [place]

## 2021-01-01 NOTE — Discharge Instructions (Addendum)
Low-Purine/Purine-Restricted Nutrition Therapy  This diet will help reduce the amount of uric acid in your blood. You will need to limit foods with purine (a kind of uric acid). You should drink little or no alcohol.  Foods Recommended The chart shows foods that are low to moderate in purines.  You can eat any amount of the foods that are low in purine.  For the foods that are moderate in purines, stick to the amounts shown in the chart.  Food Group Foods Low in Purines Foods Moderate in Purines Beverages Water, tea, coffee, cocoa   Breads and Cereals Bread, pasta, rice, cake, cornbread, popcorn Oatmeal (do not eat more than 2/3 cup uncooked, daily) Wheat bran, wheat germ (do not eat more than 1/4 cup dry, daily) Condiments Salt, herbs, olives, pickles, relishes, vinegar   Dairy All dairy foods (low-fat or fat-free types are best)   Fats and Oils All types, except gravies and sauces made with meat   Fruits All   Proteins Eggs, nuts, peanut butter Meat and Poultry Crab, lobster, oysters and shrimp (limit to 1-2 servings* daily) Dried beans, peas, and lentils (limit to 1 cup cooked daily) Soups Soups made without meat Meat- or fish-based soups, broths, or bouillons Vegetables All vegetables, except those that are moderate in purines Asparagus, cauliflower, spinach, mushrooms, green peas (do not eat more than 1/2 cup of these vegetables daily) Other Foods Sugars, sweets, gelatin   *1 serving = 2-3 ounces.  Foods Not Recommended No foods must be completely avoided. However, you should limit foods that are high in purines.  Food Group Foods High in Purines Beverages Beer and other alcoholic beverages Fats and Oils Gravies and sauces made with meat Proteins Anchovies, sardines, herring, mussels, tuna, codfish, scallops, trout, and haddock; bacon; organ meats (such as liver or kidney); tripe; sweetbreads; wild game; goose Other Yeast and yeast extracts (taken as  supplements)  Low-Purine/Purine-Restricted Sample 1-Day Menu  Breakfast 1 cup cereal 1 cup 1% or fat-free milk 1 slice whole wheat toast 1 teaspoon butter or margarine and 1 teaspoon jam (for toast) 1/2 cup orange juice 1 cup coffee Lunch Sandwich made with 2 slice whole wheat bread 2 tablespoons peanut butter 1/2 cup banana slices 1/2 cup fruit crisp 1 cup 1% or fat-free milk Dinner 1 cup cheese lasagna Salad made with 1 cup lettuce and 1/2 cup tomatoes 1 slice [place] bread 1/2 cup fruit gelatin 1 cup water  Low-Purine/Purine-Restricted Vegan Sample 1-Day Menu  Breakfast 1 cup grits  cup grapefruit 1 slice whole wheat toast 1 teaspoon margarine, soft, tub 1 cup soymilk fortified with calcium, vitamin B12, and vitamin D 1 cup coffee Lunch 2 slices whole wheat bread 2 tablespoons peanut butter 1 banana  cup hummus  cup carrot strips  cup celery strips 1 cup soymilk fortified with calcium, vitamin B12, and vitamin D Evening Meal 1 black bean veggie burger 1 medium sweet potato, baked 1 cup steamed broccoli 2 teaspoons olive oil  cup applesauce 1 small roll 6 ounces vanilla soy yogurt  Low-Purine/Purine-Restricted Vegetarian (Lacto-Ovo) Sample 1-Day Menu  Breakfast 1 cup grits  grapefruit 1 hard-boiled egg 1 cup 1% milk 1 cup coffee Lunch 2 slices whole wheat bread 2 tablespoons peanut butter 1 banana  cup carrot strips  cup celery strips 1 cup 1% milk Evening Meal 1 cup whole wheat spaghetti  cup kidney beans  cup marinara sauce with mushrooms and green peppers 1 cup steamed broccoli 1 teaspoons olive oil  cup grapes  6 ounces low-fat vanilla yogurt  Copyright 2022  [employer]. All rights reserved.

## 2021-01-02 ENCOUNTER — Telehealth: Payer: Self-pay | Admitting: Internal Medicine

## 2021-01-02 ENCOUNTER — Encounter: Payer: Self-pay | Admitting: *Deleted

## 2021-01-02 ENCOUNTER — Other Ambulatory Visit (HOSPITAL_COMMUNITY): Payer: Self-pay

## 2021-01-02 ENCOUNTER — Other Ambulatory Visit: Payer: Self-pay | Admitting: Medical

## 2021-01-02 DIAGNOSIS — R55 Syncope and collapse: Secondary | ICD-10-CM

## 2021-01-02 LAB — BASIC METABOLIC PANEL
Anion gap: 9 (ref 5–15)
BUN: 22 mg/dL (ref 8–23)
CO2: 31 mmol/L (ref 22–32)
Calcium: 9.6 mg/dL (ref 8.9–10.3)
Chloride: 98 mmol/L (ref 98–111)
Creatinine, Ser: 1.21 mg/dL (ref 0.61–1.24)
GFR, Estimated: >60 mL/min (ref >60)
Glucose, Bld: 137 mg/dL — ABNORMAL HIGH (ref 70–99)
Potassium: 4 mmol/L (ref 3.5–5.1)
Sodium: 138 mmol/L (ref 135–145)

## 2021-01-02 LAB — T3, FREE: T3, Free: 2.1 pg/mL (ref 2.0–4.4)

## 2021-01-02 LAB — URINE CULTURE: Culture: NO GROWTH

## 2021-01-02 LAB — GLUCOSE, CAPILLARY
Glucose-Capillary: 123 mg/dL — ABNORMAL HIGH (ref 70–99)
Glucose-Capillary: 228 mg/dL — ABNORMAL HIGH (ref 70–99)

## 2021-01-02 MED ORDER — VERAPAMIL HCL ER 180 MG PO TBCR
360.0000 mg | EXTENDED_RELEASE_TABLET | Freq: Every day | ORAL | 1 refills | Status: DC
  Filled 2021-01-02: qty 60, 30d supply, fill #0

## 2021-01-02 NOTE — Evaluation (Signed)
Physical Therapy Evaluation Patient Details Name: Dylan [name] MRN: [id number] DOB: 1945-08-10 Today's Date: 01/02/2021   History of Present Illness  Pt is a 75 y.o. male admitted 12/31/20 with weakness, recurrent syncope, unintentional weight loss. Workup for orthostatic hypotension, afib. Head CT negative for acute abnormality; small vessel white matter disease. PMH includes HTN, DM, CAD, PVD, OSA with severe pulmonary HTN, CKD 3, gout.   Clinical Impression  Pt presents with an overall decrease in functional mobility secondary to above. PTA, pt mod indep ambulating with RW, lives with son who assists with household tasks as needed. Today, pt moving well with RW at supervision-level; pt with intermittent c/o dizziness during session, but ultimately still able to mobilize short bouts. Educ on precautions, activity recommendations, fall risk reduction strategies. Pt would benefit from continued acute PT services to maximize functional mobility and independence prior to d/c home.   Orthostatic BPs Supine 114/81  Sitting 129/83  Standing 135/84  Standing after 2-min 156/--  Post-ambulation 128/75  Seated after 3-min 135/73      Follow Up Recommendations No PT follow up;Supervision - Intermittent    Equipment Recommendations  None recommended by PT    Recommendations for Other Services       Precautions / Restrictions Precautions Precautions: Fall;Other (comment) Precaution Comments: Watch BP (not initially orthostatic, but BP did drop after ambulation; pt inconsistently symptomatic) Restrictions Weight Bearing Restrictions: No      Mobility  Bed Mobility Overal bed mobility: Independent                  Transfers Overall transfer level: Needs assistance Equipment used: Rolling walker (2 wheeled) Transfers: Sit to/from Stand Sit to Stand: Supervision            Ambulation/Gait Ambulation/Gait assistance: Supervision Gait Distance (Feet): 20 Feet Assistive  device: Rolling walker (2 wheeled) Gait Pattern/deviations: Step-through pattern;Decreased stride length;Trunk flexed Gait velocity: Decreased   General Gait Details: Slow, steady gait with RW and supervision for safety; intermittent c/o dizziness  Stairs            Wheelchair Mobility    Modified Rankin (Stroke Patients Only)       Balance Overall balance assessment: Needs assistance   Sitting balance-Leahy Scale: Good Sitting balance - Comments: able to don bilateral socks sitting EOB     Standing balance-Leahy Scale: Fair Standing balance comment: Can static stand without UE support, static and dynamic stability improved with RW                             Pertinent Vitals/Pain Pain Assessment: No/denies pain    Home Living Family/patient expects to be discharged to:: Private residence Living Arrangements: Children Available Help at Discharge: Family;Available PRN/intermittently Type of Home: Apartment Home Access: Stairs to enter Entrance Stairs-Rails: Right Entrance Stairs-Number of Steps: 4 Home Layout: One level Home Equipment: Cane - single point;Walker - 4 wheels;Shower seat Additional Comments: Reports son works outside the home, but able to assist as needed ("I'll call him if I need him to come help me")    Prior Function Level of Independence: Independent with assistive device(s)         Comments: Mod indep ambulating with RW, sits on fold-up chair to shower (shower chair does not fit in tub well); son drives and assists with majority of household tasks     Hand Dominance        Extremity/Trunk Assessment  Upper Extremity Assessment Upper Extremity Assessment: Overall WFL for tasks assessed    Lower Extremity Assessment Lower Extremity Assessment: Generalized weakness       Communication   Communication: HOH  Cognition Arousal/Alertness: Awake/alert Behavior During Therapy: WFL for tasks assessed/performed Overall  Cognitive Status: Within Functional Limits for tasks assessed                                 General Comments: WFL for simple tasks; requires some redirection to conversation/task; suspect near baseline cognition      General Comments General comments (skin integrity, edema, etc.): Educ re: activity recommendations, fall risk reduction, strategies to reduce orthostatic hypotension signs/symptoms    Exercises     Assessment/Plan    PT Assessment Patient needs continued PT services  PT Problem List Decreased activity tolerance;Decreased balance;Decreased mobility;Cardiopulmonary status limiting activity       PT Treatment Interventions DME instruction;Gait training;Stair training;Functional mobility training;Therapeutic activities;Therapeutic exercise;Balance training;Patient/family education    PT Goals (Current goals can be found in the Care Plan section)  Acute Rehab PT Goals Patient Stated Goal: Return home PT Goal Formulation: With patient Time For Goal Achievement: 01/16/21 Potential to Achieve Goals: Good    Frequency Min 3X/week   Barriers to discharge        Co-evaluation               AM-PAC PT "6 Clicks" Mobility  Outcome Measure Help needed turning from your back to your side while in a flat bed without using bedrails?: None Help needed moving from lying on your back to sitting on the side of a flat bed without using bedrails?: None Help needed moving to and from a bed to a chair (including a wheelchair)?: A Little Help needed standing up from a chair using your arms (e.g., wheelchair or bedside chair)?: A Little Help needed to walk in hospital room?: A Little Help needed climbing 3-5 steps with a railing? : A Little 6 Click Score: 20    End of Session   Activity Tolerance: Patient tolerated treatment well Patient left: in chair;with call bell/phone within reach;with chair alarm set;with nursing/sitter in room Nurse Communication:  Mobility status PT Visit Diagnosis: Other abnormalities of gait and mobility (R26.89)    Time: 7096-2836 PT Time Calculation (min) (ACUTE ONLY): 29 min   Charges:   PT Evaluation $PT Eval Moderate Complexity: 1 Mod PT Treatments $Self Care/Home Management: 8-22      Mabeline Caras, PT, DPT Acute Rehabilitation Services  Pager [phone] Office [place] 01/02/2021, 9:18 AM

## 2021-01-02 NOTE — Progress Notes (Signed)
   Cardiology asked to place order for 30-day monitor to further evaluate etiology of recurrent syncope. Patient has not been seen by [facility] since 2019. Appointment scheduled with Dr. Audie Box following completion of his cardiac monitor, therefore will place order for Dr. Audie Box to read.   Abigail Butts, PA-C 01/02/21; 3:10 PM

## 2021-01-02 NOTE — Telephone Encounter (Signed)
TOC HFU APPT [facility] FOR 01/17/2021 WITH DR BRASWELL @ 10:15 AM

## 2021-01-02 NOTE — Evaluation (Signed)
Occupational Therapy Evaluation Patient Details Name: Dylan [name] MRN: [id number] DOB: [birth year] Today's Date: 01/02/2021    History of Present Illness Pt is a 75 y.o. male admitted 12/31/20 with weakness, recurrent syncope, unintentional weight loss. Workup for orthostatic hypotension, afib. Head CT negative for acute abnormality; small vessel white matter disease. PMH includes HTN, DM, CAD, PVD, OSA with severe pulmonary HTN, CKD 3, gout.   Clinical Impression   Pt with intermittent assist from his son prior this admission, he was seen for acute OT assessment and is overall supervision-Min assist for LB ADL's and transfers. He is currently close to baseline level of function and has necessary DME at at home. Educated pt in use on bedside commode to assist with increased independence especially during flare up's from gout. He denies any further acute OT needs at this time and son/family are able to assist at d/c. Will sign off at this time.    Follow Up Recommendations  Supervision - Intermittent;No OT follow up    Equipment Recommendations  None recommended by OT;Other (comment) (Pt has 3:1 and adequate DME at home & son PRN assist)    Recommendations for Other Services       Precautions / Restrictions Precautions Precautions: Fall;Other (comment) Precaution Comments: Watch BP (not initially orthostatic, but BP did drop after ambulation; pt inconsistently symptomatic) Restrictions Weight Bearing Restrictions: No      Mobility Bed Mobility Overal bed mobility: Independent                  Transfers Overall transfer level: Needs assistance Equipment used: Rolling walker (2 wheeled) Transfers: Sit to/from Stand Sit to Stand: Supervision              Balance Overall balance assessment: Needs assistance   Sitting balance-Leahy Scale: Good Sitting balance - Comments: able to don bilateral socks sitting EOB     Standing balance-Leahy Scale: Fair Standing  balance comment: Can static stand without UE support, static and dynamic stability improved with RW (Standing at sink during ADL's)                           ADL either performed or assessed with clinical judgement   ADL Overall ADL's : Needs assistance/impaired (Son assists PRN) Eating/Feeding: Modified independent;Independent   Grooming: Wash/dry hands;Wash/dry face;Oral care;Applying deodorant;Modified independent;Supervision/safety;Sitting;Standing (Pt performed all of the above during assessment, sit to stand at sink)   Upper Body Bathing: Modified independent;Sitting;Standing   Lower Body Bathing: Modified independent;Sitting/lateral leans;Sit to/from stand   Upper Body Dressing : Modified independent;Sitting   Lower Body Dressing: Supervision/safety;Minimal assistance;Sit to/from stand   Toilet Transfer: Supervision/safety;Min guard;Ambulation;RW (Simulated sit to stand at sink. Pt has 3:1 at home does not use, prefers to amb into bathroom)   Toileting- Clothing Manipulation and Hygiene: Sitting/lateral lean;Sit to/from stand;Supervision/safety;Minimal assistance   Tub/ Shower Transfer: Walk-in shower;Rolling walker;Ambulation;Shower seat;Supervision/safety;Minimal assistance (Pt has 3:1 does not use, uses a folding chair in his shower at home)   Functional mobility during ADLs: Minimal assistance;Rolling walker       Vision Baseline Vision/History:  (NT) Vision Assessment?: No apparent visual deficits     Perception     Praxis Praxis Praxis tested?: Not tested    Pertinent Vitals/Pain Pain Assessment: No/denies pain     Hand Dominance Right   Extremity/Trunk Assessment Upper Extremity Assessment Upper Extremity Assessment: Overall WFL for tasks assessed;Generalized weakness   Lower Extremity Assessment Lower Extremity Assessment: Defer to  PT evaluation   Cervical / Trunk Assessment Cervical / Trunk Assessment:  (Defer to PT)   Communication  Communication Communication: HOH   Cognition Arousal/Alertness: Awake/alert Behavior During Therapy: WFL for tasks assessed/performed Overall Cognitive Status: Within Functional Limits for tasks assessed                                 General Comments: WFL for simple tasks; requires some redirection to conversation/task; suspect near baseline cognition   General Comments  Educ re: activity recommendations, fall risk reduction, strategies to reduce orthostatic hypotension signs/symptoms    Exercises     Shoulder Instructions      Home Living Family/patient expects to be discharged to:: Private residence Living Arrangements: Children Available Help at Discharge: Family;Available PRN/intermittently Type of Home: Apartment Home Access: Stairs to enter Entrance Stairs-Number of Steps: 4 Entrance Stairs-Rails: Right Home Layout: One level     Bathroom Shower/Tub: Teacher, early years/pre: Standard     Home Equipment: Cane - single point;Walker - 4 wheels;Shower seat   Additional Comments: Reports son works outside the home, but able to assist as needed ("I'll call him if I need him to come help me")      Prior Functioning/Environment Level of Independence: Independent with assistive device(s)        Comments: Mod indep ambulating with RW, sits on fold-up chair to shower (shower chair does not fit in tub well); son drives and assists with majority of household tasks (Pt has 3:1 that he does not use)        OT Problem List: Decreased strength;Decreased activity tolerance;Impaired balance (sitting and/or standing);Decreased knowledge of use of DME or AE      OT Treatment/Interventions:      OT Goals(Current goals can be found in the care plan section) Acute Rehab OT Goals Patient Stated Goal: Return home  OT Frequency:     Barriers to D/C:            Co-evaluation              AM-PAC OT "6 Clicks" Daily Activity     Outcome Measure  Help from another person eating meals?: None Help from another person taking care of personal grooming?: A Little Help from another person toileting, which includes using toliet, bedpan, or urinal?: None Help from another person bathing (including washing, rinsing, drying)?: A Little Help from another person to put on and taking off regular upper body clothing?: A Little Help from another person to put on and taking off regular lower body clothing?: A Little 6 Click Score: 20   End of Session Equipment Utilized During Treatment: Gait belt;Rolling walker  Activity Tolerance:   Patient left: in chair;with call bell/phone within reach;with chair alarm set  OT Visit Diagnosis: History of falling (Z91.81);Muscle weakness (generalized) (M62.81);Unsteadiness on feet (R26.81)                Time: 8144-8185 OT Time Calculation (min): 41 min Charges:  OT General Charges $OT Visit: 1 Visit OT Evaluation $OT Eval Low Complexity: 1 Low OT Treatments $Self Care/Home Management : 23-37 mins   Kieron Kantner Beth Dixon, OTR/L 01/02/2021, 11:40 AM

## 2021-01-02 NOTE — Progress Notes (Signed)
D/C instructions given and reviewed. Tele and IV removed, tolerated well. Family will transport home.

## 2021-01-02 NOTE — Discharge Summary (Signed)
Name: Dylan [name] MRN: [id number] DOB: 1946/03/05 75 y.o. PCP: Angelica Pou, MD  Date of Admission: 12/31/2020  1:03 PM Date of Discharge: 01/02/2021 Attending Physician: Axel Filler, *  Discharge Diagnosis: 1.  Recurrent syncope   2.  Unintentional weight loss   3.  Resistant hypertension  4.  Heart failure with preserved ejection fraction, severe pulmonary artery hypertension, severe untreated OSA  5.  CKD stage IIIa  Discharge Medications: Allergies as of 01/02/2021       Reactions   Ace Inhibitors Other (See Comments)   "ARF - see CRF overview"   Spironolactone Other (See Comments)   Gynecomastia per pt report        Medication List     STOP taking these medications    eplerenone 50 MG tablet Commonly known as: INSPRA   hydrALAZINE 50 MG tablet Commonly known as: APRESOLINE   labetalol 200 MG tablet Commonly known as: NORMODYNE   Trulicity 1.5 EH/2.1YY Sopn Generic drug: Dulaglutide       TAKE these medications    allopurinol 100 MG tablet Commonly known as: ZYLOPRIM Take 1 tablet (100 mg total) by mouth daily.   aspirin 81 MG EC tablet Take 81 mg by mouth daily. Swallow whole.   atorvastatin 40 MG tablet Commonly known as: LIPITOR Take 1 tablet (40 mg total) by mouth daily.   carboxymethylcellulose 0.5 % Soln Commonly known as: REFRESH PLUS Place 1 drop into both eyes 3 (three) times daily as needed (dry eyes).   cycloSPORINE 0.05 % ophthalmic emulsion Commonly known as: Restasis Place 1 drop into both eyes 2 (two) times daily.   FREESTYLE LITE test strip Generic drug: glucose blood Use to check blood sugar 3 times daily, DIAG CODE E11.29, INSULIN DEPENDENT What changed:  how much to take how to take this when to take this additional instructions   pantoprazole 40 MG tablet Commonly known as: PROTONIX Take 1 tablet (40 mg total) by mouth 2 (two) times daily for 14 days, THEN 1 tablet (40 mg total) daily for 14  days. Start taking on: Nov 23, 2020   Pen Needles 31G X 5 MM Misc Inject 1 Dose into the skin daily. With Victoza DIAG CODE E11.29. INSULIN DEPENDENT   polyethylene glycol 17 g packet Commonly known as: MIRALAX / GLYCOLAX Take 17 g by mouth daily as needed (constipation).   verapamil 360 MG 24 hr capsule Commonly known as: VERELAN PM Take 1 capsule (360 mg total) by mouth daily.        Disposition and follow-up:   [name] was discharged from [facility] in Stable condition.  At the hospital follow up visit please address:  1.  Recurrent syncope likely secondary to orthostatic hypotension.  Discussed with cardiology to provide patient with 30-day cardiac monitor, which will be mailed to patient with instructions. F/u with PCP in 1-2 weeks.  2.  Unintentional weight loss.  About 50 pound weight loss in the past 3 months.  CT chest/abdomen/pelvis negative for malignancy.  Likely secondary to Trulicity for diabetes (patient may be sensitive to Trulicity).  Will discontinue Trulicity at discharge.  3.  Resistant hypertension.  Discharged with home verapamil alone.  Discontinue hydralazine and eplerenone at discharge.  Given significant orthostasis, likely will need to tolerate higher systolic blood pressures at rest. F/u with PCP in 1-2 weeks.  4.  Heart failure with preserved ejection fraction, severe pulmonary artery hypertension, severe untreated OSA.  Does not  adhere to CPAP at home.  Echo this admission showing less vigorous LVEF, now 50%, severe concentric LVH, and stable pericardial effusion.  Remained euvolemic throughout admission.  5.  CKD stage IIIa.  Stable.  Baseline creatinine about 1.3-1.5 with EGFR about 50.  Today, creatinine 1.21 with EGFR >60.  2.  Labs / imaging needed at time of follow-up: BMP  3.  Pending labs/ test needing follow-up: None  Follow-up Appointments:  Follow-up Information     Angelica Pou, MD. Go in 2 week(s).    Specialty: Internal Medicine Contact information: [address] [address] [place] Hospital Course by problem list: 1.  Recurrent syncope likely secondary to orthostatic hypotension.  Patient has history of orthostatic hypotension that has persisted despite de-escalation of antihypertensives.  His orthostatic hypotension is likely being worsened by recent unintentional weight loss (see below).  Cardiac work-up has been negative, but telemetry and EKG have shown sinus tachyarrhythmia since admission.  Patient denies any chest pain or palpitations.  No hyponatremia, hyperkalemia, abdominal symptoms to suggest adrenal insufficiency.  Echo did not show any PFO/shunt.  There is a possibility that syncope could be related to untreated severe OSA, but patient refuses to wear CPAP.  Patient was able to tolerate working with physical therapy well, no orthostasis observed.  Will discontinue his home hydralazine and eplerenone at discharge and only continue home verapamil.  Discussed with cardiology to provide patient with 30-day cardiac monitor, which will be mailed to patient with instructions. F/u with PCP in 1-2 weeks.  2.  Unintentional weight loss.  Patient with about 50 pound weight loss in the past 3 months and he appears somewhat deconditioned and sarcopenia on exam.  Concern for underlying malignancy due to advanced age and history of tobacco use.  CT chest/abdomen/pelvis negative for primary malignancy.  Of note, patient has been treated with Trulicity recently which could cause weight loss.  His long-term glycemic control appears to be relatively well controlled.  Will discontinue Trulicity at discharge until follow-up with PCP.  3.  Resistant hypertension.  Discharged with verapamil XL.  Discontinue hydralazine and eplerenone at discharge.  Given significant orthostasis, likely will need to tolerate higher systolic blood pressures at rest. F/u with PCP in  1-2 weeks.  4.  Heart failure with preserved ejection fraction, severe pulmonary artery hypertension, severe untreated OSA.  Does not adhere to CPAP at home.  Echo this admission showing less vigorous LVEF, now 50%, severe concentric LVH, and stable pericardial effusion.  Patient was previously on torsemide but this was discontinued due to orthostatic hypotension.  Patient has remained euvolemic throughout admission.  5.  CKD stage IIIa.  Stable.  Likely secondary to longstanding hypertension.  Baseline creatinine about 1.3-1.5 with EGFR about 50.  Today, creatinine 1.21 with EGFR >60.  Patient follows Dr. Justin Mend as an outpatient.  Discharge Exam:   BP (!) 139/98 (BP Location: Left Arm)   Pulse 79   Temp 98 F (36.7 C) (Oral)   Resp 17   Ht _0  (1.676 m)   Wt 69.7 kg   SpO2 100%   BMI 24.79 kg/m  Discharge exam:  General: well-developed elderly male, sitting up in chair, NAD. CV: tachycardic rate and regular rhythm, no m/r/g. Pulm: CTABL, no adventitious sounds noted. Abdomen: soft, nontender, nondistended, normoactive bowel sounds. Neuro: AAOx4, no focal deficits. Skin: warm and dry. No erythema, skin breakdown,  or lesions noted around sacrum.   Pertinent Labs, Studies, and Procedures:  CBC Latest Ref Rng & Units 12/31/2020 11/28/2020 11/23/2020  WBC 4.0 - 10.5 K/uL 11.0(H) 7.6 10.3  Hemoglobin 13.0 - 17.0 g/dL 13.1 10.2(L) 10.2(L)  Hematocrit 39.0 - 52.0 % 40.2 32.8(L) 30.0(L)  Platelets 150 - 400 K/uL 346 156 136(L)   CMP Latest Ref Rng & Units 01/02/2021 01/01/2021 12/31/2020  Glucose 70 - 99 mg/dL 137(H) 132(H) 233(H)  BUN 8 - 23 mg/dL 22 28(H) 28(H)  Creatinine 0.61 - 1.24 mg/dL 1.21 1.34(H) 1.44(H)  Sodium 135 - 145 mmol/L 138 137 135  Potassium 3.5 - 5.1 mmol/L 4.0 3.4(L) 3.2(L)  Chloride 98 - 111 mmol/L 98 103 98  CO2 22 - 32 mmol/L _0 Calcium 8.9 - 10.3 mg/dL 9.6 8.9 9.3  Total Protein 6.5 - 8.1 g/dL - - 7.7  Total Bilirubin 0.3 - 1.2 mg/dL - - 0.8  Alkaline Phos  38 - 126 U/L - - 67  AST 15 - 41 U/L - - 24  ALT 0 - 44 U/L - - 20   BNP    Component Value Date/Time   BNP 855.9 (H) 12/31/2020 1450   Urinalysis    Component Value Date/Time   COLORURINE YELLOW 12/31/2020 2207   APPEARANCEUR HAZY (A) 12/31/2020 2207   LABSPEC 1.017 12/31/2020 2207   PHURINE 5.0 12/31/2020 2207   GLUCOSEU NEGATIVE 12/31/2020 2207   HGBUR NEGATIVE 12/31/2020 2207   BILIRUBINUR NEGATIVE 12/31/2020 2207   BILIRUBINUR Negative 09/14/2020 1105   KETONESUR NEGATIVE 12/31/2020 2207   PROTEINUR 30 (A) 12/31/2020 2207   UROBILINOGEN 0.2 09/14/2020 1105   UROBILINOGEN 0.2 10/11/2014 0845   NITRITE NEGATIVE 12/31/2020 2207   LEUKOCYTESUR NEGATIVE 12/31/2020 2207    Lab Results  Component Value Date   TSH 0.677 12/31/2020   FREET4 1.49 (H) 12/31/2020    Discharge Instructions: Discharge Instructions     (HEART FAILURE PATIENTS) Call MD:  Anytime you have any of the following symptoms: 1) 3 pound weight gain in 24 hours or 5 pounds in 1 week 2) shortness of breath, with or without a dry hacking cough 3) swelling in the hands, feet or stomach 4) if you have to sleep on extra pillows at night in order to breathe.   Complete by: As directed    Call MD for:  extreme fatigue   Complete by: As directed    Call MD for:  persistant dizziness or light-headedness   Complete by: As directed    Diet - low sodium heart healthy   Complete by: As directed    Discharge instructions   Complete by: As directed    Mr. Markey, it was a pleasure taking care of you during your time here.  You came in after losing consciousness twice in the past week.  We think that your symptoms are from decreased blood pressure upon standing.   Please take note of the following: 1.  For your blood pressure, only take verapamil once daily as prescribed.  Please stop taking all of your other blood pressure medications.  2.  Please follow-up with the internal medicine clinic in 1 to 2 weeks.  Someone  will call to schedule this appointment with you.  3.  You will receive a 30-day heart monitor that will be mailed to your home.  Someone will call you to explain the instructions on how to place it on.  4.  When standing up from a seated position,  please make sure to hold still for a few minutes prior to walking.  This will help with the dizziness you have when you stand up.   Increase activity slowly   Complete by: As directed        Signed: Virl Axe, MD 01/02/2021, 11:53 AM   Pager: [phone]

## 2021-01-02 NOTE — Plan of Care (Signed)

## 2021-01-02 NOTE — Progress Notes (Unsigned)
Patient ID: Dylan [name], male   DOB: 1946-06-29, 75 y.o.   MRN: [id number] Patient enrolled for [employer] to ship a 30 day cardiac event monitor to address on file.   Letter with instructions mailed to patient.

## 2021-01-09 ENCOUNTER — Telehealth: Payer: Self-pay | Admitting: *Deleted

## 2021-01-09 NOTE — Telephone Encounter (Signed)
Patient and son called in stating that patient is still feeling dizzy when sitting up. Patient expressed frustration that after being in the hospital for this he still does not know what is going on or how to treat it. States monitor he wore in hospital was to have given enough data that someone should know how to treat it but they let him go without telling him anything. States this is an emergency. Explained if he felt this was an emergency he should head back to ED. States he would go if he could see his PCP. Explained that was not possible, he would be seen by ED staff. Also, explained that [facility] was sending him a heart monitor to wear for 30 days; relayed their contact info.  Also, reviewed med list. Patient was still taking Trulicity. They are concerned that if he stops it, they will have no way to treat elevated BGs (100-200).   HFU was scheduled for 7/13. This has been changed to 7/7. F/u with PCP was scheduled for 9/8. A sooner appt was given with PCP for 7/28.   Patient and son kept this RN on line for 22 minutes. Patient repeated multiple times that he wants to see his PCP. Explained that a message would be sent to PCP on his behalf.

## 2021-01-11 ENCOUNTER — Ambulatory Visit (HOSPITAL_COMMUNITY)
Admission: RE | Admit: 2021-01-11 | Discharge: 2021-01-11 | Disposition: A | Discharge status: Home / Self Care | Payer: Medicare Other | Source: Ambulatory Visit | Attending: Internal Medicine | Admitting: Internal Medicine

## 2021-01-11 ENCOUNTER — Ambulatory Visit (INDEPENDENT_AMBULATORY_CARE_PROVIDER_SITE_OTHER): Payer: Medicare Other | Admitting: Internal Medicine

## 2021-01-11 VITALS — BP 122/71 | HR 82 | Temp 98.0°F | Wt 150.3 lb

## 2021-01-11 DIAGNOSIS — R55 Syncope and collapse: Secondary | ICD-10-CM | POA: Diagnosis not present

## 2021-01-11 DIAGNOSIS — R42 Dizziness and giddiness: Secondary | ICD-10-CM

## 2021-01-11 DIAGNOSIS — I10 Essential (primary) hypertension: Secondary | ICD-10-CM

## 2021-01-11 DIAGNOSIS — R634 Abnormal weight loss: Secondary | ICD-10-CM

## 2021-01-11 DIAGNOSIS — E1121 Type 2 diabetes mellitus with diabetic nephropathy: Secondary | ICD-10-CM

## 2021-01-11 DIAGNOSIS — E119 Type 2 diabetes mellitus without complications: Secondary | ICD-10-CM | POA: Diagnosis not present

## 2021-01-11 DIAGNOSIS — Z7984 Long term (current) use of oral hypoglycemic drugs: Secondary | ICD-10-CM | POA: Diagnosis not present

## 2021-01-11 MED ORDER — METFORMIN HCL 500 MG PO TABS: 500.0000 mg | ORAL_TABLET | Freq: Every day | ORAL | 0 refills | Status: DC

## 2021-01-11 NOTE — Patient Instructions (Signed)
It was nice seeing you today! Thank you for choosing [facility] for your Primary Care.    Today we talked about:   Dizziness: We are still in the process of working up your dizziness. We know that your drastic weight loss is playing a role though given how much your blood pressure has dropped. Please make sure to stop all blood pressure medications.   It will be very important to stop Trulicity!!! To replace it, I have sent in a prescription for Metformin, once daily.   I will work on the home health services in the meantime.   I will reach out to the heart doctors to see what assistance we can find to place the monitor.   Please follow up with [name] in 2 weeks on July 21st at 11:00AM if possible.

## 2021-01-12 ENCOUNTER — Telehealth: Payer: Self-pay | Admitting: Cardiovascular Disease

## 2021-01-12 DIAGNOSIS — R42 Dizziness and giddiness: Secondary | ICD-10-CM

## 2021-01-12 DIAGNOSIS — E113592 Type 2 diabetes mellitus with proliferative diabetic retinopathy without macular edema, left eye: Secondary | ICD-10-CM | POA: Diagnosis not present

## 2021-01-12 DIAGNOSIS — H401131 Primary open-angle glaucoma, bilateral, mild stage: Secondary | ICD-10-CM | POA: Diagnosis not present

## 2021-01-12 DIAGNOSIS — E113391 Type 2 diabetes mellitus with moderate nonproliferative diabetic retinopathy without macular edema, right eye: Secondary | ICD-10-CM | POA: Diagnosis not present

## 2021-01-12 DIAGNOSIS — H4312 Vitreous hemorrhage, left eye: Secondary | ICD-10-CM | POA: Diagnosis not present

## 2021-01-12 DIAGNOSIS — Z961 Presence of intraocular lens: Secondary | ICD-10-CM | POA: Diagnosis not present

## 2021-01-12 DIAGNOSIS — H33002 Unspecified retinal detachment with retinal break, left eye: Secondary | ICD-10-CM | POA: Diagnosis not present

## 2021-01-12 DIAGNOSIS — H2511 Age-related nuclear cataract, right eye: Secondary | ICD-10-CM | POA: Diagnosis not present

## 2021-01-12 DIAGNOSIS — H43821 Vitreomacular adhesion, right eye: Secondary | ICD-10-CM | POA: Diagnosis not present

## 2021-01-12 HISTORY — DX: Dizziness and giddiness: R42

## 2021-01-12 NOTE — Progress Notes (Signed)
Pt has received monitor, but family has expressed difficulty in understanding the instructions.  Can someone please reach out to pt or can pt have monitor placed in the office? Thanks [employer]  [phone]

## 2021-01-12 NOTE — Progress Notes (Signed)
CC: Dizziness; Syncope  HPI:  [name] is a 75 y.o. with a PMHx as listed below who presents to the clinic for Dizziness; Syncope.   Please see the Encounters tab for problem-based Assessment & Plan regarding status of patient's acute and chronic conditions.  Past Medical History:  Diagnosis Date   Anemia 10/18/2014   Baseline about 12 and stable from 2010 to 2016. Colon 2009 in [place] (records cannot be obtained).  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 Grade 2. EF 50-55%; echo repeated 2019, severe hypertrophy with elevated filling pressures   Chronic kidney disease, stage 3b ([place]) 03/09/2020   Dr. Justin Mend  Nephrologist, f/u Q 12M   Coronary artery disease without angina pectoris 04/04/2018   Degloving injury of finger 03/28/2020   10/21/19: copied from op note "1.  Open reduction percutaneous pinning left small finger proximal phalanx fracture 2.  Complex repair of laceration to left small finger 3 cm in length 3.  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 No significant callus formation is noted. Pins had been removed   Diverticulosis 10/08/2014   Seen on CT. Reportedly on Colon in [place] in 2009. Freq bouts of diverticulitis.   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 Stable from 2010 to 2016.   Negative SPEP and UPEP 2014 after ARF 2/2 continued ACE (lisinopril 20) use while vol contracted. 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 Failed CPAP on study. BiPA inspiratory pressure of 21 and expiratory pressure of 17 CWP. Consider ENT evaluation for potentially correctable upper airway obstruction contributing to the need for unusually high pressures - ENT July 2015 did not feel any intervention surgically was indicated. He wore a large F&P Simplus fullfac   Personal history of colonic polyps 03/28/2014   Dr. Benson Norway, 2 polyps removed 2015.  No polyps on f/u in 2020.  No further surveillance suggested per Dr. Benson Norway.   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  He is unable to breathe through his nose comfortably.  This is part of the reason why he does not wear his oxygen regularly.  Nasal passages are nearly completely obstructed erythematous smooth glistening surface.  He has been told by his eye doctor that part of the reason his e   Resistant hypertension 10/09/2014   Poor control with 6 drug therapy. 2016 : Aldo 37 but ARR 23.5    Severe pulmonary arterial systolic hypertension ([place]) 10/18/2014  Noted as severe ECHO 2016 and 2019. Likely 2/2 severe untreated OSA. Pt is not adherent to CPAP.   Thrombocytopenia ([place]) 10/09/2016   Nl liver and spleen on [place] 2019   Ulcer aphthous oral 04/27/2020   Evaluated emergency room last week, saw with Magic mouthwash   Review of Systems: Review of Systems  Constitutional:  Positive for diaphoresis (night time), malaise/fatigue and weight loss. Negative for chills and fever.  Eyes:        No acute vision changes  Respiratory:  Negative for cough, shortness of breath and wheezing.    Cardiovascular:  Positive for chest pain. Negative for palpitations and orthopnea.  Gastrointestinal:  Negative for abdominal pain, blood in stool, diarrhea, melena, nausea and vomiting.       Decreased appetite  Genitourinary:  Negative for dysuria, frequency and urgency.  Musculoskeletal:  Positive for falls. Negative for back pain, myalgias and neck pain.  Neurological:  Positive for dizziness, loss of consciousness, weakness and headaches. Negative for sensory change, speech change and focal weakness.   Physical Exam:  Vitals:   01/11/21 1544 01/11/21 1700  BP: 107/89 122/71  Pulse: 87 82  Temp: 98 F (36.7 C)   SpO2: 98%   Weight: 150 lb 4.8 oz (68.2 kg)    Physical Exam Vitals and nursing note reviewed.  Constitutional:      General: He is not in acute distress.    Appearance: He is normal weight.  HENT:     Head: Normocephalic and atraumatic.     Mouth/Throat:     Mouth: Mucous membranes are moist.     Pharynx: Oropharynx is clear.  Eyes:     General: No scleral icterus.    Extraocular Movements: Extraocular movements intact.     Right eye: Normal extraocular motion and no nystagmus.     Left eye: Normal extraocular motion and no nystagmus.     Pupils: Pupils are equal, round, and reactive to light.  Cardiovascular:     Rate and Rhythm: Normal rate. Rhythm irregular.     Heart sounds: No murmur heard. Pulmonary:     Effort: Pulmonary effort is normal. No respiratory distress.     Breath sounds: No wheezing, rhonchi or rales.  Abdominal:     General: Bowel sounds are normal.     Palpations: Abdomen is soft.  Skin:    General: Skin is warm and dry.     Findings: No rash.  Neurological:     Mental Status: He is alert and oriented to person, place, and time.     Cranial Nerves: No dysarthria or facial asymmetry.     Motor: Weakness (however no focal deficit) present.  Psychiatric:        Mood and Affect: Mood normal.        Speech: Speech normal.         Behavior: Behavior normal.    Assessment & Plan:   See Encounters Tab for problem based charting.  Patient discussed with [name]

## 2021-01-12 NOTE — Assessment & Plan Note (Addendum)
As noted above, patient has had a total of 3 syncopal episodes.  1 episode occurred when Dylan [name] was standing in the kitchen cooking; the second episode occurred when he was sitting down on the couch and was not moving around.  The most recent episode was within the last week after his discharge from the hospital; it occurred when he was walking in the hall back to his bedroom.  He states that dizziness occurs for just a few seconds before he syncopized.  Upon awakening, he is able to recognize what is going on immediately.  Denies any focal weakness upon awakening.  Assessment/plan: Due to patient's extensive cardiac history, differential diagnosis for syncope included arrhythmia.  During recent hospitalization, telemetry was remarkable for sinus arrhythmia, however no significant pauses were noted.  Today, patient is irregular on examination; EKGs were obtained.  Curbside consultation with cardiology pursued; cardiology noted EKGs were most consistent with 2nd degree AV block Type 1, sinus tachycardia, right bundle branch block with left axis deviation.  We have not captured a sinus or ventricular arrhythmia that could have resulted in syncope.  Due to this, a Holter monitor has been arranged.  We have been in contact with the outpatient cardiology office in order to assist patient in setting up the Holter monitor.  We are hopeful to receive further instructions on Monday regarding this.  During hospitalization, patient had a repeat echo that showed severe LVH and moderate pleural effusion, however there was no evidence of ventricular obliteration or tamponade.  There may be an aspect of deposition cardiomyopathy, however unlikely to be contributing to current picture.  There have been no metabolic etiologies identified that may be contributing to patient's syncope.  - Cardiology appointment in August  - Holter monitor for 30 days - 2-week follow-up

## 2021-01-12 NOTE — Assessment & Plan Note (Signed)
Patient continues to have hypotension with lying down, however becomes hypotensive upon standing.  Due to this all antihypertensives have been discontinued.    - If patient begins to put weight back on, we will need to monitor for increases in blood pressure

## 2021-01-12 NOTE — Assessment & Plan Note (Addendum)
Mr. Quillin continues to endorse dizziness.  These instances occur both when he is walking or standing in the kitchen, in addition to upon sitting up and/or standing.  If the dizziness occurs when he is already standing OR already sitting, it comes on randomly and self resolves.  If it occurs while trying to stand up, he often has to sit back down immediately due to fear of falling.  He notes that dizziness started to occur around the same time as weight loss.  He denies any dizziness when changing position in bed nor has he found a certain position that resolves his dizziness.  Mr. Yabut denies any nausea, vomiting, palpitations, abdominal pain, diaphoresis that occurs at the same time.  Occasionally, dizziness has been followed by syncopal episodes, a total of 3 times.  1 episode occurred when Mr. Slayden was standing in the kitchen cooking; the second episode occurred when he was sitting down on the couch and was not moving around.  The most recent episode was within the last week after his discharge from the hospital; it occurred when he was walking in the hall back to his bedroom.  He states that dizziness occurs for just a few seconds before he syncopized.    Assessment/plan: Mr. Armor has a several month history of dizziness that has been ongoing in the setting of drastic weight loss.  Previously on several antihypertensives that have slowly been titrated down and discontinued completely.  During recent office visits and subsequent recent hospitalization for syncope, patient was found to have positive orthostatics.  During office visit today, orthostatics continued to be positive with approximately 49 point drop in systolic blood pressure despite not being on any antihypertensives for the last several days.  Orthostatis explains his dizziness that occurs with positional changes, however does not explain his dizziness when he is already been up and walking or just standing in the kitchen.  There may be an  aspect of autonomic dysfunction from longstanding diabetes.  Given that symptoms have occurred with syncope, cardiovascular etiologies have been investigated.  Of course, his 50 pound weight loss is likely playing a role as I suspect his body has not been able to compensate.  - Discontinue all antihypertensives - Encouraged increased water intake  - Please see separate assessment and plan of syncope

## 2021-01-12 NOTE — Assessment & Plan Note (Addendum)
Mr. Clayburn states that he continues to lose weight despite eating more than he used to.  He notes that his appetite is doing well and he has even increased his food range to include [place].  He denies any cold or heat intolerance, melena, hematochezia, cough.  Assessment/plan: 09/14/2020: 201.9 pounds 01/11/2021: 150.3 pounds  Mr. Deisher has had a drastic 50 pound weight loss over the last 4 months.  Initially, differential diagnosis included medication side effect from GLP-1 agonist Trulicity.  Patient has been on this medication since 2020, however the dose has been titrated down over the past 1.5 years.  The last dose he had been on in the last few months is 0.75 mg.  Although this dose is not associated with weight loss, it could still be contributing at this point.  Work-up thus far has been extensive including evaluation for hypermetabolic state versus malignancy. I have listed below the relevant studies:  - Borderline low TSH with mildly elevated free T4, normal T3 in June 2022.  Per chart review, patient had a thyrotropin receptor antibody evaluation in 2016 that was negative. No evidence of hyper or hypo thyroidism at this time. - PSA negative at 1.7 (May 2022). Last colonoscopy was in August 2020 for evaluation of hematochezia that showed widemouth diverticula without diverticulitis.  No polyps resected at that time.  Patient had a colonoscopy in 2015 at which time he had 2 polyps resected that were both negative. - CT chest/abdomen/pelvis (June 2022) with contrast for evaluation of malignancy; results were negative. - Protein electrophoresis negative in May 2022  Other considerations include pancreatic insufficiency, at which patient is at increased risk due to longstanding type 2 diabetes.  Although this would be unusual at such a late age, malabsorption from bowel disease remains on the differential.  Additionally, adrenal system may be playing a role, particularly adrenal  insufficiency.  HIV and hepatitis C evaluated in 2016 and negative.  - Discontinued Trulicity - Encouraged continued frequent p.o. intake, however cautioned to avoid sugary foods given recent history of hyperglycemic crisis - Consider obtaining ESR, CRP at next visit to evaluate for inflammatory process or occult malignancy - Consider fecal elastase to evaluate for pancreatic insufficiency - Consider morning cortisol and ACTH to evaluate for adrenal insufficiency

## 2021-01-12 NOTE — Assessment & Plan Note (Signed)
[name] states that he continues to take his Trulicity as he has been nervous that stopping it would cause a hyperglycemic crisis as he had back in May.  We discussed that his hyperglycemic crisis seem to be triggered by prednisone use.  He continues to check his CBGs twice daily, and often they are in the 100s but he has had a few readings above 200.  Assessment/plan: Lab Results  Component Value Date   HGBA1C 6.4 (A) 12/14/2020   Given significant weight loss will discontinue Trulicity at this time.  A1c is within goal so unlikely to need aggressive antiglycemic regimen.  - Discontinue Trulicity - Start metformin 500 mg once daily

## 2021-01-17 ENCOUNTER — Encounter: Payer: 59 | Admitting: Student

## 2021-01-22 ENCOUNTER — Telehealth: Payer: Self-pay | Admitting: Cardiovascular Disease

## 2021-01-22 NOTE — Telephone Encounter (Signed)
Pt is calling to confirm when he can get his heart monitor applied.He states he has been waiting for a call for awhile

## 2021-01-22 NOTE — Telephone Encounter (Signed)
Attempted to return call. LMVM , please call Sham Alviar in [employer] at [phone].

## 2021-01-22 NOTE — Progress Notes (Signed)
[facility] Clinic Attending  Case discussed with Dr. Charleen Kirks  At the time of the visit.  We reviewed the resident's history and exam and pertinent patient test results.  I agree with the assessment, diagnosis, and plan of care documented in the resident's note. Dylan [name] is well known to me.  Dr. Charleen Kirks has encouraged him to completely stop the Trulicity (he has continued to take it long after it was discontinued, as he was concerned about his blood sugar), thus difficulty in assessing ongoing weight loss.  Reinforced necessity of stopping it, to be replaced with metformin (though he really doesn't need a hypoglycemic agent at this time given his overly controlled A1C ,which places him at high risk of adverse event; metformin should not cause a hypoglycemic episode).  I will see him in f/u in 2 wks.

## 2021-01-22 NOTE — Telephone Encounter (Signed)
Contacted patient son, they stated they received the monitor but had difficulties on applying this and wanted assistance, they would like someone to place on for them. I will reach out to our ladies who help with the monitors to see if this is something they can assist in. Patient son will call back tomorrow if he does not get a call back today.  Patient son verbalized understanding. He was frustrated and I advised I would do all I could do to help him.  Thanks!

## 2021-01-23 NOTE — Telephone Encounter (Signed)
Patient's son is returning call. Call successfully transferred.

## 2021-01-23 NOTE — Telephone Encounter (Signed)
Patient scheduled to have monitor applied at [facility] office , Thursday , 01/25/21 , at 9:30AM.

## 2021-01-24 NOTE — Progress Notes (Signed)
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  He states that he is doing well except for the positional dizziness he continues to experience when he is upright.  He feels as though he may faint, but quickly sits until the feeling passes.  He has sustained no further loss of consciousness and no falls since visit 2 weeks ago.  Metformin 500 mg daily prescribed 2 weeks ago which he is tolerating well.  He reports that his blood sugars have been doing quite well.  His appetite is voracious.  He has not noted any weight change since 2 weeks ago.  Just this morning his cardiac monitor was placed in the cardiology office.  He has a follow-up later in August with a cardiologist.  Dylan [name] is experienced no sense of palpitations or chest discomfort.  He has had no difficulty breathing, and as of about 3 months ago has not required supplemental oxygen at home as his saturations have been greater than 90% as checked on home pulse ox.    Reports that several mornings per month, he awakens to experience pain in the left dorsal midfoot upon stepping down.  He has noted that his left foot seems to be more swollen compared to the right.  He questions why this discomfort is so episodic, as he suspects it is due to gout.  BP (!) 176/87 (BP Location: Right Arm, Cuff Size: Small)   Pulse (!) 53   Temp 98 F (36.7 C) (Oral)   Ht _0  (1.676 m)   Wt 153 lb (69.4 kg)   SpO2 100%   BMI 24.69 kg/m   Dylan [name] appears well (bright, conversing, no distress notable), though his low weight remains striking.  His chronic left proptosis is not as apparent following weight loss.  There is no JVD in sitting position.  Lungs are clear.  Heart is irregularly irregular today, which is new for him as he has had historically NSR.  He does not  perceive symptomatically.  Lower extremities are slender with decreased muscle mass.  Left foot is puffy without pitting and mild tenderness is localized across the top of the midfoot, no erythema or warmth.  Toenails are in good repair.  Dorsalis pedis pulses cannot be palpated.  Feet are warm, toes with good capillary refill.  Skin turgor is decreased generally.  No foot wounds or vulnerable areas.  Assessment/plan: (See also problem based charting)  3 pound weight gain in the past 2 weeks is encouraging and further supports the GLP agonist as the source of his weight loss, though we will continue to monitor him closely.  He has had a extensive evaluation and no malignancy or thyroid etiology has been identified.  He maintains a good appetite and I encouraged him to liberalize his diet.  After period of hypotension requiring cessation of all antihypertensives, he is again hypertensive with abnormal positional chronotropic response on orthostatic evaluation.   Difficult balance to strike, as he continues to feel positional dizziness upon standing, but also has significantly elevated BP, particularly DBP.  We will initiate amlodipine 5 mg daily.  See other conditions addressed in problem list.  I will f/u and call about HHA referral which was to have been placed at last visit.

## 2021-01-25 ENCOUNTER — Encounter (INDEPENDENT_AMBULATORY_CARE_PROVIDER_SITE_OTHER): Payer: Self-pay

## 2021-01-25 ENCOUNTER — Ambulatory Visit (INDEPENDENT_AMBULATORY_CARE_PROVIDER_SITE_OTHER): Payer: Medicare Other | Admitting: Internal Medicine

## 2021-01-25 ENCOUNTER — Ambulatory Visit (INDEPENDENT_AMBULATORY_CARE_PROVIDER_SITE_OTHER): Payer: Medicare Other

## 2021-01-25 ENCOUNTER — Encounter: Payer: Self-pay | Admitting: Internal Medicine

## 2021-01-25 ENCOUNTER — Telehealth: Payer: Self-pay | Admitting: Cardiovascular Disease

## 2021-01-25 ENCOUNTER — Other Ambulatory Visit: Payer: Self-pay

## 2021-01-25 VITALS — BP 176/87 | HR 53 | Temp 98.0°F | Ht 66.0 in | Wt 153.0 lb

## 2021-01-25 DIAGNOSIS — I70213 Atherosclerosis of native arteries of extremities with intermittent claudication, bilateral legs: Secondary | ICD-10-CM | POA: Diagnosis not present

## 2021-01-25 DIAGNOSIS — H0520 Unspecified exophthalmos: Secondary | ICD-10-CM

## 2021-01-25 DIAGNOSIS — E66811 Obesity, class 1: Secondary | ICD-10-CM

## 2021-01-25 DIAGNOSIS — G4733 Obstructive sleep apnea (adult) (pediatric): Secondary | ICD-10-CM | POA: Diagnosis not present

## 2021-01-25 DIAGNOSIS — I422 Other hypertrophic cardiomyopathy: Secondary | ICD-10-CM

## 2021-01-25 DIAGNOSIS — I700 Atherosclerosis of aorta: Secondary | ICD-10-CM | POA: Diagnosis not present

## 2021-01-25 DIAGNOSIS — I10 Essential (primary) hypertension: Secondary | ICD-10-CM

## 2021-01-25 DIAGNOSIS — R42 Dizziness and giddiness: Secondary | ICD-10-CM

## 2021-01-25 DIAGNOSIS — N2581 Secondary hyperparathyroidism of renal origin: Secondary | ICD-10-CM

## 2021-01-25 DIAGNOSIS — N1831 Chronic kidney disease, stage 3a: Secondary | ICD-10-CM | POA: Diagnosis not present

## 2021-01-25 DIAGNOSIS — M1A3790 Chronic gout due to renal impairment, unspecified ankle and foot, without tophus (tophi): Secondary | ICD-10-CM

## 2021-01-25 DIAGNOSIS — I129 Hypertensive chronic kidney disease with stage 1 through stage 4 chronic kidney disease, or unspecified chronic kidney disease: Secondary | ICD-10-CM

## 2021-01-25 DIAGNOSIS — I5032 Chronic diastolic (congestive) heart failure: Secondary | ICD-10-CM

## 2021-01-25 DIAGNOSIS — E1122 Type 2 diabetes mellitus with diabetic chronic kidney disease: Secondary | ICD-10-CM

## 2021-01-25 DIAGNOSIS — I130 Hypertensive heart and chronic kidney disease with heart failure and stage 1 through stage 4 chronic kidney disease, or unspecified chronic kidney disease: Secondary | ICD-10-CM | POA: Diagnosis not present

## 2021-01-25 DIAGNOSIS — R0600 Dyspnea, unspecified: Secondary | ICD-10-CM

## 2021-01-25 DIAGNOSIS — I2510 Atherosclerotic heart disease of native coronary artery without angina pectoris: Secondary | ICD-10-CM | POA: Diagnosis not present

## 2021-01-25 DIAGNOSIS — E669 Obesity, unspecified: Secondary | ICD-10-CM

## 2021-01-25 DIAGNOSIS — R634 Abnormal weight loss: Secondary | ICD-10-CM

## 2021-01-25 DIAGNOSIS — I1A0 Resistant hypertension: Secondary | ICD-10-CM

## 2021-01-25 DIAGNOSIS — R55 Syncope and collapse: Secondary | ICD-10-CM | POA: Diagnosis not present

## 2021-01-25 DIAGNOSIS — E1121 Type 2 diabetes mellitus with diabetic nephropathy: Secondary | ICD-10-CM

## 2021-01-25 DIAGNOSIS — I951 Orthostatic hypotension: Secondary | ICD-10-CM | POA: Diagnosis not present

## 2021-01-25 DIAGNOSIS — R0609 Other forms of dyspnea: Secondary | ICD-10-CM

## 2021-01-25 MED ORDER — DILTIAZEM HCL ER 60 MG PO CP12: 60.0000 mg | ORAL_CAPSULE | Freq: Two times a day (BID) | ORAL | 11 refills | Status: DC

## 2021-01-25 MED ORDER — AMLODIPINE BESYLATE 5 MG PO TABS: 5.0000 mg | ORAL_TABLET | Freq: Every day | ORAL | 11 refills | Status: DC

## 2021-01-25 NOTE — Assessment & Plan Note (Signed)
FInally stopped Trulicity 2 wks ago (he had been nervous to stop) to address weight loss, replaced with  metformin 500 mg daily with no plan to increase dose unless hyperglycemia becomes a problem.  He is tightly controlled, recheck in 60M.

## 2021-01-25 NOTE — Assessment & Plan Note (Signed)
Resolved with weight loss.

## 2021-01-25 NOTE — Telephone Encounter (Signed)
Spoke with Kathlee Nations at [employer] she has a critical value/1st AFIB. HR 100-110. Tried to call pt,x2  he had no VM set up. Will try again later. Showed DOD-Dr Gardiner Rhyme he said this ok.will send report to be scanned.

## 2021-01-25 NOTE — Assessment & Plan Note (Signed)
WHile there is a positional decline in BP, his HR does not respond appropriately and he remains hypertensive in standing position.  DBP particularly high - will carefully initiate amlodipine 5 mg daily.  He is aware of sense of lightheadedness requiring sitting down for safety.

## 2021-01-25 NOTE — Progress Notes (Unsigned)
Monitor mailed 01/02/21 and applied in office 01/25/21.

## 2021-01-25 NOTE — Assessment & Plan Note (Signed)
Proptosis is much less evident now that he has lost weight.  No intervention needed.

## 2021-01-25 NOTE — Assessment & Plan Note (Signed)
Resolved, significant weight loss BMI now 02.77, Trulicity suspected culprit, no malignancy evident.

## 2021-01-25 NOTE — Assessment & Plan Note (Signed)
No anginal symptoms. Aspirin resumed last Fall (he had stopped due to concerns about safety from the news). Risk factors managed, though BP has been fluctuating.

## 2021-01-25 NOTE — Assessment & Plan Note (Signed)
Significant LVH persists on echo last month; infiltrative process is a consideration.  No signs or symptoms of heart failure today. Monitor.

## 2021-01-25 NOTE — Assessment & Plan Note (Signed)
Compensated, on no medications as this time.  SEe separate entry under hypertensive heart disease.  Echo updated Summer 2022.

## 2021-01-25 NOTE — Assessment & Plan Note (Signed)
L > R midfoot pain is always present at least to a mild degree, and on occasion he has more severe pain upon arising and bearing weight in the morning.  No acute flares. The midfoot pain has been ascribed to gout historically.  BAck on allopurinol.  Dr. Justin Mend has suggested colchicine as acute therapy if needed (from renal perspective).

## 2021-01-25 NOTE — Assessment & Plan Note (Signed)
Last appt with Dr. Justin Mend 12/06/20 who noted stability of renal fxn.  Last BMP 01/02/21 showed eGFR > 60!

## 2021-01-25 NOTE — Assessment & Plan Note (Signed)
Persists when upright, not described as vertigo - feels lightheaded, despite improvement in BP (in fact, hypertensive, though HR doesn't increase appropriately).  Continue with rhythm evaluation.

## 2021-01-25 NOTE — Assessment & Plan Note (Signed)
Has not been treated as not a good candidate for CPAP (has chronic nasal obstruction).  No right heart consequences on recent echo, no JVD or LE edema on exam.  NO longer wears oxygen as hypoxia resolved with weight loss.

## 2021-01-25 NOTE — Assessment & Plan Note (Signed)
Feet remain warm with good CR, though pulseless.  He has had no claudication for many many months (activity limited, however). Risk factors managed, though BP has recently fluctuated.

## 2021-01-25 NOTE — Assessment & Plan Note (Signed)
3# weight gain x 2 wks, voracious appetite! Trulicity stopped 2 wks ago (was discontinued some time ago though patient continued to take it).  No evidence of malignancy. Monitor.

## 2021-01-25 NOTE — Assessment & Plan Note (Signed)
Risk factors managed.  See entry for PVD/PAD.

## 2021-01-25 NOTE — Telephone Encounter (Signed)
liz from [employer] critical ekg notice

## 2021-01-25 NOTE — Patient Instructions (Signed)
YOu gained a bit of weight!  Keep up the good work.  Your diabetes is very very well controlled.  Be careful with the amount of meat you eat.  You may notice that eating a lot causes your gout to flare.  Pay attention to how you feel.  New blood pressure medicine to be started - start with taking it once a day for a week then increase to twice a week if you continue to feel ok.  See you in about 4 weeks!  [name]

## 2021-01-25 NOTE — Assessment & Plan Note (Signed)
Orthostatic hypotension has resolved; he now remains hypertensive even with standing off of all antihypertensives.  Continues to feel dizziness/lightheadedness when upright, for which he sits to allow feeling to resolve.  See entry for hypertension.

## 2021-01-25 NOTE — Assessment & Plan Note (Signed)
No further syncopal episodes in past 2 wks, though he continues to have dizziness upon standing.  Heart monitor was placed this morning, cardiology appt next month. He is encouraged to maintain hydration (skin turgor is reduced, not on diuretic) especially during hot weather.

## 2021-01-26 NOTE — Telephone Encounter (Signed)
Patient has CHADSVASc score of at least 6.  Consider starting anticoagulation?

## 2021-01-29 NOTE — Telephone Encounter (Signed)
Attempted to contact patient to offer appointment after receiving critical event again on day 4. Unable to leave a voicemail, will try again later.

## 2021-01-30 ENCOUNTER — Telehealth: Payer: Self-pay | Admitting: Cardiovascular Disease

## 2021-01-30 NOTE — Telephone Encounter (Signed)
Spoke with Rollene Fare at [employer] she is calling to notify pt has a critical report pt had auto-trigger AFIB w/RVR HR 150-160. She tried to call pt _0  and pt did not answer.  Dr O'Neil's nurse has been unable to contact pt either. Tried to call daughter Michall Noffke on cell number. LMVM. Tried to call pt at number listed, pt does not have VM set up.   Please transfer pt call to triage immediately if pt/daughter calls back.

## 2021-01-30 NOTE — Telephone Encounter (Signed)
Received call from [employer] with critical monitor alert:  Today at 953 AM central Afib HR 170 auto detected No follow up strip at this time  Spoke to patient, reports feeling dizziness that comes and goes, daily.  No SOB, no syncope.   Reports once he gets dizzy he sits down.   Episodes last approximately 10 mins. Denies symptoms at current.    Appt scheduled tomorrow at 9 AM with Dr. Audie Box.  Patient will need to verify with his son for transportation. Advised to call back if unable to come so we can reschedule.     Strips printed for MD to review.

## 2021-01-30 NOTE — Telephone Encounter (Signed)
Attempt to return call to son-no answer and unable to leave VM (VM not set up)

## 2021-01-30 NOTE — Telephone Encounter (Signed)
FYI--Patient's son called back to confirm appointment for tomorrow is alright (see below). He plans to have the patient at the office.

## 2021-01-30 NOTE — Telephone Encounter (Signed)
Dylan [name] is calling with a critical EKG result

## 2021-01-30 NOTE — Telephone Encounter (Signed)
Dylan [name] from [employer] with heart monitor reading.

## 2021-01-30 NOTE — Telephone Encounter (Signed)
Will have MD review, I have attempted again to get patient in for an appointment this week unable to reach patient to offer an appointment, will try again.

## 2021-01-30 NOTE — Progress Notes (Signed)
Cardiology Office Note:   Date:  01/31/2021  NAME:  Dylan [name]    MRN: [id number] DOB:  January 06, 1946   PCP:  Angelica Pou, MD  Cardiologist:  None  Electrophysiologist:  None   Referring MD: Angelica Pou, MD   Chief Complaint  Patient presents with   New Patient (Initial Visit)    Monitor.   Headache   Edema    History of Present Illness:   Dylan [name] is a 75 y.o. male with a hx of DM, HTN, CKD3, tobacco abuse, HFpEF, pulmonary hypertension who is being seen today for the evaluation of atrial fibrillation at the request of Angelica Pou, MD. Admitted to the hospital 12/31/2020-01/02/2021 for recurrent syncope attributed to orthostatic hypotension. He has had 40 lb weight loss on trulicity and several medications were stopped.   He was sent home with a monitor that shows episodes of atrial fibrillation. Echo shows EF 50% with severe concentric LVH.   I have reviewed his monitor.  He is in rapid A. fib mostly.  EKG today confirms he is in A. fib.  No prior history of this.  He is unaware of his heart racing.  He reports that he has had profound dizziness and lightheadedness upon standing.  Has had several syncopal episodes and was admitted as detailed above.  He was actually taken off blood pressure medications.  He continues to have symptoms of orthostasis.  We discussed compression stockings as well as possibly stopping his amlodipine.  He reports he would not like to do this until he talks with his primary care physician.  I think A. fib is likely exacerbating his symptoms and we discussed starting medications for his heart rate down.  There is a bit of a disconnect with what we are trying to do between the patient's I myself.  I did inform him that it is likely to make him feel better but I cannot assure this as I do believe his orthostasis is quite profound.  They are quite upset that this will not fix his problem.  We also discussed Eliquis to prevent stroke.  They  are interested in this.  We will continue this.  I think he also needs to have a PYP scan to exclude cardiac amyloidosis.  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  This would help explain why he has such profound orthostasis.  He appears to have a constellation of supine hypertension with orthostatic hypotension.  I do wonder if neurologic evaluation is needed.  He is a former smoker.  He does not drink alcohol or use drugs.  He has diabetes as well as hypertension which she has had for years.  He has never had a heart attack or stroke.  He thinks his brother may have enlarged heart as well.  He does have sleep apnea but is noncompliant with his CPAP machine.  He presents with his son.  He lives with his son.  He requires assistance with several activities of daily living.  He has no evidence of lower extreme edema.  There is no evidence of volume overload today on exam.  Problem List  Diabetes -A1c 6.4 2. HTN 3. CKD 3 4. Prior tobacco abuse -20 pack years  5. Pulmonary hypertension 6. OSA 7. HFpEF -Hypertensive heart disease  8. RBBB/1AVB/Wenckebach 9. Atrial fibrillation, persistent   Past Medical History: Past Medical History:  Diagnosis Date   Anemia 10/18/2014   Baseline  about 12 and stable from 2010 to 2016. Colon 2009 in [place] (records cannot be obtained).  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 Grade 2. EF 50-55%; echo repeated 2019, severe hypertrophy with elevated filling pressures   Chronic kidney  disease, stage 3b ([place]) 03/09/2020   Dr. Justin Mend  Nephrologist, f/u Q 28M   Degloving injury of finger 03/28/2020   10/21/19: copied from op note "1.  Open reduction percutaneous pinning left small finger proximal phalanx fracture 2.  Complex repair of laceration to left small finger 3 cm in length 3.  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 No significant callus formation is noted. Pins had been removed   Diverticulosis 10/08/2014   Seen on CT. Reportedly on Colon in [place] in 2009. Freq bouts of diverticulitis.   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 Stable from 2010 to 2016.  Negative SPEP and UPEP 2014 after ARF 2/2 continued ACE (lisinopril 20) use while vol contracted. 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 Failed CPAP on study. BiPA inspiratory pressure of 21 and expiratory pressure of 17 CWP. Consider ENT evaluation for potentially correctable upper airway obstruction contributing to the need for unusually high pressures - ENT July 2015 did not feel any intervention surgically was indicated. He wore a large F&P Simplus fullfac   Personal history of colonic polyps 03/28/2014   Dr. Benson Norway, 2 polyps removed 2015.  No polyps on f/u in 2020.  No further surveillance suggested per Dr. Benson Norway.   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  He is unable to breathe through his nose comfortably.  This  is part of the reason why he does not wear his oxygen regularly.  Nasal passages are nearly completely obstructed erythematous smooth glistening surface.  He has been told by his eye doctor that part of the reason his e   Resistant hypertension 10/09/2014   Poor control with 6 drug therapy. 2016 : Aldo 37 but ARR 23.5    Severe pulmonary arterial systolic hypertension ([place]) 10/18/2014   Noted as severe ECHO 2016 and 2019. Likely 2/2 severe untreated OSA. Pt is not adherent to CPAP.   Thrombocytopenia ([place]) 10/09/2016   Nl liver and spleen on [place] 2019   Ulcer aphthous oral 04/27/2020   Evaluated emergency room last week, saw with Magic mouthwash    Past Surgical History: Past Surgical History:  Procedure Laterality Date  CHOLECYSTECTOMY     COLONOSCOPY WITH PROPOFOL N/A 02/12/2019   Procedure: COLONOSCOPY WITH PROPOFOL;  Surgeon: Carol Ada, MD;  Location: [facility];  Service: Endoscopy;  Laterality: N/A;   PERCUTANEOUS PINNING Left 10/21/2019   Procedure: CLOSED REDUCTION WITH PERCUTANEOUS PINNING AND SUTURE REPAIR OF LACERATION  OF SMALL FINGER,  SUTURE REPAIR OF LACERATION OF INDEX FINGER;  Surgeon: Cindra Presume, MD;  Location: [facility];  Service: Plastics;  Laterality: Left;    Current Medications: Current Meds  Medication Sig   allopurinol (ZYLOPRIM) 100 MG tablet Take 1 tablet (100 mg total) by mouth daily.   amLODipine (NORVASC) 5 MG tablet Take 1 tablet (5 mg total) by mouth daily.   apixaban (ELIQUIS) 5 MG TABS tablet Take 1 tablet (5 mg total) by mouth 2 (two) times daily.   atorvastatin (LIPITOR) 40 MG tablet Take 1 tablet (40 mg total) by mouth daily.   carboxymethylcellulose (REFRESH PLUS) 0.5 % SOLN Place 1 drop into both eyes 3 (three) times daily as needed (dry eyes).   cycloSPORINE (RESTASIS) 0.05 % ophthalmic emulsion Place 1 drop into both eyes 2 (two) times daily.   FREESTYLE LITE test strip Use to check blood sugar 3 times daily, DIAG CODE E11.29, INSULIN  DEPENDENT   metFORMIN (GLUCOPHAGE) 500 MG tablet Take 1 tablet (500 mg total) by mouth daily with breakfast.   metoprolol tartrate (LOPRESSOR) 25 MG tablet Take 1 tablet (25 mg total) by mouth 2 (two) times daily.   polyethylene glycol (MIRALAX / GLYCOLAX) packet Take 17 g by mouth daily as needed (constipation).    [DISCONTINUED] aspirin 81 MG EC tablet Take 81 mg by mouth daily. Swallow whole.     Allergies:    Ace inhibitors and Spironolactone   Social History: Social History   Socioeconomic History   Marital status: Divorced    Spouse name: Not on file   Number of children: Not on file   Years of education: Not on file   Highest education level: Not on file  Occupational History   Not on file  Tobacco Use   Smoking status: Former    Years: 20.00    Types: Cigarettes    Quit date: 10/02/1972    Years since quitting: 48.3   Smokeless tobacco: Never  Vaping Use   Vaping Use: Never used  Substance and Sexual Activity   Alcohol use: No    Alcohol/week: 0.0 standard drinks   Drug use: No   Sexual activity: Never  Other Topics Concern   Not on file  Social History Narrative   Worked in Civil engineer, 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 Had yearly occupational testing inc PFT's. Now retired. Divorced. Has male sig other. Likes to go fishing.      He starting drinking as a teen and would drink to passing out. Couldn't keep job, have family. He quit drinking and smoking cold [place] with help of his faith. No ETOH since about age 34ish      Total of 7 brothers and 6 sisters.   Social Determinants of Health   Financial Resource Strain: Not on file  Food Insecurity: Not on file  Transportation Needs: Not on file  Physical Activity: Not on file  Stress: Not on file  Social Connections: Not on file     Family History: The patient's family history includes CAD in his sister; Heart failure in his sister.  ROS:   All other ROS reviewed and negative. Pertinent positives noted in the HPI.  EKGs/Labs/Other Studies Reviewed:   The following studies were personally reviewed by me today:  EKG:  EKG is ordered today.  The ekg ordered today demonstrates atrial fibrillation heart rate 116, brief sinus tachycardia, A. fib appears to be paroxysmal and EKG today, and was personally reviewed by me.   TTE 01/01/2021  1. Left ventricular ejection fraction, by estimation, is 50%. The left  ventricle has mildly decreased function. The left ventricle demonstrates  global hypokinesis. There is severe concentric left ventricular  hypertrophy. Left ventricular diastolic  parameters are indeterminate. Regional strain foreshortened and suboptimal  in nature.   2. A moderate pericardial effusion is present. The pericardial effusion  is circumferential. There is no evidence of increased pericardial  pressures.   3. Right ventricular systolic function is normal. The right ventricular  size is normal.   4. Left atrial size was moderately dilated.   5. The mitral valve is normal in structure. No evidence of mitral valve  regurgitation. No evidence of mitral stenosis.   6. The aortic valve is tricuspid. There is moderate calcification of the  aortic valve. Aortic valve regurgitation is not visualized. Mild aortic  valve sclerosis is present, with no evidence of aortic valve stenosis.  Recent Labs: 12/31/2020: ALT 20; B Natriuretic Peptide 855.9; Hemoglobin 13.1; Magnesium 2.0; Platelets 346; TSH 0.677 01/02/2021: BUN 22; Creatinine, Ser 1.21; Potassium 4.0; Sodium 138   Recent Lipid Panel    Component Value Date/Time   CHOL 125 09/14/2020 1005   TRIG 57 09/14/2020 1005   HDL 38 (L) 09/14/2020 1005   CHOLHDL 3.3 09/14/2020 1005   CHOLHDL 4.6 01/12/2015 1032   VLDL 17 01/12/2015 1032   Donald 75 09/14/2020 1005    Physical Exam:   VS:  BP 128/88   Pulse (!) 116   Ht _0  (1.676 m)   Wt 149 lb (67.6 kg)   BMI 24.05 kg/m    Wt Readings from Last 3 Encounters:  01/31/21 149 lb (67.6  kg)  01/25/21 153 lb (69.4 kg)  01/11/21 150 lb 4.8 oz (68.2 kg)    General: Well nourished, well developed, in no acute distress Head: Atraumatic, normal size  Eyes: PEERLA, EOMI  Neck: Supple, no JVD Endocrine: No thryomegaly Cardiac: Normal S1, S2; irregular rhythm, no murmurs rubs or gallops Lungs: Clear to auscultation bilaterally, no wheezing, rhonchi or rales  Abd: Soft, nontender, no hepatomegaly  Ext: No edema, pulses 2+ Musculoskeletal: No deformities, BUE and BLE strength normal and equal Skin: Warm and dry, no rashes   Neuro: Alert and oriented to person, place, time, and situation, CNII-XII grossly intact, no focal deficits  Psych: Normal mood and affect   ASSESSMENT:   Dylan [name] is a 75 y.o. male who presents for the following: 1. Chronic diastolic heart failure ([place])   2. Orthostatic hypotension   3. Persistent atrial fibrillation ([place])   4. Primary hypertension   5. Mixed hyperlipidemia     PLAN:   1. Chronic diastolic heart failure ([place]) 2. Orthostatic hypotension -He presents with a constellation of symptoms including dizziness upon standing.  He was profoundly orthostatic when admitted to the hospital.  He has now been found to have severe orthostatic hypotension.  He has severe concentric LVH.  BP has been uncontrolled for years.  I did look at his echocardiogram and his strain pattern is concerning for apical sparing which could possibly represent cardiac amyloidosis.  I think this would explain why he has had profound issues with  change in blood pressure upon standing.  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  I think amlodipine could be contributing to his symptoms.  He does not appear volume overloaded at all and is losing weight.  Despite this troponin is minimally elevated and BNP is elevated as well.  This further points to possible infiltrative cardiac amyloidosis.  We will obtain serum and urine immunofixation.  We will  also chain light chain ratio from serum studies.  I have ordered a PYP scan however this will take months to complete due to the PYP shortage.  Hopefully we get this done soon. -In the interim I have recommended compression stockings.  I think amlodipine may be exacerbating his symptoms due to peripheral vasodilation.  I will reach out to his primary care physician to discuss this. - he is not drinking enough water.  I recommended he drink more water.  I have also recommended compression stockings. -Normal thyroid studies.  He may need neurological work-up.  This could be neurogenic orthostatic hypotension. -In the interim I have also encouraged to lessen his change in position.  He should sleep upright at 45 degrees.  He also should avoid abrupt changes in position. -He also has been found to have atrial fibrillation.  This could be contributing to his symptoms but not the full explanation.  I have recommended metoprolol tartrate 25 twice daily for better rate control.  He is asymptomatic from his A. fib and I think he just needs better rate control. -He does have a long history of OSA as well as pulmonary hypertension.  Most recent echo shows normal pulmonary pressures.  This is been attributed to a mixture of diastolic heart failure and untreated sleep apnea.  Unfortunately he continues to not wear his CPAP machine.  3. Persistent atrial fibrillation ([name]) -CHADSVASC=4 (age, DM, HTN).  I recommended start Eliquis 5 mg twice daily. -His rates are uncontrolled.  I have reviewed the preventives monitor.  Rates up into the 160s 170s at times.  I think this is exacerbating his symptoms of orthostatic hypotension.  I recommend he start metoprolol tartrate 25 twice daily.  I think this is going to help him but may not resolve all of the symptoms. -Pending on his PYP scan he may end up needing a rhythm control strategy.  Unfortunately he is not active and has nearly 40 to 50 pound weight loss.  This has not  been resolved completely.  I will reach out to his primary care physician about this.  I am reluctant to pursue aggressive care at this time until this is elucidated.  He continues to lose weight per his report.  4. Primary hypertension -Continue amlodipine for now.  This may need to be stopped as this could be contributing to his orthostatic hypotension.  I will reach out to his primary care physician.  Disposition: Return in about 2 months (around 04/03/2021).  Medication Adjustments/Labs and Tests Ordered: Current medicines are reviewed at length with the patient today.  Concerns regarding medicines are outlined above.  Orders Placed This Encounter  Procedures   PE and FLC, Serum   Immunofixation, urine   Immunofixation (IFE), Serum   MYOCARDIAL AMYLOID IMAGING PLANAR AND SPECT   EKG 12-Lead   Meds ordered this encounter  Medications   metoprolol tartrate (LOPRESSOR) 25 MG tablet    Sig: Take 1 tablet (25 mg total) by mouth 2 (two) times daily.    Dispense:  180 tablet    Refill:  3   apixaban (ELIQUIS) 5 MG TABS tablet    Sig: Take 1 tablet (5 mg total) by mouth 2 (two) times daily.    Dispense:  60 tablet    Refill:  2    Patient Instructions  Medication Instructions:  Start Metoprolol Tartrate 25 mg twice daily  Start Eliquis 5 mg twice daily  Stop Aspirin  *If you need a refill on your cardiac medications before your next appointment, please call your pharmacy*   Lab Work: Immunofixation urine, IFE, PE and FLC today   If you have labs (blood work) drawn today and your tests are completely normal, you will receive your results only by: [place] (if you have MyChart) OR A paper copy in the mail If you have any lab test that is abnormal or we need to change your treatment, we will call you to review the results.   Testing/Procedures: PYP SCAN    Follow-Up: At [facility], you and your health needs are our priority.  As part of our continuing mission to  provide you with exceptional heart care, we have created designated Provider Care Teams.  These Care Teams include your primary Cardiologist (physician) and Advanced Practice Providers (APPs -  Physician Assistants and Nurse Practitioners) who all work together to provide you with the care you need, when you need it.  We recommend signing up for the patient portal called "MyChart".  Sign up information is provided on this After Visit Summary.  MyChart is used to connect with patients for Virtual Visits (Telemedicine).  Patients are able to view lab/test results, encounter notes, upcoming appointments, etc.  Non-urgent messages can be sent to your provider as well.   To learn more about what you can do with MyChart, go to [email or web address].    Your next appointment:   2 month(s)  The format for your next appointment:   In Person  Provider:   Eleonore Chiquito, MD     Signed, Addison Naegeli. Audie Box, MD, [place]  [address] [place], [place] [address] [phone]  01/31/2021 10:53 AM

## 2021-01-31 ENCOUNTER — Encounter: Payer: Self-pay | Admitting: Cardiovascular Disease

## 2021-01-31 ENCOUNTER — Ambulatory Visit (INDEPENDENT_AMBULATORY_CARE_PROVIDER_SITE_OTHER): Payer: Medicare Other | Admitting: Cardiovascular Disease

## 2021-01-31 ENCOUNTER — Other Ambulatory Visit: Payer: Self-pay

## 2021-01-31 VITALS — BP 128/88 | HR 116 | Ht 66.0 in | Wt 149.0 lb

## 2021-01-31 DIAGNOSIS — I951 Orthostatic hypotension: Secondary | ICD-10-CM | POA: Diagnosis not present

## 2021-01-31 DIAGNOSIS — I5032 Chronic diastolic (congestive) heart failure: Secondary | ICD-10-CM

## 2021-01-31 DIAGNOSIS — I4819 Other persistent atrial fibrillation: Secondary | ICD-10-CM | POA: Diagnosis not present

## 2021-01-31 DIAGNOSIS — I10 Essential (primary) hypertension: Secondary | ICD-10-CM

## 2021-01-31 DIAGNOSIS — E782 Mixed hyperlipidemia: Secondary | ICD-10-CM | POA: Diagnosis not present

## 2021-01-31 MED ORDER — METOPROLOL TARTRATE 25 MG PO TABS: 25.0000 mg | ORAL_TABLET | Freq: Two times a day (BID) | ORAL | 3 refills | Status: DC

## 2021-01-31 MED ORDER — APIXABAN 5 MG PO TABS: 5.0000 mg | ORAL_TABLET | Freq: Two times a day (BID) | ORAL | 2 refills | Status: DC

## 2021-01-31 NOTE — Telephone Encounter (Signed)
Patient being seen today 01/31/21 at 9:00 AM

## 2021-01-31 NOTE — Patient Instructions (Signed)
Medication Instructions:  Start Metoprolol Tartrate 25 mg twice daily  Start Eliquis 5 mg twice daily  Stop Aspirin  *If you need a refill on your cardiac medications before your next appointment, please call your pharmacy*   Lab Work: Immunofixation urine, IFE, PE and FLC today   If you have labs (blood work) drawn today and your tests are completely normal, you will receive your results only by: [place] (if you have MyChart) OR A paper copy in the mail If you have any lab test that is abnormal or we need to change your treatment, we will call you to review the results.   Testing/Procedures: PYP SCAN    Follow-Up: At [facility], you and your health needs are our priority.  As part of our continuing mission to provide you with exceptional heart care, we have created designated Provider Care Teams.  These Care Teams include your primary Cardiologist (physician) and Advanced Practice Providers (APPs -  Physician Assistants and Nurse Practitioners) who all work together to provide you with the care you need, when you need it.  We recommend signing up for the patient portal called "MyChart".  Sign up information is provided on this After Visit Summary.  MyChart is used to connect with patients for Virtual Visits (Telemedicine).  Patients are able to view lab/test results, encounter notes, upcoming appointments, etc.  Non-urgent messages can be sent to your provider as well.   To learn more about what you can do with MyChart, go to [email or web address].    Your next appointment:   2 month(s)  The format for your next appointment:   In Person  Provider:   Eleonore Chiquito, MD

## 2021-02-01 ENCOUNTER — Encounter: Payer: 59 | Admitting: Internal Medicine

## 2021-02-01 ENCOUNTER — Other Ambulatory Visit: Payer: Self-pay

## 2021-02-01 ENCOUNTER — Telehealth: Payer: Self-pay

## 2021-02-01 DIAGNOSIS — I5032 Chronic diastolic (congestive) heart failure: Secondary | ICD-10-CM

## 2021-02-01 NOTE — Telephone Encounter (Signed)
-----  Message from Geralynn Rile, MD sent at 02/01/2021  4:45 PM EDT ----- Almyra Free:  The result note left my box. Anyway, I have concerns for AL amyloidosis. I want him to get a cardiac MRI for diastolic heart failure. In comments put "concerns for amylodosis". Just tell him I have concerns about abnormal protein in his blood and we need to see if it is in his heart. He needs the PYP and cardiac MRI.   [place] T. Audie Box, MD, [place]  [address] [place], [place] [address] [phone]  4:45 PM

## 2021-02-01 NOTE — Telephone Encounter (Signed)
Called patient- advised of results. Ordered Cardiac MRI, will message to get these scheduled.   Thank you!

## 2021-02-05 LAB — PE AND FLC, SERUM
A/G Ratio: 0.8 (ref 0.7–1.7)
Albumin ELP: 3.5 g/dL (ref 2.9–4.4)
Alpha 1: 0.3 g/dL (ref 0.0–0.4)
Alpha 2: 1 g/dL (ref 0.4–1.0)
Beta: 1.1 g/dL (ref 0.7–1.3)
Gamma Globulin: 1.8 g/dL (ref 0.4–1.8)
Globulin, Total: 4.2 g/dL — ABNORMAL HIGH (ref 2.2–3.9)
Ig Kappa Free Light Chain: 109.4 mg/L — ABNORMAL HIGH (ref 3.3–19.4)
Ig Lambda Free Light Chain: 47.6 mg/L — ABNORMAL HIGH (ref 5.7–26.3)
KAPPA/LAMBDA RATIO: 2.3 — ABNORMAL HIGH (ref 0.26–1.65)
Total Protein: 7.7 g/dL (ref 6.0–8.5)

## 2021-02-05 LAB — IMMUNOFIXATION, SERUM
IgA/Immunoglobulin A, Serum: 306 mg/dL (ref 61–437)
IgG (Immunoglobin G), Serum: 1779 mg/dL — ABNORMAL HIGH (ref 603–1613)
IgM (Immunoglobulin M), Srm: 123 mg/dL (ref 15–143)

## 2021-02-05 LAB — IMMUNOFIXATION, URINE

## 2021-02-06 ENCOUNTER — Telehealth: Payer: Self-pay | Admitting: Internal Medicine

## 2021-02-06 ENCOUNTER — Other Ambulatory Visit: Payer: Self-pay | Admitting: Internal Medicine

## 2021-02-06 DIAGNOSIS — I951 Orthostatic hypotension: Secondary | ICD-10-CM

## 2021-02-06 NOTE — Telephone Encounter (Signed)
An Aide through [facility] is very limited, now more than ever with staffing shortages. Only Medicaid pays for [facility] and he does not have that.

## 2021-02-06 NOTE — Telephone Encounter (Signed)
Rec'd call from the pt's Son Darlyn Chamber who states his Father has been in Twice 01/11/2021 Charleen Kirks) and 01/25/2021 with Dr.(Williams).  Per the son he states the pt was to have a [place] due to concern of the pt's recent admission 12/31/2020 and  current [place].  He does not understand why it has now been almost a month and no one has contacted his Father.  Please call back.

## 2021-02-06 NOTE — Telephone Encounter (Signed)
There has not been a referral placed for [facility].

## 2021-02-06 NOTE — Telephone Encounter (Signed)
Medicare and [employer] should both pay for [facility].

## 2021-02-07 ENCOUNTER — Telehealth: Payer: Self-pay | Admitting: Hematology and Oncology

## 2021-02-07 NOTE — Telephone Encounter (Signed)
Received a staff msg from Dr. Lorenso Courier to schedule Dylan [name] for an appt. A new hem appt has been scheduled for Dylan [name] to see Dr. Lorenso Courier on 8/10 at 10:20am. Appt date and time given to the pt's son.

## 2021-02-09 NOTE — Telephone Encounter (Signed)
CM sent to Adela Lank with [facility] and Danny Lawless with [facility] for referral for [facility] PT and Aide.

## 2021-02-13 ENCOUNTER — Telehealth (HOSPITAL_COMMUNITY): Payer: Self-pay | Admitting: *Deleted

## 2021-02-13 NOTE — Telephone Encounter (Signed)
Close encounter 

## 2021-02-14 ENCOUNTER — Inpatient Hospital Stay: Payer: Medicare Other

## 2021-02-14 ENCOUNTER — Inpatient Hospital Stay: Payer: Medicare Other | Attending: Hematology and Oncology | Admitting: Hematology and Oncology

## 2021-02-14 ENCOUNTER — Encounter: Payer: Self-pay | Admitting: Hematology and Oncology

## 2021-02-14 ENCOUNTER — Other Ambulatory Visit: Payer: Self-pay

## 2021-02-14 VITALS — BP 128/87 | HR 58 | Temp 97.6°F | Resp 18 | Wt 153.2 lb

## 2021-02-14 DIAGNOSIS — I951 Orthostatic hypotension: Secondary | ICD-10-CM | POA: Diagnosis not present

## 2021-02-14 DIAGNOSIS — Z87891 Personal history of nicotine dependence: Secondary | ICD-10-CM | POA: Diagnosis not present

## 2021-02-14 DIAGNOSIS — R768 Other specified abnormal immunological findings in serum: Secondary | ICD-10-CM | POA: Diagnosis not present

## 2021-02-14 LAB — CBC WITH DIFFERENTIAL (CANCER CENTER ONLY)
Abs Immature Granulocytes: 0.03 10*3/uL (ref 0.00–0.07)
Basophils Absolute: 0.1 10*3/uL (ref 0.0–0.1)
Basophils Relative: 1 %
Eosinophils Absolute: 0.2 10*3/uL (ref 0.0–0.5)
Eosinophils Relative: 2 %
HCT: 40.1 % (ref 39.0–52.0)
Hemoglobin: 13.3 g/dL (ref 13.0–17.0)
Immature Granulocytes: 0 %
Lymphocytes Relative: 23 %
Lymphs Abs: 2.3 10*3/uL (ref 0.7–4.0)
MCH: 29.8 pg (ref 26.0–34.0)
MCHC: 33.2 g/dL (ref 30.0–36.0)
MCV: 89.9 fL (ref 80.0–100.0)
Monocytes Absolute: 1.5 10*3/uL — ABNORMAL HIGH (ref 0.1–1.0)
Monocytes Relative: 15 %
Neutro Abs: 5.7 10*3/uL (ref 1.7–7.7)
Neutrophils Relative %: 59 %
Platelet Count: 207 10*3/uL (ref 150–400)
RBC: 4.46 MIL/uL (ref 4.22–5.81)
RDW: 15.1 % (ref 11.5–15.5)
WBC Count: 9.7 10*3/uL (ref 4.0–10.5)
nRBC: 0 % (ref 0.0–0.2)

## 2021-02-14 LAB — CMP (CANCER CENTER ONLY)
ALT: 12 U/L (ref 0–44)
AST: 12 U/L — ABNORMAL LOW (ref 15–41)
Albumin: 3.7 g/dL (ref 3.5–5.0)
Alkaline Phosphatase: 68 U/L (ref 38–126)
Anion gap: 10 (ref 5–15)
BUN: 45 mg/dL — ABNORMAL HIGH (ref 8–23)
CO2: 27 mmol/L (ref 22–32)
Calcium: 9.5 mg/dL (ref 8.9–10.3)
Chloride: 104 mmol/L (ref 98–111)
Creatinine: 1.81 mg/dL — ABNORMAL HIGH (ref 0.61–1.24)
GFR, Estimated: 38 mL/min — ABNORMAL LOW (ref >60)
Glucose, Bld: 234 mg/dL — ABNORMAL HIGH (ref 70–99)
Potassium: 3.4 mmol/L — ABNORMAL LOW (ref 3.5–5.1)
Sodium: 141 mmol/L (ref 135–145)
Total Bilirubin: 0.4 mg/dL (ref 0.3–1.2)
Total Protein: 7.9 g/dL (ref 6.5–8.1)

## 2021-02-14 LAB — LACTATE DEHYDROGENASE: LDH: 170 U/L (ref 98–192)

## 2021-02-14 NOTE — Progress Notes (Signed)
[place] [phone]   Fax:([phone] [place] NOTE  Patient Care Team: Angelica Pou, MD as PCP - General (Internal Medicine) Elmarie Shiley, MD as Consulting Physician (Nephrology)  Hematological/Oncological History # Elevated Serum Free Light Chain Ratio # Concern for Cardiac Amyloidosis 01/31/2021: Kappa 109.4, Lambda 47.6, ratio 2.3, no evidence of M protein on SPEP or IFE.  02/14/2021: establish care with Dr. Lorenso Courier   CHIEF COMPLAINTS/PURPOSE OF CONSULTATION:  " Concern for Cardiac Amyloidosis "  HISTORY OF PRESENTING ILLNESS:  Dylan [name] 75 y.o. male with medical history significant for DM type II, gout, dCHF, OSA, and obesity who presents for evaluation of elevated serum free light chain ratio and concern for cardiac amyloidosis.   On review of the previous records Dylan [name] was last seen by cardiology on 01/31/2021.  At that time it was noted that he had recurrent episodes of orthostatic hypotension he was being evaluated for atrial fibrillation.  He was noted to have an echocardiogram which showed ejection fraction of 50% with severe concentric left ventricular hypertrophy.  Due to this there was concern for possible amyloidosis.  He had serum free light chains ordered which showed a kappa of one 9.4, lambda 47.6, and a ratio of 2.3.  There was no evidence of monoclonal protein on the SPEP with the IFE.  Due to concern for possible cardiac amyloidosis the patient was referred to hematology for further evaluation management.  On exam today Dylan [name] is accompanied by his son.  He reports that he has been having difficulty with drops in his blood pressure and orthostatic hypotension.  He also has lost a considerable amount of weight, reporting a 40 pound weight loss over the last year.  He notes that he has not been having any issues with shortness of breath but does endorse having some swelling of his lower extremities.  He notes that he  is taking fluid pills and his legs are nonswollen today.  He does have flares of gout periodically as well.  Otherwise he denies any chest pain, nausea, vomiting, diarrhea.  He does have some sweats occasionally at night but no fevers or chills.  On further discussion he has a family history of heart disease in his sisters, having both experienced congestive heart failure.  He is a former smoker having quit 30 years ago and does not currently drink.  He previously worked at a Teacher, English as a foreign language for [employer].  He notes he is not having any urinary symptoms at this time.  Denies any changes in the color of his urine or bubbling of the urine.  A full 10 point ROS is listed below.  MEDICAL HISTORY:  Past Medical History:  Diagnosis Date   Anemia 10/18/2014   Baseline about 12 and stable from 2010 to 2016. Colon 2009 in [place] (records cannot be obtained).  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 Grade 2. EF 50-55%; echo repeated 2019, severe hypertrophy with elevated  filling pressures   Chronic kidney disease, stage 3b ([place]) 03/09/2020   Dr. Justin Mend  Nephrologist, f/u Q 87M   Degloving injury of finger 03/28/2020   10/21/19: copied from op note "1.  Open reduction percutaneous pinning left small finger proximal phalanx fracture 2.  Complex repair of laceration to left small finger 3 cm in length 3.  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 No significant  callus formation is noted. Pins had been removed   Diverticulosis 10/08/2014   Seen on CT. Reportedly on Colon in [place] in 2009. Freq bouts of diverticulitis.   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 Stable from 2010 to 2016.  Negative SPEP and UPEP 2014 after ARF 2/2 continued ACE (lisinopril 20) use while vol contracted. 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 Failed CPAP on study. BiPA inspiratory pressure of 21 and expiratory pressure of 17 CWP. Consider ENT evaluation for potentially correctable upper airway obstruction contributing to the need for unusually high pressures - ENT July 2015 did not feel any intervention surgically was indicated. He wore a large F&P Simplus fullfac   Personal history of colonic polyps 03/28/2014   Dr. Benson Norway, 2 polyps removed 2015.  No polyps on f/u in 2020.  No further surveillance suggested per Dr. Benson Norway.   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  He is unable to breathe through his nose comfortably.  This is part of the reason why he does not wear his oxygen regularly.  Nasal passages are nearly completely obstructed erythematous smooth glistening surface.  He has been told by his eye doctor that part of the reason his e   Resistant hypertension 10/09/2014   Poor control with 6 drug therapy. 2016 : Aldo 37 but ARR 23.5    Severe pulmonary arterial systolic hypertension ([place]) 10/18/2014   Noted as severe ECHO 2016 and 2019. Likely 2/2 severe untreated OSA. Pt  is not adherent to CPAP.   Thrombocytopenia ([place]) 10/09/2016   Nl liver and spleen on [place] 2019   Ulcer aphthous oral 04/27/2020   Evaluated emergency room last week, saw with Magic mouthwash    SURGICAL HISTORY: Past Surgical History:  Procedure Laterality Date   CHOLECYSTECTOMY     COLONOSCOPY WITH PROPOFOL N/A 02/12/2019   Procedure: COLONOSCOPY WITH PROPOFOL;  Surgeon: Carol Ada, MD;  Location: [facility];  Service: Endoscopy;  Laterality: N/A;   PERCUTANEOUS PINNING Left 10/21/2019   Procedure: CLOSED REDUCTION WITH PERCUTANEOUS PINNING AND SUTURE REPAIR OF LACERATION  OF SMALL FINGER,  SUTURE REPAIR OF LACERATION OF INDEX FINGER;  Surgeon: Cindra Presume, MD;  Location: [facility];  Service: Plastics;  Laterality: Left;    SOCIAL HISTORY: Social History   Socioeconomic History   Marital status: Divorced    Spouse name: Not on file   Number of children: Not on file   Years of education: Not on file  Highest education level: Not on file  Occupational History   Not on file  Tobacco Use   Smoking status: Former    Years: 20.00    Types: Cigarettes    Quit date: 10/02/1972    Years since quitting: 48.4   Smokeless tobacco: Never  Vaping Use   Vaping Use: Never used  Substance and Sexual Activity   Alcohol use: No    Alcohol/week: 0.0 standard drinks   Drug use: No   Sexual activity: Never  Other Topics Concern   Not on file  Social History Narrative   Worked in Civil engineer, 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 Had yearly occupational testing inc PFT's. Now retired. Divorced. Has male sig other. Likes to go fishing.      He starting drinking as a teen and would drink to passing out. Couldn't keep job, have family. He quit drinking and smoking cold [place] with help of his faith. No ETOH since about age 82ish      Total of 7 brothers and 6 sisters.   Social Determinants of Health   Financial Resource Strain: Not on file  Food Insecurity: Not on file  Transportation Needs: Not on file   Physical Activity: Not on file  Stress: Not on file  Social Connections: Not on file  Intimate Partner Violence: Not on file    FAMILY HISTORY: Family History  Problem Relation Age of Onset   Heart failure Sister        Died of complications Nov 1610   CAD Sister        CABG x3 while in her 67's    ALLERGIES:  is allergic to ace inhibitors and spironolactone.  MEDICATIONS:  Current Outpatient Medications  Medication Sig Dispense Refill   allopurinol (ZYLOPRIM) 100 MG tablet Take 1 tablet (100 mg total) by mouth daily. 90 tablet 3   amLODipine (NORVASC) 5 MG tablet Take 1 tablet (5 mg total) by mouth daily. 30 tablet 11   apixaban (ELIQUIS) 5 MG TABS tablet Take 1 tablet (5 mg total) by mouth 2 (two) times daily. 60 tablet 2   atorvastatin (LIPITOR) 40 MG tablet Take 1 tablet (40 mg total) by mouth daily. 90 tablet 3   carboxymethylcellulose (REFRESH PLUS) 0.5 % SOLN Place 1 drop into both eyes 3 (three) times daily as needed (dry eyes).     cycloSPORINE (RESTASIS) 0.05 % ophthalmic emulsion Place 1 drop into both eyes 2 (two) times daily. 0.4 mL 1   FREESTYLE LITE test strip Use to check blood sugar 3 times daily, DIAG CODE E11.29, INSULIN DEPENDENT 300 each 1   latanoprost (XALATAN) 0.005 % ophthalmic solution SMARTSIG:In Eye(s)     metFORMIN (GLUCOPHAGE) 500 MG tablet Take 1 tablet (500 mg total) by mouth daily with breakfast. 30 tablet 0   metoprolol tartrate (LOPRESSOR) 25 MG tablet Take 1 tablet (25 mg total) by mouth 2 (two) times daily. 180 tablet 3   polyethylene glycol (MIRALAX / GLYCOLAX) packet Take 17 g by mouth daily as needed (constipation).      No current facility-administered medications for this visit.    REVIEW OF SYSTEMS:   Constitutional: ( - ) fevers, ( - )  chills , ( - ) night sweats Eyes: ( - ) blurriness of vision, ( - ) double vision, ( - ) watery eyes Ears, nose, mouth, throat, and face: ( - ) mucositis, ( - ) sore throat Respiratory: ( - ) cough,  ( - ) dyspnea, ( - ) wheezes Cardiovascular: ( - )  palpitation, ( - ) chest discomfort, ( - ) lower extremity swelling Gastrointestinal:  ( - ) nausea, ( - ) heartburn, ( - ) change in bowel habits Skin: ( - ) abnormal skin rashes Lymphatics: ( - ) new lymphadenopathy, ( - ) easy bruising Neurological: ( - ) numbness, ( - ) tingling, ( - ) new weaknesses Behavioral/Psych: ( - ) mood change, ( - ) new changes  All other systems were reviewed with the patient and are negative.  PHYSICAL EXAMINATION: ECOG PERFORMANCE STATUS: 2 - Symptomatic, <50% confined to bed  Vitals:   02/14/21 1030  BP: 128/87  Pulse: (!) 58  Resp: 18  Temp: 97.6 F (36.4 C)  SpO2: 100%   Filed Weights   02/14/21 1030  Weight: 153 lb 3.2 oz (69.5 kg)    GENERAL: well appearing elderly African-American male in NAD  SKIN: skin color, texture, turgor are normal, no rashes or significant lesions EYES: conjunctiva are pink and non-injected, sclera clear LUNGS: clear to auscultation and percussion with normal breathing effort HEART: regular rate & rhythm and no murmurs and no lower extremity edema PSYCH: alert & oriented x 3, fluent speech NEURO: no focal motor/sensory deficits  LABORATORY DATA:  I have reviewed the data as listed CBC Latest Ref Rng & Units 12/31/2020 11/28/2020 11/23/2020  WBC 4.0 - 10.5 K/uL 11.0(H) 7.6 10.3  Hemoglobin 13.0 - 17.0 g/dL 13.1 10.2(L) 10.2(L)  Hematocrit 39.0 - 52.0 % 40.2 32.8(L) 30.0(L)  Platelets 150 - 400 K/uL 346 156 136(L)    CMP Latest Ref Rng & Units 01/31/2021 01/02/2021 01/01/2021  Glucose 70 - 99 mg/dL - 137(H) 132(H)  BUN 8 - 23 mg/dL - 22 28(H)  Creatinine 0.61 - 1.24 mg/dL - 1.21 1.34(H)  Sodium 135 - 145 mmol/L - 138 137  Potassium 3.5 - 5.1 mmol/L - 4.0 3.4(L)  Chloride 98 - 111 mmol/L - 98 103  CO2 22 - 32 mmol/L - 31 26  Calcium 8.9 - 10.3 mg/dL - 9.6 8.9  Total Protein 6.0 - 8.5 g/dL 7.7 - -  Total Bilirubin 0.3 - 1.2 mg/dL - - -  Alkaline Phos 38 - 126  U/L - - -  AST 15 - 41 U/L - - -  ALT 0 - 44 U/L - - -    RADIOGRAPHIC STUDIES: No results found.  ASSESSMENT & PLAN KAMDYN COLBORN 75 y.o. male with medical history significant for DM type II, gout, dCHF, OSA, and obesity who presents for evaluation of elevated serum free light chain ratio and concern for cardiac amyloidosis.   After review of the labs, review of the records, and discussion with the patient the patients findings are most consistent with heart failure of unclear etiology, suspicion for cardiac amyloidosis.  At this time I have a low suspicion his findings are secondary to AL amyloidosis.  The patient serum free light chains are elevated in the setting of CKD and there is no evidence of a monoclonal protein.  In order to assure this is the case we will repeat the studies today along with a UPEP.  If the UPEP shows no clear evidence of a monoclonal protein there is no indication for bone marrow biopsy or fat pad biopsy from our perspective.  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  This diagnosis we supported by cardiac MRI which has been ordered by the cardiology service.  We will await the results of the below studies before deciding when to see the patient next.  #  Elevated Serum Free Light Chain Ratio # Concern for Cardiac Amyloidosis --findings are consistent with elevated SFLC in setting of CKD. No evidence of a monoclonal protein at this time --SFLC ratio can be elevated in CKD and does not represent a concerning abnormality --will order a UPEP to look for monoclonal protein in the urine --no clear indication for a bone marrow biopsy at this time --gold standard for diagnosis of amyloidosis is biopsy of the affected tissues. If cardiac biopsy is not feasible consider fat pad biopsy which has considerably higher sensitivity than bone marrow biopsy --awaiting results of cardiac MRI, currently scheduled for 03/14/2021 --RTC if above results show concern for AL amyloidosis.    Orders Placed This Encounter  Procedures   CBC with Differential ([place] Only)    Standing Status:   Future    Number of Occurrences:   1    Standing Expiration Date:   02/14/2022   CMP ([place] only)    Standing Status:   Future    Number of Occurrences:   1    Standing Expiration Date:   02/14/2022   Lactate dehydrogenase (LDH)    Standing Status:   Future    Number of Occurrences:   1    Standing Expiration Date:   02/14/2022   Multiple Myeloma Panel (SPEP&IFE w/QIG)    Standing Status:   Future    Number of Occurrences:   1    Standing Expiration Date:   02/14/2022   Kappa/lambda light chains    Standing Status:   Future    Number of Occurrences:   1    Standing Expiration Date:   02/14/2022   24-Hr Ur UPEP/UIFE/Light Chains/TP    Standing Status:   Future    Standing Expiration Date:   02/14/2022    All questions were answered. The patient knows to call the clinic with any problems, questions or concerns.  A total of more than 60 minutes were spent on this encounter with face-to-face time and non-face-to-face time, including preparing to see the patient, ordering tests and/or medications, counseling the patient and coordination of care as outlined above.   Ledell Peoples, MD Department of Hematology/Oncology [place] [facility] Phone: [phone] Pager: [phone] Email: [email or web address]  02/14/2021 11:51 AM

## 2021-02-15 LAB — KAPPA/LAMBDA LIGHT CHAINS
Kappa free light chain: 98 mg/L — ABNORMAL HIGH (ref 3.3–19.4)
Kappa, lambda light chain ratio: 2.55 — ABNORMAL HIGH (ref 0.26–1.65)
Lambda free light chains: 38.5 mg/L — ABNORMAL HIGH (ref 5.7–26.3)

## 2021-02-16 ENCOUNTER — Ambulatory Visit (HOSPITAL_COMMUNITY)
Admission: RE | Admit: 2021-02-16 | Payer: 59 | Source: Ambulatory Visit | Attending: Cardiovascular Disease | Admitting: Cardiovascular Disease

## 2021-02-18 ENCOUNTER — Telehealth: Payer: Self-pay | Admitting: Student

## 2021-02-18 NOTE — Telephone Encounter (Addendum)
   Made aware by Dr. Radford Pax (was covering as the overnight fellow) that [employer] called to report the patient had an episode of 3rd degree heart block for 6 seconds but was asymptomatic. She asked that I reach out to [employer] to have the strips faxed to the cath lab so the actual strips can be reviewed by EP.   Called [employer] and they will have the strips faxed over.   Signed, Erma Heritage, PA-C 02/18/2021, 7:49 AM Pager: [phone]   Strips received and reviewed with Dr. Audie Box as he ordered the monitor. Most of the strips appear most consistent with second-degree type I but cannot rule out brief third-degree AV block. The patient was asymptomatic at that time and discussed with Dr. Audie Box and he does not need to come to the emergency department at this time. He will follow-up on the full monitor results once available unless he has additional episodes in the interim.   Signed, Erma Heritage, PA-C 02/18/2021, 9:31 AM Pager: [phone]

## 2021-02-19 ENCOUNTER — Other Ambulatory Visit: Payer: Self-pay | Admitting: Hematology and Oncology

## 2021-02-19 ENCOUNTER — Other Ambulatory Visit: Payer: Self-pay | Admitting: *Deleted

## 2021-02-19 DIAGNOSIS — Z87891 Personal history of nicotine dependence: Secondary | ICD-10-CM | POA: Diagnosis not present

## 2021-02-19 DIAGNOSIS — I951 Orthostatic hypotension: Secondary | ICD-10-CM | POA: Diagnosis not present

## 2021-02-19 DIAGNOSIS — R768 Other specified abnormal immunological findings in serum: Secondary | ICD-10-CM | POA: Diagnosis not present

## 2021-02-19 LAB — MULTIPLE MYELOMA PANEL, SERUM
Albumin SerPl Elph-Mcnc: 3.6 g/dL (ref 2.9–4.4)
Albumin/Glob SerPl: 1.1 (ref 0.7–1.7)
Alpha 1: 0.2 g/dL (ref 0.0–0.4)
Alpha2 Glob SerPl Elph-Mcnc: 0.8 g/dL (ref 0.4–1.0)
B-Globulin SerPl Elph-Mcnc: 1 g/dL (ref 0.7–1.3)
Gamma Glob SerPl Elph-Mcnc: 1.6 g/dL (ref 0.4–1.8)
Globulin, Total: 3.6 g/dL (ref 2.2–3.9)
IgA: 272 mg/dL (ref 61–437)
IgG (Immunoglobin G), Serum: 1816 mg/dL — ABNORMAL HIGH (ref 603–1613)
IgM (Immunoglobulin M), Srm: 120 mg/dL (ref 15–143)
Total Protein ELP: 7.2 g/dL (ref 6.0–8.5)

## 2021-02-19 NOTE — Telephone Encounter (Signed)
[name]  Velora Heckler, RN 1945/12/31   Good morning,   This patient has refused HH. He said he does not need it.  Can you please let the Provider know.  Thank you.   [place], [place], [place], [place]; [place], Orvis Brill, RN  FYI;  Dylan [name] refused [facility] services.

## 2021-02-20 ENCOUNTER — Encounter (HOSPITAL_COMMUNITY): Payer: Self-pay | Admitting: Cardiovascular Disease

## 2021-02-21 ENCOUNTER — Telehealth: Payer: Self-pay | Admitting: Internal Medicine

## 2021-02-21 ENCOUNTER — Other Ambulatory Visit: Payer: Self-pay | Admitting: Internal Medicine

## 2021-02-21 DIAGNOSIS — I70219 Atherosclerosis of native arteries of extremities with intermittent claudication, unspecified extremity: Secondary | ICD-10-CM

## 2021-02-21 LAB — UPEP/UIFE/LIGHT CHAINS/TP, 24-HR UR
% BETA, Urine: 19.1 %
ALPHA 1 URINE: 2.6 %
Albumin, U: 56.9 %
Alpha 2, Urine: 4.4 %
Free Kappa Lt Chains,Ur: 186.74 mg/L — ABNORMAL HIGH (ref 1.17–86.46)
Free Kappa/Lambda Ratio: 7.37 (ref 1.83–14.26)
Free Lambda Lt Chains,Ur: 25.33 mg/L — ABNORMAL HIGH (ref 0.27–15.21)
GAMMA GLOBULIN URINE: 17.1 %
Total Protein, Urine-Ur/day: 450 mg/24 hr — ABNORMAL HIGH (ref 30–150)
Total Protein, Urine: 23.1 mg/dL
Total Volume: 1950

## 2021-02-21 MED ORDER — ATORVASTATIN CALCIUM 40 MG PO TABS: 40.0000 mg | ORAL_TABLET | Freq: Every day | ORAL | 3 refills | Status: DC

## 2021-02-21 NOTE — Telephone Encounter (Signed)
Notified by nursing staff that Dylan [name] had declined referral to home therapy, stating that he was feeling better.  I called to speak to Dylan [name], who did confirm today by phone that he has been feeling much much better over the preceding weeks.  He is having markedly reduced dizziness, now occasionally experienced only once daily, of brief and mild duration and intensity, and has experienced no further falls.  He is eating well and is beginning to gain weight.  He comments that on 2 separate days within the past week he went shopping with his son and walked about the store without using his walker, feeling quite well.  He is very pleased.  He notes that he recently was referred to a nephrologist for protein findings on tests ordered by the cardiologist.  He has not yet heard the results.  He also notes that the cardiologist told him he needed a test that would take 4 hours and he is not sure that he wants to do this.  Chart reviewed, he was intended to have a pharmacologic stress test which I explained to him and he seems to acknowledge the purpose and seems willing to proceed.  He requested a refill of his atorvastatin.  I will see him at scheduled appointment 03/15/2021.

## 2021-02-22 ENCOUNTER — Telehealth (HOSPITAL_BASED_OUTPATIENT_CLINIC_OR_DEPARTMENT_OTHER): Payer: Self-pay | Admitting: Cardiovascular Disease

## 2021-02-22 NOTE — Telephone Encounter (Signed)
Spoke with Dylan [name] regarding the Friday 03/09/21 1:00pm Amyloid study appointment at the [facility] location.  Will mail  information to patient and he  voiced his understanding.

## 2021-02-23 ENCOUNTER — Ambulatory Visit: Payer: 59 | Admitting: Cardiovascular Disease

## 2021-03-03 DIAGNOSIS — U071 COVID-19: Secondary | ICD-10-CM | POA: Diagnosis not present

## 2021-03-05 ENCOUNTER — Telehealth: Payer: Self-pay

## 2021-03-05 NOTE — Telephone Encounter (Signed)
Pt is requesting a call back .. he stated that his blood sugars has been over 200 for the past 2 weeks

## 2021-03-05 NOTE — Telephone Encounter (Signed)
RTC, recorded message received that VM Box has not been set up yet.  RN unable to leave a message.   SChaplin, RN,BSN

## 2021-03-06 ENCOUNTER — Ambulatory Visit (INDEPENDENT_AMBULATORY_CARE_PROVIDER_SITE_OTHER): Payer: Medicare Other | Admitting: Internal Medicine

## 2021-03-06 DIAGNOSIS — E1121 Type 2 diabetes mellitus with diabetic nephropathy: Secondary | ICD-10-CM | POA: Diagnosis not present

## 2021-03-06 MED ORDER — METFORMIN HCL 500 MG PO TABS: 500.0000 mg | ORAL_TABLET | Freq: Every day | ORAL | 11 refills | Status: DC

## 2021-03-06 NOTE — Assessment & Plan Note (Addendum)
[name] describes blood sugar readings over the last two weeks that have been consistently over 200. The lowest reading was 214 and highest was 290, and he states that the readings average around 240-250. He checks his sugar each morning prior to eating. When asking about his medication, he confirms that he is no longer taking Trulicity as instructed, however when asked if he is still taking metformin 500 mg once daily, he states that he does not have that medication and he most recently went to the pharmacy to pick up refills on Friday 03/03/2021. He states that he has been gaining weight and had an increase in appetite since discontinuing the Trulicity. He is feeling well overall at this time with no complaints and his encouraged that his home blood pressure readings have come down.  On review of his medications it appears that he was given a 30 day supply with no refills on 01/11/2021 and has therefore been out of his medication. I have sent in a new prescription for metformin 500 mg once daily with 11 refills. He has a follow-up appointment scheduled for 03/14/2021.

## 2021-03-06 NOTE — Progress Notes (Signed)
[facility] Telephone Encounter Continuity Care Appointment  HPI:  This telephone encounter was created for [name] on 03/06/2021 for the following purpose/cc: elevated blood sugar readings for two weeks. Please see problem based charting for detailed assessment and plan.   Past Medical History:  Past Medical History:  Diagnosis Date   Anemia 10/18/2014   Baseline about 12 and stable from 2010 to 2016. Colon 2009 in [place] (records cannot be obtained).  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 Grade 2. EF 50-55%; echo repeated 2019, severe hypertrophy with elevated filling pressures   Chronic kidney disease, stage 3b ([place]) 03/09/2020   Dr. Justin Mend  Nephrologist, f/u Q 69M   Degloving injury of finger 03/28/2020   10/21/19: copied from op note "1.  Open reduction percutaneous pinning left small finger proximal phalanx fracture 2.  Complex repair of laceration to left small finger 3 cm in length 3.  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 No significant callus formation is noted. Pins had been removed   Diverticulosis 10/08/2014   Seen on CT. Reportedly on Colon in [place] in 2009. Freq bouts of diverticulitis.   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 Stable from 2010 to 2016.  Negative SPEP and UPEP 2014 after ARF 2/2 continued ACE (lisinopril 20) use while vol contracted. 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 Failed CPAP on study. BiPA inspiratory pressure of 21 and expiratory pressure of 17 CWP. Consider ENT evaluation for potentially correctable upper airway obstruction contributing to the need for unusually high pressures - ENT July 2015 did not feel any intervention surgically was indicated. He wore a large F&P Simplus fullfac   Personal history of colonic polyps 03/28/2014   Dr. Benson Norway, 2 polyps removed 2015.  No polyps on f/u in 2020.  No further surveillance suggested per Dr. Benson Norway.   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  He is unable to breathe through his nose comfortably.  This is part of the reason why he does not wear his oxygen regularly.  Nasal passages are nearly completely obstructed erythematous smooth glistening surface.  He has been told by his eye doctor that part of the reason his e   Resistant hypertension 10/09/2014   Poor control with 6 drug therapy. 2016 : Aldo 37 but ARR 23.5    Severe pulmonary arterial systolic hypertension ([place]) 10/18/2014  Noted as severe ECHO 2016 and 2019. Likely 2/2 severe untreated OSA. Pt is not adherent to CPAP.   Thrombocytopenia ([place]) 10/09/2016   Nl liver and spleen on [place] 2019   Ulcer aphthous oral 04/27/2020   Evaluated emergency room last week, saw with Magic mouthwash     ROS:  Negative unless otherwise noted in detailed assessment.   Assessment / Plan / Recommendations:  Please see A&P under problem oriented charting for assessment of the patient's  acute and chronic medical conditions.  As always, pt is advised that if symptoms worsen or new symptoms arise, they should go to an urgent care facility or to to ER for further evaluation.   Consent and Medical Decision Making:  Patient discussed with [name] This is a telephone encounter between Dylan [name] and Farrel Gordon on 03/06/2021 for elevated blood sugar readings. The visit was conducted with the patient located at home and Farrel Gordon at [facility]. The patient's identity was confirmed using their DOB and current address. The patient has consented to being evaluated through a telephone encounter and understands the associated risks (an examination cannot be done and the patient may need to come in for an appointment) / benefits (allows the patient to remain at home, decreasing exposure to coronavirus). I personally spent 15 minutes on medical discussion.

## 2021-03-06 NOTE — Telephone Encounter (Signed)
Pt's calling again about his BS - stated he had called yesterday and no one called him back. Informed pt someone had called him but no answer nor vm. Stated his BS have been running high x 3 weeks; in the high 200's. This am it was 252 but has been as high as 290. Stated he was taken off insulin; taking Metformin only. Telehealt appt schedule, pt's preference, with Dr Marlou Sa today @ 1445 PM. Pt aware to have his phone nearby.

## 2021-03-06 NOTE — Progress Notes (Signed)
Internal Medicine Clinic Attending ? ?Case discussed with Dr. Dean  At the time of the visit.  We reviewed the resident?s history and exam and pertinent patient test results.  I agree with the assessment, diagnosis, and plan of care documented in the resident?s note.  ?

## 2021-03-08 ENCOUNTER — Telehealth (HOSPITAL_COMMUNITY): Payer: Self-pay | Admitting: *Deleted

## 2021-03-08 NOTE — Telephone Encounter (Signed)
Close encounter 

## 2021-03-09 ENCOUNTER — Ambulatory Visit (HOSPITAL_COMMUNITY)
Admission: RE | Admit: 2021-03-09 | Discharge: 2021-03-09 | Disposition: A | Discharge status: Home / Self Care | Payer: Medicare Other | Source: Ambulatory Visit | Attending: Cardiovascular Disease | Admitting: Cardiovascular Disease

## 2021-03-09 ENCOUNTER — Other Ambulatory Visit: Payer: Self-pay

## 2021-03-09 DIAGNOSIS — I5032 Chronic diastolic (congestive) heart failure: Secondary | ICD-10-CM

## 2021-03-09 MED ORDER — TECHNETIUM TC 99M PYROPHOSPHATE
21.8000 | Freq: Once | INTRAVENOUS | Status: AC
  Administered 2021-03-09: 21.8 via INTRAVENOUS

## 2021-03-10 DIAGNOSIS — U071 COVID-19: Secondary | ICD-10-CM | POA: Diagnosis not present

## 2021-03-13 ENCOUNTER — Telehealth (HOSPITAL_COMMUNITY): Payer: Self-pay | Admitting: Emergency Medicine

## 2021-03-13 ENCOUNTER — Encounter (HOSPITAL_COMMUNITY): Payer: Self-pay | Admitting: Emergency Medicine

## 2021-03-13 NOTE — Telephone Encounter (Signed)
Reaching out to patient to offer assistance regarding upcoming cardiac imaging study; pt verbalizes understanding of appt date/time, parking situation and where to check in,and verified current allergies; name and call back number provided for further questions should they arise Marchia Bond RN Navigator Cardiac Imaging [facility] [phone] office [phone] cell   Denies implants Some claustro

## 2021-03-14 ENCOUNTER — Other Ambulatory Visit: Payer: Self-pay

## 2021-03-14 ENCOUNTER — Ambulatory Visit (HOSPITAL_COMMUNITY)
Admission: RE | Admit: 2021-03-14 | Discharge: 2021-03-14 | Disposition: A | Discharge status: Home / Self Care | Payer: Medicare Other | Source: Ambulatory Visit | Attending: Cardiovascular Disease | Admitting: Cardiovascular Disease

## 2021-03-14 DIAGNOSIS — I5032 Chronic diastolic (congestive) heart failure: Secondary | ICD-10-CM

## 2021-03-14 MED ORDER — GADOBUTROL 1 MMOL/ML IV SOLN
7.0000 mL | Freq: Once | INTRAVENOUS | Status: AC | PRN
  Administered 2021-03-14: 7 mL via INTRAVENOUS

## 2021-03-15 ENCOUNTER — Ambulatory Visit (INDEPENDENT_AMBULATORY_CARE_PROVIDER_SITE_OTHER): Payer: Medicare Other | Admitting: Internal Medicine

## 2021-03-15 VITALS — BP 147/92 | HR 79 | Temp 98.6°F | Ht 66.0 in | Wt 159.1 lb

## 2021-03-15 DIAGNOSIS — I2721 Secondary pulmonary arterial hypertension: Secondary | ICD-10-CM

## 2021-03-15 DIAGNOSIS — R634 Abnormal weight loss: Secondary | ICD-10-CM

## 2021-03-15 DIAGNOSIS — R42 Dizziness and giddiness: Secondary | ICD-10-CM

## 2021-03-15 DIAGNOSIS — Z23 Encounter for immunization: Secondary | ICD-10-CM | POA: Diagnosis not present

## 2021-03-15 DIAGNOSIS — I951 Orthostatic hypotension: Secondary | ICD-10-CM

## 2021-03-15 DIAGNOSIS — E440 Moderate protein-calorie malnutrition: Secondary | ICD-10-CM

## 2021-03-15 DIAGNOSIS — E1121 Type 2 diabetes mellitus with diabetic nephropathy: Secondary | ICD-10-CM | POA: Diagnosis not present

## 2021-03-15 DIAGNOSIS — I422 Other hypertrophic cardiomyopathy: Secondary | ICD-10-CM

## 2021-03-15 DIAGNOSIS — I129 Hypertensive chronic kidney disease with stage 1 through stage 4 chronic kidney disease, or unspecified chronic kidney disease: Secondary | ICD-10-CM | POA: Diagnosis not present

## 2021-03-15 DIAGNOSIS — R55 Syncope and collapse: Secondary | ICD-10-CM | POA: Diagnosis not present

## 2021-03-15 DIAGNOSIS — E1122 Type 2 diabetes mellitus with diabetic chronic kidney disease: Secondary | ICD-10-CM

## 2021-03-15 DIAGNOSIS — I130 Hypertensive heart and chronic kidney disease with heart failure and stage 1 through stage 4 chronic kidney disease, or unspecified chronic kidney disease: Secondary | ICD-10-CM | POA: Diagnosis not present

## 2021-03-15 DIAGNOSIS — N1831 Chronic kidney disease, stage 3a: Secondary | ICD-10-CM | POA: Diagnosis not present

## 2021-03-15 DIAGNOSIS — I5032 Chronic diastolic (congestive) heart failure: Secondary | ICD-10-CM

## 2021-03-15 LAB — GLUCOSE, CAPILLARY: Glucose-Capillary: 337 mg/dL — ABNORMAL HIGH (ref 70–99)

## 2021-03-15 LAB — POCT GLYCOSYLATED HEMOGLOBIN (HGB A1C): Hemoglobin A1C: 8.7 % — AB (ref 4.0–5.6)

## 2021-03-15 MED ORDER — METFORMIN HCL 500 MG PO TABS: 500.0000 mg | ORAL_TABLET | Freq: Two times a day (BID) | ORAL | 11 refills | Status: DC

## 2021-03-15 MED ORDER — AMLODIPINE BESYLATE 5 MG PO TABS: 10.0000 mg | ORAL_TABLET | Freq: Every day | ORAL | 11 refills | Status: DC

## 2021-03-15 NOTE — Progress Notes (Signed)
Routine close f/u.  Since last visit, he has completed an cardiac MRI 9/7 (Hypertrophic CM, EF 25%, RV EF 34%, sm 45m pericardial effusion over LV lat. wall) and nuclear stress test 9/2.  Cardiology visit with Dr. OAudie Box10/21 to discuss results.   Meanwhile, he is feeling great, energy up, no dypsnea with exertion or supine position, using no oxygen now in 2-3 months.  No palpitations. Infrequent and brief episodes of positional dizziness may happen on occasion, though nothing like the problems he was having before.  He has no problems with gait stability and has not been using a mobility device.    Voracious appetite, feels his weight is returning.  Blood sugars running high.  Went a period of time without metformin due to an unrefilled one month prescription running out, has resumed it now daily w/o difficulty.  Gout joint pains in L then R knee last 3 days, resolve spontaneously.  No discomfort at this time.  Eye doc wants to do surgery on L eye, can't recall name of the surgery or the eye doctor, waiting for cardiac clearance     BP (!) 147/92   Pulse 79   Temp 98.6 F (37 C) (Oral)   Ht _0  (1.676 m)   Wt 159 lb 1.6 oz (72.2 kg)   SpO2 100%   BMI 25.68 kg/m  Habitus remains underweight though he doesn't appear as gaunt as at prior visits, and energy level is notably increased.  Bright and alert and recalling of details.    Heart irreg irreg, extra sound (gallop?) vs split heart sound. Lungs clear, no jvd, some prominence of superficial venous pattern arms and legs. No JVD.  No tenderness, warmth, or swelling of knee joints.  Skin turgor is normal.  Assessment and plan (see also problem based documentation) Mr. HBaquerohas improved significantly since he began a worrisome trend several months ago of weight loss, hypotension, and falls which in retrospect have been attributed to sensitivity to GLP1 agonist Trulicity.  Once the medicine was finally stopped (he continued taking it for a  period after being advised to discontinue), he has been gaining weight.  No cardiac symptoms at this time.  Extensive cardiac evaluation has not identified amyloid CM, which was on the differential.    BP is beginning to rise again as he improves (many antihypertensives were stopped due to hypotension).  He will increase amlodipine from 5 mg to 10 mg.  May take 2x51mtabs daily until he picks up new prescription for 10 mg.  Now that he is no longer on trulicity, hyperglycemia has developed.  Increase metformin from 500 mg daily to 500 mg bid with goal 1000 mg bid if tolerated and indicated at future visits.    High dose flu shot administered.  F/u 68M.

## 2021-03-15 NOTE — Patient Instructions (Signed)
Mr. Ducat, it's great to see you doing so much better!  Your weight is increasing, you are stronger, and rarely feeling dizzy.  Such good news.  Your blood sugar and blood pressure have been increasing, so we are going to make some changes:  Amlodipine 5 mg for blood pressure:  Take 2 every morning instead of 1. Metformin 500 mg for diabetes:  Take 1 every morning and 1 every night.  Flu shot today!  See you in 3 months for recheck. Take care and stay well.  [name]

## 2021-03-16 ENCOUNTER — Telehealth: Payer: Self-pay | Admitting: Internal Medicine

## 2021-03-16 DIAGNOSIS — E1122 Type 2 diabetes mellitus with diabetic chronic kidney disease: Secondary | ICD-10-CM

## 2021-03-16 DIAGNOSIS — I129 Hypertensive chronic kidney disease with stage 1 through stage 4 chronic kidney disease, or unspecified chronic kidney disease: Secondary | ICD-10-CM

## 2021-03-16 LAB — BMP8+ANION GAP
Anion Gap: 14 mmol/L (ref 10.0–18.0)
BUN/Creatinine Ratio: 26 — ABNORMAL HIGH (ref 10–24)
BUN: 32 mg/dL — ABNORMAL HIGH (ref 8–27)
CO2: 26 mmol/L (ref 20–29)
Calcium: 9.5 mg/dL (ref 8.6–10.2)
Chloride: 97 mmol/L (ref 96–106)
Creatinine, Ser: 1.21 mg/dL (ref 0.76–1.27)
Glucose: 345 mg/dL — ABNORMAL HIGH (ref 65–99)
Potassium: 3.4 mmol/L — ABNORMAL LOW (ref 3.5–5.2)
Sodium: 137 mmol/L (ref 134–144)
eGFR: 62 mL/min/{1.73_m2} (ref >59)

## 2021-03-16 MED ORDER — AMLODIPINE BESYLATE 10 MG PO TABS
10.0000 mg | ORAL_TABLET | Freq: Every day | ORAL | 3 refills | Status: DC
Start: 2021-03-16 — End: 2021-04-05

## 2021-03-16 NOTE — Telephone Encounter (Signed)
Called to speak to Dylan [name] regarding reassuring test results.  He was pleased.

## 2021-03-21 ENCOUNTER — Encounter: Payer: Self-pay | Admitting: Internal Medicine

## 2021-03-21 ENCOUNTER — Telehealth: Payer: Self-pay | Admitting: Dietician

## 2021-03-21 NOTE — Progress Notes (Signed)
CALLED [facility] TO DISCONTINUE AUTO REFILLS FOR TRULICITY. PT INFORMED OFFICE THAT HE WAS NO LONGER TAKING THIS MEDICATION.

## 2021-03-21 NOTE — Telephone Encounter (Signed)
Mr. Arrants says we stopped his insulin about 2 months ago but it keeps getting delivered to him. He wonders why we didn't;t tell them to stop sending it to him. Please call him back. I think he must mean his Trulicity and it came from [facility] patient assistance. I will ask pharmacy team if they can help

## 2021-03-21 NOTE — Assessment & Plan Note (Signed)
BP uncontrolled 140s-160s on amlodipine 5 mg.  Increase to 10 mg daily.  As he continues to recover, it is anticipated that increasing need for medication will become evident (once on several drug regimen).

## 2021-03-21 NOTE — Assessment & Plan Note (Signed)
No syncope and infrequent, brief episodes of dizziness, much improved.  Will resolve this problem.

## 2021-03-21 NOTE — Assessment & Plan Note (Signed)
Voracious appetite: weight gain is occurring following cessation of Trulicity.  He remains very thin.

## 2021-03-21 NOTE — Assessment & Plan Note (Signed)
Much improved; episodes are much less frequent and quite brief, no longer having difficulty with transfers or ambulation.  Monitor.

## 2021-03-21 NOTE — Assessment & Plan Note (Signed)
Uncontrolled hyperglycemia, asymptomatic.  INcrease metformin from 500 mg daily to 500 mg bid, further titration as necessary.

## 2021-03-23 NOTE — Assessment & Plan Note (Signed)
Symptomatically resolved, in process of reintroducing antihypertensive regimen for recurrence of HTN.

## 2021-03-23 NOTE — Assessment & Plan Note (Signed)
Recent studies show resolution of the pulmonary artery hypertension (echo), but RV fxn is moderatly reduced per cardiac MRI (34%).  No clinical R heart failure.  Cardiology visit with Dr. Audie Box 10/21

## 2021-03-23 NOTE — Assessment & Plan Note (Signed)
Numerous studies investigating possibility of cardiac amyloidosis completed, no evidence of such.  Severe hypertrophic CM (chronic, persistent), but new decline in systolic fxn (EF 73%) per recent cardiac MRI 9/7.  Clinically compensated.  F/u with cardiology next month.

## 2021-03-23 NOTE — Assessment & Plan Note (Signed)
Today Cr. 1.21, eGFR 62. Stable.

## 2021-03-23 NOTE — Assessment & Plan Note (Signed)
Wt increasing slowly to 159 from wt loss nadir of around 150.  Monitor.

## 2021-04-04 NOTE — Progress Notes (Signed)
Cardiology Office Note:   Date:  04/05/2021  NAME:  Dylan [name]    MRN: [id number] DOB:  [birth year]   PCP:  Angelica Pou, MD  Cardiologist:  None  Electrophysiologist:  None   Referring MD: Angelica Pou, MD   Chief Complaint  Patient presents with   Follow-up    History of Present Illness:   Dylan [name] is a 75 y.o. male with a hx of HTN, CKD3, hypertrophic CM, Afib, CHB who presents for follow-up. Echo was concerning for cardiac amyloidosis but PYP negative. CMR shows hypertrophic CM. Evaluated for syncope and weight loss that has been attributed to trulicity.  He reports he is doing well.  Denies any further syncopal episodes.  No rapid heart rates either.  I did go over the results of his testing.  His EF is around 25%.  Highly suspect this is related to hypertensive heart disease.  Also found to have concerns for hypertrophic cardiomyopathy.  He is on Eliquis for A. fib but reports the cost is an issue.  We will have him fill out patient assistance.  He has no evidence of cardiac amyloidosis.  He reports he is feeling great since gaining weight.  He has been off Trulicity.  I did inform him we need to start him on a stable heart failure regimen.  He is okay to change medications today.  I also discussed his monitor which showed nocturnal complete heart block.  Pacemaker is likely recommended as he will need to be on beta-blockers and AV nodal agents.  He reports he would like to hold on this for now as he has felt fine.  I think this is reasonable.  Overall doing well.  Denies any symptoms in office.  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 CHB detected on monitor 4. Diabetes -A1c 8.7 5. HTN 6. CKD 3 7. Prior tobacco abuse -20 pack years  8. Pulmonary hypertension 9. OSA   Past Medical History: Past Medical History:  Diagnosis Date   Anemia 10/18/2014   Baseline about 12 and stable from 2010 to 2016.  Colon 2009 in [place] (records cannot be obtained).  EGD Dr Benson Norway 2011 nl    Aortic atherosclerosis ([place]) 03/28/2020   Incidental finding on imaging CT    Atherosclerosis 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 ([place]) 12/28/2014   ABI Feb 2017 R 0.49; L 0.72 with diffuse dz ABI Aug 2017 R 0.49; L 0.69 ABI Jan 2018 R 0.61; L 0.73  ABI Feb 2019 R 0.55; L 0.71 Sees Dr Bridgett Larsson - recs ABI q 6 months   Chronic diastolic 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 ([place]) 10/18/2014   Noted ECHO 10/2014. Grade 2. EF 50-55%; echo repeated 2019, severe hypertrophy with elevated filling pressures   Chronic kidney disease, stage 3b ([place]) 03/09/2020   Dr. Justin Mend  Nephrologist, f/u Q 52M   Coronary artery disease without angina pectoris    Degloving injury of finger 03/28/2020   10/21/19: copied from op note "1.  Open reduction percutaneous pinning left small finger proximal phalanx fracture 2.  Complex repair of laceration to left small finger 3 cm in length 3.  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 No significant callus formation is noted. Pins had been removed   Diverticulosis 10/08/2014   Seen on CT. Reportedly on Colon in [place]  in 2009. Freq bouts of diverticulitis.   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 Stable from 2010 to 2016.  Negative SPEP and UPEP 2014 after ARF 2/2 continued ACE (lisinopril 20) use while vol contracted. 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 Failed CPAP on study. BiPA  inspiratory pressure of 21 and expiratory pressure of 17 CWP. Consider ENT evaluation for potentially correctable upper airway obstruction contributing to the need for unusually high pressures - ENT July 2015 did not feel any intervention surgically was indicated. He wore a large F&P Simplus fullfac   Personal history of colonic polyps 03/28/2014   Dr. Benson Norway, 2 polyps removed 2015.  No polyps on f/u in 2020.  No further surveillance suggested per Dr. Benson Norway.   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  He is unable to breathe through his nose comfortably.  This is part of the reason why he does not wear his oxygen regularly.  Nasal passages are nearly completely obstructed erythematous smooth glistening surface.  He has been told by his eye doctor that part of the reason his e   Resistant hypertension 10/09/2014   Poor control with 6 drug therapy. 2016 : Aldo 37 but ARR 23.5    Severe pulmonary arterial systolic hypertension ([place]) 10/18/2014   Noted as severe ECHO 2016 and 2019. Likely 2/2 severe untreated OSA. Pt is not adherent to CPAP.   Syncope 12/31/2020   Thrombocytopenia ([place]) 10/09/2016   Nl liver and spleen on [place] 2019   Ulcer aphthous oral 04/27/2020   Evaluated emergency room last week, saw with Magic mouthwash    Past Surgical History: Past Surgical History:  Procedure Laterality Date   CHOLECYSTECTOMY     COLONOSCOPY WITH PROPOFOL N/A 02/12/2019   Procedure: COLONOSCOPY WITH PROPOFOL;  Surgeon: Carol Ada, MD;  Location: [facility];  Service: Endoscopy;  Laterality: N/A;   PERCUTANEOUS PINNING Left 10/21/2019   Procedure: CLOSED REDUCTION WITH PERCUTANEOUS PINNING AND SUTURE REPAIR OF LACERATION  OF SMALL FINGER,  SUTURE REPAIR OF LACERATION OF INDEX FINGER;  Surgeon: Cindra Presume, MD;  Location: [facility];  Service: Plastics;  Laterality: Left;     Current Medications: Current Meds  Medication Sig   carvedilol (COREG) 12.5 MG tablet Take 1 tablet (12.5 mg total) by mouth 2 (two) times daily.   losartan (COZAAR) 25 MG tablet Take 1 tablet (25 mg total) by mouth daily.     Allergies:    Ace inhibitors and Spironolactone   Social History: Social History   Socioeconomic History   Marital status: Divorced    Spouse name: Not on file   Number of children: Not on file   Years of education: Not on file   Highest education level: Not on file  Occupational History   Not on file  Tobacco Use   Smoking status: Former    Years: 20.00    Types: Cigarettes    Quit date: 10/02/1972    Years since quitting: 48.5   Smokeless  tobacco: Never  Vaping Use   Vaping Use: Never used  Substance and Sexual Activity   Alcohol use: No    Alcohol/week: 0.0 standard drinks   Drug use: No   Sexual activity: Never  Other Topics Concern   Not on file  Social History Narrative   Worked in Civil engineer, 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 Had yearly occupational testing inc PFT's. Now retired. Divorced. Has male sig other. Likes to go fishing.      He starting drinking as a teen and would drink to passing out. Couldn't keep job, have family. He quit drinking and smoking cold [place] with help of his faith. No ETOH since about age 29ish      Total of 7 brothers and 6 sisters.   Social Determinants of Health   Financial Resource Strain: Not on file  Food Insecurity: Not on file  Transportation Needs: Not on file  Physical Activity: Not on file  Stress: Not on file  Social Connections: Not on file     Family History: The patient's family history includes CAD in his sister; Heart failure in his sister.  ROS:   All other ROS reviewed and negative. Pertinent positives noted in the HPI.     EKGs/Labs/Other Studies Reviewed:   The following studies were personally reviewed by me today:   TTE 01/01/2021    1. Left ventricular ejection fraction, by estimation,  is 50%. The left  ventricle has mildly decreased function. The left ventricle demonstrates  global hypokinesis. There is severe concentric left ventricular  hypertrophy. Left ventricular diastolic  parameters are indeterminate. Regional strain foreshortened and suboptimal  in nature.   2. A moderate pericardial effusion is present. The pericardial effusion  is circumferential. There is no evidence of increased pericardial  pressures.   3. Right ventricular systolic function is normal. The right ventricular  size is normal.   4. Left atrial size was moderately dilated.   5. The mitral valve is normal in structure. No evidence of mitral valve  regurgitation. No evidence of mitral stenosis.   6. The aortic valve is tricuspid. There is moderate calcification of the  aortic valve. Aortic valve regurgitation is not visualized. Mild aortic  valve sclerosis is present, with no evidence of aortic valve stenosis.  Recent Labs: 12/31/2020: B Natriuretic Peptide 855.9; Magnesium 2.0; TSH 0.677 02/14/2021: ALT 12; Hemoglobin 13.3; Platelet Count 207 03/15/2021: BUN 32; Creatinine, Ser 1.21; Potassium 3.4; Sodium 137   Recent Lipid Panel    Component Value Date/Time   CHOL 125 09/14/2020 1005   TRIG 57 09/14/2020 1005   HDL 38 (L) 09/14/2020 1005   CHOLHDL 3.3 09/14/2020 1005   CHOLHDL 4.6 01/12/2015 1032   VLDL 17 01/12/2015 1032   LDLCALC 75 09/14/2020 1005    Physical Exam:   VS:  BP 140/82   Pulse 93   Ht _0  (1.676 m)   Wt 157 lb 3.2 oz (71.3 kg)   SpO2 97%   BMI 25.37 kg/m    Wt Readings from Last 3 Encounters:  04/05/21 157 lb 3.2 oz (71.3 kg)  03/15/21 159 lb 1.6 oz (72.2 kg)  03/09/21 149 lb (67.6 kg)    General: Well nourished, well developed, in no acute distress Head: Atraumatic, normal size  Eyes: PEERLA, EOMI  Neck: Supple, no JVD Endocrine: No thryomegaly Cardiac: Normal S1, S2; RRR; no murmurs, rubs, or gallops Lungs: Clear to auscultation bilaterally, no  wheezing, rhonchi or rales  Abd: Soft, nontender, no hepatomegaly  Ext:  No edema, pulses 2+ Musculoskeletal: No deformities, BUE and BLE strength normal and equal Skin: Warm and dry, no rashes   Neuro: Alert and oriented to person, place, time, and situation, CNII-XII grossly intact, no focal deficits  Psych: Normal mood and affect   ASSESSMENT:   Dylan [name] is a 75 y.o. male who presents for the following: 1. Hypertrophic cardiomyopathy ([place])   2. Chronic systolic heart failure ([name])   3. Persistent atrial fibrillation ([place])   4. Orthostatic hypotension   5. Primary hypertension   6. CHB (complete heart block) ([name])   7. RBBB     PLAN:   1. Hypertrophic cardiomyopathy ([place]) 2. Chronic systolic heart failure ([name]) -Hypertrophic cardiomyopathy on cardiac MRI.  No evidence of cardiac amyloidosis.  Weight gain enhancement 3%.  No VT on recent heart monitor.  He was having syncopal episodes but these were attributed to weight loss and Trulicity.  Since coming off these medications he is had no further episodes.  I do not believe this was an arrhythmia related event. -He is intolerant of ACE inhibitors.  We will try him on losartan 25 mg daily.  Transition metoprolol to tartrate to Coreg 12.5 mg twice daily.  Monitor did show nocturnal complete heart block.  No symptoms were reported.  For now we will continue this and keep an eye on symptoms.  He is not interested in a pacemaker at this time.  Symptoms appear to resolved. -He is euvolemic on exam.  Not requiring any Lasix. -Really unclear etiology.  I would suspect this to be more consistent with hypertensive heart disease.  It is reassuring he does not have amyloidosis.  This could be hypertrophic cardiomyopathy.  However he appears to be low risk for sudden death.  I think I would like to trial him on 3 to 6 months of medical therapy to see if his EF recovers before we consider ICD.  We could also consider an ischemia evaluation if needed.   To me this is likely just hypertensive heart disease.  Of note EF is not that reliable on MRI when arrhythmia is present.  This was commented on by the reader.  3. Persistent atrial fibrillation ([name]) -Continue Eliquis 5 mg twice daily.  Patient assistance forms given.  Neck step would be Coumadin if he cannot afford this.  4. Orthostatic hypotension -Resolved.  5. Primary hypertension -Stop amlodipine and metoprolol.  Start Coreg and losartan.  6. CHB (complete heart block) ([place]) 7. RBBB -Nocturnal complete heart block that was brief.  No further symptoms.  He would like to hold off on pacemaker therapy.  We will see how he does.  I suspect he will need a pacemaker at some point but he is not wanting to do this at this time.     Disposition: Return in about 3 months (around 07/05/2021).  Medication Adjustments/Labs and Tests Ordered: Current medicines are reviewed at length with the patient today.  Concerns regarding medicines are outlined above.  No orders of the defined types were placed in this encounter.  Meds ordered this encounter  Medications   carvedilol (COREG) 12.5 MG tablet    Sig: Take 1 tablet (12.5 mg total) by mouth 2 (two) times daily.    Dispense:  180 tablet    Refill:  3   losartan (COZAAR) 25 MG tablet    Sig: Take 1 tablet (25 mg total) by mouth daily.    Dispense:  90 tablet    Refill:  3  Patient Instructions  Medication Instructions:  STOP AMLODIPINE STOP METOPROLOL START COREG 12.5 twice daily  START LOSARTAN 25 mg daily   *If you need a refill on your cardiac medications before your next appointment, please call your pharmacy*   Follow-Up: At [facility], you and your health needs are our priority.  As part of our continuing mission to provide you with exceptional heart care, we have created designated Provider Care Teams.  These Care Teams include your primary Cardiologist (physician) and Advanced Practice Providers (APPs -  Physician  Assistants and Nurse Practitioners) who all work together to provide you with the care you need, when you need it.  We recommend signing up for the patient portal called "MyChart".  Sign up information is provided on this After Visit Summary.  MyChart is used to connect with patients for Virtual Visits (Telemedicine).  Patients are able to view lab/test results, encounter notes, upcoming appointments, etc.  Non-urgent messages can be sent to your provider as well.   To learn more about what you can do with MyChart, go to [email or web address].    Your next appointment:   3 month(s)  The format for your next appointment:   In Person  Provider:   Eleonore Chiquito, MD     Time Spent with Patient: I have spent a total of 35 minutes with patient reviewing hospital notes, telemetry, EKGs, labs and examining the patient as well as establishing an assessment and plan that was discussed with the patient.  > 50% of time was spent in direct patient care.  Signed, Addison Naegeli. Audie Box, MD, [facility]  [address], [place], [place] [address] [phone]  04/05/2021 10:59 AM

## 2021-04-05 ENCOUNTER — Ambulatory Visit (INDEPENDENT_AMBULATORY_CARE_PROVIDER_SITE_OTHER): Payer: Medicare Other | Admitting: Cardiovascular Disease

## 2021-04-05 ENCOUNTER — Other Ambulatory Visit: Payer: Self-pay

## 2021-04-05 ENCOUNTER — Encounter: Payer: Self-pay | Admitting: Cardiovascular Disease

## 2021-04-05 VITALS — BP 140/82 | HR 93 | Ht 66.0 in | Wt 157.2 lb

## 2021-04-05 DIAGNOSIS — I10 Essential (primary) hypertension: Secondary | ICD-10-CM | POA: Diagnosis not present

## 2021-04-05 DIAGNOSIS — I4510 Unspecified right bundle-branch block: Secondary | ICD-10-CM

## 2021-04-05 DIAGNOSIS — I5022 Chronic systolic (congestive) heart failure: Secondary | ICD-10-CM | POA: Diagnosis not present

## 2021-04-05 DIAGNOSIS — I442 Atrioventricular block, complete: Secondary | ICD-10-CM

## 2021-04-05 DIAGNOSIS — I422 Other hypertrophic cardiomyopathy: Secondary | ICD-10-CM

## 2021-04-05 DIAGNOSIS — I4819 Other persistent atrial fibrillation: Secondary | ICD-10-CM

## 2021-04-05 DIAGNOSIS — I951 Orthostatic hypotension: Secondary | ICD-10-CM

## 2021-04-05 MED ORDER — LOSARTAN POTASSIUM 25 MG PO TABS: 25.0000 mg | ORAL_TABLET | Freq: Every day | ORAL | 3 refills | Status: DC

## 2021-04-05 MED ORDER — CARVEDILOL 12.5 MG PO TABS: 12.5000 mg | ORAL_TABLET | Freq: Two times a day (BID) | ORAL | 3 refills | Status: DC

## 2021-04-05 NOTE — Patient Instructions (Signed)
Medication Instructions:  STOP AMLODIPINE STOP METOPROLOL START COREG 12.5 twice daily  START LOSARTAN 25 mg daily   *If you need a refill on your cardiac medications before your next appointment, please call your pharmacy*   Follow-Up: At [facility], you and your health needs are our priority.  As part of our continuing mission to provide you with exceptional heart care, we have created designated Provider Care Teams.  These Care Teams include your primary Cardiologist (physician) and Advanced Practice Providers (APPs -  Physician Assistants and Nurse Practitioners) who all work together to provide you with the care you need, when you need it.  We recommend signing up for the patient portal called "MyChart".  Sign up information is provided on this After Visit Summary.  MyChart is used to connect with patients for Virtual Visits (Telemedicine).  Patients are able to view lab/test results, encounter notes, upcoming appointments, etc.  Non-urgent messages can be sent to your provider as well.   To learn more about what you can do with MyChart, go to [email or web address].    Your next appointment:   3 month(s)  The format for your next appointment:   In Person  Provider:   Eleonore Chiquito, MD

## 2021-04-06 NOTE — Progress Notes (Signed)
Discussed in office. Nocturnal CHB. No symptoms currently. He wishes to hold off on pacemaker.   [place] T. Audie Box, MD, [place]  [address] [place], [place] [address] [phone]  7:03 AM

## 2021-04-09 ENCOUNTER — Ambulatory Visit (INDEPENDENT_AMBULATORY_CARE_PROVIDER_SITE_OTHER): Payer: Medicare Other | Admitting: Student

## 2021-04-09 ENCOUNTER — Encounter: Payer: Self-pay | Admitting: Student

## 2021-04-09 VITALS — BP 173/91 | HR 55 | Temp 98.7°F | Wt 158.6 lb

## 2021-04-09 DIAGNOSIS — N6342 Unspecified lump in left breast, subareolar: Secondary | ICD-10-CM

## 2021-04-09 DIAGNOSIS — I70213 Atherosclerosis of native arteries of extremities with intermittent claudication, bilateral legs: Secondary | ICD-10-CM

## 2021-04-09 DIAGNOSIS — N62 Hypertrophy of breast: Secondary | ICD-10-CM

## 2021-04-09 HISTORY — DX: Hypertrophy of breast: N62

## 2021-04-09 NOTE — Assessment & Plan Note (Signed)
Patient presenting to the [facility] with c/o a new lump in his left breast. States that he first noticed lump 3 weeks ago and initially thought he scratched his chest. However, the lump remained after 2 weeks and he began noticed tenderness when he palpated it. He denies any nipple discharge or induration in the area. When palpating the lump, he notes 9/10 pain but there is no pain when he does not touch it. Otherwise, he feels good and his weight has been increasing.  Of note, patient does have a sister with history of breast cancer who passed away from it.   On exam, left breast appears visibly larger than right. On palpation, there is a lump felt in the areolar region of left breast (likely around ductal area). It is not mobile and is tender to palpation. No nipple discharge, induration, or regional axillary lymphadenopathy noted.  Given asymmetry and sudden onset of lump along with first degree relative with history of breast cancer, I believe it is reasonable to rule out breast malignancy. I would expect gynecomastia to be bilateral and to develop over a period of time and furthermore, patient is not on any medications that would cause for development of gynecomastia.   Ordered breast ultrasound and diagnostic mammogram for further evaluation of breast lump, which will be performed on 04/11/2021 at 1:15PM.  Plan: -f/u results of [place] and diagnostic mammogram of left breast lump -f/u in 3 weeks to evaluate symptoms and determine subsequent steps in management (depending on results)

## 2021-04-09 NOTE — Progress Notes (Signed)
CC: left breast lump  HPI:  [name] is a 75 y.o. male with history listed below presenting to the [facility] for evaluation of left breast lump. Please see individualized problem based charting for full HPI.  Past Medical History:  Diagnosis Date   Anemia 10/18/2014   Baseline about 12 and stable from 2010 to 2016. Colon 2009 in [place] (records cannot be obtained).  EGD Dr Benson Norway 2011 nl    Aortic atherosclerosis ([place]) 03/28/2020   Incidental finding on imaging CT    Atherosclerosis 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 ([place]) 12/28/2014   ABI Feb 2017 R 0.49; L 0.72 with diffuse dz ABI Aug 2017 R 0.49; L 0.69 ABI Jan 2018 R 0.61; L 0.73  ABI Feb 2019 R 0.55; L 0.71 Sees Dr Bridgett Larsson - recs ABI q 6 months   Chronic diastolic 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 ([place]) 10/18/2014   Noted ECHO 10/2014. Grade 2. EF 50-55%; echo repeated 2019, severe hypertrophy with elevated filling pressures   Chronic kidney disease, stage 3b ([place]) 03/09/2020   Dr. Justin Mend  Nephrologist, f/u Q 29M   Coronary artery disease without angina pectoris    Degloving injury of finger 03/28/2020   10/21/19: copied from op note "1.  Open reduction percutaneous pinning left small finger proximal phalanx fracture 2.  Complex repair of laceration to left small finger 3 cm in length 3.  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 No significant callus formation is noted. Pins had been removed   Diverticulosis 10/08/2014   Seen on CT. Reportedly on Colon in [place] in 2009. Freq bouts of diverticulitis.   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 Stable from 2010 to 2016.   Negative SPEP and UPEP 2014 after ARF 2/2 continued ACE (lisinopril 20) use while vol contracted. 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 Failed CPAP on study. BiPA inspiratory pressure of 21 and expiratory pressure of 17 CWP. Consider ENT evaluation for potentially correctable upper airway obstruction contributing to the need for unusually high pressures - ENT July 2015 did not feel any intervention surgically was indicated. He wore a large F&P Simplus fullfac   Personal history of colonic polyps 03/28/2014   Dr. Benson Norway, 2 polyps removed 2015.  No polyps on f/u in 2020.  No further surveillance suggested per Dr. Benson Norway.   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  He is unable to breathe through his nose comfortably.  This is part of the reason why he does not wear his oxygen regularly.  Nasal passages are nearly completely obstructed erythematous smooth glistening surface.  He has been told by his eye doctor that part of the reason his e   Resistant hypertension 10/09/2014   Poor control with 6 drug therapy. 2016 : Aldo 37 but ARR 23.5    Severe pulmonary arterial systolic hypertension ([place]) 10/18/2014   Noted  as severe ECHO 2016 and 2019. Likely 2/2 severe untreated OSA. Pt is not adherent to CPAP.   Syncope 12/31/2020   Thrombocytopenia ([place]) 10/09/2016   Nl liver and spleen on [place] 2019   Ulcer aphthous oral 04/27/2020   Evaluated emergency room last week, saw with Magic mouthwash    Review of Systems:  Negative aside from that listed in individualized problem based charting.  Physical Exam:  Vitals:   04/09/21 1313  BP: (!) 173/91  Pulse: (!) 55  Temp: 98.7 F (37.1 C)  TempSrc: Oral  SpO2: 100%  Weight:  158 lb 9.6 oz (71.9 kg)   Physical Exam Constitutional:      Appearance: Normal appearance. He is not ill-appearing.  HENT:     Nose: Nose normal. No congestion.     Mouth/Throat:     Mouth: Mucous membranes are moist.     Pharynx: Oropharynx is clear. No oropharyngeal exudate.  Eyes:     Extraocular Movements: Extraocular movements intact.     Conjunctiva/sclera: Conjunctivae normal.     Pupils: Pupils are equal, round, and reactive to light.  Cardiovascular:     Rate and Rhythm: Normal rate and regular rhythm.     Pulses: Normal pulses.     Heart sounds: Normal heart sounds. No murmur heard.   No friction rub. No gallop.  Pulmonary:     Effort: Pulmonary effort is normal.     Breath sounds: Normal breath sounds. No wheezing, rhonchi or rales.  Abdominal:     General: Bowel sounds are normal. There is no distension.     Palpations: Abdomen is soft.     Tenderness: There is no abdominal tenderness.  Skin:    General: Skin is warm and dry.     Comments: Breast lump behind areola on left side. No redness or discharge noted at left nipple. Tenderness to palpation of lump. No axillary lymphadenopathy noted on exam. Right breast with no lump or tenderness noted.  Neurological:     General: No focal deficit present.     Mental Status: He is alert and oriented to person, place, and time.  Psychiatric:        Mood and Affect: Mood normal.        Behavior: Behavior normal.     Assessment & Plan:   See Encounters Tab for problem based charting.  Patient discussed with Dr. Daryll Drown

## 2021-04-09 NOTE — Patient Instructions (Addendum)
Dylan [name],  It was a pleasure seeing you in the clinic today.   I have scheduled a breast ultrasound and mammogram for you to further evaluate the breast lump. The appointment is on Wednesday at 1:15PM. I will give you a call with the results of the breast imaging once I have them.  Please call our clinic at [phone] if you have any questions or concerns. The best time to call is Monday-Friday from 9am-4pm, but there is someone available 24/7 at the same number. If you need medication refills, please notify your pharmacy one week in advance and they will send [place] a request.   Thank you for letting us take part in your care. We look forward to seeing you next time!

## 2021-04-11 DIAGNOSIS — N62 Hypertrophy of breast: Secondary | ICD-10-CM | POA: Diagnosis not present

## 2021-04-11 DIAGNOSIS — N644 Mastodynia: Secondary | ICD-10-CM | POA: Diagnosis not present

## 2021-04-11 NOTE — Progress Notes (Signed)
Internal Medicine Clinic Attending  I saw and evaluated the patient.  I personally confirmed the key portions of the history and exam documented by Dr. Allyson Sabal and I reviewed pertinent patient test results.  The assessment, diagnosis, and plan were formulated together and I agree with the documentation in the resident's note.   Breast mass was tender, directly under areola, no nipple discharge, approx 2cm X 1 cm.  Mobile.  He will need mammogram and ultrasound.  He has one sister who passed from breast cancer.

## 2021-04-11 NOTE — Addendum Note (Signed)
Addended by: Marcelino Duster on: 04/11/2021 01:28 PM   Modules accepted: Orders

## 2021-04-27 ENCOUNTER — Ambulatory Visit: Payer: 59 | Admitting: Cardiovascular Disease

## 2021-04-30 ENCOUNTER — Other Ambulatory Visit: Payer: Self-pay | Admitting: Internal Medicine

## 2021-04-30 ENCOUNTER — Encounter: Payer: Self-pay | Admitting: Student

## 2021-04-30 ENCOUNTER — Ambulatory Visit (INDEPENDENT_AMBULATORY_CARE_PROVIDER_SITE_OTHER): Payer: Medicare Other | Admitting: Student

## 2021-04-30 VITALS — BP 180/92 | HR 68 | Temp 98.5°F | Wt 161.4 lb

## 2021-04-30 DIAGNOSIS — I129 Hypertensive chronic kidney disease with stage 1 through stage 4 chronic kidney disease, or unspecified chronic kidney disease: Secondary | ICD-10-CM | POA: Diagnosis not present

## 2021-04-30 DIAGNOSIS — E876 Hypokalemia: Secondary | ICD-10-CM

## 2021-04-30 DIAGNOSIS — N6342 Unspecified lump in left breast, subareolar: Secondary | ICD-10-CM | POA: Diagnosis not present

## 2021-04-30 DIAGNOSIS — E1122 Type 2 diabetes mellitus with diabetic chronic kidney disease: Secondary | ICD-10-CM

## 2021-04-30 DIAGNOSIS — J069 Acute upper respiratory infection, unspecified: Secondary | ICD-10-CM | POA: Diagnosis not present

## 2021-04-30 MED ORDER — LOSARTAN POTASSIUM 50 MG PO TABS: 50.0000 mg | ORAL_TABLET | Freq: Every day | ORAL | 11 refills | Status: DC

## 2021-04-30 MED ORDER — FLUTICASONE PROPIONATE 50 MCG/ACT NA SUSP
2.0000 | Freq: Every day | NASAL | 0 refills | Status: DC
Start: 2021-04-30 — End: 2021-10-11

## 2021-04-30 MED ORDER — CARVEDILOL 25 MG PO TABS: 25.0000 mg | ORAL_TABLET | Freq: Two times a day (BID) | ORAL | 11 refills | Status: DC

## 2021-04-30 NOTE — Progress Notes (Signed)
CC: cough/phlegm for the past week  HPI:  [name] is a 75 y.o. male with history listed below presenting to the [facility] for cough/phlegm production for the past week. Please see individualized problem based charting for full HPI.  Past Medical History:  Diagnosis Date   Anemia 10/18/2014   Baseline about 12 and stable from 2010 to 2016. Colon 2009 in [place] (records cannot be obtained).  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 Grade 2. EF 50-55%; echo repeated 2019, severe hypertrophy with elevated filling pressures   Chronic kidney disease, stage 3b ([place]) 03/09/2020   Dr. Justin Mend  Nephrologist, f/u Q 36M   Coronary artery disease without angina pectoris    Degloving injury of finger 03/28/2020   10/21/19: copied from op note "1.  Open reduction percutaneous pinning left small finger proximal phalanx fracture 2.  Complex repair of laceration to left small finger 3 cm in length 3.  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 No significant callus formation is noted. Pins had been removed   Diverticulosis 10/08/2014   Seen on CT. Reportedly on Colon in [place] in 2009. Freq bouts of diverticulitis.   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  Stable from 2010 to 2016.  Negative SPEP and UPEP 2014 after ARF 2/2 continued ACE (lisinopril 20) use while vol contracted. 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 Failed CPAP on study. BiPA inspiratory pressure of 21 and expiratory pressure of 17 CWP. Consider ENT evaluation for potentially correctable upper airway obstruction contributing to the need for unusually high pressures - ENT July 2015 did not feel any intervention surgically was indicated. He wore a large F&P Simplus fullfac   Personal history of colonic polyps 03/28/2014   Dr. Benson [place], 2 polyps removed 2015.  No polyps on f/u in 2020.  No further surveillance suggested per Dr. Benson [place].   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  He is unable to breathe through his nose comfortably.  This is part of the reason why he does not wear his oxygen regularly.  Nasal passages are nearly completely obstructed erythematous smooth glistening surface.  He has been told by his eye doctor that part of the reason his e   Resistant hypertension 10/09/2014   Poor control with 6 drug therapy. 2016 : Aldo 37 but ARR 23.5    Severe pulmonary arterial systolic hypertension ([place]) 10/18/2014  Noted as severe ECHO 2016 and 2019. Likely 2/2 severe untreated OSA. Pt is not adherent to CPAP.   Syncope 12/31/2020   Thrombocytopenia ([place]) 10/09/2016   Nl liver and spleen on [place] 2019   Ulcer aphthous oral 04/27/2020   Evaluated emergency room last week, saw with Magic mouthwash    Review of Systems:  Negative aside from that listed in individualized problem based charting.  Physical Exam:  Vitals:   04/30/21 0957  BP: (!) 203/109  Pulse: 81  Temp: 98.5 F (36.9 C)  TempSrc: Oral   SpO2: 98%  Weight: 161 lb 6.4 oz (73.2 kg)   Physical Exam Constitutional:      Appearance: Normal appearance.  HENT:     Nose: Nose normal. No congestion.     Mouth/Throat:     Mouth: Mucous membranes are moist.     Pharynx: Oropharynx is clear. No oropharyngeal exudate.  Eyes:     Extraocular Movements: Extraocular movements intact.     Conjunctiva/sclera: Conjunctivae normal.     Pupils: Pupils are equal, round, and reactive to light.  Cardiovascular:     Rate and Rhythm: Normal rate and regular rhythm.     Pulses: Normal pulses.     Heart sounds: Normal heart sounds.    No friction rub. No gallop.  Pulmonary:     Effort: Pulmonary effort is normal.     Breath sounds: Normal breath sounds. No wheezing, rhonchi or rales.  Skin:    General: Skin is warm and dry.  Neurological:     General: No focal deficit present.     Mental Status: He is alert and oriented to person, place, and time.  Psychiatric:        Mood and Affect: Mood normal.        Behavior: Behavior normal.     Assessment & Plan:   See Encounters Tab for problem based charting.  Patient discussed with Dr. Philipp Ovens

## 2021-04-30 NOTE — Assessment & Plan Note (Addendum)
Vitals:   04/30/21 0957 04/30/21 1020  BP: (!) 203/109 (!) 180/92   Patient with history of HTN, on losartan 35m and coreg 12.573mBID. BP is severely elevated today, but he is asymptomatic. Reports that his home BP has been ranging up to 19022Hystolic. Losartan was recently started by cardiology at the end of September, with minimal improvement in BP. Will increase losartan to 5013maily and coreg to 45m22mD. He had a BMP at the beginning of the month, but will need to check again at next visit in 3 weeks. Of note, patient has developed AKI in the past with ACEI/ARB therapy so if kidney function worsens on next visit, will need to consider stopping losartan therapy. He has had success with norvasc in the past so consider reinitiating this if losartan needs to be stopped.  Will also check renin-aldo level today to evaluate for secondary causes of hypertension given concomitant chronic mild hypokalemia. Of note, patient does have a new diagnosis of gynecomastia (not medication related), so will need to be cautious of starting spironolactone.  Plan: -increased losartan to 50mg52mly and coreg to 45mg 54m-f/u renin-aldo level -check BMP at next visit, if new AKI > consider stopping losartan and adding norvasc instead

## 2021-04-30 NOTE — Assessment & Plan Note (Signed)
Patient with one week history of mild cough with clear phlegm production. No fevers, chills, sore throat, weakness, changes in urination, changes in bowel movements. He is requesting medications to help soothe his cough and to thin the phlegm so that he can cough it out. He states that hot tea has been helping him at home. He likely has a viral URI which will resolve spontaneously. Will provide symptomatic management with flonase nasal spray and mucinex.  Plan: -mucinex and flonase -f/u in 3 weeks

## 2021-04-30 NOTE — Assessment & Plan Note (Signed)
Ultrasound and mammogram showing findings consistent with gynecomastia with no signs of malignancy. Unclear etiology for unilateral gynecomastia in this elderly male. He is not on any medications linked to gynecomastia. Further workup is not indicated at this time (prolactin, LH, etc), but can be pursued should patient develop worsening symptoms in the future.

## 2021-04-30 NOTE — Patient Instructions (Addendum)
[name],  It was a pleasure seeing you in the clinic today.   I have increased your blood pressure medications. Please pick up the new prescriptions from your pharmacy. I have prescribed Flonase to help with your phlegm production. Please also pick up Mucinex from any pharmacy to help with your cough. Please come back in 3 weeks for a follow up visit to recheck your blood pressure.  Please call our clinic at [phone] if you have any questions or concerns. The best time to call is Monday-Friday from 9am-4pm, but there is someone available 24/7 at the same number. If you need medication refills, please notify your pharmacy one week in advance and they will send [place] a request.   Thank you for letting us take part in your care. We look forward to seeing you next time!

## 2021-05-02 NOTE — Progress Notes (Addendum)
Patient called.  Patient aware. Renin-aldo level elevated. He would benefit from spironolactone therapy, but unfortunately, patient with recent diagnosis of gynecomastia so spironolactone may not be ideal.  Discussed keeping a BP log for the next couple of weeks and to bring to next visit in mid-November. He confirmed understanding and states that he picked up the new prescriptions for his antihypertensives.

## 2021-05-03 ENCOUNTER — Telehealth: Payer: Self-pay

## 2021-05-03 LAB — ALDOSTERONE + RENIN ACTIVITY W/ RATIO
ALDOS/RENIN RATIO: 112.4 — ABNORMAL HIGH (ref 0.0–30.0)
ALDOSTERONE: 23.6 ng/dL (ref 0.0–30.0)
Renin: 0.21 ng/mL/hr (ref 0.167–5.380)

## 2021-05-03 NOTE — Telephone Encounter (Signed)
Received information that patient did not get approved for patient assistance. Will scan into chart, they were not approved for not having documentation of the 3% out of pocket prescription expenses based on household adjusted gross income, not met.

## 2021-05-11 NOTE — Progress Notes (Signed)
Internal Medicine Clinic Attending  Case discussed with Dr. Allyson Sabal  At the time of the visit.  We reviewed the resident's history and exam and pertinent patient test results.  I agree with the assessment, diagnosis, and plan of care documented in the resident's note.   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 Agree he would benefit from spironolactone pending further work up for his gynecomastia.

## 2021-05-14 ENCOUNTER — Other Ambulatory Visit: Payer: Self-pay

## 2021-05-14 NOTE — Telephone Encounter (Signed)
apixaban (ELIQUIS) 5 MG TABS tablet, refill request @ [place], [place].

## 2021-05-15 ENCOUNTER — Other Ambulatory Visit: Payer: Self-pay | Admitting: Cardiovascular Disease

## 2021-05-15 NOTE — Telephone Encounter (Signed)
Prescription refill request for Eliquis received.  Indication: afib  Last office visit: 04/05/2021, O neal Scr: 1.21, 03/15/2021 Age: 75 yo  Weight: 73.2 kg   Refill sent.

## 2021-06-07 ENCOUNTER — Other Ambulatory Visit: Payer: Self-pay

## 2021-06-07 DIAGNOSIS — E1122 Type 2 diabetes mellitus with diabetic chronic kidney disease: Secondary | ICD-10-CM

## 2021-06-07 NOTE — Telephone Encounter (Signed)
FREESTYLE LITE test strip, refill request @ [address] [place], [place]

## 2021-06-08 MED ORDER — FREESTYLE LITE TEST VI STRP: ORAL_STRIP | 1 refills | Status: DC

## 2021-06-26 ENCOUNTER — Telehealth: Payer: Self-pay | Admitting: Cardiovascular Disease

## 2021-06-26 NOTE — Telephone Encounter (Signed)
Pre-operative Risk Assessment    Patient Name: Dylan [name]  DOB: Mar 23, 1946 MRN: [id number]      Request for Surgical Clearance    Procedure:   outpatient surgery in the left eye  Date of Surgery:  Clearance TBD                              Surgeon:  Sherlynn Stalls, MD Surgeon's Group or Practice Name:  [facility] Phone number:  [phone] Fax number:  [phone]   Type of Clearance Requested:   - Medical    Type of Anesthesia:  MAC   Additional requests/questions:    Dylan [name]   06/26/2021, 5:07 PM

## 2021-06-28 NOTE — Telephone Encounter (Signed)
Patient has an upcoming appointment with Dr. Audie Box on 07/16/21. Due to concerns with patient's condition at appointment on 04/05/21, will route message to Dr. Audie Box to address clearance at that appointment.  Pharmacy has noted the following regarding patient's Eliquis:  Patient with diagnosis of afib on Eliquis for anticoagulation.   Procedure: surgery of left eye, no other details provided Date of procedure: TBD   CHA2DS2-VASc Score = 6  This indicates a 9.7% annual risk of stroke. The patient's score is based upon: CHF History: 1 HTN History: 1 Diabetes History: 1 Stroke History: 0 Vascular Disease History: 1 Age Score: 2 Gender Score: 0   CrCl 68m/min Platelet count 207K Per office protocol, patient can hold Eliquis for 1-2 days prior to procedure.

## 2021-06-28 NOTE — Telephone Encounter (Signed)
Patient with diagnosis of afib on Eliquis for anticoagulation.    Procedure: surgery of left eye, no other details provided Date of procedure: TBD  CHA2DS2-VASc Score = 6  This indicates a 9.7% annual risk of stroke. The patient's score is based upon: CHF History: 1 HTN History: 1 Diabetes History: 1 Stroke History: 0 Vascular Disease History: 1 Age Score: 2 Gender Score: 0   CrCl 73m/min Platelet count 207K  Per office protocol, patient can hold Eliquis for 1-2 days prior to procedure.

## 2021-07-04 DIAGNOSIS — E113593 Type 2 diabetes mellitus with proliferative diabetic retinopathy without macular edema, bilateral: Secondary | ICD-10-CM | POA: Diagnosis not present

## 2021-07-04 DIAGNOSIS — H2511 Age-related nuclear cataract, right eye: Secondary | ICD-10-CM | POA: Diagnosis not present

## 2021-07-04 DIAGNOSIS — H43821 Vitreomacular adhesion, right eye: Secondary | ICD-10-CM | POA: Diagnosis not present

## 2021-07-04 DIAGNOSIS — H4312 Vitreous hemorrhage, left eye: Secondary | ICD-10-CM | POA: Diagnosis not present

## 2021-07-13 NOTE — Progress Notes (Signed)
Cardiology Office Note:   Date:  07/16/2021  NAME:  Dylan [name]    MRN: [id number] DOB:  1945/10/27   PCP:  Angelica Pou, MD  Cardiologist:  None  Electrophysiologist:  None   Referring MD: Angelica Pou, MD   Chief Complaint  Patient presents with   Shortness of Breath   History of Present Illness:   Dylan [name] is a 76 y.o. male with a hx of hypertrophic cardiomyopathy, systolic heart failure, paroxysmal atrial fibrillation/flutter, CKD stage III, nocturnal complete heart block presents for follow-up.  He reports he will have eye surgery.  Apparently he is waiting for clearance.  He does report symptoms of chest pressure.  They occur randomly.  Have been occurring for the last 2 to 3 months.  He denies any identifiable trigger.  No alleviating factor.  Symptoms can last seconds to minutes.  He also endorses cough as well as runny nose.  Symptoms possibly coincide with this.  Blood pressure severely elevated today 170/88.  He is taking Coreg as well as losartan 50 mg daily.  He reports he is gaining weight.  He actually is trying to gain weight.  He did lose weight and was having syncopal episodes related to Trulicity.  He cannot afford Eliquis.  Apparently he was denied for patient assistance.  We discussed going on Coumadin.  He is not ready to make this commitment.  He is continued on aspirin.  No evidence of lower extremity edema.  No significant shortness of breath.  He continues to deny any symptoms of dizziness or lightheadedness.  Again this all resolved with weight gain.  Also resolved with coming off Trulicity.  We again discussed his diagnosis of congestive heart failure.  Unclear if the ejection fraction on CMR was accurate.  There was significant motion artifact during the study.  Problem List  Hypertrophic cardiomyopathy/Systolic HF -EF 90% on CMR (arrhythmia artifact?) -EF 50% echo -basal septum 21 mm -3% LGE Paroxysmal Afib/flutter 3. CHB detected on  monitor -nocturnal 02/18/2021 @ 4:02 AM 4. Diabetes -A1c 8.7 5. HTN 6. CKD 3 7. Prior tobacco abuse -20 pack years  8. Pulmonary hypertension 9. OSA  Past Medical History: Past Medical History:  Diagnosis Date   Anemia 10/18/2014   Baseline about 12 and stable from 2010 to 2016. Colon 2009 in [place] (records cannot be obtained).  EGD Dr Benson Norway 2011 nl    Aortic atherosclerosis ([place]) 03/28/2020   Incidental finding on imaging CT    Atherosclerosis 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 ([place]) 12/28/2014   ABI Feb 2017 R 0.49; L 0.72 with diffuse dz ABI Aug 2017 R 0.49; L 0.69 ABI Jan 2018 R 0.61; L 0.73  ABI Feb 2019 R 0.55; L 0.71 Sees Dr Bridgett Larsson - recs ABI q 6 months   Chronic diastolic 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 ([place]) 10/18/2014   Noted ECHO 10/2014. Grade 2. EF 50-55%; echo repeated 2019, severe hypertrophy with elevated filling pressures   Chronic kidney disease, stage 3b ([place]) 03/09/2020   Dr. Justin Mend  Nephrologist, f/u Q 69M   Coronary artery disease without angina pectoris    Degloving injury of finger 03/28/2020   10/21/19: copied from op note "1.  Open reduction percutaneous pinning left small finger proximal phalanx fracture 2.  Complex repair of laceration to left small finger 3 cm in length 3.  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 No significant callus formation is noted. Pins had been removed   Diverticulosis 10/08/2014   Seen on CT. Reportedly on Colon in [place] in 2009. Freq bouts of diverticulitis.   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 Stable from 2010 to 2016.  Negative SPEP and UPEP 2014 after ARF 2/2 continued ACE (lisinopril 20) use while vol contracted. 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 Failed CPAP on study. BiPA inspiratory pressure of 21 and expiratory pressure of 17 CWP. Consider ENT evaluation for potentially correctable upper airway obstruction contributing to the need for unusually high pressures - ENT July 2015 did not feel any intervention surgically was indicated. He wore a large F&P Simplus fullfac   Personal history of colonic polyps 03/28/2014   Dr. Benson Norway, 2 polyps removed 2015.  No polyps on f/u in 2020.  No further surveillance suggested per Dr. Benson Norway.   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  He is unable to breathe through his nose comfortably.  This is part of the reason why he does not wear his oxygen regularly.  Nasal passages are nearly completely obstructed erythematous smooth glistening surface.  He has been told by his eye doctor that part of the reason his e   Resistant hypertension 10/09/2014   Poor control with 6 drug therapy. 2016 : Aldo 37 but ARR 23.5    Severe pulmonary arterial systolic hypertension ([place]) 10/18/2014   Noted as severe ECHO 2016 and 2019. Likely 2/2 severe untreated OSA. Pt is not adherent to CPAP.   Syncope 12/31/2020   Thrombocytopenia ([place]) 10/09/2016   Nl liver and spleen on [place] 2019   Ulcer aphthous oral 04/27/2020   Evaluated emergency room last week, saw with Magic mouthwash   Unintentional weight loss due to Trulicity, resolved 9/79/8921    Past Surgical History: Past Surgical History:  Procedure Laterality Date   CHOLECYSTECTOMY     COLONOSCOPY WITH PROPOFOL N/A  02/12/2019   Procedure: COLONOSCOPY WITH PROPOFOL;  Surgeon: Carol Ada, MD;  Location: [facility];  Service: Endoscopy;  Laterality: N/A;   PERCUTANEOUS PINNING Left 10/21/2019   Procedure: CLOSED REDUCTION WITH PERCUTANEOUS PINNING AND SUTURE REPAIR OF LACERATION  OF SMALL FINGER,  SUTURE REPAIR OF LACERATION OF INDEX FINGER;  Surgeon: Cindra Presume, MD;  Location: [facility];  Service: Plastics;  Laterality: Left;    Current Medications: Current Meds  Medication Sig   [DISCONTINUED] spironolactone (ALDACTONE) 25 MG tablet Take 1 tablet (25 mg total) by mouth daily.     Allergies:    Ace inhibitors and Spironolactone   Social History: Social History   Socioeconomic History   Marital status: Divorced    Spouse name: Not on file   Number of children: Not on file   Years of education: Not on file   Highest education level: Not on file  Occupational History   Not on file  Tobacco Use   Smoking status: Former    Years: 20.00    Types: Cigarettes    Quit date: 10/02/1972    Years since quitting: 48.8   Smokeless tobacco: Never  Vaping Use   Vaping Use: Never used  Substance and Sexual Activity   Alcohol use: No    Alcohol/week: 0.0 standard drinks   Drug use: No   Sexual activity: Never  Other Topics Concern   Not on file  Social History Narrative   Worked in Civil engineer, 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 Had yearly occupational testing inc PFT's. Now retired. Divorced. Has male sig other. Likes to go fishing.      He starting drinking as a teen and would drink to passing out. Couldn't keep job, have family. He quit drinking and smoking cold [place] with help of his faith. No ETOH since about age 34ish      Total of 7 brothers and 6 sisters.   Social Determinants of Health   Financial Resource Strain: Not on file  Food Insecurity: Not on file  Transportation Needs: Not on file  Physical Activity: Not on file  Stress: Not on file  Social Connections: Not on file     Family  History: The patient's family history includes CAD in his sister; Heart failure in his sister.  ROS:   All other ROS reviewed and negative. Pertinent positives noted in the HPI.     EKGs/Labs/Other Studies Reviewed:   The following studies were personally reviewed by me today:  CMR 03/15/2021 IMPRESSION: 1. Asymmetric LV hypertrophy measuring up to 32m in basal septum (143min posterior wall), consistent with hypertrophic cardiomyopathy   2. Patchy late gadolinium enhancement in basal septum and RV insertion site, consistent with HCM. LGE accounts for 3% of total myocardial mass   3. Normal LV size with severe systolic dysfunction (EF 2503% There was motion artifact likely due to arrhythmia that can affect quantification of volumes/EF   4.  Normal RV size with moderate systolic dysfunction (EF 3449%  5. Small pericardial effusion measuring up to 31m3mdjacent to LV lateral wall  TTE 01/01/2021  1. Left ventricular ejection fraction, by estimation, is 50%. The left  ventricle has mildly decreased function. The left ventricle demonstrates  global hypokinesis. There is severe concentric left ventricular  hypertrophy. Left ventricular diastolic  parameters are indeterminate. Regional strain foreshortened and suboptimal  in nature.   2. A moderate pericardial effusion is present. The pericardial effusion  is circumferential. There is no evidence of increased pericardial  pressures.   3. Right ventricular systolic function is normal. The right ventricular  size is normal.   4. Left atrial size was moderately dilated.   5. The mitral valve is normal in structure. No evidence of mitral valve  regurgitation. No evidence of mitral stenosis.   6. The aortic valve is tricuspid. There is moderate calcification of the  aortic valve. Aortic valve regurgitation is not visualized. Mild aortic  valve sclerosis is present, with no evidence of aortic valve stenosis.   Recent Labs: 12/31/2020:  B Natriuretic Peptide 855.9; Magnesium 2.0; TSH 0.677 02/14/2021: ALT 12; Hemoglobin 13.3; Platelet Count 207 03/15/2021: BUN 32; Creatinine, Ser 1.21; Potassium 3.4; Sodium 137   Recent Lipid Panel    Component Value Date/Time   CHOL 125 09/14/2020 1005   TRIG 57 09/14/2020 1005   HDL 38 (L) 09/14/2020 1005   CHOLHDL 3.3 09/14/2020 1005   CHOLHDL 4.6 01/12/2015 1032   VLDL 17 01/12/2015 1032  [place] 75 09/14/2020 1005    Physical Exam:   VS:  BP (!) 170/88    Pulse 62    Ht _0  (1.676 m)    Wt 168 lb (76.2 kg)    SpO2 96%    BMI 27.12 kg/m    Wt Readings from Last 3 Encounters:  07/16/21 168 lb (76.2 kg)  04/30/21 161 lb 6.4 oz (73.2 kg)  04/09/21 158 lb 9.6 oz (71.9 kg)    General: Well nourished, well developed, in no acute distress Head: Atraumatic, normal size  Eyes: PEERLA, EOMI  Neck: Supple, no JVD Endocrine: No thryomegaly Cardiac: Normal S1, S2; RRR; no murmurs, rubs, or gallops Lungs: Clear to auscultation bilaterally, no wheezing, rhonchi or rales  Abd: Soft, nontender, no hepatomegaly  Ext: No edema, pulses 2+ Musculoskeletal: No deformities, BUE and BLE strength normal and equal Skin: Warm and dry, no rashes   Neuro: Alert and oriented to person, place, time, and situation, CNII-XII grossly intact, no focal deficits  Psych: Normal mood and affect   ASSESSMENT:   MARCH STEYER is a 76 y.o. male who presents for the following: 1. Chest pain of uncertain etiology   2. Preoperative cardiovascular examination   3. Hypertrophic cardiomyopathy ([place])   4. Chronic systolic heart failure ([name])   5. Persistent atrial fibrillation ([place])   6. Primary hypertension   7. CHB (complete heart block) ([name])     PLAN:   1. Chest pain of uncertain etiology -Presents with possible cardiac chest pain.  Describes pressure without identifiable trigger or alleviating factors.  Symptoms occurred for months.  Cardiovascular examination is normal.  He does report cough and  congestion with this.  I suspect this could be musculoskeletal.  Regardless he will have surgery soon.  I recommended a Lexiscan nuclear medicine stress test to clarify.  He does need this for his cardiomyopathy regardless.  2. Preoperative cardiovascular examination -He will have eye surgery.  Euvolemic on exam.  Overall doing well.  We will obtain a stress test before we let him go to surgery.  3. Hypertrophic cardiomyopathy ([place]) 4. Chronic systolic heart failure ([name]) -Work-up of dizziness and lightheadedness showed cardiomyopathy with severe concentric LVH.  Cardiac MRI shows concerns for hypertrophic cardiomyopathy.  Late gadolinium enhancement 3%.  Ejection fraction on MRI was lower than echo.  There was significant artifact on the MRI so this may not be accurate. -For now we will continue with medical therapy.  He is on Coreg 25 mg twice daily.  He has an allergy to ACE inhibitors.  We will increase his losartan 100 mg daily.  Add Aldactone 25 mg daily.  Most recent kidney profile shows normal kidney function. -Cost for Eliquis was an issue.  We will hold on SGLT2 inhibitor.  I do not believe he will be able to afford this either. -No need for Lasix. -He will need a repeat echocardiogram in 3 months.   5. Persistent atrial fibrillation ([name]) -Paroxysmal atrial fibrillation captured on monitor.  No recurrence today.  Continue Coreg 25 mg twice daily. -Eliquis is too costly.  He unfortunately did not qualify for patient assistance.  Next option was Coumadin.  He reports he is not ready to commit to this at this time.  He will think about Coumadin and we will discuss this at her next visit. -He does understand not being on anticoagulation places him at considerable risk for stroke.  6. Primary hypertension -Blood pressure not well controlled.  Continue Coreg  25 mg twice daily.  Increase losartan 100 mg daily.  Add Aldactone 25 mg daily.  7. CHB (complete heart block) ([name]) -Nocturnal  complete heart block noted.  No further symptoms of dizziness or lightheadedness.  I suspect his symptoms are actually related to medications and weight loss.  We will continue to monitor him.  He has declined pacemaker.  I think this is reasonable given lack of symptoms.  Shared Decision Making/Informed Consent The risks [chest pain, shortness of breath, cardiac arrhythmias, dizziness, blood pressure fluctuations, myocardial infarction, stroke/transient ischemic attack, nausea, vomiting, allergic reaction, radiation exposure, metallic taste sensation and life-threatening complications (estimated to be 1 in 10,000)], benefits (risk stratification, diagnosing coronary artery disease, treatment guidance) and alternatives of a nuclear stress test were discussed in detail with Mr. Brickley and he agrees to proceed.  Disposition: Return in about 3 months (around 10/14/2021).  Medication Adjustments/Labs and Tests Ordered: Current medicines are reviewed at length with the patient today.  Concerns regarding medicines are outlined above.  Orders Placed This Encounter  Procedures   Cardiac Stress Test: Informed Consent Details: Physician/Practitioner Attestation; Transcribe to consent form and obtain patient signature   MYOCARDIAL PERFUSION IMAGING   Meds ordered this encounter  Medications   losartan (COZAAR) 100 MG tablet    Sig: Take 1 tablet (100 mg total) by mouth daily.    Dispense:  90 tablet    Refill:  1   DISCONTD: spironolactone (ALDACTONE) 25 MG tablet    Sig: Take 1 tablet (25 mg total) by mouth daily.    Dispense:  90 tablet    Refill:  1   spironolactone (ALDACTONE) 25 MG tablet    Sig: Take 1 tablet (25 mg total) by mouth daily.    Dispense:  90 tablet    Refill:  1    Patient Instructions  Medication Instructions:  Increase Losartan 100 mg daily  Add Aldactone 25 mg daily   *If you need a refill on your cardiac medications before your next appointment, please call your  pharmacy*   Testing/Procedures: Your physician has requested that you have a lexiscan myoview. For further information please visit [email or web address]. Please follow instruction sheet, as given.    Follow-Up: At [facility], you and your health needs are our priority.  As part of our continuing mission to provide you with exceptional heart care, we have created designated Provider Care Teams.  These Care Teams include your primary Cardiologist (physician) and Advanced Practice Providers (APPs -  Physician Assistants and Nurse Practitioners) who all work together to provide you with the care you need, when you need it.  We recommend signing up for the patient portal called "MyChart".  Sign up information is provided on this After Visit Summary.  MyChart is used to connect with patients for Virtual Visits (Telemedicine).  Patients are able to view lab/test results, encounter notes, upcoming appointments, etc.  Non-urgent messages can be sent to your provider as well.   To learn more about what you can do with MyChart, go to [email or web address].    Your next appointment:   3 month(s)  The format for your next appointment:   In Person  Provider:   Eleonore Chiquito, MD      Time Spent with Patient: I have spent a total of 35 minutes with patient reviewing hospital notes, telemetry, EKGs, labs and examining the patient as well as establishing an assessment and plan that was discussed with the patient.  > 50% of time was spent  in direct patient care.  Signed, Addison Naegeli. Audie Box, MD, [place]  [address] [place], [place] [address] [phone]  07/16/2021 2:30 PM

## 2021-07-16 ENCOUNTER — Other Ambulatory Visit: Payer: Self-pay

## 2021-07-16 ENCOUNTER — Ambulatory Visit (INDEPENDENT_AMBULATORY_CARE_PROVIDER_SITE_OTHER): Payer: Medicare HMO | Admitting: Cardiovascular Disease

## 2021-07-16 ENCOUNTER — Encounter: Payer: Self-pay | Admitting: Cardiovascular Disease

## 2021-07-16 VITALS — BP 170/88 | HR 62 | Ht 66.0 in | Wt 168.0 lb

## 2021-07-16 DIAGNOSIS — Z01810 Encounter for preprocedural cardiovascular examination: Secondary | ICD-10-CM

## 2021-07-16 DIAGNOSIS — I5022 Chronic systolic (congestive) heart failure: Secondary | ICD-10-CM | POA: Diagnosis not present

## 2021-07-16 DIAGNOSIS — I4819 Other persistent atrial fibrillation: Secondary | ICD-10-CM

## 2021-07-16 DIAGNOSIS — R079 Chest pain, unspecified: Secondary | ICD-10-CM | POA: Diagnosis not present

## 2021-07-16 DIAGNOSIS — I422 Other hypertrophic cardiomyopathy: Secondary | ICD-10-CM

## 2021-07-16 DIAGNOSIS — I442 Atrioventricular block, complete: Secondary | ICD-10-CM

## 2021-07-16 DIAGNOSIS — I10 Essential (primary) hypertension: Secondary | ICD-10-CM

## 2021-07-16 MED ORDER — SPIRONOLACTONE 25 MG PO TABS: 25.0000 mg | ORAL_TABLET | Freq: Every day | ORAL | 1 refills | Status: DC

## 2021-07-16 MED ORDER — LOSARTAN POTASSIUM 100 MG PO TABS: 100.0000 mg | ORAL_TABLET | Freq: Every day | ORAL | 1 refills | Status: DC

## 2021-07-16 NOTE — Patient Instructions (Signed)
Medication Instructions:  Increase Losartan 100 mg daily  Add Aldactone 25 mg daily   *If you need a refill on your cardiac medications before your next appointment, please call your pharmacy*   Testing/Procedures: Your physician has requested that you have a lexiscan myoview. For further information please visit [email or web address]. Please follow instruction sheet, as given.    Follow-Up: At [facility], you and your health needs are our priority.  As part of our continuing mission to provide you with exceptional heart care, we have created designated Provider Care Teams.  These Care Teams include your primary Cardiologist (physician) and Advanced Practice Providers (APPs -  Physician Assistants and Nurse Practitioners) who all work together to provide you with the care you need, when you need it.  We recommend signing up for the patient portal called "MyChart".  Sign up information is provided on this After Visit Summary.  MyChart is used to connect with patients for Virtual Visits (Telemedicine).  Patients are able to view lab/test results, encounter notes, upcoming appointments, etc.  Non-urgent messages can be sent to your provider as well.   To learn more about what you can do with MyChart, go to [email or web address].    Your next appointment:   3 month(s)  The format for your next appointment:   In Person  Provider:   Eleonore Chiquito, MD

## 2021-07-20 ENCOUNTER — Telehealth: Payer: Self-pay | Admitting: Cardiovascular Disease

## 2021-07-20 NOTE — Telephone Encounter (Signed)
Follow Up:    Patient is returning a call from today, but did not know who called.I

## 2021-07-20 NOTE — Telephone Encounter (Signed)
Called patient to see what he needed. He said he received a call from the [facility] clinic number and didn't know who had called. He stated he was to have a "test to check for blockages in his heart." I gave him the dates for the myocardial profusion and office visit for [name] and Dr. Audie Box. I informed him that the staff for the cardiac profusion should be calling him, and that could be who called him earlier.

## 2021-07-24 ENCOUNTER — Telehealth (HOSPITAL_COMMUNITY): Payer: Self-pay | Admitting: *Deleted

## 2021-07-24 NOTE — Telephone Encounter (Signed)
Close encounter

## 2021-07-25 ENCOUNTER — Ambulatory Visit (HOSPITAL_COMMUNITY)
Admission: RE | Admit: 2021-07-25 | Discharge: 2021-07-25 | Disposition: A | Discharge status: Home / Self Care | Payer: Medicare HMO | Source: Ambulatory Visit | Attending: Cardiovascular Disease | Admitting: Cardiovascular Disease

## 2021-07-25 ENCOUNTER — Other Ambulatory Visit: Payer: Self-pay

## 2021-07-25 DIAGNOSIS — R079 Chest pain, unspecified: Secondary | ICD-10-CM | POA: Insufficient documentation

## 2021-07-25 LAB — MYOCARDIAL PERFUSION IMAGING
LV dias vol: 180 mL (ref 62–150)
LV sys vol: 142 mL
Nuc Stress EF: 21 %
Peak HR: 93 {beats}/min
Rest HR: 69 {beats}/min
Rest Nuclear Isotope Dose: 10.4 mCi
SDS: 0
SRS: 0
SSS: 0
ST Depression (mm): 0 mm
Stress Nuclear Isotope Dose: 30.5 mCi

## 2021-07-25 MED ORDER — REGADENOSON 0.4 MG/5ML IV SOLN
0.4000 mg | Freq: Once | INTRAVENOUS | Status: AC
  Administered 2021-07-25: 0.4 mg via INTRAVENOUS

## 2021-07-25 MED ORDER — TECHNETIUM TC 99M TETROFOSMIN IV KIT
30.5000 | PACK | Freq: Once | INTRAVENOUS | Status: AC | PRN
  Administered 2021-07-25: 30.5 via INTRAVENOUS
  Filled 2021-07-25: qty 31

## 2021-07-25 MED ORDER — AMINOPHYLLINE 25 MG/ML IV SOLN
75.0000 mg | Freq: Once | INTRAVENOUS | Status: AC
  Administered 2021-07-25: 75 mg via INTRAVENOUS

## 2021-07-25 MED ORDER — TECHNETIUM TC 99M TETROFOSMIN IV KIT
10.4000 | PACK | Freq: Once | INTRAVENOUS | Status: AC | PRN
  Administered 2021-07-25: 10.4 via INTRAVENOUS
  Filled 2021-07-25: qty 11

## 2021-08-02 ENCOUNTER — Other Ambulatory Visit: Payer: Self-pay

## 2021-08-02 ENCOUNTER — Ambulatory Visit (INDEPENDENT_AMBULATORY_CARE_PROVIDER_SITE_OTHER): Payer: Medicare HMO | Admitting: Internal Medicine

## 2021-08-02 VITALS — BP 183/102 | HR 70 | Temp 97.5°F | Ht 66.0 in | Wt 178.1 lb

## 2021-08-02 DIAGNOSIS — E1122 Type 2 diabetes mellitus with diabetic chronic kidney disease: Secondary | ICD-10-CM | POA: Diagnosis not present

## 2021-08-02 DIAGNOSIS — N189 Chronic kidney disease, unspecified: Secondary | ICD-10-CM

## 2021-08-02 DIAGNOSIS — I129 Hypertensive chronic kidney disease with stage 1 through stage 4 chronic kidney disease, or unspecified chronic kidney disease: Secondary | ICD-10-CM | POA: Diagnosis not present

## 2021-08-02 DIAGNOSIS — I442 Atrioventricular block, complete: Secondary | ICD-10-CM | POA: Diagnosis not present

## 2021-08-02 DIAGNOSIS — E1121 Type 2 diabetes mellitus with diabetic nephropathy: Secondary | ICD-10-CM

## 2021-08-02 LAB — POCT GLYCOSYLATED HEMOGLOBIN (HGB A1C): Hemoglobin A1C: 7.5 % — AB (ref 4.0–5.6)

## 2021-08-02 LAB — GLUCOSE, CAPILLARY: Glucose-Capillary: 205 mg/dL — ABNORMAL HIGH (ref 70–99)

## 2021-08-02 NOTE — Progress Notes (Addendum)
76 year old Dylan [name] is here for routine follow-up of HTN, DM2, CKD, and other chronic conditions-my last visit with him being 03/15/2021.  In the interim, he has had the following encounters:  04/05/2021 quarterly f/u with cardiologist  Dr. Marisue Ivan.  Patient was instructed to stop amlodipine, stop metoprolol, begin Coreg 12.5 twice daily, and begin losartan 25 mg daily and follow-up in 3 months  04/09/2021 [facility] eval for tender L subareolar breast mass for which mammogram and ultrasound was ordered.  Sister deceased due to breast cancer.  Studies reportedly consistent with gynecomastia per problems list, unusual to be unilateral - I don't see that the studies have been resulted. Not drug related. Recommended that spironolactone be avoided in future if possible.  04/30/2021 [place] follow-up of HTN, Noted to be very hypertensive 203/109 with recheck 180/92, for which losartan was increased to 50 mg daily and Coreg increased to 25 mg twice daily.  Found to have elevated renin aldosterone level, spironolactone avoided due to history of gynecomastia.  Also experiencing productive cough x1 week consistent with viral URI.    07/16/2021 quarterly follow-up with cardiologist Dr. Audie Box.  BP 170/88.  Patient described episodes of chest pressure over the preceding 2 to 3 months without identifiable trigger, persisting seconds to minutes.  Noted that he could not afford Eliquis and did not qualify for patient assistance program.  Coumadin discussed, Mr. Hetzer not ready to pursue this.  Eye surgery planned, cardiology clearance requested.  Lexiscan nuclear stress test ordered and completed on 07/25/2021.Marland Kitchen  Losartan increased to 100 mg daily.  Spironolactone added at 25 mg daily.  Decision made to wait on SGLT2 inhibitor.  2D echo planned for 3 months.  Mr. Maring reports that he was feeling good until a couple of weeks ago, then belly started growing, glucose went up, BP started going up (up to 200) felt congested, started  taking nyquil, now feeling better because chest congestion has loosened. He reports that when he leans back in chair to watch tv, feels like breathing cuts off.  Currently sleeps 'all over the bed, terrible sleeper', mostly sleeps on side.  Can't lay on back, breathing cuts off.    This am became very weak during teeth brushing  - had to sit down, lasted a few moments. Did not feel as though he was going to faint (this has happened in the past year due to severe orthostatic hypotension.  He feels he is drinking adeqaute fluids,  but urine has been dark with less volume.  No black BM, but less bm size, more gas.  Meds reviewed and accurate based on recent changes by dr. Marisue Ivan.   Current Outpatient Medications:    allopurinol (ZYLOPRIM) 100 MG tablet, Take 1 tablet (100 mg total) by mouth daily., Disp: 90 tablet, Rfl: 3   losartan (COZAAR) 100 MG tablet, Take 1 tablet (100 mg total) by mouth daily., Disp: 90 tablet, Rfl: 1   metFORMIN (GLUCOPHAGE) 500 MG tablet, Take 1 tablet (500 mg total) by mouth in the morning and at bedtime., Disp: 60 tablet, Rfl: 11   spironolactone (ALDACTONE) 25 MG tablet, Take 1 tablet (25 mg total) by mouth daily., Disp: 90 tablet, Rfl: 1   atorvastatin (LIPITOR) 40 MG tablet, Take 1 tablet (40 mg total) by mouth daily., Disp: 90 tablet, Rfl: 3   carboxymethylcellulose (REFRESH PLUS) 0.5 % SOLN, Place 1 drop into both eyes 3 (three) times daily as needed (dry eyes)., Disp: , Rfl:    carvedilol (COREG) 25 MG  tablet, Take 1 tablet (25 mg total) by mouth 2 (two) times daily with a meal., Disp: 60 tablet, Rfl: 11   cycloSPORINE (RESTASIS) 0.05 % ophthalmic emulsion, Place 1 drop into both eyes 2 (two) times daily., Disp: 0.4 mL, Rfl: 1   fluticasone (FLONASE) 50 MCG/ACT nasal spray, Place 2 sprays into both nostrils daily., Disp: 11.1 mL, Rfl: 0   FREESTYLE LITE test strip, Use to check blood sugar 3 times daily, DIAG CODE E11.29, INSULIN DEPENDENT, Disp: 300 each, Rfl: 1    latanoprost (XALATAN) 0.005 % ophthalmic solution, SMARTSIG:In Eye(s), Disp: , Rfl:    polyethylene glycol (MIRALAX / GLYCOLAX) packet, Take 17 g by mouth daily as needed (constipation). , Disp: , Rfl:   Objective:  BP (!) 183/102 (BP Location: Right Arm, Cuff Size: Small)    Pulse 70    Temp (!) 97.5 F (36.4 C) (Oral)    Ht _0  (1.676 m)    Wt 178 lb 1.6 oz (80.8 kg)    SpO2 92%    BMI 28.75 kg/m   Wts 04/30/2021-161.4 LB 07/16/2021-168 LB 08/02/2021-178.1 LB  Orthostatics: Standing 172/95, 70; sitting 182/96, 70; supine 183/182, 70.  PE sitting and lying on exam table Awake and alert, normal affect, conversational and accurate in recall. Skin turgor reduced over trunk and periphery.   Fingers cool and cyanotic. Radial pulses thready. Feet warm, toes cyanotic. DP pulses thready. Feet normally sensate to monofilament testing. Heart RRR, extra heart sound over L base. S3?  Abd softly distended, tympanic to percussion, nontender, nml BS, no palpable masses. No JVD in supine position. Lungs clear to bases . No LE edema.  Assessment and plan (see also problem based documentation):  [name] is experiencing new orthopnea though volume status on exam seems reduced (decrease skin turgor, reduced radial pulses, cyanotic cool fingers) and he remains very hypertensive without orthostasis despite increase in antihypertensive medication recently.  He endorses adherence, but had not taken his medicines this morning due to being rushed (though he did take them during our visit).  On examination I hear an extra heart sound which I do not recall detecting at prior visits.  He is not hypoxic.  I will check BMP to determine how he is tolerating the addition of spironolactone and increase in losartan at recent visit.  He is not known to have a hiatal hernia as a cause of difficulty breathing when supine.  I did not appreciate pleural effusions on exam but didn't percuss chest.  T2DM remains in fair  control-A1c today 7.5. Didn't tolerate GLP1 inhibitor due to dramatic weight loss and hypotension.  He endorses recent increase I home glucose readings though hasn't changed his diet.  This coincides with a few weeks of cough and URI symptoms (self diagnosed).    HCM and HFpEF are being monitored closely by Dr. Marisue Ivan - I'll communicate with him any concerns in labs pending today.

## 2021-08-02 NOTE — Patient Instructions (Addendum)
Dylan [name], It was good to catch up with you again!  YOur blood pressure is high today though you had not yet taken your medicines.  I'm worried about your difficulty breathing when you lay down, and about your spell this morning when you became week while brushing your teeth.  Please let us know if you have further problems like this.  I'll be able to tell from today's blood tests if we need to make any changes in your medicines.  If so, I'll call you and will also communicate with Dr. Marisue Ivan.  Please take your MIralax daily to see if your belly distention improves.   Your diabetes is doing well!  Keep up the good work.  See you in 3 months !  [name]

## 2021-08-03 ENCOUNTER — Other Ambulatory Visit: Payer: Self-pay

## 2021-08-03 ENCOUNTER — Encounter (HOSPITAL_COMMUNITY): Payer: Self-pay | Admitting: Internal Medicine

## 2021-08-03 ENCOUNTER — Telehealth: Payer: Self-pay | Admitting: Internal Medicine

## 2021-08-03 ENCOUNTER — Emergency Department (HOSPITAL_COMMUNITY): Payer: Medicare HMO

## 2021-08-03 ENCOUNTER — Telehealth: Payer: Self-pay | Admitting: *Deleted

## 2021-08-03 ENCOUNTER — Encounter: Payer: Self-pay | Admitting: Internal Medicine

## 2021-08-03 ENCOUNTER — Observation Stay (HOSPITAL_COMMUNITY): Payer: Medicare HMO

## 2021-08-03 ENCOUNTER — Inpatient Hospital Stay (HOSPITAL_COMMUNITY)
Admission: EM | Admit: 2021-08-03 | Discharge: 2021-08-10 | DRG: 286 | Disposition: A | Discharge status: Home Health | Payer: Medicare HMO | Attending: Internal Medicine | Admitting: Internal Medicine

## 2021-08-03 DIAGNOSIS — I421 Obstructive hypertrophic cardiomyopathy: Secondary | ICD-10-CM

## 2021-08-03 DIAGNOSIS — I25118 Atherosclerotic heart disease of native coronary artery with other forms of angina pectoris: Secondary | ICD-10-CM | POA: Diagnosis present

## 2021-08-03 DIAGNOSIS — Z79899 Other long term (current) drug therapy: Secondary | ICD-10-CM

## 2021-08-03 DIAGNOSIS — Z7984 Long term (current) use of oral hypoglycemic drugs: Secondary | ICD-10-CM

## 2021-08-03 DIAGNOSIS — I130 Hypertensive heart and chronic kidney disease with heart failure and stage 1 through stage 4 chronic kidney disease, or unspecified chronic kidney disease: Secondary | ICD-10-CM | POA: Diagnosis not present

## 2021-08-03 DIAGNOSIS — I700 Atherosclerosis of aorta: Secondary | ICD-10-CM | POA: Diagnosis present

## 2021-08-03 DIAGNOSIS — Z8249 Family history of ischemic heart disease and other diseases of the circulatory system: Secondary | ICD-10-CM

## 2021-08-03 DIAGNOSIS — N179 Acute kidney failure, unspecified: Secondary | ICD-10-CM | POA: Diagnosis present

## 2021-08-03 DIAGNOSIS — E1122 Type 2 diabetes mellitus with diabetic chronic kidney disease: Secondary | ICD-10-CM | POA: Diagnosis present

## 2021-08-03 DIAGNOSIS — N1831 Chronic kidney disease, stage 3a: Secondary | ICD-10-CM | POA: Diagnosis present

## 2021-08-03 DIAGNOSIS — I442 Atrioventricular block, complete: Secondary | ICD-10-CM | POA: Insufficient documentation

## 2021-08-03 DIAGNOSIS — I480 Paroxysmal atrial fibrillation: Secondary | ICD-10-CM | POA: Diagnosis present

## 2021-08-03 DIAGNOSIS — I509 Heart failure, unspecified: Secondary | ICD-10-CM | POA: Diagnosis not present

## 2021-08-03 DIAGNOSIS — I272 Pulmonary hypertension, unspecified: Secondary | ICD-10-CM | POA: Diagnosis present

## 2021-08-03 DIAGNOSIS — Z86010 Personal history of colonic polyps: Secondary | ICD-10-CM

## 2021-08-03 DIAGNOSIS — I5043 Acute on chronic combined systolic (congestive) and diastolic (congestive) heart failure: Secondary | ICD-10-CM | POA: Diagnosis present

## 2021-08-03 DIAGNOSIS — N183 Chronic kidney disease, stage 3 unspecified: Secondary | ICD-10-CM | POA: Diagnosis present

## 2021-08-03 DIAGNOSIS — Z7982 Long term (current) use of aspirin: Secondary | ICD-10-CM

## 2021-08-03 DIAGNOSIS — E1121 Type 2 diabetes mellitus with diabetic nephropathy: Secondary | ICD-10-CM | POA: Diagnosis present

## 2021-08-03 DIAGNOSIS — Z888 Allergy status to other drugs, medicaments and biological substances status: Secondary | ICD-10-CM

## 2021-08-03 DIAGNOSIS — I4892 Unspecified atrial flutter: Secondary | ICD-10-CM | POA: Diagnosis present

## 2021-08-03 DIAGNOSIS — I248 Other forms of acute ischemic heart disease: Secondary | ICD-10-CM | POA: Diagnosis present

## 2021-08-03 DIAGNOSIS — Z87891 Personal history of nicotine dependence: Secondary | ICD-10-CM

## 2021-08-03 DIAGNOSIS — I3139 Other pericardial effusion (noninflammatory): Secondary | ICD-10-CM | POA: Diagnosis present

## 2021-08-03 DIAGNOSIS — M109 Gout, unspecified: Secondary | ICD-10-CM | POA: Diagnosis present

## 2021-08-03 DIAGNOSIS — E876 Hypokalemia: Secondary | ICD-10-CM | POA: Diagnosis not present

## 2021-08-03 DIAGNOSIS — E1129 Type 2 diabetes mellitus with other diabetic kidney complication: Secondary | ICD-10-CM | POA: Diagnosis present

## 2021-08-03 DIAGNOSIS — I071 Rheumatic tricuspid insufficiency: Secondary | ICD-10-CM | POA: Diagnosis present

## 2021-08-03 DIAGNOSIS — I422 Other hypertrophic cardiomyopathy: Secondary | ICD-10-CM | POA: Diagnosis present

## 2021-08-03 DIAGNOSIS — E611 Iron deficiency: Secondary | ICD-10-CM | POA: Diagnosis present

## 2021-08-03 DIAGNOSIS — Z7901 Long term (current) use of anticoagulants: Secondary | ICD-10-CM

## 2021-08-03 DIAGNOSIS — I5023 Acute on chronic systolic (congestive) heart failure: Secondary | ICD-10-CM

## 2021-08-03 DIAGNOSIS — E1165 Type 2 diabetes mellitus with hyperglycemia: Secondary | ICD-10-CM | POA: Diagnosis present

## 2021-08-03 DIAGNOSIS — E872 Acidosis, unspecified: Secondary | ICD-10-CM | POA: Diagnosis present

## 2021-08-03 DIAGNOSIS — R001 Bradycardia, unspecified: Secondary | ICD-10-CM | POA: Diagnosis not present

## 2021-08-03 DIAGNOSIS — G4733 Obstructive sleep apnea (adult) (pediatric): Secondary | ICD-10-CM | POA: Diagnosis present

## 2021-08-03 DIAGNOSIS — Z20822 Contact with and (suspected) exposure to covid-19: Secondary | ICD-10-CM | POA: Diagnosis present

## 2021-08-03 DIAGNOSIS — T45516A Underdosing of anticoagulants, initial encounter: Secondary | ICD-10-CM | POA: Diagnosis present

## 2021-08-03 DIAGNOSIS — N1832 Chronic kidney disease, stage 3b: Secondary | ICD-10-CM | POA: Diagnosis present

## 2021-08-03 DIAGNOSIS — K5791 Diverticulosis of intestine, part unspecified, without perforation or abscess with bleeding: Secondary | ICD-10-CM | POA: Diagnosis present

## 2021-08-03 DIAGNOSIS — Z91120 Patient's intentional underdosing of medication regimen due to financial hardship: Secondary | ICD-10-CM

## 2021-08-03 LAB — BMP8+ANION GAP
Anion Gap: 19 mmol/L — ABNORMAL HIGH (ref 10.0–18.0)
BUN/Creatinine Ratio: 22 (ref 10–24)
BUN: 44 mg/dL — ABNORMAL HIGH (ref 8–27)
CO2: 17 mmol/L — ABNORMAL LOW (ref 20–29)
Calcium: 9.6 mg/dL (ref 8.6–10.2)
Chloride: 106 mmol/L (ref 96–106)
Creatinine, Ser: 2.04 mg/dL — ABNORMAL HIGH (ref 0.76–1.27)
Glucose: 228 mg/dL — ABNORMAL HIGH (ref 70–99)
Potassium: 4.7 mmol/L (ref 3.5–5.2)
Sodium: 142 mmol/L (ref 134–144)
eGFR: 33 mL/min/{1.73_m2} — ABNORMAL LOW (ref >59)

## 2021-08-03 LAB — CBC WITH DIFFERENTIAL/PLATELET
Abs Immature Granulocytes: 0.02 10*3/uL (ref 0.00–0.07)
Basophils Absolute: 0.1 10*3/uL (ref 0.0–0.1)
Basophils Relative: 1 %
Eosinophils Absolute: 0.2 10*3/uL (ref 0.0–0.5)
Eosinophils Relative: 2 %
HCT: 38.2 % — ABNORMAL LOW (ref 39.0–52.0)
Hemoglobin: 12.3 g/dL — ABNORMAL LOW (ref 13.0–17.0)
Immature Granulocytes: 0 %
Lymphocytes Relative: 15 %
Lymphs Abs: 1.4 10*3/uL (ref 0.7–4.0)
MCH: 31.6 pg (ref 26.0–34.0)
MCHC: 32.2 g/dL (ref 30.0–36.0)
MCV: 98.2 fL (ref 80.0–100.0)
Monocytes Absolute: 1.3 10*3/uL — ABNORMAL HIGH (ref 0.1–1.0)
Monocytes Relative: 14 %
Neutro Abs: 6.2 10*3/uL (ref 1.7–7.7)
Neutrophils Relative %: 68 %
Platelets: 162 10*3/uL (ref 150–400)
RBC: 3.89 MIL/uL — ABNORMAL LOW (ref 4.22–5.81)
RDW: 13.5 % (ref 11.5–15.5)
WBC: 9.2 10*3/uL (ref 4.0–10.5)
nRBC: 0 % (ref 0.0–0.2)

## 2021-08-03 LAB — RESP PANEL BY RT-PCR (FLU A&B, COVID) ARPGX2
Influenza A by PCR: NEGATIVE
Influenza B by PCR: NEGATIVE
SARS Coronavirus 2 by RT PCR: NEGATIVE

## 2021-08-03 LAB — BASIC METABOLIC PANEL
Anion gap: 11 (ref 5–15)
BUN: 62 mg/dL — ABNORMAL HIGH (ref 8–23)
CO2: 17 mmol/L — ABNORMAL LOW (ref 22–32)
Calcium: 8.9 mg/dL (ref 8.9–10.3)
Chloride: 110 mmol/L (ref 98–111)
Creatinine, Ser: 2.42 mg/dL — ABNORMAL HIGH (ref 0.61–1.24)
GFR, Estimated: 27 mL/min — ABNORMAL LOW (ref >60)
Glucose, Bld: 209 mg/dL — ABNORMAL HIGH (ref 70–99)
Potassium: 4.3 mmol/L (ref 3.5–5.1)
Sodium: 138 mmol/L (ref 135–145)

## 2021-08-03 LAB — BRAIN NATRIURETIC PEPTIDE: B Natriuretic Peptide: 3479.4 pg/mL — ABNORMAL HIGH (ref 0.0–100.0)

## 2021-08-03 LAB — TROPONIN I (HIGH SENSITIVITY)
Troponin I (High Sensitivity): 91 ng/L — ABNORMAL HIGH (ref <18)
Troponin I (High Sensitivity): 95 ng/L — ABNORMAL HIGH (ref <18)

## 2021-08-03 MED ORDER — ACETAMINOPHEN 325 MG PO TABS: 650.0000 mg | ORAL_TABLET | ORAL | Status: DC | PRN

## 2021-08-03 MED ORDER — SODIUM CHLORIDE 0.9% FLUSH
3.0000 mL | Freq: Two times a day (BID) | INTRAVENOUS | Status: DC
  Administered 2021-08-04 – 2021-08-08 (×9): 3 mL via INTRAVENOUS

## 2021-08-03 MED ORDER — SODIUM CHLORIDE 0.9% FLUSH: 3.0000 mL | INTRAVENOUS | Status: DC | PRN

## 2021-08-03 MED ORDER — ENOXAPARIN SODIUM 30 MG/0.3ML IJ SOSY
30.0000 mg | PREFILLED_SYRINGE | INTRAMUSCULAR | Status: DC
  Administered 2021-08-03: 30 mg via SUBCUTANEOUS
  Filled 2021-08-03: qty 0.3

## 2021-08-03 MED ORDER — SODIUM CHLORIDE 0.9 % IV SOLN
250.0000 mL | INTRAVENOUS | Status: DC | PRN
  Administered 2021-08-06 – 2021-08-07 (×2): 250 mL via INTRAVENOUS

## 2021-08-03 MED ORDER — ONDANSETRON HCL 4 MG/2ML IJ SOLN: 4.0000 mg | Freq: Four times a day (QID) | INTRAMUSCULAR | Status: DC | PRN

## 2021-08-03 MED ORDER — FUROSEMIDE 10 MG/ML IJ SOLN
40.0000 mg | Freq: Once | INTRAMUSCULAR | Status: AC
  Administered 2021-08-03: 40 mg via INTRAVENOUS
  Filled 2021-08-03: qty 4

## 2021-08-03 MED ORDER — ALBUTEROL SULFATE HFA 108 (90 BASE) MCG/ACT IN AERS
2.0000 | INHALATION_SPRAY | RESPIRATORY_TRACT | Status: DC | PRN
  Filled 2021-08-03: qty 6.7

## 2021-08-03 NOTE — Hospital Course (Addendum)
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 BNP 3500, CXR with mild pulmonary edema, and exam with 1+ pitting edema, JVD, and abdominal distention. Trops 91 -> 95 2/2 demand. Follows Dr Farris Has. 12/2020 echo with EF 50% and 03/2021 cardiac MRI with concerns for hypertrophic cardiomyopathy and EF 25% (significant artifact present). 07/25/21 perfusion imaging with declined EF from 50% to 21% and severely reduced global function. Previously thoughts for amyloid were investigated, PYP equivocal, MRI no clear evidence. He is on Coreg 25 mg bid; Losartan increased to 100 mg qd, and Spirolactone 25 mg qd started during 1/9 cardiology appointment. SGLT2 inhibitor not started at that time 2/2 cost.  Cardiology consulted. Appropriate response to Lasix over the next several days, with improvement in weight from 173 lbs admission to 159 lbs (dry weight 160 lbs). R & L heart cath Results showing single-vessel coronary obstructive disease with 45% mid LAD stenosis in the region of the takeoff of a small second diagonal vessel which had 85% ostial/proximal stenosis. Normal left circumflex and RCA, and moderate pulm HTN. Recommending GDMT for reduced LVEF and pulmonary HTN.  On day of discharge: -Started back on Losartan during day of d/c after renal function improved.  -Continued imdur 60 mg hydralazine 50 and coreg 25 -Started Lasix 40 po qd  -Started Eliquis  -ARNI and SGLT2i are not financially feasible -Start eplerenone (will likely be unable to afford) or aldactone at post hospital follow up visit after lab work.   AKI on CKD stage 3a Baseline 1.4, up to near 2 during admission. Suspect cardiorenal. Renal US negative. Cr back to baseline with days of diuresis.  - held losartan and spirolactone during hospitalization  - Avoid nephrotoxic agents    Paroxysmal A. fib/a flutter - Continued rate control with home Coreg. - Not taking Eliquis at home due to cost - Continued Eliquis here - [facility] consult for help with Eliquis  Rectal bleeding Iron deficiency Patient reports seeing Dr. Benson Norway with GI for his rectal bleeding, thought is due to diverticular bleed. His colonoscopy in 2020 showed diverticulosis otherwise unremarkable.   Hemoglobin continues to trend down from 11.7->10.5, and then improved with no ongoing signs of blood loss. He denies any more episode of hematochezia. Held Eliquis until hemoglobin stable. Iron studies consistent with deficiency, will discuss repletion while inpatient. - Trended CBCs   HTN -Held losartan and spironolactone in setting of AKI. Continued Coreg. -Required up-titration of Hydral and Imdur to better control BP.    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  There was no evidence of monoclonal protein on the SPEP with the IFE.  Due to concern for possible cardiac amyloidosis the patient was referred to hematology for further evaluation management. UPEP to look for monoclonal protein negative. Findings thought to be consistent with CKD. - Follow up with Dr Dorsey/ hematology

## 2021-08-03 NOTE — ED Notes (Signed)
Patient transported to Ultrasound 

## 2021-08-03 NOTE — ED Provider Triage Note (Signed)
Emergency Medicine Provider Triage Evaluation Note  ABBAS [name] , a 76 y.o. male  was evaluated in triage.  Pt complains of shortness of breath that has been ongoing for a couple of weeks.  He states he was seen and evaluated here yesterday.  I do not see any records of this.  No leg swelling or chest pain.  No fever or chills.  Review of Systems  Positive:  Negative: See above   Physical Exam  BP (!) 157/98 (BP Location: Right Arm)    Pulse 71    Temp (!) 97.5 F (36.4 C) (Oral)    Resp 16    SpO2 93%  Gen:   Awake, no distress   Resp:  Normal effort  MSK:   Moves extremities without difficulty  Other:    Medical Decision Making  Medically screening exam initiated at 4:06 PM.  Appropriate orders placed.  Dylan [name] was informed that the remainder of the evaluation will be completed by another provider, this initial triage assessment does not replace that evaluation, and the importance of remaining in the ED until their evaluation is complete.     Dylan [name], [place] 08/03/21 1607

## 2021-08-03 NOTE — ED Provider Notes (Signed)
[place] EMERGENCY DEPARTMENT Provider Note   CSN: [id number] Arrival date & time: 08/03/21  1501     History  No chief complaint on file.   Dylan [name] is a 76 y.o. male history of heart failure with a EF of 25%, hypertension A. fib not compliant with Eliquis, here presenting with shortness of breath and orthopnea.  Patient has worsening shortness of breath and orthopnea for the last week or so.  Patient states that he gets very short of breath with minimal exertion now.  Patient was seen in the clinic yesterday.  Patient states that he got more short of breath today.  Patient came here for further evaluation.  Patient is only on spironolactone and not on Lasix currently.  Patient also had 10 pound weight gain  The history is provided by the patient.      Home Medications Prior to Admission medications   Medication Sig Start Date End Date Taking? Authorizing Provider  allopurinol (ZYLOPRIM) 100 MG tablet Take 1 tablet (100 mg total) by mouth daily. 12/11/20   Angelica Pou, MD  atorvastatin (LIPITOR) 40 MG tablet Take 1 tablet (40 mg total) by mouth daily. 02/21/21   Angelica Pou, MD  carboxymethylcellulose (REFRESH PLUS) 0.5 % SOLN Place 1 drop into both eyes 3 (three) times daily as needed (dry eyes).    [provider]  carvedilol (COREG) 25 MG tablet Take 1 tablet (25 mg total) by mouth 2 (two) times daily with a meal. 04/30/21   Virl Axe, MD  cycloSPORINE (RESTASIS) 0.05 % ophthalmic emulsion Place 1 drop into both eyes 2 (two) times daily. 09/14/20 09/14/21  Angelica Pou, MD  fluticasone (FLONASE) 50 MCG/ACT nasal spray Place 2 sprays into both nostrils daily. 04/30/21   Virl Axe, MD  FREESTYLE LITE test strip Use to check blood sugar 3 times daily, DIAG CODE E11.29, INSULIN DEPENDENT 06/08/21   Angelica Pou, MD  latanoprost (XALATAN) 0.005 % ophthalmic solution SMARTSIG:In Eye(s) 01/12/21   [provider]   losartan (COZAAR) 100 MG tablet Take 1 tablet (100 mg total) by mouth daily. 07/16/21 07/16/22  O'NealCassie Freer, MD  metFORMIN (GLUCOPHAGE) 500 MG tablet Take 1 tablet (500 mg total) by mouth in the morning and at bedtime. 03/15/21 03/15/22  Angelica Pou, MD  polyethylene glycol [facility] Floria Raveling) packet Take 17 g by mouth daily as needed (constipation).     [provider]  spironolactone (ALDACTONE) 25 MG tablet Take 1 tablet (25 mg total) by mouth daily. 07/16/21   O'NealCassie Freer, MD      Allergies    Ace inhibitors and Spironolactone    Review of Systems   Review of Systems  Respiratory:  Positive for shortness of breath.   Cardiovascular:  Positive for leg swelling.  All other systems reviewed and are negative.  Physical Exam Updated Vital Signs BP (!) 157/98 (BP Location: Right Arm)    Pulse 71    Temp (!) 97.5 F (36.4 C) (Oral)    Resp 16    SpO2 93%  Physical Exam Vitals and nursing note reviewed.  Constitutional:      Appearance: Normal appearance.  HENT:     Head: Normocephalic.     Nose: Nose normal.     Mouth/Throat:     Mouth: Mucous membranes are moist.  Eyes:     Extraocular Movements: Extraocular movements intact.     Pupils: Pupils are equal, round, and reactive  to light.  Cardiovascular:     Rate and Rhythm: Tachycardia present. Rhythm irregular.     Pulses: Normal pulses.  Pulmonary:     Comments: Crackles bilateral bases, tachypneic  Abdominal:     Comments: Slightly distended, nontender   Musculoskeletal:     Cervical back: Normal range of motion and neck supple.     Comments: 1 + edema bilaterally   Skin:    General: Skin is warm.     Capillary Refill: Capillary refill takes less than 2 seconds.  Neurological:     General: No focal deficit present.     Mental Status: He is alert and oriented to person, place, and time.  Psychiatric:        Mood and Affect: Mood normal.        Behavior: Behavior normal.    ED Results /  Procedures / Treatments   Labs (all labs ordered are listed, but only abnormal results are displayed) Labs Reviewed  CBC WITH DIFFERENTIAL/PLATELET - Abnormal; Notable for the following components:      Result Value   RBC 3.89 (*)    Hemoglobin 12.3 (*)    HCT 38.2 (*)    Monocytes Absolute 1.3 (*)    All other components within normal limits  BASIC METABOLIC PANEL - Abnormal; Notable for the following components:   CO2 17 (*)    Glucose, Bld 209 (*)    BUN 62 (*)    Creatinine, Ser 2.42 (*)    GFR, Estimated 27 (*)    All other components within normal limits  TROPONIN I (HIGH SENSITIVITY) - Abnormal; Notable for the following components:   Troponin I (High Sensitivity) 91 (*)    All other components within normal limits  BRAIN NATRIURETIC PEPTIDE  TROPONIN I (HIGH SENSITIVITY)    EKG EKG Interpretation  Date/Time:  Friday August 03 2021 15:19:25 EST Ventricular Rate:  71 PR Interval:    QRS Duration: 152 QT Interval:  492 QTC Calculation: 534 R Axis:   -25 Text Interpretation: Atrial flutter with variable A-V block with premature ventricular or aberrantly conducted complexes Right bundle branch block T wave abnormality, consider lateral ischemia Abnormal ECG When compared with ECG of 11-Jan-2021 17:03, aflutter new since previous Confirmed by Wandra Arthurs [phone]) on 08/03/2021 6:43:17 PM  Radiology DG Chest 2 View  Result Date: 08/03/2021 CLINICAL DATA:  Shortness of breath EXAM: CHEST - 2 VIEW COMPARISON:  Chest x-ray 12/31/2020 FINDINGS: Cardiomegaly and mediastinum appear unchanged. Calcified plaques in the aortic arch. Mild pulmonary vascular congestion and low lung volumes. Bibasilar likely subsegmental atelectasis. No pleural effusion or pneumothorax visualized. IMPRESSION: 1. Cardiomegaly and mild pulmonary vascular congestion. 2. Low lung volumes with bibasilar likely subsegmental atelectasis. Electronically Signed   By: Ofilia Neas M.D.   On: 08/03/2021 16:42     Procedures Procedures    Medications Ordered in ED Medications  albuterol (VENTOLIN HFA) 108 (90 Base) MCG/ACT inhaler 2 puff (has no administration in time range)  furosemide (LASIX) injection 40 mg (has no administration in time range)    ED Course/ Medical Decision Making/ A&P                           Medical Decision Making Dylan [name] is a 76 y.o. male history of heart failure and A. fib but not compliant with Eliquis here presenting with shortness of breath.  Patient clinically appears volume overloaded.  I am concerned  for possible CHF exacerbation.  Low suspicion for PE.  Patient is noncompliant with with Eliquis due to cost reasons.  Patient is back in a flutter but has a history of atrial flutter.  At this point plan to get CBC and CMP and BNP and troponin and chest x-ray  7:17 PM' Patient's troponin is 91 likely from demand ischemia from CHF exacerbation.  Patient also has acute renal failure with creatinine is 2.4.  I think the renal failure is likely secondary to heart failure.  Patient given Lasix 40 mg IV.  At this point, internal medicine to admit for heart failure exacerbation and AKI.     Problems Addressed: Acute on chronic systolic congestive heart failure [facility]): acute illness or injury AKI (acute kidney injury) ([place]): acute illness or injury  Amount and/or Complexity of Data Reviewed External Data Reviewed: notes. Labs: ordered. Radiology: ordered. ECG/medicine tests: ordered and independent interpretation performed. Decision-making details documented in ED Course.  Risk Prescription drug management. Decision regarding hospitalization.  Final Clinical Impression(s) / ED Diagnoses Final diagnoses:  None    Rx / DC Orders ED Discharge Orders     None         Drenda Freeze, MD 08/03/21 [phone]

## 2021-08-03 NOTE — Telephone Encounter (Signed)
Patient called in stating when he reclines back on sofa, "it feels like I cut my breath off." Does not feel he needs his oxygen when sitting, only when he lies back or walks around. States BP this AM 190/? ("Didn't notice."). CBG 242. States he's had a "cough and congestion" x 3 months. Denies cough today. Patient reported these same sx to PCP at yesterday's OV. Explained PCP will be calling him to review lab results and he can discuss this with her at that time.

## 2021-08-03 NOTE — Telephone Encounter (Signed)
Returned call to Dylan [name] who had  phoned clinic this am concerned about ongoing difficulty breathing when he lied down.  VM box not set up.    Change in therapy and further medical attention needed based on worsening of renal function.  I"ll continue to try to reach him.

## 2021-08-03 NOTE — ED Triage Notes (Signed)
Pt here POV with c/o of increased SOB. Pt states he can not walk but a few steps. Cough X2 months.

## 2021-08-03 NOTE — Progress Notes (Signed)
°   08/03/21 2215  Vitals  Temp 98.5 F (36.9 C)  Temp Source Oral  BP (!) 145/91  MAP (mmHg) 107  Pulse Rate 72  ECG Heart Rate 72  Resp 19  Level of Consciousness  Level of Consciousness Alert  MEWS COLOR  MEWS Score Color Green  Oxygen Therapy  SpO2 99 %  O2 Device Room Air  Height and Weight  Height _0  (1.676 m)  Weight 78.9 kg  Type of Scale Used Bed  Type of Weight Actual  BSA (Calculated - sq m) 1.92 sq meters  BMI (Calculated) 28.09  Weight in (lb) to have BMI = 25 154.6  MEWS Score  MEWS Temp 0  MEWS Systolic 0  MEWS Pulse 0  MEWS RR 0  MEWS LOC 0  MEWS Score 0   Admitted pt to rm 3E14 from ED, pt is alert and oriented x 4, oriented to room, placed on cardiac monitor, CCMD made aware. Call bell within reach.

## 2021-08-03 NOTE — Assessment & Plan Note (Signed)
Orthostatics: Standing 172/95, 70; sitting 182/96, 70; supine 183/182, 70. Did not take meds this am.  Regimen adjusted by Dr. Marisue Ivan recently: spironolactone added 25 mg daily, losartan increased from 50 to 100 mg daily.

## 2021-08-03 NOTE — H&P (Addendum)
Date: 08/04/2021               Patient Name:  Dylan [name] MRN: [id number]  DOB: 1945-11-08 Age / Sex: 76 y.o., male   PCP: Angelica Pou, MD         Medical Service: Internal Medicine Teaching Service         Attending Physician: Dr. Dareen Piano          First Contact: Tamsen Snider, MD Pager: Cherly Hensen [phone]       After Hours (After 5p/  First Contact Pager: [phone]  weekends / holidays): Second Contact Pager: [phone]    Chief Complaint: [facility]  History of Present Illness:  Dylan [name] is a 76 y.o. male with a pertinent PMH hypertrophic cardiomyopathy, HFrEF, paroxysmal atrial fibrillation/flutter non-compliant with Eliquis, CKD stage III, HTN, and DM2 presenting to the ED with chief complaint of [place].   Patient reports progressive SHOB, DOE, and orthopnea x 1 week. He also endorses weakness and "feeling confused" during this time period. He denies recent trauma or falls. He reports good medication compliance with the exception of Eliquis, which he has stopped for about 1 month now due to cost. He also reports drinking "lots of water", about 7 "tall glasses" and complains of not producing much urine. He also reports a recent 10 lb weight gain. He denies fevers, chills, N/V/D, chest pain, abdominal pain, and changes in stool.   He was seen by cardiology/Dr Farris Has on 1/9 with complaints of chest pain. Found to be hypertensive 170/88, and Losartan increased to 100 mg daily and Spironolactone added at 25 mg daily. Unfortunately did not qualify for patient assistance program for Eliquis and did not wish to start Coumadin at that time. Nuclear stress test showed no evidence of blockage.   He was seen in the clinic 1/26 for similar complaints of abdominal distension, orthopnea, and SHOB. BMP was collected, which later showed an AKI. Due to ongoing symptoms and new AKI, he was advised to go to the ED.  Initial ED work up showing increased Cr to 2.4 (baseline 1.4), troponin 91, and unremarkable  CBC. BNP pending. CXR showing mild pulmonary vascular congestion. He received 40 IV lasix.   Meds:  No current facility-administered medications on file prior to encounter.   Current Outpatient Medications on File Prior to Encounter  Medication Sig Dispense Refill   allopurinol (ZYLOPRIM) 100 MG tablet Take 1 tablet (100 mg total) by mouth daily. 90 tablet 3   aspirin EC 81 MG tablet Take 81 mg by mouth daily. Swallow whole.     atorvastatin (LIPITOR) 40 MG tablet Take 1 tablet (40 mg total) by mouth daily. 90 tablet 3   carboxymethylcellulose (REFRESH PLUS) 0.5 % SOLN Place 1 drop into both eyes 3 (three) times daily as needed (dry eyes).     carvedilol (COREG) 25 MG tablet Take 1 tablet (25 mg total) by mouth 2 (two) times daily with a meal. 60 tablet 11   cycloSPORINE (RESTASIS) 0.05 % ophthalmic emulsion Place 1 drop into both eyes 2 (two) times daily. 0.4 mL 1   fluticasone (FLONASE) 50 MCG/ACT nasal spray Place 2 sprays into both nostrils daily. 11.1 mL 0   latanoprost (XALATAN) 0.005 % ophthalmic solution SMARTSIG:In Eye(s)     loratadine (CLARITIN) 10 MG tablet Take 10 mg by mouth daily.     losartan (COZAAR) 100 MG tablet Take 1 tablet (100 mg total) by mouth daily. 90 tablet 1   metFORMIN (  GLUCOPHAGE) 500 MG tablet Take 1 tablet (500 mg total) by mouth in the morning and at bedtime. 60 tablet 11   polyethylene glycol (MIRALAX / GLYCOLAX) packet Take 17 g by mouth daily as needed (constipation).      spironolactone (ALDACTONE) 25 MG tablet Take 1 tablet (25 mg total) by mouth daily. 90 tablet 1   FREESTYLE LITE test strip Use to check blood sugar 3 times daily, DIAG CODE E11.29, INSULIN DEPENDENT 300 each 1    Allergies: Allergies as of 08/03/2021 - Review Complete 08/03/2021  Allergen Reaction Noted   Ace inhibitors Other (See Comments) 10/03/2017   Spironolactone Other (See Comments) 10/19/2014   Past Medical History:  Diagnosis Date   Anemia 10/18/2014   Baseline about 12  and stable from 2010 to 2016. Colon 2009 in [place] (records cannot be obtained).  EGD Dr Benson Norway 2011 nl    Aortic atherosclerosis ([place]) 03/28/2020   Incidental finding on imaging CT    Atherosclerosis 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 ([place]) 12/28/2014   ABI Feb 2017 R 0.49; L 0.72 with diffuse dz ABI Aug 2017 R 0.49; L 0.69 ABI Jan 2018 R 0.61; L 0.73  ABI Feb 2019 R 0.55; L 0.71 Sees Dr Bridgett Larsson - recs ABI q 6 months   Chronic diastolic 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 ([place]) 10/18/2014   Noted ECHO 10/2014. Grade 2. EF 50-55%; echo repeated 2019, severe hypertrophy with elevated filling pressures   Chronic kidney disease, stage 3b ([place]) 03/09/2020   Dr. Justin Mend  Nephrologist, f/u Q 70M   Coronary artery disease without angina pectoris    Degloving injury of finger 03/28/2020   10/21/19: copied from op note "1.  Open reduction percutaneous pinning left small finger proximal phalanx fracture 2.  Complex repair of laceration to left small finger 3 cm in length 3.  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 No significant callus formation is noted. Pins had been removed   Diverticulosis 10/08/2014   Seen on CT. Reportedly on Colon in [place] in 2009. Freq bouts of diverticulitis.   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 Stable from 2010 to 2016.  Negative SPEP and UPEP 2014 after ARF 2/2 continued ACE (lisinopril 20) use while vol contracted. 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 Failed CPAP on study. BiPA inspiratory pressure of 21 and expiratory pressure of 17 CWP. Consider ENT evaluation for potentially correctable upper airway obstruction contributing to the need for unusually high pressures - ENT July 2015 did not feel any intervention surgically was indicated. He wore a large F&P Simplus fullfac   Personal history of colonic polyps 03/28/2014   Dr. Benson Norway, 2 polyps removed 2015.  No polyps on f/u in 2020.  No further surveillance suggested per Dr. Benson Norway.   Refractory obstruction of nasal airway 04/27/2020   Chronic problem, evaluated  by ENT in the past, with recommendation for surgery which she has been hesitant to consider.  He is unable to breathe through his nose comfortably.  This is part of the reason why he does not wear his oxygen regularly.  Nasal passages are nearly completely obstructed erythematous smooth glistening surface.  He has been told by his eye doctor that part of the reason his e   Resistant hypertension 10/09/2014   Poor control with 6 drug therapy. 2016 : Aldo 37 but ARR 23.5    Severe pulmonary arterial systolic hypertension ([place]) 10/18/2014   Noted as severe ECHO 2016 and 2019. Likely 2/2 severe untreated OSA. Pt is not adherent to CPAP.   Syncope 12/31/2020   Thrombocytopenia ([place]) 10/09/2016   Nl liver and spleen on [place] 2019   Ulcer aphthous oral 04/27/2020   Evaluated emergency room last week, saw with Magic mouthwash   Unintentional weight loss due to Trulicity, resolved 0/35/4656   Family History  Problem Relation Age of Onset   Heart failure Sister        Died of complications Nov 8127   CAD Sister        CABG x3 while in her 26's    Social History:   Lives alone in [place]  Former smoker having quit 30 years ago. Denies tobacco, alcohol and drug use. IADLs/ADLs- can person  independently at baseline   Review of Systems: A complete ROS was negative except as per HPI.    Physical Exam: Blood pressure (!) 145/91, pulse 72, temperature 97.8 F (36.6 C), temperature source Oral, resp. rate 19, height _0  (1.676 m), weight 78.9 kg, SpO2 99 %.  Constitutional: alert, well-appearing, in NAD Neck: JVD present  Cardiovascular: irregular rhythm, regular rate, 1+ bilateral LE Pulmonary/Chest: normal work of breathing on room air, crackles in bilateral lower bases Abdominal: soft, distended and mild TTP MSK: normal bulk and tone Neurological: A&O x 3, follows commands  Skin: warm and dry  EKG: Atrial flutter   CXR: mild pulmonary vascular congestion  Assessment & Plan by Problem: Principal Problem:   Acute exacerbation of CHF (congestive heart failure) ([name])   Dylan [name] is a 76 y.o. male with a pertinent PMH of hypertrophic cardiomyopathy, systolic heart failure (LVEF 50% on 12/2020), paroxysmal atrial fibrillation/flutter non-compliant with Eliquis, CKD stage III, HTN, and DM2 admitted for acute on chronic HFmrEF exacerbation.   AoC HFmrEF exacerbation  Hypertrophic cardiomyopathy  Elevated Troponin  Presenting progressive SHOB, DOE, orthopnea and weight gain x 1 week in the setting of dietary indiscretion. BNP 3500, CXR with mild pulmonary edema, and exam with 1+ pitting edema, JVD, and abdominal distention. Trops 91 -> 95, likely from demand. Pt reports some improvement in symptoms s/p 40 IV Lasix in the ED. Follows Dr Farris Has. 12/2020 echo with EF 50% and 03/2021 cardiac MRI with concerns for hypertrophic cardiomyopathy and EF 25% (significant artifact present). 07/25/21 perfusion imaging with declined EF from 50% to 21% and severely reduced global function. He is on Coreg 25 mg bid; Losartan increased to 100 mg qd, and Spirolactone 25 mg qd started during 1/9 cardiology appointment. SGLT2 inhibitor not started at that time 2/2 cost.  - Lasix 40 IV, and reassess  fluid status in the morning  - Echo pending  - Strict I/O's and daily weights  - Fluid restriction  - BMP   AKI on CKD stage 3a Increasing Cr 2 -> 2.4 in 2 days (baseline 1.4); likely multifactorial in setting of  HF exacerbation and recent increase in ARB dose and starting spiro on 1/9. - Renal US  - Hold losartan - Hold spirolactone  - Avoid nephrotoxic agents  Paroxysmal Afib/flutter EKG with atrial flutter. Rate controlled in the 70's. Non-compliant with Eliquis 2/2 cost x 1 mo, and does not qualify for assistance. Coumadin offered, but he is not ready to start at this time. Risk for strokes was discussed with him at [place], and he understands.  - Continue Coreg   HTN BP elevated. He is on Coreg 25, losartan 100 and spirolactone 25.  - Resume Coreg - Holding losartan and spirolactone in setting of AKI  T2DM Last A1c 7.5 (1/26) - SSI  - CBG monitoring   Elevated Serum Free Light Chain Ratio Concern for Cardiac Amyloidosis He had serum free light chains ordered showing a kappa/lambda ratio of 2.3.  There was no evidence of monoclonal protein on the SPEP with the IFE.  Due to concern for possible cardiac amyloidosis the patient was referred to hematology for further evaluation management. UPEP to look for monoclonal protein negative. Findings thought to be consistent with CKD. - Follow up with Dr Dorsey/ hematology    Best Practice: Diet: Heart Healthy IVF: None,None VTE: Enoxaparin Code: Full   [name], MD  Internal Medicine Resident, PGY-1 [facility]  Pager: [phone] 12:18 AM, 08/04/2021

## 2021-08-03 NOTE — Assessment & Plan Note (Signed)
A1c 7.5 on metformin 500 mg p.o. twice daily tolerated well.  Fair control though no changes in regimen today as he has other more concerning symptoms.

## 2021-08-04 ENCOUNTER — Observation Stay (HOSPITAL_COMMUNITY): Payer: Medicare HMO

## 2021-08-04 DIAGNOSIS — I071 Rheumatic tricuspid insufficiency: Secondary | ICD-10-CM | POA: Diagnosis present

## 2021-08-04 DIAGNOSIS — I509 Heart failure, unspecified: Secondary | ICD-10-CM | POA: Diagnosis present

## 2021-08-04 DIAGNOSIS — E611 Iron deficiency: Secondary | ICD-10-CM | POA: Diagnosis present

## 2021-08-04 DIAGNOSIS — I130 Hypertensive heart and chronic kidney disease with heart failure and stage 1 through stage 4 chronic kidney disease, or unspecified chronic kidney disease: Secondary | ICD-10-CM | POA: Diagnosis present

## 2021-08-04 DIAGNOSIS — I480 Paroxysmal atrial fibrillation: Secondary | ICD-10-CM | POA: Diagnosis present

## 2021-08-04 DIAGNOSIS — G4733 Obstructive sleep apnea (adult) (pediatric): Secondary | ICD-10-CM | POA: Diagnosis present

## 2021-08-04 DIAGNOSIS — I4892 Unspecified atrial flutter: Secondary | ICD-10-CM | POA: Diagnosis present

## 2021-08-04 DIAGNOSIS — I3139 Other pericardial effusion (noninflammatory): Secondary | ICD-10-CM | POA: Diagnosis present

## 2021-08-04 DIAGNOSIS — I700 Atherosclerosis of aorta: Secondary | ICD-10-CM | POA: Diagnosis present

## 2021-08-04 DIAGNOSIS — E872 Acidosis, unspecified: Secondary | ICD-10-CM | POA: Diagnosis present

## 2021-08-04 DIAGNOSIS — E876 Hypokalemia: Secondary | ICD-10-CM | POA: Diagnosis not present

## 2021-08-04 DIAGNOSIS — I50813 Acute on chronic right heart failure: Secondary | ICD-10-CM | POA: Diagnosis not present

## 2021-08-04 DIAGNOSIS — N179 Acute kidney failure, unspecified: Secondary | ICD-10-CM | POA: Diagnosis present

## 2021-08-04 DIAGNOSIS — M109 Gout, unspecified: Secondary | ICD-10-CM | POA: Diagnosis present

## 2021-08-04 DIAGNOSIS — N1831 Chronic kidney disease, stage 3a: Secondary | ICD-10-CM

## 2021-08-04 DIAGNOSIS — E1122 Type 2 diabetes mellitus with diabetic chronic kidney disease: Secondary | ICD-10-CM

## 2021-08-04 DIAGNOSIS — I10 Essential (primary) hypertension: Secondary | ICD-10-CM | POA: Diagnosis not present

## 2021-08-04 DIAGNOSIS — I420 Dilated cardiomyopathy: Secondary | ICD-10-CM | POA: Diagnosis not present

## 2021-08-04 DIAGNOSIS — E1165 Type 2 diabetes mellitus with hyperglycemia: Secondary | ICD-10-CM | POA: Diagnosis present

## 2021-08-04 DIAGNOSIS — R001 Bradycardia, unspecified: Secondary | ICD-10-CM | POA: Diagnosis not present

## 2021-08-04 DIAGNOSIS — I5023 Acute on chronic systolic (congestive) heart failure: Secondary | ICD-10-CM | POA: Diagnosis not present

## 2021-08-04 DIAGNOSIS — I422 Other hypertrophic cardiomyopathy: Secondary | ICD-10-CM | POA: Diagnosis present

## 2021-08-04 DIAGNOSIS — K5791 Diverticulosis of intestine, part unspecified, without perforation or abscess with bleeding: Secondary | ICD-10-CM | POA: Diagnosis present

## 2021-08-04 DIAGNOSIS — I2510 Atherosclerotic heart disease of native coronary artery without angina pectoris: Secondary | ICD-10-CM | POA: Diagnosis not present

## 2021-08-04 DIAGNOSIS — I421 Obstructive hypertrophic cardiomyopathy: Secondary | ICD-10-CM | POA: Diagnosis present

## 2021-08-04 DIAGNOSIS — I272 Pulmonary hypertension, unspecified: Secondary | ICD-10-CM | POA: Diagnosis present

## 2021-08-04 DIAGNOSIS — Z20822 Contact with and (suspected) exposure to covid-19: Secondary | ICD-10-CM | POA: Diagnosis present

## 2021-08-04 DIAGNOSIS — I248 Other forms of acute ischemic heart disease: Secondary | ICD-10-CM | POA: Diagnosis present

## 2021-08-04 DIAGNOSIS — I442 Atrioventricular block, complete: Secondary | ICD-10-CM | POA: Diagnosis present

## 2021-08-04 DIAGNOSIS — I5043 Acute on chronic combined systolic (congestive) and diastolic (congestive) heart failure: Secondary | ICD-10-CM | POA: Diagnosis present

## 2021-08-04 DIAGNOSIS — I25118 Atherosclerotic heart disease of native coronary artery with other forms of angina pectoris: Secondary | ICD-10-CM | POA: Diagnosis present

## 2021-08-04 LAB — BASIC METABOLIC PANEL
Anion gap: 12 (ref 5–15)
BUN: 59 mg/dL — ABNORMAL HIGH (ref 8–23)
CO2: 19 mmol/L — ABNORMAL LOW (ref 22–32)
Calcium: 9.1 mg/dL (ref 8.9–10.3)
Chloride: 109 mmol/L (ref 98–111)
Creatinine, Ser: 2.31 mg/dL — ABNORMAL HIGH (ref 0.61–1.24)
GFR, Estimated: 28 mL/min — ABNORMAL LOW (ref >60)
Glucose, Bld: 197 mg/dL — ABNORMAL HIGH (ref 70–99)
Potassium: 3.7 mmol/L (ref 3.5–5.1)
Sodium: 140 mmol/L (ref 135–145)

## 2021-08-04 MED ORDER — APIXABAN 5 MG PO TABS
5.0000 mg | ORAL_TABLET | Freq: Two times a day (BID) | ORAL | Status: DC
  Administered 2021-08-04 (×2): 5 mg via ORAL
  Filled 2021-08-04 (×2): qty 1

## 2021-08-04 MED ORDER — FUROSEMIDE 10 MG/ML IJ SOLN
40.0000 mg | Freq: Two times a day (BID) | INTRAMUSCULAR | Status: DC
  Administered 2021-08-04 – 2021-08-06 (×5): 40 mg via INTRAVENOUS
  Filled 2021-08-04 (×5): qty 4

## 2021-08-04 MED ORDER — ALLOPURINOL 100 MG PO TABS
100.0000 mg | ORAL_TABLET | Freq: Every day | ORAL | Status: DC
  Administered 2021-08-04 – 2021-08-10 (×6): 100 mg via ORAL
  Filled 2021-08-04 (×6): qty 1

## 2021-08-04 MED ORDER — FUROSEMIDE 10 MG/ML IJ SOLN
40.0000 mg | Freq: Once | INTRAMUSCULAR | Status: AC
  Administered 2021-08-04: 40 mg via INTRAVENOUS
  Filled 2021-08-04: qty 4

## 2021-08-04 MED ORDER — ATORVASTATIN CALCIUM 40 MG PO TABS
40.0000 mg | ORAL_TABLET | Freq: Every day | ORAL | Status: DC
  Administered 2021-08-04 – 2021-08-10 (×6): 40 mg via ORAL
  Filled 2021-08-04 (×6): qty 1

## 2021-08-04 MED ORDER — CYCLOSPORINE 0.05 % OP EMUL
1.0000 [drp] | Freq: Two times a day (BID) | OPHTHALMIC | Status: DC
  Administered 2021-08-04 – 2021-08-10 (×12): 1 [drp] via OPHTHALMIC
  Filled 2021-08-04 (×15): qty 30

## 2021-08-04 MED ORDER — ASPIRIN EC 81 MG PO TBEC
81.0000 mg | DELAYED_RELEASE_TABLET | Freq: Every day | ORAL | Status: DC
  Administered 2021-08-04 – 2021-08-10 (×6): 81 mg via ORAL
  Filled 2021-08-04 (×6): qty 1

## 2021-08-04 MED ORDER — CARVEDILOL 25 MG PO TABS
25.0000 mg | ORAL_TABLET | Freq: Two times a day (BID) | ORAL | Status: DC
  Administered 2021-08-04 – 2021-08-10 (×13): 25 mg via ORAL
  Filled 2021-08-04 (×13): qty 1

## 2021-08-04 NOTE — Evaluation (Signed)
Physical Therapy Evaluation Patient Details Name: Dylan [name] MRN: [id number] DOB: 1946-01-19 Today's Date: 08/04/2021  History of Present Illness  Pt is a 76 year old man admitted on 08/03/21 with shortness of breath due to CHF exacerbation. Pt also + for AKI. PMH: hypertrophic cardiomyopathy, PAF, CHF with EF of 25%, PAF-non compliant with Eliquis, CKD3, HTN, DM2.   Clinical Impression  Pt in bed upon arrival of PT, agreeable to evaluation at this time. Prior to admission the pt was mobilizing with use of standard walker, and reports no falls in last 4 months while living at home alone. The pt now presents with limitations in functional mobility, power, endurance, and dynamic stability due to above dx, and will continue to benefit from skilled PT to address these deficits. The pt was able to complete sit-stand transfers and short bouts of marching without UE support, but required BUE support for hallway ambulation at this time. He did complete hallway ambulation initially on RA, but RR increased to 39 and questionable drop in SpO2 to 86%, the pt was then given 2L O2 and SpO2 improved to 90s with standing rest. Will benefit from continued monitoring of SPO2 with activity. Given the pt was managing well as home prior to admission and has family support, recommend return home with home therapies to facilitate improved endurance and stability after d/c.   Gait Speed: 0.57ms using RW and with 2L O2. (Gait speed <0.684m indicates increased risk of falls and dependence in ADLs)  5X Sit-to-Stand: 20.1 sec (> 12.6 sec indicates increased risk of falls for individuals aged 78-79> 15 sec indicates increased risk of recurrent falls)      Recommendations for follow up therapy are one component of a multi-disciplinary discharge planning process, led by the attending physician.  Recommendations may be updated based on patient status, additional functional criteria and insurance authorization.  Follow Up  Recommendations Home health PT    Assistance Recommended at Discharge Intermittent Supervision/Assistance  Patient can return home with the following  Assistance with cooking/housework;Assist for transportation;Help with stairs or ramp for entrance    Equipment Recommendations Rolling walker (2 wheels)  Recommendations for Other Services       Functional Status Assessment Patient has had a recent decline in their functional status and demonstrates the ability to make significant improvements in function in a reasonable and predictable amount of time.     Precautions / Restrictions Precautions Precautions: Fall Precaution Comments: denies any falls x 4 months Restrictions Weight Bearing Restrictions: No      Mobility  Bed Mobility Overal bed mobility: Modified Independent             General bed mobility comments: HOB up    Transfers Overall transfer level: Needs assistance Equipment used: Rolling walker (2 wheels) Transfers: Sit to/from Stand Sit to Stand: Min guard                Ambulation/Gait Ambulation/Gait assistance: Min guard Gait Distance (Feet): 75 Feet (+ 85 ft after standing rest break) Assistive device: Rolling walker (2 wheels) Gait Pattern/deviations: Step-through pattern, Decreased stride length, Shuffle, Trunk flexed Gait velocity: 0.36 m/s Gait velocity interpretation: <1.31 ft/sec, indicative of household ambulator Pre-gait activities: marching at EOB, x10 without UE support General Gait Details: pt with heavy reliance on BUE support on RW. questionable SpO2 reading of 86% with gait and 2L O2 applied with pt SpO2 reading in 90s after standing rest. No overt LOB but minG for safety and max cues  for posture      Balance Overall balance assessment: Needs assistance   Sitting balance-Leahy Scale: Good     Standing balance support: Bilateral upper extremity supported Standing balance-Leahy Scale: Poor Standing balance comment: reliant  on RW for ambulation, fair static balance                             Pertinent Vitals/Pain Pain Assessment Faces Pain Scale: No hurt    Home Living Family/patient expects to be discharged to:: Private residence Living Arrangements: Alone Available Help at Discharge: Family;Available PRN/intermittently (granddaughter) Type of Home: Apartment Home Access: Stairs to enter Entrance Stairs-Rails: Right Entrance Stairs-Number of Steps: 8   Home Layout: One level Home Equipment: Administrator, sports - quad Additional Comments: granddaughter helps with groceries and transportation to doctor    Prior Function Prior Level of Function : Independent/Modified Independent             Mobility Comments: recently started using standard walker ADLs Comments: sits to shower, independent in self care, meal prep, light houskeeping     Hand Dominance   Dominant Hand: Right    Extremity/Trunk Assessment   Upper Extremity Assessment Upper Extremity Assessment: Defer to OT evaluation    Lower Extremity Assessment Lower Extremity Assessment: Generalized weakness (able to use functionally for sit-stand transfers, but poor functional ROM and power)    Cervical / Trunk Assessment Cervical / Trunk Assessment: Kyphotic  Communication   Communication: HOH  Cognition Arousal/Alertness: Awake/alert Behavior During Therapy: WFL for tasks assessed/performed Overall Cognitive Status: Within Functional Limits for tasks assessed                                          General Comments General comments (skin integrity, edema, etc.): questionable SpO2 reading of 86% with gait and 2L O2 applied with pt SpO2 reading in 90s after standing rest.    Exercises Other Exercises Other Exercises: 5x sit-stand - 20.1 sec   Assessment/Plan    PT Assessment Patient needs continued PT services  PT Problem List Decreased strength;Decreased activity  tolerance;Decreased balance;Decreased mobility;Decreased knowledge of use of DME;Decreased safety awareness;Cardiopulmonary status limiting activity       PT Treatment Interventions DME instruction;Gait training;Stair training;Functional mobility training;Therapeutic activities;Therapeutic exercise;Balance training;Patient/family education    PT Goals (Current goals can be found in the Care Plan section)  Acute Rehab PT Goals Patient Stated Goal: return home and to full independence PT Goal Formulation: With patient Time For Goal Achievement: 08/18/21 Potential to Achieve Goals: Good    Frequency Min 3X/week        AM-PAC PT "6 Clicks" Mobility  Outcome Measure Help needed turning from your back to your side while in a flat bed without using bedrails?: A Little Help needed moving from lying on your back to sitting on the side of a flat bed without using bedrails?: A Little Help needed moving to and from a bed to a chair (including a wheelchair)?: A Little Help needed standing up from a chair using your arms (e.g., wheelchair or bedside chair)?: A Little Help needed to walk in hospital room?: A Little Help needed climbing 3-5 steps with a railing? : A Little 6 Click Score: 18    End of Session Equipment Utilized During Treatment: Gait belt;Oxygen Activity Tolerance: Patient tolerated treatment well Patient left: in chair;with  call bell/phone within reach;with chair alarm set;with nursing/sitter in room Nurse Communication: Mobility status PT Visit Diagnosis: Unsteadiness on feet (R26.81);Other abnormalities of gait and mobility (R26.89);Muscle weakness (generalized) (M62.81)    Time: 6825-7493 PT Time Calculation (min) (ACUTE ONLY): 20 min   Charges:   PT Evaluation $PT Eval Moderate Complexity: 1 Mod          West Carbo, PT, DPT   Acute Rehabilitation Department Pager #: [phone]  Sandra Cockayne 08/04/2021, 11:11 AM

## 2021-08-04 NOTE — Progress Notes (Signed)
°   08/04/21 0808  Assess: MEWS Score  Temp 98.1 F (36.7 C)  BP (!) 177/95  Pulse Rate 73  ECG Heart Rate 78  Resp (!) 33  SpO2 92 %  Assess: MEWS Score  MEWS Temp 0  MEWS Systolic 0  MEWS Pulse 0  MEWS RR 2  MEWS LOC 0  MEWS Score 2  MEWS Score Color Yellow  Assess: if the MEWS score is Yellow or Red  Were vital signs taken at a resting state? Yes  Focused Assessment No change from prior assessment  Early Detection of Sepsis Score *See Row Information* Low  MEWS guidelines implemented *See Row Information* No, previously yellow, continue vital signs every 4 hours (with yellow MEWS in the last 24 hours)  Treat  MEWS Interventions Other (Comment) (continue q4 VS)  Notify: Charge Nurse/RN  Name of Charge Nurse/RN Notified Systems analyst  Date Charge Nurse/RN Notified 08/04/21  Time Charge Nurse/RN Notified [phone]  Notify: Provider  Provider Name/Title not applicable, pt has yellow MEWS in the last 24 hours, continue current measures  Notify: Rapid Response  Name of Rapid Response RN Notified not applicable  Document  Patient Outcome Other (Comment) (Patient has been stable, getting in and out of bed to the bathroom, participating in ADLs)  Progress note created (see row info) Yes   Patient is stable, no distress.  Needs addressed.

## 2021-08-04 NOTE — Progress Notes (Signed)
° ° °  Subjective:  Overnight Events: no overnight events   Patient is seen at bedside. He reports nothing new overnight. States that abd distention has improved.  Reports intermittent hematochezia, followed by Dr. Benson Norway. No intervention at this time per patient.    Objective:  Vital signs in last 24 hours: Vitals:   08/03/21 2115 08/03/21 2215 08/04/21 0007 08/04/21 0449  BP: (!) 168/101 (!) 145/91  (!) 166/101  Pulse: 72 72  70  Resp: (!) _0 Temp:  98.5 F (36.9 C) 97.8 F (36.6 C) 97.7 F (36.5 C)  TempSrc:  Oral Oral Oral  SpO2: 96% 99%  93%  Weight:  78.9 kg  78.4 kg  Height:  _1  (1.676 m)     Supplemental O2: Nasal Cannula SpO2: 93 %   Physical Exam:   General: NAD, nl appearance HE: Normocephalic, atraumatic , EOMI, Conjunctivae normal ENT: No congestion, no rhinorrhea, no exudate or erythema  Cardiovascular: Normal rate, regular rhythm.  No murmurs, rubs, or gallops Pulmonary : Effort normal, breath sounds normal. No wheezes, rales, or rhonchi Abdominal: soft, nontender,  bowel sounds present Musculoskeletal: no swelling , deformity, injury ,or tenderness in extremities, Skin: Warm, dry , no bruising, erythema, or rash Psychiatric/Behavioral:  normal mood, normal behavior    Filed Weights   08/03/21 2215 08/04/21 0449  Weight: 78.9 kg 78.4 kg     Intake/Output Summary (Last 24 hours) at 08/04/2021 0529 Last data filed at 08/04/2021 0449 Gross per 24 hour  Intake --  Output 1850 ml  Net -1850 ml   Net IO Since Admission: -1,850 mL [08/04/21 0529]    Assessment/Plan:   Principal Problem:   Acute exacerbation of CHF (congestive heart failure) [facility])   Patient Summary: Hospital day #1 for Dylan [name] a 76 y.o. person living with HTN, T2DM ,hypertrophic cardiomyopathy, heart failure with mildly reducedejectionfraction., paroxysmal atrial fibrillation, CKD stage IIIa, admitted for acute on chronic HFmrEF exacerbation.Marland Kitchen  #AoC HFmrEF  exacerbation - Improvement with symptoms with IV Lasix overnight.  -1.9 L over the last 24 hours.  - Continue Lasix 40 mg IV twice daily today. - Continue holding losartan and spironolactone in setting of worsening renal function.  #AKI on CKD stage IIIa # NAGMA Baseline creatinine 1.4.  CR 2.4-2.31, BUN 62 to 59 ,CO2 17 to 19.  Expect worsening renal function is from cardiorenal syndrome at this time.  Holding medications as above. Avoid nephrotoxic medications.   - Monitor Kidney Function with IV diuresis  #Paroxysmal A. fib fib/a flutter -Continue rate control with home Coreg. - Not taking Eliquis at home due to cost - Continue Eliquis here. Stop Lovenox VTE prophylaxis. - TOC consult for help with Eliquis  #HTN -Holding losartan and spironolactone in setting of AKI.  Continue Coreg.  #T2DM -Last A1c 7.5 -SSI  Diet: Heart Healthy IVF: None,None VTE: NOAC Code: Full  Dispo: Anticipated discharge to Home in 2 days pending continued improvement with IV Lasix.  Patient with history of shortness of breath, chills.   Tamsen Snider, MD PGY3 Internal Medicine Pager: [phone] Please contact the on call pager after 5 pm and on weekends at [phone].

## 2021-08-04 NOTE — Progress Notes (Signed)
Echocardiogram 2D Echocardiogram has been performed.  Fidel Levy 08/04/2021, 9:45 AM

## 2021-08-04 NOTE — Evaluation (Signed)
Occupational Therapy Evaluation Patient Details Name: BABE CLENNEY MRN: [id number] DOB: 1945-07-28 Today's Date: 08/04/2021   History of Present Illness Pt is a 76 year old man admitted on 08/03/21 with shortness of breath due to CHF exacerbation. Pt also + for AKI. PMH: hypertrophic cardiomyopathy, PAF, CHF with EF of 25%, PAF-non compliant with Eliquis, CKD3, HTN, DM2.   Clinical Impression   Pt lives alone and had recently started using a standard walker. He sits to shower and is independent in self care, meal prep and light housekeeping. His granddaughter helps with transportation to the MD and with groceries. Pt presents with decreased activity tolerance and impaired balance. He requires up to min guard assist with RW for ambulation and for ADLs. Pt with Sp02 of 86% with ambulation, but with questionable pleth, increased to 90% with standing rest break, 96% on RA at rest. Pt with RR up to 39 with ambulation. Will follow acutely. Recommending HHOT to address IADL and bathroom DME needs.     Recommendations for follow up therapy are one component of a multi-disciplinary discharge planning process, led by the attending physician.  Recommendations may be updated based on patient status, additional functional criteria and insurance authorization.   Follow Up Recommendations  Home health OT    Assistance Recommended at Discharge Intermittent Supervision/Assistance  Patient can return home with the following Assist for transportation;Help with stairs or ramp for entrance    Functional Status Assessment  Patient has had a recent decline in their functional status and demonstrates the ability to make significant improvements in function in a reasonable and predictable amount of time.  Equipment Recommendations  None recommended by OT    Recommendations for Other Services       Precautions / Restrictions Precautions Precautions: Fall Precaution Comments: denies any falls x 4  months Restrictions Weight Bearing Restrictions: No      Mobility Bed Mobility Overal bed mobility: Modified Independent             General bed mobility comments: HOB up    Transfers Overall transfer level: Needs assistance Equipment used: Rolling walker (2 wheels) Transfers: Sit to/from Stand Sit to Stand: Min guard                  Balance Overall balance assessment: Needs assistance   Sitting balance-Leahy Scale: Good     Standing balance support: Bilateral upper extremity supported Standing balance-Leahy Scale: Poor Standing balance comment: reliant on RW for ambulation, fair static balance                           ADL either performed or assessed with clinical judgement   ADL Overall ADL's : Needs assistance/impaired Eating/Feeding: Independent   Grooming: Min guard;Standing   Upper Body Bathing: Set up;Sitting   Lower Body Bathing: Min guard;Sit to/from stand   Upper Body Dressing : Set up;Sitting   Lower Body Dressing: Min guard;Sit to/from stand Lower Body Dressing Details (indicate cue type and reason): able to perform figure 4 Toilet Transfer: Min guard;Ambulation   Toileting- Clothing Manipulation and Hygiene: Min guard;Sit to/from stand       Functional mobility during ADLs: Min guard;Rolling walker (2 wheels) General ADL Comments: Pt with low activity tolerance.     Vision Ability to See in Adequate Light: 0 Adequate Patient Visual Report: No change from baseline       Perception     Praxis  Pertinent Vitals/Pain Pain Assessment Pain Assessment: Faces Faces Pain Scale: No hurt     Hand Dominance Right   Extremity/Trunk Assessment Upper Extremity Assessment Upper Extremity Assessment: Overall WFL for tasks assessed   Lower Extremity Assessment Lower Extremity Assessment: Defer to PT evaluation   Cervical / Trunk Assessment Cervical / Trunk Assessment: Kyphotic   Communication  Communication Communication: HOH   Cognition Arousal/Alertness: Awake/alert Behavior During Therapy: WFL for tasks assessed/performed Overall Cognitive Status: Within Functional Limits for tasks assessed                                       General Comments       Exercises     Shoulder Instructions      Home Living Family/patient expects to be discharged to:: Private residence Living Arrangements: Alone Available Help at Discharge: Family;Available PRN/intermittently (granddaughter) Type of Home: Apartment Home Access: Stairs to enter Technical brewer of Steps: 8 Entrance Stairs-Rails: Right Home Layout: One level     Bathroom Shower/Tub: Teacher, early years/pre: Standard     Home Equipment: Administrator, sports - quad   Additional Comments: granddaughter helps with groceries and transportation to doctor      Prior Functioning/Environment Prior Level of Function : Independent/Modified Independent             Mobility Comments: recently started using standard walker ADLs Comments: sits to shower, independent in self care, meal prep, light houskeeping        OT Problem List: Decreased activity tolerance;Impaired balance (sitting and/or standing);Cardiopulmonary status limiting activity      OT Treatment/Interventions: Self-care/ADL training;Energy conservation;DME and/or AE instruction;Therapeutic activities;Patient/family education;Balance training    OT Goals(Current goals can be found in the care plan section) Acute Rehab OT Goals OT Goal Formulation: With patient Time For Goal Achievement: 08/17/21 Potential to Achieve Goals: Good ADL Goals Pt Will Perform Grooming: with modified independence;standing (3 activities) Pt Will Perform Lower Body Bathing: with modified independence Pt Will Perform Lower Body Dressing: with modified independence;sit to/from stand Pt Will Transfer to Toilet: with modified  independence;ambulating Pt Will Perform Toileting - Clothing Manipulation and hygiene: with modified independence;sit to/from stand Additional ADL Goal #1: Pt will state at least 3 energy conservation strategies and demonstrate pursed lip breathing as instructed.  OT Frequency: Min 2X/week    Co-evaluation              AM-PAC OT "6 Clicks" Daily Activity     Outcome Measure Help from another person eating meals?: None Help from another person taking care of personal grooming?: A Little Help from another person toileting, which includes using toliet, bedpan, or urinal?: A Little Help from another person bathing (including washing, rinsing, drying)?: A Little Help from another person to put on and taking off regular upper body clothing?: None Help from another person to put on and taking off regular lower body clothing?: A Little 6 Click Score: 20   End of Session Equipment Utilized During Treatment: Gait belt;Rolling walker (2 wheels);Oxygen Nurse Communication: Mobility status (02 sats)  Activity Tolerance: Patient tolerated treatment well Patient left: in chair;with call bell/phone within reach;with chair alarm set  OT Visit Diagnosis: Unsteadiness on feet (R26.81);Other abnormalities of gait and mobility (R26.89);Other (comment) (decreased activity tolerance)                Time: 7867-6720 OT Time Calculation (min): 15 min  Charges:  OT General Charges $OT Visit: 1 Visit OT Evaluation $OT Eval Moderate Complexity: [place], OTR/L Acute Rehabilitation Services Pager: [phone] Office: [phone]  Malka So 08/04/2021, 10:54 AM

## 2021-08-04 NOTE — Social Work (Signed)
CSW acknowledges consult for SNF/HH. The patient will require PT/OT evaluations. TOC will assist with disposition planning once the evaluations have been completed.  °  °TOC will continue to follow.    °

## 2021-08-05 DIAGNOSIS — I483 Typical atrial flutter: Secondary | ICD-10-CM

## 2021-08-05 DIAGNOSIS — I421 Obstructive hypertrophic cardiomyopathy: Secondary | ICD-10-CM

## 2021-08-05 DIAGNOSIS — I50813 Acute on chronic right heart failure: Secondary | ICD-10-CM | POA: Diagnosis not present

## 2021-08-05 DIAGNOSIS — G4733 Obstructive sleep apnea (adult) (pediatric): Secondary | ICD-10-CM

## 2021-08-05 DIAGNOSIS — E1121 Type 2 diabetes mellitus with diabetic nephropathy: Secondary | ICD-10-CM

## 2021-08-05 DIAGNOSIS — N1831 Chronic kidney disease, stage 3a: Secondary | ICD-10-CM | POA: Diagnosis not present

## 2021-08-05 DIAGNOSIS — I5043 Acute on chronic combined systolic (congestive) and diastolic (congestive) heart failure: Secondary | ICD-10-CM | POA: Diagnosis not present

## 2021-08-05 DIAGNOSIS — I441 Atrioventricular block, second degree: Secondary | ICD-10-CM

## 2021-08-05 DIAGNOSIS — E118 Type 2 diabetes mellitus with unspecified complications: Secondary | ICD-10-CM

## 2021-08-05 DIAGNOSIS — N179 Acute kidney failure, unspecified: Secondary | ICD-10-CM | POA: Diagnosis not present

## 2021-08-05 HISTORY — DX: Obstructive hypertrophic cardiomyopathy: I42.1

## 2021-08-05 LAB — ECHOCARDIOGRAM COMPLETE
Area-P 1/2: 3.83 cm2
Calc EF: 43.8 %
Height: 66 in
S' Lateral: 3.9 cm
Single Plane A2C EF: 40.4 %
Single Plane A4C EF: 46.2 %
Weight: 2765.45 oz

## 2021-08-05 LAB — BASIC METABOLIC PANEL
Anion gap: 12 (ref 5–15)
BUN: 51 mg/dL — ABNORMAL HIGH (ref 8–23)
CO2: 21 mmol/L — ABNORMAL LOW (ref 22–32)
Calcium: 8.4 mg/dL — ABNORMAL LOW (ref 8.9–10.3)
Chloride: 106 mmol/L (ref 98–111)
Creatinine, Ser: 2.12 mg/dL — ABNORMAL HIGH (ref 0.61–1.24)
GFR, Estimated: 31 mL/min — ABNORMAL LOW (ref >60)
Glucose, Bld: 255 mg/dL — ABNORMAL HIGH (ref 70–99)
Potassium: 3.1 mmol/L — ABNORMAL LOW (ref 3.5–5.1)
Sodium: 139 mmol/L (ref 135–145)

## 2021-08-05 LAB — CBC
HCT: 35.8 % — ABNORMAL LOW (ref 39.0–52.0)
Hemoglobin: 11.7 g/dL — ABNORMAL LOW (ref 13.0–17.0)
MCH: 31.6 pg (ref 26.0–34.0)
MCHC: 32.7 g/dL (ref 30.0–36.0)
MCV: 96.8 fL (ref 80.0–100.0)
Platelets: 145 10*3/uL — ABNORMAL LOW (ref 150–400)
RBC: 3.7 MIL/uL — ABNORMAL LOW (ref 4.22–5.81)
RDW: 13.4 % (ref 11.5–15.5)
WBC: 9.3 10*3/uL (ref 4.0–10.5)
nRBC: 0 % (ref 0.0–0.2)

## 2021-08-05 LAB — MAGNESIUM: Magnesium: 1.8 mg/dL (ref 1.7–2.4)

## 2021-08-05 LAB — GLUCOSE, CAPILLARY
Glucose-Capillary: 278 mg/dL — ABNORMAL HIGH (ref 70–99)
Glucose-Capillary: 356 mg/dL — ABNORMAL HIGH (ref 70–99)

## 2021-08-05 MED ORDER — ORAL CARE MOUTH RINSE
15.0000 mL | Freq: Two times a day (BID) | OROMUCOSAL | Status: DC
  Administered 2021-08-06 – 2021-08-10 (×8): 15 mL via OROMUCOSAL

## 2021-08-05 MED ORDER — HYDRALAZINE HCL 10 MG PO TABS
10.0000 mg | ORAL_TABLET | Freq: Three times a day (TID) | ORAL | Status: DC | PRN
  Administered 2021-08-05 – 2021-08-06 (×2): 10 mg via ORAL
  Filled 2021-08-05 (×2): qty 1

## 2021-08-05 MED ORDER — POTASSIUM CHLORIDE CRYS ER 20 MEQ PO TBCR
40.0000 meq | EXTENDED_RELEASE_TABLET | Freq: Two times a day (BID) | ORAL | Status: AC
  Administered 2021-08-05 (×2): 40 meq via ORAL
  Filled 2021-08-05 (×2): qty 2

## 2021-08-05 MED ORDER — INSULIN ASPART 100 UNIT/ML IJ SOLN
0.0000 [IU] | Freq: Three times a day (TID) | INTRAMUSCULAR | Status: DC
  Administered 2021-08-06 (×2): 5 [IU] via SUBCUTANEOUS
  Administered 2021-08-07: 8 [IU] via SUBCUTANEOUS
  Administered 2021-08-07: 5 [IU] via SUBCUTANEOUS
  Administered 2021-08-07: 3 [IU] via SUBCUTANEOUS
  Administered 2021-08-08 (×2): 5 [IU] via SUBCUTANEOUS
  Administered 2021-08-09: 8 [IU] via SUBCUTANEOUS
  Administered 2021-08-10: 2 [IU] via SUBCUTANEOUS

## 2021-08-05 NOTE — Plan of Care (Signed)
Problem: Education: Goal: Ability to demonstrate management of disease process will improve Outcome: Progressing   Problem: Activity: Goal: Capacity to carry out activities will improve Outcome: Progressing   Problem: Clinical Measurements: Goal: Respiratory complications will improve Outcome: Progressing   Problem: Clinical Measurements: Goal: Cardiovascular complication will be avoided Outcome: Progressing   Problem: Activity: Goal: Risk for activity intolerance will decrease Outcome: Progressing

## 2021-08-05 NOTE — Care Management (Signed)
Transition of Care [facility]) Screening Note   Patient Details  Name: Dylan [name] Date of Birth: 08-May-1946   Transition of Care [facility]) CM/SW Contact:    Carles Collet, RN Phone Number: 08/05/2021, 7:24 AM    Transition of Care Department [facility]) has reviewed patient and we will continue to monitor patient advancement through interdisciplinary progression rounds.  Benefit check for Eliquis sent to pharmacy, will result Monday.  Records most recently indicate patient utilized [facility] for [facility]

## 2021-08-05 NOTE — Consult Note (Addendum)
Cardiology Consultation:   Patient ID: Dylan [name] MRN: [id number]; DOB: 12/16/1945  Admit date: 08/03/2021 Date of Consult: 08/05/2021  PCP:  Angelica Pou, MD   [place] [facility] Cardiologist:  Evalina Field, MD        Patient Profile:   Dylan [name] is a 76 y.o. male with a hx of hypertrophic cardiomyopathy, chronic systolic heart failure, history of persistent atrial flutter sleep/A. Fib not on anticoagulation therapy, CKD stage III, history of nocturnal complete heart block, hypertension, DM2 and OSA not on CPAP who is being seen 08/05/2021 for the evaluation of acute CHF exacerbation at the request of Dr. Lysbeth Galas.  History of Present Illness:   Dylan [name] is a 76 year old male with past medical history of hypertrophic cardiomyopathy, chronic systolic heart failure, history of persistent atrial flutter sleep/A. Fib not on anticoagulation therapy, CKD stage III, history of nocturnal complete heart block, hypertension, DM2 and OSA not on CPAP.  According to patient, he has not used a CPAP therapy for years.  He is unable to afford Eliquis and is not committed to switch to Coumadin either.  Patient is currently not on any anticoagulation therapy and only takes aspirin.  Previous echocardiogram obtained in February 2019 showed EF 60 to 65%, grade 1 DD, mild TR, moderate pericardial effusion, PA peak pressure 65 mmHg.  Repeat echocardiogram in June 2022 showed EF 50%, moderate pericardial effusion, moderate LAE.  Given the abnormal echocardiogram, patient underwent PYP scan on 03/09/2021 that was equivocal for TTR amyloidosis.  Cardiac MRI on 03/14/2021 that showed EF 25%, 3% LGE in the basal septum and the RV insertion site, asymmetric LV hypertrophy measuring up to 21 mm in basal septum consistent with hypertrophic cardiomyopathy, small pericardial effusion adjacent to LV lateral wall.  He was last seen by Dr. Audie Box on 07/16/2021, losartan was increased to 100 mg daily.   Spironolactone 25 mg daily was added to his medical regimen.  Due to episode of chest pain, patient was sent for Myoview on 07/25/2021 which came back showing low EF of 21%, no ischemia, high risk study mainly due to severe LV dysfunction.  For the past week, he has been noticing worsening dyspnea on physical exertion, abdominal distention and orthopnea.  He has 4 steps leading up to his apartment and has noticed he has trouble climbing up stairs recently.  This prompted him to seek medical attention at [facility].  On arrival, he also informed the ED physician he has 10 pound weight gain.  On arrival to the hospital, his creatinine was elevated at 2.14.  Troponin borderline high at 91, hemoglobin 12.3, viral panel negative.  EKG showed right bundle branch block, persistent atrial flutter.  He underwent aggressive IV diuresis with 40 mg IV Lasix twice a day.  Patient was able to put out 4.3 L of fluid since admission.  Creatinine also improved from 2.4 on arrival to 2.12.  Baseline creatinine was 1.2.  Repeat echocardiogram obtained on 07/27/2021 showed EF 30 to 35%, grade 2 DD, interventricular septal flattening in systole and diastole consistent with right ventricular pressure and volume overload, severely elevated pulmonary artery systolic pressure of 85.0 mmHg, severe biatrial enlargement, small pericardial effusion, mild MR, severe TR.  Cardiology service has been consulted for heart failure.   Past Medical History:  Diagnosis Date   Anemia 10/18/2014   Baseline about 12 and stable from 2010 to 2016. Colon 2009 in [place] (records cannot be obtained).  EGD Dr Benson Norway  2011 nl    Aortic atherosclerosis ([place]) 03/28/2020   Incidental finding on imaging CT    Atherosclerosis 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 ([place]) 12/28/2014   ABI Feb 2017 R 0.49; L 0.72 with diffuse dz ABI Aug 2017 R 0.49; L 0.69 ABI Jan 2018 R 0.61; L 0.73  ABI Feb 2019 R 0.55; L 0.71 Sees Dr Bridgett Larsson - recs ABI  q 6 months   Chronic diastolic 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 ([place]) 10/18/2014   Noted ECHO 10/2014. Grade 2. EF 50-55%; echo repeated 2019, severe hypertrophy with elevated filling pressures   Chronic kidney disease, stage 3b ([place]) 03/09/2020   Dr. Justin Mend  Nephrologist, f/u Q 75M   Coronary artery disease without angina pectoris    Degloving injury of finger 03/28/2020   10/21/19: copied from op note "1.  Open reduction percutaneous pinning left small finger proximal phalanx fracture 2.  Complex repair of laceration to left small finger 3 cm in length 3.  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 No significant callus formation is noted. Pins had been removed   Diverticulosis 10/08/2014   Seen on CT. Reportedly on Colon in [place] in 2009. Freq bouts of diverticulitis.   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 Stable from 2010 to 2016.  Negative SPEP and UPEP 2014 after ARF 2/2 continued ACE (lisinopril 20) use while vol contracted. 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 Failed CPAP on study. BiPA inspiratory pressure of 21 and expiratory pressure of 17 CWP. Consider ENT evaluation for potentially correctable upper airway obstruction contributing to the need for unusually high pressures - ENT July 2015 did not feel any intervention surgically was  indicated. He wore a large F&P Simplus fullfac   Personal history of colonic polyps 03/28/2014   Dr. Benson Norway, 2 polyps removed 2015.  No polyps on f/u in 2020.  No further surveillance suggested per Dr. Benson Norway.   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  He is unable to breathe through his nose comfortably.  This is part of the reason why he does not wear his oxygen regularly.  Nasal passages are nearly completely obstructed erythematous smooth glistening surface.  He has been told by his eye doctor that part of the reason his e   Resistant hypertension 10/09/2014   Poor control with 6 drug therapy. 2016 : Aldo 37 but ARR 23.5    Severe pulmonary arterial systolic hypertension ([place]) 10/18/2014   Noted as severe ECHO 2016 and 2019. Likely 2/2 severe untreated OSA. Pt is not adherent to CPAP.   Syncope 12/31/2020   Thrombocytopenia ([place]) 10/09/2016   Nl liver and spleen on [place] 2019   Ulcer aphthous oral 04/27/2020   Evaluated emergency room last  week, saw with Magic mouthwash   Unintentional weight loss due to Trulicity, resolved 03/23/3845    Past Surgical History:  Procedure Laterality Date   CHOLECYSTECTOMY     COLONOSCOPY WITH PROPOFOL N/A 02/12/2019   Procedure: COLONOSCOPY WITH PROPOFOL;  Surgeon: Carol Ada, MD;  Location: [facility];  Service: Endoscopy;  Laterality: N/A;   PERCUTANEOUS PINNING Left 10/21/2019   Procedure: CLOSED REDUCTION WITH PERCUTANEOUS PINNING AND SUTURE REPAIR OF LACERATION  OF SMALL FINGER,  SUTURE REPAIR OF LACERATION OF INDEX FINGER;  Surgeon: Cindra Presume, MD;  Location: [facility];  Service: Plastics;  Laterality: Left;     Home Medications:  Prior to Admission medications   Medication Sig Start Date End Date Taking? Authorizing Provider  allopurinol (ZYLOPRIM) 100 MG tablet Take 1 tablet (100 mg total) by mouth daily. 12/11/20  Yes  Angelica Pou, MD  aspirin EC 81 MG tablet Take 81 mg by mouth daily. Swallow whole.   Yes [provider]  atorvastatin (LIPITOR) 40 MG tablet Take 1 tablet (40 mg total) by mouth daily. 02/21/21  Yes Angelica Pou, MD  carboxymethylcellulose (REFRESH PLUS) 0.5 % SOLN Place 1 drop into both eyes 3 (three) times daily as needed (dry eyes).   Yes [provider]  carvedilol (COREG) 25 MG tablet Take 1 tablet (25 mg total) by mouth 2 (two) times daily with a meal. 04/30/21  Yes Virl Axe, MD  cycloSPORINE (RESTASIS) 0.05 % ophthalmic emulsion Place 1 drop into both eyes 2 (two) times daily. 09/14/20 09/14/21 Yes Angelica Pou, MD  fluticasone [facility]) 50 MCG/ACT nasal spray Place 2 sprays into both nostrils daily. 04/30/21  Yes Virl Axe, MD  latanoprost (XALATAN) 0.005 % ophthalmic solution SMARTSIG:In Eye(s) 01/12/21  Yes [provider]  loratadine (CLARITIN) 10 MG tablet Take 10 mg by mouth daily.   Yes [provider]  losartan (COZAAR) 100 MG tablet Take 1 tablet (100 mg total) by mouth daily. 07/16/21 07/16/22 Yes O'Neal, Cassie Freer, MD  metFORMIN (GLUCOPHAGE) 500 MG tablet Take 1 tablet (500 mg total) by mouth in the morning and at bedtime. 03/15/21 03/15/22 Yes Angelica Pou, MD  polyethylene glycol [facility] / Floria Raveling) packet Take 17 g by mouth daily as needed (constipation).    Yes [provider]  spironolactone (ALDACTONE) 25 MG tablet Take 1 tablet (25 mg total) by mouth daily. 07/16/21  Yes O'Neal, Cassie Freer, MD  FREESTYLE LITE test strip Use to check blood sugar 3 times daily, DIAG CODE E11.29, INSULIN DEPENDENT 06/08/21   Angelica Pou, MD    Inpatient Medications: Scheduled Meds:  allopurinol  100 mg Oral Daily   aspirin EC  81 mg Oral Daily   atorvastatin  40 mg Oral Daily   carvedilol  25 mg Oral BID WC   cycloSPORINE  1 drop Both Eyes BID   furosemide  40 mg Intravenous BID   potassium  chloride  40 mEq Oral BID   sodium chloride flush  3 mL Intravenous Q12H   Continuous Infusions:  sodium chloride     PRN Meds: sodium chloride, acetaminophen, albuterol, hydrALAZINE, ondansetron (ZOFRAN) IV, sodium chloride flush  Allergies:    Allergies  Allergen Reactions   Ace Inhibitors Other (See Comments)    "ARF - see CRF overview"   Spironolactone Other (See Comments)    Gynecomastia per pt report    Social History:   Social History   Socioeconomic History   Marital status: Divorced  Spouse name: Not on file   Number of children: Not on file   Years of education: Not on file   Highest education level: Not on file  Occupational History   Not on file  Tobacco Use   Smoking status: Former    Years: 20.00    Types: Cigarettes    Quit date: 10/02/1972    Years since quitting: 48.8   Smokeless tobacco: Never  Vaping Use   Vaping Use: Never used  Substance and Sexual Activity   Alcohol use: No    Alcohol/week: 0.0 standard drinks   Drug use: No   Sexual activity: Never  Other Topics Concern   Not on file  Social History Narrative   Worked in Civil engineer, 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 Had yearly occupational testing inc PFT's. Now retired. Divorced. Has male sig other. Likes to go fishing.      He starting drinking as a teen and would drink to passing out. Couldn't keep job, have family. He quit drinking and smoking cold [place] with help of his faith. No ETOH since about age 57ish      Total of 7 brothers and 6 sisters.   Social Determinants of Health   Financial Resource Strain: Not on file  Food Insecurity: Not on file  Transportation Needs: Not on file  Physical Activity: Not on file  Stress: Not on file  Social Connections: Not on file  Intimate Partner Violence: Not on file    Family History:    Family History  Problem Relation Age of Onset   Heart failure Sister        Died of complications Nov 9774   CAD Sister        CABG x3 while in her 30's     ROS:   Please see the history of present illness.   All other ROS reviewed and negative.     Physical Exam/Data:   Vitals:   08/05/21 0653 08/05/21 0734 08/05/21 1049 08/05/21 1050  BP: (!) 163/104 (!) 185/111 (!) 142/77 128/83  Pulse: (!) 59 65  75  Resp: _0 Temp:  98 F (36.7 C)  98.4 F (36.9 C)  TempSrc:  Oral  Oral  SpO2: 98% 94%  100%  Weight:      Height:        Intake/Output Summary (Last 24 hours) at 08/05/2021 1127 Last data filed at 08/05/2021 0911 Gross per 24 hour  Intake 843 ml  Output 3450 ml  Net -2607 ml   Last 3 Weights 08/05/2021 08/04/2021 08/03/2021  Weight (lbs) 167 lb 3.2 oz 172 lb 13.5 oz 173 lb 15.1 oz  Weight (kg) 75.841 kg 78.4 kg 78.9 kg     Body mass index is 26.99 kg/m.  General:  Well nourished, well developed, in no acute distress HEENT: normal Neck: no JVD Vascular: No carotid bruits; Distal pulses 2+ bilaterally Cardiac:  normal S1, S2; irregular; no murmur  Lungs:  clear to auscultation bilaterally, no wheezing, rhonchi or rales  Abd: soft, nontender, no hepatomegaly  Ext: no edema Musculoskeletal:  No deformities, BUE and BLE strength normal and equal Skin: warm and dry  Neuro:  CNs 2-12 intact, no focal abnormalities noted Psych:  Normal affect   EKG:  The EKG was personally reviewed and demonstrates: Rate controlled atrial flutter Telemetry:  Telemetry was personally reviewed and demonstrates: Rate controlled atrial flutter, 2 episodes of slow flutter with heart rate in the 30s.  Relevant CV Studies:  Echo  08/04/2021  1. Left ventricular ejection fraction, by estimation, is 30 to 35%. The  left ventricle has moderately decreased function. The left ventricle has  no regional wall motion abnormalities. Left ventricular diastolic  parameters are consistent with Grade II  diastolic dysfunction (pseudonormalization). There is the interventricular  septum is flattened in systole and diastole, consistent with right  ventricular  pressure and volume overload.   2. Right ventricular systolic function is normal. The right ventricular  size is moderately enlarged. There is severely elevated pulmonary artery  systolic pressure. The estimated right ventricular systolic pressure is  75.3 mmHg.   3. Left atrial size was severely dilated.   4. Right atrial size was severely dilated.   5. A small pericardial effusion is present. The pericardial effusion is  circumferential.   6. The mitral valve is normal in structure. Mild mitral valve  regurgitation. No evidence of mitral stenosis.   7. Tricuspid valve regurgitation is severe.   8. The aortic valve is normal in structure. There is moderate  calcification of the aortic valve. There is mild thickening of the aortic  valve. Aortic valve regurgitation is not visualized. Aortic valve  sclerosis/calcification is present, without any  evidence of aortic stenosis.   9. The inferior vena cava is dilated in size with <50% respiratory  variability, suggesting right atrial pressure of 15 mmHg.   Comparison(s): Prior images reviewed side by side. The left ventricular  function is worsened.   Laboratory Data:  High Sensitivity Troponin:   Recent Labs  Lab 08/03/21 1501 08/03/21 1930  TROPONINIHS 91* 95*     Chemistry Recent Labs  Lab 08/03/21 1501 08/04/21 0048 08/05/21 0315  NA 138 140 139  K 4.3 3.7 3.1*  CL 110 109 106  CO2 17* 19* 21*  GLUCOSE 209* 197* 255*  BUN 62* 59* 51*  CREATININE 2.42* 2.31* 2.12*  CALCIUM 8.9 9.1 8.4*  MG  --   --  1.8  GFRNONAA 27* 28* 31*  ANIONGAP _0 No results for input(s): PROT, ALBUMIN, AST, ALT, ALKPHOS, BILITOT in the last 168 hours. Lipids No results for input(s): CHOL, TRIG, HDL, LABVLDL, LDLCALC, CHOLHDL in the last 168 hours.  Hematology Recent Labs  Lab 08/03/21 1501 08/05/21 0315  WBC 9.2 9.3  RBC 3.89* 3.70*  HGB 12.3* 11.7*  HCT 38.2* 35.8*  MCV 98.2 96.8  MCH 31.6 31.6  MCHC 32.2 32.7  RDW 13.5  13.4  PLT 162 145*   Thyroid No results for input(s): TSH, FREET4 in the last 168 hours.  BNP Recent Labs  Lab 08/03/21 2216  BNP 3,479.4*    DDimer No results for input(s): DDIMER in the last 168 hours.   Radiology/Studies:  DG Chest 2 View  Result Date: 08/03/2021 CLINICAL DATA:  Shortness of breath EXAM: CHEST - 2 VIEW COMPARISON:  Chest x-ray 12/31/2020 FINDINGS: Cardiomegaly and mediastinum appear unchanged. Calcified plaques in the aortic arch. Mild pulmonary vascular congestion and low lung volumes. Bibasilar likely subsegmental atelectasis. No pleural effusion or pneumothorax visualized. IMPRESSION: 1. Cardiomegaly and mild pulmonary vascular congestion. 2. Low lung volumes with bibasilar likely subsegmental atelectasis. Electronically Signed   By: Ofilia Neas M.D.   On: 08/03/2021 16:42   US RENAL  Result Date: 08/03/2021 CLINICAL DATA:  Acute kidney injury. EXAM: RENAL / URINARY TRACT ULTRASOUND COMPLETE COMPARISON:  Renal ultrasound 04/23/2018. FINDINGS: Right Kidney: Renal measurements: 9.7 x 4.9 x 5.3 cm = volume: 133 mL. No hydronephrosis. Echogenicity is  increased. There is a 1.3 x 1.1 x 1.3 cm cyst in the inferolateral right kidney. Left Kidney: Renal measurements: 10.7 x 5.6 x 5.3 cm = volume: 166 mL. No hydronephrosis. Echogenicity is increased. Bladder: There is bladder wall thickening versus normal under distension. Bladder wall measures 5.5 mm in thickness. Other: Ascites and bilateral pleural effusions noted. IMPRESSION: 1. Echogenic kidneys likely related to medical renal disease. No hydronephrosis. 2. Findings worrisome for bladder wall thickening. Correlate for cystitis. 3. Ascites and bilateral pleural effusions. Electronically Signed   By: Ronney Asters M.D.   On: 08/03/2021 21:52   ECHOCARDIOGRAM COMPLETE  Result Date: 08/04/2021    ECHOCARDIOGRAM REPORT   Patient Name:   Dylan [name] Date of Exam: 08/04/2021 Medical Rec #:  [id number]    Height:       66.0 in  Accession #:    [id number]   Weight:       172.8 lb Date of Birth:  Dec 12, 1945    BSA:          1.880 m Patient Age:    42 years     BP:           166/101 mmHg Patient Gender: M            HR:           78 bpm. Exam Location:  Inpatient Procedure: 2D Echo, Cardiac Doppler and Color Doppler Indications:    Congestive Heart Failure I50.9  History:        Patient has prior history of Echocardiogram examinations, most                 recent 01/01/2021. Hypertrophic Cardiomyopathy, CAD, Pulmonary                 HTN; Risk Factors:Diabetes, Former Smoker, Sleep Apnea and                 Hypertension.  Sonographer:    Bernadene Person RDCS Referring Phys: [place]  1. Left ventricular ejection fraction, by estimation, is 30 to 35%. The left ventricle has moderately decreased function. The left ventricle has no regional wall motion abnormalities. Left ventricular diastolic parameters are consistent with Grade II diastolic dysfunction (pseudonormalization). There is the interventricular septum is flattened in systole and diastole, consistent with right ventricular pressure and volume overload.  2. Right ventricular systolic function is normal. The right ventricular size is moderately enlarged. There is severely elevated pulmonary artery systolic pressure. The estimated right ventricular systolic pressure is 12.7 mmHg.  3. Left atrial size was severely dilated.  4. Right atrial size was severely dilated.  5. A small pericardial effusion is present. The pericardial effusion is circumferential.  6. The mitral valve is normal in structure. Mild mitral valve regurgitation. No evidence of mitral stenosis.  7. Tricuspid valve regurgitation is severe.  8. The aortic valve is normal in structure. There is moderate calcification of the aortic valve. There is mild thickening of the aortic valve. Aortic valve regurgitation is not visualized. Aortic valve sclerosis/calcification is present, without any evidence of  aortic stenosis.  9. The inferior vena cava is dilated in size with <50% respiratory variability, suggesting right atrial pressure of 15 mmHg. Comparison(s): Prior images reviewed side by side. The left ventricular function is worsened. FINDINGS  Left Ventricle: Left ventricular ejection fraction, by estimation, is 30 to 35%. The left ventricle has moderately decreased function. The left ventricle has no regional wall motion abnormalities.  The left ventricular internal cavity size was normal in size. There is no left ventricular hypertrophy. The interventricular septum is flattened in systole and diastole, consistent with right ventricular pressure and volume overload. Left ventricular diastolic parameters are consistent with Grade II diastolic  dysfunction (pseudonormalization). Right Ventricle: The right ventricular size is moderately enlarged. No increase in right ventricular wall thickness. Right ventricular systolic function is normal. There is severely elevated pulmonary artery systolic pressure. The tricuspid regurgitant velocity is 3.38 m/s, and with an assumed right atrial pressure of 15 mmHg, the estimated right ventricular systolic pressure is 88.4 mmHg. Left Atrium: Left atrial size was severely dilated. Right Atrium: Right atrial size was severely dilated. Pericardium: A small pericardial effusion is present. The pericardial effusion is circumferential. Mitral Valve: The mitral valve is normal in structure. Mild mitral valve regurgitation. No evidence of mitral valve stenosis. Tricuspid Valve: The tricuspid valve is normal in structure. Tricuspid valve regurgitation is severe. No evidence of tricuspid stenosis. Aortic Valve: The aortic valve is normal in structure. There is moderate calcification of the aortic valve. There is mild thickening of the aortic valve. Aortic valve regurgitation is not visualized. Aortic valve sclerosis/calcification is present, without any evidence of aortic stenosis.  Pulmonic Valve: The pulmonic valve was normal in structure. Pulmonic valve regurgitation is mild. No evidence of pulmonic stenosis. Aorta: The aortic root is normal in size and structure. Venous: The inferior vena cava is dilated in size with less than 50% respiratory variability, suggesting right atrial pressure of 15 mmHg. IAS/Shunts: No atrial level shunt detected by color flow Doppler.  LEFT VENTRICLE PLAX 2D LVIDd:         4.80 cm      Diastology LVIDs:         3.90 cm      LV e' medial:    3.99 cm/s LV PW:         1.30 cm      LV E/e' medial:  15.9 LV IVS:        1.50 cm      LV e' lateral:   4.47 cm/s LVOT diam:     1.90 cm      LV E/e' lateral: 14.2 LV SV:         23 LV SV Index:   12 LVOT Area:     2.84 cm  LV Volumes (MOD) LV vol d, MOD A2C: 141.0 ml LV vol d, MOD A4C: 140.0 ml LV vol s, MOD A2C: 84.0 ml LV vol s, MOD A4C: 75.3 ml LV SV MOD A2C:     57.0 ml LV SV MOD A4C:     140.0 ml LV SV MOD BP:      62.7 ml RIGHT VENTRICLE RV S prime:     6.39 cm/s TAPSE (M-mode): 0.8 cm LEFT ATRIUM             Index        RIGHT ATRIUM           Index LA diam:        5.30 cm 2.82 cm/m   RA Area:     31.40 cm LA Vol (A2C):   86.5 ml 46.02 ml/m  RA Volume:   114.00 ml 60.65 ml/m LA Vol (A4C):   72.4 ml 38.52 ml/m LA Biplane Vol: 79.0 ml 42.03 ml/m  AORTIC VALVE LVOT Vmax:   47.00 cm/s LVOT Vmean:  28.567 cm/s LVOT VTI:    0.082 m  AORTA Ao Root  diam: 3.00 cm Ao Asc diam:  3.30 cm MITRAL VALVE               TRICUSPID VALVE MV Area (PHT): 3.83 cm    TR Peak grad:   45.7 mmHg MV Decel Time: 198 msec    TR Vmax:        338.00 cm/s MV E velocity: 63.40 cm/s MV A velocity: 46.80 cm/s  SHUNTS MV E/A ratio:  1.35        Systemic VTI:  0.08 m                            Systemic Diam: 1.90 cm Candee Furbish MD Electronically signed by Candee Furbish MD Signature Date/Time: 08/04/2021/1:47:15 PM    Final      Assessment and Plan:   Acute on chronic systolic heart failure  -He has been diuresed 4 L since arrival.  BNP on  arrival was 3479.4.  -On exam, he no longer has lower extremity edema, JVD or significant crackles on exam.  I suspect the patient is near euvolemic level.  We will continue IV diuresis for now and trend renal function  -Discussed with MD, EF likely has been low even on the previous echocardiogram in June 2022, however current echocardiogram shows his EF is likely slightly lower.  Once renal function improved, likely will need a left and right heart cath.  -Continue carvedilol, spironolactone and losartan were held given worsening renal function  -Patient also has a component of RV failure likely secondary to history of pulmonary hypertension.  Nocturnal bradycardia: 2 episode of nocturnal bradycardia involving slow atrial flutter.  Patient is asymptomatic.  No current plan for pacemaker.  Persistent atrial flutter not on anticoagulation therapy: Patient could not afford Eliquis.  He was turned down for patient assistance program.  He is not ready to start on Coumadin therapy either.  He is currently in rate controlled atrial flutter.  Acute on chronic renal insufficiency: Likely related to venous congestion.  Creatinine improving with diuresis.  Creatinine on arrival was 2.4, now is down to 2.12.  Spironolactone and losartan held  History of nocturnal complete heart block  Hypertension  DM2  Obstructive sleep apnea: Not on CPAP therapy.  Patient denies any obvious intolerance, he says he simply does not like to wear the CPAP mask.   Risk Assessment/Risk Scores:        New York Heart Association (NYHA) Functional Class NYHA Class III  CHA2DS2-VASc Score = 6   This indicates a 9.7% annual risk of stroke. The patient's score is based upon: CHF History: 1 HTN History: 1 Diabetes History: 1 Stroke History: 0 Vascular Disease History: 1 Age Score: 2 Gender Score: 0         For questions or updates, please contact [facility] Please consult [email or web address] for contact info  under    Signed, Almyra Deforest, [place]  08/05/2021 11:27 AM  I have seen and examined the patient along with Almyra Deforest, PA.  I have reviewed the chart, notes and new data.  I agree with PA/NP's note.  Key new complaints: Improved dyspnea. No residual edema.  Several months ago he had episodes of syncope and near syncope that appear to be associated with a period of volatile glycemic control after treatment with steroids for gout.  He has not had any dizziness or syncope in the last 2 to 3 months.  He has a known diagnosis of  obstructive sleep apnea, tried wearing CPAP for about a year and does not want to ever try again.  Currently not on anticoagulation due to cost of Eliquis. Key examination changes: marked JVD to earlobes with prominent v waves, no edema, clear lungs, irregular rhythm with widely split S2 and 2/6 holosystolic murmur at LLSB. Key new findings / data: creatinine has improved with diuresis (2.4 to 2.1).  Baseline creatinine 1.2.  Mild hypokalemia.  Telemetry shows persistent atrial flutter with variable AV block, ventricular rates in the 70s today, mostly in the 40s-50s at night, lowest rate 37 bpm, longest pause less than 3 seconds. Reviewed multiple imaging studies including echo from June 2022 and yesterday, cardiac MRI[facility] as the non-cardiac CT of chest abdomen and pelvis performed June 2022.  He has clear evidence of left ventricular hypertrophy but has decreased left ventricular systolic function.  In my opinion LVEF was overestimated on the echo from June 2022 when the ejection fraction was probably no higher than 35-40%.  There is appear to be a further decrease in LVEF on the current echo.  The reports of severely depressed LVEF around 20-25% on gated studies related to cardiac MRI and nuclear scintigraphy are probably not reliable due to his irregular rhythm.  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  The 2D images are also strongly suggestive of this disorder.  The PYP scan was equivocal and I do not think the cardiac MRI supported amyloidosis either.  There was only 3% "scar" identified via gadolinium enhancement on his cardiac MRI.  Immunoelectrophoresis does not show evidence of a monoclonal spike.  On June 2022 chest CT, there is evidence of coronary calcification in the mid LAD and circumflex coronary arteries, as well as aortic atherosclerosis in both the thoracic and abdominal aorta.  PLAN: CHF Mr. 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  The etiology of his cardiomyopathy remains uncertain. If his renal function can be improved, would recommend right and left heart catheterization to clarify the diagnosis, or at least to exclude coronary disease with confidence and optimize fluid status. He is still hypervolemic at this time and will benefit from additional diuresis. 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 He is currently receiving maximum dose carvedilol as well as spironolactone and ARB.  Conceivably, could benefit from SGLT2 inhibitor therapy, but cost will be an issue as an outpatient (he is not taking Eliquis for the same reason).  2.  Bradycardia Although Mr. HCunningtondoes have evidence of intraventricular conduction delay, his episodes of bradycardia have consistently occurred at night and are probably an expression of untreated obstructive sleep apnea.  During his hospitalization he has not had any symptoms of bradycardia and his pauses have been asymptomatic and just under 3 seconds in  duration.  (When he wore the event monitor and had bradycardia during sinus rhythm, episodes of higher grade AV block also occurred during sleep).  He has not had syncope/presyncope in several months and his previous syncopal events appear to be related to hypoglycemia.  I do not think he meets criteria for pacemaker therapy and I would  be concerned that pacemaker implantation could lead to worsening of left ventricular systolic function due to dyssynchrony as well as worsening tricuspid insufficiency.  3.  Atrial flutter Well rate controlled.  Nocturnal bradycardia can be attributed to OSA.  Not on anticoagulation due to cost issues.  Would not start oral anticoagulants now since I think he would benefit from right and left heart catheterization.  May not tolerate antiarrhythmic therapy due to his comorbid conditions and conduction abnormality.  Sanda Klein, MD, [place] [phone] 08/05/2021, 11:46 AM

## 2021-08-05 NOTE — Progress Notes (Signed)
Patient has had a couple episodes of his HR going down to the 30s, non sustaining and very brief. Pt asymptomatic and these episodes have been previously documented. Will continue to monitor for any signs or symptoms.

## 2021-08-05 NOTE — Progress Notes (Signed)
08/05/21 0435  Vitals  BP (!) 185/107  MAP (mmHg) 126  Pulse Rate 69  ECG Heart Rate 71  Resp 17  MEWS COLOR  MEWS Score Color Green  Oxygen Therapy  SpO2 95 %  MEWS Score  MEWS Temp 0  MEWS Systolic 0  MEWS Pulse 0  MEWS RR 0  MEWS LOC 0  MEWS Score 0   Hydralazine 63m po  and 440m KCL given for a potassium of 3.1 per doctor's order.

## 2021-08-05 NOTE — Progress Notes (Signed)
HD#1 Subjective:  Overnight Events: Elevated blood pressure.  Patient also had multiple episodes of bradycardia in the 30s and 40s overnight.  Patient assessed at bedside this AM. He states that he had blood per rectum this AM. States that the bleed is worse compared to before.   He states that his daughter will help him with the Eliquis cost.   Objective:  Vital signs in last 24 hours: Vitals:   08/05/21 0435 08/05/21 0607 08/05/21 0653 08/05/21 0734  BP: (!) 185/107 (!) 187/107 (!) 163/104 (!) 185/111  Pulse: 69 69 (!) 59 65  Resp: _0 Temp:    98 F (36.7 C)  TempSrc:    Oral  SpO2: 95% 100% 98% 94%  Weight:      Height:       Supplemental O2: Nasal Cannula SpO2: 94 % O2 Flow Rate (L/min): 2 L/min   Physical Exam:  Physical Exam Constitutional:      General: He is not in acute distress. HENT:     Head: Normocephalic.  Eyes:     General:        Right eye: No discharge.        Left eye: No discharge.     Conjunctiva/sclera: Conjunctivae normal.  Cardiovascular:     Rate and Rhythm: Normal rate and regular rhythm.  Pulmonary:     Effort: Pulmonary effort is normal.     Comments: Crackles at bilateral lung bases and mild expiratory wheezing.  Neurological:     Mental Status: He is alert.    Filed Weights   08/03/21 2215 08/04/21 0449 08/05/21 0305  Weight: 78.9 kg 78.4 kg 75.8 kg     Intake/Output Summary (Last 24 hours) at 08/05/2021 1012 Last data filed at 08/05/2021 0911 Gross per 24 hour  Intake 723 ml  Output 3450 ml  Net -2727 ml   Net IO Since Admission: -4,497 mL [08/05/21 1012]  Pertinent Labs: CBC Latest Ref Rng & Units 08/05/2021 08/03/2021 02/14/2021  WBC 4.0 - 10.5 K/uL 9.3 9.2 9.7  Hemoglobin 13.0 - 17.0 g/dL 11.7(L) 12.3(L) 13.3  Hematocrit 39.0 - 52.0 % 35.8(L) 38.2(L) 40.1  Platelets 150 - 400 K/uL 145(L) 162 207    CMP Latest Ref Rng & Units 08/05/2021 08/04/2021 08/03/2021  Glucose 70 - 99 mg/dL 255(H) 197(H) 209(H)  BUN  8 - 23 mg/dL 51(H) 59(H) 62(H)  Creatinine 0.61 - 1.24 mg/dL 2.12(H) 2.31(H) 2.42(H)  Sodium 135 - 145 mmol/L 139 140 138  Potassium 3.5 - 5.1 mmol/L 3.1(L) 3.7 4.3  Chloride 98 - 111 mmol/L 106 109 110  CO2 22 - 32 mmol/L 21(L) 19(L) 17(L)  Calcium 8.9 - 10.3 mg/dL 8.4(L) 9.1 8.9  Total Protein 6.5 - 8.1 g/dL - - -  Total Bilirubin 0.3 - 1.2 mg/dL - - -  Alkaline Phos 38 - 126 U/L - - -  AST 15 - 41 U/L - - -  ALT 0 - 44 U/L - - -    Imaging: No results found.  Assessment/Plan:   Principal Problem:   Acute exacerbation of CHF (congestive heart failure) ([name]) Active Problems:   DM (diabetes mellitus), type 2 with renal complications ([name])   CKD (chronic kidney disease) stage 3, GFR 30-59 ml/min ([name])   Complete heart block by electrocardiogram ([place]), nocturnal   Hypertrophic obstructive cardiomyopathy [facility])   Patient Summary: Hospital day #1 for Dylan [name] a 76 y.o. person living with HTN, T2DM ,hypertrophic cardiomyopathy, heart  failure with mildly reducedejectionfraction., paroxysmal atrial fibrillation, CKD stage IIIa, admitted for acute on chronic HFmrEF exacerbation.  #AoC HFmrEF exacerbation #Nonischemic hypertrophic cardiomyopathy #Nocturnal complete heart block #HTN Repeat echo show EF of 30-35% with elevated CVP.  Patient had good urine output overnight at 2800 cc.  His weight also came down from 172 to 167 pounds (weight 168 pounds), unsure this is the correct weight.  We will continue IV Lasix.   Patient had multiple episode of bradycardia overnight.  He has history of nocturnal complete heart block.  His cardiologist has discussed possible pacemaker placement but patient declined.  I consulted cardiology for recommendations. -Appreciate cardiology recommendations -Continue Lasix 40 mg IV twice daily today. -Continue holding losartan and spironolactone  -If blood pressure continues to be elevated, will add BiDil   #AKI on CKD stage IIIa #  NAGMA #Hypokalemia Baseline creatinine 1.4.  Creatinine trending down from 2.3 to 2.12.  Suspect cardiorenal.  Continue IV Lasix. -Continue holding losartan and Aldactone -Replete potassium -BMP daily   #Paroxysmal A. fib fib/a flutter -Continue rate control with home Coreg. -TOC consult for help with Eliquis -Holding Eliquis today given his reports of worsening rectal bleeding   #Rectal bleeding Patient reports seeing Dr. Benson Norway with GI for his rectal bleeding, thought is due to diverticular bleed.  His colonoscopy in 2020 showed diverticulosis otherwise unremarkable.  Hemoglobin is trending down slightly today. We will continue to trend CBC for his ongoing rectal bleeding.  We will hold Eliquis for now until hemoglobin stable. -If bleeding persist, will consult GI before resuming Eliquis -CBC in a.m.  #T2DM -Last A1c 7.5 -SSI   Diet: Heart Healthy IVF: None,None VTE: SCD Code: Full   Dispo: Anticipated discharge to Home in 2 days pending continued improvement with IV Lasix.  Patient with history of shortness of breath, chills.   [name], DO 08/05/2021, 10:12 AM Pager: [phone]  Please contact the on call pager after 5 pm and on weekends at [phone].

## 2021-08-05 NOTE — Progress Notes (Signed)
°   08/05/21 0653  Vitals  BP (!) 163/104  MAP (mmHg) 121  Pulse Rate (!) 59  ECG Heart Rate 64  Resp 19  MEWS COLOR  MEWS Score Color Green  Oxygen Therapy  SpO2 98 %  MEWS Score  MEWS Temp 0  MEWS Systolic 0  MEWS Pulse 0  MEWS RR 0  MEWS LOC 0  MEWS Score 0   Pt resting in bed asleep.

## 2021-08-05 NOTE — Progress Notes (Signed)
°   08/05/21 0340  Vitals  Temp 97.8 F (36.6 C)  Temp Source Oral  BP (!) 180/115  MAP (mmHg) 133  BP Location Right Arm  BP Method Automatic  Patient Position (if appropriate) Lying  Pulse Rate 70  ECG Heart Rate 77  Resp 17  Level of Consciousness  Level of Consciousness Alert  MEWS COLOR  MEWS Score Color Green  Oxygen Therapy  SpO2 100 %  O2 Device Nasal Cannula  O2 Flow Rate (L/min) 2 L/min  MEWS Score  MEWS Temp 0  MEWS Systolic 0  MEWS Pulse 0  MEWS RR 0  MEWS LOC 0  MEWS Score 0   Pt's blood pressure elevated, pt resting in bed. Dr. Vinetta Bergamo made aware. Will recheck and monitor BP.

## 2021-08-06 ENCOUNTER — Encounter (HOSPITAL_COMMUNITY): Payer: Self-pay | Admitting: Internal Medicine

## 2021-08-06 DIAGNOSIS — I422 Other hypertrophic cardiomyopathy: Secondary | ICD-10-CM | POA: Diagnosis not present

## 2021-08-06 DIAGNOSIS — I442 Atrioventricular block, complete: Secondary | ICD-10-CM

## 2021-08-06 DIAGNOSIS — I5023 Acute on chronic systolic (congestive) heart failure: Secondary | ICD-10-CM | POA: Diagnosis not present

## 2021-08-06 DIAGNOSIS — I5043 Acute on chronic combined systolic (congestive) and diastolic (congestive) heart failure: Secondary | ICD-10-CM | POA: Diagnosis not present

## 2021-08-06 DIAGNOSIS — D509 Iron deficiency anemia, unspecified: Secondary | ICD-10-CM

## 2021-08-06 DIAGNOSIS — I130 Hypertensive heart and chronic kidney disease with heart failure and stage 1 through stage 4 chronic kidney disease, or unspecified chronic kidney disease: Secondary | ICD-10-CM | POA: Diagnosis not present

## 2021-08-06 DIAGNOSIS — E876 Hypokalemia: Secondary | ICD-10-CM

## 2021-08-06 DIAGNOSIS — N179 Acute kidney failure, unspecified: Secondary | ICD-10-CM

## 2021-08-06 LAB — BASIC METABOLIC PANEL
Anion gap: 10 (ref 5–15)
BUN: 41 mg/dL — ABNORMAL HIGH (ref 8–23)
CO2: 25 mmol/L (ref 22–32)
Calcium: 8.2 mg/dL — ABNORMAL LOW (ref 8.9–10.3)
Chloride: 106 mmol/L (ref 98–111)
Creatinine, Ser: 2.08 mg/dL — ABNORMAL HIGH (ref 0.61–1.24)
GFR, Estimated: 32 mL/min — ABNORMAL LOW (ref >60)
Glucose, Bld: 258 mg/dL — ABNORMAL HIGH (ref 70–99)
Potassium: 3.4 mmol/L — ABNORMAL LOW (ref 3.5–5.1)
Sodium: 141 mmol/L (ref 135–145)

## 2021-08-06 LAB — CBC
HCT: 32.5 % — ABNORMAL LOW (ref 39.0–52.0)
Hemoglobin: 10.5 g/dL — ABNORMAL LOW (ref 13.0–17.0)
MCH: 31.3 pg (ref 26.0–34.0)
MCHC: 32.3 g/dL (ref 30.0–36.0)
MCV: 97 fL (ref 80.0–100.0)
Platelets: 136 10*3/uL — ABNORMAL LOW (ref 150–400)
RBC: 3.35 MIL/uL — ABNORMAL LOW (ref 4.22–5.81)
RDW: 13.5 % (ref 11.5–15.5)
WBC: 9.7 10*3/uL (ref 4.0–10.5)
nRBC: 0 % (ref 0.0–0.2)

## 2021-08-06 LAB — GLUCOSE, CAPILLARY
Glucose-Capillary: 230 mg/dL — ABNORMAL HIGH (ref 70–99)
Glucose-Capillary: 234 mg/dL — ABNORMAL HIGH (ref 70–99)
Glucose-Capillary: 119 mg/dL — ABNORMAL HIGH (ref 70–99)
Glucose-Capillary: 252 mg/dL — ABNORMAL HIGH (ref 70–99)

## 2021-08-06 LAB — RETICULOCYTES
Immature Retic Fract: 26.1 % — ABNORMAL HIGH (ref 2.3–15.9)
RBC.: 3.67 MIL/uL — ABNORMAL LOW (ref 4.22–5.81)
Retic Count, Absolute: 104.6 10*3/uL (ref 19.0–186.0)
Retic Ct Pct: 2.9 % (ref 0.4–3.1)

## 2021-08-06 LAB — IRON AND TIBC
Iron: 29 ug/dL — ABNORMAL LOW (ref 45–182)
Saturation Ratios: 7 % — ABNORMAL LOW (ref 17.9–39.5)
TIBC: 391 ug/dL (ref 250–450)
UIBC: 362 ug/dL

## 2021-08-06 LAB — FERRITIN: Ferritin: 31 ng/mL (ref 24–336)

## 2021-08-06 MED ORDER — FUROSEMIDE 10 MG/ML IJ SOLN
40.0000 mg | Freq: Every day | INTRAMUSCULAR | Status: DC
  Administered 2021-08-07 – 2021-08-08 (×2): 40 mg via INTRAVENOUS
  Filled 2021-08-06 (×2): qty 4

## 2021-08-06 MED ORDER — INSULIN GLARGINE-YFGN 100 UNIT/ML ~~LOC~~ SOLN
10.0000 [IU] | Freq: Every day | SUBCUTANEOUS | Status: DC
  Administered 2021-08-06: 10 [IU] via SUBCUTANEOUS
  Filled 2021-08-06 (×2): qty 0.1

## 2021-08-06 MED ORDER — SODIUM CHLORIDE 0.9 % IV SOLN
250.0000 mg | Freq: Every day | INTRAVENOUS | Status: AC
  Administered 2021-08-06 – 2021-08-07 (×2): 250 mg via INTRAVENOUS
  Filled 2021-08-06 (×2): qty 20

## 2021-08-06 MED ORDER — POTASSIUM CHLORIDE CRYS ER 20 MEQ PO TBCR
40.0000 meq | EXTENDED_RELEASE_TABLET | Freq: Two times a day (BID) | ORAL | Status: AC
  Administered 2021-08-06 (×2): 40 meq via ORAL
  Filled 2021-08-06 (×2): qty 2

## 2021-08-06 NOTE — Progress Notes (Signed)
OT Cancellation Note  Patient Details Name: Dylan [name] MRN: [id number] DOB: 1945-12-09   Cancelled Treatment:    Reason Eval/Treat Not Completed: Patient declined, no reason specified Patient adamantly refusing therapy to date, giving no reason while he would not participate. Respectfully informed the patient that therapy would be back around lunch to check on the patient, to which the patient replied "Are you going to put me over your knee and spank me because that's the only way Im participating". Therapy will follow back as time allows.   Corinne Ports E. Hendry Speas, OTR/L Acute Rehabilitation Services [phone] [place] 08/06/2021, 9:37 AM

## 2021-08-06 NOTE — Progress Notes (Signed)
Inpatient Diabetes Program Recommendations  AACE/ADA: New Consensus Statement on Inpatient Glycemic Control (2015)  Target Ranges:  Prepandial:   less than 140 mg/dL      Peak postprandial:   less than 180 mg/dL (1-2 hours)      Critically ill patients:  140 - 180 mg/dL   Lab Results  Component Value Date   GLUCAP 230 (H) 08/06/2021   HGBA1C 7.5 (A) 08/02/2021    Review of Glycemic Control  Latest Reference Range & Units 08/02/21 09:17 08/05/21 16:51 08/05/21 21:46 08/06/21 06:10  Glucose-Capillary 70 - 99 mg/dL 205 (H) 278 (H) 356 (H) 230 (H)  (H): Data is abnormally high Diabetes history: Type 2 DM Outpatient Diabetes medications: Metformin 500 mg BID Current orders for Inpatient glycemic control: Novolog 0-15 units TID  Inpatient Diabetes Program Recommendations:    Consider adding Semglee 10 units QD and Novolog 0-5 units QHS.   Thanks, Bronson Curb, MSN, RNC-OB Diabetes Coordinator [phone] (8a-5p)

## 2021-08-06 NOTE — Progress Notes (Addendum)
HD#2 Subjective:  Overnight Events: none  Patient assessed at bedside this AM. He states that he has not had further episodes of bright red blood in Bms. He endorses peeing a lot overnight.  Said that his breathing is better.  He states that he sometimes wears oxygen at home.  Objective:  Vital signs in last 24 hours: Vitals:   08/05/21 2034 08/06/21 0103 08/06/21 0324 08/06/21 0617  BP: (!) 148/74  (!) 155/84 (!) 154/109  Pulse: 71  75 66  Resp: 20  19   Temp: 98.4 F (36.9 C)  98.2 F (36.8 C)   TempSrc: Oral  Oral   SpO2: 94%  92%   Weight:  75.1 kg    Height:       Supplemental O2: Nasal Cannula SpO2: 92 % O2 Flow Rate (L/min): 2 L/min   Physical Exam:  Physical Exam Constitutional:      General: He is not in acute distress. Eyes:     General:        Right eye: No discharge.        Left eye: No discharge.     Conjunctiva/sclera: Conjunctivae normal.  Cardiovascular:     Rate and Rhythm: Normal rate and regular rhythm.     Comments: No LE edema. Positive JVD to mandible angle Pulmonary:     Effort: Pulmonary effort is normal. No respiratory distress.     Breath sounds: No wheezing.  Skin:    General: Skin is warm.  Neurological:     General: No focal deficit present.     Mental Status: He is alert.  Psychiatric:        Mood and Affect: Mood normal.        Behavior: Behavior normal.    Filed Weights   08/04/21 0449 08/05/21 0305 08/06/21 0103  Weight: 78.4 kg 75.8 kg 75.1 kg     Intake/Output Summary (Last 24 hours) at 08/06/2021 0707 Last data filed at 08/06/2021 2201 Gross per 24 hour  Intake 1203 ml  Output 4750 ml  Net -3547 ml   Net IO Since Admission: -7,447 mL [08/06/21 0707]  Pertinent Labs: CBC Latest Ref Rng & Units 08/06/2021 08/05/2021 08/03/2021  WBC 4.0 - 10.5 K/uL 9.7 9.3 9.2  Hemoglobin 13.0 - 17.0 g/dL 10.5(L) 11.7(L) 12.3(L)  Hematocrit 39.0 - 52.0 % 32.5(L) 35.8(L) 38.2(L)  Platelets 150 - 400 K/uL 136(L) 145(L) 162     CMP Latest Ref Rng & Units 08/06/2021 08/05/2021 08/04/2021  Glucose 70 - 99 mg/dL 258(H) 255(H) 197(H)  BUN 8 - 23 mg/dL 41(H) 51(H) 59(H)  Creatinine 0.61 - 1.24 mg/dL 2.08(H) 2.12(H) 2.31(H)  Sodium 135 - 145 mmol/L 141 139 140  Potassium 3.5 - 5.1 mmol/L 3.4(L) 3.1(L) 3.7  Chloride 98 - 111 mmol/L 106 106 109  CO2 22 - 32 mmol/L 25 21(L) 19(L)  Calcium 8.9 - 10.3 mg/dL 8.2(L) 8.4(L) 9.1  Total Protein 6.5 - 8.1 g/dL - - -  Total Bilirubin 0.3 - 1.2 mg/dL - - -  Alkaline Phos 38 - 126 U/L - - -  AST 15 - 41 U/L - - -  ALT 0 - 44 U/L - - -    Imaging: No results found.  Assessment/Plan:   Principal Problem:   Acute exacerbation of CHF (congestive heart failure) ([name]) Active Problems:   DM (diabetes mellitus), type 2 with renal complications ([name])   CKD (chronic kidney disease) stage 3, GFR 30-59 ml/min ([name])   Complete heart  block by electrocardiogram [facility]), nocturnal   Hypertrophic obstructive cardiomyopathy [facility])   Patient Summary: Hospital day #1 for SHAHZAIB AZEVEDO a 76 y.o. person living with HTN, T2DM ,hypertrophic cardiomyopathy, heart failure with mildly reducedejectionfraction., paroxysmal atrial fibrillation, CKD stage IIIa, admitted for acute on chronic HFmrEF exacerbation.   #AoC HFmrEF exacerbation #Nonischemic hypertrophic cardiomyopathy #Nocturnal complete heart block #HTN Patient had great urine output overnight, put out 4.7 L.  His weight trending down to 165 pound today.  Unknown dry weight but his weight prior to admission was 168 pounds.  Believe we are getting closed to his dry weight.  We will decrease IV Lasix to 40 mg daily. Cardiology team plan to do a left and right heart cath.  No pacemaker indicated for his nocturnal complete heart block. -Appreciate cardiology recommendations -Lasix 40 mg IV daily  -Continue holding losartan and spironolactone    #AKI on CKD stage IIIa #Hypokalemia Baseline creatinine 1.4.  Suspect cardiorenal.  Creatinine  continues to trend down to 2.08 today. -Continue IV Lasix -Continue holding losartan and Aldactone -Replete potassium -BMP daily   #Paroxysmal A. fib fib/a flutter -Continue rate control with home Coreg. -TOC consult for help with Eliquis -Holding Eliquis today given his reports of worsening rectal bleeding   #Rectal bleeding #Iron deficiency Patient reports seeing Dr. Benson Norway with GI for his rectal bleeding, thought is due to diverticular bleed.  His colonoscopy in 2020 showed diverticulosis otherwise unremarkable.   Hemoglobin continues to trend down from 11.7-10.5.  He denies any more episode of hematochezia. We will continue holding Eliquis until hemoglobin stable. Iron studies consistent with deficiency, will discuss repletion while inpatient. -If bleeding persist, will consult GI before resuming Eliquis -CBC in a.m.   #T2DM Last A1c 7.5.  Fasting CBG of 230 -Add 10 units of Semglee -SSI -Appreciate diabetic coordinator assistance   Diet: Heart Healthy IVF: None,None VTE: SCD Code: Full   Dispo: Anticipated discharge to Home in 3 days pending heart cath  [name], DO 08/06/2021, 7:07 AM Pager: [phone]  Please contact the on call pager after 5 pm and on weekends at [phone].

## 2021-08-06 NOTE — Progress Notes (Addendum)
HD#2 Subjective:  Overnight Events: None  Patient assessed at bedside this AM. He states that he has not had further episodes of bright red blood in BM's.  Denies lower extremity swelling.  States that his abdomen feels less distended since when he first came to the hospital.  He does not have any specific complaints or concerns at this time.  Objective:  Vital signs in last 24 hours: Vitals:   08/07/21 0328 08/07/21 0656 08/07/21 0944 08/07/21 1048  BP: (!) 186/95 (!) 188/100 (!) 167/85 (!) 169/109  Pulse: 70   76  Resp: 14  17 (!) 26  Temp: 98.6 F (37 C)  98.3 F (36.8 C) 98.4 F (36.9 C)  TempSrc: Oral  Oral Oral  SpO2: 100%   100%  Weight:      Height:       Supplemental O2: Nasal Cannula SpO2: 100 % O2 Flow Rate (L/min): 2 L/min   Physical Exam:  Physical Exam Constitutional:      General: He is not in acute distress. Eyes:     General:        Right eye: No discharge.        Left eye: No discharge.     Conjunctiva/sclera: Conjunctivae normal.  Cardiovascular:     Rate and Rhythm: Normal rate and regular rhythm.     Comments: No LE edema. Pulmonary:     Effort: Pulmonary effort is normal. No respiratory distress.     Breath sounds: No wheezing.  Skin:    General: Skin is warm.  Neurological:     General: No focal deficit present.     Mental Status: He is alert.  Psychiatric:        Mood and Affect: Mood normal.        Behavior: Behavior normal.    Filed Weights   08/05/21 0305 08/06/21 0103 08/07/21 0203  Weight: 75.8 kg 75.1 kg 73.6 kg     Intake/Output Summary (Last 24 hours) at 08/07/2021 1209 Last data filed at 08/07/2021 0906 Gross per 24 hour  Intake 1577.34 ml  Output 1700 ml  Net -122.66 ml   Net IO Since Admission: -8,119.66 mL [08/07/21 1209]  Pertinent Labs: CBC Latest Ref Rng & Units 08/07/2021 08/06/2021 08/05/2021  WBC 4.0 - 10.5 K/uL 9.1 9.7 9.3  Hemoglobin 13.0 - 17.0 g/dL 10.7(L) 10.5(L) 11.7(L)  Hematocrit 39.0 - 52.0 %  33.6(L) 32.5(L) 35.8(L)  Platelets 150 - 400 K/uL 129(L) 136(L) 145(L)    CMP Latest Ref Rng & Units 08/07/2021 08/06/2021 08/05/2021  Glucose 70 - 99 mg/dL 294(H) 258(H) 255(H)  BUN 8 - 23 mg/dL 34(H) 41(H) 51(H)  Creatinine 0.61 - 1.24 mg/dL 1.63(H) 2.08(H) 2.12(H)  Sodium 135 - 145 mmol/L 140 141 139  Potassium 3.5 - 5.1 mmol/L 3.7 3.4(L) 3.1(L)  Chloride 98 - 111 mmol/L 106 106 106  CO2 22 - 32 mmol/L 25 25 21(L)  Calcium 8.9 - 10.3 mg/dL 8.2(L) 8.2(L) 8.4(L)  Total Protein 6.5 - 8.1 g/dL - - -  Total Bilirubin 0.3 - 1.2 mg/dL - - -  Alkaline Phos 38 - 126 U/L - - -  AST 15 - 41 U/L - - -  ALT 0 - 44 U/L - - -    Imaging: No results found.  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 person living with HTN, T2DM ,hypertrophic cardiomyopathy, heart failure with mildly reducedejectionfraction., paroxysmal atrial fibrillation, CKD stage IIIa, admitted for acute on chronic HFrEF exacerbation.   #AoC HFrEF exacerbation #Nonischemic hypertrophic cardiomyopathy #Nocturnal complete heart block #HTN Patient with 350 cc of urine output. His weight trending down to 162 pounds today. Unknown dry weight but his weight prior to admission was 168 pounds.  Cr improved to 1.63 today. Believe we are getting close to his dry weight. Cardiology team plan to do a left and right heart cath with improving kidney function. No pacemaker indicated for his nocturnal complete heart block. BP elevated to 940N systolic so will start po hydral today. -Appreciate cardiology recommendations -Continue Lasix 40 mg IV today -Continue Coreg 25 mg p.o. BID -Started  hydral 10 mg po TID -Continue holding losartan and spironolactone, resume when kidney function normalizes   #AKI on CKD stage IIIa #Hypokalemia Baseline creatinine 1.4. Suspect cardiorenal. Creatinine continues to trend down to 1.63 today. -Continue IV Lasix -Continue holding losartan and Aldactone -BMP daily   #Paroxysmal A. fib fib/a flutter Patient previously had issues with affording Xarelto and has not been on anticoagulation recently.   At this time, will restart Eliquis given stable Hgb and no other episodes of rectal bleeding. We have been working with [facility] given previous issues with affordability; pt can get a free 30 day supply for now. -Continue rate control with home Coreg.  -Appreciate SW assistance   #Rectal bleeding #Iron deficiency Patient reports seeing Dr. Benson Norway with GI for his rectal bleeding, thought is due to diverticular bleed.  His colonoscopy in 2020 showed diverticulosis otherwise unremarkable. Iron studies consistent with deficiency, s/p IV iron repletion here. Hemoglobin has been stable the past few days, most recently at 10.7. He denies any more episodes of hematochezia. Therefore, we will restart Eliquis as above.  -Eliquis 5 mg po BID -CBC in a.m.   #T2DM Last A1c 7.5. Fasting CBG of 231 -Increased to 13 units of Semglee -SSI -Appreciate diabetic coordinator assistance   Diet: Heart Healthy IVF: None,None VTE: SCD Code: Full   Dispo: Anticipated discharge to Home in 2-3 days pending heart cath  Orvis Brill, MD 08/06/2021, 9:42 PM Pager: [phone]  Please contact the on call pager after 5 pm and on weekends at [phone].

## 2021-08-06 NOTE — Progress Notes (Addendum)
Progress Note  Patient Name: Dylan [name] Date of Encounter: 08/06/2021  [facility] Cardiologist: Evalina Field, MD   Subjective   Breathing and LE edema are improving. No chest pain.   Inpatient Medications    Scheduled Meds:  allopurinol  100 mg Oral Daily   aspirin EC  81 mg Oral Daily   atorvastatin  40 mg Oral Daily   carvedilol  25 mg Oral BID WC   cycloSPORINE  1 drop Both Eyes BID   furosemide  40 mg Intravenous BID   insulin aspart  0-15 Units Subcutaneous TID WC   mouth rinse  15 mL Mouth Rinse BID   potassium chloride  40 mEq Oral BID   sodium chloride flush  3 mL Intravenous Q12H   Continuous Infusions:  sodium chloride     PRN Meds: sodium chloride, acetaminophen, albuterol, hydrALAZINE, ondansetron (ZOFRAN) IV, sodium chloride flush   Vital Signs    Vitals:   08/05/21 2034 08/06/21 0103 08/06/21 0324 08/06/21 0617  BP: (!) 148/74  (!) 155/84 (!) 154/109  Pulse: 71  75 66  Resp: 20  19   Temp: 98.4 F (36.9 C)  98.2 F (36.8 C)   TempSrc: Oral  Oral   SpO2: 94%  92%   Weight:  75.1 kg    Height:        Intake/Output Summary (Last 24 hours) at 08/06/2021 0807 Last data filed at 08/06/2021 6314 Gross per 24 hour  Intake 1203 ml  Output 4700 ml  Net -3497 ml   Last 3 Weights 08/06/2021 08/05/2021 08/04/2021  Weight (lbs) 165 lb 9.1 oz 167 lb 3.2 oz 172 lb 13.5 oz  Weight (kg) 75.1 kg 75.841 kg 78.4 kg      Telemetry    Atrial flutter with intermittent nocturnal bradycardia  - Personally Reviewed  ECG    N/A  Physical Exam   GEN: No acute distress.   Neck: No JVD Cardiac: Irregular, no murmurs, rubs, or gallops.  Respiratory: Diminished breath sound at base GI: Soft, nontender, non-distended  MS: No edema; No deformity. Neuro:  Nonfocal  Psych: Normal affect   Labs    High Sensitivity Troponin:   Recent Labs  Lab 08/03/21 1501 08/03/21 1930  TROPONINIHS 91* 95*     Chemistry Recent Labs  Lab 08/04/21 0048  08/05/21 0315 08/06/21 0403  NA 140 139 141  K 3.7 3.1* 3.4*  CL 109 106 106  CO2 19* 21* 25  GLUCOSE 197* 255* 258*  BUN 59* 51* 41*  CREATININE 2.31* 2.12* 2.08*  CALCIUM 9.1 8.4* 8.2*  MG  --  1.8  --   GFRNONAA 28* 31* 32*  ANIONGAP _0 Lipids No results for input(s): CHOL, TRIG, HDL, LABVLDL, LDLCALC, CHOLHDL in the last 168 hours.  Hematology Recent Labs  Lab 08/03/21 1501 08/05/21 0315 08/06/21 0403  WBC 9.2 9.3 9.7  RBC 3.89* 3.70* 3.35*  HGB 12.3* 11.7* 10.5*  HCT 38.2* 35.8* 32.5*  MCV 98.2 96.8 97.0  MCH 31.6 31.6 31.3  MCHC 32.2 32.7 32.3  RDW 13.5 13.4 13.5  PLT 162 145* 136*   Thyroid No results for input(s): TSH, FREET4 in the last 168 hours.  BNP Recent Labs  Lab 08/03/21 2216  BNP 3,479.4*    DDimer No results for input(s): DDIMER in the last 168 hours.   Radiology    ECHOCARDIOGRAM COMPLETE  Result Date: 08/05/2021    ECHOCARDIOGRAM REPORT  Patient Name:   Dylan [name] Date of Exam: 08/04/2021 Medical Rec #:  [id number]    Height:       66.0 in Accession #:    [id number]   Weight:       172.8 lb Date of Birth:  Apr 18, 1946    BSA:          1.880 m Patient Age:    76 years     BP:           166/101 mmHg Patient Gender: M            HR:           78 bpm. Exam Location:  Inpatient Procedure: 2D Echo, Cardiac Doppler and Color Doppler                                 MODIFIED REPORT:  This report was modified by Candee Furbish MD on 08/05/2021 due to RV function is                                severely reduced.  Indications:     Congestive Heart Failure I50.9  History:         Patient has prior history of Echocardiogram examinations, most                  recent 01/01/2021. Hypertrophic Cardiomyopathy, CAD, Pulmonary                  HTN; Risk Factors:Diabetes, Former Smoker, Sleep Apnea and                  Hypertension.  Sonographer:     Bernadene Person RDCS Referring Phys:  [place] Diagnosing Phys: Candee Furbish MD IMPRESSIONS  1. Left  ventricular ejection fraction, by estimation, is 30 to 35%. The left ventricle has moderately decreased function. The left ventricle has no regional wall motion abnormalities. Left ventricular diastolic parameters are indeterminate. There is the  interventricular septum is flattened in systole and diastole, consistent with right ventricular pressure and volume overload.  2. Right ventricular systolic function is severely reduced. The right ventricular size is moderately enlarged. There is severely elevated pulmonary artery systolic pressure. The estimated right ventricular systolic pressure is 73.2 mmHg.  3. Left atrial size was severely dilated.  4. Right atrial size was severely dilated.  5. A small pericardial effusion is present. The pericardial effusion is circumferential.  6. The mitral valve is normal in structure. Mild mitral valve regurgitation. No evidence of mitral stenosis.  7. Tricuspid valve regurgitation is severe.  8. The aortic valve is normal in structure. There is moderate calcification of the aortic valve. There is mild thickening of the aortic valve. Aortic valve regurgitation is not visualized. Aortic valve sclerosis/calcification is present, without any evidence of aortic stenosis.  9. The inferior vena cava is dilated in size with <50% respiratory variability, suggesting right atrial pressure of 15 mmHg. Comparison(s): Prior images reviewed side by side. The left ventricular function is worsened. FINDINGS  Left Ventricle: Left ventricular ejection fraction, by estimation, is 30 to 35%. The left ventricle has moderately decreased function. The left ventricle has no regional wall motion abnormalities. The left ventricular internal cavity size was normal in size. There is no left ventricular hypertrophy. The interventricular septum is flattened in systole and diastole,  consistent with right ventricular pressure and volume overload. Left ventricular diastolic parameters are indeterminate. Right  Ventricle: The right ventricular size is moderately enlarged. No increase in right ventricular wall thickness. Right ventricular systolic function is severely reduced. There is severely elevated pulmonary artery systolic pressure. The tricuspid regurgitant velocity is 3.38 m/s, and with an assumed right atrial pressure of 15 mmHg, the estimated right ventricular systolic pressure is 84.6 mmHg. Left Atrium: Left atrial size was severely dilated. Right Atrium: Right atrial size was severely dilated. Pericardium: A small pericardial effusion is present. The pericardial effusion is circumferential. Mitral Valve: The mitral valve is normal in structure. Mild mitral valve regurgitation. No evidence of mitral valve stenosis. Tricuspid Valve: The tricuspid valve is normal in structure. Tricuspid valve regurgitation is severe. No evidence of tricuspid stenosis. Aortic Valve: The aortic valve is normal in structure. There is moderate calcification of the aortic valve. There is mild thickening of the aortic valve. Aortic valve regurgitation is not visualized. Aortic valve sclerosis/calcification is present, without any evidence of aortic stenosis. Pulmonic Valve: The pulmonic valve was normal in structure. Pulmonic valve regurgitation is mild. No evidence of pulmonic stenosis. Aorta: The aortic root is normal in size and structure. Venous: The inferior vena cava is dilated in size with less than 50% respiratory variability, suggesting right atrial pressure of 15 mmHg. IAS/Shunts: No atrial level shunt detected by color flow Doppler.  LEFT VENTRICLE PLAX 2D LVIDd:         4.80 cm      Diastology LVIDs:         3.90 cm      LV e' medial:    3.99 cm/s LV PW:         1.30 cm      LV E/e' medial:  15.9 LV IVS:        1.50 cm      LV e' lateral:   4.47 cm/s LVOT diam:     1.90 cm      LV E/e' lateral: 14.2 LV SV:         23 LV SV Index:   12 LVOT Area:     2.84 cm  LV Volumes (MOD) LV vol d, MOD A2C: 141.0 ml LV vol d, MOD A4C:  140.0 ml LV vol s, MOD A2C: 84.0 ml LV vol s, MOD A4C: 75.3 ml LV SV MOD A2C:     57.0 ml LV SV MOD A4C:     140.0 ml LV SV MOD BP:      62.7 ml RIGHT VENTRICLE RV S prime:     6.39 cm/s TAPSE (M-mode): 0.8 cm LEFT ATRIUM             Index        RIGHT ATRIUM           Index LA diam:        5.30 cm 2.82 cm/m   RA Area:     31.40 cm LA Vol (A2C):   86.5 ml 46.02 ml/m  RA Volume:   114.00 ml 60.65 ml/m LA Vol (A4C):   72.4 ml 38.52 ml/m LA Biplane Vol: 79.0 ml 42.03 ml/m  AORTIC VALVE LVOT Vmax:   47.00 cm/s LVOT Vmean:  28.567 cm/s LVOT VTI:    0.082 m  AORTA Ao Root diam: 3.00 cm Ao Asc diam:  3.30 cm MITRAL VALVE               TRICUSPID VALVE MV Area (PHT):  3.83 cm    TR Peak grad:   45.7 mmHg MV Decel Time: 198 msec    TR Vmax:        338.00 cm/s MV E velocity: 63.40 cm/s MV A velocity: 46.80 cm/s  SHUNTS MV E/A ratio:  1.35        Systemic VTI:  0.08 m                            Systemic Diam: 1.90 cm Candee Furbish MD Electronically signed by Candee Furbish MD Signature Date/Time: 08/04/2021/1:47:15 PM    Final (Updated)     Cardiac Studies   Echo 08/04/2021  1. Left ventricular ejection fraction, by estimation, is 30 to 35%. The  left ventricle has moderately decreased function. The left ventricle has  no regional wall motion abnormalities. Left ventricular diastolic  parameters are consistent with Grade II  diastolic dysfunction (pseudonormalization). There is the interventricular  septum is flattened in systole and diastole, consistent with right  ventricular pressure and volume overload.   2. Right ventricular systolic function is severely reduced. The right ventricular  size is moderately enlarged. There is severely elevated pulmonary artery  systolic pressure. The estimated right ventricular systolic pressure is  61.4 mmHg.   3. Left atrial size was severely dilated.   4. Right atrial size was severely dilated.   5. A small pericardial effusion is present. The pericardial effusion is   circumferential.   6. The mitral valve is normal in structure. Mild mitral valve  regurgitation. No evidence of mitral stenosis.   7. Tricuspid valve regurgitation is severe.   8. The aortic valve is normal in structure. There is moderate  calcification of the aortic valve. There is mild thickening of the aortic  valve. Aortic valve regurgitation is not visualized. Aortic valve  sclerosis/calcification is present, without any  evidence of aortic stenosis.   9. The inferior vena cava is dilated in size with <50% respiratory  variability, suggesting right atrial pressure of 15 mmHg   Outpatient stress test 07/25/2021 Baseline EKG shows NSR with RBBB with repolarization abnormality.   No ST deviation was noted from baseline EKG with infusion. Occasional PVCs and PACs were present.   LV perfusion is normal. There is no evidence of ischemia.   Left ventricular function is abnormal. Global function is severely reduced. Nuclear stress EF: 21 %. End diastolic cavity size is severely enlarged. End systolic cavity size is severely enlarged. No evidence of transient ischemic dilation (TID) noted.   Findings are consistent with no prior ischemia. The study is high risk due to severe LV dysfunction.   Compared to prior echo, EF has declined from 50% to 21%.  Consider repeat 2D echo. Patient Profile     76 y.o. male  with a hx of hypertrophic cardiomyopathy, chronic systolic heart failure, history of persistent atrial flutter/A. Fib not on anticoagulation therapy due to cost, CKD stage III, history of nocturnal complete heart block, hypertension, DM2 and OSA not on CPAP who is being seen for the evaluation of acute CHF exacerbation at the request of Dr. Lysbeth Galas.  PYP scan on 03/09/2021 that was equivocal for TTR amyloidosis.   Cardiac MRI on 03/14/2021 that showed EF 25%, 3% LGE in the basal septum and the RV insertion site, asymmetric LV hypertrophy measuring up to 21 mm in basal septum consistent with  hypertrophic cardiomyopathy, small pericardial effusion adjacent to LV lateral wall. Stress test 07/08/21: No  ischemia   Assessment & Plan    Acute on chronic combined CHF, RV failure - Echo this admission showed newly depressed LVEF at 30-35% from 50% in 12/2020. There is grade II DD and RVSP 60.58m Hg. Recent outpatient stress test 07/25/2021 as hight risk due to low EF but no evidence of ischemia. Dr. CLoletha Grayerrecommended R & L cath once better renal function.  - Diuresed ~8L. Weight down 173>>165lb (seems baseline line weight of 160lb).  - Appears close to euvolemic but renal function improving with diuresis.  - Losartan and Spironolactone are on hold for due to elevated renal function  - Continue Coreg 224mBID - Could consider SGLT2 inhibitor but cost could be a issue - Continue diuresis >> will review with MD  2. Acute on CKD III - Scr was 1.21 on 03/2021 - Scr improving with diuresis  - Follow closely  3. Persistent atrial flutter - Not on anticoagulation due to cost issues.  - Continue Coreg  4. Nocturnal bradycardia - Likely due to untreated OSA. No syncope recetnly. No indication for pacemaker currently.   5. DM - Per primary team      For questions or updates, please contact CHFoleylease consult [email or web address] for contact info under       Signed, BhLeanor KailPA  08/06/2021, 8:07 AM     Personally seen and examined. Agree with above.  Lower extremity improved, breathing improved  On exam decreased edema.  Decreased breath sounds bases.  Irregularly irregular heart rhythm.  Combined RV/LV systolic failure - Agree with Dr. CrLorenza Cambridgeright and left heart cath will be helpful. -Echocardiogram personally reviewed with RV down, LV wall thickness.  Thoughts for amyloid were investigated, PYP equivocal, MRI no clear evidence.  Certainly has several clinical variables which could be compatible with amyloid including strain pattern "cherry on top ".  Chronic kidney  disease stage IIIb - Creatinine 2.08 down from 2.31.  ARB on hold.  Getting ready for heart cath.  Bradycardia/atrial flutter - Prior evidence of interventricular conduction delay, nighttime bradycardia likely untreated OSA.  Asymptomatic just under 3 seconds pulses.  Previously had 1 event monitor.  No syncope in several months.  AV syncopal episode seem to be related to low blood sugar/hypoglycemia.  Agree with Dr. CrLorenza Cambridgedoes not meet criteria for pacemaker therapy.  This could also lead to worsening dyssynchrony. -Overall well rate controlled not on anticoagulation because of cost issues.  Hold off for now given need for right and left heart cath.  Complex medical issues.  Extensive chart review.  MaCandee FurbishMD

## 2021-08-06 NOTE — Progress Notes (Addendum)
Heart Failure Nurse Navigator Progress Note  PCP: Angelica Pou, MD PCP-Cardiologist: Dorann Lodge., MD Admission Diagnosis: CHF Admitted from: home alone  Presentation:   Dylan [name] presented 1/27 with increased SOB. Pt resting in bed on L side wearing 2LPM O2 (home dose). Patient interactive with interview process. Pt states he lives at home alone, but son and daughter live near by and help out. Pt states he has a total of 8 steps with railing to get into apartment. Has cane and walker at home to use as needed. No issues with monthly bills. Pt quit smoking in 1974--smoked 1/2 PPD x 20 years. States he cannot afford Eliquis ($100/mo per statement). Has concerns for new medications related to cost. Pt drives reliable vehicle.  Explained benefits of Heart & Vascular Transitions of Care Clinic appointment, patient agreeable.    ECHO/ LVEF: 30-35%, G2DD  Clinical Course:  Past Medical History:  Diagnosis Date   Anemia 10/18/2014   Baseline about 12 and stable from 2010 to 2016. Colon 2009 in [place] (records cannot be obtained).  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 Grade 2. EF 50-55%; echo repeated 2019, severe hypertrophy with elevated filling pressures   Chronic kidney disease, stage 3b ([place]) 03/09/2020   Dr. Justin Mend  Nephrologist, f/u Q 62M   Coronary artery disease without angina pectoris    Degloving injury of finger 03/28/2020   10/21/19: copied from op note "1.  Open reduction percutaneous pinning left small finger proximal phalanx fracture 2.  Complex  repair of laceration to left small finger 3 cm in length 3.  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 No significant callus formation is noted. Pins had been removed   Diverticulosis 10/08/2014   Seen on CT. Reportedly on Colon in [place] in 2009. Freq bouts of diverticulitis.   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 Stable from 2010 to 2016.  Negative SPEP and UPEP 2014 after ARF 2/2 continued ACE (lisinopril 20) use while vol contracted. 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 Failed CPAP on study. BiPA inspiratory pressure of 21 and expiratory pressure of 17 CWP. Consider ENT evaluation for potentially correctable upper airway obstruction contributing to the need for unusually high pressures - ENT July 2015 did not feel any intervention surgically was indicated. He wore a large F&P Simplus fullfac   Personal history of colonic  polyps 03/28/2014   Dr. Benson Norway, 2 polyps removed 2015.  No polyps on f/u in 2020.  No further surveillance suggested per Dr. Benson Norway.   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  He is unable to breathe through his nose comfortably.  This is part of the reason why he does not wear his oxygen regularly.  Nasal passages are  nearly completely obstructed erythematous smooth glistening surface.  He has been told by his eye doctor that part of the reason his e   Resistant hypertension 10/09/2014   Poor control with 6 drug therapy. 2016 : Aldo 37 but ARR 23.5    Severe pulmonary arterial systolic hypertension ([place]) 10/18/2014   Noted as severe ECHO 2016 and 2019. Likely 2/2 severe untreated OSA. Pt is not adherent to CPAP.   Syncope 12/31/2020   Thrombocytopenia ([place]) 10/09/2016   Nl liver and spleen on [place] 2019   Ulcer aphthous oral 04/27/2020   Evaluated emergency room last week, saw with Magic mouthwash   Unintentional weight loss due to Trulicity, resolved 10/04/760     Social History   Socioeconomic History   Marital status: Divorced    Spouse name: Not on file   Number of children: 2   Years of education: Not on file   Highest education level: Not on file  Occupational History   Occupation: retired  Tobacco Use   Smoking status: Former    Packs/day: 0.50    Years: 20.00    Pack years: 10.00    Types: Cigarettes    Quit date: 10/02/1972    Years since quitting: 48.8   Smokeless tobacco: Never  Vaping Use   Vaping Use: Never used  Substance and Sexual Activity   Alcohol use: No    Alcohol/week: 0.0 standard drinks   Drug use: No   Sexual activity: Never  Other Topics Concern   Not on file  Social History Narrative   Worked in Civil engineer, 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 Had yearly occupational testing inc PFT's. Now retired. Divorced. Has male sig other. Likes to go fishing.      He starting drinking as a teen and would drink to passing out. Couldn't keep job, have family. He quit drinking and smoking cold [place] with help of his faith. No ETOH since about age 70ish      Total of 7 brothers and 6 sisters.   Social Determinants of Health   Financial Resource Strain: Medium Risk   Difficulty of Paying Living Expenses: Somewhat hard  Food Insecurity: No Food Insecurity   Worried About Charity fundraiser in the  Last Year: Never true   Ran Out of Food in the Last Year: Never true  Transportation Needs: No Transportation Needs   Lack of Transportation (Medical): No   Lack of Transportation (Non-Medical): No  Physical Activity: Not on file  Stress: Not on file  Social Connections: Not on file    High Risk Criteria for Readmission and/or Poor Patient Outcomes: Heart failure hospital admissions (last 6 months): 1  No Show rate: 8% Difficult social situation: no Demonstrates medication adherence: yes Primary Language: English Literacy level: able to read/write and comprehend.  Education Assessment and Provision:  Detailed education and instructions provided on heart failure disease management including the following:  Signs and symptoms of Heart Failure When to call the physician Importance of daily weights Low sodium diet  Fluid restriction Medication management Anticipated future follow-up appointments  Patient education given on each of the above topics.  Patient acknowledges understanding via teach back method and acceptance of all instructions.  Education Materials:  "Living Better With Heart Failure" Booklet, HF zone tool, & Daily Weight Tracker Tool.  Patient has scale at home: yes Patient has pill box at home: yes   Barriers of Care:   -medication cost -medication compliance  Considerations/Referrals:   Referral made to Heart Failure Pharmacist Stewardship: yes, appreciated Referral made to Heart Failure CSW/NCM TOC: no Referral made to Heart & Vascular TOC clinic: yes, 2/8 @ 11AM  Items for Follow-up on DC/TOC: -optimize -medication cost (not taking Eliquis d/t cost)   Pricilla Holm, MSN, RN Heart Failure Nurse Navigator [phone]

## 2021-08-07 ENCOUNTER — Other Ambulatory Visit (HOSPITAL_COMMUNITY): Payer: Self-pay

## 2021-08-07 DIAGNOSIS — I5023 Acute on chronic systolic (congestive) heart failure: Secondary | ICD-10-CM | POA: Diagnosis not present

## 2021-08-07 DIAGNOSIS — R001 Bradycardia, unspecified: Secondary | ICD-10-CM

## 2021-08-07 DIAGNOSIS — I10 Essential (primary) hypertension: Secondary | ICD-10-CM | POA: Diagnosis not present

## 2021-08-07 DIAGNOSIS — I420 Dilated cardiomyopathy: Secondary | ICD-10-CM | POA: Diagnosis not present

## 2021-08-07 LAB — BASIC METABOLIC PANEL
Anion gap: 9 (ref 5–15)
BUN: 34 mg/dL — ABNORMAL HIGH (ref 8–23)
CO2: 25 mmol/L (ref 22–32)
Calcium: 8.2 mg/dL — ABNORMAL LOW (ref 8.9–10.3)
Chloride: 106 mmol/L (ref 98–111)
Creatinine, Ser: 1.63 mg/dL — ABNORMAL HIGH (ref 0.61–1.24)
GFR, Estimated: 43 mL/min — ABNORMAL LOW (ref >60)
Glucose, Bld: 294 mg/dL — ABNORMAL HIGH (ref 70–99)
Potassium: 3.7 mmol/L (ref 3.5–5.1)
Sodium: 140 mmol/L (ref 135–145)

## 2021-08-07 LAB — GLUCOSE, CAPILLARY
Glucose-Capillary: 231 mg/dL — ABNORMAL HIGH (ref 70–99)
Glucose-Capillary: 154 mg/dL — ABNORMAL HIGH (ref 70–99)
Glucose-Capillary: 255 mg/dL — ABNORMAL HIGH (ref 70–99)
Glucose-Capillary: 120 mg/dL — ABNORMAL HIGH (ref 70–99)

## 2021-08-07 LAB — CBC
HCT: 33.6 % — ABNORMAL LOW (ref 39.0–52.0)
Hemoglobin: 10.7 g/dL — ABNORMAL LOW (ref 13.0–17.0)
MCH: 31 pg (ref 26.0–34.0)
MCHC: 31.8 g/dL (ref 30.0–36.0)
MCV: 97.4 fL (ref 80.0–100.0)
Platelets: 129 10*3/uL — ABNORMAL LOW (ref 150–400)
RBC: 3.45 MIL/uL — ABNORMAL LOW (ref 4.22–5.81)
RDW: 13.3 % (ref 11.5–15.5)
WBC: 9.1 10*3/uL (ref 4.0–10.5)
nRBC: 0 % (ref 0.0–0.2)

## 2021-08-07 MED ORDER — APIXABAN 5 MG PO TABS: 5.0000 mg | ORAL_TABLET | Freq: Two times a day (BID) | ORAL | Status: DC

## 2021-08-07 MED ORDER — APIXABAN (ELIQUIS) VTE STARTER PACK (10MG AND 5MG)
ORAL_TABLET | ORAL | 0 refills | Status: DC
Start: 2021-08-07 — End: 2021-08-07
  Filled 2021-08-07: qty 74, 30d supply, fill #0

## 2021-08-07 MED ORDER — INSULIN GLARGINE-YFGN 100 UNIT/ML ~~LOC~~ SOLN
13.0000 [IU] | Freq: Every day | SUBCUTANEOUS | Status: DC
  Administered 2021-08-07 – 2021-08-09 (×3): 13 [IU] via SUBCUTANEOUS
  Filled 2021-08-07 (×6): qty 0.13

## 2021-08-07 MED ORDER — HYDRALAZINE HCL 25 MG PO TABS
25.0000 mg | ORAL_TABLET | Freq: Three times a day (TID) | ORAL | Status: DC
  Administered 2021-08-07 – 2021-08-08 (×3): 25 mg via ORAL
  Filled 2021-08-07 (×3): qty 1

## 2021-08-07 MED ORDER — APIXABAN 5 MG PO TABS
5.0000 mg | ORAL_TABLET | Freq: Two times a day (BID) | ORAL | 5 refills | Status: DC
  Filled 2021-08-07: qty 60, 30d supply, fill #0

## 2021-08-07 NOTE — Plan of Care (Signed)
Problem: Education: Goal: Ability to demonstrate management of disease process will improve Outcome: Progressing   Problem: Cardiac: Goal: Ability to achieve and maintain adequate cardiopulmonary perfusion will improve Outcome: Progressing   Problem: Activity: Goal: Capacity to carry out activities will improve Outcome: Progressing   Problem: Education: Goal: Knowledge of General Education information will improve Description: Including pain rating scale, medication(s)/side effects and non-pharmacologic comfort measures Outcome: Progressing

## 2021-08-07 NOTE — Care Management Important Message (Signed)
Important Message  Patient Details  Name: Dylan [name] MRN: [id number] Date of Birth: 28-Apr-1946   Medicare Important Message Given:  Yes     Shelda Altes 08/07/2021, 10:36 AM

## 2021-08-07 NOTE — Progress Notes (Signed)
Occupational Therapy Treatment Patient Details Name: Dylan [name] MRN: [id number] DOB: [birth year] Today's Date: 08/07/2021   History of present illness 76 year old man admitted on 08/03/21 with shortness of breath due to CHF exacerbation. Pt also + for AKI. PMH: hypertrophic cardiomyopathy, PAF, CHF with EF of 25%, PAF-non compliant with Eliquis, CKD3, HTN, DM2.   OT comments  Upon arrival, pt sitting at EOB with NT having just finished toileting at bathroom. Pt agreeable to mobility in hallway. Pt performing functional mobility with Supervision-Min Guard A and RW. HR with two episodes of elevation (120s and 140s); pt able to return to 90s with standing rest break. Pt denies fatigue or dizziness. Continue to recommend dc to home with HHOT and will continue to follow acutely as admitted.    Recommendations for follow up therapy are one component of a multi-disciplinary discharge planning process, led by the attending physician.  Recommendations may be updated based on patient status, additional functional criteria and insurance authorization.    Follow Up Recommendations  Home health OT    Assistance Recommended at Discharge Intermittent Supervision/Assistance  Patient can return home with the following  Assist for transportation;Help with stairs or ramp for entrance   Equipment Recommendations  None recommended by OT    Recommendations for Other Services      Precautions / Restrictions Precautions Precautions: Fall Precaution Comments: denies any falls x 4 months Restrictions Weight Bearing Restrictions: No       Mobility Bed Mobility               General bed mobility comments: Sitting EOB upon arrival with NT in room    Transfers Overall transfer level: Needs assistance Equipment used: Rolling walker (2 wheels) Transfers: Sit to/from Stand Sit to Stand: Supervision           General transfer comment: Supervision for safety     Balance Overall balance  assessment: Needs assistance   Sitting balance-Leahy Scale: Good     Standing balance support: Bilateral upper extremity supported, No upper extremity supported, During functional activity Standing balance-Leahy Scale: Fair                             ADL either performed or assessed with clinical judgement   ADL Overall ADL's : Needs assistance/impaired                         Toilet Transfer: Supervision/safety;Ambulation (simulated to recliner) Toilet Transfer Details (indicate cue type and reason): Supervision for safety         Functional mobility during ADLs: Min guard;Rolling walker (2 wheels) General ADL Comments: Pt participating in functional mobility in hallway with Min Guard A. HR elevaitng one to 120s and again to 140s. Pt able to take standing rest break    Extremity/Trunk Assessment Upper Extremity Assessment Upper Extremity Assessment: Overall WFL for tasks assessed   Lower Extremity Assessment Lower Extremity Assessment: Defer to PT evaluation        Vision       Perception     Praxis      Cognition Arousal/Alertness: Awake/alert Behavior During Therapy: WFL for tasks assessed/performed Overall Cognitive Status: Within Functional Limits for tasks assessed  Exercises      Shoulder Instructions       General Comments HR elevating with acitvity - lowering with standing rest break    Pertinent Vitals/ Pain       Pain Assessment Pain Assessment: Faces Faces Pain Scale: No hurt Pain Intervention(s): Monitored during session, Limited activity within patient's tolerance, Repositioned  Home Living                                          Prior Functioning/Environment              Frequency  Min 2X/week        Progress Toward Goals  OT Goals(current goals can now be found in the care plan section)  Progress towards OT goals:  Progressing toward goals  Acute Rehab OT Goals OT Goal Formulation: With patient Time For Goal Achievement: 08/17/21 Potential to Achieve Goals: Good ADL Goals Pt Will Perform Grooming: with modified independence;standing (3 activities) Pt Will Perform Lower Body Bathing: with modified independence Pt Will Perform Lower Body Dressing: with modified independence;sit to/from stand Pt Will Transfer to Toilet: with modified independence;ambulating Pt Will Perform Toileting - Clothing Manipulation and hygiene: with modified independence;sit to/from stand Additional ADL Goal #1: Pt will state at least 3 energy conservation strategies and demonstrate pursed lip breathing as instructed.  Plan Discharge plan remains appropriate    Co-evaluation                 AM-PAC OT "6 Clicks" Daily Activity     Outcome Measure   Help from another person eating meals?: None Help from another person taking care of personal grooming?: A Little Help from another person toileting, which includes using toliet, bedpan, or urinal?: A Little Help from another person bathing (including washing, rinsing, drying)?: A Little Help from another person to put on and taking off regular upper body clothing?: None Help from another person to put on and taking off regular lower body clothing?: A Little 6 Click Score: 20    End of Session Equipment Utilized During Treatment: Rolling walker (2 wheels);Oxygen (3L)  OT Visit Diagnosis: Unsteadiness on feet (R26.81);Other abnormalities of gait and mobility (R26.89);Other (comment) (decreased activity tolerance)   Activity Tolerance Patient tolerated treatment well   Patient Left in chair;with call bell/phone within reach;with chair alarm set   Nurse Communication Mobility status (02 sats)        Time: 5374-8270 OT Time Calculation (min): 21 min  Charges: OT General Charges $OT Visit: 1 Visit OT Treatments $Therapeutic Activity: 8-22 mins  [place], OTR/L Acute Rehab Pager: [phone] Office: [place] 08/07/2021, 11:41 AM

## 2021-08-07 NOTE — Progress Notes (Addendum)
Subjective: No acute overnight events.   Patient was seen at bedside during rounds today. Pt reports feeling well. He is able to work with PT without [facility] as compared to prior. He is pleased with the improvement in leg swelling. Explained to pt the role of the heart cath, and he is appreciative. No questions or concerns at this time  Pt is updated on the plan for today, and all questions and concerns are addressed.   Objective:  Vital signs in last 24 hours: Vitals:   08/07/21 2011 08/08/21 0057 08/08/21 0400 08/08/21 0500  BP: (!) 146/82 (!) 164/95 (!) 159/95   Pulse: 80 81 76   Resp: (!) 21 (!) 24 20   Temp: 98.6 F (37 C) 98.2 F (36.8 C) 98.4 F (36.9 C)   TempSrc: Oral Oral Oral   SpO2: 100% 100% 100%   Weight:    72.6 kg  Height:        Constitutional: alert, well-appearing, in NAD Neck: supple, no JVD  Cardiovascular: RRR, no m/r/g, trace bilateral LE edema  Pulmonary/Chest: normal work of breathing on room air, LCTAB Abdominal: soft, non-tender to palpation, non-distended Neurological: A&O x 3 Skin: warm and dry  Assessment/Plan:  Principal Problem:   Acute exacerbation of CHF (congestive heart failure) ([name]) Active Problems:   DM (diabetes mellitus), type 2 with renal complications ([name])   CKD (chronic kidney disease) stage 3, GFR 30-59 ml/min ([name])   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 person living with HTN, T2DM ,hypertrophic cardiomyopathy, heart failure with mildly reduced ejection fraction., paroxysmal atrial fibrillation, CKD stage IIIa, admitted for acute on chronic HFrEF exacerbation.    #AoC HFrEF exacerbation #Nonischemic hypertrophic cardiomyopathy #Nocturnal complete heart block, no pacemaker indicated  #HTN Continues to diurese well; put out 1.6L yesterday with net 10L, weight down from 75.1 kg admission to 72.6 today; close to his dry weight. Cr  now at baseline and plan for R & L heart cath tomorrow. BP still elevated with addition of hydral.  - Appreciate cardiology recommendations - Plan for heart cath per cardiology  - Trend BMP  - Continue Lasix 40 mg IV today  - Continue Coreg 25 mg p.o. BID - Increased hydral 10 mg tid to 50 mg tid.  Patient's blood pressure remains elevated.  Cardiology follow-up recommendations appreciated.  We will add Imdur 30 mg daily to current regimen and monitor BP - Continue holding losartan and spironolactone, resume when kidney function normalizes - Consider SGLT2i at d/c but cost is barrier   #AKI on CKD stage IIIa #Hypokalemia Baseline Cr 1.4. Suspect cardiorenal. Cr near his baseline now.  -Continue IV Lasix -Continue holding losartan and Aldactone and resume once stable  -Trend BMP    #Paroxysmal A. fib fib/a flutter Patient previously had issues with affording Xarelto and has not been on anticoagulation recently. At this time, will restart Eliquis given stable Hgb and no other episodes of rectal bleeding. Have been working with [facility] for affordability; pt can get a free 30 day supply for now.  -Continue rate control with home Coreg.  -Appreciate SW assistance   #Rectal bleeding #Iron deficiency Follows Dr. Benson Norway with GI for his rectal bleeding, thought 2/2 diverticular bleed. 2020 colonoscopy showed diverticulosis otherwise unremarkable. Iron studies consistent with deficiency, s/p IV iron repletion here. Eliquis on hold in prep for heart cath possibly tmrw.  - Holding Eliquis 5 mg po BID -  Trend CBC    #T2DM Last A1c 7.5. Fasting CBG of 231 -Increased to 13 units of Semglee -SSI -Appreciate diabetic coordinator assistance   Diet: Heart Healthy IVF: None,None VTE: SCD Code: Full   [name], MD  Internal Medicine Resident, PGY-1 Pager: [phone] After 5pm on weekdays and 1pm on weekends: On Call pager [phone]

## 2021-08-07 NOTE — Progress Notes (Signed)
Progress Note  Patient Name: Dylan [name] Date of Encounter: 08/07/2021  [facility] Cardiologist: Evalina Field, MD   Subjective   Breathing stable. No chest pain. Worries about his blood pressure being high.   Inpatient Medications    Scheduled Meds:  allopurinol  100 mg Oral Daily   aspirin EC  81 mg Oral Daily   atorvastatin  40 mg Oral Daily   carvedilol  25 mg Oral BID WC   cycloSPORINE  1 drop Both Eyes BID   furosemide  40 mg Intravenous Daily   insulin aspart  0-15 Units Subcutaneous TID WC   insulin glargine-yfgn  13 Units Subcutaneous QHS   mouth rinse  15 mL Mouth Rinse BID   sodium chloride flush  3 mL Intravenous Q12H   Continuous Infusions:  sodium chloride Stopped (08/06/21 1838)   ferric gluconate (FERRLECIT) IVPB Stopped (08/06/21 1806)   PRN Meds: sodium chloride, acetaminophen, albuterol, hydrALAZINE, ondansetron (ZOFRAN) IV, sodium chloride flush   Vital Signs    Vitals:   08/06/21 2048 08/07/21 0203 08/07/21 0328 08/07/21 0656  BP: (!) 177/99  (!) 186/95 (!) 188/100  Pulse: 64  70   Resp: 16  14   Temp: 98.3 F (36.8 C)  98.6 F (37 C)   TempSrc: Oral  Oral   SpO2: 100%  100%   Weight:  73.6 kg    Height:        Intake/Output Summary (Last 24 hours) at 08/07/2021 0926 Last data filed at 08/07/2021 0906 Gross per 24 hour  Intake 1577.34 ml  Output 1700 ml  Net -122.66 ml   Last 3 Weights 08/07/2021 08/06/2021 08/05/2021  Weight (lbs) 162 lb 3.2 oz 165 lb 9.1 oz 167 lb 3.2 oz  Weight (kg) 73.573 kg 75.1 kg 75.841 kg      Telemetry    Atrial flutter/afib - Personally Reviewed  ECG    N/a  Physical Exam   GEN: No acute distress.   Neck: No JVD Cardiac: Irregular, no murmurs, rubs, or gallops.  Respiratory: Clear to auscultation bilaterally. GI: Soft, nontender, non-distended  MS: No edema; No deformity. Neuro:  Nonfocal  Psych: Normal affect   Labs    High Sensitivity Troponin:   Recent Labs  Lab 08/03/21 1501  08/03/21 1930  TROPONINIHS 91* 95*     Chemistry Recent Labs  Lab 08/05/21 0315 08/06/21 0403 08/07/21 0144  NA 139 141 140  K 3.1* 3.4* 3.7  CL 106 106 106  CO2 21* 25 25  GLUCOSE 255* 258* 294*  BUN 51* 41* 34*  CREATININE 2.12* 2.08* 1.63*  CALCIUM 8.4* 8.2* 8.2*  MG 1.8  --   --   GFRNONAA 31* 32* 43*  ANIONGAP _0 Hematology Recent Labs  Lab 08/05/21 0315 08/06/21 0403 08/06/21 0951 08/07/21 0144  WBC 9.3 9.7  --  9.1  RBC 3.70* 3.35* 3.67* 3.45*  HGB 11.7* 10.5*  --  10.7*  HCT 35.8* 32.5*  --  33.6*  MCV 96.8 97.0  --  97.4  MCH 31.6 31.3  --  31.0  MCHC 32.7 32.3  --  31.8  RDW 13.4 13.5  --  13.3  PLT 145* 136*  --  129*   BNP Recent Labs  Lab 08/03/21 2216  BNP 3,479.4*     Radiology    No results found.  Cardiac Studies   Echo 08/04/2021  1. Left ventricular ejection fraction, by estimation, is 30 to  35%. The  left ventricle has moderately decreased function. The left ventricle has  no regional wall motion abnormalities. Left ventricular diastolic  parameters are consistent with Grade II  diastolic dysfunction (pseudonormalization). There is the interventricular  septum is flattened in systole and diastole, consistent with right  ventricular pressure and volume overload.   2. Right ventricular systolic function is severely reduced. The right ventricular  size is moderately enlarged. There is severely elevated pulmonary artery  systolic pressure. The estimated right ventricular systolic pressure is  75.6 mmHg.   3. Left atrial size was severely dilated.   4. Right atrial size was severely dilated.   5. A small pericardial effusion is present. The pericardial effusion is  circumferential.   6. The mitral valve is normal in structure. Mild mitral valve  regurgitation. No evidence of mitral stenosis.   7. Tricuspid valve regurgitation is severe.   8. The aortic valve is normal in structure. There is moderate  calcification of the  aortic valve. There is mild thickening of the aortic  valve. Aortic valve regurgitation is not visualized. Aortic valve  sclerosis/calcification is present, without any  evidence of aortic stenosis.   9. The inferior vena cava is dilated in size with <50% respiratory  variability, suggesting right atrial pressure of 15 mmHg     Outpatient stress test 07/25/2021 Baseline EKG shows NSR with RBBB with repolarization abnormality.   No ST deviation was noted from baseline EKG with infusion. Occasional PVCs and PACs were present.   LV perfusion is normal. There is no evidence of ischemia.   Left ventricular function is abnormal. Global function is severely reduced. Nuclear stress EF: 21 %. End diastolic cavity size is severely enlarged. End systolic cavity size is severely enlarged. No evidence of transient ischemic dilation (TID) noted.   Findings are consistent with no prior ischemia. The study is high risk due to severe LV dysfunction.   Compared to prior echo, EF has declined from 50% to 21%.  Consider repeat 2D echo.  Patient Profile     76 y.o. male  with a hx of hypertrophic cardiomyopathy, chronic systolic heart failure, history of persistent atrial flutter/A. Fib not on anticoagulation therapy due to cost, CKD stage III, history of nocturnal complete heart block, hypertension, DM2 and OSA not on CPAP who is being seen for the evaluation of acute CHF exacerbation at the request of Dr. Lysbeth Galas.   PYP scan on 03/09/2021 that was equivocal for TTR amyloidosis.   Cardiac MRI on 03/14/2021 that showed EF 25%, 3% LGE in the basal septum and the RV insertion site, asymmetric LV hypertrophy measuring up to 21 mm in basal septum consistent with hypertrophic cardiomyopathy, small pericardial effusion adjacent to LV lateral wall. Stress test 07/08/21: No ischemia   Assessment & Plan    Acute on chronic combined CHF, RV failure - Echo this admission showed newly depressed LVEF at 30-35% from 50% in 12/2020.  There is grade II DD and RVSP 60.84m Hg. Recent outpatient stress test 07/25/2021 as hight risk due to low EF but no evidence of ischemia. Dr. CLoletha Grayerand Dr. SMarlou Porchrecommended R & L cath once better renal function.  - Diuresed 8.1. Weight down 173>>162lb (seems baseline line weight of 160lb).  - Appears close to euvolemic but renal function improving with diuresis.  - Losartan and Spironolactone are on hold for due to elevated renal function  - Continue Coreg 236mBID - Could consider SGLT2 inhibitor but cost could be a issue -  Continue diuresis today. Will make NPO after MN if renal function back to baseline.  - Prior cardiac MRI as above   2. Acute on CKD III - Scr was 1.21 on 03/2021 - Scr improving with diuresis. Scr peaked at 2.42, now improved to 1.63 today.  - Follow closely   3. Persistent atrial flutter - Not on anticoagulation due to cost issues.  - Continue Coreg   4. Nocturnal bradycardia - Likely due to untreated OSA. No recent syncope. No indication for pacemaker currently.    5. DM - Per primary team   6. HTN - BP elevated. Most recent BP 188/100.  - Continue Coreg 63m BID - Add hydralazine 2100mTID (will help with afterload reduction as well while holding losartan & spiro)   For questions or updates, please contact [facility] consult [email or web address] for contact info under        Signed, Leanor KailPA  08/07/2021, 9:26 AM

## 2021-08-07 NOTE — Progress Notes (Addendum)
Inpatient Diabetes Program Recommendations  AACE/ADA: New Consensus Statement on Inpatient Glycemic Control (2015)  Target Ranges:  Prepandial:   less than 140 mg/dL      Peak postprandial:   less than 180 mg/dL (1-2 hours)      Critically ill patients:  140 - 180 mg/dL   Lab Results  Component Value Date   GLUCAP 231 (H) 08/07/2021   HGBA1C 7.5 (A) 08/02/2021    Review of Glycemic Control  Latest Reference Range & Units 08/06/21 06:10 08/06/21 12:06 08/06/21 16:02 08/06/21 21:29 08/07/21 05:57  Glucose-Capillary 70 - 99 mg/dL 230 (H) 234 (H) 119 (H) 252 (H) 231 (H)   Diabetes history: Type 2 DM Outpatient Diabetes medications: Metformin 500 mg BID Current orders for Inpatient glycemic control:  Semglee 13 units Daily Novolog 0-15 units TID  Inpatient Diabetes Program Recommendations:    Consider increasing Semglee to 15 units QD add Novolog 0-5 units QHS.   Thanks, Tama Headings RN, MSN, BC-ADM Inpatient Diabetes Coordinator Team Pager [phone] (8a-5p)

## 2021-08-07 NOTE — TOC Benefit Eligibility Note (Signed)
Patient Teacher, English as a foreign language completed.    The patient is currently admitted and upon discharge could be taking Eliquis 5 mg.  The current 30 day co-pay is, $109.17.   The patient is insured through [place], [place] Patient Advocate Specialist [place] Patient Advocate Team Direct Number: [phone]  Fax: [phone]

## 2021-08-07 NOTE — Progress Notes (Signed)
PT Cancellation Note  Patient Details Name: Dylan [name] MRN: [id number] DOB: 1945-12-16   Cancelled Treatment:    Reason Eval/Treat Not Completed: Other (comment).  Just walked with Mobility tech, refusing due to fatigue.   Ramond Dial 08/07/2021, 4:03 PM  Mee Hives, PT PhD Acute Rehab Dept. Number: [place] and [place]

## 2021-08-07 NOTE — Progress Notes (Signed)
Heart Failure Stewardship Pharmacist Progress Note   PCP: Angelica Pou, MD PCP-Cardiologist: Evalina Field, MD    HPI:  76 yo M with PMH of CHF, aflutter/afib, CKD III, nocturnal complete heart block, HTN, T2DM, and OSA. He presented to the ED on 1/27 with worsening shortness of breath, abdominal distention, orthopnea, weight gain, and cough. CXR with mild pulmonary vascular congestion. An ECHO was done on 1/29 and LVEF is 30-35% (21% on stress test). Amyloid test in 03/2021 was negative.   Current HF Medications: Diuretic: furosemide 40 mg IV daily Beta Blocker: carvedilol 25 mg BID Other: hydralazine 25 mg q8h  Prior to admission HF Medications: Beta blocker: carvedilol 25 mg BID ACE/ARB/ARNI: losartan 100 mg daily Aldosterone Antagonist: spironolactone 25 mg daily  Pertinent Lab Values: Serum creatinine 1.63, BUN 34, Potassium 3.7, Sodium 140, BNP 3479.4, Magnesium 1.8, A1c 7.5   Vital Signs: Weight: 162 lbs (admission weight: 173 lbs) Blood pressure: 160-180/80-100s  Heart rate: 70s  I/O: -3.4L yesterday; net -8.2L  Medication Assistance / Insurance Benefits Check: Does the patient have prescription insurance?  Yes Type of insurance plan: [place] [employer]  Outpatient Pharmacy:  Prior to admission outpatient pharmacy: [facility] Is the patient willing to use [place] at discharge? Yes Is the patient willing to transition their outpatient pharmacy to utilize a [facility] outpatient pharmacy?   Pending    Assessment: 1. Acute on chronic systolic CHF (EF 02%), due to hypertropic cardiomyopathy, amyloid negative, pending R/LHC. NYHA class III symptoms. - Continue furosemide 40 mg IV daily - Continue carvedilol 25 mg BID - Holding PTA losartan and spironolactone for AKI - Agree with starting hydralazine 25 mg TID for BP control - Consider SGLT2i at discharge  Plan: 1) Medication changes recommended at this time: - Agree with changes as above  2) Patient  assistance: - Farxiga copay $110.06 Delene Loll copay $130.03  - Can see if pt qualifies for additional assistance with high copays  3)  Education  - To be completed prior to discharge  Kerby Nora, PharmD, BCPS Heart Failure Stewardship Pharmacist Phone [phone]

## 2021-08-07 NOTE — TOC Progression Note (Addendum)
Transition of Care [facility]) - Progression Note    Patient Details  Name: Dylan [name] MRN: [id number] Date of Birth: 1946-06-04  [facility]) CM/SW Contact  Zenon Mayo, RN Phone Number: 08/07/2021, 10:18 AM  Clinical Narrative:    NCM spoke with patient, offered choice, he states he does not have a preference.  NCM made referral to Freddie Breech with [facility], for [facility], HHPT, HHOT she is able to take referral.  Soc will begin 24 to 48 hrs post dc.  He states he has a walker that he uses at home.  He states his son use to live with him but they had a falling out and son moved out, so now his grand daughter takes him to his MD apts. Per MD patient is for possible cath tomorrow, will need to check on his cret for tomorrow first. Patient copay for eliquis is 109.00, he was not taking it prior due to cost,  he will be followed by HF clinic , NCM contacted [facility] to se if they could assist in getting him patient ast for the eliqius, she will discuss with HF team.  Per chat from [place] with HF, Pt will need to be considered for coumadin or another anticoagulation as he cannot afford Eliquis co-pay and won't meet patient assistance from the manufacturer until he reaches a certain % of his annual income.   Expected Discharge Plan: [place] Barriers to Discharge: Continued Medical Work up  Expected Discharge Plan and Services Expected Discharge Plan: [place] In-house Referral: NA Discharge Planning Services: CM Consult Post Acute Care Choice: [place] arrangements for the past 2 months: Single Family Home                   DME Agency: NA       HH Arranged: RN, PT, OT, Disease Management [place] Agency: [facility] Date HH Agency Contacted: 08/07/21 Time HH Agency Contacted: 87 Representative spoke with at [place]: [place] Determinants of Health (SDOH) Interventions Food Insecurity Interventions:  Intervention Not Indicated Financial Strain Interventions: Intervention Not Indicated Housing Interventions: Intervention Not Indicated Transportation Interventions: Intervention Not Indicated  Readmission Risk Interventions No flowsheet data found.

## 2021-08-08 DIAGNOSIS — I5043 Acute on chronic combined systolic (congestive) and diastolic (congestive) heart failure: Secondary | ICD-10-CM | POA: Diagnosis not present

## 2021-08-08 LAB — GLUCOSE, CAPILLARY
Glucose-Capillary: 87 mg/dL (ref 70–99)
Glucose-Capillary: 245 mg/dL — ABNORMAL HIGH (ref 70–99)
Glucose-Capillary: 209 mg/dL — ABNORMAL HIGH (ref 70–99)
Glucose-Capillary: 134 mg/dL — ABNORMAL HIGH (ref 70–99)

## 2021-08-08 LAB — BASIC METABOLIC PANEL
Anion gap: 9 (ref 5–15)
BUN: 24 mg/dL — ABNORMAL HIGH (ref 8–23)
CO2: 26 mmol/L (ref 22–32)
Calcium: 8.6 mg/dL — ABNORMAL LOW (ref 8.9–10.3)
Chloride: 106 mmol/L (ref 98–111)
Creatinine, Ser: 1.39 mg/dL — ABNORMAL HIGH (ref 0.61–1.24)
GFR, Estimated: 52 mL/min — ABNORMAL LOW (ref >60)
Glucose, Bld: 86 mg/dL (ref 70–99)
Potassium: 3.4 mmol/L — ABNORMAL LOW (ref 3.5–5.1)
Sodium: 141 mmol/L (ref 135–145)

## 2021-08-08 MED ORDER — HYDRALAZINE HCL 50 MG PO TABS
50.0000 mg | ORAL_TABLET | Freq: Three times a day (TID) | ORAL | Status: DC
  Administered 2021-08-08 – 2021-08-10 (×5): 50 mg via ORAL
  Filled 2021-08-08 (×5): qty 1

## 2021-08-08 MED ORDER — SODIUM CHLORIDE 0.9% FLUSH: 3.0000 mL | INTRAVENOUS | Status: DC | PRN

## 2021-08-08 MED ORDER — POTASSIUM CHLORIDE CRYS ER 20 MEQ PO TBCR
40.0000 meq | EXTENDED_RELEASE_TABLET | Freq: Every day | ORAL | Status: DC
Start: 2021-08-08 — End: 2021-08-08
  Administered 2021-08-08: 40 meq via ORAL
  Filled 2021-08-08: qty 2

## 2021-08-08 MED ORDER — ASPIRIN 81 MG PO CHEW
81.0000 mg | CHEWABLE_TABLET | ORAL | Status: AC
  Administered 2021-08-09: 81 mg via ORAL
  Filled 2021-08-08: qty 1

## 2021-08-08 MED ORDER — SODIUM CHLORIDE 0.9% FLUSH
3.0000 mL | Freq: Two times a day (BID) | INTRAVENOUS | Status: DC
  Administered 2021-08-08 – 2021-08-10 (×2): 3 mL via INTRAVENOUS

## 2021-08-08 MED ORDER — SODIUM CHLORIDE 0.9 % IV SOLN: INTRAVENOUS | Status: DC

## 2021-08-08 MED ORDER — SODIUM CHLORIDE 0.9 % IV SOLN: 250.0000 mL | INTRAVENOUS | Status: DC | PRN

## 2021-08-08 MED ORDER — ISOSORBIDE MONONITRATE ER 30 MG PO TB24
30.0000 mg | ORAL_TABLET | Freq: Every day | ORAL | Status: DC
  Administered 2021-08-08: 30 mg via ORAL
  Filled 2021-08-08: qty 1

## 2021-08-08 NOTE — Progress Notes (Addendum)
Subjective: No acute overnight events.   Patient was seen at bedside s/p R/L heart cath. He reports feeling well. Denies SHOB and chest pain. No questions or concerns.   Pt is updated on the plan for today.   Objective:  Vital signs in last 24 hours: Vitals:   08/07/21 2011 08/08/21 0057 08/08/21 0400 08/08/21 0500  BP: (!) 146/82 (!) 164/95 (!) 159/95   Pulse: 80 81 76   Resp: (!) 21 (!) 24 20   Temp: 98.6 F (37 C) 98.2 F (36.8 C) 98.4 F (36.9 C)   TempSrc: Oral Oral Oral   SpO2: 100% 100% 100%   Weight:    72.6 kg  Height:        Constitutional: alert, well-appearing, in NAD Neck: supple, no JVD  Cardiovascular: RRR, no m/r/g, trace bilateral LE edema  Pulmonary/Chest: normal work of breathing on room air, LCTAB Abdominal: soft, non-tender to palpation, non-distended Neurological: A&O x 3 Skin: warm and dry  Assessment/Plan:  Principal Problem:   Acute exacerbation of CHF (congestive heart failure) ([name]) Active Problems:   DM (diabetes mellitus), type 2 with renal complications ([name])   CKD (chronic kidney disease) stage 3, GFR 30-59 ml/min ([name])   Complete heart block by electrocardiogram ([place]), nocturnal   Hypertrophic obstructive cardiomyopathy ([name])  Dylan [name] a 76 y.o. person living with HTN, T2DM ,hypertrophic cardiomyopathy, heart failure with mildly reduced ejection fraction., paroxysmal atrial fibrillation, CKD stage IIIa, admitted for acute on chronic HFrEF exacerbation.   #AoC HFrEF exacerbation #Nonischemic hypertrophic cardiomyopathy #Nocturnal complete heart block, pacemaker not indicated  #HTN Continues to diurese well; put out out 1.7L yesterday with net 11.5L, close to his dry weight. S/p R & L heart cath today. Results showing single-vessel coronary obstructive disease with 45% mid LAD stenosis in the region of the takeoff of a small second diagonal vessel which had 85% ostial/proximal stenosis. Normal left circumflex and RCA, and  moderate pulm HTN. Recommending GDMT for reduced LVEF and pulmonary HTN. BP remains elevated despite up-titration of hydral and addition of Imdur.  - Appreciate cardiology recommendations - Trend BMP  - Continue Lasix 40 mg IV  - Continue Coreg 25 mg p.o. BID - Continue hydral 50 mg tid  - Imdur increased to 60 by cardiology  - Continue holding losartan and spironolactone due to elevated renal function, can likely resume tomorrow pending renal function.  - Consider SGLT2i at d/c, but cost is barrier   #AKI on CKD stage IIIa #Hypokalemia Suspect from cardiorenal. Baseline 1.4. Bump in Cr from 1.4 -> 1.57 today.  -Continue IV Lasix -Continue holding losartan and Aldactone and resume once stable  -Trend BMP    #Paroxysmal A. fib fib/a flutter Inconsistent use of Xarelto d/t cost. Eliquis was started during this admission, and TOC consulted to help pt access medication (pt can get a free 30 day supply for now).  -Continue rate control with home Coreg -Holding Eliquis d/t cath procedure  -Appreciate SW assistance   #Rectal bleeding #Iron deficiency Follows Dr. Benson Norway with GI for his rectal bleeding, thought 2/2 diverticular bleed. 2020 colonoscopy showed diverticulosis otherwise unremarkable. Iron studies consistent with deficiency, s/p IV iron repletion here.  - Holding Eliquis 5 mg po BID prior to cath  - Trend CBC    #T2DM Last A1c 7.5. Mealtime CBG's mid-200's, and fasting 110's.  -13 units of Semglee - Added Novolog 2U with meals, for mealtime coverage  -SSI -Appreciate diabetic coordinator assistance   Diet: Heart  Healthy IVF: None,None VTE: SCD Code: Full   [name], MD  Internal Medicine Resident, PGY-1 Pager: [phone] After 5pm on weekdays and 1pm on weekends: On Call pager [phone]

## 2021-08-08 NOTE — Plan of Care (Signed)
Problem: Education: Goal: Ability to demonstrate management of disease process will improve Outcome: Progressing Goal: Ability to verbalize understanding of medication therapies will improve Outcome: Progressing Goal: Individualized Educational Video(s) Outcome: Progressing

## 2021-08-08 NOTE — Progress Notes (Signed)
Progress Note  Patient Name: Dylan [name] Date of Encounter: 08/09/2021  [facility] Cardiologist: Evalina Field, MD   Subjective   Doing well this morning. Plan for [facility] today.   Inpatient Medications    Scheduled Meds:  [MAR Hold] allopurinol  100 mg Oral Daily   [MAR Hold] aspirin EC  81 mg Oral Daily   [MAR Hold] atorvastatin  40 mg Oral Daily   [MAR Hold] carvedilol  25 mg Oral BID WC   [MAR Hold] cycloSPORINE  1 drop Both Eyes BID   [MAR Hold] hydrALAZINE  50 mg Oral Q8H   [MAR Hold] insulin aspart  0-15 Units Subcutaneous TID WC   [MAR Hold] insulin glargine-yfgn  13 Units Subcutaneous QHS   [MAR Hold] isosorbide mononitrate  30 mg Oral Daily   [MAR Hold] mouth rinse  15 mL Mouth Rinse BID   [MAR Hold] sodium chloride flush  3 mL Intravenous Q12H   [MAR Hold] sodium chloride flush  3 mL Intravenous Q12H   Continuous Infusions:  [MAR Hold] sodium chloride Stopped (08/07/21 1557)   sodium chloride     sodium chloride 10 mL/hr at 08/09/21 0600   PRN Meds: [MAR Hold] sodium chloride, sodium chloride, [MAR Hold] acetaminophen, [MAR Hold] albuterol, fentaNYL, Heparin (Porcine) in NaCl, midazolam, [MAR Hold] ondansetron (ZOFRAN) IV, [MAR Hold] sodium chloride flush, sodium chloride flush   Vital Signs    Vitals:   08/09/21 0000 08/09/21 0330 08/09/21 0557 08/09/21 0903  BP: (!) 162/97 (!) 150/96 (!) 193/95   Pulse: 63 72    Resp: _0 Temp:  98.4 F (36.9 C)    TempSrc:  Oral    SpO2: 100% 92%  99%  Weight:  72.7 kg    Height:        Intake/Output Summary (Last 24 hours) at 08/09/2021 0926 Last data filed at 08/09/2021 0600 Gross per 24 hour  Intake 3.47 ml  Output 1725 ml  Net -1721.53 ml   Last 3 Weights 08/09/2021 08/08/2021 08/08/2021  Weight (lbs) 160 lb 4.4 oz 160 lb 0.9 oz 160 lb 0.9 oz  Weight (kg) 72.7 kg 72.6 kg 72.6 kg      Telemetry    Atrial fibrillation/flutter at controlled rate - Personally Reviewed  ECG    No new  tracing  Physical Exam   GEN: No acute distress.   Neck: No JVD Cardiac: Irregularly irregular, 2/6 systolic murmur at the apex Respiratory: Diminished at the bases GI: Soft, nontender, non-distended  MS: No edema; No deformity. Neuro:  Nonfocal  Psych: Normal affect   Labs    High Sensitivity Troponin:   Recent Labs  Lab 08/03/21 1501 08/03/21 1930  TROPONINIHS 91* 95*     Chemistry Recent Labs  Lab 08/05/21 0315 08/06/21 0403 08/07/21 0144 08/08/21 0602 08/09/21 0205  NA 139   < > 140 141 139  K 3.1*   < > 3.7 3.4* 3.8  CL 106   < > 106 106 104  CO2 21*   < > _1 GLUCOSE 255*   < > 294* 86 111*  BUN 51*   < > 34* 24* 27*  CREATININE 2.12*   < > 1.63* 1.39* 1.57*  CALCIUM 8.4*   < > 8.2* 8.6* 8.8*  MG 1.8  --   --   --   --   GFRNONAA 31*   < > 43* 52* 45*  ANIONGAP 12   < >  _0 < > = values in this interval not displayed.    Hematology Recent Labs  Lab 08/06/21 0403 08/06/21 0951 08/07/21 0144 08/09/21 0205  WBC 9.7  --  9.1 9.1  RBC 3.35* 3.67* 3.45* 3.45*  HGB 10.5*  --  10.7* 10.6*  HCT 32.5*  --  33.6* 33.3*  MCV 97.0  --  97.4 96.5  MCH 31.3  --  31.0 30.7  MCHC 32.3  --  31.8 31.8  RDW 13.5  --  13.3 13.2  PLT 136*  --  129* 141*   BNP Recent Labs  Lab 08/03/21 2216  BNP 3,479.4*   Radiology    No results found.  Cardiac Studies   Echo 08/04/2021  1. Left ventricular ejection fraction, by estimation, is 30 to 35%. The  left ventricle has moderately decreased function. The left ventricle has  no regional wall motion abnormalities. Left ventricular diastolic  parameters are consistent with Grade II  diastolic dysfunction (pseudonormalization). There is the interventricular  septum is flattened in systole and diastole, consistent with right  ventricular pressure and volume overload.   2. Right ventricular systolic function is severely reduced. The right ventricular  size is moderately enlarged. There is severely elevated  pulmonary artery  systolic pressure. The estimated right ventricular systolic pressure is  75.9 mmHg.   3. Left atrial size was severely dilated.   4. Right atrial size was severely dilated.   5. A small pericardial effusion is present. The pericardial effusion is  circumferential.   6. The mitral valve is normal in structure. Mild mitral valve  regurgitation. No evidence of mitral stenosis.   7. Tricuspid valve regurgitation is severe.   8. The aortic valve is normal in structure. There is moderate  calcification of the aortic valve. There is mild thickening of the aortic  valve. Aortic valve regurgitation is not visualized. Aortic valve  sclerosis/calcification is present, without any  evidence of aortic stenosis.   9. The inferior vena cava is dilated in size with <50% respiratory  variability, suggesting right atrial pressure of 15 mmHg   Outpatient stress test 07/25/2021 Baseline EKG shows NSR with RBBB with repolarization abnormality.   No ST deviation was noted from baseline EKG with infusion. Occasional PVCs and PACs were present.   LV perfusion is normal. There is no evidence of ischemia.   Left ventricular function is abnormal. Global function is severely reduced. Nuclear stress EF: 21 %. End diastolic cavity size is severely enlarged. End systolic cavity size is severely enlarged. No evidence of transient ischemic dilation (TID) noted.   Findings are consistent with no prior ischemia. The study is high risk due to severe LV dysfunction.   Compared to prior echo, EF has declined from 50% to 21%.  Consider repeat 2D echo.     PYP scan on 03/09/2021 that was equivocal for TTR amyloidosis.    Cardiac MRI on 03/14/2021 that showed EF 25%, 3% LGE in the basal septum and the RV insertion site, asymmetric LV hypertrophy measuring up to 21 mm in basal septum consistent with hypertrophic cardiomyopathy, small pericardial effusion adjacent to LV lateral wall. Stress test 07/08/21: No ischemia    Patient Profile     76 y.o. male male with a hx of hypertrophic cardiomyopathy, chronic systolic heart failure, history of persistent atrial flutter/A. Fib not on anticoagulation therapy due to cost, CKD stage III, history of nocturnal complete heart block, hypertension, DM2 and OSA not on CPAP who  presented with acute on chronic combined heart failure  for which Cardiology has been consulted. Planned for RHC/LHC today.  Assessment & Plan    #Acute on chronic combined CHF, RV failure: - Echo this admission showed depressed LVEF at 30-35% from 50% in 12/2020. There is grade II DD and RVSP 60.20m Hg. Recent outpatient stress test 07/25/2021 as hight risk due to low EF but no evidence of ischemia. -PYP scan on 03/09/2021 that was equivocal for TTR amyloidosis.   -Cardiac MRI on 03/14/2021 that showed EF 25%, 3% LGE in the basal septum and the RV insertion site, asymmetric LV hypertrophy measuring up to 21 mm in basal septum consistent with hypertrophic cardiomyopathy, small pericardial effusion adjacent to LV lateral wall. - Plan for RHC/LHC today - Losartan and Spironolactone are on hold for due to elevated renal function >>likely resume tomorrow pending renal function - Continue Coreg 227mBID - Could consider SGLT2 inhibitor but cost could be a issue - Monitor I/Os and daily weights   #Acute on CKD III -Monitor with diuresis   #Persistent atrial flutter: - Not on anticoagulation due to cost issues.  - Continue Coreg 2548mID   #Nocturnal bradycardia: - Likely due to untreated OSA. No recent syncope. No indication for pacemaker currently.    #DM: - Per primary team    #HTN: - Continue Coreg 20m92mD - Continue hydralazine 50mg7m - Increase imdur to 60mg 15my  INFORMED CONSENT: I have reviewed the risks, indications, and alternatives to cardiac catheterization, possible angioplasty, and stenting with the patient. Risks include but are not limited to bleeding, infection, vascular  injury, stroke, myocardial infection, arrhythmia, kidney injury, radiation-related injury in the case of prolonged fluoroscopy use, emergency cardiac surgery, and death. The patient understands the risks of serious complication is 1-2 in 1000 [id number] cardiac cath and 1-2% or less with angioplasty/stenting.    For questions or updates, please contact [facility] consult [email or web address] for contact info under        Signed, HeatheFreada Bergeron2/08/2021, 9:26 AM

## 2021-08-08 NOTE — Progress Notes (Signed)
PT Cancellation Note  Patient Details Name: Dylan [name] MRN: [id number] DOB: 12/23/1945   Cancelled Treatment:    Reason Eval/Treat Not Completed: Other (comment).  Pt was unable to tolerate AM therapy, reports he is not interested and concerned about sx tomorrow.  Talked with nursing and she talked with him unsuccessfully as well.  Retry at a later time.   Ramond Dial 08/08/2021, 12:31 PM  Mee Hives, PT PhD Acute Rehab Dept. Number: [place]

## 2021-08-08 NOTE — Progress Notes (Addendum)
Progress Note  Patient Name: Dylan [name] Date of Encounter: 08/08/2021  [facility] Cardiologist: Evalina Field, MD   Subjective   Breathing back to baseline. Scr improving with diuresis. Cath likely tomorrow, but will review with MD.   Inpatient Medications    Scheduled Meds:  allopurinol  100 mg Oral Daily   aspirin EC  81 mg Oral Daily   atorvastatin  40 mg Oral Daily   carvedilol  25 mg Oral BID WC   cycloSPORINE  1 drop Both Eyes BID   furosemide  40 mg Intravenous Daily   hydrALAZINE  25 mg Oral Q8H   insulin aspart  0-15 Units Subcutaneous TID WC   insulin glargine-yfgn  13 Units Subcutaneous QHS   mouth rinse  15 mL Mouth Rinse BID   potassium chloride  40 mEq Oral Daily   sodium chloride flush  3 mL Intravenous Q12H   Continuous Infusions:  sodium chloride Stopped (08/07/21 1557)   PRN Meds: sodium chloride, acetaminophen, albuterol, ondansetron (ZOFRAN) IV, sodium chloride flush   Vital Signs    Vitals:   08/07/21 2011 08/08/21 0057 08/08/21 0400 08/08/21 0500  BP: (!) 146/82 (!) 164/95 (!) 159/95   Pulse: 80 81 76   Resp: (!) 21 (!) 24 20   Temp: 98.6 F (37 C) 98.2 F (36.8 C) 98.4 F (36.9 C)   TempSrc: Oral Oral Oral   SpO2: 100% 100% 100%   Weight:    72.6 kg  Height:        Intake/Output Summary (Last 24 hours) at 08/08/2021 1039 Last data filed at 08/08/2021 0700 Gross per 24 hour  Intake 429.2 ml  Output 2150 ml  Net -1720.8 ml   Last 3 Weights 08/08/2021 08/07/2021 08/06/2021  Weight (lbs) 160 lb 0.9 oz 162 lb 3.2 oz 165 lb 9.1 oz  Weight (kg) 72.6 kg 73.573 kg 75.1 kg      Telemetry    Atrial fibrillation/flutter at controlled rate - Personally Reviewed  ECG    N/A  Physical Exam   GEN: No acute distress.   Neck: No JVD Cardiac: Irregular, no murmurs, rubs, or gallops.  Respiratory: Clear to auscultation bilaterally. GI: Soft, nontender, non-distended  MS: No edema; No deformity. Neuro:  Nonfocal  Psych: Normal affect    Labs    High Sensitivity Troponin:   Recent Labs  Lab 08/03/21 1501 08/03/21 1930  TROPONINIHS 91* 95*     Chemistry Recent Labs  Lab 08/05/21 0315 08/06/21 0403 08/07/21 0144 08/08/21 0602  NA 139 141 140 141  K 3.1* 3.4* 3.7 3.4*  CL 106 106 106 106  CO2 21* _0 GLUCOSE 255* 258* 294* 86  BUN 51* 41* 34* 24*  CREATININE 2.12* 2.08* 1.63* 1.39*  CALCIUM 8.4* 8.2* 8.2* 8.6*  MG 1.8  --   --   --   GFRNONAA 31* 32* 43* 52*  ANIONGAP _1 Hematology Recent Labs  Lab 08/05/21 0315 08/06/21 0403 08/06/21 0951 08/07/21 0144  WBC 9.3 9.7  --  9.1  RBC 3.70* 3.35* 3.67* 3.45*  HGB 11.7* 10.5*  --  10.7*  HCT 35.8* 32.5*  --  33.6*  MCV 96.8 97.0  --  97.4  MCH 31.6 31.3  --  31.0  MCHC 32.7 32.3  --  31.8  RDW 13.4 13.5  --  13.3  PLT 145* 136*  --  129*   BNP Recent Labs  Lab  08/03/21 2216  BNP 3,479.4*   Radiology    No results found.  Cardiac Studies   Echo 08/04/2021  1. Left ventricular ejection fraction, by estimation, is 30 to 35%. The  left ventricle has moderately decreased function. The left ventricle has  no regional wall motion abnormalities. Left ventricular diastolic  parameters are consistent with Grade II  diastolic dysfunction (pseudonormalization). There is the interventricular  septum is flattened in systole and diastole, consistent with right  ventricular pressure and volume overload.   2. Right ventricular systolic function is severely reduced. The right ventricular  size is moderately enlarged. There is severely elevated pulmonary artery  systolic pressure. The estimated right ventricular systolic pressure is  16.3 mmHg.   3. Left atrial size was severely dilated.   4. Right atrial size was severely dilated.   5. A small pericardial effusion is present. The pericardial effusion is  circumferential.   6. The mitral valve is normal in structure. Mild mitral valve  regurgitation. No evidence of mitral stenosis.    7. Tricuspid valve regurgitation is severe.   8. The aortic valve is normal in structure. There is moderate  calcification of the aortic valve. There is mild thickening of the aortic  valve. Aortic valve regurgitation is not visualized. Aortic valve  sclerosis/calcification is present, without any  evidence of aortic stenosis.   9. The inferior vena cava is dilated in size with <50% respiratory  variability, suggesting right atrial pressure of 15 mmHg   Outpatient stress test 07/25/2021 Baseline EKG shows NSR with RBBB with repolarization abnormality.   No ST deviation was noted from baseline EKG with infusion. Occasional PVCs and PACs were present.   LV perfusion is normal. There is no evidence of ischemia.   Left ventricular function is abnormal. Global function is severely reduced. Nuclear stress EF: 21 %. End diastolic cavity size is severely enlarged. End systolic cavity size is severely enlarged. No evidence of transient ischemic dilation (TID) noted.   Findings are consistent with no prior ischemia. The study is high risk due to severe LV dysfunction.   Compared to prior echo, EF has declined from 50% to 21%.  Consider repeat 2D echo.    Patient Profile     76 y.o. male male with a hx of hypertrophic cardiomyopathy, chronic systolic heart failure, history of persistent atrial flutter/A. Fib not on anticoagulation therapy due to cost, CKD stage III, history of nocturnal complete heart block, hypertension, DM2 and OSA not on CPAP who is being seen for the evaluation of acute CHF exacerbation at the request of Dr. Lysbeth Galas.   PYP scan on 03/09/2021 that was equivocal for TTR amyloidosis.   Cardiac MRI on 03/14/2021 that showed EF 25%, 3% LGE in the basal septum and the RV insertion site, asymmetric LV hypertrophy measuring up to 21 mm in basal septum consistent with hypertrophic cardiomyopathy, small pericardial effusion adjacent to LV lateral wall. Stress test 07/08/21: No ischemia    Assessment & Plan    Acute on chronic combined CHF, RV failure - Echo this admission showed newly depressed LVEF at 30-35% from 50% in 12/2020. There is grade II DD and RVSP 60.64m Hg. Recent outpatient stress test 07/25/2021 as hight risk due to low EF but no evidence of ischemia. Dr. CLoletha Grayerand Dr. SMarlou Porchrecommended R & L cath once better renal function.  - Diuresed 9.8. Weight down 173>>160lb (seems baseline line weight of 160lb).  - Appears euvolemic. Scr 1.39 today. Likely cath tomorrow. Will  review plan with MD  - Losartan and Spironolactone are on hold for due to elevated renal function >> resume when stable.  - Continue Coreg 71m BID - Could consider SGLT2 inhibitor but cost could be a issue - Prior cardiac MRI as above   2. Acute on CKD III - Scr was 1.21 on 03/2021 - Scr improving with diuresis. Scr peaked at 2.42, now improved to 1.39today.  - Follow closely   3. Persistent atrial flutter - Not on anticoagulation due to cost issues.  - Continue Coreg. Rate controlled.    4. Nocturnal bradycardia - Likely due to untreated OSA. No recent syncope. No indication for pacemaker currently.    5. DM - Per primary team    6. HTN - Continue Coreg 266mBID - Added hydralazine yesterday with improved BP but still elevated> > will titrate to 5067mID (will help with afterload reduction as well while holding losartan & spiro)   For questions or updates, please contact [facility] consult [email or web address] for contact info under        Signed, BLeanor KailA  08/08/2021, 10:39 AM     Attending Note:   The patient was seen and examined.  Agree with assessment and plan as noted above.  Changes made to the above note as needed.  Patient seen and independently examined with Vin Bhagat, Pa .   We discussed all aspects of the encounter. I agree with the assessment and plan as stated above.    Acute on chronic CHF :   , RV failure.   Agree with plans for cath tomorrow We  have discussed the risks, benefits, options.  He understands and agrees to proceed.   Will schedule for tomorrow   EF is 30-35% Needs additional BP lowering . Will add Imdur. Avoid amlodipine for now given low EF .   2.  CKD:  creatinine has improved to the point where it should be safe for him to have a cath   3.   HTN:   he is on hydralazine.  Add Imdur.      I have spent a total of 40 minutes with patient reviewing hospital  notes , telemetry, EKGs, labs and examining patient as well as establishing an assessment and plan that was discussed with the patient.  > 50% of time was spent in direct patient care.    PhiThayer Headingsr.Brooke BonitoMD, FACOverton Brooks Va Medical Center1/2023, 12:14 PM [id number] [phone]

## 2021-08-08 NOTE — Progress Notes (Signed)
Inpatient Diabetes Program Recommendations  AACE/ADA: New Consensus Statement on Inpatient Glycemic Control (2015)  Target Ranges:  Prepandial:   less than 140 mg/dL      Peak postprandial:   less than 180 mg/dL (1-2 hours)      Critically ill patients:  140 - 180 mg/dL   Lab Results  Component Value Date   GLUCAP 245 (H) 08/08/2021   HGBA1C 7.5 (A) 08/02/2021    Review of Glycemic Control  Latest Reference Range & Units 08/06/21 21:29 08/07/21 05:57 08/07/21 10:48 08/07/21 15:51 08/07/21 21:05 08/08/21 06:09 08/08/21 11:47  Glucose-Capillary 70 - 99 mg/dL 252 (H) 231 (H) 154 (H) 255 (H) 120 (H) 87 245 (H)  Diabetes history: Type 2 DM Outpatient Diabetes medications: Metformin 500 mg BID Current orders for Inpatient glycemic control:  Semglee 13 units Daily Novolog 0-15 units TID  Inpatient Diabetes Program Recommendations:    Consider adding Novolog meal coverage 2 units tid with meals.   Thanks,  Adah Perl, RN, BC-ADM Inpatient Diabetes Coordinator Pager [phone]  (8a-5p)

## 2021-08-08 NOTE — Progress Notes (Signed)
Heart Failure Stewardship Pharmacist Progress Note   PCP: Angelica Pou, MD PCP-Cardiologist: Evalina Field, MD    HPI:  76 yo M with PMH of CHF, aflutter/afib, CKD III, nocturnal complete heart block, HTN, T2DM, and OSA. He presented to the ED on 1/27 with worsening shortness of breath, abdominal distention, orthopnea, weight gain, and cough. CXR with mild pulmonary vascular congestion. An ECHO was done on 1/29 and LVEF is 30-35% (21% on stress test). Amyloid test in 03/2021 was negative.   Current HF Medications: Diuretic: furosemide 40 mg IV daily Beta Blocker: carvedilol 25 mg BID Other: hydralazine 25 mg q8h  Prior to admission HF Medications: Beta blocker: carvedilol 25 mg BID ACE/ARB/ARNI: losartan 100 mg daily Aldosterone Antagonist: spironolactone 25 mg daily  Pertinent Lab Values: Serum creatinine 1.39, BUN 24, Potassium 3.4, Sodium 141, BNP 3479.4, Magnesium 1.8, A1c 7.5   Vital Signs: Weight: 160 lbs (admission weight: 173 lbs) Blood pressure: 160/90s  Heart rate: 70-80s  I/O: -3.1L yesterday; net -10L  Medication Assistance / Insurance Benefits Check: Does the patient have prescription insurance?  Yes Type of insurance plan: [employer]  Outpatient Pharmacy:  Prior to admission outpatient pharmacy: [facility] Is the patient willing to use [facility] at discharge? Yes Is the patient willing to transition their outpatient pharmacy to utilize a [facility] outpatient pharmacy?   Pending    Assessment: 1. Acute on chronic systolic CHF (EF 56%), due to hypertropic cardiomyopathy, amyloid negative, pending R/LHC. NYHA class III symptoms. - Continue furosemide 40 mg IV daily, replace K - Continue carvedilol 25 mg BID - Holding PTA losartan and spironolactone for AKI - consider restarting losartan with improved SCr - Continue hydralazine 25 mg TID - Consider SGLT2i at discharge  Plan: 1) Medication changes recommended at this time: - Restart losartan  50 mg daily (half PTA dose) - Replace potassium  2) Patient assistance: - Farxiga copay $110.06 - Entresto copay $130.03  - Can see if pt qualifies for additional assistance with high copays  3)  Education  - To be completed prior to discharge  Kerby Nora, PharmD, BCPS Heart Failure Stewardship Pharmacist Phone [phone]

## 2021-08-08 NOTE — H&P (View-Only) (Signed)
° °Progress Note ° °Patient Name: Dylan [name] °Date of Encounter: 08/08/2021 ° °[facility] Cardiologist:  T O'Neal, MD  ° °Subjective  ° °Breathing back to baseline. Scr improving with diuresis. Cath likely tomorrow, but will review with MD.  ° °Inpatient Medications  °  °Scheduled Meds: ° allopurinol  100 mg Oral Daily  ° aspirin EC  81 mg Oral Daily  ° atorvastatin  40 mg Oral Daily  ° carvedilol  25 mg Oral BID WC  ° cycloSPORINE  1 drop Both Eyes BID  ° furosemide  40 mg Intravenous Daily  ° hydrALAZINE  25 mg Oral Q8H  ° insulin aspart  0-15 Units Subcutaneous TID WC  ° insulin glargine-yfgn  13 Units Subcutaneous QHS  ° mouth rinse  15 mL Mouth Rinse BID  ° potassium chloride  40 mEq Oral Daily  ° sodium chloride flush  3 mL Intravenous Q12H  ° °Continuous Infusions: ° sodium chloride Stopped (08/07/21 1557)  ° °PRN Meds: °sodium chloride, acetaminophen, albuterol, ondansetron (ZOFRAN) IV, sodium chloride flush  ° °Vital Signs  °  °Vitals:  ° 08/07/21 2011 08/08/21 0057 08/08/21 0400 08/08/21 0500  °BP: (!) 146/82 (!) 164/95 (!) 159/95   °Pulse: 80 81 76   °Resp: (!) 21 (!) 24 20   °Temp: 98.6 °F (37 °C) 98.2 °F (36.8 °C) 98.4 °F (36.9 °C)   °TempSrc: Oral Oral Oral   °SpO2: 100% 100% 100%   °Weight:    72.6 kg  °Height:      ° ° °Intake/Output Summary (Last 24 hours) at 08/08/2021 1039 °Last data filed at 08/08/2021 0700 °Gross per 24 hour  °Intake 429.2 ml  °Output 2150 ml  °Net -1720.8 ml  ° °Last 3 Weights 08/08/2021 08/07/2021 08/06/2021  °Weight (lbs) 160 lb 0.9 oz 162 lb 3.2 oz 165 lb 9.1 oz  °Weight (kg) 72.6 kg 73.573 kg 75.1 kg  °   ° °Telemetry  °  °Atrial fibrillation/flutter at controlled rate - Personally Reviewed ° °ECG  °  °N/A ° °Physical Exam  ° °GEN: No acute distress.   °Neck: No JVD °Cardiac: Irregular, no murmurs, rubs, or gallops.  °Respiratory: Clear to auscultation bilaterally. °GI: Soft, nontender, non-distended  °MS: No edema; No deformity. °Neuro:  Nonfocal  °Psych: Normal affect   ° °Labs  °  °High Sensitivity Troponin:   °Recent Labs  °Lab 08/03/21 °1501 08/03/21 °1930  °TROPONINIHS 91* 95*  °   °Chemistry °Recent Labs  °Lab 08/05/21 °0315 08/06/21 °0403 08/07/21 °0144 08/08/21 °0602  °NA 139 141 140 141  °K 3.1* 3.4* 3.7 3.4*  °CL 106 106 106 106  °CO2 21* 25 25 26  °GLUCOSE 255* 258* 294* 86  °BUN 51* 41* 34* 24*  °CREATININE 2.12* 2.08* 1.63* 1.39*  °CALCIUM 8.4* 8.2* 8.2* 8.6*  °MG 1.8  --   --   --   °GFRNONAA 31* 32* 43* 52*  °ANIONGAP 12 10 9 9  °  °Hematology °Recent Labs  °Lab 08/05/21 °0315 08/06/21 °0403 08/06/21 °0951 08/07/21 °0144  °WBC 9.3 9.7  --  9.1  °RBC 3.70* 3.35* 3.67* 3.45*  °HGB 11.7* 10.5*  --  10.7*  °HCT 35.8* 32.5*  --  33.6*  °MCV 96.8 97.0  --  97.4  °MCH 31.6 31.3  --  31.0  °MCHC 32.7 32.3  --  31.8  °RDW 13.4 13.5  --  13.3  °PLT 145* 136*  --  129*  ° °BNP °Recent Labs  °Lab   08/03/21 °2216  °BNP 3,479.4*  ° °Radiology  °  °No results found. ° °Cardiac Studies  ° °Echo 08/04/2021 ° 1. Left ventricular ejection fraction, by estimation, is 30 to 35%. The  °left ventricle has moderately decreased function. The left ventricle has  °no regional wall motion abnormalities. Left ventricular diastolic  °parameters are consistent with Grade II  °diastolic dysfunction (pseudonormalization). There is the interventricular  °septum is flattened in systole and diastole, consistent with right  °ventricular pressure and volume overload.  ° 2. Right ventricular systolic function is severely reduced. The right ventricular  °size is moderately enlarged. There is severely elevated pulmonary artery  °systolic pressure. The estimated right ventricular systolic pressure is  °60.7 mmHg.  ° 3. Left atrial size was severely dilated.  ° 4. Right atrial size was severely dilated.  ° 5. A small pericardial effusion is present. The pericardial effusion is  °circumferential.  ° 6. The mitral valve is normal in structure. Mild mitral valve  °regurgitation. No evidence of mitral stenosis.  °  7. Tricuspid valve regurgitation is severe.  ° 8. The aortic valve is normal in structure. There is moderate  °calcification of the aortic valve. There is mild thickening of the aortic  °valve. Aortic valve regurgitation is not visualized. Aortic valve  °sclerosis/calcification is present, without any  °evidence of aortic stenosis.  ° 9. The inferior vena cava is dilated in size with <50% respiratory  °variability, suggesting right atrial pressure of 15 mmHg °  °Outpatient stress test 07/25/2021 °Baseline EKG shows NSR with RBBB with repolarization abnormality. °  No ST deviation was noted from baseline EKG with infusion. Occasional PVCs and PACs were present. °  LV perfusion is normal. There is no evidence of ischemia. °  Left ventricular function is abnormal. Global function is severely reduced. Nuclear stress EF: 21 %. End diastolic cavity size is severely enlarged. End systolic cavity size is severely enlarged. No evidence of transient ischemic dilation (TID) noted. °  Findings are consistent with no prior ischemia. The study is high risk due to severe LV dysfunction. °  Compared to prior echo, EF has declined from 50% to 21%.  Consider repeat 2D echo. °  ° °Patient Profile  °   °75 y.o. male male with a hx of hypertrophic cardiomyopathy, chronic systolic heart failure, history of persistent atrial flutter/A. Fib not on anticoagulation therapy due to cost, CKD stage III, history of nocturnal complete heart block, hypertension, DM2 and OSA not on CPAP who is being seen for the evaluation of acute CHF exacerbation at the request of Dr. Narenda. °  °PYP scan on 03/09/2021 that was equivocal for TTR amyloidosis.   °Cardiac MRI on 03/14/2021 that showed EF 25%, 3% LGE in the basal septum and the RV insertion site, asymmetric LV hypertrophy measuring up to 21 mm in basal septum consistent with hypertrophic cardiomyopathy, small pericardial effusion adjacent to LV lateral wall. °Stress test 07/08/21: No ischemia   ° °Assessment & Plan  °  °Acute on chronic combined CHF, RV failure °- Echo this admission showed newly depressed LVEF at 30-35% from 50% in 12/2020. There is grade II DD and RVSP 60.7mm Hg. Recent outpatient stress test 07/25/2021 as hight risk due to low EF but no evidence of ischemia. Dr. C and Dr. Skains recommended R & L cath once better renal function.  °- Diuresed 9.8. Weight down 173>>160lb (seems baseline line weight of 160lb).  °- Appears euvolemic. Scr 1.39 today. Likely cath tomorrow. Will   review plan with MD  - Losartan and Spironolactone are on hold for due to elevated renal function >> resume when stable.  - Continue Coreg 71m BID - Could consider SGLT2 inhibitor but cost could be a issue - Prior cardiac MRI as above   2. Acute on CKD III - Scr was 1.21 on 03/2021 - Scr improving with diuresis. Scr peaked at 2.42, now improved to 1.39today.  - Follow closely   3. Persistent atrial flutter - Not on anticoagulation due to cost issues.  - Continue Coreg. Rate controlled.    4. Nocturnal bradycardia - Likely due to untreated OSA. No recent syncope. No indication for pacemaker currently.    5. DM - Per primary team    6. HTN - Continue Coreg 266mBID - Added hydralazine yesterday with improved BP but still elevated> > will titrate to 5067mID (will help with afterload reduction as well while holding losartan & spiro)   For questions or updates, please contact [facility] consult [email or web address] for contact info under        Signed, BLeanor KailA  08/08/2021, 10:39 AM     Attending Note:   The patient was seen and examined.  Agree with assessment and plan as noted above.  Changes made to the above note as needed.  Patient seen and independently examined with Vin Bhagat, Pa .   We discussed all aspects of the encounter. I agree with the assessment and plan as stated above.    Acute on chronic CHF :   , RV failure.   Agree with plans for cath tomorrow We  have discussed the risks, benefits, options.  He understands and agrees to proceed.   Will schedule for tomorrow   EF is 30-35% Needs additional BP lowering . Will add Imdur. Avoid amlodipine for now given low EF .   2.  CKD:  creatinine has improved to the point where it should be safe for him to have a cath   3.   HTN:   he is on hydralazine.  Add Imdur.      I have spent a total of 40 minutes with patient reviewing hospital  notes , telemetry, EKGs, labs and examining patient as well as establishing an assessment and plan that was discussed with the patient.  > 50% of time was spent in direct patient care.    PhiThayer Headingsr.Brooke BonitoMD, FACOverton Brooks Va Medical Center1/2023, 12:14 PM [id number] [phone]

## 2021-08-09 ENCOUNTER — Inpatient Hospital Stay (HOSPITAL_COMMUNITY)
Admission: EM | Disposition: A | Discharge status: Home Health | Payer: Self-pay | Source: Home / Self Care | Attending: Internal Medicine

## 2021-08-09 ENCOUNTER — Encounter (HOSPITAL_COMMUNITY): Payer: Self-pay | Admitting: Cardiovascular Disease

## 2021-08-09 ENCOUNTER — Ambulatory Visit (HOSPITAL_COMMUNITY): Admit: 2021-08-09 | Payer: Medicare HMO | Admitting: Cardiovascular Disease

## 2021-08-09 DIAGNOSIS — I421 Obstructive hypertrophic cardiomyopathy: Secondary | ICD-10-CM

## 2021-08-09 DIAGNOSIS — I2510 Atherosclerotic heart disease of native coronary artery without angina pectoris: Secondary | ICD-10-CM

## 2021-08-09 HISTORY — PX: RIGHT/LEFT HEART CATH AND CORONARY ANGIOGRAPHY: CATH118266

## 2021-08-09 LAB — BASIC METABOLIC PANEL
Anion gap: 10 (ref 5–15)
BUN: 27 mg/dL — ABNORMAL HIGH (ref 8–23)
CO2: 25 mmol/L (ref 22–32)
Calcium: 8.8 mg/dL — ABNORMAL LOW (ref 8.9–10.3)
Chloride: 104 mmol/L (ref 98–111)
Creatinine, Ser: 1.57 mg/dL — ABNORMAL HIGH (ref 0.61–1.24)
GFR, Estimated: 45 mL/min — ABNORMAL LOW (ref >60)
Glucose, Bld: 111 mg/dL — ABNORMAL HIGH (ref 70–99)
Potassium: 3.8 mmol/L (ref 3.5–5.1)
Sodium: 139 mmol/L (ref 135–145)

## 2021-08-09 LAB — POCT I-STAT EG7
Acid-Base Excess: 3 mmol/L — ABNORMAL HIGH (ref 0.0–2.0)
Acid-Base Excess: 3 mmol/L — ABNORMAL HIGH (ref 0.0–2.0)
Bicarbonate: 27.6 mmol/L (ref 20.0–28.0)
Bicarbonate: 28.3 mmol/L — ABNORMAL HIGH (ref 20.0–28.0)
Calcium, Ion: 1.22 mmol/L (ref 1.15–1.40)
Calcium, Ion: 1.22 mmol/L (ref 1.15–1.40)
HCT: 33 % — ABNORMAL LOW (ref 39.0–52.0)
HCT: 33 % — ABNORMAL LOW (ref 39.0–52.0)
Hemoglobin: 11.2 g/dL — ABNORMAL LOW (ref 13.0–17.0)
Hemoglobin: 11.2 g/dL — ABNORMAL LOW (ref 13.0–17.0)
O2 Saturation: 54 %
O2 Saturation: 55 %
Potassium: 3.7 mmol/L (ref 3.5–5.1)
Potassium: 3.7 mmol/L (ref 3.5–5.1)
Sodium: 142 mmol/L (ref 135–145)
Sodium: 143 mmol/L (ref 135–145)
TCO2: 29 mmol/L (ref 22–32)
TCO2: 30 mmol/L (ref 22–32)
pCO2, Ven: 42.9 mmHg — ABNORMAL LOW (ref 44.0–60.0)
pCO2, Ven: 44.3 mmHg (ref 44.0–60.0)
pH, Ven: 7.413 (ref 7.250–7.430)
pH, Ven: 7.416 (ref 7.250–7.430)
pO2, Ven: 28 mmHg — CL (ref 32.0–45.0)
pO2, Ven: 29 mmHg — CL (ref 32.0–45.0)

## 2021-08-09 LAB — POCT I-STAT 7, (LYTES, BLD GAS, ICA,H+H)
Acid-Base Excess: 3 mmol/L — ABNORMAL HIGH (ref 0.0–2.0)
Bicarbonate: 27.2 mmol/L (ref 20.0–28.0)
Calcium, Ion: 1.17 mmol/L (ref 1.15–1.40)
HCT: 33 % — ABNORMAL LOW (ref 39.0–52.0)
Hemoglobin: 11.2 g/dL — ABNORMAL LOW (ref 13.0–17.0)
O2 Saturation: 91 %
Potassium: 3.7 mmol/L (ref 3.5–5.1)
Sodium: 143 mmol/L (ref 135–145)
TCO2: 28 mmol/L (ref 22–32)
pCO2 arterial: 37.9 mmHg (ref 32.0–48.0)
pH, Arterial: 7.464 — ABNORMAL HIGH (ref 7.350–7.450)
pO2, Arterial: 57 mmHg — ABNORMAL LOW (ref 83.0–108.0)

## 2021-08-09 LAB — GLUCOSE, CAPILLARY
Glucose-Capillary: 101 mg/dL — ABNORMAL HIGH (ref 70–99)
Glucose-Capillary: 81 mg/dL (ref 70–99)
Glucose-Capillary: 254 mg/dL — ABNORMAL HIGH (ref 70–99)
Glucose-Capillary: 180 mg/dL — ABNORMAL HIGH (ref 70–99)

## 2021-08-09 LAB — CBC
HCT: 33.3 % — ABNORMAL LOW (ref 39.0–52.0)
Hemoglobin: 10.6 g/dL — ABNORMAL LOW (ref 13.0–17.0)
MCH: 30.7 pg (ref 26.0–34.0)
MCHC: 31.8 g/dL (ref 30.0–36.0)
MCV: 96.5 fL (ref 80.0–100.0)
Platelets: 141 10*3/uL — ABNORMAL LOW (ref 150–400)
RBC: 3.45 MIL/uL — ABNORMAL LOW (ref 4.22–5.81)
RDW: 13.2 % (ref 11.5–15.5)
WBC: 9.1 10*3/uL (ref 4.0–10.5)
nRBC: 0.6 % — ABNORMAL HIGH (ref 0.0–0.2)

## 2021-08-09 LAB — POCT ACTIVATED CLOTTING TIME: Activated Clotting Time: 179 seconds

## 2021-08-09 SURGERY — RIGHT/LEFT HEART CATH AND CORONARY ANGIOGRAPHY
Anesthesia: LOCAL

## 2021-08-09 MED ORDER — VERAPAMIL HCL 2.5 MG/ML IV SOLN
INTRAVENOUS | Status: AC
  Filled 2021-08-09: qty 2

## 2021-08-09 MED ORDER — ISOSORBIDE MONONITRATE ER 30 MG PO TB24
30.0000 mg | ORAL_TABLET | Freq: Once | ORAL | Status: AC
  Administered 2021-08-09: 30 mg via ORAL
  Filled 2021-08-09: qty 1

## 2021-08-09 MED ORDER — ASPIRIN 81 MG PO CHEW: 81.0000 mg | CHEWABLE_TABLET | Freq: Every day | ORAL | Status: DC

## 2021-08-09 MED ORDER — HEPARIN SODIUM (PORCINE) 5000 UNIT/ML IJ SOLN
5000.0000 [IU] | Freq: Three times a day (TID) | INTRAMUSCULAR | Status: DC
  Administered 2021-08-09 – 2021-08-10 (×2): 5000 [IU] via SUBCUTANEOUS
  Filled 2021-08-09 (×2): qty 1

## 2021-08-09 MED ORDER — HYDRALAZINE HCL 20 MG/ML IJ SOLN
10.0000 mg | Freq: Once | INTRAMUSCULAR | Status: AC
  Administered 2021-08-09: 10 mg via INTRAVENOUS

## 2021-08-09 MED ORDER — HEPARIN (PORCINE) IN NACL 1000-0.9 UT/500ML-% IV SOLN
INTRAVENOUS | Status: AC
  Filled 2021-08-09: qty 1000

## 2021-08-09 MED ORDER — HEPARIN SODIUM (PORCINE) 1000 UNIT/ML IJ SOLN
INTRAMUSCULAR | Status: DC | PRN
  Administered 2021-08-09: 3600 [IU] via INTRAVENOUS

## 2021-08-09 MED ORDER — SODIUM CHLORIDE 0.9 % IV SOLN: 250.0000 mL | INTRAVENOUS | Status: DC | PRN

## 2021-08-09 MED ORDER — FENTANYL CITRATE (PF) 100 MCG/2ML IJ SOLN
INTRAMUSCULAR | Status: AC
  Filled 2021-08-09: qty 2

## 2021-08-09 MED ORDER — HEPARIN (PORCINE) IN NACL 1000-0.9 UT/500ML-% IV SOLN
INTRAVENOUS | Status: DC | PRN
  Administered 2021-08-09 (×2): 500 mL

## 2021-08-09 MED ORDER — HEPARIN SODIUM (PORCINE) 1000 UNIT/ML IJ SOLN
INTRAMUSCULAR | Status: AC
  Filled 2021-08-09: qty 10

## 2021-08-09 MED ORDER — FENTANYL CITRATE (PF) 100 MCG/2ML IJ SOLN
INTRAMUSCULAR | Status: DC | PRN
  Administered 2021-08-09: 25 ug via INTRAVENOUS

## 2021-08-09 MED ORDER — HYDRALAZINE HCL 20 MG/ML IJ SOLN: 10.0000 mg | INTRAMUSCULAR | Status: AC | PRN

## 2021-08-09 MED ORDER — LIDOCAINE HCL (PF) 1 % IJ SOLN
INTRAMUSCULAR | Status: AC
  Filled 2021-08-09: qty 30

## 2021-08-09 MED ORDER — LIDOCAINE HCL (PF) 1 % IJ SOLN
INTRAMUSCULAR | Status: DC | PRN
  Administered 2021-08-09 (×2): 2 mL
  Administered 2021-08-09: 10 mL

## 2021-08-09 MED ORDER — ONDANSETRON HCL 4 MG/2ML IJ SOLN: 4.0000 mg | Freq: Four times a day (QID) | INTRAMUSCULAR | Status: DC | PRN

## 2021-08-09 MED ORDER — SODIUM CHLORIDE 0.9 % IV SOLN: INTRAVENOUS | Status: AC

## 2021-08-09 MED ORDER — HYDRALAZINE HCL 20 MG/ML IJ SOLN
INTRAMUSCULAR | Status: AC
  Filled 2021-08-09: qty 1

## 2021-08-09 MED ORDER — MIDAZOLAM HCL 2 MG/2ML IJ SOLN
INTRAMUSCULAR | Status: DC | PRN
  Administered 2021-08-09: 1 mg via INTRAVENOUS

## 2021-08-09 MED ORDER — SODIUM CHLORIDE 0.9% FLUSH: 3.0000 mL | INTRAVENOUS | Status: DC | PRN

## 2021-08-09 MED ORDER — INSULIN ASPART 100 UNIT/ML IJ SOLN
2.0000 [IU] | Freq: Three times a day (TID) | INTRAMUSCULAR | Status: DC
  Administered 2021-08-09 – 2021-08-10 (×3): 2 [IU] via SUBCUTANEOUS

## 2021-08-09 MED ORDER — IOHEXOL 350 MG/ML SOLN
INTRAVENOUS | Status: DC | PRN
  Administered 2021-08-09: 60 mL

## 2021-08-09 MED ORDER — VERAPAMIL HCL 2.5 MG/ML IV SOLN
INTRAVENOUS | Status: DC | PRN
  Administered 2021-08-09: 10 mL via INTRA_ARTERIAL

## 2021-08-09 MED ORDER — LABETALOL HCL 5 MG/ML IV SOLN: 10.0000 mg | INTRAVENOUS | Status: AC | PRN

## 2021-08-09 MED ORDER — MIDAZOLAM HCL 2 MG/2ML IJ SOLN
INTRAMUSCULAR | Status: AC
  Filled 2021-08-09: qty 2

## 2021-08-09 MED ORDER — ACETAMINOPHEN 325 MG PO TABS: 650.0000 mg | ORAL_TABLET | ORAL | Status: DC | PRN

## 2021-08-09 MED ORDER — SODIUM CHLORIDE 0.9% FLUSH
3.0000 mL | Freq: Two times a day (BID) | INTRAVENOUS | Status: DC
  Administered 2021-08-09 – 2021-08-10 (×3): 3 mL via INTRAVENOUS

## 2021-08-09 MED ORDER — ISOSORBIDE MONONITRATE ER 60 MG PO TB24
60.0000 mg | ORAL_TABLET | Freq: Every day | ORAL | Status: DC
  Administered 2021-08-10: 60 mg via ORAL
  Filled 2021-08-09: qty 1

## 2021-08-09 SURGICAL SUPPLY — 18 items
CATH BALLN WEDGE 5F 110CM (CATHETERS) ×1 IMPLANT
CATH LAUNCHER 5F JR4 (CATHETERS) ×1 IMPLANT
CATH OPTITORQUE TIG 4.0 5F (CATHETERS) ×1 IMPLANT
CATH SWAN GANZ 7F STRAIGHT (CATHETERS) ×1 IMPLANT
DEVICE RAD COMP TR BAND LRG (VASCULAR PRODUCTS) ×1 IMPLANT
GLIDESHEATH SLEND SS 6F .021 (SHEATH) ×1 IMPLANT
GUIDEWIRE .025 260CM (WIRE) ×1 IMPLANT
GUIDEWIRE INQWIRE 1.5J.035X260 (WIRE) IMPLANT
INQWIRE 1.5J .035X260CM (WIRE) ×2
KIT HEART LEFT (KITS) ×2 IMPLANT
PACK CARDIAC CATHETERIZATION (CUSTOM PROCEDURE TRAY) ×2 IMPLANT
SHEATH GLIDE SLENDER 4/5FR (SHEATH) ×1 IMPLANT
SHEATH PINNACLE 5F 10CM (SHEATH) ×1 IMPLANT
SHEATH PINNACLE 7F 10CM (SHEATH) ×1 IMPLANT
SHEATH PROBE COVER 6X72 (BAG) ×1 IMPLANT
TRANSDUCER W/STOPCOCK (MISCELLANEOUS) ×2 IMPLANT
TUBING CIL FLEX 10 FLL-RA (TUBING) ×2 IMPLANT
WIRE EMERALD 3MM-J .025X260CM (WIRE) ×1 IMPLANT

## 2021-08-09 NOTE — Progress Notes (Signed)
Site area- right  Site Prior to Removal- 0   Pressure Applied For-  15 MInutes   Bedrest Beginning at - 1115   Manual- Yes   Patient Status During Pull- Stable    Post Pull Groin Site- 0   Post Pull Instructions Given- Yes   Post Pull Pulses Present- Yes    Dressing Applied- Tegaderm and Gauze Dressing    Comments:

## 2021-08-09 NOTE — Progress Notes (Signed)
PT Cancellation Note  Patient Details Name: Dylan [name] MRN: [id number] DOB: 1946-05-02   Cancelled Treatment:    Reason Eval/Treat Not Completed: Fatigue/lethargy limiting ability to participate at this time. Pt requesting PT follow up in morning. When pressed, pt reports some fatigue following procedure today but reports no pain or other needs. This is the pt's 3rd day in a row of declining all PT and mobility attempts, will follow up tomorrow and attempt to progress the pt as he states his goal is to return. If pt continues to refuse PT sessions, may need to sign off per dept protocol until the pt is more willing to participate in therapy.   West Carbo, PT, DPT   Acute Rehabilitation Department Pager #: [phone]   Sandra Cockayne 08/09/2021, 5:57 PM

## 2021-08-09 NOTE — Progress Notes (Signed)
Heart Failure Nurse Navigator Progress Note  Navigation team following this hospitalization. R/L HC sch for 08/09/21. H&V TOC appt has been sch for 08/15/21, will continue to follow and can move appt out further if neede, based on d/c date. HF Pharmacy Steward following for ? Assistance with high copays.  Kevan Rosebush, RN, BSN, [facility] HF Navigator//Specialty Coordinator Advanced Heart Failure Clinic

## 2021-08-09 NOTE — Interval H&P Note (Signed)
History and Physical Interval Note:  08/09/2021 9:21 AM  Canary Brim  has presented today for surgery, with the diagnosis of acute onchronic combined CHF.  The various methods of treatment have been discussed with the patient and family. After consideration of risks, benefits and other options for treatment, the patient has consented to  Procedure(s): RIGHT/LEFT HEART CATH AND CORONARY ANGIOGRAPHY (N/A) as a surgical intervention.  The patient's history has been reviewed, patient examined, no change in status, stable for surgery.  I have reviewed the patient's chart and labs.  Questions were answered to the patient's satisfaction.     Shelva Majestic

## 2021-08-09 NOTE — Progress Notes (Signed)
Heart Failure Stewardship Pharmacist Progress Note   PCP: Angelica Pou, MD PCP-Cardiologist: Evalina Field, MD    HPI:  76 yo M with PMH of CHF, aflutter/afib, CKD III, nocturnal complete heart block, HTN, T2DM, and OSA. He presented to the ED on 1/27 with worsening shortness of breath, abdominal distention, orthopnea, weight gain, and cough. CXR with mild pulmonary vascular congestion. An ECHO was done on 1/29 and LVEF is 30-35% (21% on stress test). Amyloid test in 03/2021 was negative. R/LHC today with single vessel disease and moderate pulmonary hypertension.   Current HF Medications: Beta Blocker: carvedilol 25 mg BID Other: hydralazine 50 mg q8h, Imdur 60 mg daily  Prior to admission HF Medications: Beta blocker: carvedilol 25 mg BID ACE/ARB/ARNI: losartan 100 mg daily Aldosterone Antagonist: spironolactone 25 mg daily  Pertinent Lab Values: Serum creatinine 1.57, BUN 27, Potassium 3.8, Sodium 139, BNP 3479.4, Magnesium 1.8, A1c 7.5   Vital Signs: Weight: 160 lbs (admission weight: 173 lbs) Blood pressure: 140/90s  Heart rate: 60s  I/O: -1.3L yesterday; net -11.6L  Medication Assistance / Insurance Benefits Check: Does the patient have prescription insurance?  Yes Type of insurance plan: [place] [employer]  Outpatient Pharmacy:  Prior to admission outpatient pharmacy: [facility] Is the patient willing to use [facility] at discharge? Yes Is the patient willing to transition their outpatient pharmacy to utilize a [facility] outpatient pharmacy?   Pending    Assessment: 1. Acute on chronic systolic CHF (EF 56%), due to NICM. Hypertropic cardiomyopathy, amyloid negative. NYHA class III symptoms. - Off IV lasix  - Continue carvedilol 25 mg BID - Holding PTA losartan and spironolactone for AKI - SCr worse today and with cath - hold off resuming for now - Agree with increasing to hydralazine 50 mg TID - Agree with adding Imdur 60 mg daily - Consider SGLT2i at  discharge  Plan: 1) Medication changes recommended at this time: - Agree with changes as above  2) Patient assistance: - Farxiga copay $110.06 Delene Loll copay $130.03  - Can see if pt qualifies for additional assistance with high copays  3)  Education  - To be completed prior to discharge  Kerby Nora, PharmD, BCPS Heart Failure Stewardship Pharmacist Phone [phone]

## 2021-08-09 NOTE — Progress Notes (Signed)
1105- Sheath removed from R. Groin area, no bleeding noted at site.

## 2021-08-09 NOTE — Progress Notes (Signed)
Pt had sheath removed at 1105 08/09/2021; site assessed no redness, swelling, bleeding or pain. Dressing clean, dry and intact.  TRB band site assessed no redness, swelling, bleeding or pain. Dressing clean, dry and intact.

## 2021-08-09 NOTE — Progress Notes (Signed)
MD in room and stated "OK to place Pt on humidified O2" humidified O2 connected. Pt at 4 L O2. Sat 94%

## 2021-08-09 NOTE — Progress Notes (Signed)
Patient ID: LEGRANDE HAO, male   DOB: 1946-01-31, 76 y.o.   MRN: [id number]  TR band removed and site covered with gauze and Tegaderm. Pulses normal, no c/o pain, no swelling or bruising noted.  Haydee Salter, RN

## 2021-08-09 NOTE — Progress Notes (Incomplete)
Subjective: No acute overnight events.   Patient was seen at bedside s/p R/L heart cath. He reports feeling well. Denies SHOB and chest pain. No questions or concerns.   Pt is updated on the plan for today.   Objective:  Vital signs in last 24 hours: Vitals:   08/09/21 1158 08/09/21 1300 08/09/21 1400 08/09/21 1416  BP: (!) 143/93 (!) 151/84 134/64   Pulse: 64 63 71   Resp: 19 20    Temp:  98 F (36.7 C) 98.9 F (37.2 C)   TempSrc:  Oral Oral   SpO2: 96% 97% 93% 94%  Weight:      Height:        Constitutional: alert, well-appearing, in NAD Neck: supple, no JVD  Cardiovascular: RRR, no m/r/g, trace bilateral LE edema  Pulmonary/Chest: normal work of breathing on room air, LCTAB Abdominal: soft, non-tender to palpation, non-distended Neurological: A&O x 3 Skin: warm and dry  Assessment/Plan:  Principal Problem:   Acute exacerbation of CHF (congestive heart failure) ([name]) Active Problems:   DM (diabetes mellitus), type 2 with renal complications ([name])   CKD (chronic kidney disease) stage 3, GFR 30-59 ml/min ([name])   Complete heart block by electrocardiogram ([place]), nocturnal   Hypertrophic obstructive cardiomyopathy ([name])  Dylan [name] a 76 y.o. person living with HTN, T2DM ,hypertrophic cardiomyopathy, heart failure with mildly reduced ejection fraction., paroxysmal atrial fibrillation, CKD stage IIIa, admitted for acute on chronic HFrEF exacerbation.  BP- inc hydral and inc imdur to 60 Cath  Holding Eliquis  I/O- put out Cr 1.4 -> 1.57 ->  Mealtime cbgs 240's- novolog 2 units? Fasting 110's.   #AoC HFrEF exacerbation #Nonischemic hypertrophic cardiomyopathy #Nocturnal complete heart block, pacemaker not indicated  #HTN Continues to diurese well; put out out 1.7L yesterday with net 11.5L, close to his dry weight. S/p R & L heart cath today. BP remains elevated despite up-titration of hydral and addition of Imdur.  - Appreciate cardiology recommendations -  Trend BMP  - Continue Lasix 40 mg IV  - Continue Coreg 25 mg p.o. BID - Continue hydral 50 mg tid  - Imdur increased to 60 by cardiology  - Continue holding losartan and spironolactone due to elevated renal function, can likely resume tomorrow pending renal function.  - Consider SGLT2i at d/c, but cost is barrier   #AKI on CKD stage IIIa #Hypokalemia Suspect from cardiorenal. Baseline 1.4. Bump in Cr from 1.4 -> 1.57 today.  -Continue IV Lasix -Continue holding losartan and Aldactone and resume once stable  -Trend BMP    #Paroxysmal A. fib fib/a flutter Inconsistent use of Xarelto d/t cost. Eliquis was started during this admission, and TOC consulted to help pt access medication (pt can get a free 30 day supply for now).  -Continue rate control with home Coreg -Holding Eliquis d/t cath procedure  -Appreciate SW assistance   #Rectal bleeding #Iron deficiency Follows Dr. Benson [name] with GI for his rectal bleeding, thought 2/2 diverticular bleed. 2020 colonoscopy showed diverticulosis otherwise unremarkable. Iron studies consistent with deficiency, s/p IV iron repletion here.  - Holding Eliquis 5 mg po BID prior to cath  - Trend CBC    #T2DM Last A1c 7.5. Mealtime CBG's mid-200's, and fasting 110's.  -13 units of Semglee - Added Novolog 2U with meals, for mealtime coverage  -SSI -Appreciate diabetic coordinator assistance   Diet: Heart Healthy IVF: None,None VTE: SCD Code: Full   Dylan Manes, MD  Internal Medicine Resident, PGY-1 Pager: [phone] After 5pm  on weekdays and 1pm on weekends: On Call pager [phone]

## 2021-08-09 NOTE — Interval H&P Note (Signed)
History and Physical Interval Note:  08/09/2021 9:22 AM  Dylan [name]  has presented today for surgery, with the diagnosis of acute onchronic combined CHF.  TheCath Lab Visit (complete for each Cath Lab visit)  Clinical Evaluation Leading to the Procedure:   ACS: No.  Non-ACS:    Anginal Classification: CCS II  Anti-ischemic medical therapy: Minimal Therapy (1 class of medications)  Non-Invasive Test Results: High-risk stress test findings: cardiac mortality >3%/year  Prior CABG: No previous CABG       various methods of treatment have been discussed with the patient and family. After consideration of risks, benefits and other options for treatment, the patient has consented to  Procedure(s): RIGHT/LEFT HEART CATH AND CORONARY ANGIOGRAPHY (N/A) as a surgical intervention.  The patient's history has been reviewed, patient examined, no change in status, stable for surgery.  I have reviewed the patient's chart and labs.  Questions were answered to the patient's satisfaction.     Shelva Majestic

## 2021-08-09 NOTE — Progress Notes (Signed)
OT Cancellation Note  Patient Details Name: Dylan [name] MRN: [id number] DOB: [birth year]   Cancelled Treatment:    Reason Eval/Treat Not Completed: Patient at procedure or test/ unavailable (Cath Lab). OT will continue to follow acutely  Jaci Carrel 08/09/2021, 9:05 AM  Jesse Sans OTR/L Acute Rehabilitation Services Pager: [phone] Office: [phone]

## 2021-08-10 ENCOUNTER — Telehealth: Payer: Self-pay | Admitting: Internal Medicine

## 2021-08-10 ENCOUNTER — Other Ambulatory Visit (HOSPITAL_COMMUNITY): Payer: Self-pay

## 2021-08-10 LAB — CBC
HCT: 34.8 % — ABNORMAL LOW (ref 39.0–52.0)
Hemoglobin: 11.3 g/dL — ABNORMAL LOW (ref 13.0–17.0)
MCH: 31.2 pg (ref 26.0–34.0)
MCHC: 32.5 g/dL (ref 30.0–36.0)
MCV: 96.1 fL (ref 80.0–100.0)
Platelets: 141 10*3/uL — ABNORMAL LOW (ref 150–400)
RBC: 3.62 MIL/uL — ABNORMAL LOW (ref 4.22–5.81)
RDW: 13.4 % (ref 11.5–15.5)
WBC: 9 10*3/uL (ref 4.0–10.5)
nRBC: 0.4 % — ABNORMAL HIGH (ref 0.0–0.2)

## 2021-08-10 LAB — GLUCOSE, CAPILLARY
Glucose-Capillary: 129 mg/dL — ABNORMAL HIGH (ref 70–99)
Glucose-Capillary: 112 mg/dL — ABNORMAL HIGH (ref 70–99)

## 2021-08-10 LAB — BASIC METABOLIC PANEL
Anion gap: 10 (ref 5–15)
BUN: 26 mg/dL — ABNORMAL HIGH (ref 8–23)
CO2: 24 mmol/L (ref 22–32)
Calcium: 9 mg/dL (ref 8.9–10.3)
Chloride: 106 mmol/L (ref 98–111)
Creatinine, Ser: 1.57 mg/dL — ABNORMAL HIGH (ref 0.61–1.24)
GFR, Estimated: 45 mL/min — ABNORMAL LOW (ref >60)
Glucose, Bld: 140 mg/dL — ABNORMAL HIGH (ref 70–99)
Potassium: 3.9 mmol/L (ref 3.5–5.1)
Sodium: 140 mmol/L (ref 135–145)

## 2021-08-10 MED ORDER — HYDRALAZINE HCL 50 MG PO TABS
50.0000 mg | ORAL_TABLET | Freq: Three times a day (TID) | ORAL | 0 refills | Status: DC
  Filled 2021-08-10: qty 30, 10d supply, fill #0

## 2021-08-10 MED ORDER — FUROSEMIDE 40 MG PO TABS
40.0000 mg | ORAL_TABLET | Freq: Every day | ORAL | 0 refills | Status: DC
  Filled 2021-08-10: qty 30, 30d supply, fill #0

## 2021-08-10 MED ORDER — EPLERENONE 25 MG PO TABS
25.0000 mg | ORAL_TABLET | Freq: Every day | ORAL | Status: DC
  Filled 2021-08-10: qty 1

## 2021-08-10 MED ORDER — LOSARTAN POTASSIUM 50 MG PO TABS
100.0000 mg | ORAL_TABLET | Freq: Every day | ORAL | Status: DC
  Administered 2021-08-10: 100 mg via ORAL
  Filled 2021-08-10: qty 2

## 2021-08-10 MED ORDER — ISOSORBIDE MONONITRATE ER 60 MG PO TB24
60.0000 mg | ORAL_TABLET | Freq: Every day | ORAL | 0 refills | Status: DC
  Filled 2021-08-10: qty 30, 30d supply, fill #0

## 2021-08-10 NOTE — Progress Notes (Signed)
Physical Therapy Treatment Patient Details Name: Dylan [name] MRN: [id number] DOB: [birth year] Today's Date: 08/10/2021   History of Present Illness 76 year old man admitted on 08/03/21 with shortness of breath due to CHF exacerbation. Pt also + for AKI. PMH: hypertrophic cardiomyopathy, PAF, CHF with EF of 25%, PAF-non compliant with Eliquis, CKD3, HTN, DM2.    PT Comments    The pt was eager to participate in PT session this morning, was able to demo great progress with hallway ambulation, activity tolerance, and completed entire session on RA with SpO2 > 90%. The pt was challenged by speed intervals (10-72f bouts) with significant change in pace as well as completing series of step-ups on stairs without need for seated rest break or O2. The pt was also able to ambulate short distance in the room without need for UE support, is overall making great progress with mobility and remains safe for d/c home with HHPT when medically cleared for d/c.    Gait Speed: 0.5 m/s using rollator and with RA. (Gait speed <0.681m indicates increased risk of falls and dependence in ADLs)   Recommendations for follow up therapy are one component of a multi-disciplinary discharge planning process, led by the attending physician.  Recommendations may be updated based on patient status, additional functional criteria and insurance authorization.  Follow Up Recommendations  Home health PT     Assistance Recommended at Discharge Intermittent Supervision/Assistance  Patient can return home with the following Assistance with cooking/housework;Assist for transportation;Help with stairs or ramp for entrance   Equipment Recommendations  None recommended by PT (pt has rollator)    Recommendations for Other Services       Precautions / Restrictions Precautions Precautions: Fall Precaution Comments: denies any falls x 4 months Restrictions Weight Bearing Restrictions: No     Mobility  Bed Mobility Overal bed  mobility: Modified Independent             General bed mobility comments: Sitting EOB upon arrival with NT in room    Transfers Overall transfer level: Needs assistance Equipment used: Rollator (4 wheels) Transfers: Sit to/from Stand Sit to Stand: Supervision           General transfer comment: Supervision for safety    Ambulation/Gait Ambulation/Gait assistance: Min guard Gait Distance (Feet): 250 Feet Assistive device: Rollator (4 wheels) Gait Pattern/deviations: Step-through pattern, Decreased stride length, Shuffle, Trunk flexed Gait velocity: 0.5 m/s self-selected (improved from 0.3662m with increase to 0.91 m/s Gait velocity interpretation: 1.31 - 2.62 ft/sec, indicative of limited community ambulator   General Gait Details: pt with trunk flexed initially but responds well to cues. able to complete speed intervals with good change in speed. good stability with 4-wheel walker   Stairs Stairs: Yes Stairs assistance: Supervision Stair Management: One rail Right, Step to pattern, Forwards Number of Stairs: 4 (x2) General stair comments: RLE leading VSS       Balance Overall balance assessment: Needs assistance   Sitting balance-Leahy Scale: Good     Standing balance support: Bilateral upper extremity supported, No upper extremity supported, During functional activity Standing balance-Leahy Scale: Fair Standing balance comment: reliant on RW for ambulation, fair static balance                            Cognition Arousal/Alertness: Awake/alert Behavior During Therapy: WFL for tasks assessed/performed Overall Cognitive Status: Within Functional Limits for tasks assessed  General Comments: pt following all cues well and with good insight to safety        Exercises      General Comments General comments (skin integrity, edema, etc.): SpO2 84-86% with pt supine in bed on RA upon arrival of PT.  improved to 90% on RA and maintained in 90s on RA with mobility      Pertinent Vitals/Pain Pain Assessment Pain Assessment: No/denies pain Pain Intervention(s): Monitored during session     PT Goals (current goals can now be found in the care plan section) Acute Rehab PT Goals Patient Stated Goal: return home and to full independence PT Goal Formulation: With patient Time For Goal Achievement: 08/18/21 Potential to Achieve Goals: Good Progress towards PT goals: Progressing toward goals    Frequency    Min 3X/week      PT Plan Current plan remains appropriate       AM-PAC PT "6 Clicks" Mobility   Outcome Measure  Help needed turning from your back to your side while in a flat bed without using bedrails?: None Help needed moving from lying on your back to sitting on the side of a flat bed without using bedrails?: None Help needed moving to and from a bed to a chair (including a wheelchair)?: A Little Help needed standing up from a chair using your arms (e.g., wheelchair or bedside chair)?: A Little Help needed to walk in hospital room?: A Little Help needed climbing 3-5 steps with a railing? : A Little 6 Click Score: 20    End of Session Equipment Utilized During Treatment: Gait belt Activity Tolerance: Patient tolerated treatment well Patient left: in chair;with call bell/phone within reach;with chair alarm set;with nursing/sitter in room Nurse Communication: Mobility status PT Visit Diagnosis: Unsteadiness on feet (R26.81);Other abnormalities of gait and mobility (R26.89);Muscle weakness (generalized) (M62.81)     Time: 0623-7628 PT Time Calculation (min) (ACUTE ONLY): 24 min  Charges:  $Therapeutic Exercise: 23-37 mins                     West Carbo, PT, DPT   Acute Rehabilitation Department Pager #: [phone]   Sandra Cockayne 08/10/2021, 9:15 AM

## 2021-08-10 NOTE — Care Management Important Message (Signed)
Important Message  Patient Details  Name: Dylan [name] MRN: [id number] Date of Birth: [birth year]   Medicare Important Message Given:  Yes     Shelda Altes 08/10/2021, 8:34 AM

## 2021-08-10 NOTE — Progress Notes (Addendum)
Occupational Therapy Treatment Patient Details Name: Dylan [name] MRN: [id number] DOB: 1945-12-02 Today's Date: 08/10/2021   History of present illness 76 year old man admitted on 08/03/21 with shortness of breath due to CHF exacerbation. Pt also + for AKI. PMH: hypertrophic cardiomyopathy, PAF, CHF with EF of 25%, PAF-non compliant with Eliquis, CKD3, HTN, DM2.   OT comments  Pt agreeable to OT session, eager to engage in grooming tasks at sink.  Completing transfers, in room mobility using RW and grooming with supervision.  Improving tolerance and completing on RA with VSS.  Initiated energy conservation techniques and education. Will follow acutely.    Recommendations for follow up therapy are one component of a multi-disciplinary discharge planning process, led by the attending physician.  Recommendations may be updated based on patient status, additional functional criteria and insurance authorization.    Follow Up Recommendations  Home health OT    Assistance Recommended at Discharge Intermittent Supervision/Assistance  Patient can return home with the following  Assist for transportation;Help with stairs or ramp for entrance   Equipment Recommendations  None recommended by OT    Recommendations for Other Services      Precautions / Restrictions Precautions Precautions: Fall Precaution Comments: denies any falls x 4 months Restrictions Weight Bearing Restrictions: No       Mobility Bed Mobility               General bed mobility comments: OOB upon entry in recliner    Transfers                         Balance Overall balance assessment: Needs assistance   Sitting balance-Leahy Scale: Good     Standing balance support: Bilateral upper extremity supported, No upper extremity supported, During functional activity Standing balance-Leahy Scale: Fair Standing balance comment: ADLs at sink with supervision 0-1 hand support, B UE support dynamically during  mobility                           ADL either performed or assessed with clinical judgement   ADL Overall ADL's : Needs assistance/impaired     Grooming: Supervision/safety;Wash/dry hands;Wash/dry face;Oral care;Standing                   Toilet Transfer: Supervision/safety;Ambulation;Rolling walker (2 wheels) Toilet Transfer Details (indicate cue type and reason): simulated in room         Functional mobility during ADLs: Supervision/safety;Rolling walker (2 wheels) General ADL Comments: pt progressing well towards goals, initated energy conservation education    Extremity/Trunk Assessment              Vision       Perception     Praxis      Cognition Arousal/Alertness: Awake/alert Behavior During Therapy: WFL for tasks assessed/performed Overall Cognitive Status: Within Functional Limits for tasks assessed                                          Exercises      Shoulder Instructions       General Comments Spo2 maintained on RA during session    Pertinent Vitals/ Pain       Pain Assessment Pain Assessment: No/denies pain Pain Intervention(s): Monitored during session  Home Living  Prior Functioning/Environment              Frequency  Min 2X/week        Progress Toward Goals  OT Goals(current goals can now be found in the care plan section)  Progress towards OT goals: Progressing toward goals  Acute Rehab OT Goals OT Goal Formulation: With patient Time For Goal Achievement: 08/17/21 Potential to Achieve Goals: Good  Plan Discharge plan remains appropriate;Frequency remains appropriate    Co-evaluation                 AM-PAC OT "6 Clicks" Daily Activity     Outcome Measure   Help from another person eating meals?: None Help from another person taking care of personal grooming?: A Little Help from another person toileting, which  includes using toliet, bedpan, or urinal?: A Little Help from another person bathing (including washing, rinsing, drying)?: A Little Help from another person to put on and taking off regular upper body clothing?: None Help from another person to put on and taking off regular lower body clothing?: A Little 6 Click Score: 20    End of Session Equipment Utilized During Treatment: Rolling walker (2 wheels)  OT Visit Diagnosis: Unsteadiness on feet (R26.81);Other abnormalities of gait and mobility (R26.89);Other (comment) (decreaed activity tolerance)   Activity Tolerance Patient tolerated treatment well   Patient Left in chair;with call bell/phone within reach;with chair alarm set   Nurse Communication Mobility status        Time: 9485-4627 OT Time Calculation (min): 19 min  Charges: OT General Charges $OT Visit: 1 Visit OT Treatments $Self Care/Home Management : 8-22 mins  Jolaine Artist, OT [place] Pager [phone] Office [place] 08/10/2021, 10:29 AM

## 2021-08-10 NOTE — Progress Notes (Addendum)
Progress Note  Patient Name: Dylan [name] Date of Encounter: 08/10/2021  Primary Cardiologist: Evalina Field, MD   Subjective   S/p LHC and RHC. Patient notes that these procedures were tolerated well. Had some R groin discomfort that has now resolved. Has had Sats in the 90s, doesn't note long term smoking history without hx of COPD per patient.  Inpatient Medications    Scheduled Meds:  allopurinol  100 mg Oral Daily   aspirin EC  81 mg Oral Daily   atorvastatin  40 mg Oral Daily   carvedilol  25 mg Oral BID WC   cycloSPORINE  1 drop Both Eyes BID   heparin  5,000 Units Subcutaneous Q8H   hydrALAZINE  50 mg Oral Q8H   insulin aspart  0-15 Units Subcutaneous TID WC   insulin aspart  2 Units Subcutaneous TID WC   insulin glargine-yfgn  13 Units Subcutaneous QHS   isosorbide mononitrate  60 mg Oral Daily   mouth rinse  15 mL Mouth Rinse BID   sodium chloride flush  3 mL Intravenous Q12H   sodium chloride flush  3 mL Intravenous Q12H   sodium chloride flush  3 mL Intravenous Q12H   Continuous Infusions:  sodium chloride Stopped (08/07/21 1557)   sodium chloride     PRN Meds: sodium chloride, sodium chloride, acetaminophen, albuterol, ondansetron (ZOFRAN) IV, sodium chloride flush, sodium chloride flush   Vital Signs    Vitals:   08/09/21 1416 08/09/21 1944 08/09/21 2148 08/10/21 0419  BP:  134/73 140/84 (!) 172/97  Pulse:  66  73  Resp:  17  19  Temp:  98.5 F (36.9 C)  99 F (37.2 C)  TempSrc:  Oral  Oral  SpO2: 94% 95%  93%  Weight:    72.3 kg  Height:        Intake/Output Summary (Last 24 hours) at 08/10/2021 0853 Last data filed at 08/10/2021 0810 Gross per 24 hour  Intake 938.06 ml  Output 1125 ml  Net -186.94 ml   Filed Weights   08/08/21 1726 08/09/21 0330 08/10/21 0419  Weight: 72.6 kg 72.7 kg 72.3 kg    Telemetry    Rate controlled AF, RBBB  - Personally Reviewed   Physical Exam   Gen: no distress,   Neck: No JVD at 90  degrees Cardiac: No Rubs or Gallops, holosystolic Murmur LLSB, IRIR R radial site c/d/I with no bruits Respiratory: Clear to auscultation bilaterally, normal effort, regular  respiratory rate GI: Soft, nontender, non-distended  MS: No  edema;  moves all extremities Integument: Skin feels war, R groin femoral site without hematoma Neuro:  At time of evaluation, alert and oriented to person/place/time/situation  Psych: Normal affect, patient feels well   Labs    Chemistry Recent Labs  Lab 08/08/21 0602 08/09/21 0205 08/09/21 1004 08/09/21 1009 08/10/21 0209  NA 141 139 142   143 143 140  K 3.4* 3.8 3.7   3.7 3.7 3.9  CL 106 104  --   --  106  CO2 26 25  --   --  24  GLUCOSE 86 111*  --   --  140*  BUN 24* 27*  --   --  26*  CREATININE 1.39* 1.57*  --   --  1.57*  CALCIUM 8.6* 8.8*  --   --  9.0  GFRNONAA 52* 45*  --   --  45*  ANIONGAP 9 10  --   --  10  Hematology Recent Labs  Lab 08/07/21 0144 08/09/21 0205 08/09/21 1004 08/09/21 1009 08/10/21 0209  WBC 9.1 9.1  --   --  9.0  RBC 3.45* 3.45*  --   --  3.62*  HGB 10.7* 10.6* 11.2*   11.2* 11.2* 11.3*  HCT 33.6* 33.3* 33.0*   33.0* 33.0* 34.8*  MCV 97.4 96.5  --   --  96.1  MCH 31.0 30.7  --   --  31.2  MCHC 31.8 31.8  --   --  32.5  RDW 13.3 13.2  --   --  13.4  PLT 129* 141*  --   --  141*    Cardiac EnzymesNo results for input(s): TROPONINI in the last 168 hours. No results for input(s): TROPIPOC in the last 168 hours.   BNP Recent Labs  Lab 08/03/21 2216  BNP 3,479.4*     DDimer No results for input(s): DDIMER in the last 168 hours.   Radiology    CARDIAC CATHETERIZATION  Result Date: 08/09/2021   Mid LAD lesion is 45% stenosed.   2nd Diag lesion is 85% stenosed. Single-vessel coronary obstructive disease with 45% mid LAD stenosis in the region of the takeoff of a small second diagonal vessel which had 85% ostial/proximal stenosis. Normal left circumflex and RCA. Moderate pulmonary hypertension  with mean PA at 36 mm. PVR: 6.0 WU RECOMMENDATION: Guideline directed medical therapy for the patient's reduced LV function and pulmonary hypertension.  The patient's LV dysfunction is out of proportion to his CAD; initiate medical therapy.    Cardiac Studies   Unable to see RHC tracings but PVR of 6, small D2 lesions  Patient Profile     76 y.o. 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 with HCM phenotype, septal thickness 21, 3% LGE scar, LVEF < 50% CKD Stage III Persistent AF vs AFL with variable conduction, CHASVASC NA in the setting of HCM HTN with DM Nocturnal Bradycardia and HB without sx OSA needing CPAP CAD with stable angina on GDMT (small diagonal branch) -  will start back losartan daily, if similar creatinine will also restart back MRA (he wasn't aware of aldactone related gynecomastia, and he would not be able to afford eplerenone) which will help with pressures (unclear total contrast load) - Presently ARNI and SGLT2i are not financially feasible - similarly, he would need to spend 3-4% of his income on DOAC for him to be amenable for financial assist; he is amenable to restarting [facility] but wanted [place] so speak with his daughter before starting coumadin; she has a voicemail that is not set up yet. - continue imdur 60 mg hydralazine 50 and coreg 25 - on ASA for now; would switch to coumadin if feasible - LVEF < 50% in the setting of HCM would be an ICD indications, given his lack of ectopy this conversation could be deferred until oupatient (he is 76 yo without prior syncope) -Addendum: primary team is planning for discharge: can start lasix 40 mg PO daily, will need outpatient start of either eplerenone  (pharmacy is pricing checking) or aldactone (patient denies allergy to me, I was unable to clarify it with chart review).   For questions or updates,  please contact [place] Please consult [email or web address] for contact info under Cardiology/STEMI.      Signed, Werner Lean, MD  08/10/2021, 8:53 AM

## 2021-08-10 NOTE — TOC Transition Note (Signed)
Transition of Care [facility]) - CM/SW Discharge Note   Patient Details  Name: Dylan [name] MRN: [id number] Date of Birth: April 30, 1946  Transition of Care [facility]) CM/SW Contact:  Zenon Mayo, RN Phone Number: 08/10/2021, 11:28 AM   Clinical Narrative:    Patient is for dc home today, NCM notified Anderson Malta with [facility] of this information.  Per MD meds will be sent to [facility] pharmacy to be filled.  Also she states their pharmacist at the office will be following up with patient regarding his anticoagulation, she states patient refused coumadin also.     Final next level of care: [place] Barriers to Discharge: Continued Medical Work up   Patient Goals and CMS Choice Patient states their goals for this hospitalization and ongoing recovery are:: return home CMS Medicare.gov Compare Post Acute Care list provided to:: Patient Choice offered to / list presented to : Patient  Discharge Placement                       Discharge Plan and Services In-house Referral: NA Discharge Planning Services: CM Consult Post Acute Care Choice: Home Health            DME Agency: NA       HH Arranged: RN, PT, OT, Disease Management [place] Agency: [facility] Date [place]: 08/07/21 Time HH Agency Contacted: 64 Representative spoke with at [place]: Freddie Breech  Social Determinants of Health (SDOH) Interventions Food Insecurity Interventions: Intervention Not Indicated Financial Strain Interventions: Intervention Not Indicated Housing Interventions: Intervention Not Indicated Transportation Interventions: Intervention Not Indicated   Readmission Risk Interventions No flowsheet data found.

## 2021-08-10 NOTE — Discharge Instructions (Signed)
You were admitted for heart failure exacerbation that caused you to have fluid buildup. We treated you with IV medication to get lots of fluid off of you. Please take your medications as instructed, notably:   Continue taking Losartan  Continue taking Coreg  Start taking Lasix once a day, every day Start taking Imdur  Start taking Hydralazine  Start taking Eliquis   Follow up in the internal medicine clinic in 1 week to get lab work and to decide if other medications need to be started or stopped.

## 2021-08-10 NOTE — Telephone Encounter (Signed)
TOC HFU appointment scheduled on 08/20/2021 at 10:15 am.

## 2021-08-10 NOTE — Discharge Summary (Addendum)
Name: Dylan [name] MRN: [id number] DOB: 01-Nov-1945 76 y.o. PCP: Angelica Pou, MD  Date of Admission: 08/03/2021  3:12 PM Date of Discharge:  08/10/21 Attending Physician: Dr. 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   aspirin EC 81 MG tablet Take 81 mg by mouth daily. Swallow whole.   atorvastatin 40 MG tablet Commonly known as: LIPITOR Take 1 tablet (40 mg total) by mouth daily.   carboxymethylcellulose 0.5 % Soln Commonly known as: REFRESH PLUS Place 1 drop into both eyes 3 (three) times daily as needed (dry eyes).   carvedilol 25 MG tablet Commonly known as: Coreg Take 1 tablet (25 mg total) by mouth 2 (two) times daily with a meal.   cycloSPORINE 0.05 % ophthalmic emulsion Commonly known as: Restasis Place 1 drop into both eyes 2 (two) times daily.   Eliquis 5 MG Tabs tablet Generic drug: apixaban Take 1 tablet (5 mg total) by mouth 2 (two) times daily.   fluticasone 50 MCG/ACT nasal spray Commonly known as: FLONASE Place 2 sprays into both nostrils daily.   FREESTYLE LITE  test strip Generic drug: glucose blood Use to check blood sugar 3 times daily, DIAG CODE E11.29, INSULIN DEPENDENT   furosemide 40 MG tablet Commonly known as: Lasix Take 1 tablet (40 mg total) by mouth daily.   hydrALAZINE 50 MG tablet Commonly known as: APRESOLINE Take 1 tablet (50 mg total) by mouth every 8 (eight) hours.   isosorbide mononitrate 60 MG 24 hr tablet Commonly known as: IMDUR Take 1 tablet (60 mg total) by mouth daily. Start taking on: August 11, 2021   latanoprost 0.005 % ophthalmic solution Commonly known as: XALATAN SMARTSIG:In Eye(s)   loratadine 10 MG tablet Commonly known as: CLARITIN Take 10 mg by mouth daily.   losartan 100 MG tablet Commonly known as: Cozaar Take 1 tablet (100 mg total) by mouth daily.   metFORMIN 500 MG tablet Commonly known as: Glucophage Take 1 tablet (500 mg total) by mouth in the morning and at bedtime.   polyethylene glycol 17 g packet Commonly known as: MIRALAX / GLYCOLAX Take 17 g by mouth daily as needed (constipation).        DISPOSITION AND FOLLOW-UP:  Dylan [name] was discharged from [facility] in stable condition. At the hospital follow up visit please address:  Follow-up Recommendations: Consults: none  Labs: CBC, BMP  Studies: none  Medications:  Continue Losartan 100 Continue taking Coreg 25 Start taking Lasix 40 qd  Start taking Imdur  60  Start taking Hydralazine 50 tid Start taking Eliquis 5 bid   Follow-up Appointments:  Follow-up Information     [facility]. Go to.   Specialty: Cardiology Why: Wednesday, February 8 @ 11AM for [facility] clinic within [place].  Bring all medications with you.  FREE valet parking at [employer], off [address]. Contact information: [address] [id number] [place], [place] Follow up.    Specialty: Home Health Services Why: [facility] Contact information: [name] [address] [phone]         Angelica Pou, MD. Call in 1 week(s).   Specialty: Internal Medicine Why: Please call the clinic on Monday to schedule an appt for 1 week from today. Contact information: [address] [name] [address] [place], Cassie Freer, MD .   Specialties: Cardiology, Internal Medicine, Radiology Contact information: 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 BNP 3500, CXR with mild pulmonary edema, and exam with 1+ pitting edema, JVD, and abdominal distention. Trops 91 -> 95 2/2 demand. Follows Dr Farris Has. 12/2020 echo with EF 50% and 03/2021 cardiac MRI with concerns for hypertrophic cardiomyopathy and EF 25% (significant artifact present). 07/25/21 perfusion imaging with declined EF from 50% to 21% and severely reduced global function. Previously thoughts for amyloid were investigated, PYP equivocal, MRI no clear evidence. He is on Coreg 25 mg bid; Losartan increased to 100 mg qd, and Spirolactone 25 mg qd started during 1/9 cardiology appointment. SGLT2 inhibitor not started at that time 2/2 cost.  Cardiology consulted. Appropriate response to Lasix over the next several days, with improvement in weight from 173 lbs admission to 159 lbs (dry weight 160 lbs). R & L heart cath Results showing single-vessel coronary obstructive disease with 45% mid LAD stenosis in the region of the takeoff of a small second diagonal vessel which had 85% ostial/proximal stenosis. Normal left circumflex and RCA, and moderate pulm HTN. Recommending GDMT for reduced LVEF and  pulmonary HTN.  On day of discharge: -Started back on Losartan during day of d/c after renal function improved.  -Continued imdur 60 mg hydralazine 50 and coreg 25 -Started Lasix 40 po qd  -Started Eliquis  -ARNI and SGLT2i are not financially feasible -Start eplerenone (will likely be unable to afford) or aldactone at post hospital follow up visit after lab work.   AKI on CKD stage 3a Baseline 1.4, up to near 2 during admission. Suspect cardiorenal. Renal US negative. Cr back to baseline with days of diuresis.  - held losartan and spirolactone during hospitalization  - Avoid nephrotoxic agents   Paroxysmal A. fib/a flutter - Continued rate control with home Coreg. - Not taking Eliquis at home due to cost - Continued Eliquis here - [facility] consult for help with Eliquis  Rectal bleeding Iron deficiency Patient reports seeing Dr. Benson Norway with GI for his rectal bleeding, thought is due to diverticular bleed. His colonoscopy in 2020 showed diverticulosis otherwise unremarkable.   Hemoglobin continues to trend down from 11.7->10.5, and then  improved with no ongoing signs of blood loss. He denies any more episode of hematochezia. Held Eliquis until hemoglobin stable. Iron studies consistent with deficiency, will discuss repletion while inpatient. - Trended CBCs   HTN -Held losartan and spironolactone in setting of AKI. Continued Coreg. -Required up-titration of Hydral and Imdur to better control BP.    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  There was no evidence of monoclonal protein on the SPEP with the IFE.  Due to concern for possible cardiac amyloidosis the patient was referred to hematology for further evaluation management. UPEP to look for monoclonal protein negative. Findings thought to be consistent with CKD. - Follow up with  Dr Dorsey/ hematology    DISCHARGE INSTRUCTIONS:   Discharge Instructions     ([place]) Call MD:  Anytime you have any of the following symptoms: 1) 3 pound weight gain in 24 hours or 5 pounds in 1 week 2) shortness of breath, with or without a dry hacking cough 3) swelling in the hands, feet or stomach 4) if you have to sleep on extra pillows at night in order to breathe.   Complete by: As directed    Call MD for:  difficulty breathing, headache or visual disturbances   Complete by: As directed    Call MD for:  extreme fatigue   Complete by: As directed    Call MD for:  hives   Complete by: As directed    Call MD for:  persistant dizziness or light-headedness   Complete by: As directed    Call MD for:  persistant nausea and vomiting   Complete by: As directed    Call MD for:  redness, tenderness, or signs of infection (pain, swelling, redness, odor or green/yellow discharge around incision site)   Complete by: As directed    Call MD for:  severe uncontrolled pain   Complete by: As directed    Call MD for:  temperature >100.4   Complete by: As directed    Diet - low sodium heart healthy   Complete by: As directed    Increase activity slowly   Complete by: As directed        SUBJECTIVE:  No acute overnight events. Patient was seen at bedside during rounds this morning. Pt reports feeling excellent this morning. He denies feeling short of breath and is happy with the improvement in leg swelling.  No other complains or concerns at this time.   All questions were addressed with patient prior to being discharged.   Patient asked team and cardiology to call his daughter for an update. Cardiology team unable to reach pt and voicemail unavailable. I tried calling the pt's daughter via the number provided in the chart as well as via another number provided by pt throughout the day for a total of >5 times with no luck; no voicemail available. Pt is medically stable for  discharge and he is agreeable.    Discharge Vitals:   BP (!) 172/97    Pulse 73    Temp 99 F (37.2 C) (Oral)    Resp 19    Ht _0  (1.676 m)    Wt 72.3 kg    SpO2 93%    BMI 25.73 kg/m   OBJECTIVE:  Constitutional: alert, well-appearing, in NAD Neck: supple,  no JVD Cardiovascular: RRR, no m/r/g, trace bilateral LE Pulmonary/Chest: normal work of breathing on supplemental O2, LCTAB Abdominal: soft, non-tender to palpation, non-distended MSK: normal bulk and tone Neurological: A&O x 3, follows commands  Skin: warm and dry  Pertinent Labs, Studies, and Procedures:  CBC Latest Ref Rng & Units 08/10/2021 08/09/2021 08/09/2021  WBC 4.0 - 10.5 K/uL 9.0 - -  Hemoglobin 13.0 - 17.0 g/dL 11.3(L) 11.2(L) 11.2(L)  Hematocrit 39.0 - 52.0 % 34.8(L) 33.0(L) 33.0(L)  Platelets 150 - 400 K/uL 141(L) - -    CMP Latest Ref Rng & Units 08/10/2021 08/09/2021 08/09/2021  Glucose 70 - 99 mg/dL 140(H) - -  BUN 8 - 23 mg/dL 26(H) - -  Creatinine 0.61 - 1.24 mg/dL 1.57(H) - -  Sodium 135 - 145 mmol/L 140 143 142  Potassium 3.5 - 5.1 mmol/L 3.9 3.7 3.7  Chloride 98 - 111 mmol/L 106 - -  CO2 22 - 32 mmol/L 24 - -  Calcium 8.9 - 10.3 mg/dL 9.0 - -  Total Protein 6.5 - 8.1 g/dL - - -  Total Bilirubin 0.3 - 1.2 mg/dL - - -  Alkaline Phos 38 - 126 U/L - - -  AST 15 - 41 U/L - - -  ALT 0 - 44 U/L - - -    DG Chest 2 View  Result Date: 08/03/2021 CLINICAL DATA:  Shortness of breath EXAM: CHEST - 2 VIEW COMPARISON:  Chest x-ray 12/31/2020 FINDINGS: Cardiomegaly and mediastinum appear unchanged. Calcified plaques in the aortic arch. Mild pulmonary vascular congestion and low lung volumes. Bibasilar likely subsegmental atelectasis. No pleural effusion or pneumothorax visualized. IMPRESSION: 1. Cardiomegaly and mild pulmonary vascular congestion. 2. Low lung volumes with bibasilar likely subsegmental atelectasis. Electronically Signed   By: Ofilia Neas M.D.   On: 08/03/2021 16:42   US RENAL  Result Date:  08/03/2021 CLINICAL DATA:  Acute kidney injury. EXAM: RENAL / URINARY TRACT ULTRASOUND COMPLETE COMPARISON:  Renal ultrasound 04/23/2018. FINDINGS: Right Kidney: Renal measurements: 9.7 x 4.9 x 5.3 cm = volume: 133 mL. No hydronephrosis. Echogenicity is increased. There is a 1.3 x 1.1 x 1.3 cm cyst in the inferolateral right kidney. Left Kidney: Renal measurements: 10.7 x 5.6 x 5.3 cm = volume: 166 mL. No hydronephrosis. Echogenicity is increased. Bladder: There is bladder wall thickening versus normal under distension. Bladder wall measures 5.5 mm in thickness. Other: Ascites and bilateral pleural effusions noted. IMPRESSION: 1. Echogenic kidneys likely related to medical renal disease. No hydronephrosis. 2. Findings worrisome for bladder wall thickening. Correlate for cystitis. 3. Ascites and bilateral pleural effusions. Electronically Signed   By: Ronney Asters M.D.   On: 08/03/2021 21:52   ECHOCARDIOGRAM COMPLETE  Result Date: 08/05/2021    ECHOCARDIOGRAM REPORT   Patient Name:   Dylan [name] Date of Exam: 08/04/2021 Medical Rec #:  [id number]    Height:       66.0 in Accession #:    [id number]   Weight:       172.8 lb Date of Birth:  26-Jan-1946    BSA:          1.880 m Patient Age:    73 years     BP:           166/101 mmHg Patient Gender: M            HR:           78 bpm. Exam Location:  Inpatient Procedure: 2D Echo, Cardiac Doppler and  Color Doppler                                 MODIFIED REPORT:  This report was modified by Candee Furbish MD on 08/05/2021 due to RV function is                                severely reduced.  Indications:     Congestive Heart Failure I50.9  History:         Patient has prior history of Echocardiogram examinations, most                  recent 01/01/2021. Hypertrophic Cardiomyopathy, CAD, Pulmonary                  HTN; Risk Factors:Diabetes, Former Smoker, Sleep Apnea and                  Hypertension.  Sonographer:     Bernadene Person RDCS Referring Phys:  [place] Diagnosing Phys: Candee Furbish MD IMPRESSIONS  1. Left ventricular ejection fraction, by estimation, is 30 to 35%. The left ventricle has moderately decreased function. The left ventricle has no regional wall motion abnormalities. Left ventricular diastolic parameters are indeterminate. There is the  interventricular septum is flattened in systole and diastole, consistent with right ventricular pressure and volume overload.  2. Right ventricular systolic function is severely reduced. The right ventricular size is moderately enlarged. There is severely elevated pulmonary artery systolic pressure. The estimated right ventricular systolic pressure is 52.8 mmHg.  3. Left atrial size was severely dilated.  4. Right atrial size was severely dilated.  5. A small pericardial effusion is present. The pericardial effusion is circumferential.  6. The mitral valve is normal in structure. Mild mitral valve regurgitation. No evidence of mitral stenosis.  7. Tricuspid valve regurgitation is severe.  8. The aortic valve is normal in structure. There is moderate calcification of the aortic valve. There is mild thickening of the aortic valve. Aortic valve regurgitation is not visualized. Aortic valve sclerosis/calcification is present, without any evidence of aortic stenosis.  9. The inferior vena cava is dilated in size with <50% respiratory variability, suggesting right atrial pressure of 15 mmHg. Comparison(s): Prior images reviewed side by side. The left ventricular function is worsened. FINDINGS  Left Ventricle: Left ventricular ejection fraction, by estimation, is 30 to 35%. The left ventricle has moderately decreased function. The left ventricle has no regional wall motion abnormalities. The left ventricular internal cavity size was normal in size. There is no left ventricular hypertrophy. The interventricular septum is flattened in systole and diastole, consistent with right ventricular pressure and volume overload. Left  ventricular diastolic parameters are indeterminate. Right Ventricle: The right ventricular size is moderately enlarged. No increase in right ventricular wall thickness. Right ventricular systolic function is severely reduced. There is severely elevated pulmonary artery systolic pressure. The tricuspid regurgitant velocity is 3.38 m/s, and with an assumed right atrial pressure of 15 mmHg, the estimated right ventricular systolic pressure is 41.3 mmHg. Left Atrium: Left atrial size was severely dilated. Right Atrium: Right atrial size was severely dilated. Pericardium: A small pericardial effusion is present. The pericardial effusion is circumferential. Mitral Valve: The mitral valve is normal in structure. Mild mitral valve regurgitation. No evidence of mitral valve stenosis. Tricuspid Valve: The tricuspid valve is normal  in structure. Tricuspid valve regurgitation is severe. No evidence of tricuspid stenosis. Aortic Valve: The aortic valve is normal in structure. There is moderate calcification of the aortic valve. There is mild thickening of the aortic valve. Aortic valve regurgitation is not visualized. Aortic valve sclerosis/calcification is present, without any evidence of aortic stenosis. Pulmonic Valve: The pulmonic valve was normal in structure. Pulmonic valve regurgitation is mild. No evidence of pulmonic stenosis. Aorta: The aortic root is normal in size and structure. Venous: The inferior vena cava is dilated in size with less than 50% respiratory variability, suggesting right atrial pressure of 15 mmHg. IAS/Shunts: No atrial level shunt detected by color flow Doppler.  LEFT VENTRICLE PLAX 2D LVIDd:         4.80 cm      Diastology LVIDs:         3.90 cm      LV e' medial:    3.99 cm/s LV PW:         1.30 cm      LV E/e' medial:  15.9 LV IVS:        1.50 cm      LV e' lateral:   4.47 cm/s LVOT diam:     1.90 cm      LV E/e' lateral: 14.2 LV SV:         23 LV SV Index:   12 LVOT Area:     2.84 cm  LV  Volumes (MOD) LV vol d, MOD A2C: 141.0 ml LV vol d, MOD A4C: 140.0 ml LV vol s, MOD A2C: 84.0 ml LV vol s, MOD A4C: 75.3 ml LV SV MOD A2C:     57.0 ml LV SV MOD A4C:     140.0 ml LV SV MOD BP:      62.7 ml RIGHT VENTRICLE RV S prime:     6.39 cm/s TAPSE (M-mode): 0.8 cm LEFT ATRIUM             Index        RIGHT ATRIUM           Index LA diam:        5.30 cm 2.82 cm/m   RA Area:     31.40 cm LA Vol (A2C):   86.5 ml 46.02 ml/m  RA Volume:   114.00 ml 60.65 ml/m LA Vol (A4C):   72.4 ml 38.52 ml/m LA Biplane Vol: 79.0 ml 42.03 ml/m  AORTIC VALVE LVOT Vmax:   47.00 cm/s LVOT Vmean:  28.567 cm/s LVOT VTI:    0.082 m  AORTA Ao Root diam: 3.00 cm Ao Asc diam:  3.30 cm MITRAL VALVE               TRICUSPID VALVE MV Area (PHT): 3.83 cm    TR Peak grad:   45.7 mmHg MV Decel Time: 198 msec    TR Vmax:        338.00 cm/s MV E velocity: 63.40 cm/s MV A velocity: 46.80 cm/s  SHUNTS MV E/A ratio:  1.35        Systemic VTI:  0.08 m                            Systemic Diam: 1.90 cm Candee Furbish MD Electronically signed by Candee Furbish MD Signature Date/Time: 08/04/2021/1:47:15 PM    Final (Updated)       [name], MD Internal Medicine Resident, PGY-1 Pager: [phone]

## 2021-08-10 NOTE — Progress Notes (Signed)
Heart Failure Stewardship Pharmacist Progress Note   PCP: Angelica Pou, MD PCP-Cardiologist: Evalina Field, MD    HPI:  76 yo M with PMH of CHF, aflutter/afib, CKD III, nocturnal complete heart block, HTN, T2DM, and OSA. He presented to the ED on 1/27 with worsening shortness of breath, abdominal distention, orthopnea, weight gain, and cough. CXR with mild pulmonary vascular congestion. An ECHO was done on 1/29 and LVEF is 30-35% (21% on stress test). Amyloid test in 03/2021 was negative. R/LHC today with single vessel disease and moderate pulmonary hypertension.   Current HF Medications: Beta Blocker: carvedilol 25 mg BID ACE/ARB/ARNI: losartan 100 mg daily Other: hydralazine 50 mg q8h, Imdur 60 mg daily  Prior to admission HF Medications: Beta blocker: carvedilol 25 mg BID ACE/ARB/ARNI: losartan 100 mg daily Aldosterone Antagonist: spironolactone 25 mg daily  Pertinent Lab Values: Serum creatinine 1.57, BUN 26, Potassium 3.9, Sodium 140, BNP 3479.4, A1c 7.5   Vital Signs: Weight: 159 lbs (admission weight: 173 lbs) Blood pressure: 130/90s  Heart rate: 70s  I/O: -1L yesterday; net -11.9L  Medication Assistance / Insurance Benefits Check: Does the patient have prescription insurance?  Yes Type of insurance plan: [place] [employer]  Outpatient Pharmacy:  Prior to admission outpatient pharmacy: [facility] Is the patient willing to use [place] at discharge? Yes Is the patient willing to transition their outpatient pharmacy to utilize a [facility] outpatient pharmacy?   Pending    Assessment: 1. Acute on chronic systolic CHF (EF 37%), due to NICM. Hypertropic cardiomyopathy, amyloid negative. NYHA class III symptoms. - Off IV lasix  - Continue carvedilol 25 mg BID - Agree with resuming PTA losartan today. SCr stable from yesterday - Spironolactone still on  - consider starting eplerenone 25 mg daily on discharge if intolerance to spironolactone - Continue  hydralazine 50 mg TID - Continue Imdur 60 mg daily - Consider SGLT2i at discharge  Plan: 1) Medication changes recommended at this time: - Start eplerenone 25 mg daily tomorrow if SCr stable  2) Patient assistance: - Farxiga copay $110.06 Delene Loll copay $130.03  - Can see if pt qualifies for additional assistance with high copays  3)  Education  - To be completed prior to discharge  Kerby Nora, PharmD, BCPS Heart Failure Stewardship Pharmacist Phone [phone]

## 2021-08-10 NOTE — Telephone Encounter (Signed)
Patient called requesting to speak with [name].  Told him she was not down here in the clinic this afternoon and we close at 3:30 pm, but I would send message.  Just wanted to let her know.  Would like her to give him a call back.

## 2021-08-13 MED FILL — Lidocaine HCl Local Preservative Free (PF) Inj 1%: INTRAMUSCULAR | Qty: 30 | Status: AC

## 2021-08-15 ENCOUNTER — Encounter (HOSPITAL_COMMUNITY): Payer: Medicare HMO

## 2021-08-16 ENCOUNTER — Telehealth: Payer: Self-pay | Admitting: *Deleted

## 2021-08-16 NOTE — Telephone Encounter (Signed)
Received call from Tillie Rung, [place] with [facility]. Requesting VO for  [facility] PT 2 week 2 and 1 week 2 to work on activity tolerance and disease management. States patient has been de-sating to 87% with waking 30-40 feet. 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 Med Rec was done with Tillie Rung. Patient was still taking Spironolactone. She will remove this from his home. She will also confirm with him his HFU appt for 2/13.  Verbal auth given. Will route to PCP for agreement/denial.

## 2021-08-17 ENCOUNTER — Other Ambulatory Visit (HOSPITAL_COMMUNITY): Payer: Self-pay

## 2021-08-17 ENCOUNTER — Telehealth (HOSPITAL_COMMUNITY): Payer: Self-pay

## 2021-08-17 NOTE — Telephone Encounter (Signed)
Transitions of Care Pharmacy   Call attempted for a pharmacy transitions of care follow-up. Unable to leave voicemail.   Call attempt #1. Will follow-up in 2-3 days.

## 2021-08-20 ENCOUNTER — Other Ambulatory Visit: Payer: Self-pay

## 2021-08-20 ENCOUNTER — Encounter: Payer: Self-pay | Admitting: Student

## 2021-08-20 ENCOUNTER — Ambulatory Visit (INDEPENDENT_AMBULATORY_CARE_PROVIDER_SITE_OTHER): Payer: Medicare HMO | Admitting: Student

## 2021-08-20 VITALS — BP 160/81 | HR 67 | Temp 99.1°F | Ht 66.0 in | Wt 166.7 lb

## 2021-08-20 DIAGNOSIS — I5043 Acute on chronic combined systolic (congestive) and diastolic (congestive) heart failure: Secondary | ICD-10-CM | POA: Diagnosis not present

## 2021-08-20 DIAGNOSIS — E1122 Type 2 diabetes mellitus with diabetic chronic kidney disease: Secondary | ICD-10-CM | POA: Diagnosis not present

## 2021-08-20 DIAGNOSIS — I129 Hypertensive chronic kidney disease with stage 1 through stage 4 chronic kidney disease, or unspecified chronic kidney disease: Secondary | ICD-10-CM

## 2021-08-20 DIAGNOSIS — I130 Hypertensive heart and chronic kidney disease with heart failure and stage 1 through stage 4 chronic kidney disease, or unspecified chronic kidney disease: Secondary | ICD-10-CM

## 2021-08-20 DIAGNOSIS — N1831 Chronic kidney disease, stage 3a: Secondary | ICD-10-CM | POA: Diagnosis not present

## 2021-08-20 DIAGNOSIS — I422 Other hypertrophic cardiomyopathy: Secondary | ICD-10-CM

## 2021-08-20 DIAGNOSIS — E1121 Type 2 diabetes mellitus with diabetic nephropathy: Secondary | ICD-10-CM

## 2021-08-20 DIAGNOSIS — I2510 Atherosclerotic heart disease of native coronary artery without angina pectoris: Secondary | ICD-10-CM

## 2021-08-20 MED ORDER — SPIRONOLACTONE 25 MG PO TABS: 25.0000 mg | ORAL_TABLET | Freq: Every day | ORAL | 5 refills | Status: DC

## 2021-08-20 NOTE — Patient Instructions (Addendum)
Thank you, [name] for allowing Dylan [name] to provide your care today. Today we discussed your recent hospitalization.  I am glad that you are doing better.  Your blood pressure is high so we are restarting you back on your spironolactone.  Make sure to call advanced home health to get your new oxygen tank.  I have ordered the following labs for you:  Lab Orders         BMP8+Anion Gap      I will call if any are abnormal. All of your labs can be accessed through "My Chart".  I have ordered the following medication/changed the following medications:  Restart spironolactone 25 mg daily  My Chart Access: [email or web address]  Please follow-up in next Thursday or March 16th with [name]  Please make sure to arrive 15 minutes prior to your next appointment. If you arrive late, you may be asked to reschedule.    We look forward to seeing you next time. Please call our clinic at [phone] if you have any questions or concerns. The best time to call is Monday-Friday from 9am-4pm, but there is someone available 24/7. If after hours or the weekend, call the main hospital number and ask for the Internal Medicine Resident On-Call. If you need medication refills, please notify your pharmacy one week in advance and they will send Dylan [name] a request.   Thank you for letting Dylan [name] take part in your care. Wishing you the best!  Lacinda Axon, MD 08/20/2021, 11:30 AM IM Resident, PGY-2 Oswaldo Milian 41:10

## 2021-08-20 NOTE — Progress Notes (Signed)
CC: Hospital Follow up  HPI:  [name] is a 76 y.o. M with PMH as below who presents to clinic for a hospital follow up after a recent hospitalization for heart failure exacerbation. Please see problem based charting for evaluation, assessment and plan.  Past Medical History:  Diagnosis Date   Anemia 10/18/2014   Baseline about 12 and stable from 2010 to 2016. Colon 2009 in [place] (records cannot be obtained).  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 Grade 2. EF 50-55%; echo repeated 2019, severe hypertrophy with elevated filling pressures   Chronic kidney disease, stage 3b ([place]) 03/09/2020   Dr. Justin Mend  Nephrologist, f/u Q 86M   Coronary artery disease without angina pectoris    Degloving injury of finger 03/28/2020   10/21/19: copied from op note "1.  Open reduction percutaneous pinning left small finger proximal phalanx fracture 2.  Complex repair of laceration to left small finger 3 cm in length 3.  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 No significant callus formation is noted. Pins had been removed   Diverticulosis 10/08/2014   Seen on CT. Reportedly on Colon in [place] in 2009. Freq bouts of diverticulitis.   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 Stable from 2010 to 2016.  Negative SPEP and UPEP 2014 after ARF 2/2 continued ACE (lisinopril 20) use while vol contracted. 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 Failed CPAP on study. BiPA inspiratory pressure of 21 and expiratory pressure of 17 CWP. Consider ENT evaluation for potentially correctable upper airway obstruction contributing to the need for unusually high pressures - ENT July 2015 did not feel any intervention surgically was indicated. He wore a large F&P Simplus fullfac   Personal history of colonic polyps 03/28/2014   Dr. Benson Norway, 2 polyps removed 2015.  No polyps on f/u in 2020.  No further surveillance suggested per Dr. Benson Norway.   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  He is unable to breathe through his nose comfortably.  This is part of the reason why he does not wear his oxygen regularly.  Nasal passages are nearly completely obstructed erythematous smooth glistening surface.  He has been told by his eye doctor that part of the reason his e  Resistant hypertension 10/09/2014   Poor control with 6 drug therapy. 2016 : Aldo 37 but ARR 23.5    Severe pulmonary arterial systolic hypertension ([place]) 10/18/2014   Noted as severe ECHO 2016 and 2019. Likely 2/2 severe untreated OSA. Pt is not adherent to CPAP.   Syncope 12/31/2020   Thrombocytopenia ([place]) 10/09/2016   Nl liver and spleen on [place] 2019   Ulcer aphthous oral 04/27/2020   Evaluated emergency room last week, saw with Magic mouthwash   Unintentional weight loss  due to Trulicity, resolved 8/67/6195    Review of Systems:  Constitutional: Positive for fatigue.  Eyes: Negative for visual changes Respiratory: Positive dyspnea on exertion. Cardiac: Negative for chest pain MSK: Negative for back pain Neuro: Negative for headache or weakness  Physical Exam: General: Pleasant, chronically ill elderly male. No acute distress. Neck: No JVD. Supple Cardiac: RRR. No murmurs, rubs or gallops. No LE edema Respiratory: Lungs CTAB. No wheezing or crackles.  No increased WOB.  Skin: Warm, dry and intact without rashes or lesions Extremities: Atraumatic. Full ROM.  2+ radial and DP pulses. Neuro: A&O x 3.  Normal sensation to gross touch  Vitals:   08/20/21 1027 08/20/21 1122  BP: (!) 143/80 (!) 160/81  Pulse: 70 67  Temp: 99.1 F (37.3 C)   TempSrc: Oral   SpO2: 94%   Weight: 166 lb 11.2 oz (75.6 kg)   Height: _0  (1.676 m)     Assessment & Plan:   See Encounters Tab for problem based charting.  Patient discussed with Dr. Louann Liv, MD, MPH

## 2021-08-21 ENCOUNTER — Other Ambulatory Visit (HOSPITAL_BASED_OUTPATIENT_CLINIC_OR_DEPARTMENT_OTHER): Payer: Self-pay

## 2021-08-21 LAB — BMP8+ANION GAP
Anion Gap: 17 mmol/L (ref 10.0–18.0)
BUN/Creatinine Ratio: 19 (ref 10–24)
BUN: 30 mg/dL — ABNORMAL HIGH (ref 8–27)
CO2: 22 mmol/L (ref 20–29)
Calcium: 9.1 mg/dL (ref 8.6–10.2)
Chloride: 108 mmol/L — ABNORMAL HIGH (ref 96–106)
Creatinine, Ser: 1.55 mg/dL — ABNORMAL HIGH (ref 0.76–1.27)
Glucose: 252 mg/dL — ABNORMAL HIGH (ref 70–99)
Potassium: 3.3 mmol/L — ABNORMAL LOW (ref 3.5–5.2)
Sodium: 147 mmol/L — ABNORMAL HIGH (ref 134–144)
eGFR: 46 mL/min/{1.73_m2} — ABNORMAL LOW (ref >59)

## 2021-08-22 ENCOUNTER — Encounter: Payer: Self-pay | Admitting: Student

## 2021-08-22 MED ORDER — FUROSEMIDE 40 MG PO TABS: 40.0000 mg | ORAL_TABLET | Freq: Every day | ORAL | 5 refills | Status: DC

## 2021-08-22 NOTE — Assessment & Plan Note (Signed)
A1c 7.5% 2 weeks ago. Patient's blood sugar still elevated to the 200s.  Patient has been tolerating metformin so we will titrate to the maximal dose at next office visit. He would likely benefit from SGLT2 inhibitor as well if insurance will cover.   Plan: -- Continue metformin 500 mg twice daily, titrate to 1000 mg twice daily at next office visit -- Refer for eye exam next office visit

## 2021-08-22 NOTE — Assessment & Plan Note (Addendum)
Patient recently admitted to the hospital from 1/27-2/3 for acute on chronic heart failure. Repeat echocardiogram on 1/28 showed EF 30-35%, severely elevated PASP, severely reduced RVSF, severely dilated LA and RA, small pericardial effusion. He was diuresed with improvement in respiratory status and weight down 14 pounds on discharge. Since admission, patient reported his dyspnea on exertion has improved. Continues to endorse some fatigue but does not get out of breath as quickly as did before he was hospitalized. He has not noticed any leg swelling.  He is taking his Lasix daily. On exam, there is no JVD, I did not appreciate any crackles on lung auscultation and there is no lower extremity edema. On chart review, looks like patient missed his cardiology appointment on February 8th. BMP today shows stable kidney function but mild hypokalemia to 3.3. Restarting his spironolactone today. Plan to see patient back in the week to recheck BMP and start on potassium supplementation if needed.  Plan: -- Refilled Lasix 40 mg daily -- Restart spironolactone 25 mg daily -- Continue GDMT with Coreg 25 mg twice daily, Imdur 60 mg daily, losartan 100 mg daily -- 1 week follow-up for repeat BMP -- Follow-up with PCP scheduled for 3/16 -- Follow-up with cardiology scheduled for 4/17

## 2021-08-22 NOTE — Assessment & Plan Note (Signed)
Patient continue to deny anginal symptoms.  Heart cath during recent hospitalization did not show any significant stenosis. Cardiology recommended medical therapy. -- Continue ASA 81 mg daily -- Continue atorvastatin 40 mg daily -- Follow-up with cardiology on 4/17

## 2021-08-22 NOTE — Assessment & Plan Note (Addendum)
Resolved. Patient recently admitted to the hospital from 1/27-2/3 for acute on chronic heart failure. He was diuresed with improvement in respiratory status and weight down 14 pounds on discharge.  Since admission, patient reported his dyspnea on exertion has improved. Continues to endorse some fatigue but does not get out of breath as quickly as he did before. He has not noticed any leg swelling.  He is taking his Lasix daily. On exam, there is no JVD, I did not appreciate any crackles on lung auscultation and there is no lower extremity edema. On chart review, looks like patient missed his cardiology appointment on February 8th. BMP today shows stable kidney function but mild hypokalemia to 3.3. Restarting his spironolactone today. Plan to see patient back in the week to recheck BMP and start on potassium supplementation if needed.   Plan: -- Refilled Lasix 40 mg daily -- Restart spironolactone 25 mg daily -- Continue GDMT with Coreg 25 mg twice daily, Imdur 60 mg daily, losartan 100 mg daily -- 1 week follow-up for repeat BMP -- Follow-up with PCP scheduled for 3/16 -- Follow-up with cardiology scheduled for 4/17

## 2021-08-22 NOTE — Assessment & Plan Note (Signed)
BMP today shows no significant improvement in kidney function from hospitalization. Creatinine 1.55 from 1.57, sodium 147, potassium 3.3 eGFR 46. Reinitiated patient back on spironolactone during visit today.  Will help improve hypokalemia, however patient needs repeat BMP in the next week  --Follow-up visit in 1 week for repeat BMP

## 2021-08-22 NOTE — Assessment & Plan Note (Addendum)
BP improved from last office visit and recent hospitalization but still elevated to SBP in the 140s to 160s.  BMP shows mild hypokalemia.  Restarting patient on spironolactone today. -- Restart spironolactone 25 mg daily -- Continue Coreg 25 mg twice daily, hydralazine 50 mg every 8 hours, losartan 100 mg daily, Lasix 40 mg daily and Imdur 60 mg daily

## 2021-08-23 ENCOUNTER — Telehealth (HOSPITAL_COMMUNITY): Payer: Self-pay | Admitting: Pharmacist

## 2021-08-23 ENCOUNTER — Other Ambulatory Visit (HOSPITAL_COMMUNITY): Payer: Self-pay

## 2021-08-23 NOTE — Telephone Encounter (Signed)
Transitions of Care Pharmacy   Call attempted for a pharmacy transitions of care follow-up. No voicemail.  No alternate numbers.   Call attempt #2. Will follow-up in 2-3 days.

## 2021-08-27 ENCOUNTER — Other Ambulatory Visit (HOSPITAL_COMMUNITY): Payer: Self-pay

## 2021-08-27 NOTE — Progress Notes (Signed)
Internal Medicine Clinic Attending  Case discussed with Dr. Coy Saunas  At the time of the visit.  We reviewed the residents history and exam and pertinent patient test results.  I agree with the assessment, diagnosis, and plan of care documented in the residents note.

## 2021-08-30 ENCOUNTER — Encounter: Payer: Medicare HMO | Admitting: Student

## 2021-08-31 ENCOUNTER — Other Ambulatory Visit (HOSPITAL_COMMUNITY): Payer: Self-pay

## 2021-08-31 ENCOUNTER — Other Ambulatory Visit: Payer: Self-pay | Admitting: *Deleted

## 2021-08-31 DIAGNOSIS — I4892 Unspecified atrial flutter: Secondary | ICD-10-CM

## 2021-08-31 MED ORDER — APIXABAN 5 MG PO TABS: 5.0000 mg | ORAL_TABLET | Freq: Two times a day (BID) | ORAL | 3 refills | Status: DC

## 2021-08-31 MED ORDER — FUROSEMIDE 40 MG PO TABS: 40.0000 mg | ORAL_TABLET | Freq: Every day | ORAL | 3 refills | Status: DC

## 2021-08-31 MED ORDER — ISOSORBIDE MONONITRATE ER 60 MG PO TB24: 60.0000 mg | ORAL_TABLET | Freq: Every day | ORAL | 3 refills | Status: DC

## 2021-08-31 MED ORDER — HYDRALAZINE HCL 50 MG PO TABS: 50.0000 mg | ORAL_TABLET | Freq: Three times a day (TID) | ORAL | 5 refills | Status: DC

## 2021-09-06 ENCOUNTER — Encounter: Payer: Self-pay | Admitting: Student

## 2021-09-06 ENCOUNTER — Other Ambulatory Visit: Payer: Self-pay

## 2021-09-06 ENCOUNTER — Ambulatory Visit (INDEPENDENT_AMBULATORY_CARE_PROVIDER_SITE_OTHER): Payer: Medicare HMO | Admitting: Student

## 2021-09-06 VITALS — BP 143/68 | HR 56 | Temp 98.5°F | Resp 28 | Ht 66.0 in | Wt 166.1 lb

## 2021-09-06 DIAGNOSIS — I422 Other hypertrophic cardiomyopathy: Secondary | ICD-10-CM

## 2021-09-06 DIAGNOSIS — I5032 Chronic diastolic (congestive) heart failure: Secondary | ICD-10-CM

## 2021-09-06 MED ORDER — SPIRONOLACTONE 25 MG PO TABS: 25.0000 mg | ORAL_TABLET | Freq: Every day | ORAL | 0 refills | Status: DC

## 2021-09-06 NOTE — Patient Instructions (Addendum)
Mr.Jeanpaul Cecilie Kicks, it was a pleasure seeing you today! ? ?Today we discussed: ?- I would like for you to add a medication to your regimen. This is called spironolactone. I want you to take this once daily. I will call you tomorrow with the results of your lab work.  ? ?- You have a cardiology appointment on October 22, 2021 at 10:20AM. ? ?I have ordered the following labs today: ? ?Lab Orders    ?     BMP8+Anion Gap    ?  ? ?I have ordered the following medication/changed the following medications:  ? ?Start the following medications: ?Meds ordered this encounter  ?Medications  ? spironolactone (ALDACTONE) 25 MG tablet  ?  Sig: Take 1 tablet (25 mg total) by mouth daily.  ?  Dispense:  30 tablet  ?  Refill:  0  ?  ? ?Follow-up:  September 20, 2021 at 10:45AM   ? ?Please make sure to arrive 15 minutes prior to your next appointment. If you arrive late, you may be asked to reschedule.  ? ?We look forward to seeing you next time. Please call our clinic at [phone] if you have any questions or concerns. The best time to call is Monday-Friday from 9am-4pm, but there is someone available 24/7. If after hours or the weekend, call the main hospital number and ask for the Internal Medicine Resident On-Call. If you need medication refills, please notify your pharmacy one week in advance and they will send [place] a request. ? ?Thank you for letting us take part in your care. Wishing you the best! ? ?Thank you, ?Sanjuan Dame, MD ? ?

## 2021-09-07 LAB — BMP8+ANION GAP
Anion Gap: 17 mmol/L (ref 10.0–18.0)
BUN/Creatinine Ratio: 18 (ref 10–24)
BUN: 27 mg/dL (ref 8–27)
CO2: 23 mmol/L (ref 20–29)
Calcium: 9 mg/dL (ref 8.6–10.2)
Chloride: 103 mmol/L (ref 96–106)
Creatinine, Ser: 1.51 mg/dL — ABNORMAL HIGH (ref 0.76–1.27)
Glucose: 239 mg/dL — ABNORMAL HIGH (ref 70–99)
Potassium: 3.3 mmol/L — ABNORMAL LOW (ref 3.5–5.2)
Sodium: 143 mmol/L (ref 134–144)
eGFR: 47 mL/min/{1.73_m2} — ABNORMAL LOW (ref >59)

## 2021-09-08 NOTE — Progress Notes (Signed)
? ?CC: two week follow-up ? ?HPI: ? ?[name] is a 76 y.o. person with medical history as below presenting to [facility] for two week follow-up for hypokalemia. ? ?Please see problem-based list for further details, assessments, and plans. ? ?Past Medical History:  ?Diagnosis Date  ? Anemia 10/18/2014  ? Baseline about 12 and stable from 2010 to 2016. Colon 2009 in [place] (records cannot be obtained).  EGD Dr Benson Norway 2011 nl   ? Aortic atherosclerosis ([place]) 03/28/2020  ? Incidental finding on imaging CT   ? Atherosclerosis 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 ([place]) 12/28/2014  ? ABI Feb 2017 R 0.49; L 0.72 with diffuse dz ABI Aug 2017 R 0.49; L 0.69 ABI Jan 2018 R 0.61; L 0.73  ABI Feb 2019 R 0.55; L 0.71 Sees Dr Bridgett Larsson - recs ABI q 6 months  ? Chronic diastolic 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 ([place]) 10/18/2014  ? Noted ECHO 10/2014. Grade 2. EF 50-55%; echo repeated 2019, severe hypertrophy with elevated filling pressures  ? Chronic kidney disease, stage 3b ([place]) 03/09/2020  ? Dr. Justin Mend  Nephrologist, f/u Q 42M  ? Coronary artery disease without angina pectoris   ? Degloving injury of finger 03/28/2020  ? 10/21/19: copied from op note "1.  Open reduction percutaneous pinning left small finger proximal phalanx fracture 2.  Complex repair of laceration to left small finger 3 cm in length 3.  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 No significant callus formation is noted. Pins had been removed  ? Diverticulosis 10/08/2014  ? Seen on CT. Reportedly on Colon in [place] in 2009. Freq bouts of diverticulitis.  ? Dizziness due to orthostatic hypotensioni in setting of wt loss, resolved 01/12/2021  ? DM (diabetes mellitus), type 2 with renal complications ([place]) 74/25/9563  ? Former tobacco use 10/08/2014  ? Gout   ? H/O gastrointestinal diverticular hemorrhage 11/21/2020  ? Hx of recent  orthostatic hypotension (early summer 2022) 11/28/2020  ? 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 Baseline Cr about 1.5. Stable from 2010 to 2016.  Negative SPEP and UPEP 2014 after ARF 2/2 continued ACE (lisinopril 20) use while vol contracted. Dr Justin Mend  ? Obesity (BMI 30.0-34.9) 03/09/2020  ? 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 OSA (obstructive sleep apnea) 10/08/2014  ? July 2016 : Severe OSA/hypopnea syndrome, AHI 128.4, O2 nadir of 84% RA. Failed CPAP on study. BiPA inspiratory pressure of 21 and expiratory pressure of 17 CWP. Consider ENT evaluation for potentially correctable upper airway obstruction contributing to the need for unusually high pressures - ENT July 2015 did not feel any intervention surgically was indicated. He wore a large F&P Simplus fullfac  ? Personal history of colonic polyps 03/28/2014  ? Dr. Benson Norway, 2 polyps removed 2015.  No polyps on f/u in 2020.  No further surveillance suggested per Dr. Benson Norway.  ? 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 Chronic problem, evaluated by ENT in the past, with recommendation for surgery which she has been hesitant to consider.  He is unable to breathe through his nose comfortably.  This is part of the reason why he does not wear his oxygen regularly.  Nasal passages are nearly completely obstructed erythematous smooth glistening surface.  He has been told by his eye doctor that part of the reason his e  ? Resistant hypertension 10/09/2014  ?  Poor control with 6 drug therapy. 2016 : Aldo 37 but ARR 23.5   ? Severe pulmonary arterial systolic hypertension ([place]) 10/18/2014  ? Noted as severe ECHO 2016 and 2019. Likely 2/2 severe untreated OSA. Pt is not adherent to CPAP.  ? Syncope 12/31/2020  ? Thrombocytopenia ([place]) 10/09/2016  ? Nl liver and spleen on [place] 2019  ? Ulcer aphthous oral 04/27/2020  ? Evaluated emergency room last week, saw with Magic mouthwash  ? Unintentional weight loss due to Trulicity, resolved 1/47/0929   ? ?Review of Systems:  As per HPI ? ?Physical Exam: ? ?Vitals:  ? 09/06/21 1342 09/06/21 1354  ?BP: (!) 165/76 (!) 143/68  ?Pulse: 64 (!) 56  ?Resp: (!) 28   ?Temp: 98.5 ?F (36.9 ?C)   ?TempSrc: Oral   ?SpO2: 94%   ?Weight: 166 lb 1.6 oz (75.3 kg)   ?Height: _0  (1.676 m)   ? ?General: Resting comfortably in chair, no acute distress ?CV: Regular rate, rhythm. No murmurs appreciated. No JVD.  ?Pulm: Normal work of breathing on room air. Clear to ausculation bilaterally. ?MSK: Normal bulk, tone. No pitting edema bilateral lower extremities. ?Skin: Warm, dry. No rashes or lesions appreciated. ?Neuro: Awake, alert, conversing appropriately. ? ?Assessment & Plan:  ? ?See Encounters Tab for problem based charting. ? ?Patient discussed with Dr.  Saverio Danker ? ?

## 2021-09-08 NOTE — Assessment & Plan Note (Addendum)
Patient is presenting today for two-week follow-up after recent hospitalization for acute heart failure. During previous visit on 2/15, patient appeared euvolemic and was re-started on spironolactone. Today, patient reports that he feels "great" and is unsure why he has an appointment today. Reviewed medications with Mr. Bosserman today, as he brought them with him. Spironolactone was not in his bag, and he reports he did not know about a new medication during the last visit.  ? ?Mr. Lassen states his symptoms overall are much improved. He continues to have some dyspnea on exertion, but states he is able to work with physical therapy a few days weekly. Notes he was walking back and forth in his house without becoming dyspneic. However, he does report he is unable to walk to the mailbox due to dyspnea. He denies any recent chest pain, dyspnea at rest, or lower extremity edema. ? ?Today, patient's weight is unchanged from last visit (75.3kg) and appears euvolemic on exam. It appears he is experiencing likely NYHA class III symptoms. Discussed with Mr. Prabhakar re-starting spironolactone today, for which he was agreeable. We will also obtain repeat BMP today given hypokalemia seen on last visit. Patient was unable to go to cardiologist's office last month, but has rescheduled appt to April. ? ?- Repeat BMP today ?- Re-start spironolactone 33m daily ?- Follow-up with PCP on 3/16 ?- Follow-up with cardiologist in April ? ?Current GDMT: ?- Losartan 108mdaily ?- Coreg 2534mwice daily ?- Imdur 31m37mily ?- Spironolactone 25mg43mly ? ?ADDENDUM:  ?Potassium 3.3 today. With the addition of spironolactone, will hold off further potassium supplementation to avoid hyperkalemia. Will have him return to clinic on 3/16 with PCP to recheck labs and re-evaluate. ? ?

## 2021-09-10 ENCOUNTER — Encounter: Payer: Self-pay | Admitting: Student

## 2021-09-11 ENCOUNTER — Telehealth: Payer: Self-pay

## 2021-09-11 NOTE — Telephone Encounter (Signed)
Return call to [facility] with [facility] - no answer; left message to call the office if needed. ?

## 2021-09-11 NOTE — Telephone Encounter (Signed)
I s/w the pt today and he is agreeable to moving the appt up for pre op clearance. Pt has now been scheduled to see Dr. Audie Box 09/17/21 @ 8:30 am. I will update the requesting office the pt's appt has been moved up to 09/17/21.   ?

## 2021-09-11 NOTE — Telephone Encounter (Signed)
? ?  Pre-operative Risk Assessment  ?  ?Patient Name: Dylan [name]  ?DOB: 1945/12/30 ?MRN: [id number]  ? ?  ? ?Request for Surgical Clearance   ? ?Procedure:   LEFT EYE SURGERY ? ?Date of Surgery:  Clearance TBD                              ?   ?Surgeon:  Sherlynn Stalls, MD ?Surgeon's Group or Practice Name:  [facility] ?Phone number:  [phone] ?Fax number:  [phone] ?  ?Type of Clearance Requested:   ?- Medical  ?  ?Type of Anesthesia:  MAC ?  ?Additional requests/questions:   ? ? ? ?

## 2021-09-11 NOTE — Telephone Encounter (Signed)
Primary Cardiologist:Iola Doreatha Lew, MD ? ?Chart reviewed as part of pre-operative protocol coverage. Because of Dylan [name]'s past medical history and time since last visit, he/she will require a follow-up visit in order to better assess preoperative cardiovascular risk. ? ?Pre-op covering staff: ?- Please schedule appointment and call patient to inform them. Pt has appointment with Dr. Audie Box 4/17 ?- Please contact requesting surgeon's office via preferred method (i.e, phone, fax) to inform them of need for appointment prior to surgery. ? ?If applicable, this message will also be routed to pharmacy pool and/or primary cardiologist for input on holding anticoagulant/antiplatelet agent as requested below so that this information is available at time of patient's appointment.  ? ?Emmaline Life, NP-C ? ?  ?09/11/2021, 2:22 PM ?[place] ?[address] ?Office [phone] Fax [phone] ? ?

## 2021-09-11 NOTE — Telephone Encounter (Signed)
RN from  [facility]  is requesting a call back on pt .. she is in the home with pt right now .. she requested you call the pt number  ?

## 2021-09-11 NOTE — Progress Notes (Signed)
Internal Medicine Clinic Attending ? ?Case discussed with Dr. Collene Gobble  At the time of the visit.  We reviewed the resident?s history and exam and pertinent patient test results.  I agree with the assessment, diagnosis, and plan of care documented in the resident?s note.  ?

## 2021-09-16 NOTE — Progress Notes (Unsigned)
Cardiology Office Note:   Date:  09/16/2021  NAME:  Dylan [name]    MRN: [id number] DOB:  16-Mar-1946   PCP:  Angelica Pou, MD  Cardiologist:  Evalina Field, MD  Electrophysiologist:  None   Referring MD: Angelica Pou, MD   No chief complaint on file.  History of Present Illness:   Dylan [name] is a 76 y.o. male with a hx of HCM, CHF, pAF, DM, CKD, pHTN who presents for follow-up. Recent hospitalization for ADHF.   Problem List  Hypertrophic cardiomyopathy/Systolic HF -EF 99% echo 01/7413 -EF 25% on CMR (arrhythmia artifact?) 03/2021 -EF 35-40% 07/2021 -basal septum 21 mm -3% LGE 2. CAD -45% mLAD -85% D2 (08/09/2021) 3. Paroxysmal Afib/flutter 4. CHB detected on monitor -nocturnal 02/18/2021 @ 4:02 AM 5. Diabetes -A1c 7.5 6. HTN 7. CKD 3 8. Prior tobacco abuse -20 pack years  9. Pulmonary hypertension 10. OSA  Past Medical History: Past Medical History:  Diagnosis Date   Anemia 10/18/2014   Baseline about 12 and stable from 2010 to 2016. Colon 2009 in [place] (records cannot be obtained).  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 Grade 2. EF 50-55%; echo repeated 2019, severe hypertrophy with elevated filling pressures   Chronic kidney disease, stage 3b ([place]) 03/09/2020   Dr. Justin Mend  Nephrologist, f/u Q 61M   Coronary artery disease without angina pectoris    Degloving injury of finger 03/28/2020   10/21/19: copied from op note "1.  Open reduction percutaneous pinning left small finger proximal phalanx fracture 2.  Complex repair of  laceration to left small finger 3 cm in length 3.  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 No significant callus formation is noted. Pins had been removed   Diverticulosis 10/08/2014   Seen on CT. Reportedly on Colon in [place] in 2009. Freq bouts of diverticulitis.   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 Stable from 2010 to 2016.  Negative SPEP and UPEP 2014 after ARF 2/2 continued ACE (lisinopril 20) use while vol contracted. 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 Failed CPAP on study. BiPA inspiratory pressure of 21 and expiratory pressure of 17 CWP. Consider ENT evaluation for potentially correctable upper airway obstruction contributing to the need for unusually high pressures - ENT July 2015 did not feel any intervention surgically was indicated. He wore a large  F&P Simplus fullfac   Personal history of colonic polyps 03/28/2014   Dr. Benson Norway, 2 polyps removed 2015.  No polyps on f/u in 2020.  No further surveillance suggested per Dr. Benson Norway.   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  He is unable to breathe through his nose comfortably.  This is part of the reason why he does not wear his oxygen regularly.  Nasal passages are nearly  completely obstructed erythematous smooth glistening surface.  He has been told by his eye doctor that part of the reason his e   Resistant hypertension 10/09/2014   Poor control with 6 drug therapy. 2016 : Aldo 37 but ARR 23.5    Severe pulmonary arterial systolic hypertension ([place]) 10/18/2014   Noted as severe ECHO 2016 and 2019. Likely 2/2 severe untreated OSA. Pt is not adherent to CPAP.   Syncope 12/31/2020   Thrombocytopenia ([place]) 10/09/2016   Nl liver and spleen on [place] 2019   Ulcer aphthous oral 04/27/2020   Evaluated emergency room last week, saw with Magic mouthwash   Unintentional weight loss due to Trulicity, resolved 8/88/9169    Past Surgical History: Past Surgical History:  Procedure Laterality Date   CHOLECYSTECTOMY     COLONOSCOPY WITH PROPOFOL N/A 02/12/2019   Procedure: COLONOSCOPY WITH PROPOFOL;  Surgeon: Carol Ada, MD;  [facility];  Service: Endoscopy;  Laterality: N/A;   PERCUTANEOUS PINNING Left 10/21/2019   Procedure: CLOSED REDUCTION WITH PERCUTANEOUS PINNING AND SUTURE REPAIR OF LACERATION  OF SMALL FINGER,  SUTURE REPAIR OF LACERATION OF INDEX FINGER;  Surgeon: Cindra Presume, MD;  Location: [facility];  Service: Plastics;  Laterality: Left;   RIGHT/LEFT HEART CATH AND CORONARY ANGIOGRAPHY N/A 08/09/2021   Procedure: RIGHT/LEFT HEART CATH AND CORONARY ANGIOGRAPHY;  Surgeon: [place] Sine, MD;  Location: [facility];  Service: Cardiovascular;  Laterality: N/A;    Current Medications: No outpatient medications have been marked as taking for the 09/17/21 encounter (Appointment) with O'Neal, Cassie Freer, MD.     Allergies:    Ace inhibitors and Spironolactone   Social History: Social History   Socioeconomic History   Marital status: Divorced    Spouse name: Not on file   Number of children: 2   Years of education: Not on file   Highest education level: Not on file  Occupational History   Occupation: retired  Tobacco Use   Smoking  status: Former    Packs/day: 0.50    Years: 20.00    Pack years: 10.00    Types: Cigarettes    Quit date: 10/02/1972    Years since quitting: 48.9   Smokeless tobacco: Never  Vaping Use   Vaping Use: Never used  Substance and Sexual Activity   Alcohol use: No    Alcohol/week: 0.0 standard drinks   Drug use: No   Sexual activity: Never  Other Topics Concern   Not on file  Social History Narrative   Worked in Civil engineer, 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 Had yearly occupational testing inc PFT's. Now retired. Divorced. Has male sig other. Likes to go fishing.      He starting drinking as a teen and would drink to passing out. Couldn't keep job, have family. He quit drinking and smoking cold [place] with help of his faith. No ETOH since about age 71ish      Total of 7 brothers and  6 sisters.   Social Determinants of Health   Financial Resource Strain: Medium Risk   Difficulty of Paying Living Expenses: Somewhat hard  Food Insecurity: No Food Insecurity   Worried About Charity fundraiser in the Last Year: Never true   Ran Out of Food in the Last Year: Never true  Transportation Needs: No Transportation Needs   Lack of Transportation (Medical): No   Lack of Transportation (Non-Medical): No  Physical Activity: Not on file  Stress: Not on file  Social Connections: Not on file     Family History: The patient's family history includes CAD in his sister; Heart failure in his sister.  ROS:   All other ROS reviewed and negative. Pertinent positives noted in the HPI.     EKGs/Labs/Other Studies Reviewed:   The following studies were personally reviewed by me today:  EKG:  EKG is ordered today.  The ekg ordered today demonstrates Afib HR 77 bpm with RBBB/LAFB, and was personally reviewed by me.   TTE 08/05/2021  1. Left ventricular ejection fraction, by estimation, is 30 to 35%. The  left ventricle has moderately decreased function. The left ventricle has  no regional wall motion abnormalities. Left  ventricular diastolic  parameters are indeterminate. There is the   interventricular septum is flattened in systole and diastole, consistent  with right ventricular pressure and volume overload.   2. Right ventricular systolic function is severely reduced. The right  ventricular size is moderately enlarged. There is severely elevated  pulmonary artery systolic pressure. The estimated right ventricular  systolic pressure is 62.6 mmHg.   3. Left atrial size was severely dilated.   4. Right atrial size was severely dilated.   5. A small pericardial effusion is present. The pericardial effusion is  circumferential.   6. The mitral valve is normal in structure. Mild mitral valve  regurgitation. No evidence of mitral stenosis.   7. Tricuspid valve regurgitation is severe.   8. The aortic valve is normal in structure. There is moderate  calcification of the aortic valve. There is mild thickening of the aortic  valve. Aortic valve regurgitation is not visualized. Aortic valve  sclerosis/calcification is present, without any  evidence of aortic stenosis.   9. The inferior vena cava is dilated in size with <50% respiratory  variability, suggesting right atrial pressure of 15 mmHg.   Recent Labs: 12/31/2020: TSH 0.677 02/14/2021: ALT 12 08/03/2021: B Natriuretic Peptide 3,479.4 08/05/2021: Magnesium 1.8 08/10/2021: Hemoglobin 11.3; Platelets 141 09/06/2021: BUN 27; Creatinine, Ser 1.51; Potassium 3.3; Sodium 143   Recent Lipid Panel    Component Value Date/Time   CHOL 125 09/14/2020 1005   TRIG 57 09/14/2020 1005   HDL 38 (L) 09/14/2020 1005   CHOLHDL 3.3 09/14/2020 1005   CHOLHDL 4.6 01/12/2015 1032   VLDL 17 01/12/2015 1032   [place] 75 09/14/2020 1005    Physical Exam:   VS:  There were no vitals taken for this visit.   Wt Readings from Last 3 Encounters:  09/06/21 166 lb 1.6 oz (75.3 kg)  08/20/21 166 lb 11.2 oz (75.6 kg)  08/10/21 159 lb 6.3 oz (72.3 kg)    General: Well  nourished, well developed, in no acute distress Head: Atraumatic, normal size  Eyes: PEERLA, EOMI  Neck: Supple, no JVD Endocrine: No thryomegaly Cardiac: Normal S1, S2; RRR; no murmurs, rubs, or gallops Lungs: Clear to auscultation bilaterally, no wheezing, rhonchi or rales  Abd: Soft, nontender, no hepatomegaly  Ext: No edema, pulses  2+ Musculoskeletal: No deformities, BUE and BLE strength normal and equal Skin: Warm and dry, no rashes   Neuro: Alert and oriented to person, place, time, and situation, CNII-XII grossly intact, no focal deficits  Psych: Normal mood and affect   ASSESSMENT:   Dylan [name] is a 76 y.o. male who presents for the following: No diagnosis found.  PLAN:   There are no diagnoses linked to this encounter.  {Are you ordering a CV Procedure (e.g. stress test, cath, DCCV, TEE, etc)?   Press F2        :[id number]}  Disposition: No follow-ups on file.  Medication Adjustments/Labs and Tests Ordered: Current medicines are reviewed at length with the patient today.  Concerns regarding medicines are outlined above.  No orders of the defined types were placed in this encounter.  No orders of the defined types were placed in this encounter.   There are no Patient Instructions on file for this visit.   Time Spent with Patient: I have spent a total of *** minutes with patient reviewing hospital notes, telemetry, EKGs, labs and examining the patient as well as establishing an assessment and plan that was discussed with the patient.  > 50% of time was spent in direct patient care.  Signed, Addison Naegeli. Audie Box, MD, [facility]  [address] [place], [place] [address] [phone]  09/16/2021 4:14 PM

## 2021-09-17 ENCOUNTER — Other Ambulatory Visit: Payer: Self-pay

## 2021-09-17 ENCOUNTER — Ambulatory Visit (INDEPENDENT_AMBULATORY_CARE_PROVIDER_SITE_OTHER): Payer: Medicare HMO | Admitting: Cardiovascular Disease

## 2021-09-17 ENCOUNTER — Encounter: Payer: Self-pay | Admitting: Cardiovascular Disease

## 2021-09-17 VITALS — BP 140/80 | HR 77 | Ht 66.0 in | Wt 171.0 lb

## 2021-09-17 DIAGNOSIS — I5022 Chronic systolic (congestive) heart failure: Secondary | ICD-10-CM | POA: Diagnosis not present

## 2021-09-17 DIAGNOSIS — I4819 Other persistent atrial fibrillation: Secondary | ICD-10-CM | POA: Diagnosis not present

## 2021-09-17 DIAGNOSIS — Z01810 Encounter for preprocedural cardiovascular examination: Secondary | ICD-10-CM | POA: Diagnosis not present

## 2021-09-17 DIAGNOSIS — I422 Other hypertrophic cardiomyopathy: Secondary | ICD-10-CM | POA: Diagnosis not present

## 2021-09-17 DIAGNOSIS — I5032 Chronic diastolic (congestive) heart failure: Secondary | ICD-10-CM

## 2021-09-17 DIAGNOSIS — I442 Atrioventricular block, complete: Secondary | ICD-10-CM

## 2021-09-17 LAB — BASIC METABOLIC PANEL
BUN/Creatinine Ratio: 21 (ref 10–24)
BUN: 38 mg/dL — ABNORMAL HIGH (ref 8–27)
CO2: 22 mmol/L (ref 20–29)
Calcium: 9.4 mg/dL (ref 8.6–10.2)
Chloride: 107 mmol/L — ABNORMAL HIGH (ref 96–106)
Creatinine, Ser: 1.85 mg/dL — ABNORMAL HIGH (ref 0.76–1.27)
Glucose: 187 mg/dL — ABNORMAL HIGH (ref 70–99)
Potassium: 4.3 mmol/L (ref 3.5–5.2)
Sodium: 144 mmol/L (ref 134–144)
eGFR: 37 mL/min/{1.73_m2} — ABNORMAL LOW (ref >59)

## 2021-09-17 MED ORDER — SPIRONOLACTONE 25 MG PO TABS: 25.0000 mg | ORAL_TABLET | Freq: Every day | ORAL | 2 refills | Status: DC

## 2021-09-17 NOTE — Patient Instructions (Signed)
Medication Instructions:  ?Increase Lasix to 40 twice daily for 3 days, then decrease to once daily.  ?Make sure you are taking Aldactone  ?*If you need a refill on your cardiac medications before your next appointment, please call your pharmacy* ? ? ?Lab Work: ?BMET today  ? ?If you have labs (blood work) drawn today and your tests are completely normal, you will receive your results only by: ?MyChart Message (if you have MyChart) OR ?A paper copy in the mail ?If you have any lab test that is abnormal or we need to change your treatment, we will call you to review the results. ? ?Follow-Up: ?At [facility], you and your health needs are our priority.  As part of our continuing mission to provide you with exceptional heart care, we have created designated Provider Care Teams.  These Care Teams include your primary Cardiologist (physician) and Advanced Practice Providers (APPs -  Physician Assistants and Nurse Practitioners) who all work together to provide you with the care you need, when you need it. ? ?We recommend signing up for the patient portal called "MyChart".  Sign up information is provided on this After Visit Summary.  MyChart is used to connect with patients for Virtual Visits (Telemedicine).  Patients are able to view lab/test results, encounter notes, upcoming appointments, etc.  Non-urgent messages can be sent to your provider as well.   ?To learn more about what you can do with MyChart, go to [email or web address].   ? ?Your next appointment:   ?Keep appointment as scheduled in April  ? ?The format for your next appointment:   ?In Person ? ?Provider:   ?Evalina Field, MD   ? ? ? ?

## 2021-09-18 ENCOUNTER — Encounter: Payer: Self-pay | Admitting: Cardiovascular Disease

## 2021-09-20 ENCOUNTER — Encounter: Payer: Self-pay | Admitting: Internal Medicine

## 2021-09-20 ENCOUNTER — Encounter: Payer: Medicare HMO | Admitting: Internal Medicine

## 2021-09-25 ENCOUNTER — Telehealth: Payer: Self-pay | Admitting: *Deleted

## 2021-09-25 NOTE — Telephone Encounter (Signed)
Call from pt - stated he has diverticulitis and has been bleeding x 3 weeks. Stated he notices the blood when he goes to the bathroom. Also stated he was told by Dr Jimmye Norman and Dr Benson Norway he might have some bleeding but since it has been going on x 3 weeks and he's starting to feel slightly weak, he decide to call the office. I asked the pt to call Dr Ulyses Amor office ( telephone # given to pt) and to call our back if needed. Stated he will. ?

## 2021-10-09 ENCOUNTER — Other Ambulatory Visit: Payer: Self-pay

## 2021-10-09 ENCOUNTER — Emergency Department (HOSPITAL_COMMUNITY): Payer: Medicare HMO

## 2021-10-09 ENCOUNTER — Encounter (HOSPITAL_COMMUNITY): Payer: Self-pay

## 2021-10-09 ENCOUNTER — Inpatient Hospital Stay (HOSPITAL_COMMUNITY)
Admission: EM | Admit: 2021-10-09 | Discharge: 2021-10-17 | DRG: 378 | Disposition: A | Discharge status: Home Health | Payer: Medicare HMO | Attending: Internal Medicine | Admitting: Internal Medicine

## 2021-10-09 DIAGNOSIS — K2980 Duodenitis without bleeding: Secondary | ICD-10-CM | POA: Diagnosis present

## 2021-10-09 DIAGNOSIS — K922 Gastrointestinal hemorrhage, unspecified: Secondary | ICD-10-CM | POA: Diagnosis present

## 2021-10-09 DIAGNOSIS — K259 Gastric ulcer, unspecified as acute or chronic, without hemorrhage or perforation: Secondary | ICD-10-CM | POA: Diagnosis present

## 2021-10-09 DIAGNOSIS — K5731 Diverticulosis of large intestine without perforation or abscess with bleeding: Principal | ICD-10-CM | POA: Diagnosis present

## 2021-10-09 DIAGNOSIS — Z9049 Acquired absence of other specified parts of digestive tract: Secondary | ICD-10-CM

## 2021-10-09 DIAGNOSIS — I422 Other hypertrophic cardiomyopathy: Secondary | ICD-10-CM | POA: Diagnosis present

## 2021-10-09 DIAGNOSIS — Z7901 Long term (current) use of anticoagulants: Secondary | ICD-10-CM

## 2021-10-09 DIAGNOSIS — D696 Thrombocytopenia, unspecified: Secondary | ICD-10-CM | POA: Diagnosis present

## 2021-10-09 DIAGNOSIS — I421 Obstructive hypertrophic cardiomyopathy: Secondary | ICD-10-CM

## 2021-10-09 DIAGNOSIS — I3139 Other pericardial effusion (noninflammatory): Secondary | ICD-10-CM | POA: Diagnosis present

## 2021-10-09 DIAGNOSIS — D692 Other nonthrombocytopenic purpura: Secondary | ICD-10-CM | POA: Diagnosis present

## 2021-10-09 DIAGNOSIS — I4819 Other persistent atrial fibrillation: Secondary | ICD-10-CM | POA: Diagnosis present

## 2021-10-09 DIAGNOSIS — I5032 Chronic diastolic (congestive) heart failure: Secondary | ICD-10-CM

## 2021-10-09 DIAGNOSIS — E669 Obesity, unspecified: Secondary | ICD-10-CM | POA: Diagnosis present

## 2021-10-09 DIAGNOSIS — Z79899 Other long term (current) drug therapy: Secondary | ICD-10-CM

## 2021-10-09 DIAGNOSIS — G4733 Obstructive sleep apnea (adult) (pediatric): Secondary | ICD-10-CM | POA: Diagnosis present

## 2021-10-09 DIAGNOSIS — K7682 Hepatic encephalopathy: Secondary | ICD-10-CM | POA: Diagnosis present

## 2021-10-09 DIAGNOSIS — R791 Abnormal coagulation profile: Secondary | ICD-10-CM | POA: Diagnosis present

## 2021-10-09 DIAGNOSIS — I951 Orthostatic hypotension: Secondary | ICD-10-CM | POA: Diagnosis present

## 2021-10-09 DIAGNOSIS — Z9981 Dependence on supplemental oxygen: Secondary | ICD-10-CM

## 2021-10-09 DIAGNOSIS — I5042 Chronic combined systolic (congestive) and diastolic (congestive) heart failure: Secondary | ICD-10-CM | POA: Diagnosis present

## 2021-10-09 DIAGNOSIS — F05 Delirium due to known physiological condition: Secondary | ICD-10-CM | POA: Diagnosis not present

## 2021-10-09 DIAGNOSIS — D649 Anemia, unspecified: Principal | ICD-10-CM

## 2021-10-09 DIAGNOSIS — Z87891 Personal history of nicotine dependence: Secondary | ICD-10-CM

## 2021-10-09 DIAGNOSIS — J9611 Chronic respiratory failure with hypoxia: Secondary | ICD-10-CM | POA: Diagnosis present

## 2021-10-09 DIAGNOSIS — Z6827 Body mass index (BMI) 27.0-27.9, adult: Secondary | ICD-10-CM

## 2021-10-09 DIAGNOSIS — R188 Other ascites: Secondary | ICD-10-CM | POA: Diagnosis present

## 2021-10-09 DIAGNOSIS — Z8249 Family history of ischemic heart disease and other diseases of the circulatory system: Secondary | ICD-10-CM

## 2021-10-09 DIAGNOSIS — K7460 Unspecified cirrhosis of liver: Secondary | ICD-10-CM | POA: Diagnosis present

## 2021-10-09 DIAGNOSIS — I442 Atrioventricular block, complete: Secondary | ICD-10-CM | POA: Diagnosis present

## 2021-10-09 DIAGNOSIS — K2970 Gastritis, unspecified, without bleeding: Secondary | ICD-10-CM | POA: Diagnosis present

## 2021-10-09 DIAGNOSIS — N1832 Chronic kidney disease, stage 3b: Secondary | ICD-10-CM | POA: Diagnosis present

## 2021-10-09 DIAGNOSIS — I452 Bifascicular block: Secondary | ICD-10-CM | POA: Diagnosis present

## 2021-10-09 DIAGNOSIS — R21 Rash and other nonspecific skin eruption: Secondary | ICD-10-CM | POA: Diagnosis not present

## 2021-10-09 DIAGNOSIS — E1165 Type 2 diabetes mellitus with hyperglycemia: Secondary | ICD-10-CM | POA: Diagnosis present

## 2021-10-09 DIAGNOSIS — N179 Acute kidney failure, unspecified: Secondary | ICD-10-CM

## 2021-10-09 DIAGNOSIS — R001 Bradycardia, unspecified: Secondary | ICD-10-CM | POA: Diagnosis not present

## 2021-10-09 DIAGNOSIS — K625 Hemorrhage of anus and rectum: Secondary | ICD-10-CM | POA: Diagnosis not present

## 2021-10-09 DIAGNOSIS — Z7984 Long term (current) use of oral hypoglycemic drugs: Secondary | ICD-10-CM

## 2021-10-09 DIAGNOSIS — I700 Atherosclerosis of aorta: Secondary | ICD-10-CM | POA: Diagnosis present

## 2021-10-09 DIAGNOSIS — Z888 Allergy status to other drugs, medicaments and biological substances status: Secondary | ICD-10-CM

## 2021-10-09 DIAGNOSIS — I130 Hypertensive heart and chronic kidney disease with heart failure and stage 1 through stage 4 chronic kidney disease, or unspecified chronic kidney disease: Secondary | ICD-10-CM | POA: Diagnosis present

## 2021-10-09 DIAGNOSIS — D62 Acute posthemorrhagic anemia: Secondary | ICD-10-CM | POA: Diagnosis present

## 2021-10-09 DIAGNOSIS — I5043 Acute on chronic combined systolic (congestive) and diastolic (congestive) heart failure: Secondary | ICD-10-CM | POA: Diagnosis present

## 2021-10-09 DIAGNOSIS — I2510 Atherosclerotic heart disease of native coronary artery without angina pectoris: Secondary | ICD-10-CM | POA: Diagnosis present

## 2021-10-09 DIAGNOSIS — Z8719 Personal history of other diseases of the digestive system: Secondary | ICD-10-CM

## 2021-10-09 DIAGNOSIS — Z7982 Long term (current) use of aspirin: Secondary | ICD-10-CM

## 2021-10-09 DIAGNOSIS — Z91199 Patient's noncompliance with other medical treatment and regimen due to unspecified reason: Secondary | ICD-10-CM

## 2021-10-09 DIAGNOSIS — M109 Gout, unspecified: Secondary | ICD-10-CM | POA: Diagnosis present

## 2021-10-09 DIAGNOSIS — E1122 Type 2 diabetes mellitus with diabetic chronic kidney disease: Secondary | ICD-10-CM | POA: Diagnosis present

## 2021-10-09 DIAGNOSIS — I272 Pulmonary hypertension, unspecified: Secondary | ICD-10-CM | POA: Diagnosis present

## 2021-10-09 DIAGNOSIS — N400 Enlarged prostate without lower urinary tract symptoms: Secondary | ICD-10-CM | POA: Diagnosis present

## 2021-10-09 DIAGNOSIS — Z20822 Contact with and (suspected) exposure to covid-19: Secondary | ICD-10-CM | POA: Diagnosis present

## 2021-10-09 LAB — CBC
HCT: 24.9 % — ABNORMAL LOW (ref 39.0–52.0)
Hemoglobin: 7.2 g/dL — ABNORMAL LOW (ref 13.0–17.0)
MCH: 26.2 pg (ref 26.0–34.0)
MCHC: 28.9 g/dL — ABNORMAL LOW (ref 30.0–36.0)
MCV: 90.5 fL (ref 80.0–100.0)
Platelets: 99 10*3/uL — ABNORMAL LOW (ref 150–400)
RBC: 2.75 MIL/uL — ABNORMAL LOW (ref 4.22–5.81)
RDW: 17.6 % — ABNORMAL HIGH (ref 11.5–15.5)
WBC: 8 10*3/uL (ref 4.0–10.5)
nRBC: 0.4 % — ABNORMAL HIGH (ref 0.0–0.2)

## 2021-10-09 LAB — COMPREHENSIVE METABOLIC PANEL
ALT: 22 U/L (ref 0–44)
AST: 18 U/L (ref 15–41)
Albumin: 3.6 g/dL (ref 3.5–5.0)
Alkaline Phosphatase: 86 U/L (ref 38–126)
Anion gap: 9 (ref 5–15)
BUN: 49 mg/dL — ABNORMAL HIGH (ref 8–23)
CO2: 21 mmol/L — ABNORMAL LOW (ref 22–32)
Calcium: 9 mg/dL (ref 8.9–10.3)
Chloride: 112 mmol/L — ABNORMAL HIGH (ref 98–111)
Creatinine, Ser: 2.04 mg/dL — ABNORMAL HIGH (ref 0.61–1.24)
GFR, Estimated: 33 mL/min — ABNORMAL LOW (ref >60)
Glucose, Bld: 265 mg/dL — ABNORMAL HIGH (ref 70–99)
Potassium: 4 mmol/L (ref 3.5–5.1)
Sodium: 142 mmol/L (ref 135–145)
Total Bilirubin: 0.9 mg/dL (ref 0.3–1.2)
Total Protein: 6.6 g/dL (ref 6.5–8.1)

## 2021-10-09 LAB — PREPARE RBC (CROSSMATCH)

## 2021-10-09 LAB — BRAIN NATRIURETIC PEPTIDE: B Natriuretic Peptide: 2572.9 pg/mL — ABNORMAL HIGH (ref 0.0–100.0)

## 2021-10-09 LAB — RESP PANEL BY RT-PCR (FLU A&B, COVID) ARPGX2
Influenza A by PCR: NEGATIVE
Influenza B by PCR: NEGATIVE
SARS Coronavirus 2 by RT PCR: NEGATIVE

## 2021-10-09 LAB — POC OCCULT BLOOD, ED: Fecal Occult Bld: NEGATIVE

## 2021-10-09 MED ORDER — CARVEDILOL 12.5 MG PO TABS: 25.0000 mg | ORAL_TABLET | Freq: Two times a day (BID) | ORAL | Status: DC

## 2021-10-09 MED ORDER — ACETAMINOPHEN 325 MG PO TABS
650.0000 mg | ORAL_TABLET | Freq: Four times a day (QID) | ORAL | Status: DC | PRN
  Administered 2021-10-15: 650 mg via ORAL
  Filled 2021-10-09 (×3): qty 2

## 2021-10-09 MED ORDER — PANTOPRAZOLE 80MG IVPB - SIMPLE MED
80.0000 mg | Freq: Once | INTRAVENOUS | Status: AC
  Administered 2021-10-09: 80 mg via INTRAVENOUS
  Filled 2021-10-09: qty 80

## 2021-10-09 MED ORDER — HYDRALAZINE HCL 50 MG PO TABS
50.0000 mg | ORAL_TABLET | Freq: Three times a day (TID) | ORAL | Status: DC
  Administered 2021-10-09 – 2021-10-17 (×22): 50 mg via ORAL
  Filled 2021-10-09 (×4): qty 1
  Filled 2021-10-09: qty 2
  Filled 2021-10-09 (×14): qty 1
  Filled 2021-10-09: qty 2
  Filled 2021-10-09 (×4): qty 1

## 2021-10-09 MED ORDER — ISOSORBIDE MONONITRATE ER 60 MG PO TB24
60.0000 mg | ORAL_TABLET | Freq: Every day | ORAL | Status: DC
  Administered 2021-10-09 – 2021-10-17 (×9): 60 mg via ORAL
  Filled 2021-10-09: qty 2
  Filled 2021-10-09 (×8): qty 1

## 2021-10-09 MED ORDER — ACETAMINOPHEN 650 MG RE SUPP: 650.0000 mg | Freq: Four times a day (QID) | RECTAL | Status: DC | PRN

## 2021-10-09 MED ORDER — INSULIN ASPART 100 UNIT/ML IJ SOLN
0.0000 [IU] | Freq: Three times a day (TID) | INTRAMUSCULAR | Status: DC
  Administered 2021-10-10: 5 [IU] via SUBCUTANEOUS
  Administered 2021-10-11: 3 [IU] via SUBCUTANEOUS
  Administered 2021-10-11: 5 [IU] via SUBCUTANEOUS
  Administered 2021-10-11 – 2021-10-12 (×3): 2 [IU] via SUBCUTANEOUS
  Administered 2021-10-12 – 2021-10-13 (×2): 3 [IU] via SUBCUTANEOUS
  Administered 2021-10-13: 2 [IU] via SUBCUTANEOUS
  Administered 2021-10-13: 1 [IU] via SUBCUTANEOUS
  Administered 2021-10-14 (×2): 3 [IU] via SUBCUTANEOUS
  Administered 2021-10-14: 1 [IU] via SUBCUTANEOUS
  Administered 2021-10-15 (×3): 3 [IU] via SUBCUTANEOUS
  Administered 2021-10-16: 2 [IU] via SUBCUTANEOUS
  Administered 2021-10-16 – 2021-10-17 (×2): 3 [IU] via SUBCUTANEOUS
  Administered 2021-10-17: 2 [IU] via SUBCUTANEOUS

## 2021-10-09 MED ORDER — CARVEDILOL 25 MG PO TABS
25.0000 mg | ORAL_TABLET | Freq: Two times a day (BID) | ORAL | Status: DC
  Administered 2021-10-09 – 2021-10-13 (×7): 25 mg via ORAL
  Filled 2021-10-09 (×7): qty 1
  Filled 2021-10-09: qty 2

## 2021-10-09 MED ORDER — ATORVASTATIN CALCIUM 40 MG PO TABS
40.0000 mg | ORAL_TABLET | Freq: Every day | ORAL | Status: DC
  Administered 2021-10-09 – 2021-10-16 (×8): 40 mg via ORAL
  Filled 2021-10-09 (×8): qty 1

## 2021-10-09 MED ORDER — PANTOPRAZOLE SODIUM 40 MG IV SOLR
40.0000 mg | Freq: Two times a day (BID) | INTRAVENOUS | Status: DC
  Administered 2021-10-10: 40 mg via INTRAVENOUS
  Filled 2021-10-09: qty 10

## 2021-10-09 MED ORDER — SODIUM CHLORIDE 0.9 % IV SOLN
10.0000 mL/h | Freq: Once | INTRAVENOUS | Status: AC
  Administered 2021-10-09: 10 mL/h via INTRAVENOUS

## 2021-10-09 NOTE — ED Triage Notes (Signed)
Pt arrived POV from home c/o a low hemoglobin and bloody stools. Pt denies any pain. Per pt's family member drs office called stating his hemoglobin dropped from 11 to 6.  ?

## 2021-10-09 NOTE — Consult Note (Signed)
Reason for Consult: Anemia and melena ?Referring Physician: [facility] ? ?Canary Brim ?HPI: This is a 76 year old male with a PMH pan diverticula, CKD, chronic anemia, OSA, CHF, and chronic oxygen use admitted for a symptomatic anemia.  The patient reports having melena for the past 3 weeks.  His last melenic bowel movement was this past Saturday.  He felt weaker over the intervening days.  Yesterday, in the office he was evaluated and he appeared ill and weak.  The patient was recommended to present to the ER, but he did not want to go.  Blood work was obtained as a compromise and it showed that his HGB was 6.9 g/dL in the office, which is a drop from 11.3 g/dL on 08/2021.  His SOB worsened, but denied any chest pain.  His last colonoscopy was on 02/12/2019 for complaints of hematochezia.  Scatter pandiverticula were found and there was no evidence of any active bleeding.  This time he feels that he has more melena.  A hemoccult was performed in the ER and he was negative for any blood. ? ?Past Medical History:  ?Diagnosis Date  ? Anemia 10/18/2014  ? Baseline about 12 and stable from 2010 to 2016. Colon 2009 in [place] (records cannot be obtained).  EGD Dr Benson Norway 2011 nl   ? Aortic atherosclerosis ([place]) 03/28/2020  ? Incidental finding on imaging CT   ? Atherosclerosis 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 ([place]) 12/28/2014  ? ABI Feb 2017 R 0.49; L 0.72 with diffuse dz ABI Aug 2017 R 0.49; L 0.69 ABI Jan 2018 R 0.61; L 0.73  ABI Feb 2019 R 0.55; L 0.71 Sees Dr Bridgett Larsson - recs ABI q 6 months  ? Chronic diastolic 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 ([place]) 10/18/2014  ? Noted ECHO 10/2014. Grade 2. EF 50-55%; echo repeated 2019, severe hypertrophy with elevated filling pressures  ? Chronic kidney disease, stage 3b ([place]) 03/09/2020  ? Dr. Justin Mend  Nephrologist, f/u Q 5M  ? Coronary artery disease without angina pectoris   ? Degloving injury of finger 03/28/2020  ? 10/21/19: copied from  op note "1.  Open reduction percutaneous pinning left small finger proximal phalanx fracture 2.  Complex repair of laceration to left small finger 3 cm in length 3.  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 No significant callus formation is noted. Pins had been removed  ? Diverticulosis 10/08/2014  ? Seen on CT. Reportedly on Colon in [place] in 2009. Freq bouts of diverticulitis.  ? Dizziness due to orthostatic hypotensioni in setting of wt loss, resolved 01/12/2021  ? DM (diabetes mellitus), type 2 with renal complications ([place]) 54/00/8676  ? Former tobacco use 10/08/2014  ? Gout   ? H/O gastrointestinal diverticular hemorrhage 11/21/2020  ? Hx of recent orthostatic hypotension (early summer 2022) 11/28/2020  ? 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 Baseline Cr about 1.5. Stable from 2010 to 2016.  Negative SPEP and UPEP 2014 after ARF 2/2 continued ACE (lisinopril 20) use while vol contracted. Dr Justin Mend  ? Obesity (BMI 30.0-34.9) 03/09/2020  ? 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 OSA (obstructive sleep apnea) 10/08/2014  ? July 2016 : Severe OSA/hypopnea syndrome, AHI 128.4, O2 nadir of 84% RA. Failed CPAP on study. BiPA inspiratory pressure of 21 and expiratory pressure of 17 CWP. Consider ENT evaluation for potentially correctable  upper airway obstruction contributing to the need for unusually high pressures - ENT July 2015 did not feel any intervention surgically was indicated. He wore a large F&P Simplus fullfac  ? Personal history of colonic polyps 03/28/2014  ? Dr. Benson Norway, 2 polyps removed 2015.  No polyps on f/u in 2020.  No further surveillance suggested per Dr. Benson Norway.  ? 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 Chronic problem, evaluated by ENT in the past, with recommendation for surgery which she has been hesitant to consider.  He is unable to breathe through his  nose comfortably.  This is part of the reason why he does not wear his oxygen regularly.  Nasal passages are nearly completely obstructed erythematous smooth glistening surface.  He has been told by his eye doctor that part of the reason his e  ? Resistant hypertension 10/09/2014  ? Poor control with 6 drug therapy. 2016 : Aldo 37 but ARR 23.5   ? Severe pulmonary arterial systolic hypertension ([place]) 10/18/2014  ? Noted as severe ECHO 2016 and 2019. Likely 2/2 severe untreated OSA. Pt is not adherent to CPAP.  ? Syncope 12/31/2020  ? Thrombocytopenia ([place]) 10/09/2016  ? Nl liver and spleen on [place] 2019  ? Ulcer aphthous oral 04/27/2020  ? Evaluated emergency room last week, saw with Magic mouthwash  ? Unintentional weight loss due to Trulicity, resolved 1/59/4707  ? ? ?Past Surgical History:  ?Procedure Laterality Date  ? CHOLECYSTECTOMY    ? COLONOSCOPY WITH PROPOFOL N/A 02/12/2019  ? Procedure: COLONOSCOPY WITH PROPOFOL;  Surgeon: Carol Ada, MD;  [facility];  Service: Endoscopy;  Laterality: N/A;  ? PERCUTANEOUS PINNING Left 10/21/2019  ? Procedure: CLOSED REDUCTION WITH PERCUTANEOUS PINNING AND SUTURE REPAIR OF LACERATION  OF SMALL FINGER,  SUTURE REPAIR OF LACERATION OF INDEX FINGER;  Surgeon: Cindra Presume, MD;  Location: [facility];  Service: Plastics;  Laterality: Left;  ? RIGHT/LEFT HEART CATH AND CORONARY ANGIOGRAPHY N/A 08/09/2021  ? Procedure: RIGHT/LEFT HEART CATH AND CORONARY ANGIOGRAPHY;  Surgeon: [place] Sine, MD;  Location: [facility];  Service: Cardiovascular;  Laterality: N/A;  ? ? ?Family History  ?Problem Relation Age of Onset  ? Heart failure Sister   ?     Died of complications Nov 6151  ? CAD Sister   ?     CABG x3 while in her 61's  ? ? ?Social History:  reports that he quit smoking about 49 years ago. His smoking use included cigarettes. He has a 10.00 pack-year smoking history. He has never used smokeless tobacco. He reports that he does not drink alcohol and does not use  drugs. ? ?Allergies:  ?Allergies  ?Allergen Reactions  ? Ace Inhibitors Other (See Comments)  ?  "ARF - see CRF overview"  ? Spironolactone Other (See Comments)  ?  Gynecomastia per pt report  ? ? ?Medications: Scheduled: ?Continuous: ? sodium chloride    ? pantoprazole    ? ? ?Results for orders placed or performed during the hospital encounter of 10/09/21 (from the past 24 hour(s))  ?Comprehensive metabolic panel     Status: Abnormal  ? Collection Time: 10/09/21  2:10 PM  ?Result Value Ref Range  ? Sodium 142 135 - 145 mmol/L  ? Potassium 4.0 3.5 - 5.1 mmol/L  ? Chloride 112 (H) 98 - 111 mmol/L  ? CO2 21 (L) 22 - 32 mmol/L  ? Glucose, Bld 265 (H) 70 - 99 mg/dL  ? BUN 49 (H)  8 - 23 mg/dL  ? Creatinine, Ser 2.04 (H) 0.61 - 1.24 mg/dL  ? Calcium 9.0 8.9 - 10.3 mg/dL  ? Total Protein 6.6 6.5 - 8.1 g/dL  ? Albumin 3.6 3.5 - 5.0 g/dL  ? AST 18 15 - 41 U/L  ? ALT 22 0 - 44 U/L  ? Alkaline Phosphatase 86 38 - 126 U/L  ? Total Bilirubin 0.9 0.3 - 1.2 mg/dL  ? GFR, Estimated 33 (L) >60 mL/min  ? Anion gap 9 5 - 15  ?CBC     Status: Abnormal  ? Collection Time: 10/09/21  2:10 PM  ?Result Value Ref Range  ? WBC 8.0 4.0 - 10.5 K/uL  ? RBC 2.75 (L) 4.22 - 5.81 MIL/uL  ? Hemoglobin 7.2 (L) 13.0 - 17.0 g/dL  ? HCT 24.9 (L) 39.0 - 52.0 %  ? MCV 90.5 80.0 - 100.0 fL  ? MCH 26.2 26.0 - 34.0 pg  ? MCHC 28.9 (L) 30.0 - 36.0 g/dL  ? RDW 17.6 (H) 11.5 - 15.5 %  ? Platelets 99 (L) 150 - 400 K/uL  ? nRBC 0.4 (H) 0.0 - 0.2 %  ?Type and screen [place]     Status: None (Preliminary result)  ? Collection Time: 10/09/21  2:10 PM  ?Result Value Ref Range  ? ABO/RH(D) O POS   ? Antibody Screen NEG   ? Sample Expiration    ?  10/12/2021,2359 ?Performed at [facility], [place] [address] [place], [place] [address] ?  ? Unit Number [id number]   ? Blood Component Type RED CELLS,LR   ? Unit division 00   ? Status of Unit ALLOCATED   ? Transfusion Status OK TO TRANSFUSE   ? Crossmatch Result Compatible   ? Unit Number  [id number]   ? Blood Component Type RBC LR PHER2   ? Unit division 00   ? Status of Unit ALLOCATED   ? Transfusion Status OK TO TRANSFUSE   ? Crossmatch Result Compatible   ?POC occult blood, ED     Statu

## 2021-10-09 NOTE — ED Provider Triage Note (Signed)
Emergency Medicine Provider Triage Evaluation Note ? ?Dylan [name] , a 76 y.o. male  was evaluated in triage.  Pt is accompanied by daughter who states that patient saw his gastroenterologist yesterday for bloody stools.  He had a CBC drawn yesterday which returned today was 6.9.  Gastroenterologist referred him to the ED for evaluation and likely blood transfusion.  Patient himself is feeling fine aside from abdominal distention/bloating that started 3 days ago.  He denies weakness, fatigue, palpitations. ?Review of Systems  ?Positive:  ?Negative:  ? ?Physical Exam  ?BP 130/72 (BP Location: Right Arm)   Pulse 73   Temp 98.9 ?F (37.2 ?C) (Oral)   Resp 16   Ht _0  (1.676 m)   Wt 78 kg   SpO2 92%   BMI 27.75 kg/m?  ?Gen:   Awake, no distress   ?Resp:  Normal effort  ?MSK:   Moves extremities without difficulty  ?Other:  Abdomen is distended, firm, nontender to palpation ? ?Medical Decision Making  ?Medically screening exam initiated at 3:03 PM.  Appropriate orders placed.  Dylan [name] was informed that the remainder of the evaluation will be completed by another provider, this initial triage assessment does not replace that evaluation, and the importance of remaining in the ED until their evaluation is complete. ? ? ?  ?Tonye Pearson, [place] ?10/09/21 1526 ? ?

## 2021-10-09 NOTE — ED Notes (Signed)
Pt reports increased shortness of breath, doesn't recall prior hx of anemia. He reports that he does have A-fib and takes eliquis  ?

## 2021-10-09 NOTE — Hospital Course (Addendum)
Symptomatic anemia likely 2/2 GI bleed ?PPI qd ?Pathology collected follow-up with GI ? ?Abnormal INR ?

## 2021-10-09 NOTE — H&P (Addendum)
? ? ? ?Date: 10/09/2021     ?     ?     ?Patient Name:  Dylan [name] MRN: [id number]  ?DOB: 01-Feb-1946 Age / Sex: 76 y.o., male   ?PCP: Angelica Pou, MD    ?     ?Medical Service: Internal Medicine Teaching Service    ?     ?Attending Physician: Dr. Charise Killian, MD    ?First Contact: Dr. Johnney Ou Pager: [phone]  ?Second Contact: Dr. Howie Ill Pager: [phone]  ?     ?After Hours (After 5p/  First Contact Pager: [phone]  ?weekends / holidays): Second Contact Pager: [phone]  ? ?Chief Complaint: Hematochezia ? ?History of Present Illness: Dylan [name] is a 76 year old male with a history of colonic diverticula, CKD stage III, HTN, chronic anemia, chronic hypoxic respiratory failure on 3 L, gout, non-insulin dependent T2DM, HFrEF and A-fib on Eliquis presented complaining of blood in stool for the past 3 weeks.  Patient states he was last seen by his gastroenterologist 3 months ago and discussion was had regarding chronic rectal bleeding associated with history of diverticulitis.  Patient was fully informed that he may experience intermittent rectal bleeding as a result.  He stated that for the last 3 weeks he would experience frank blood in stool.  He would have about 1 bowel movement each day.  Unable to quantify amount of blood, however states dark [employer] blood that would cover the toilet bowl.  He denies constipation or diarrhea.  States he takes MiraLAX daily with regular bowel movements.  He states he has experienced bloody stools in the past, most recently 1 year ago which self resolved.  Patient denies hematuria, hemoptysis, or any source of bleeding other than in the stool.  Patient is on Eliquis and states compliance with medication.  However he did not take his Eliquis or other medications today.  What prompted ED visit, patient was seen by his gastroenterologist a few days ago and lab was ordered.  He then states he received a call stating that his blood counts were low and to seek medical  attention.  He endorses his rectal bleeding stopped this past Saturday.  However, he began to be fatigued, dizzy and lightheaded.  Overall feeling weak.  No recent falls or trauma as result.  At baseline, patient uses a walker to ambulate.  He endorses shortness of breath but denies chest pain.  Denies abdominal pain but endorses bloating.  Denies nausea or vomiting.  Denies early satiety or changes in appetite. ? ?ED course: Labs significant for hemoglobin of 7.2.  Patient received 1 unit of PRBC.  CT scan of the abdomen reveal colonic diverticuli of the sigmoid and descending colon without evidence of bowel obstruction or inflammation.  Fecal occult negative.  Chest x-ray increased opacities of the left lung base compared to previous.  Left effusion obscuring left hemidiaphragmatic contour.  GI consulted and evaluated patient. ? ? ?Meds:  ?Metformin 591m bid ?Coreg 239mBID ?Hydralazine 5055mID ?Eliquis 5mg49mD ?ASA 81mg40mtorvastatin 40mg 46mLasix 40mg q19mllopurinol 100mg qd8mdur 60mg qd 46msartan 100mg qd  54moutpatient medications have been marked as taking for the 10/09/21 encounter ([facility]).  ? ? ? ?Allergies: ?Allergies as of 10/09/2021 - Review Complete 10/09/2021  ?Allergen Reaction Noted  ? Ace inhibitors Other (See Comments) 10/03/2017  ? Spironolactone Other (See Comments) 10/19/2014  ? ?Past Medical History:  ?Diagnosis Date  ? Anemia 10/18/2014  ? Baseline about  12 and stable from 2010 to 2016. Colon 2009 in [place] (records cannot be obtained).  EGD Dr Benson Norway 2011 nl   ? Aortic atherosclerosis ([place]) 03/28/2020  ? Incidental finding on imaging CT   ? Atherosclerosis 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 ([place]) 12/28/2014  ? ABI Feb 2017 R 0.49; L 0.72 with diffuse dz ABI Aug 2017 R 0.49; L 0.69 ABI Jan 2018 R 0.61; L 0.73  ABI Feb 2019 R 0.55; L 0.71 Sees Dr Bridgett Larsson - recs ABI q 6 months  ? 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 Noted ECHO 10/2014. Grade 2. EF 50-55%; echo repeated 2019, severe hypertrophy with elevated filling pressures  ? Chronic kidney disease, stage 3b ([place]) 03/09/2020  ? Dr. Justin Mend  Nephrologist, f/u Q 64M  ? Coronary artery disease without angina pectoris   ? Degloving injury of finger 03/28/2020  ? 10/21/19: copied from op note "1.  Open reduction percutaneous pinning left small finger proximal phalanx fracture 2.  Complex repair of laceration to left small finger 3 cm in length 3.  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 No significant callus formation is noted. Pins had been removed  ? Diverticulosis 10/08/2014  ? Seen on CT. Reportedly on Colon in [place] in 2009. Freq bouts of diverticulitis.  ? Dizziness due to orthostatic hypotensioni in setting of wt loss, resolved 01/12/2021  ? DM (diabetes mellitus), type 2 with renal complications ([place]) 13/24/4010  ? Former tobacco use 10/08/2014  ? Gout   ? H/O gastrointestinal diverticular hemorrhage 11/21/2020  ? Hx of recent orthostatic hypotension (early summer 2022) 11/28/2020  ? 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 Baseline Cr about 1.5. Stable from 2010 to 2016.  Negative SPEP and UPEP 2014 after ARF 2/2 continued ACE (lisinopril 20) use while vol contracted. Dr Justin Mend  ? Obesity (BMI 30.0-34.9) 03/09/2020  ? 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 OSA (obstructive sleep apnea) 10/08/2014  ? July 2016 : Severe OSA/hypopnea syndrome, AHI 128.4, O2 nadir of 84% RA. Failed CPAP on study. BiPA inspiratory pressure of 21 and expiratory pressure of 17 CWP. Consider ENT evaluation for potentially correctable upper airway obstruction contributing to the need for unusually high pressures - ENT July 2015 did not feel any intervention surgically was indicated. He wore a large F&P Simplus fullfac  ? Personal history of colonic polyps  03/28/2014  ? Dr. Benson Norway, 2 polyps removed 2015.  No polyps on f/u in 2020.  No further surveillance suggested per Dr. Benson Norway.  ? 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 Chronic problem, evaluated by ENT in the past, with recommendation for surgery which she has been hesitant to consider.  He is unable to breathe through his nose comfortably.  This is part of the reason why he does not wear his oxygen regularly.  Nasal passages are nearly completely obstructed erythematous smooth glistening surface.  He has been told by his eye doctor that part of the reason his e  ? Resistant hypertension 10/09/2014  ? Poor control with 6 drug therapy. 2016 : Aldo 37 but ARR 23.5   ? Severe pulmonary arterial systolic hypertension ([place]) 10/18/2014  ? Noted as severe ECHO 2016 and 2019. Likely 2/2 severe untreated OSA. Pt is not adherent to CPAP.  ? Syncope 12/31/2020  ? Thrombocytopenia ([place]) 10/09/2016  ?  Nl liver and spleen on [place] 2019  ? Ulcer aphthous oral 04/27/2020  ? Evaluated emergency room last week, saw with Magic mouthwash  ? Unintentional weight loss due to Trulicity, resolved 2/82/0601  ? ? ?Family History: Mother and father deceased ? ?Social History: Smoked half a pack of cigarettes from age 31 to age 64.  Heavily drank alcohol remotely in his late teens to late 61s.  Endorses remote history of intermittent cocaine use and marijuana use.  Denies any other illicit drug use.  Currently does not take any illicit drugs or alcohol. ? ?Review of Systems: ?A complete ROS was negative except as per HPI.  ? ?Physical Exam: ?Blood pressure (!) 184/114, pulse 72, temperature 97.7 ?F (36.5 ?C), temperature source Oral, resp. rate (!) 24, height _0  (1.676 m), weight 78 kg, SpO2 92 %. ?Physical Exam ?Constitutional:   ?   General: He is not in acute distress. ?   Interventions: Nasal cannula in place.  ?HENT:  ?   Head: Normocephalic and atraumatic.  ?Neck:  ?   Vascular: No JVD.  ?Cardiovascular:  ?   Rate and Rhythm:  Normal rate.  ?   Heart sounds: Normal heart sounds.  ?Pulmonary:  ?   Effort: Pulmonary effort is normal.  ?   Breath sounds: No wheezing, rhonchi or rales.  ?Abdominal:  ?   General: Abdomen is protuberant. Bowel so

## 2021-10-09 NOTE — ED Provider Notes (Signed)
?[facility] ?Provider Note ? ? ?CSN: [id number] ?Arrival date & time: 10/09/21  1259 ? ?  ?History ? ?Chief Complaint  ?Patient presents with  ? Rectal Bleeding  ? abnormal labs  ? ? ?Dylan [name] is a 76 y.o. male on chronic anticoagulation, chronic hypoxic respiratory failure  here for evaluation of rectal bleeding and generalized weakness. Patient states he has had dark and bright red bleed in his stool intermittently over the last 3 weeks. Feels very fatigued, lightheadedness with ambulation. No HA, CP, SOB, increase in LE edema. Has some intermittent abd pain (feels like diverticulitis? Per patient) Feels like abd is bloated. No recent sick contacts. ? ?See by Dr. Benson Norway (GI) noted to have anemia, sent here for transfusion and admission for scope. ? ? ? ? ?HPI ? ?  ? ?Home Medications ?Prior to Admission medications   ?Medication Sig Start Date End Date Taking? Authorizing Provider  ?allopurinol (ZYLOPRIM) 100 MG tablet Take 1 tablet (100 mg total) by mouth daily. 12/11/20   Angelica Pou, MD  ?apixaban (ELIQUIS) 5 MG TABS tablet Take 1 tablet (5 mg total) by mouth 2 (two) times daily. 08/31/21   Angelica Pou, MD  ?aspirin EC 81 MG tablet Take 81 mg by mouth daily. Swallow whole.    [provider]  ?atorvastatin (LIPITOR) 40 MG tablet Take 1 tablet (40 mg total) by mouth daily. 02/21/21   Angelica Pou, MD  ?carboxymethylcellulose (REFRESH PLUS) 0.5 % SOLN Place 1 drop into both eyes 3 (three) times daily as needed (dry eyes).    [provider]  ?carvedilol (COREG) 25 MG tablet Take 1 tablet (25 mg total) by mouth 2 (two) times daily with a meal. 04/30/21   Virl Axe, MD  ?fluticasone (FLONASE) 50 MCG/ACT nasal spray Place 2 sprays into both nostrils daily. 04/30/21   Virl Axe, MD  ?FREESTYLE LITE test strip Use to check blood sugar 3 times daily, DIAG CODE E11.29, INSULIN DEPENDENT 06/08/21   Angelica Pou, MD   ?furosemide (LASIX) 40 MG tablet Take 1 tablet (40 mg total) by mouth daily. 08/31/21 02/27/22  Angelica Pou, MD  ?hydrALAZINE (APRESOLINE) 50 MG tablet Take 1 tablet (50 mg total) by mouth 3 (three) times daily. 08/31/21   Angelica Pou, MD  ?isosorbide mononitrate (IMDUR) 60 MG 24 hr tablet Take 1 tablet (60 mg total) by mouth daily. 08/31/21   Angelica Pou, MD  ?latanoprost (XALATAN) 0.005 % ophthalmic solution SMARTSIG:In Eye(s) 01/12/21   [provider]  ?loratadine (CLARITIN) 10 MG tablet Take 10 mg by mouth daily.    [provider]  ?losartan (COZAAR) 100 MG tablet Take 1 tablet (100 mg total) by mouth daily. 07/16/21 07/16/22  O'NealCassie Freer, MD  ?METFORMIN HCL PO Take 500 mg by mouth in the morning and at bedtime.    [provider]  ?polyethylene glycol (MIRALAX / GLYCOLAX) packet Take 17 g by mouth daily as needed (constipation).     [provider]  ?spironolactone (ALDACTONE) 25 MG tablet Take 1 tablet (25 mg total) by mouth daily. 09/17/21 10/17/21  O'NealCassie Freer, MD  ?   ? ?Allergies    ?Ace inhibitors and Spironolactone   ? ?Review of Systems   ?Review of Systems  ?Constitutional:  Positive for fatigue.  ?HENT: Negative.    ?Respiratory: Negative.    ?Cardiovascular: Negative.   ?Gastrointestinal:  Positive for abdominal distention, abdominal pain and blood  in stool. Negative for anal bleeding, constipation, diarrhea, nausea, rectal pain and vomiting.  ?Musculoskeletal: Negative.   ?Skin: Negative.   ?Neurological:  Negative for weakness.  ?All other systems reviewed and are negative. ? ?Physical Exam ?Updated Vital Signs ?BP (!) 152/99   Pulse 64   Temp 98.7 ?F (37.1 ?C) (Oral)   Resp (!) 22   Ht _0  (1.676 m)   Wt 78 kg   SpO2 100%   BMI 27.75 kg/m?  ?Physical Exam ?Vitals and nursing note reviewed. Exam conducted with a chaperone present.  ?Constitutional:   ?   General: He is not in acute distress. ?   Appearance: He is  well-developed. He is not ill-appearing, toxic-appearing or diaphoretic.  ?HENT:  ?   Head: Normocephalic and atraumatic.  ?Eyes:  ?   Extraocular Movements: Extraocular movements intact.  ?   Pupils: Pupils are equal, round, and reactive to light.  ?   Comments: Pale conjunctiva   ?Cardiovascular:  ?   Rate and Rhythm: Normal rate and regular rhythm.  ?   Pulses:     ?     Radial pulses are 2+ on the right side and 2+ on the left side.  ?     Dorsalis pedis pulses are 1+ on the right side and 1+ on the left side.  ?   Heart sounds: Normal heart sounds.  ?Pulmonary:  ?   Effort: Pulmonary effort is normal. No respiratory distress.  ?   Breath sounds: Normal breath sounds and air entry.  ?   Comments: Speaks in full sentences without difficulty, on 3 L [place] at baseline ?Chest:  ?   Comments: Non tender ?Abdominal:  ?   General: Bowel sounds are normal. There is no distension.  ?   Palpations: Abdomen is soft.  ?   Tenderness: There is generalized abdominal tenderness.  ?   Hernia: A hernia is present. Hernia is present in the umbilical area.  ?   Comments: Distended, mild tenderness. Umbilical hernia, non tender without overlying skin changes  ?Genitourinary: ?   Comments: Tech in room for exam. Light brown stool in rectal vault ?Musculoskeletal:     ?   General: Normal range of motion.  ?   Cervical back: Normal range of motion and neck supple.  ?   Comments: Full ROM, compartments soft  ?Skin: ?   General: Skin is warm and dry.  ?   Capillary Refill: Capillary refill takes less than 2 seconds.  ?Neurological:  ?   General: No focal deficit present.  ?   Mental Status: He is alert and oriented to person, place, and time.  ? ?ED Results / Procedures / Treatments   ?Labs ?(all labs ordered are listed, but only abnormal results are displayed) ?Labs Reviewed  ?COMPREHENSIVE METABOLIC PANEL - Abnormal; Notable for the following components:  ?    Result Value  ? Chloride 112 (*)   ? CO2 21 (*)   ? Glucose, Bld 265 (*)   ?  BUN 49 (*)   ? Creatinine, Ser 2.04 (*)   ? GFR, Estimated 33 (*)   ? All other components within normal limits  ?CBC - Abnormal; Notable for the following components:  ? RBC 2.75 (*)   ? Hemoglobin 7.2 (*)   ? HCT 24.9 (*)   ? MCHC 28.9 (*)   ? RDW 17.6 (*)   ? Platelets 99 (*)   ? nRBC 0.4 (*)   ?  All other components within normal limits  ?RESP PANEL BY RT-PCR (FLU A&B, COVID) [id number]  ?BRAIN NATRIURETIC PEPTIDE  ?POC OCCULT BLOOD, ED  ?TYPE AND SCREEN  ?PREPARE RBC (CROSSMATCH)  ? ? ?EKG ?EKG Interpretation ? ?Date/Time:  Tuesday October 09 2021 17:07:21 EDT ?Ventricular Rate:  72 ?PR Interval:    ?QRS Duration: 159 ?QT Interval:  458 ?QTC Calculation: 502 ?R Axis:   0 ?Text Interpretation: Atrial fibrillation Right bundle branch block Confirmed by Dene Gentry [phone]) on 10/09/2021 5:12:38 PM ? ?Radiology ?CT ABDOMEN PELVIS WO CONTRAST ? ?Result Date: 10/09/2021 ?CLINICAL DATA:  76 year old with abdominal pain, acute, nonlocalized. Rectal bleeding. EXAM: CT ABDOMEN AND PELVIS WITHOUT CONTRAST TECHNIQUE: Multidetector CT imaging of the abdomen and pelvis was performed following the standard protocol without IV contrast. RADIATION DOSE REDUCTION: This exam was performed according to the departmental dose-optimization program which includes automated exposure control, adjustment of the mA and/or kV according to patient size and/or use of iterative reconstruction technique. COMPARISON:  CT chest abdomen pelvis 01/01/2021 FINDINGS: Lower chest: Heart is slightly enlarged with small to moderate sized pericardial effusion. Small bilateral pleural effusions. Compressive atelectasis in left lower lobe. Hepatobiliary: Perihepatic ascites is new. Cholecystectomy. Again noted is a 1.4 cm hypodensity in the posterior left hepatic lobe on sequence 3, image 21. This likely represents a cyst based on the Hounsfield units. Limited evaluation of the liver without intravascular contrast and motion artifact. Pancreas: Unremarkable. No  pancreatic ductal dilatation or surrounding inflammatory changes. Spleen: Normal in size without focal abnormality. Adrenals/Urinary Tract: Adrenal glands are within normal limits. Normal appearance of the kidneys without

## 2021-10-09 NOTE — H&P (View-Only) (Signed)
Reason for Consult: Anemia and melena ?Referring Physician: [facility] ? ?Janos J Burnsworth ?HPI: This is a 75 year old male with a PMH pan diverticula, CKD, chronic anemia, OSA, CHF, and chronic oxygen use admitted for a symptomatic anemia.  The patient reports having melena for the past 3 weeks.  His last melenic bowel movement was this past Saturday.  He felt weaker over the intervening days.  Yesterday, in the office he was evaluated and he appeared ill and weak.  The patient was recommended to present to the ER, but he did not want to go.  Blood work was obtained as a compromise and it showed that his HGB was 6.9 g/dL in the office, which is a drop from 11.3 g/dL on 08/2021.  His SOB worsened, but denied any chest pain.  His last colonoscopy was on 02/12/2019 for complaints of hematochezia.  Scatter pandiverticula were found and there was no evidence of any active bleeding.  This time he feels that he has more melena.  A hemoccult was performed in the ER and he was negative for any blood. ? ?Past Medical History:  ?Diagnosis Date  ? Anemia 10/18/2014  ? Baseline about 12 and stable from 2010 to 2016. Colon 2009 in [place] (records cannot be obtained).  EGD Dr Makinzi Prieur 2011 nl   ? Aortic atherosclerosis ([name]) 03/28/2020  ? Incidental finding on imaging CT   ? Atherosclerosis 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 ([name]) 12/28/2014  ? ABI Feb 2017 R 0.49; L 0.72 with diffuse dz ABI Aug 2017 R 0.49; L 0.69 ABI Jan 2018 R 0.61; L 0.73  ABI Feb 2019 R 0.55; L 0.71 Sees Dr Chen - recs ABI q 6 months  ? 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 Noted ECHO 10/2014. Grade 2. EF 50-55%; echo repeated 2019, severe hypertrophy with elevated filling pressures  ? Chronic kidney disease, stage 3b ([name]) 03/09/2020  ? Dr. Webb  Nephrologist, f/u Q 6M  ? Coronary artery disease without angina pectoris   ? Degloving injury of finger 03/28/2020  ? 10/21/19: copied from  op note "1.  Open reduction percutaneous pinning left small finger proximal phalanx fracture 2.  Complex repair of laceration to left small finger 3 cm in length 3.  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 No significant callus formation is noted. Pins had been removed  ? Diverticulosis 10/08/2014  ? Seen on CT. Reportedly on Colon in [place] in 2009. Freq bouts of diverticulitis.  ? Dizziness due to orthostatic hypotensioni in setting of wt loss, resolved 01/12/2021  ? DM (diabetes mellitus), type 2 with renal complications ([name]) 10/09/2014  ? Former tobacco use 10/08/2014  ? Gout   ? H/O gastrointestinal diverticular hemorrhage 11/21/2020  ? Hx of recent orthostatic hypotension (early summer 2022) 11/28/2020  ? 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 Baseline Cr about 1.5. Stable from 2010 to 2016.  Negative SPEP and UPEP 2014 after ARF 2/2 continued ACE (lisinopril 20) use while vol contracted. Dr Webb  ? Obesity (BMI 30.0-34.9) 03/09/2020  ? 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 OSA (obstructive sleep apnea) 10/08/2014  ? July 2016 : Severe OSA/hypopnea syndrome, AHI 128.4, O2 nadir of 84% RA. Failed CPAP on study. BiPA inspiratory pressure of 21 and expiratory pressure of 17 CWP. Consider ENT evaluation for potentially correctable   upper airway obstruction contributing to the need for unusually high pressures - ENT July 2015 did not feel any intervention surgically was indicated. He wore a large F&P Simplus fullfac  ? Personal history of colonic polyps 03/28/2014  ? Dr. Elasia Furnish, 2 polyps removed 2015.  No polyps on f/u in 2020.  No further surveillance suggested per Dr. Hannibal Skalla.  ? 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 Chronic problem, evaluated by ENT in the past, with recommendation for surgery which she has been hesitant to consider.  He is unable to breathe through his  nose comfortably.  This is part of the reason why he does not wear his oxygen regularly.  Nasal passages are nearly completely obstructed erythematous smooth glistening surface.  He has been told by his eye doctor that part of the reason his e  ? Resistant hypertension 10/09/2014  ? Poor control with 6 drug therapy. 2016 : Aldo 37 but ARR 23.5   ? Severe pulmonary arterial systolic hypertension ([name]) 10/18/2014  ? Noted as severe ECHO 2016 and 2019. Likely 2/2 severe untreated OSA. Pt is not adherent to CPAP.  ? Syncope 12/31/2020  ? Thrombocytopenia ([name]) 10/09/2016  ? Nl liver and spleen on US 2019  ? Ulcer aphthous oral 04/27/2020  ? Evaluated emergency room last week, saw with Magic mouthwash  ? Unintentional weight loss due to Trulicity, resolved 11/28/2020  ? ? ?Past Surgical History:  ?Procedure Laterality Date  ? CHOLECYSTECTOMY    ? COLONOSCOPY WITH PROPOFOL N/A 02/12/2019  ? Procedure: COLONOSCOPY WITH PROPOFOL;  Surgeon: Yurika Pereda, MD;  Location: [facility];  Service: Endoscopy;  Laterality: N/A;  ? PERCUTANEOUS PINNING Left 10/21/2019  ? Procedure: CLOSED REDUCTION WITH PERCUTANEOUS PINNING AND SUTURE REPAIR OF LACERATION  OF SMALL FINGER,  SUTURE REPAIR OF LACERATION OF INDEX FINGER;  Surgeon: Pace, Collier S, MD;  Location: [facility] OR;  Service: Plastics;  Laterality: Left;  ? RIGHT/LEFT HEART CATH AND CORONARY ANGIOGRAPHY N/A 08/09/2021  ? Procedure: RIGHT/LEFT HEART CATH AND CORONARY ANGIOGRAPHY;  Surgeon: Kelly, Thomas A, MD;  Location: [facility];  Service: Cardiovascular;  Laterality: N/A;  ? ? ?Family History  ?Problem Relation Age of Onset  ? Heart failure Sister   ?     Died of complications Nov 2017  ? CAD Sister   ?     CABG x3 while in her 40's  ? ? ?Social History:  reports that he quit smoking about 49 years ago. His smoking use included cigarettes. He has a 10.00 pack-year smoking history. He has never used smokeless tobacco. He reports that he does not drink alcohol and does not use  drugs. ? ?Allergies:  ?Allergies  ?Allergen Reactions  ? Ace Inhibitors Other (See Comments)  ?  "ARF - see CRF overview"  ? Spironolactone Other (See Comments)  ?  Gynecomastia per pt report  ? ? ?Medications: Scheduled: ?Continuous: ? sodium chloride    ? pantoprazole    ? ? ?Results for orders placed or performed during the hospital encounter of 10/09/21 (from the past 24 hour(s))  ?Comprehensive metabolic panel     Status: Abnormal  ? Collection Time: 10/09/21  2:10 PM  ?Result Value Ref Range  ? Sodium 142 135 - 145 mmol/L  ? Potassium 4.0 3.5 - 5.1 mmol/L  ? Chloride 112 (H) 98 - 111 mmol/L  ? CO2 21 (L) 22 - 32 mmol/L  ? Glucose, Bld 265 (H) 70 - 99 mg/dL  ? BUN 49 (H)   8 - 23 mg/dL  ? Creatinine, Ser 2.04 (H) 0.61 - 1.24 mg/dL  ? Calcium 9.0 8.9 - 10.3 mg/dL  ? Total Protein 6.6 6.5 - 8.1 g/dL  ? Albumin 3.6 3.5 - 5.0 g/dL  ? AST 18 15 - 41 U/L  ? ALT 22 0 - 44 U/L  ? Alkaline Phosphatase 86 38 - 126 U/L  ? Total Bilirubin 0.9 0.3 - 1.2 mg/dL  ? GFR, Estimated 33 (L) >60 mL/min  ? Anion gap 9 5 - 15  ?CBC     Status: Abnormal  ? Collection Time: 10/09/21  2:10 PM  ?Result Value Ref Range  ? WBC 8.0 4.0 - 10.5 K/uL  ? RBC 2.75 (L) 4.22 - 5.81 MIL/uL  ? Hemoglobin 7.2 (L) 13.0 - 17.0 g/dL  ? HCT 24.9 (L) 39.0 - 52.0 %  ? MCV 90.5 80.0 - 100.0 fL  ? MCH 26.2 26.0 - 34.0 pg  ? MCHC 28.9 (L) 30.0 - 36.0 g/dL  ? RDW 17.6 (H) 11.5 - 15.5 %  ? Platelets 99 (L) 150 - 400 K/uL  ? nRBC 0.4 (H) 0.0 - 0.2 %  ?Type and screen [facility]     Status: None (Preliminary result)  ? Collection Time: 10/09/21  2:10 PM  ?Result Value Ref Range  ? ABO/RH(D) O POS   ? Antibody Screen NEG   ? Sample Expiration    ?  10/12/2021,2359 ?Performed at [facility], [address] [place], [place] [address] ?  ? Unit Number [id number]   ? Blood Component Type RED CELLS,LR   ? Unit division 00   ? Status of Unit ALLOCATED   ? Transfusion Status OK TO TRANSFUSE   ? Crossmatch Result Compatible   ? Unit Number  [id number]   ? Blood Component Type RBC LR PHER2   ? Unit division 00   ? Status of Unit ALLOCATED   ? Transfusion Status OK TO TRANSFUSE   ? Crossmatch Result Compatible   ?POC occult blood, ED     Statu

## 2021-10-10 ENCOUNTER — Observation Stay (HOSPITAL_COMMUNITY): Payer: Medicare HMO | Admitting: Certified Registered Nurse Anesthetist

## 2021-10-10 ENCOUNTER — Encounter (HOSPITAL_COMMUNITY)
Admission: EM | Disposition: A | Discharge status: Home Health | Payer: Self-pay | Source: Home / Self Care | Attending: Internal Medicine

## 2021-10-10 ENCOUNTER — Encounter (HOSPITAL_COMMUNITY): Payer: Self-pay | Admitting: Internal Medicine

## 2021-10-10 DIAGNOSIS — I4891 Unspecified atrial fibrillation: Secondary | ICD-10-CM

## 2021-10-10 DIAGNOSIS — K5731 Diverticulosis of large intestine without perforation or abscess with bleeding: Secondary | ICD-10-CM | POA: Diagnosis present

## 2021-10-10 DIAGNOSIS — K2991 Gastroduodenitis, unspecified, with bleeding: Secondary | ICD-10-CM | POA: Diagnosis not present

## 2021-10-10 DIAGNOSIS — E1122 Type 2 diabetes mellitus with diabetic chronic kidney disease: Secondary | ICD-10-CM | POA: Diagnosis present

## 2021-10-10 DIAGNOSIS — K254 Chronic or unspecified gastric ulcer with hemorrhage: Secondary | ICD-10-CM | POA: Diagnosis not present

## 2021-10-10 DIAGNOSIS — D696 Thrombocytopenia, unspecified: Secondary | ICD-10-CM | POA: Diagnosis present

## 2021-10-10 DIAGNOSIS — D62 Acute posthemorrhagic anemia: Secondary | ICD-10-CM

## 2021-10-10 DIAGNOSIS — K922 Gastrointestinal hemorrhage, unspecified: Secondary | ICD-10-CM

## 2021-10-10 DIAGNOSIS — K7682 Hepatic encephalopathy: Secondary | ICD-10-CM | POA: Diagnosis present

## 2021-10-10 DIAGNOSIS — D692 Other nonthrombocytopenic purpura: Secondary | ICD-10-CM | POA: Diagnosis present

## 2021-10-10 DIAGNOSIS — I4819 Other persistent atrial fibrillation: Secondary | ICD-10-CM | POA: Diagnosis present

## 2021-10-10 DIAGNOSIS — I452 Bifascicular block: Secondary | ICD-10-CM | POA: Diagnosis present

## 2021-10-10 DIAGNOSIS — J9611 Chronic respiratory failure with hypoxia: Secondary | ICD-10-CM | POA: Diagnosis present

## 2021-10-10 DIAGNOSIS — E669 Obesity, unspecified: Secondary | ICD-10-CM | POA: Diagnosis present

## 2021-10-10 DIAGNOSIS — R188 Other ascites: Secondary | ICD-10-CM | POA: Diagnosis present

## 2021-10-10 DIAGNOSIS — I442 Atrioventricular block, complete: Secondary | ICD-10-CM | POA: Diagnosis present

## 2021-10-10 DIAGNOSIS — Z20822 Contact with and (suspected) exposure to covid-19: Secondary | ICD-10-CM | POA: Diagnosis present

## 2021-10-10 DIAGNOSIS — I5032 Chronic diastolic (congestive) heart failure: Secondary | ICD-10-CM | POA: Diagnosis not present

## 2021-10-10 DIAGNOSIS — I422 Other hypertrophic cardiomyopathy: Secondary | ICD-10-CM | POA: Diagnosis present

## 2021-10-10 DIAGNOSIS — I5043 Acute on chronic combined systolic (congestive) and diastolic (congestive) heart failure: Secondary | ICD-10-CM | POA: Diagnosis present

## 2021-10-10 DIAGNOSIS — F05 Delirium due to known physiological condition: Secondary | ICD-10-CM | POA: Diagnosis not present

## 2021-10-10 DIAGNOSIS — K625 Hemorrhage of anus and rectum: Secondary | ICD-10-CM | POA: Diagnosis present

## 2021-10-10 DIAGNOSIS — N1832 Chronic kidney disease, stage 3b: Secondary | ICD-10-CM | POA: Diagnosis present

## 2021-10-10 DIAGNOSIS — I272 Pulmonary hypertension, unspecified: Secondary | ICD-10-CM | POA: Diagnosis present

## 2021-10-10 DIAGNOSIS — I700 Atherosclerosis of aorta: Secondary | ICD-10-CM | POA: Diagnosis present

## 2021-10-10 DIAGNOSIS — N179 Acute kidney failure, unspecified: Secondary | ICD-10-CM | POA: Diagnosis present

## 2021-10-10 DIAGNOSIS — I2510 Atherosclerotic heart disease of native coronary artery without angina pectoris: Secondary | ICD-10-CM | POA: Diagnosis not present

## 2021-10-10 DIAGNOSIS — K5791 Diverticulosis of intestine, part unspecified, without perforation or abscess with bleeding: Secondary | ICD-10-CM | POA: Diagnosis not present

## 2021-10-10 DIAGNOSIS — I3139 Other pericardial effusion (noninflammatory): Secondary | ICD-10-CM | POA: Diagnosis present

## 2021-10-10 DIAGNOSIS — K7460 Unspecified cirrhosis of liver: Secondary | ICD-10-CM | POA: Diagnosis present

## 2021-10-10 DIAGNOSIS — K259 Gastric ulcer, unspecified as acute or chronic, without hemorrhage or perforation: Secondary | ICD-10-CM | POA: Diagnosis present

## 2021-10-10 DIAGNOSIS — I130 Hypertensive heart and chronic kidney disease with heart failure and stage 1 through stage 4 chronic kidney disease, or unspecified chronic kidney disease: Secondary | ICD-10-CM | POA: Diagnosis present

## 2021-10-10 HISTORY — PX: BIOPSY: SHX5522

## 2021-10-10 HISTORY — PX: ESOPHAGOGASTRODUODENOSCOPY (EGD) WITH PROPOFOL: SHX5813

## 2021-10-10 LAB — TYPE AND SCREEN
ABO/RH(D): O POS
Antibody Screen: NEGATIVE
Unit division: 0
Unit division: 0

## 2021-10-10 LAB — BASIC METABOLIC PANEL
Anion gap: 7 (ref 5–15)
BUN: 42 mg/dL — ABNORMAL HIGH (ref 8–23)
CO2: 19 mmol/L — ABNORMAL LOW (ref 22–32)
Calcium: 8.4 mg/dL — ABNORMAL LOW (ref 8.9–10.3)
Chloride: 114 mmol/L — ABNORMAL HIGH (ref 98–111)
Creatinine, Ser: 1.69 mg/dL — ABNORMAL HIGH (ref 0.61–1.24)
GFR, Estimated: 41 mL/min — ABNORMAL LOW (ref >60)
Glucose, Bld: 163 mg/dL — ABNORMAL HIGH (ref 70–99)
Potassium: 3.4 mmol/L — ABNORMAL LOW (ref 3.5–5.1)
Sodium: 140 mmol/L (ref 135–145)

## 2021-10-10 LAB — URINALYSIS, ROUTINE W REFLEX MICROSCOPIC
Bilirubin Urine: NEGATIVE
Glucose, UA: NEGATIVE mg/dL
Hgb urine dipstick: NEGATIVE
Ketones, ur: NEGATIVE mg/dL
Leukocytes,Ua: NEGATIVE
Nitrite: NEGATIVE
Protein, ur: NEGATIVE mg/dL
Specific Gravity, Urine: 1.013 (ref 1.005–1.030)
pH: 5 (ref 5.0–8.0)

## 2021-10-10 LAB — BPAM RBC
Blood Product Expiration Date: 202305032359
Blood Product Expiration Date: 202305042359
ISSUE DATE / TIME: 202304041623
ISSUE DATE / TIME: 202304041959
Unit Type and Rh: 5100
Unit Type and Rh: 5100

## 2021-10-10 LAB — CBG MONITORING, ED
Glucose-Capillary: 159 mg/dL — ABNORMAL HIGH (ref 70–99)
Glucose-Capillary: 176 mg/dL — ABNORMAL HIGH (ref 70–99)

## 2021-10-10 LAB — MAGNESIUM: Magnesium: 1.8 mg/dL (ref 1.7–2.4)

## 2021-10-10 LAB — GLUCOSE, CAPILLARY
Glucose-Capillary: 154 mg/dL — ABNORMAL HIGH (ref 70–99)
Glucose-Capillary: 147 mg/dL — ABNORMAL HIGH (ref 70–99)
Glucose-Capillary: 269 mg/dL — ABNORMAL HIGH (ref 70–99)
Glucose-Capillary: 280 mg/dL — ABNORMAL HIGH (ref 70–99)

## 2021-10-10 LAB — PROTIME-INR
INR: 2.1 — ABNORMAL HIGH (ref 0.8–1.2)
Prothrombin Time: 23.1 seconds — ABNORMAL HIGH (ref 11.4–15.2)

## 2021-10-10 LAB — CBC
HCT: 29.6 % — ABNORMAL LOW (ref 39.0–52.0)
Hemoglobin: 8.7 g/dL — ABNORMAL LOW (ref 13.0–17.0)
MCH: 26.2 pg (ref 26.0–34.0)
MCHC: 29.4 g/dL — ABNORMAL LOW (ref 30.0–36.0)
MCV: 89.2 fL (ref 80.0–100.0)
Platelets: 92 10*3/uL — ABNORMAL LOW (ref 150–400)
RBC: 3.32 MIL/uL — ABNORMAL LOW (ref 4.22–5.81)
RDW: 16.9 % — ABNORMAL HIGH (ref 11.5–15.5)
WBC: 8.3 10*3/uL (ref 4.0–10.5)
nRBC: 0.4 % — ABNORMAL HIGH (ref 0.0–0.2)

## 2021-10-10 LAB — CREATININE, URINE, RANDOM: Creatinine, Urine: 97.32 mg/dL

## 2021-10-10 SURGERY — ESOPHAGOGASTRODUODENOSCOPY (EGD) WITH PROPOFOL
Anesthesia: Monitor Anesthesia Care

## 2021-10-10 MED ORDER — APIXABAN 5 MG PO TABS
5.0000 mg | ORAL_TABLET | Freq: Two times a day (BID) | ORAL | Status: DC
  Administered 2021-10-11 – 2021-10-12 (×3): 5 mg via ORAL
  Filled 2021-10-10 (×4): qty 1

## 2021-10-10 MED ORDER — FUROSEMIDE 40 MG PO TABS
40.0000 mg | ORAL_TABLET | Freq: Every day | ORAL | Status: DC
  Administered 2021-10-10: 40 mg via ORAL
  Filled 2021-10-10: qty 1

## 2021-10-10 MED ORDER — PROPOFOL 10 MG/ML IV BOLUS
INTRAVENOUS | Status: DC | PRN
  Administered 2021-10-10: 10 mg via INTRAVENOUS

## 2021-10-10 MED ORDER — POTASSIUM CHLORIDE 20 MEQ PO PACK: 40.0000 meq | PACK | Freq: Two times a day (BID) | ORAL | Status: DC

## 2021-10-10 MED ORDER — POTASSIUM CHLORIDE CRYS ER 20 MEQ PO TBCR
40.0000 meq | EXTENDED_RELEASE_TABLET | Freq: Once | ORAL | Status: AC
  Administered 2021-10-10: 40 meq via ORAL
  Filled 2021-10-10: qty 2

## 2021-10-10 MED ORDER — POTASSIUM CHLORIDE 10 MEQ/100ML IV SOLN
10.0000 meq | INTRAVENOUS | Status: AC
  Administered 2021-10-10 (×2): 10 meq via INTRAVENOUS
  Filled 2021-10-10 (×2): qty 100

## 2021-10-10 MED ORDER — ALLOPURINOL 100 MG PO TABS
100.0000 mg | ORAL_TABLET | Freq: Every day | ORAL | Status: DC
  Administered 2021-10-10 – 2021-10-17 (×8): 100 mg via ORAL
  Filled 2021-10-10 (×9): qty 1

## 2021-10-10 MED ORDER — ASPIRIN EC 81 MG PO TBEC
81.0000 mg | DELAYED_RELEASE_TABLET | Freq: Every day | ORAL | Status: DC
  Administered 2021-10-10 – 2021-10-17 (×8): 81 mg via ORAL
  Filled 2021-10-10 (×9): qty 1

## 2021-10-10 MED ORDER — PROPOFOL 500 MG/50ML IV EMUL
INTRAVENOUS | Status: DC | PRN
Start: 2021-10-10 — End: 2021-10-10
  Administered 2021-10-10: 100 ug/kg/min via INTRAVENOUS

## 2021-10-10 MED ORDER — MAGNESIUM SULFATE 2 GM/50ML IV SOLN
2.0000 g | Freq: Once | INTRAVENOUS | Status: AC
  Administered 2021-10-10: 2 g via INTRAVENOUS
  Filled 2021-10-10: qty 50

## 2021-10-10 MED ORDER — SODIUM CHLORIDE 0.9 % IV SOLN: INTRAVENOUS | Status: DC | PRN

## 2021-10-10 MED ORDER — SODIUM CHLORIDE 0.9 % IV SOLN: INTRAVENOUS | Status: DC

## 2021-10-10 MED ORDER — SPIRONOLACTONE 25 MG PO TABS
25.0000 mg | ORAL_TABLET | Freq: Every day | ORAL | Status: DC
  Administered 2021-10-10 – 2021-10-11 (×2): 25 mg via ORAL
  Filled 2021-10-10 (×2): qty 1

## 2021-10-10 MED ORDER — SODIUM CHLORIDE 0.9 % IV SOLN
510.0000 mg | Freq: Once | INTRAVENOUS | Status: AC
  Administered 2021-10-10: 510 mg via INTRAVENOUS
  Filled 2021-10-10: qty 17

## 2021-10-10 MED ORDER — LIDOCAINE 2% (20 MG/ML) 5 ML SYRINGE
INTRAMUSCULAR | Status: DC | PRN
  Administered 2021-10-10: 60 mg via INTRAVENOUS

## 2021-10-10 MED ORDER — PANTOPRAZOLE SODIUM 40 MG PO TBEC
40.0000 mg | DELAYED_RELEASE_TABLET | Freq: Every day | ORAL | Status: DC
Start: 2021-10-10 — End: 2021-10-17
  Administered 2021-10-10 – 2021-10-17 (×8): 40 mg via ORAL
  Filled 2021-10-10 (×8): qty 1

## 2021-10-10 SURGICAL SUPPLY — 15 items

## 2021-10-10 NOTE — Anesthesia Preprocedure Evaluation (Addendum)
Anesthesia Evaluation  ?Patient identified by MRN, date of birth, ID band ?Patient awake ? ? ? ?Reviewed: ?Allergy & Precautions, Patient's Chart, lab work & pertinent test results, reviewed documented beta blocker date and time  ? ?Airway ?Mallampati: II ? ?TM Distance: >3 FB ? ? ? ? Dental ?  ?Pulmonary ?sleep apnea (noncompliant with CPAP) , former smoker,  ?  ?breath sounds clear to auscultation ? ? ? ? ? ? Cardiovascular ?hypertension, Pt. on home beta blockers and Pt. on medications ?pulmonary hypertension+ CAD and + Peripheral Vascular Disease  ?+ dysrhythmias Atrial Fibrillation  ?Rhythm:Regular Rate:Normal ? ?EKG Afib, RBBB ? ?TTE 2023 ??1. Left ventricular ejection fraction, by estimation, is 30 to 35%. The  ?left ventricle has moderately decreased function. The left ventricle has  ?no regional wall motion abnormalities. Left ventricular diastolic  ?parameters are indeterminate. There is the  ??interventricular septum is flattened in systole and diastole, consistent  ?with right ventricular pressure and volume overload.  ??2. Right ventricular systolic function is severely reduced. The right  ?ventricular size is moderately enlarged. There is severely elevated  ?pulmonary artery systolic pressure. The estimated right ventricular  ?systolic pressure is 61.6 mmHg.  ??3. Left atrial size was severely dilated.  ??4. Right atrial size was severely dilated.  ??5. A small pericardial effusion is present. The pericardial effusion is  ?circumferential.  ??6. The mitral valve is normal in structure. Mild mitral valve  ?regurgitation. No evidence of mitral stenosis.  ??7. Tricuspid valve regurgitation is severe.  ??8. The aortic valve is normal in structure. There is moderate  ?calcification of the aortic valve. There is mild thickening of the aortic  ?valve. Aortic valve regurgitation is not visualized. Aortic valve  ?sclerosis/calcification is present, without any  ?evidence of  aortic stenosis.  ??9. The inferior vena cava is dilated in size with <50% respiratory  ?variability, suggesting right atrial pressure of 15 mmHg.  ? ?Cath 2023 ?Single-vessel coronary obstructive disease with 45% mid LAD stenosis in the region of the takeoff of a small second diagonal vessel which had 85% ostial/proximal stenosis ?Normal left circumflex and RCA. ?Moderate pulmonary hypertension with mean PA at 36 mm. ?PVR: 6.0 WU ? ?  ?Neuro/Psych ?negative neurological ROS ? negative psych ROS  ? GI/Hepatic ?negative GI ROS, Neg liver ROS,   ?Endo/Other  ?diabetes, Type 2, Oral Hypoglycemic Agents ? Renal/GU ?Renal InsufficiencyRenal diseaseLab Results ?     Component                Value               Date                 ?     CREATININE               1.69 (H)            10/10/2021           ?     BUN                      42 (H)              10/10/2021           ?     NA                       140  10/10/2021           ?     K                        3.4 (L)             10/10/2021           ?     CL                       114 (H)             10/10/2021           ?     CO2                      19 (L)              10/10/2021           ?  ?negative genitourinary ?  ?Musculoskeletal ?negative musculoskeletal ROS ?(+)  ? Abdominal ?  ?Peds ? Hematology ? ?(+) Blood dyscrasia (on eliquis), anemia ,   ?Anesthesia Other Findings ? ? Reproductive/Obstetrics ? ?  ? ? ? ? ? ? ? ? ? ? ? ? ? ?  ?  ? ? ? ? ? ? ? ?Anesthesia Physical ?Anesthesia Plan ? ?ASA: 3 ? ?Anesthesia Plan: MAC  ? ?Post-op Pain Management:   ? ?Induction: Intravenous ? ?PONV Risk Score and Plan: Propofol infusion and Treatment may vary due to age or medical condition ? ?Airway Management Planned: Natural Airway, Simple Face Mask and Nasal Cannula ? ?Additional Equipment:  ? ?Intra-op Plan:  ? ?Post-operative Plan:  ? ?Informed Consent: I have reviewed the patients History and Physical, chart, labs and discussed the procedure including the  risks, benefits and alternatives for the proposed anesthesia with the patient or authorized representative who has indicated his/her understanding and acceptance.  ? ? ? ?Dental advisory given ? ?Plan Discussed with: CRNA and Anesthesiologist ? ?Anesthesia Plan Comments:   ? ? ? ? ? ?Anesthesia Quick Evaluation ? ?

## 2021-10-10 NOTE — ED Notes (Signed)
CBG is 159. ?

## 2021-10-10 NOTE — Discharge Summary (Addendum)
? ?Name: Dylan [name] ?MRN: [id number] ?DOB: 10/23/1945 76 y.o. ?PCP: Angelica Pou, MD ? ?Date of Admission: 10/09/2021  1:43 PM ?Date of Discharge: 10/17/2021 ?Attending Physician: Dylan Killian, MD ? ?Discharge Diagnosis: ?1.  Symptomatic anemia ?2.  Lower GI bleed due to diverticulosis ?3.  Acute on chronic CKD stage IIIb ?4.  Nonbleeding gastric ulcer ?5. Acute decompensated Cirrhosis ?6.  Thrombocytopenia ?7.  Nonpalpable purpura secondary to thrombocytopenia ? ?Chronic: ?Chronic normocytic anemia ?Persistent atrial fibrillation ?Pericardial effusion ?Nonobstructive CAD status post cath ?Type 2 diabetes ?Gout ? ?Discharge Medications: ?Allergies as of 10/17/2021   ? ?   Reactions  ? Ace Inhibitors Other (See Comments)  ? "ARF - see CRF overview"  ? Spironolactone Other (See Comments)  ? Gynecomastia per pt report  ? ?  ? ?  ?Medication List  ?  ? ?STOP taking these medications   ? ?acetaminophen 500 MG tablet ?Commonly known as: TYLENOL ?  ?carvedilol 25 MG tablet ?Commonly known as: Coreg ?  ?FREESTYLE LITE test strip ?Generic drug: glucose blood ?  ?losartan 100 MG tablet ?Commonly known as: Cozaar ?  ? ?  ? ?TAKE these medications   ? ?allopurinol 100 MG tablet ?Commonly known as: ZYLOPRIM ?Take 1 tablet (100 mg total) by mouth daily. ?  ?apixaban 5 MG Tabs tablet ?Commonly known as: ELIQUIS ?Take 1 tablet (5 mg total) by mouth 2 (two) times daily. ?  ?aspirin EC 81 MG tablet ?Take 81 mg by mouth daily. Swallow whole. ?  ?atorvastatin 40 MG tablet ?Commonly known as: LIPITOR ?Take 1 tablet (40 mg total) by mouth daily. ?  ?ferrous sulfate 325 (65 FE) MG tablet ?Take 1 tablet (325 mg total) by mouth daily with breakfast. ?Start taking on: October 18, 2021 ?Notes to patient: Iron can make stool darker; monitor for constipation ?  ?furosemide 40 MG tablet ?Commonly known as: Lasix ?Take 1 tablet (40 mg total) by mouth daily. ?  ?hydrALAZINE 50 MG tablet ?Commonly known as: APRESOLINE ?Take 1 tablet (50 mg total)  by mouth 3 (three) times daily. ?  ?isosorbide mononitrate 60 MG 24 hr tablet ?Commonly known as: IMDUR ?Take 1 tablet (60 mg total) by mouth daily. ?  ?lactulose 10 GM/15ML solution ?Commonly known as: Dylan [name] ?Take 30 mLs (20 g total) by mouth 3 (three) times daily. ?  ?latanoprost 0.005 % ophthalmic solution ?Commonly known as: XALATAN ?Place 1 drop into both eyes at bedtime. ?  ?metFORMIN 850 MG tablet ?Commonly known as: GLUCOPHAGE ?Take 850 mg by mouth 2 (two) times daily. ?  ?pantoprazole 40 MG tablet ?Commonly known as: PROTONIX ?Take 1 tablet (40 mg total) by mouth daily. ?Start taking on: October 18, 2021 ?  ?spironolactone 25 MG tablet ?Commonly known as: Aldactone ?Take 1 tablet (25 mg total) by mouth daily. ?  ? ?  ? ?  ?  ? ? ?  ?Durable Medical Equipment  ?(From admission, onward)  ?  ? ? ?  ? ?  Start     Ordered  ? 10/17/21 1256  DME Walker  Once       ?Question Answer Comment  ?Walker: With 5 Inch Wheels   ?Patient needs a walker to treat with the following condition Orthostatic hypotension   ?  ? 10/17/21 1323  ? ?  ?  ? ?  ? ? ?Disposition and follow-up:   ?DylanMACALLAN [name] was discharged from [facility] in Stable condition.  At the hospital follow up  visit please address: ? ?Symptomatic anemia ?Chronic iron deficiency anemia ?Presenting hemoglobin at 7.2. He was given 1 unit PRBC.  Bleeding thought to be secondary to lower GI bleed from diverticulosis.  Completed EGD and no signs of bleeding.  He will need close follow-up with GI for repeat colonoscopy.  He was given 1 dose of Feraheme 510 mg.  Started on oral supplementation during hospitalization And when to continue this at discharge. ? ?Nonbleeding gastric ulcer ?EGD showed nonbleeding gastric ulcer.  Recommended that patient stay on 40 mg Protonix daily.  Follow-up with GI in 1 to 2 weeks.  Pathology report showed no signs of H. pylori, intestinal metaplasia, atrophy, or dysplasia. ? ?Acute decompensated  Cirrhosis ?Thrombocytopenia ?Started on lactulose 20 mg 3 times daily. ? ?Acute on chronic CKD stage IIIb ?Baseline creatinine around 1.4-1.5.  Initially elevated to 2.04.  Creatinine level fluctuated throughout admission due to cardiorenal syndrome.  Patient was diuresed and creatinine improved. ? ?Heart Failure ?Carvedilol was discontinued as patient had episodes of asystole overnight, but refused CPAP.  Lasix and spironolactone were continued at discharge.  ? ?Labs / imaging needed at time of follow-up: BMP, CBC, PT/INR ? ?Pending labs/ test needing follow-up: none ? ?Follow-up Appointments: ? Follow-up Information   ? ? [facility].   ?Specialty: Home Health Services ?Why: ( [place] ) ?Contact information: ?[address] ?[address] ?[name] [address] ?[phone] ? ? ?  ?  ? ?  ?  ? ?  ? ?Hospital Course by problem list: ?Symptomatic anemia ?Lower GI bleed due to diverticulosis ?Nonbleeding gastric ulcer ?Patient with history of colonic diverticula and atrial fibrillation on Eliquis presented with blood in stool for prior 3 weeks.  He was seen in GI office due to signs of blood in stool for the past 3 months.  This is thought to be due to known diverticula.  At follow up with GI, CBC was a low and he was told to go to the ED.  Hemoglobin is 7.2 on presentation.  Patient endorsed feeling weak, fatigued, and shortness of breath.  He was given 1 unit of blood.  GI evaluated patient and performed EGD.  This showed nonbleeding gastric ulcer and Protonix 40 mg daily recommended.  Samples taken  from gastric antrum.  Pathology showed no signs concerning for H. pylori, intestinal metaplasia, atrophy, or dysplasia.  Patient will need to follow-up with GI for probable repeat colonoscopy.  They did not want to repeat this during admission. ? ?Acute on chronic CKD stage IIIb ?Baseline creatinine 1.4-1.5.  Creatinine elevated to 2 on presentation.  Creatinine fluctuated  during hospitalization.  Initially improved following blood administration.  Then it worsened likely due to cardiorenal syndrome.  Patient was diuresed and creatinine improved.  ? ?Acute decompensated cirrhosis ?Thrombocytopenia ?Palpable purpura secondary to thrombocytopenia ?Ascites SAAG greater than 1.1 ?Hepatic encephalopathy ?INR elevated with low platelet count.  Right upper quadrant ultrasound consistent with cirrhosis.  Class 2/3, MELD score 25.  Patient does have remote history of alcohol, but more likely cirrhosis from nonalcoholic fatty liver disease.  Paracentesis was done with 2.5 L drained.  Not consistent with SBP.  Cytology negative for malignancy.  SAAG score above 1.1.  Protein of 3.6 suggestive of cardiac ascites.  Patient with worsening mentation during hospital course.  Think that this is multifactorial due to hospital delirium and possible component of hepatic encephalopathy.  He did have asterixis present that improved with the addition of  lactulose.  I talked with patient's son and 2 daughters about concern for how patient will do at home caring for himself. ? ?Chronic normocytic anemia ?Patient with history of iron deficiency anemia.  Ganzoni equation with iron deficit of 750 mg.  He was given 1 dose of 510 milligrams Feraheme.  Oral iron supplementation started during hospitalization. ? ?Persistent atrial fibrillation ?Eliquis initially held in the setting of symptomatic anemia.  Patient did not have further episodes of bleeding for 3 days prior to presentation or during admission.  EGD showed nonbleeding gastric ulcer.  His [place] Vasc is elevated at 5.  Benefits greater than risk, Eliquis restarted. ? ?Pericardial effusion ?CT showed small to moderate pericardial effusion.  This has been present on echoes in June 2022 and January 2023.  Patient was hemodynamically stable.  Exam was euvolemic denied shortness of breath. ? ?Nonobstructive CAD status post cath ?Home medications include aspirin  81 mg and atorvastatin 40 mg daily.  Right and left heart cath February 2023 revealed mild LAD lesion 45% stenosed; second diagonal lesion is 85% stenosed.  ASA and atorvastatin resumed at discharge. ? ?Type 2 diabetes ?[place]

## 2021-10-10 NOTE — Interval H&P Note (Signed)
History and Physical Interval Note: ? ?10/10/2021 ?12:19 PM ? ?Dylan [name]  has presented today for surgery, with the diagnosis of GI bleed.  The various methods of treatment have been discussed with the patient and family. After consideration of risks, benefits and other options for treatment, the patient has consented to  Procedure(s): ?ESOPHAGOGASTRODUODENOSCOPY (EGD) WITH PROPOFOL (N/A) as a surgical intervention.  The patient's history has been reviewed, patient examined, no change in status, stable for surgery.  I have reviewed the patient's chart and labs.  Questions were answered to the patient's satisfaction.   ? ? ?[name] ? ? ?

## 2021-10-10 NOTE — Progress Notes (Signed)
Pt is Yellow MEWS at this time. Will implement and follow guidelines below. ? ?MEWS Guidelines - (patients age 78 and over) ? ? ?

## 2021-10-10 NOTE — Op Note (Signed)
[facility] ?Patient Name: Dylan [name] ?Procedure Date : 10/10/2021 ?MRN: [id number] ?Attending MD: Carol Ada , MD ?Date of Birth: Nov 24, 1945 ?CSN: [id number] ?Age: 76 ?Admit Type: Inpatient ?Procedure:                Upper GI endoscopy ?Indications:              Acute post hemorrhagic anemia, Melena ?Providers:                Carol Ada, MD, Allayne Gitelman, RN, Luan Moore,  ?                          Technician, Production assistant, radio, CRNA ?Referring MD:              ?Medicines:                Propofol per Anesthesia ?Complications:            No immediate complications. ?Estimated Blood Loss:     Estimated blood loss: none. ?Procedure:                Pre-Anesthesia Assessment: ?                          - Prior to the procedure, a History and Physical  ?                          was performed, and patient medications and  ?                          allergies were reviewed. The patient's tolerance of  ?                          previous anesthesia was also reviewed. The risks  ?                          and benefits of the procedure and the sedation  ?                          options and risks were discussed with the patient.  ?                          All questions were answered, and informed consent  ?                          was obtained. Prior Anticoagulants: The patient has  ?                          taken no previous anticoagulant or antiplatelet  ?                          agents. ASA Grade Assessment: III - A patient with  ?                          severe systemic disease. After reviewing the risks  ?  and benefits, the patient was deemed in  ?                          satisfactory condition to undergo the procedure. ?                          - Sedation was administered by an anesthesia  ?                          professional. Deep sedation was attained. ?                          After obtaining informed consent, the endoscope was  ?                          passed  under direct vision. Throughout the  ?                          procedure, the patient's blood pressure, pulse, and  ?                          oxygen saturations were monitored continuously. The  ?                          [id number]-[id number] ([id number]) Olympus endoscope was introduced  ?                          through the mouth, and advanced to the second part  ?                          of duodenum. The upper GI endoscopy was  ?                          accomplished without difficulty. The patient  ?                          tolerated the procedure well. ?Scope In: ?Scope Out: ?Findings: ?     The esophagus was normal. ?     One non-bleeding superficial gastric ulcer with a clean ulcer base  ?     (Forrest Class III) was found in the gastric antrum. The lesion was 5 mm  ?     in largest dimension. ?     Patchy moderate inflammation characterized by erythema was found in the  ?     entire examined stomach. Biopsies were taken with a cold forceps for  ?     Helicobacter pylori testing. ?     Patchy moderate inflammation characterized by congestion (edema) and  ?     erythema was found in the duodenal bulb. ?Impression:               - Normal esophagus. ?                          - Non-bleeding gastric ulcer with a clean ulcer  ?  base (Forrest Class III). ?                          - Gastritis. Biopsied. ?                          - Duodenitis. ?Recommendation:           - Return patient to hospital ward for ongoing care. ?                          - Resume regular diet. ?                          - Continue present medications. ?                          - Await pathology results. ?                          - PPI QD. ?                          - Follow up in the office in 2 weeks. ?                          - Signing off. ?Procedure Code(s):        --- Professional --- ?                          [phone], Esophagogastroduodenoscopy, flexible,  ?                          transoral; with biopsy, single or  multiple ?Diagnosis Code(s):        --- Professional --- ?                          K25.9, Gastric ulcer, unspecified as acute or  ?                          chronic, without hemorrhage or perforation ?                          K29.70, Gastritis, unspecified, without bleeding ?                          K29.80, Duodenitis without bleeding ?                          D62, Acute posthemorrhagic anemia ?                          K92.1, Melena (includes Hematochezia) ?CPT copyright 2019 American Medical Association. All rights reserved. ?The codes documented in this report are preliminary and upon coder review may  ?be revised to meet current compliance requirements. ?Carol Ada, MD ?Carol Ada, MD ?10/10/2021 1:01:51 PM ?This report has been signed electronically. ?Number of Addenda: 0 ?

## 2021-10-10 NOTE — Transfer of Care (Signed)
Immediate Anesthesia Transfer of Care Note ? ?Patient: Dylan [name] ? ?Procedure(s) Performed: ESOPHAGOGASTRODUODENOSCOPY (EGD) WITH PROPOFOL ?BIOPSY ? ?Patient Location: Endoscopy Unit ? ?Anesthesia Type:MAC ? ?Level of Consciousness: drowsy, patient cooperative and responds to stimulation ? ?Airway & Oxygen Therapy: Patient Spontanous Breathing and Patient connected to face mask oxygen ? ?Post-op Assessment: Report given to RN ? ?Post vital signs: Reviewed and stable ? ?Last Vitals:  ?Vitals Value Taken Time  ?BP 120/68 10/10/21 1300  ?Temp 36.7 ?C 10/10/21 1300  ?Pulse 74 10/10/21 1303  ?Resp 22 10/10/21 1303  ?SpO2 90 % 10/10/21 1303  ?Vitals shown include unvalidated device data. ? ?Last Pain:  ?Vitals:  ? 10/10/21 1300  ?TempSrc: Temporal  ?PainSc: Asleep  ?   ? ?  ? ?Complications: No notable events documented. ?

## 2021-10-10 NOTE — Progress Notes (Signed)
? ?HD#0 ?SUBJECTIVE:  ?Patient Summary: Dylan [name] is a 76 year old male with a history of colonic diverticula, CKD stage III, HTN, chronic anemia, chronic hypoxic respiratory failure on 3 L, gout, non-insulin dependent T2DM, HFrEF (EF 30-35% 1/23) and A-fib on Eliquis who presented with increasing bloody stool and anemia and admitted for symptomatic anemia.  ? ?Overnight Events: None ? ?Interim History: Patient assessed at bedside this AM.  He states that he feels better than yesterday.  He has not had further bleeding with bowel movements.  He is ready to be taken for EGD.  His caregiver is at bedside.  No other concerns at this time. ? ?OBJECTIVE:  ?Vital Signs: ?Vitals:  ? 10/10/21 1302 10/10/21 1322 10/10/21 1344 10/10/21 1625  ?BP: 109/66 107/70 (!) 149/85 (!) 143/87  ?Pulse: 73 84 67 81  ?Resp: 15 (!) 25 (!) 28 (!) 23  ?Temp:   98.3 ?F (36.8 ?C) 98.8 ?F (37.1 ?C)  ?TempSrc:      ?SpO2: 92% 93% 92% 96%  ?Weight:      ?Height:      ? ?Supplemental O2: Nasal Cannula ?SpO2: 96 % ?O2 Flow Rate (L/min): 3 L/min ? ?Filed Weights  ? 10/09/21 1403 10/10/21 1158  ?Weight: 78 kg 70.3 kg  ? ? ? ?Intake/Output Summary (Last 24 hours) at 10/10/2021 1651 ?Last data filed at 10/10/2021 1303 ?Gross per 24 hour  ?Intake 1575 ml  ?Output 500 ml  ?Net 1075 ml  ? ?Net IO Since Admission: 1,425 mL [10/10/21 1651] ? ?Physical Exam: ?Physical Exam ?Constitutional:   ?   General: He is not in acute distress. ?HENT:  ?   Head: Normocephalic.  ?   Mouth/Throat:  ?   Mouth: Mucous membranes are moist.  ?Cardiovascular:  ?   Rate and Rhythm: Normal rate and regular rhythm.  ?   Pulses: Normal pulses.  ?   Heart sounds: Normal heart sounds.  ?Pulmonary:  ?   Effort: Pulmonary effort is normal.  ?   Breath sounds: Normal breath sounds.  ?   Comments: Stable oxygen saturations on 3 L nasal cannula ?Abdominal:  ?   General: Abdomen is flat. Bowel sounds are normal. There is no distension.  ?   Palpations: Abdomen is soft.  ?   Tenderness:  There is no abdominal tenderness.  ?Musculoskeletal:     ?   General: No swelling.  ?Skin: ?   General: Skin is warm and dry.  ?Neurological:  ?   Mental Status: He is alert.  ?  ? ?Patient Lines/Drains/Airways Status   ? ? Active Line/Drains/Airways   ? ? Name Placement date Placement time Site Days  ? Peripheral IV 10/09/21 18 G Anterior;Distal;Right;Upper Antecubital 10/09/21  1549  Antecubital  1  ? Peripheral IV 10/09/21 20 G Anterior;Distal;Left;Upper Antecubital 10/09/21  1637  Antecubital  1  ? Sheath 08/09/21 Right Femoral;Venous 08/09/21  0952  Femoral;Venous  62  ? Airway 10/21/19  --  -- 720  ? Incision (Closed) 10/21/19 Finger (Comment which one) Left 10/21/19  2112  -- 720  ? ?  ?  ? ?  ? ? ?Pertinent Labs: ? ?  Latest Ref Rng & Units 10/10/2021  ?  5:19 AM 10/09/2021  ?  2:10 PM 08/10/2021  ?  2:09 AM  ?CBC  ?WBC 4.0 - 10.5 K/uL 8.3   8.0   9.0    ?Hemoglobin 13.0 - 17.0 g/dL 8.7   7.2   11.3    ?Hematocrit  39.0 - 52.0 % 29.6   24.9   34.8    ?Platelets 150 - 400 K/uL 92   99   141    ? ? ? ?  Latest Ref Rng & Units 10/10/2021  ?  5:19 AM 10/09/2021  ?  2:10 PM 09/17/2021  ?  8:51 AM  ?CMP  ?Glucose 70 - 99 mg/dL 163   265   187    ?BUN 8 - 23 mg/dL 42   49   38    ?Creatinine 0.61 - 1.24 mg/dL 1.69   2.04   1.85    ?Sodium 135 - 145 mmol/L 140   142   144    ?Potassium 3.5 - 5.1 mmol/L 3.4   4.0   4.3    ?Chloride 98 - 111 mmol/L 114   112   107    ?CO2 22 - 32 mmol/L _0 ?Calcium 8.9 - 10.3 mg/dL 8.4   9.0   9.4    ?Total Protein 6.5 - 8.1 g/dL  6.6     ?Total Bilirubin 0.3 - 1.2 mg/dL  0.9     ?Alkaline Phos 38 - 126 U/L  86     ?AST 15 - 41 U/L  18     ?ALT 0 - 44 U/L  22     ? ? ?Recent Labs  ?  10/10/21 ?0751 10/10/21 ?1204 10/10/21 ?1356  ?GLUCAP 159* 154* 147*  ?  ? ?Pertinent Imaging: ?DG Chest Portable 1 View ? ?Result Date: 10/09/2021 ?CLINICAL DATA:  76 year old male presents for evaluation of fluid overload. EXAM: PORTABLE CHEST 1 VIEW COMPARISON:  August 03, 2021. FINDINGS: EKG leads  project over the chest. Trachea midline. Cardiomediastinal contours and hilar structures normal. Increasing opacity in the LEFT lung base as compared to previous imaging. No pneumothorax. On limited assessment there is no acute skeletal finding. IMPRESSION: Increasing opacity in the LEFT lung base as compared to previous imaging. Findings may reflect consolidation or atelectasis with associated effusion obscuring the LEFT hemidiaphragmatic contour. Electronically Signed   By: Zetta Bills M.D.   On: 10/09/2021 17:08   ? ?ASSESSMENT/PLAN:  ?Assessment: ?Principal Problem: ?  GI bleed ?Active Problems: ?  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  Gout ?  Coronary artery disease without angina pectoris ? ? ?Dylan [name] is a 76 year old male with a history of colonic diverticula, CKD stage III, HTN, chronic anemia, chronic hypoxic respiratory failure on 3 L, gout, non-insulin dependent T2DM, HFrEF (EF 30-35% 1/23) and A-fib on Eliquis who presented with increasing bloody stool and anemia and admitted for symptomatic anemia.  ? ?Plan: ?#Symptomatic anemia likely from GI bleed ?#Lower GI bleed due to diverticulosis ?Hemoglobin stable after 1 unit of blood.  Endoscopy completed and showed nonbleeding superficial gastric ulcer with clean ulcer base.  Biopsies were taken.  Bleeding thought to be due to lower GI bleed and colonoscopy will need to be completed as outpatient.  Patient will follow-up with GI in 2 weeks.  Patient reports that he has not bled since Saturday.  He has a significant [place] Vasc and needs to be restarted on Eliquis if no signs of active bleeding.  ? ?-restart Eliquis ?-Daily CBC ?-Follow-up pathology ?-Transition IV Protonix to p.o. qd ?-Follow-up with GI in 2 weeks ? ?#Chronic normocytic anemia ?#Thrombocytopenia ?Baseline hemoglobin around 11.  Chronic anemia thought to be from diverticuli and CKD stage III.  Iron studies in January reflective of iron  deficiency anemia. Deficit of 753 g per Ganzoni equation.  ? ?-Will give 1 dose of feraheme for 510 mg Iron and plan to start PO supplementation at discharge ?-Follow-up on smear ?-Daily CBC ?-PT/INR ?-Transfuse if hemoglobin is less than 7 ? ?#HFrEF (EF 30 to 35% 1/23) ?#Hypertrophic cardiomyopathy ?#Hypertension ?BNP elevated at 2500.  His weight is up about 5 pounds from dry weight.  CT showed small bilateral pleural effusions.  On exam patient appears euvolemic, he is on his home supplemental oxygen and no lower extremity edema present.  Will restart goal-directed medical therapy. ? ?-Restart Lasix 40 mg and spironolactone 25 mg ?-Hold losartan 100 mg ? ?#Acute on chronic CKD stage IIIb ?Creatinine increased thought to be secondary to volume depletion with lower GI bleed.  Creatinine improved to 1.69 this morning.  His baseline is around 1.3-1.5.   ? ?-Daily BMP ? ?#Persistent atrial fibrillation ?CHA2DS2-2VASC elevated at 5.  Patient has not had additional episodes of bloody bowel movement since Saturday.  Hemoglobin is stable following 1 unit transfusion overnight.  Will restart Eliquis. ? ?-Restart Eliquis ? ?#Pericardial effusion ?CT abdomen showed small to moderate pericardial effusion without evidence of tamponade.  This is consistent with prior echoes since June 2022.  On exam patient is hemodynamically stable.  He appears euvolemic. ? ?#Nonobstructive CAD status post cath ?Home medication include aspirin 81 mg daily and atorvastatin 40 mg daily.  Right and left heart cath February 2023 revealed mid LAD lesion 45% stenosed; second diag lesion is 85% stenosis.   ?-- resume ASA ?--restart atorvastatin ? ?#Type 2 diabetes mellitus ?Home medications include metformin 500 mg twice daily.  A1c assessed January 2023, 7.5%. ?--Holding metformin ?--SSI ? ?#Gout ?Resumed allopurinol 100 mg ? ?Best Practice: ?Diet: Regular diet ?IVF: Fluids: none ?VTE: SCDs Start: 10/09/21 2042 ?Code: Full ?AB: none ?Therapy Recs:  none ?Family Contact: updated at bedside ?DISPO: Anticipated discharge tomorrow to Home pending Medical stability. ? ?Signature: ?Daleen Bo. Ember Gottwald, D.O.  ?Internal Medicine Resident, PGY-1 ?[facility]

## 2021-10-11 ENCOUNTER — Encounter (HOSPITAL_COMMUNITY): Payer: Self-pay | Admitting: Gastroenterology

## 2021-10-11 LAB — SAVE SMEAR(SSMR), FOR PROVIDER SLIDE REVIEW

## 2021-10-11 LAB — GLUCOSE, CAPILLARY
Glucose-Capillary: 197 mg/dL — ABNORMAL HIGH (ref 70–99)
Glucose-Capillary: 236 mg/dL — ABNORMAL HIGH (ref 70–99)
Glucose-Capillary: 259 mg/dL — ABNORMAL HIGH (ref 70–99)
Glucose-Capillary: 141 mg/dL — ABNORMAL HIGH (ref 70–99)

## 2021-10-11 LAB — TECHNOLOGIST SMEAR REVIEW: Plt Morphology: DECREASED

## 2021-10-11 LAB — CBC
HCT: 33.3 % — ABNORMAL LOW (ref 39.0–52.0)
Hemoglobin: 9.3 g/dL — ABNORMAL LOW (ref 13.0–17.0)
MCH: 26.3 pg (ref 26.0–34.0)
MCHC: 27.9 g/dL — ABNORMAL LOW (ref 30.0–36.0)
MCV: 94.1 fL (ref 80.0–100.0)
Platelets: 88 10*3/uL — ABNORMAL LOW (ref 150–400)
RBC: 3.54 MIL/uL — ABNORMAL LOW (ref 4.22–5.81)
RDW: 17.4 % — ABNORMAL HIGH (ref 11.5–15.5)
WBC: 13.8 10*3/uL — ABNORMAL HIGH (ref 4.0–10.5)
nRBC: 0.3 % — ABNORMAL HIGH (ref 0.0–0.2)

## 2021-10-11 LAB — BASIC METABOLIC PANEL
Anion gap: 10 (ref 5–15)
Anion gap: 8 (ref 5–15)
BUN: 45 mg/dL — ABNORMAL HIGH (ref 8–23)
BUN: 45 mg/dL — ABNORMAL HIGH (ref 8–23)
CO2: 17 mmol/L — ABNORMAL LOW (ref 22–32)
CO2: 19 mmol/L — ABNORMAL LOW (ref 22–32)
Calcium: 9.1 mg/dL (ref 8.9–10.3)
Calcium: 9.1 mg/dL (ref 8.9–10.3)
Chloride: 112 mmol/L — ABNORMAL HIGH (ref 98–111)
Chloride: 111 mmol/L (ref 98–111)
Creatinine, Ser: 2 mg/dL — ABNORMAL HIGH (ref 0.61–1.24)
Creatinine, Ser: 2.16 mg/dL — ABNORMAL HIGH (ref 0.61–1.24)
GFR, Estimated: 34 mL/min — ABNORMAL LOW (ref >60)
GFR, Estimated: 31 mL/min — ABNORMAL LOW (ref >60)
Glucose, Bld: 241 mg/dL — ABNORMAL HIGH (ref 70–99)
Glucose, Bld: 276 mg/dL — ABNORMAL HIGH (ref 70–99)
Potassium: 4.2 mmol/L (ref 3.5–5.1)
Potassium: 4 mmol/L (ref 3.5–5.1)
Sodium: 139 mmol/L (ref 135–145)
Sodium: 138 mmol/L (ref 135–145)

## 2021-10-11 LAB — MAGNESIUM: Magnesium: 2.6 mg/dL — ABNORMAL HIGH (ref 1.7–2.4)

## 2021-10-11 LAB — PROTIME-INR
INR: 1.8 — ABNORMAL HIGH (ref 0.8–1.2)
Prothrombin Time: 20.3 seconds — ABNORMAL HIGH (ref 11.4–15.2)

## 2021-10-11 LAB — SURGICAL PATHOLOGY

## 2021-10-11 LAB — LACTATE DEHYDROGENASE: LDH: 179 U/L (ref 98–192)

## 2021-10-11 MED ORDER — INSULIN ASPART 100 UNIT/ML IJ SOLN
2.0000 [IU] | Freq: Three times a day (TID) | INTRAMUSCULAR | Status: DC
  Administered 2021-10-11 – 2021-10-14 (×11): 2 [IU] via SUBCUTANEOUS

## 2021-10-11 MED ORDER — FUROSEMIDE 40 MG PO TABS
40.0000 mg | ORAL_TABLET | Freq: Every day | ORAL | Status: DC
  Administered 2021-10-11: 40 mg via ORAL
  Filled 2021-10-11: qty 1

## 2021-10-11 NOTE — Anesthesia Postprocedure Evaluation (Signed)
Anesthesia Post Note ? ?Patient: Dylan [name] ? ?Procedure(s) Performed: ESOPHAGOGASTRODUODENOSCOPY (EGD) WITH PROPOFOL ?BIOPSY ? ?  ? ?Patient location during evaluation: PACU ?Anesthesia Type: MAC ?Level of consciousness: awake and alert ?Pain management: pain level controlled ?Vital Signs Assessment: post-procedure vital signs reviewed and stable ?Respiratory status: spontaneous breathing, nonlabored ventilation, respiratory function stable and patient connected to nasal cannula oxygen ?Cardiovascular status: stable and blood pressure returned to baseline ?Postop Assessment: no apparent nausea or vomiting ?Anesthetic complications: no ? ? ?No notable events documented. ? ?Last Vitals:  ?Vitals:  ? 10/10/21 2235 10/11/21 0155  ?BP: (!) 144/81 124/75  ?Pulse: 75 66  ?Resp: (!) 22 (!) 24  ?Temp: 36.4 ?C 37.3 ?C  ?SpO2: 93% 98%  ?  ?Last Pain:  ?Vitals:  ? 10/11/21 0155  ?TempSrc: Oral  ?PainSc:   ? ?Pain Goal:   ? ?  ?  ?  ?  ?  ?  ?  ? ?Riggin Cuttino L Johncarlos Holtsclaw ? ? ? ? ?

## 2021-10-11 NOTE — Care Management Important Message (Signed)
Important Message ? ?Patient Details  ?Name: Dylan [name] ?MRN: [id number] ?Date of Birth: March 16, 1946 ? ? ?Medicare Important Message Given:  Yes ? ? ? ? ?Levada Dy  Murad Staples-Martin ?10/11/2021, 1:24 PM ?

## 2021-10-11 NOTE — Progress Notes (Signed)
Mobility Specialist Progress Note: ? ? 10/11/21 1509  ?Mobility  ?Activity Transferred from bed to chair  ?Level of Assistance Standby assist, set-up cues, supervision of patient - no hands on  ?Assistive Device Front wheel walker  ?Distance Ambulated (ft) 4 ft  ?Activity Response Tolerated well  ?$Mobility charge 1 Mobility  ? ?Pt declining further mobilization. Left in chair with call bell in reach and all needs met.  ? ?Dylan [name] ?Mobility Specialist ?Primary Phone [phone] ? ?

## 2021-10-11 NOTE — Progress Notes (Signed)
? ?HD#1 ?SUBJECTIVE:  ?Patient Summary: Dylan [name] is a 76 year old male with a history of colonic diverticula, CKD stage III, HTN, chronic anemia, chronic hypoxic respiratory failure on 3 L, gout, non-insulin dependent T2DM, HFrEF (EF 30-35% 1/23) and A-fib on Eliquis who presented with increasing bloody stool and anemia and admitted for symptomatic anemia.  ? ?Overnight Events: None ? ?Interim History: Patient assessed at bedside this AM.  He states that he feels better than yesterday.  He is eating breakfast.  He states that he would like to go home.  No other concerns at this time. ? ?OBJECTIVE:  ?Vital Signs: ?Vitals:  ? 10/11/21 0822 10/11/21 0900 10/11/21 1400 10/11/21 1540  ?BP: 133/83 (!) 142/94 103/63 95/62  ?Pulse: 78 67 73 75  ?Resp: _0 ?Temp: 98.2 ?F (36.8 ?C) 98.2 ?F (36.8 ?C) 98 ?F (36.7 ?C) 98 ?F (36.7 ?C)  ?TempSrc:  Oral Oral   ?SpO2: 92% (!) 88% 93% 90%  ?Weight:      ?Height:      ? ?Supplemental O2: Nasal Cannula ?SpO2: 90 % ?O2 Flow Rate (L/min): 3 L/min ? ?Filed Weights  ? 10/09/21 1403 10/10/21 1158  ?Weight: 78 kg 70.3 kg  ? ? ? ?Intake/Output Summary (Last 24 hours) at 10/11/2021 1902 ?Last data filed at 10/11/2021 1844 ?Gross per 24 hour  ?Intake 840 ml  ?Output 1300 ml  ?Net -460 ml  ? ? ?Net IO Since Admission: 965 mL [10/11/21 1902] ? ?Physical Exam: ?Physical Exam ?Constitutional:   ?   General: He is not in acute distress. ?HENT:  ?   Head: Normocephalic.  ?   Mouth/Throat:  ?   Mouth: Mucous membranes are moist.  ?Cardiovascular:  ?   Rate and Rhythm: Normal rate and regular rhythm.  ?   Pulses: Normal pulses.  ?   Heart sounds: Normal heart sounds.  ?   Comments: JVD present to Jaw ?Pulmonary:  ?   Effort: Pulmonary effort is normal.  ?   Breath sounds: Normal breath sounds.  ?   Comments: Stable oxygen saturations on 3 L nasal cannula ?Abdominal:  ?   General: Abdomen is flat. Bowel sounds are normal. There is no distension.  ?   Palpations: Abdomen is soft.  ?    Tenderness: There is no abdominal tenderness.  ?Musculoskeletal:     ?   General: No swelling.  ?Skin: ?   General: Skin is warm and dry.  ?Neurological:  ?   Mental Status: He is alert.  ?  ? ?Patient Lines/Drains/Airways Status   ? ? Active Line/Drains/Airways   ? ? Name Placement date Placement time Site Days  ? Peripheral IV 10/09/21 18 G Anterior;Distal;Right;Upper Antecubital 10/09/21  1549  Antecubital  1  ? Peripheral IV 10/09/21 20 G Anterior;Distal;Left;Upper Antecubital 10/09/21  1637  Antecubital  1  ? Sheath 08/09/21 Right Femoral;Venous 08/09/21  0952  Femoral;Venous  62  ? Airway 10/21/19  --  -- 720  ? Incision (Closed) 10/21/19 Finger (Comment which one) Left 10/21/19  2112  -- 720  ? ?  ?  ? ?  ? ? ?Pertinent Labs: ? ?  Latest Ref Rng & Units 10/11/2021  ? 12:34 AM 10/10/2021  ?  5:19 AM 10/09/2021  ?  2:10 PM  ?CBC  ?WBC 4.0 - 10.5 K/uL 13.8   8.3   8.0    ?Hemoglobin 13.0 - 17.0 g/dL 9.3   8.7   7.2    ?  Hematocrit 39.0 - 52.0 % 33.3   29.6   24.9    ?Platelets 150 - 400 K/uL 88   92   99    ? ? ? ?  Latest Ref Rng & Units 10/11/2021  ? 11:55 AM 10/11/2021  ? 12:34 AM 10/10/2021  ?  5:19 AM  ?CMP  ?Glucose 70 - 99 mg/dL 276   241   163    ?BUN 8 - 23 mg/dL 45   45   42    ?Creatinine 0.61 - 1.24 mg/dL 2.16   2.00   1.69    ?Sodium 135 - 145 mmol/L 138   139   140    ?Potassium 3.5 - 5.1 mmol/L 4.0   4.2   3.4    ?Chloride 98 - 111 mmol/L 111   112   114    ?CO2 22 - 32 mmol/L _0 ?Calcium 8.9 - 10.3 mg/dL 9.1   9.1   8.4    ? ? ?Recent Labs  ?  10/11/21 ?0822 10/11/21 ?1134 10/11/21 ?1542  ?GLUCAP 197* 236* 259*  ? ?  ? ?Pertinent Imaging: ?No results found. ? ?ASSESSMENT/PLAN:  ?Assessment: ?Principal Problem: ?  GI bleed ?Active Problems: ?  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  Gout ?  Coronary artery disease without angina pectoris ? ? ?Dylan [name] is a 76 year old male with a history of colonic diverticula, CKD stage III, HTN, chronic anemia,  chronic hypoxic respiratory failure on 3 L, gout, non-insulin dependent T2DM, HFrEF (EF 30-35% 1/23) and A-fib on Eliquis who presented with increasing bloody stool and anemia and admitted for symptomatic anemia.  ? ?Plan: ?#Symptomatic anemia likely from GI bleed ?#Lower GI bleed due to diverticulosis ?Hemoglobin stable from 8.7-9.3.   Eliquis was restarted.  No further signs of bleeding. ? ?-Continue Eliquis ?-Daily CBC ?-Follow-up pathology ?-Continue Protonix p.o. daily ?-Follow-up with GI in 2 weeks ? ?#Chronic normocytic anemia ?#Thrombocytopenia ?Baseline hemoglobin around 11.  Start patient on oral iron.  HaptoGlobin pending.  LDH within normal limits.  Smear was unremarkable ? ?-Daily CBC ?-PT/INR ?-Transfuse if hemoglobin is less than 7 ? ?#HFrEF (EF 30 to 35% 1/23) ?#Hypertrophic cardiomyopathy ?#Hypertension ?Lasix was restarted yesterday.  Creatinine trended up.  It was held today.  Repeat BMP showed worsening of creatinine.  We will restart Lasix. Patient states that he does not take spironolactone at home.  He would prefer not to start this medication at this time and discuss with his PCP at discharge. ? ?-Restart Lasix 40 mg ?-Discontinue spironolactone 25 mg ?-Hold losartan 100 mg ? ?#Acute on chronic CKD stage IIIb ?His baseline is around 1.3-1.5.  Lasix was held today due to creatinine increasing after restarting yesterday.  On exam patient's lungs are clear to auscultation bilaterally, no lower extremity edema.  JVD is notable up to jaw.  Repeat BMP this afternoon showed worsening of creatinine.  Will restart Lasix. ? ?-Daily BMP ?-Restart Lasix ? ?#Persistent atrial fibrillation ?Eliquis was restarted yesterday no additional bloody bowel movements. ? ?-Continue Eliquis ? ? ?Best Practice: ?Diet: Regular diet ?IVF: Fluids: none ?VTE: SCDs Start: 10/09/21 2042 ?Code: Full ?AB: none ?Therapy Recs: none ?Family Contact: updated at bedside ?DISPO: Anticipated discharge tomorrow to Home pending  Medical stability. ? ?Signature: ?Daleen Bo. Aleksander Edmiston, D.O.  ?Internal Medicine Resident, PGY-1 ?[facility]  ?Pager: [phone] ?7:02 PM, 10/11/2021  ? ?Please contact the  on call pager after 5 pm and on weekends at [phone].  ?

## 2021-10-12 ENCOUNTER — Inpatient Hospital Stay (HOSPITAL_COMMUNITY): Payer: Medicare HMO

## 2021-10-12 LAB — CBC
HCT: 29.9 % — ABNORMAL LOW (ref 39.0–52.0)
Hemoglobin: 9 g/dL — ABNORMAL LOW (ref 13.0–17.0)
MCH: 26.7 pg (ref 26.0–34.0)
MCHC: 30.1 g/dL (ref 30.0–36.0)
MCV: 88.7 fL (ref 80.0–100.0)
Platelets: 96 10*3/uL — ABNORMAL LOW (ref 150–400)
RBC: 3.37 MIL/uL — ABNORMAL LOW (ref 4.22–5.81)
RDW: 17.5 % — ABNORMAL HIGH (ref 11.5–15.5)
WBC: 11 10*3/uL — ABNORMAL HIGH (ref 4.0–10.5)
nRBC: 1.5 % — ABNORMAL HIGH (ref 0.0–0.2)

## 2021-10-12 LAB — BASIC METABOLIC PANEL
Anion gap: 9 (ref 5–15)
Anion gap: 8 (ref 5–15)
BUN: 52 mg/dL — ABNORMAL HIGH (ref 8–23)
BUN: 52 mg/dL — ABNORMAL HIGH (ref 8–23)
CO2: 16 mmol/L — ABNORMAL LOW (ref 22–32)
CO2: 21 mmol/L — ABNORMAL LOW (ref 22–32)
Calcium: 8.9 mg/dL (ref 8.9–10.3)
Calcium: 9.2 mg/dL (ref 8.9–10.3)
Chloride: 114 mmol/L — ABNORMAL HIGH (ref 98–111)
Chloride: 112 mmol/L — ABNORMAL HIGH (ref 98–111)
Creatinine, Ser: 2.57 mg/dL — ABNORMAL HIGH (ref 0.61–1.24)
Creatinine, Ser: 2.55 mg/dL — ABNORMAL HIGH (ref 0.61–1.24)
GFR, Estimated: 25 mL/min — ABNORMAL LOW (ref >60)
GFR, Estimated: 25 mL/min — ABNORMAL LOW (ref >60)
Glucose, Bld: 181 mg/dL — ABNORMAL HIGH (ref 70–99)
Glucose, Bld: 204 mg/dL — ABNORMAL HIGH (ref 70–99)
Potassium: 4.7 mmol/L (ref 3.5–5.1)
Potassium: 3.8 mmol/L (ref 3.5–5.1)
Sodium: 139 mmol/L (ref 135–145)
Sodium: 141 mmol/L (ref 135–145)

## 2021-10-12 LAB — GLUCOSE, CAPILLARY
Glucose-Capillary: 161 mg/dL — ABNORMAL HIGH (ref 70–99)
Glucose-Capillary: 210 mg/dL — ABNORMAL HIGH (ref 70–99)
Glucose-Capillary: 170 mg/dL — ABNORMAL HIGH (ref 70–99)
Glucose-Capillary: 173 mg/dL — ABNORMAL HIGH (ref 70–99)

## 2021-10-12 LAB — HEPATIC FUNCTION PANEL
ALT: 18 U/L (ref 0–44)
AST: 18 U/L (ref 15–41)
Albumin: 3.4 g/dL — ABNORMAL LOW (ref 3.5–5.0)
Alkaline Phosphatase: 77 U/L (ref 38–126)
Bilirubin, Direct: 0.2 mg/dL (ref 0.0–0.2)
Indirect Bilirubin: 0.8 mg/dL (ref 0.3–0.9)
Total Bilirubin: 1 mg/dL (ref 0.3–1.2)
Total Protein: 6.6 g/dL (ref 6.5–8.1)

## 2021-10-12 LAB — PATHOLOGIST SMEAR REVIEW

## 2021-10-12 LAB — URINALYSIS, ROUTINE W REFLEX MICROSCOPIC
Bilirubin Urine: NEGATIVE
Glucose, UA: NEGATIVE mg/dL
Hgb urine dipstick: NEGATIVE
Ketones, ur: NEGATIVE mg/dL
Leukocytes,Ua: NEGATIVE
Nitrite: NEGATIVE
Protein, ur: NEGATIVE mg/dL
Specific Gravity, Urine: 1.01 (ref 1.005–1.030)
pH: 5 (ref 5.0–8.0)

## 2021-10-12 LAB — MAGNESIUM: Magnesium: 2.5 mg/dL — ABNORMAL HIGH (ref 1.7–2.4)

## 2021-10-12 LAB — PROTIME-INR
INR: 2.3 — ABNORMAL HIGH (ref 0.8–1.2)
Prothrombin Time: 25.3 seconds — ABNORMAL HIGH (ref 11.4–15.2)

## 2021-10-12 MED ORDER — FERROUS SULFATE 325 (65 FE) MG PO TABS
325.0000 mg | ORAL_TABLET | Freq: Every day | ORAL | Status: DC
  Administered 2021-10-12 – 2021-10-17 (×6): 325 mg via ORAL
  Filled 2021-10-12 (×6): qty 1

## 2021-10-12 MED ORDER — SODIUM CHLORIDE 0.9 % IV SOLN: 2.0000 g | INTRAVENOUS | Status: DC

## 2021-10-12 MED ORDER — SODIUM CHLORIDE 0.9 % IV SOLN
1.0000 g | INTRAVENOUS | Status: DC
  Administered 2021-10-12 – 2021-10-13 (×2): 1 g via INTRAVENOUS
  Filled 2021-10-12 (×2): qty 10

## 2021-10-12 MED ORDER — FUROSEMIDE 10 MG/ML IJ SOLN
40.0000 mg | Freq: Once | INTRAMUSCULAR | Status: AC
  Administered 2021-10-12: 40 mg via INTRAVENOUS
  Filled 2021-10-12: qty 4

## 2021-10-12 MED ORDER — FUROSEMIDE 10 MG/ML IJ SOLN
40.0000 mg | Freq: Once | INTRAMUSCULAR | Status: AC
  Administered 2021-10-13: 40 mg via INTRAVENOUS
  Filled 2021-10-12: qty 4

## 2021-10-12 NOTE — Plan of Care (Signed)
Pt alert and oriented x 4. Pt up 1 assist. No prns given. Snack provided at hs per request of friend Cathlean Cower. On call MD contacted due to bp 104/64. Hydralazine and coreg held. Pt had 800 ml out after lasix this shift. BP this am 157/92. Pt med compliant. Last bm 4/6.  ?Problem: Education: ?Goal: Knowledge of General Education information will improve ?Description: Including pain rating scale, medication(s)/side effects and non-pharmacologic comfort measures ?Outcome: Progressing ?  ?Problem: Health Behavior/Discharge Planning: ?Goal: Ability to manage health-related needs will improve ?Outcome: Progressing ?  ?Problem: Clinical Measurements: ?Goal: Ability to maintain clinical measurements within normal limits will improve ?Outcome: Progressing ?Goal: Will remain free from infection ?Outcome: Progressing ?Goal: Diagnostic test results will improve ?Outcome: Progressing ?Goal: Respiratory complications will improve ?Outcome: Progressing ?Goal: Cardiovascular complication will be avoided ?Outcome: Progressing ?  ?Problem: Activity: ?Goal: Risk for activity intolerance will decrease ?Outcome: Progressing ?  ?Problem: Nutrition: ?Goal: Adequate nutrition will be maintained ?Outcome: Progressing ?  ?Problem: Coping: ?Goal: Level of anxiety will decrease ?Outcome: Progressing ?  ?Problem: Elimination: ?Goal: Will not experience complications related to bowel motility ?Outcome: Progressing ?Goal: Will not experience complications related to urinary retention ?Outcome: Progressing ?  ?Problem: Pain Managment: ?Goal: General experience of comfort will improve ?Outcome: Progressing ?  ?Problem: Safety: ?Goal: Ability to remain free from injury will improve ?Outcome: Progressing ?  ?Problem: Skin Integrity: ?Goal: Risk for impaired skin integrity will decrease ?Outcome: Progressing ?  ?

## 2021-10-12 NOTE — Progress Notes (Signed)
?  10/11/21 2215  ?Notify: Provider  ?Provider Name/Title Dr. Allena Katz  ?Date Provider Notified 10/11/21  ?Time Provider Notified 2210  ?Notification Type Page  ?Notification Reason Other (Comment) ?(BP 104/64 no parameters on BP meds)  ?Provider response Other (Comment) ?(Held Hydralazine and coreg)  ?Date of Provider Response 10/11/21  ?Time of Provider Response 2215  ? ? ?

## 2021-10-12 NOTE — Progress Notes (Addendum)
? ?HD#2 ?SUBJECTIVE:  ?Patient Summary: Dylan [name] is a 76 year old male with a history of colonic diverticula, CKD stage III, HTN, chronic anemia, chronic hypoxic respiratory failure on 3 L, gout, non-insulin dependent T2DM, HFrEF (EF 30-35% 1/23) and A-fib on Eliquis who presented with increasing bloody stool and anemia and admitted for symptomatic anemia.  ? ?Overnight Events: Blood pressure medications held due to blood pressure of 104/64.  Patient was asymptomatic at that time. ? ?Interim History: Patient assessed at bedside this AM.  He states that he feels well this morning.  He has noted some shortness of breath that feels a little worse than yesterday.  He has difficulty laying flat at baseline, but does not use pillows to sleep with.  Increased abdominal swelling. No additional concerns at this time. ? ?OBJECTIVE:  ?Vital Signs: ?Vitals:  ? 10/11/21 2200 10/12/21 0518 10/12/21 4492 10/12/21 0810  ?BP: 104/64 (!) 157/92  (!) 178/80  ?Pulse: 73 67  70  ?Resp: _0 ?Temp: 97.8 ?F (36.6 ?C) 98.4 ?F (36.9 ?C)  98.2 ?F (36.8 ?C)  ?TempSrc:  Oral    ?SpO2: 96% 94%  97%  ?Weight:   84.1 kg   ?Height:      ? ?Supplemental O2: Nasal Cannula ?SpO2: 97 % ?O2 Flow Rate (L/min): 3 L/min ? ?Filed Weights  ? 10/09/21 1403 10/10/21 1158 10/12/21 0627  ?Weight: 78 kg 70.3 kg 84.1 kg  ? ? ? ?Intake/Output Summary (Last 24 hours) at 10/12/2021 1214 ?Last data filed at 10/12/2021 0100 ?Gross per 24 hour  ?Intake 460 ml  ?Output 1400 ml  ?Net -940 ml  ? ?Net IO Since Admission: 385 mL [10/12/21 1214] ? ?Physical Exam: ?Physical Exam ?HENT:  ?   Mouth/Throat:  ?   Mouth: Mucous membranes are moist.  ?Cardiovascular:  ?   Rate and Rhythm: Normal rate. Rhythm irregular.  ?   Heart sounds: Normal heart sounds.  ?Pulmonary:  ?   Effort: Pulmonary effort is normal.  ?   Breath sounds: Normal breath sounds.  ?Abdominal:  ?   General: Bowel sounds are normal. There is distension.  ?   Tenderness: There is no abdominal tenderness.   ?Musculoskeletal:     ?   General: No swelling.  ?Skin: ?   General: Skin is warm and dry.  ?   Comments: Red macules present on right side of abdomen, flat   ?Neurological:  ?   Mental Status: He is alert and oriented to person, place, and time.  ?  ?  ? ? ? ? ? ?Patient Lines/Drains/Airways Status   ? ? Active Line/Drains/Airways   ? ? Name Placement date Placement time Site Days  ? Peripheral IV 10/09/21 18 G Anterior;Distal;Right;Upper Antecubital 10/09/21  1549  Antecubital  3  ? Peripheral IV 10/09/21 20 G Anterior;Distal;Left;Upper Antecubital 10/09/21  1637  Antecubital  3  ? Sheath 08/09/21 Right Femoral;Venous 08/09/21  0952  Femoral;Venous  64  ? External Urinary Catheter 10/10/21  1407  --  2  ? Airway 10/21/19  --  -- 722  ? Incision (Closed) 10/21/19 Finger (Comment which one) Left 10/21/19  2112  -- 722  ? ?  ?  ? ?  ? ? ?Pertinent Labs: ? ?  Latest Ref Rng & Units 10/12/2021  ? 12:57 AM 10/11/2021  ? 12:34 AM 10/10/2021  ?  5:19 AM  ?CBC  ?WBC 4.0 - 10.5 K/uL 11.0   13.8   8.3    ?  Hemoglobin 13.0 - 17.0 g/dL 9.0   9.3   8.7    ?Hematocrit 39.0 - 52.0 % 29.9   33.3   29.6    ?Platelets 150 - 400 K/uL 96   88   92    ? ? ? ?  Latest Ref Rng & Units 10/12/2021  ? 12:57 AM 10/11/2021  ? 11:55 AM 10/11/2021  ? 12:34 AM  ?CMP  ?Glucose 70 - 99 mg/dL 181   276   241    ?BUN 8 - 23 mg/dL 52   45   45    ?Creatinine 0.61 - 1.24 mg/dL 2.57   2.16   2.00    ?Sodium 135 - 145 mmol/L 139   138   139    ?Potassium 3.5 - 5.1 mmol/L 4.7   4.0   4.2    ?Chloride 98 - 111 mmol/L 114   111   112    ?CO2 22 - 32 mmol/L _0 ?Calcium 8.9 - 10.3 mg/dL 8.9   9.1   9.1    ? ? ?Recent Labs  ?  10/11/21 ?2216 10/12/21 ?0810 10/12/21 ?1138  ?GLUCAP 141* 161* 210*  ?  ? ?Pertinent Imaging: ?No results found. ? ?ASSESSMENT/PLAN:  ?Assessment: ?Principal Problem: ?  GI bleed ?Active Problems: ?  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  Gout ?  Coronary artery disease without angina  pectoris ? ? ?Dylan [name] is a 76 year old male with a history of colonic diverticula, CKD stage III, HTN, chronic anemia, chronic hypoxic respiratory failure on 3 L, gout, non-insulin dependent T2DM, HFrEF (EF 30-35% 1/23) and A-fib on Eliquis who presented with increasing bloody stool and anemia and admitted for symptomatic anemia.  ? ?Plan: ?#HFrEF (EF 30 to 35% 1/23) ?#Hypertrophic cardiomyopathy ?#Hypertension ?Creatinine continuing to trend up at 2.57 this morning.  On exam he has JVD to jaw, lungs are clear to auscultation bilaterally, abdomen is distended, and no lower extremity edema present.  Weight is about 6 kg from admission. Thinking that worsening AKI is from cardiorenal syndrome.  Will give patient IV Lasix 40 mg and repeat BMP this afternoon to evaluate need for additional diuresis. ? ?- Lasix 40 mg once ?- Repeat BMP this afternoon ?- Hold losartan 100 mg ?  ?#Acute on chronic CKD stage IIIb ?Worsening of renal function.  Bladder scan at 64, no obstruction.  On exam he appears hypervolemic.  We will give one-time dose of IV Lasix and repeat BMP this afternoon. ? ?- Lasix 40 mg once ?- Repeat BMP this afternoon ? ?#Symptomatic anemia likely from GI bleed ?#Lower GI bleed due to diverticulosis ?Hemoglobin stable at 9 today.   Eliquis was restarted.  No further signs of bleeding. ?  ?-Continue Eliquis ?-Daily CBC ?-Follow-up pathology ?-Continue Protonix p.o. daily ?-Follow-up with GI in 2 weeks ?  ?#Chronic normocytic anemia ?#Thrombocytopenia ?Hemoglobin stable.  Abdominal appears distended this a.m, new rash noticed on left side of abdomen that is flat, red macules. INR has been elevated.  Smear was negative and haptoglobin is still pending, LDH within normal limits. Differentials include fluid overload from heart failure versus ascites from liver dysfunction.  We will further evaluate with ultrasound abdomen and PT INR with afternoon labs. ? ?-Daily CBC ?-PT/ INR ?-Ultrasound abdomen  limited ?-Transfuse if hemoglobin is less than 7 ?  ?#Persistent atrial fibrillation ?Eliquis was restarted yesterday no additional bloody bowel movements. ?  ?-  Continue Eliquis ? ?Best Practice: ?Diet: Regular diet ?IVF: Fluids: none ?VTE: SCDs Start: 10/09/21 2042 ?Code: Full ?AB: none ?Therapy Recs: none ?Family Contact: updated at bedside ?DISPO: Anticipated discharge tomorrow to Home pending Medical stability. ? ?Signature: ?Daleen Bo. Jibril Mcminn, D.O.  ?Internal Medicine Resident, PGY-1 ?[facility]  ?Pager: [phone] ?12:14 PM, 10/12/2021  ? ?Please contact the on call pager after 5 pm and on weekends at [phone].  ?

## 2021-10-13 ENCOUNTER — Inpatient Hospital Stay (HOSPITAL_COMMUNITY): Payer: Medicare HMO

## 2021-10-13 DIAGNOSIS — I422 Other hypertrophic cardiomyopathy: Secondary | ICD-10-CM | POA: Diagnosis not present

## 2021-10-13 DIAGNOSIS — I4891 Unspecified atrial fibrillation: Secondary | ICD-10-CM

## 2021-10-13 DIAGNOSIS — I5032 Chronic diastolic (congestive) heart failure: Secondary | ICD-10-CM

## 2021-10-13 DIAGNOSIS — I2510 Atherosclerotic heart disease of native coronary artery without angina pectoris: Secondary | ICD-10-CM

## 2021-10-13 LAB — PROTEIN, PLEURAL OR PERITONEAL FLUID: Total protein, fluid: 3.6 g/dL

## 2021-10-13 LAB — BODY FLUID CELL COUNT WITH DIFFERENTIAL
Eos, Fluid: 1 %
Lymphs, Fluid: 15 %
Monocyte-Macrophage-Serous Fluid: 69 % (ref 50–90)
Neutrophil Count, Fluid: 15 % (ref 0–25)
Total Nucleated Cell Count, Fluid: 863 cu mm (ref 0–1000)

## 2021-10-13 LAB — GLUCOSE, CAPILLARY
Glucose-Capillary: 186 mg/dL — ABNORMAL HIGH (ref 70–99)
Glucose-Capillary: 257 mg/dL — ABNORMAL HIGH (ref 70–99)
Glucose-Capillary: 132 mg/dL — ABNORMAL HIGH (ref 70–99)
Glucose-Capillary: 196 mg/dL — ABNORMAL HIGH (ref 70–99)

## 2021-10-13 LAB — BASIC METABOLIC PANEL
Anion gap: 8 (ref 5–15)
BUN: 49 mg/dL — ABNORMAL HIGH (ref 8–23)
CO2: 22 mmol/L (ref 22–32)
Calcium: 9.2 mg/dL (ref 8.9–10.3)
Chloride: 112 mmol/L — ABNORMAL HIGH (ref 98–111)
Creatinine, Ser: 2.63 mg/dL — ABNORMAL HIGH (ref 0.61–1.24)
GFR, Estimated: 24 mL/min — ABNORMAL LOW (ref >60)
Glucose, Bld: 241 mg/dL — ABNORMAL HIGH (ref 70–99)
Potassium: 4.2 mmol/L (ref 3.5–5.1)
Sodium: 142 mmol/L (ref 135–145)

## 2021-10-13 LAB — GLUCOSE, PLEURAL OR PERITONEAL FLUID: Glucose, Fluid: 253 mg/dL

## 2021-10-13 LAB — HEPATIC FUNCTION PANEL
ALT: 18 U/L (ref 0–44)
AST: 20 U/L (ref 15–41)
Albumin: 3.7 g/dL (ref 3.5–5.0)
Alkaline Phosphatase: 81 U/L (ref 38–126)
Bilirubin, Direct: 0.3 mg/dL — ABNORMAL HIGH (ref 0.0–0.2)
Indirect Bilirubin: 0.7 mg/dL (ref 0.3–0.9)
Total Bilirubin: 1 mg/dL (ref 0.3–1.2)
Total Protein: 6.9 g/dL (ref 6.5–8.1)

## 2021-10-13 LAB — CBC
HCT: 31.5 % — ABNORMAL LOW (ref 39.0–52.0)
Hemoglobin: 9.3 g/dL — ABNORMAL LOW (ref 13.0–17.0)
MCH: 26.4 pg (ref 26.0–34.0)
MCHC: 29.5 g/dL — ABNORMAL LOW (ref 30.0–36.0)
MCV: 89.5 fL (ref 80.0–100.0)
Platelets: 91 10*3/uL — ABNORMAL LOW (ref 150–400)
RBC: 3.52 MIL/uL — ABNORMAL LOW (ref 4.22–5.81)
RDW: 17.7 % — ABNORMAL HIGH (ref 11.5–15.5)
WBC: 12.2 10*3/uL — ABNORMAL HIGH (ref 4.0–10.5)
nRBC: 1.6 % — ABNORMAL HIGH (ref 0.0–0.2)

## 2021-10-13 LAB — GRAM STAIN

## 2021-10-13 LAB — ALBUMIN, PLEURAL OR PERITONEAL FLUID: Albumin, Fluid: 2.1 g/dL

## 2021-10-13 LAB — HEPATITIS C ANTIBODY: HCV Ab: NONREACTIVE

## 2021-10-13 LAB — UREA NITROGEN, URINE: Urea Nitrogen, Ur: 837 mg/dL

## 2021-10-13 LAB — HEPATITIS B CORE ANTIBODY, IGM: Hep B C IgM: NONREACTIVE

## 2021-10-13 LAB — HEPATITIS B SURFACE ANTIGEN: Hepatitis B Surface Ag: NONREACTIVE

## 2021-10-13 LAB — HAPTOGLOBIN: Haptoglobin: 100 mg/dL (ref 34–355)

## 2021-10-13 MED ORDER — APIXABAN 5 MG PO TABS
5.0000 mg | ORAL_TABLET | Freq: Two times a day (BID) | ORAL | Status: DC
  Administered 2021-10-14 – 2021-10-17 (×7): 5 mg via ORAL
  Filled 2021-10-13 (×7): qty 1

## 2021-10-13 MED ORDER — LIDOCAINE HCL (PF) 1 % IJ SOLN
INTRAMUSCULAR | Status: AC
  Filled 2021-10-13: qty 30

## 2021-10-13 MED ORDER — SPIRONOLACTONE 12.5 MG HALF TABLET
12.5000 mg | ORAL_TABLET | Freq: Every day | ORAL | Status: DC
Start: 2021-10-14 — End: 2021-10-14
  Administered 2021-10-14: 12.5 mg via ORAL
  Filled 2021-10-13: qty 1

## 2021-10-13 MED ORDER — FUROSEMIDE 40 MG PO TABS
40.0000 mg | ORAL_TABLET | Freq: Every day | ORAL | Status: DC
Start: 2021-10-14 — End: 2021-10-15
  Administered 2021-10-14: 40 mg via ORAL
  Filled 2021-10-13: qty 1

## 2021-10-13 MED ORDER — LACTULOSE 10 GM/15ML PO SOLN
20.0000 g | Freq: Every day | ORAL | Status: DC
  Administered 2021-10-13 – 2021-10-15 (×3): 20 g via ORAL
  Filled 2021-10-13 (×3): qty 30

## 2021-10-13 MED ORDER — SPIRONOLACTONE 12.5 MG HALF TABLET
12.5000 mg | ORAL_TABLET | Freq: Every day | ORAL | Status: DC
  Administered 2021-10-13: 12.5 mg via ORAL
  Filled 2021-10-13: qty 1

## 2021-10-13 NOTE — Progress Notes (Signed)
Subjective: ?Feeling better after the paracentesis. ? ?Objective: ?Vital signs in last 24 hours: ?Temp:  [98.2 ?F (36.8 ?C)-99.1 ?F (37.3 ?C)] 99.1 ?F (37.3 ?C) (04/08 1245) ?Pulse Rate:  [59-75] 71 (04/08 1245) ?Resp:  [18-20] 18 (04/08 1245) ?BP: (125-173)/(73-94) 125/79 (04/08 1245) ?SpO2:  [84 %-98 %] 98 % (04/08 1245) ?Last BM Date : 10/12/21 ? ?Intake/Output from previous day: ?04/07 0701 - 04/08 0700 ?In: 76 [P.O.:460] ?Out: 1800 [Urine:1800] ?Intake/Output this shift: ?Total I/O ?In: 120 [P.O.:120] ?Out: 800 [Urine:800] ? ?General appearance: alert and no distress ?GI: soft, non-tender; bowel sounds normal; no masses,  no organomegaly ? ?Lab Results: ?Recent Labs  ?  10/11/21 ?0034 10/12/21 ?8921 10/13/21 ?0101  ?WBC 13.8* 11.0* 12.2*  ?HGB 9.3* 9.0* 9.3*  ?HCT 33.3* 29.9* 31.5*  ?PLT 88* 96* 91*  ? ?BMET ?Recent Labs  ?  10/12/21 ?1941 10/12/21 ?1413 10/13/21 ?0101  ?NA 139 141 142  ?K 4.7 3.8 4.2  ?CL 114* 112* 112*  ?CO2 16* 21* 22  ?GLUCOSE 181* 204* 241*  ?BUN 52* 52* 49*  ?CREATININE 2.57* 2.55* 2.63*  ?CALCIUM 8.9 9.2 9.2  ? ?LFT ?Recent Labs  ?  10/13/21 ?0101  ?PROT 6.9  ?ALBUMIN 3.7  ?AST 20  ?ALT 18  ?ALKPHOS 81  ?BILITOT 1.0  ?BILIDIR 0.3*  ?IBILI 0.7  ? ?PT/INR ?Recent Labs  ?  10/11/21 ?7408 10/12/21 ?1413  ?LABPROT 20.3* 25.3*  ?INR 1.8* 2.3*  ? ?Hepatitis Panel ?Recent Labs  ?  10/12/21 ?1413  ?HEPBSAG NON REACTIVE  ?HCVAB NON REACTIVE  ?HEPBIGM NON REACTIVE  ? ?C-Diff ?No results for input(s): CDIFFTOX in the last 72 hours. ?Fecal Lactopherrin ?No results for input(s): FECLLACTOFRN in the last 72 hours. ? ?Studies/Results: ?US RENAL ? ?Result Date: 10/13/2021 ?CLINICAL DATA:  AK I EXAM: RENAL / URINARY TRACT ULTRASOUND COMPLETE COMPARISON:  August 03, 2021 ultrasound FINDINGS: Right Kidney: Renal measurements: 10.4 x 5.3 x 6.1 cm = volume: 174 mL. Echogenicity within normal limits. No suspicious mass or hydronephrosis visualized. Several benign simple cysts are noted (for which no dedicated  imaging follow-up is recommended). Largest measures 1.4 x 1.5 x 1.5 cm. Left Kidney: Renal measurements: 10.7 x 5.0 x 5.6 cm = volume: 156 mL. Echogenicity within normal limits. No suspicious mass or hydronephrosis visualized. Several benign simple cysts are noted (for which no dedicated imaging follow-up is recommended). Largest measures 1.1 x 1.0 x 0.9 cm. Bladder: Appears normal for degree of bladder distention. Other: Ascites is incidentally noted IMPRESSION: No hydronephrosis. Electronically Signed   By: Valentino Saxon M.D.   On: 10/13/2021 12:35  ? ?US Paracentesis ? ?Result Date: 10/13/2021 ?INDICATION: Patient with history of congestive heart failure, cardiomyopathy, CKD with new onset ascites of unknown etiology. Request IR for diagnostic and therapeutic paracentesis with 5 L maximum. EXAM: ULTRASOUND GUIDED DIAGNOSTIC AND THERAPEUTIC PARACENTESIS MEDICATIONS: 8 mL% lidocaine COMPLICATIONS: None immediate. PROCEDURE: Informed written consent was obtained from the patient after a discussion of the risks, benefits and alternatives to treatment. A timeout was performed prior to the initiation of the procedure. Initial ultrasound scanning demonstrates a moderate amount of ascites within the right lower abdominal quadrant. The right lower abdomen was prepped and draped in the usual sterile fashion. 1% lidocaine was used for local anesthesia. Following this, a 19 gauge, 7-cm, Yueh catheter was introduced. An ultrasound image was saved for documentation purposes. The paracentesis was performed. The catheter was removed and a dressing was applied. The patient tolerated the procedure well without  immediate post procedural complication. FINDINGS: A total of approximately 2.7 L of clear, golden yellow fluid was removed. Samples were sent to the laboratory as requested by the clinical team. IMPRESSION: Successful ultrasound-guided paracentesis yielding 2.7 liters of peritoneal fluid. Read by Candiss Norse, PA-C  Electronically Signed   By: Lucrezia Europe M.D.   On: 10/13/2021 13:21  ? ?US Abdomen Limited RUQ (LIVER/GB) ? ?Result Date: 10/12/2021 ?CLINICAL DATA:  Ascites.  Previous cholecystectomy. EXAM: ULTRASOUND ABDOMEN LIMITED RIGHT UPPER QUADRANT COMPARISON:  CT 10/09/2021 FINDINGS: Gallbladder: Previous cholecystectomy. Common bile duct: Diameter: 5.4 mm, normal. Liver: Increased echogenicity with mild lobulations of the surface of the liver consistent with cirrhosis. No focal liver lesion is seen. Ascites, diffusely distributed. Other: None. IMPRESSION: Cirrhosis of the liver. Ascites. No acute or focal finding otherwise. Electronically Signed   By: Nelson Chimes M.D.   On: 10/12/2021 16:55   ? ?Medications: Scheduled: ? allopurinol  100 mg Oral Daily  ? [START ON 10/14/2021] apixaban  5 mg Oral BID  ? aspirin EC  81 mg Oral Daily  ? atorvastatin  40 mg Oral QHS  ? ferrous sulfate  325 mg Oral Q breakfast  ? [START ON 10/14/2021] furosemide  40 mg Oral Daily  ? hydrALAZINE  50 mg Oral TID  ? insulin aspart  0-9 Units Subcutaneous TID WC  ? insulin aspart  2 Units Subcutaneous TID WC  ? isosorbide mononitrate  60 mg Oral Daily  ? lactulose  20 g Oral Daily  ? pantoprazole  40 mg Oral Daily  ? [START ON 10/14/2021] spironolactone  12.5 mg Oral Daily  ? ?Continuous: ? cefTRIAXone (ROCEPHIN)  IV 1 g (10/12/21 1854)  ? ? ?Assessment/Plan: ?1) Cirrhosis. ?2) Ascites. ?3) Recent GI bleed. ?4) HFrEF. ?5) Renal insufficiency. ? ? The patient's SAAG is at 1.6.  Cardiology evaluated the patient and it does not appear that his heart is the source of his fluid overload.  He feels better after the paracentesis. ? ?Plan: ?1) Continue with lasix. ?2) Monitor renal function. ?3) If his creatinine can get back to less than 1.8 spironolactone can be used. ? LOS: 3 days  ? ?Adolfo Granieri D ?10/13/2021, 4:08 PM  ?

## 2021-10-13 NOTE — Progress Notes (Signed)
Mobility Specialist Progress Note  ? ? 10/13/21 1001  ?Mobility  ?Activity Ambulated with assistance in hallway  ?Level of Assistance Standby assist, set-up cues, supervision of patient - no hands on  ?Assistive Device Front wheel walker  ?Distance Ambulated (ft) 195 ft  ?Activity Response Tolerated well  ?$Mobility charge 1 Mobility  ? ?Pt received in bed and agreeable. No complaints on walk. Ambulated on 3LO2. Returned to chair with call bell in reach.   ? ?Pt stated he is concerned about if he is having a procedure today or not and that everyone is giving him different answers and he doesn't know what is going on.  ? ?Hildred Alamin ?Mobility Specialist  ?  ?

## 2021-10-13 NOTE — Procedures (Signed)
PROCEDURE SUMMARY: ? ?Successful image-guided paracentesis from the right lower abdomen.  ?Yielded 2.7 liters of clear, golden yellow fluid.  ?No immediate complications.  ?EBL < 1 mL ?Patient tolerated well.  ? ?Specimen was sent for labs. ? ?Please see imaging section of Epic for full dictation. ? ?Joaquim Nam PA-C ?10/13/2021 ?11:47 AM ? ? ? ?

## 2021-10-13 NOTE — Progress Notes (Signed)
Patient had 5 runs of Vtach,  3.26 max  pause and decreased heart rate with lowest at 30, On call provider notified. EKG done per verbal orders from provider.  ?

## 2021-10-13 NOTE — Evaluation (Addendum)
Occupational Therapy Evaluation ?Patient Details ?Name: Dylan [name] ?MRN: [id number] ?DOB: Jul 06, 1946 ?Today's Date: 10/13/2021 ? ? ?History of Present Illness 76 year old male with a history of colonic diverticula, CKD stage III, HTN, chronic anemia, chronic hypoxic respiratory failure on 3 L, gout, non-insulin dependent T2DM, HFrEF and A-fib on Eliquis presented complaining of blood in stool for the past 3 weeks  ? ?Clinical Impression ?  ?Patient admitted for the diagnosis above.  PTA he lives alone in an apartment, uses a RW at times, receives assist for community mobility from family, and is Mod I with ADL/IADL at baseline.  Patient has no real complaints other than feeling a little weak.  Currently he appears at, or near, his baseline for in room mobility, and ADL completion from sit/stand level. Patient was able to use the restroom, and sponge bathe at the sink without assist.  No acute OT needs identified, and no post acute needs anticipated.     ?   ? ?Recommendations for follow up therapy are one component of a multi-disciplinary discharge planning process, led by the attending physician.  Recommendations may be updated based on patient status, additional functional criteria and insurance authorization.  ? ?Follow Up Recommendations ? No OT follow up  ?  ?Assistance Recommended at Discharge Intermittent Supervision/Assistance  ?Patient can return home with the following   ? ?  ?Functional Status Assessment ? Patient has not had a recent decline in their functional status  ?Equipment Recommendations ? None recommended by OT  ?  ?Recommendations for Other Services   ? ? ?  ?Precautions / Restrictions Precautions ?Precautions: Fall ?Restrictions ?Weight Bearing Restrictions: No  ? ?  ? ?Mobility Bed Mobility ?Overal bed mobility: Modified Independent ?  ?  ?  ?  ?  ?  ?  ?  ? ?Transfers ?Overall transfer level: Modified independent ?  ?  ?  ?  ?  ?  ?  ?  ?General transfer comment: patient walking in room ad lib  without RW ?  ? ?  ?Balance Overall balance assessment: Mild deficits observed, not formally tested ?  ?  ?  ?  ?  ?  ?  ?  ?  ?  ?  ?  ?  ?  ?  ?  ?  ?  ?   ? ?ADL either performed or assessed with clinical judgement  ? ?ADL Overall ADL's : At baseline ?  ?  ?  ?  ?  ?  ?  ?  ?  ?  ?  ?  ?  ?  ?  ?  ?  ?  ?  ?   ? ? ? ?Vision Patient Visual Report: No change from baseline ?   ?   ?Perception Perception ?Perception: Not tested ?  ?Praxis Praxis ?Praxis: Not tested ?  ? ?Pertinent Vitals/Pain Pain Assessment ?Pain Assessment: No/denies pain  ? ? ? ?Hand Dominance Right ?  ?Extremity/Trunk Assessment Upper Extremity Assessment ?Upper Extremity Assessment: Overall WFL for tasks assessed ?  ?Lower Extremity Assessment ?Lower Extremity Assessment: Defer to PT evaluation ?  ?Cervical / Trunk Assessment ?Cervical / Trunk Assessment: Kyphotic ?  ?Communication Communication ?Communication: HOH ?  ?Cognition Arousal/Alertness: Awake/alert ?Behavior During Therapy: [facility] for tasks assessed/performed ?Overall Cognitive Status: Within Functional Limits for tasks assessed ?  ?  ?  ?  ?  ?  ?  ?  ?  ?  ?  ?  ?  ?  ?  ?  ?  ?  ?  ?  General Comments   VSS on RA ? ?  ?   ?  ?    ? ? ?Home Living Family/patient expects to be discharged to:: Private residence ?Living Arrangements: Alone ?Available Help at Discharge: Family;Available PRN/intermittently ?Type of Home: Apartment ?Home Access: Stairs to enter ?Entrance Stairs-Number of Steps: 8 ?Entrance Stairs-Rails: Right ?Home Layout: One level ?  ?  ?Bathroom Shower/Tub: Tub/shower unit ?  ?Bathroom Toilet: Standard ?Bathroom Accessibility: Yes ?How Accessible: Accessible via walker ?Home Equipment: Shower seat;Standard Walker;Cane - quad ?  ?Additional Comments: granddaughter helps with groceries and transportation to doctor ?  ? ?  ?Prior Functioning/Environment Prior Level of Function : Independent/Modified Independent ?  ?  ?  ?  ?  ?  ?Mobility Comments: using standard walker ?ADLs  Comments: sits to shower, independent in self care, meal prep, light houskeeping ?  ? ?  ?  ?OT Problem List: Decreased activity tolerance ?  ?   ?OT Treatment/Interventions:    ?  ?OT Goals(Current goals can be found in the care plan section) Acute Rehab OT Goals ?Patient Stated Goal: Hoping to return home by tomorrow ?OT Goal Formulation: With patient ?Time For Goal Achievement: 10/15/21 ?Potential to Achieve Goals: Good  ?OT Frequency:   ?  ? ?Co-evaluation   ?  ?  ?  ?  ? ?  ?AM-PAC OT "6 Clicks" Daily Activity     ?Outcome Measure Help from another person eating meals?: None ?Help from another person taking care of personal grooming?: None ?Help from another person toileting, which includes using toliet, bedpan, or urinal?: None ?Help from another person bathing (including washing, rinsing, drying)?: None ?Help from another person to put on and taking off regular upper body clothing?: None ?Help from another person to put on and taking off regular lower body clothing?: None ?6 Click Score: 24 ?  ?End of Session Nurse Communication: Other (comment) (foley emptied) ? ?Activity Tolerance: Patient tolerated treatment well ?Patient left: in bed;with call bell/phone within reach ? ?OT Visit Diagnosis: Unsteadiness on feet (R26.81)  ?              ?Time: 8016-5537 ?OT Time Calculation (min): 21 min ?Charges:  OT General Charges ?$OT Visit: 1 Visit ?OT Evaluation ?$OT Eval Moderate Complexity: 1 Mod ? ?10/13/2021 ? ?RP, OTR/L ? ?Acute Rehabilitation Services ? ?Office:  [phone] ? ? ?Mujahid Jalomo D Shavonn Convey ?10/13/2021, 3:45 PM ?

## 2021-10-13 NOTE — Progress Notes (Signed)
? ? ?HD#3 ?Subjective:  ?Overnight Events: No acute events overnight ? ? This morning Dylan [name] is resting in bed comfortably. He denies any pain and feels as though his abdominal swelling has improved. He notes feeling confused and believes he was admitted for his stomach swelling. We discussed he was initially admitted for suspected GI bleed and began having worsening abdominal swelling yesterday. He has no other acute concerns at this time.  ? ?Objective:  ?Vital signs in last 24 hours: ?Vitals:  ? 10/13/21 1145 10/13/21 1150 10/13/21 1155 10/13/21 1245  ?BP: 132/82 (!) 143/94 (!) 157/82 125/79  ?Pulse:    71  ?Resp:    18  ?Temp:    99.1 ?F (37.3 ?C)  ?TempSrc:    Oral  ?SpO2:    98%  ?Weight:      ?Height:      ? ?Supplemental O2: Room Air ?SpO2: 98 % ?O2 Flow Rate (L/min): 3 L/min ? ? ?Physical Exam:  ?Constitutional: well appearing, no acute distress ?HENT: normocephalic atraumatic ?Eyes: conjunctiva non-erythematous.  ?Neck: supple. No JVD ?Cardiovascular: irregular rhythm, no murmurs, gallops, rubs ?Pulmonary/Chest: normal work of breathing on home O2, lungs clear to auscultation bilaterally ?Abdominal: soft, distended, non tender ?MSK: normal bulk and tone ?Neurological: alert & oriented to self, place, but not time or situation.  ?Skin: warm and dry. No telangiectasias. Non-palpable purpura on abdomen and left arm ?Psych: normal mood ? ? ? ? ?Filed Weights  ? 10/09/21 1403 10/10/21 1158 10/12/21 0627  ?Weight: 78 kg 70.3 kg 84.1 kg  ? ? ? ?Intake/Output Summary (Last 24 hours) at 10/13/2021 1424 ?Last data filed at 10/13/2021 1245 ?Gross per 24 hour  ?Intake 120 ml  ?Output 2600 ml  ?Net -2480 ml  ? ?Net IO Since Admission: -1,855 mL [10/13/21 1424] ? ?Pertinent Labs: ? ?  Latest Ref Rng & Units 10/13/2021  ?  1:01 AM 10/12/2021  ? 12:57 AM 10/11/2021  ? 12:34 AM  ?CBC  ?WBC 4.0 - 10.5 K/uL 12.2   11.0   13.8    ?Hemoglobin 13.0 - 17.0 g/dL 9.3   9.0   9.3    ?Hematocrit 39.0 - 52.0 % 31.5   29.9   33.3     ?Platelets 150 - 400 K/uL 91   96   88    ? ? ? ?  Latest Ref Rng & Units 10/13/2021  ?  1:01 AM 10/12/2021  ?  2:13 PM 10/12/2021  ? 12:57 AM  ?CMP  ?Glucose 70 - 99 mg/dL 241   204   181    ?BUN 8 - 23 mg/dL 49   52   52    ?Creatinine 0.61 - 1.24 mg/dL 2.63   2.55   2.57    ?Sodium 135 - 145 mmol/L 142   141   139    ?Potassium 3.5 - 5.1 mmol/L 4.2   3.8   4.7    ?Chloride 98 - 111 mmol/L 112   112   114    ?CO2 22 - 32 mmol/L _0 ?Calcium 8.9 - 10.3 mg/dL 9.2   9.2   8.9    ?Total Protein 6.5 - 8.1 g/dL 6.9   6.6     ?Total Bilirubin 0.3 - 1.2 mg/dL 1.0   1.0     ?Alkaline Phos 38 - 126 U/L 81   77     ?AST 15 - 41 U/L 20  18     ?ALT 0 - 44 U/L 18   18     ? ? ?Imaging: ?US RENAL ? ?Result Date: 10/13/2021 ?CLINICAL DATA:  AK I EXAM: RENAL / URINARY TRACT ULTRASOUND COMPLETE COMPARISON:  August 03, 2021 ultrasound FINDINGS: Right Kidney: Renal measurements: 10.4 x 5.3 x 6.1 cm = volume: 174 mL. Echogenicity within normal limits. No suspicious mass or hydronephrosis visualized. Several benign simple cysts are noted (for which no dedicated imaging follow-up is recommended). Largest measures 1.4 x 1.5 x 1.5 cm. Left Kidney: Renal measurements: 10.7 x 5.0 x 5.6 cm = volume: 156 mL. Echogenicity within normal limits. No suspicious mass or hydronephrosis visualized. Several benign simple cysts are noted (for which no dedicated imaging follow-up is recommended). Largest measures 1.1 x 1.0 x 0.9 cm. Bladder: Appears normal for degree of bladder distention. Other: Ascites is incidentally noted IMPRESSION: No hydronephrosis. Electronically Signed   By: Valentino Saxon M.D.   On: 10/13/2021 12:35  ? ?US Paracentesis ? ?Result Date: 10/13/2021 ?INDICATION: Patient with history of congestive heart failure, cardiomyopathy, CKD with new onset ascites of unknown etiology. Request IR for diagnostic and therapeutic paracentesis with 5 L maximum. EXAM: ULTRASOUND GUIDED DIAGNOSTIC AND THERAPEUTIC PARACENTESIS MEDICATIONS:  8 mL% lidocaine COMPLICATIONS: None immediate. PROCEDURE: Informed written consent was obtained from the patient after a discussion of the risks, benefits and alternatives to treatment. A timeout was performed prior to the initiation of the procedure. Initial ultrasound scanning demonstrates a moderate amount of ascites within the right lower abdominal quadrant. The right lower abdomen was prepped and draped in the usual sterile fashion. 1% lidocaine was used for local anesthesia. Following this, a 19 gauge, 7-cm, Yueh catheter was introduced. An ultrasound image was saved for documentation purposes. The paracentesis was performed. The catheter was removed and a dressing was applied. The patient tolerated the procedure well without immediate post procedural complication. FINDINGS: A total of approximately 2.7 L of clear, golden yellow fluid was removed. Samples were sent to the laboratory as requested by the clinical team. IMPRESSION: Successful ultrasound-guided paracentesis yielding 2.7 liters of peritoneal fluid. Read by Candiss Norse, PA-C Electronically Signed   By: Lucrezia Europe M.D.   On: 10/13/2021 13:21  ? ?US Abdomen Limited RUQ (LIVER/GB) ? ?Result Date: 10/12/2021 ?CLINICAL DATA:  Ascites.  Previous cholecystectomy. EXAM: ULTRASOUND ABDOMEN LIMITED RIGHT UPPER QUADRANT COMPARISON:  CT 10/09/2021 FINDINGS: Gallbladder: Previous cholecystectomy. Common bile duct: Diameter: 5.4 mm, normal. Liver: Increased echogenicity with mild lobulations of the surface of the liver consistent with cirrhosis. No focal liver lesion is seen. Ascites, diffusely distributed. Other: None. IMPRESSION: Cirrhosis of the liver. Ascites. No acute or focal finding otherwise. Electronically Signed   By: Nelson Chimes M.D.   On: 10/12/2021 16:55   ? ?Assessment/Plan:  ? ?Principal Problem: ?  GI bleed ?Active Problems: ?  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  Gout ?  Coronary artery disease  without angina pectoris ? ? ?Patient Summary: ?Dylan [name] is a 76 year old male with a history of colonic diverticula, CKD stage III, HTN, chronic anemia, chronic hypoxic respiratory failure on 3 L, gout, non-insulin dependent T2DM, HFrEF (EF 30-35% 1/23) and A-fib on Eliquis who presented with increasing bloody stool and anemia and admitted for symptomatic anemia.  ?  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  In the past he  has had some hepatic steatosis on abdominal ultrasounds.  He presented with worsening ascites and thrombocytopenia as well as elevated PT and INR.  Right quadrant ultrasound with nodularity consistent with cirrhosis.  Suspect he is in an acute decompensated state.  Ascites has decreased independently from yesterday however still present. EGD done on this admission negative for varices.  With some worsening confusion today we will go ahead and start lactulose and monitor with goal of 3 bowel movements per day.  Child class II/III.  MELD score of 25, 19% 39-monthmortality he will need follow-up with outpatient GI.  I have reached back out to Dr. HBenson Norwaywho performed patient's upper endoscopy they will sign back on and follow-up patient IR to see patient today for diagnostic paracentesis with labs to follow. ?-GI to see patient, appreciate their assistance. Will need to follow as outpatient ?-Had improvement of ascites this morning with lasix, will transition to home lasix and start spiro ?-Start lactulose 20 g daily, goal 3 BMs per day ?-Diagnostic paracentesis today with labs to follow ?-Continue ceftriaxone daily day 2/7 for SBP prophylaxis ? ?Acute on CKD stg IIIb ?UA unremarkable.  Renal ultrasound negative. Will continue with diuretic therapy to see if he has improvement of AKI. May have component of hepatorenal syndrome, if no improvement  tomorrow with diuretics will consider stopping and starting albumin. ?-Continue lasix + spiro  ?-Daily BMP ? ?Symptomatic Anemia ?Thought to be secondary to upper GI bleed. Eliquis held overnight for paracen

## 2021-10-13 NOTE — Consult Note (Signed)
?Cardiology Consultation:  ?Patient ID: Dylan [name] ?MRN: [id number]; DOB: Sep 06, 1945 ? ?Admit date: 10/09/2021 ?Date of Consult: 10/13/2021 ? ?Primary Care Provider: Angelica Pou, MD ?Primary Cardiologist: Evalina Field, MD  ?Primary Electrophysiologist:  None  ? ?Patient Profile:  ?Dylan [name] is a 76 y.o. 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 ? ?History of Present Illness:  ?Dylan [name] was admitted to the hospital on 10/09/2021 with symptomatic anemia in the setting of GI bleed.  He has undergone EGD which demonstrated gastritis.  No active bleeding was found.  His Eliquis was held but has been restarted.  His course also has been complicated by acute on chronic systolic heart failure.  He suffered an acute kidney injury and his creatinine continues to climb.  It appears his volume has been largely related to ascites.  He has no evidence of JVD or lower extremity edema.  Right upper quadrant ultrasound demonstrates cirrhosis of liver with ascites.  He was noted to have A-fib with slow ventricular response overnight.  Cardiology was consulted for this.  He reports no symptoms.  He describes no dizziness or lightheadedness.  Heart rate has been down into the 30s without symptoms.  He tells me he has walked the halls without any limitations and would like to go home if possible.  We also discussed that his volume status is acceptable.  I informed him that he does not need IV diuresis but would like to discuss this with [name] before making any decision.  He would like to [name] and her team to make decisions. ? ?Heart Pathway Score:   ?   ? ?Past Medical History: ?Past Medical History:  ?Diagnosis Date  ? Anemia 10/18/2014  ? Baseline  about 12 and stable from 2010 to 2016. Colon 2009 in [place] (records cannot be obtained).  EGD Dr Benson Norway 2011 nl   ? Aortic atherosclerosis ([place]) 03/28/2020  ? Incidental finding on imaging CT   ? Atherosclerosis 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 ([place]) 12/28/2014  ? ABI Feb 2017 R 0.49; L 0.72 with diffuse dz ABI Aug 2017 R 0.49; L 0.69 ABI Jan 2018 R 0.61; L 0.73  ABI Feb 2019 R 0.55; L 0.71 Sees Dr Bridgett Larsson - recs ABI q 6 months  ? 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 Noted ECHO 10/2014. Grade 2. EF 50-55%; echo repeated 2019, severe hypertrophy with elevated filling pressures  ? Chronic kidney disease, stage 3b ([place]) 03/09/2020  ? Dr. Justin Mend  Nephrologist, f/u Q 31M  ? Coronary artery disease without angina pectoris   ? Degloving injury of finger 03/28/2020  ? 10/21/19: copied from op note "1.  Open reduction percutaneous pinning left small finger proximal phalanx fracture 2.  Complex repair of laceration to left small finger 3 cm in length 3.  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 No significant callus formation is noted. Pins had been removed  ? Diverticulosis 10/08/2014  ? Seen on CT. Reportedly on Colon in [place] in 2009. Freq bouts of diverticulitis.  ? Dizziness due to orthostatic hypotensioni in setting of wt loss, resolved 01/12/2021  ? DM (diabetes mellitus),  type 2 with renal complications ([place]) 23/76/2831  ? Former tobacco use 10/08/2014  ? Gout   ? H/O gastrointestinal diverticular hemorrhage 11/21/2020  ? Hx of recent orthostatic hypotension (early summer 2022) 11/28/2020  ? 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 Baseline Cr about 1.5. Stable from 2010 to 2016.  Negative SPEP and UPEP 2014 after ARF 2/2 continued ACE (lisinopril 20) use while vol contracted. Dr Justin Mend  ? Obesity (BMI 30.0-34.9)  03/09/2020  ? 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 OSA (obstructive sleep apnea) 10/08/2014  ? July 2016 : Severe OSA/hypopnea syndrome, AHI 128.4, O2 nadir of 84% RA. Failed CPAP on study. BiPA inspiratory pressure of 21 and expiratory pressure of 17 CWP. Consider ENT evaluation for potentially correctable upper airway obstruction contributing to the need for unusually high pressures - ENT July 2015 did not feel any intervention surgically was indicated. He wore a large F&P Simplus fullfac  ? Personal history of colonic polyps 03/28/2014  ? Dr. Benson Norway, 2 polyps removed 2015.  No polyps on f/u in 2020.  No further surveillance suggested per Dr. Benson Norway.  ? 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 Chronic problem, evaluated by ENT in the past, with recommendation for surgery which she has been hesitant to consider.  He is unable to breathe through his nose comfortably.  This is part of the reason why he does not wear his oxygen regularly.  Nasal passages are nearly completely obstructed erythematous smooth glistening surface.  He has been told by his eye doctor that part of the reason his e  ? Resistant hypertension 10/09/2014  ? Poor control with 6 drug therapy. 2016 : Aldo 37 but ARR 23.5   ? Severe pulmonary arterial systolic hypertension ([place]) 10/18/2014  ? Noted as severe ECHO 2016 and 2019. Likely 2/2 severe untreated OSA. Pt is not adherent to CPAP.  ? Syncope 12/31/2020  ? Thrombocytopenia ([place]) 10/09/2016  ? Nl liver and spleen on [place] 2019  ? Ulcer aphthous oral 04/27/2020  ? Evaluated emergency room last week, saw with Magic mouthwash  ? Unintentional weight loss due to Trulicity, resolved 11/22/6158  ? ? ?Past Surgical History: ?Past Surgical History:  ?Procedure Laterality Date  ? BIOPSY  10/10/2021  ? Procedure: BIOPSY;  Surgeon: Carol Ada, MD;  Location: [facility];  Service: Gastroenterology;;  ? CHOLECYSTECTOMY    ? COLONOSCOPY WITH PROPOFOL N/A 02/12/2019  ? Procedure: COLONOSCOPY WITH PROPOFOL;   Surgeon: Carol Ada, MD;  Location: [facility];  Service: Endoscopy;  Laterality: N/A;  ? ESOPHAGOGASTRODUODENOSCOPY (EGD) WITH PROPOFOL N/A 10/10/2021  ? Procedure: ESOPHAGOGASTRODUODENOSCOPY (EGD) WITH PROPOFOL;  Surgeon: Carol Ada, MD;  Location: [facility];  Service: Gastroenterology;  Laterality: N/A;  ? PERCUTANEOUS PINNING Left 10/21/2019  ? Procedure: CLOSED REDUCTION WITH PERCUTANEOUS PINNING AND SUTURE REPAIR OF LACERATION  OF SMALL FINGER,  SUTURE REPAIR OF LACERATION OF INDEX FINGER;  Surgeon: Cindra Presume, MD;  Location: [facility];  Service: Plastics;  Laterality: Left;  ? RIGHT/LEFT HEART CATH AND CORONARY ANGIOGRAPHY N/A 08/09/2021  ? Procedure: RIGHT/LEFT HEART CATH AND CORONARY ANGIOGRAPHY;  Surgeon: Troy Sine, MD;  Location: [facility];  Service: Cardiovascular;  Laterality: N/A;  ?  ? ?Home Medications:  ?Prior to Admission medications   ?Medication Sig Start Date End Date Taking? Authorizing Provider  ?acetaminophen (TYLENOL) 500 MG tablet Take 500 mg by mouth every 6 (six) hours as needed for mild pain.   Yes [provider]  ?  allopurinol (ZYLOPRIM) 100 MG tablet Take 1 tablet (100 mg total) by mouth daily. 12/11/20  Yes Angelica Pou, MD  ?apixaban (ELIQUIS) 5 MG TABS tablet Take 1 tablet (5 mg total) by mouth 2 (two) times daily. 08/31/21  Yes Angelica Pou, MD  ?aspirin EC 81 MG tablet Take 81 mg by mouth daily. Swallow whole.   Yes [provider]  ?atorvastatin (LIPITOR) 40 MG tablet Take 1 tablet (40 mg total) by mouth daily. 02/21/21  Yes Angelica Pou, MD  ?carvedilol (COREG) 25 MG tablet Take 1 tablet (25 mg total) by mouth 2 (two) times daily with a meal. 04/30/21  Yes Virl Axe, MD  ?furosemide (LASIX) 40 MG tablet Take 1 tablet (40 mg total) by mouth daily. 08/31/21 02/27/22 Yes Angelica Pou, MD  ?hydrALAZINE (APRESOLINE) 50 MG tablet Take 1 tablet (50 mg total) by mouth 3 (three) times daily. 08/31/21  Yes Angelica Pou, MD  ?isosorbide mononitrate (IMDUR) 60 MG 24 hr tablet Take 1 tablet (60 mg total) by mouth daily. 08/31/21  Yes Angelica Pou, MD  ?latanoprost (XALATAN) 0.005 % ophthalmic solution Pl

## 2021-10-14 DIAGNOSIS — N179 Acute kidney failure, unspecified: Secondary | ICD-10-CM

## 2021-10-14 LAB — MAGNESIUM: Magnesium: 2.1 mg/dL (ref 1.7–2.4)

## 2021-10-14 LAB — HEPATIC FUNCTION PANEL
ALT: 19 U/L (ref 0–44)
AST: 21 U/L (ref 15–41)
Albumin: 3.7 g/dL (ref 3.5–5.0)
Alkaline Phosphatase: 87 U/L (ref 38–126)
Bilirubin, Direct: 0.3 mg/dL — ABNORMAL HIGH (ref 0.0–0.2)
Indirect Bilirubin: 1 mg/dL — ABNORMAL HIGH (ref 0.3–0.9)
Total Bilirubin: 1.3 mg/dL — ABNORMAL HIGH (ref 0.3–1.2)
Total Protein: 7 g/dL (ref 6.5–8.1)

## 2021-10-14 LAB — CBC
HCT: 31.1 % — ABNORMAL LOW (ref 39.0–52.0)
Hemoglobin: 9.2 g/dL — ABNORMAL LOW (ref 13.0–17.0)
MCH: 26.7 pg (ref 26.0–34.0)
MCHC: 29.6 g/dL — ABNORMAL LOW (ref 30.0–36.0)
MCV: 90.4 fL (ref 80.0–100.0)
Platelets: 84 10*3/uL — ABNORMAL LOW (ref 150–400)
RBC: 3.44 MIL/uL — ABNORMAL LOW (ref 4.22–5.81)
RDW: 19 % — ABNORMAL HIGH (ref 11.5–15.5)
WBC: 11.1 10*3/uL — ABNORMAL HIGH (ref 4.0–10.5)
nRBC: 1.3 % — ABNORMAL HIGH (ref 0.0–0.2)

## 2021-10-14 LAB — GLUCOSE, CAPILLARY
Glucose-Capillary: 132 mg/dL — ABNORMAL HIGH (ref 70–99)
Glucose-Capillary: 247 mg/dL — ABNORMAL HIGH (ref 70–99)
Glucose-Capillary: 206 mg/dL — ABNORMAL HIGH (ref 70–99)
Glucose-Capillary: 211 mg/dL — ABNORMAL HIGH (ref 70–99)

## 2021-10-14 LAB — BASIC METABOLIC PANEL
Anion gap: 7 (ref 5–15)
BUN: 45 mg/dL — ABNORMAL HIGH (ref 8–23)
CO2: 22 mmol/L (ref 22–32)
Calcium: 8.9 mg/dL (ref 8.9–10.3)
Chloride: 114 mmol/L — ABNORMAL HIGH (ref 98–111)
Creatinine, Ser: 2.27 mg/dL — ABNORMAL HIGH (ref 0.61–1.24)
GFR, Estimated: 29 mL/min — ABNORMAL LOW (ref >60)
Glucose, Bld: 188 mg/dL — ABNORMAL HIGH (ref 70–99)
Potassium: 3.2 mmol/L — ABNORMAL LOW (ref 3.5–5.1)
Sodium: 143 mmol/L (ref 135–145)

## 2021-10-14 LAB — PROTIME-INR
INR: 1.6 — ABNORMAL HIGH (ref 0.8–1.2)
Prothrombin Time: 18.5 seconds — ABNORMAL HIGH (ref 11.4–15.2)

## 2021-10-14 MED ORDER — ORAL CARE MOUTH RINSE
15.0000 mL | Freq: Two times a day (BID) | OROMUCOSAL | Status: DC
  Administered 2021-10-15 – 2021-10-17 (×3): 15 mL via OROMUCOSAL

## 2021-10-14 MED ORDER — POTASSIUM CHLORIDE CRYS ER 20 MEQ PO TBCR
40.0000 meq | EXTENDED_RELEASE_TABLET | Freq: Two times a day (BID) | ORAL | Status: AC
  Administered 2021-10-14 (×2): 40 meq via ORAL
  Filled 2021-10-14 (×2): qty 2

## 2021-10-14 NOTE — Progress Notes (Signed)
Patient had a pause 4.40 seconds and decrease heart rate down to 26 . On call provider notified, ?

## 2021-10-14 NOTE — Progress Notes (Signed)
PT Cancellation Note ? ?Patient Details ?Name: Dylan [name] ?MRN: [id number] ?DOB: 09/09/1945 ? ? ?Cancelled Treatment:    Reason Eval/Treat Not Completed: Patient declined, no reason specified. Pt reports he took a long walk in the hallway yesterday and is not going to ambulate today. PT will attempt to return later today however the pt seems very reluctant to participate at this time. ? ? ?Zenaida Niece ?10/14/2021, 9:57 AM ?

## 2021-10-14 NOTE — Progress Notes (Signed)
Patient refused CPAP at this time. States he does not wear one at home and does not want to use one during hospitalization.  ?

## 2021-10-14 NOTE — Progress Notes (Addendum)
? ? ?HD#4 ?Subjective:  ?Overnight Events: No acute events overnight ? ?Sitting up in recliner this morning, states he is feeling well. Feels his abdomen is less distended. We discussed his diagnosis of cirrhosis and how he will continue to be admitted until he has improvement of his kidney function and further tests are performed. He does not remember overnight event where he had pauses in his heart rhythm. We discussed wearing CPAP and the advantages of that, he continues to refuse wanting to wear it saying he does not like it. He is interested in a new procedure he has heard of that would help with his sleep apnea. ? ?Objective:  ?Vital signs in last 24 hours: ?Vitals:  ? 10/14/21 0407 10/14/21 0414 10/14/21 0622 10/14/21 6979  ?BP: (!) 191/98  (!) 160/72 (!) 165/95  ?Pulse: 72 72 61 68  ?Resp: _0 ?Temp: (!) 97.5 ?F (36.4 ?C)   98.9 ?F (37.2 ?C)  ?TempSrc: Oral   Oral  ?SpO2: 95% 90%  100%  ?Weight:      ?Height:      ? ?Supplemental O2: Room Air ?SpO2: 100 % ?O2 Flow Rate (L/min): 3 L/min ? ? ?Physical Exam:  ?Constitutional: well appearing, no acute distress ?HENT: normocephalic atraumatic ?Eyes: conjunctiva non-erythematous.  ?Neck: supple. JVD to base of neck ?Cardiovascular: irregular rhythm, no murmurs, gallops, rubs ?Pulmonary/Chest: normal work of breathing on home O2, lungs clear to auscultation bilaterally ?Abdominal: soft, distended, non tender ?MSK: normal bulk and tone ?Neurological: alert & oriented to self, place, but not time or situation.  ?Skin: warm and dry. No telangiectasias. Non-palpable purpura on abdomen and left arm ?Psych: normal mood ? ?Filed Weights  ? 10/09/21 1403 10/10/21 1158 10/12/21 0627  ?Weight: 78 kg 70.3 kg 84.1 kg  ? ? ? ?Intake/Output Summary (Last 24 hours) at 10/14/2021 1238 ?Last data filed at 10/14/2021 0500 ?Gross per 24 hour  ?Intake 720 ml  ?Output 1700 ml  ?Net -980 ml  ? ? ?Net IO Since Admission: -2,035 mL [10/14/21 1238] ? ?Pertinent Labs: ? ?  Latest Ref  Rng & Units 10/14/2021  ?  2:43 AM 10/13/2021  ?  1:01 AM 10/12/2021  ? 12:57 AM  ?CBC  ?WBC 4.0 - 10.5 K/uL 11.1   12.2   11.0    ?Hemoglobin 13.0 - 17.0 g/dL 9.2   9.3   9.0    ?Hematocrit 39.0 - 52.0 % 31.1   31.5   29.9    ?Platelets 150 - 400 K/uL 84   91   96    ? ? ? ?  Latest Ref Rng & Units 10/14/2021  ?  9:48 AM 10/14/2021  ?  2:43 AM 10/13/2021  ?  1:01 AM  ?CMP  ?Glucose 70 - 99 mg/dL  188   241    ?BUN 8 - 23 mg/dL  45   49    ?Creatinine 0.61 - 1.24 mg/dL  2.27   2.63    ?Sodium 135 - 145 mmol/L  143   142    ?Potassium 3.5 - 5.1 mmol/L  3.2   4.2    ?Chloride 98 - 111 mmol/L  114   112    ?CO2 22 - 32 mmol/L  22   22    ?Calcium 8.9 - 10.3 mg/dL  8.9   9.2    ?Total Protein 6.5 - 8.1 g/dL 7.0    6.9    ?Total Bilirubin 0.3 - 1.2 mg/dL 1.3  1.0    ?Alkaline Phos 38 - 126 U/L 87    81    ?AST 15 - 41 U/L 21    20    ?ALT 0 - 44 U/L 19    18    ? ? ?Imaging: ?No results found. ? ?Assessment/Plan:  ? ?Principal Problem: ?  GI bleed ?Active Problems: ?  AKI (acute kidney injury) ([place]) ?  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  Gout ?  Coronary artery disease without angina pectoris ? ? ?Patient Summary: ?Dylan [name] is a 76 year old male with a history of colonic diverticula, CKD stage III, HTN, chronic anemia, chronic hypoxic respiratory failure on 3 L, gout, non-insulin dependent T2DM, HFrEF (EF 30-35% 1/23) and A-fib on Eliquis who presented with increasing bloody stool and anemia and admitted for symptomatic anemia.  ?  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 GI following and appreciate their assistance in his care. Paracentesis performed yesterday, SAAG score above 1.1. Protein of 3.6 g/dL suggestive of cardiac ascites. Will continue diuretic therapy of lasix and spiro. Cell count normal and PMH under 250, nonneutrocytic  bacterascites. Will discontinue ceftriaxone. Change diet to low salt. Cytology pending.  ?-GI follow, appreciate their recommendations ?-continue home lasix dosing, if urine output is not adequate, transition back to IV lasix ?-hold spiro per GI until Cr under 1.8 ?-cytology from ascites pending  ?-continue lactulose 20 g daily, goal 3 BMs per day. One large BM today thus far.  ? ?Acute on CKD stg IIIb ?Cr improving with diuretic therapy. Paracentesis labs suggestive ascites and hypervolemia is secondary to cardiac etiology. Hope he has continued improvement with lasix.  ?-continue lasix PO, if no urine output consider increasing dose or transition to IV ?-start sprio once cr under 1.8 ?-daily BMP ? ?Symptomatic Anemia ?Thought to be secondary to lower GI bleed, as no evidence of bleeding on EGD and known history of bleeding diverticulosis. Hgb stable. Eliquis restarted yesterday. No further signs of bleeding. ?-continue to monitor for bleeding ?-daily CBC ?-continue PPI ?-transfuse and hold eliquis if hgb >7 ? ?Systolic heart failure ?Hypertrophic cardiomyopathy ?Last echo EF 30 to 35%.  Known history of hypertrophic cardiomyopathy. Evaluated by cardiology 10/13/21. Will continue PO lasix with ascites labs consistent with cardiac etiology.  ? ?Persistent Atrial Fibrillation ?RBBB/LAFB ?Nocturnal Complete Heart Block ?History of significant conduction disease.  No change in EKG on admission and overnight. Discontinued carvedilol 4/8. Has not had significant episodes of tachycardia with his afib. Eliquis restarted today. Continues to have nocturnal episodes of bradycardia/asystole. Continues to deny wanting to wear his CPAP.  ?-continue telemetry ?-continue eliquis, restart carvedilol if begins to have persistent tachycardia.  ? ?Hypertension ?Holding losartan in setting of AKI. Continue home hydralazine 50 mg TID and lasix. Consider adding spiro once he has improvement of AKI ? ?Diet: low salt ?IVF: None,None ?VTE:  Eliquis ?Code: Full ?PT/OT recs: Pending,  ?Family Update: Spoke with his daughter Dylan [name] today ? ?Dispo: Anticipated discharge to home pending further workup of decompensated cirrhosis ? ?Vasili Arian Murley DO ?Internal Medicine Resident PGY-2 ?Pager [phone] ?Please contact the on call pager after 5 pm and on weekends at [phone].  ?

## 2021-10-14 NOTE — Progress Notes (Signed)
Pt has refused cpap at this time.  No device in room. Rt will cont to monitor. ?

## 2021-10-14 NOTE — Progress Notes (Signed)
Subjective: ?No complaints.  Feeling well. ? ?Objective: ?Vital signs in last 24 hours: ?Temp:  [97.5 ?F (36.4 ?C)-98.9 ?F (37.2 ?C)] 98.9 ?F (37.2 ?C) (04/09 5400) ?Pulse Rate:  [61-90] 68 (04/09 0823) ?Resp:  [17-19] 19 (04/09 8676) ?BP: (133-191)/(65-98) 165/95 (04/09 1950) ?SpO2:  [90 %-100 %] 100 % (04/09 0823) ?Last BM Date : 10/12/21 ? ?Intake/Output from previous day: ?04/08 0701 - 04/09 0700 ?In: 840 [P.O.:840] ?Out: 1700 [Urine:1700] ?Intake/Output this shift: ?No intake/output data recorded. ? ?General appearance: alert and no distress ?GI: soft, non-tender; bowel sounds normal; no masses,  no organomegaly ? ?Lab Results: ?Recent Labs  ?  10/12/21 ?9326 10/13/21 ?0101 10/14/21 ?0243  ?WBC 11.0* 12.2* 11.1*  ?HGB 9.0* 9.3* 9.2*  ?HCT 29.9* 31.5* 31.1*  ?PLT 96* 91* 84*  ? ?BMET ?Recent Labs  ?  10/12/21 ?1413 10/13/21 ?0101 10/14/21 ?7124  ?NA 141 142 143  ?K 3.8 4.2 3.2*  ?CL 112* 112* 114*  ?CO2 21* 22 22  ?GLUCOSE 204* 241* 188*  ?BUN 52* 49* 45*  ?CREATININE 2.55* 2.63* 2.27*  ?CALCIUM 9.2 9.2 8.9  ? ?LFT ?Recent Labs  ?  10/14/21 ?0948  ?PROT 7.0  ?ALBUMIN 3.7  ?AST 21  ?ALT 19  ?ALKPHOS 87  ?BILITOT 1.3*  ?BILIDIR 0.3*  ?IBILI 1.0*  ? ?PT/INR ?Recent Labs  ?  10/12/21 ?1413 10/14/21 ?0948  ?LABPROT 25.3* 18.5*  ?INR 2.3* 1.6*  ? ?Hepatitis Panel ?Recent Labs  ?  10/12/21 ?1413  ?HEPBSAG NON REACTIVE  ?HCVAB NON REACTIVE  ?HEPBIGM NON REACTIVE  ? ?C-Diff ?No results for input(s): CDIFFTOX in the last 72 hours. ?Fecal Lactopherrin ?No results for input(s): FECLLACTOFRN in the last 72 hours. ? ?Studies/Results: ?US RENAL ? ?Result Date: 10/13/2021 ?CLINICAL DATA:  AK I EXAM: RENAL / URINARY TRACT ULTRASOUND COMPLETE COMPARISON:  August 03, 2021 ultrasound FINDINGS: Right Kidney: Renal measurements: 10.4 x 5.3 x 6.1 cm = volume: 174 mL. Echogenicity within normal limits. No suspicious mass or hydronephrosis visualized. Several benign simple cysts are noted (for which no dedicated imaging follow-up is  recommended). Largest measures 1.4 x 1.5 x 1.5 cm. Left Kidney: Renal measurements: 10.7 x 5.0 x 5.6 cm = volume: 156 mL. Echogenicity within normal limits. No suspicious mass or hydronephrosis visualized. Several benign simple cysts are noted (for which no dedicated imaging follow-up is recommended). Largest measures 1.1 x 1.0 x 0.9 cm. Bladder: Appears normal for degree of bladder distention. Other: Ascites is incidentally noted IMPRESSION: No hydronephrosis. Electronically Signed   By: Valentino Saxon M.D.   On: 10/13/2021 12:35  ? ?US Paracentesis ? ?Result Date: 10/13/2021 ?INDICATION: Patient with history of congestive heart failure, cardiomyopathy, CKD with new onset ascites of unknown etiology. Request IR for diagnostic and therapeutic paracentesis with 5 L maximum. EXAM: ULTRASOUND GUIDED DIAGNOSTIC AND THERAPEUTIC PARACENTESIS MEDICATIONS: 8 mL% lidocaine COMPLICATIONS: None immediate. PROCEDURE: Informed written consent was obtained from the patient after a discussion of the risks, benefits and alternatives to treatment. A timeout was performed prior to the initiation of the procedure. Initial ultrasound scanning demonstrates a moderate amount of ascites within the right lower abdominal quadrant. The right lower abdomen was prepped and draped in the usual sterile fashion. 1% lidocaine was used for local anesthesia. Following this, a 19 gauge, 7-cm, Yueh catheter was introduced. An ultrasound image was saved for documentation purposes. The paracentesis was performed. The catheter was removed and a dressing was applied. The patient tolerated the procedure well without immediate post procedural complication.  FINDINGS: A total of approximately 2.7 L of clear, golden yellow fluid was removed. Samples were sent to the laboratory as requested by the clinical team. IMPRESSION: Successful ultrasound-guided paracentesis yielding 2.7 liters of peritoneal fluid. Read by Candiss Norse, PA-C Electronically Signed    By: Lucrezia Europe M.D.   On: 10/13/2021 13:21  ? ?US Abdomen Limited RUQ (LIVER/GB) ? ?Result Date: 10/12/2021 ?CLINICAL DATA:  Ascites.  Previous cholecystectomy. EXAM: ULTRASOUND ABDOMEN LIMITED RIGHT UPPER QUADRANT COMPARISON:  CT 10/09/2021 FINDINGS: Gallbladder: Previous cholecystectomy. Common bile duct: Diameter: 5.4 mm, normal. Liver: Increased echogenicity with mild lobulations of the surface of the liver consistent with cirrhosis. No focal liver lesion is seen. Ascites, diffusely distributed. Other: None. IMPRESSION: Cirrhosis of the liver. Ascites. No acute or focal finding otherwise. Electronically Signed   By: Nelson Chimes M.D.   On: 10/12/2021 16:55   ? ?Medications: Scheduled: ? allopurinol  100 mg Oral Daily  ? apixaban  5 mg Oral BID  ? aspirin EC  81 mg Oral Daily  ? atorvastatin  40 mg Oral QHS  ? ferrous sulfate  325 mg Oral Q breakfast  ? furosemide  40 mg Oral Daily  ? hydrALAZINE  50 mg Oral TID  ? insulin aspart  0-9 Units Subcutaneous TID WC  ? insulin aspart  2 Units Subcutaneous TID WC  ? isosorbide mononitrate  60 mg Oral Daily  ? lactulose  20 g Oral Daily  ? pantoprazole  40 mg Oral Daily  ? potassium chloride  40 mEq Oral BID WC  ? ?Continuous: ? 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 ?2) Arrhythmia overnight. ?3) Renal insufficiency. ? ? The patient is stable.  He is responding to the lasix.  His arrhythmia was evaluated and he is to avoid beta blockers. ? ?Plan: ?1) Continue with diuresis. ?2) Continue with pantoprazole. ? LOS: 4 days  ? ?Mavis Gravelle D ?10/14/2021, 2:08 PM  ?

## 2021-10-14 NOTE — Progress Notes (Signed)
IMTS PROGRESS NOTE: ? ?Paged by RN regarding 4.6 second pause on telemetry. Patient evaluated at bedside. He is resting comfortably in bed at this time. He denies any chest pain, palpitations, lightheadedness/dizziness, headache, difficulty breathing or weakness at this time. Mentation is at baseline.  ? ? ?Blood pressure (!) 164/94, pulse 70, temperature 98.6 ?F (37 ?C), temperature source Oral, resp. rate 19, height _0  (1.676 m), weight 84.1 kg, SpO2 97 %. ?Physical Exam  ?Constitutional: Chronically ill appearing elderly male, no acute distress  ?HENT: Normocephalic and atraumatic, EOMI, conjunctiva normal, moist mucous membranes ?Cardiovascular: Irregular rhythm, normal rate, S1 and S2 present, no murmurs, rubs, gallops.  Distal pulses intact.  ?Respiratory: No respiratory distress on room air. Lungs are clear to auscultation bilaterally. ?GI: soft; distended, non-tender to palpation ?Musculoskeletal: Normal bulk and tone. ?Neurological: Nonfocal ?Skin: Warm and dry.  ? ?Assessment/Plan: ?Patient with known history of conduction diease with prior outpatient monitors showing transient complete heart block at night. Patient noted to have similar event of bradycardia the previous evening. Concern for another episode of brief asystole again on telemetry. Telemetry was reviewed notable for atrial fibrillation with slow ventricular response with pauses consistent with complete heart block. Cardiology was consulted earlier today and recommended to avoid AV nodal blocking agents; however, no indication for pacing at this time. Currently, patient is asymptomatic. Patient encouraged to wear CPAP for his OSA; however, he is resistant at this time.  ? ?- Repeat EKG ?- Continue off of beta blockers ?- Continue to encourage CPAP use  ?- Continue cardiac monitoring ? ? ?Harvie Heck, MD ?Internal Medicine, PGY-3 ?10/14/21 2:40 AM ?Pager # [phone] ? ? ?

## 2021-10-15 LAB — CBC WITH DIFFERENTIAL/PLATELET
Abs Immature Granulocytes: 0.07 10*3/uL (ref 0.00–0.07)
Basophils Absolute: 0.1 10*3/uL (ref 0.0–0.1)
Basophils Relative: 1 %
Eosinophils Absolute: 0.2 10*3/uL (ref 0.0–0.5)
Eosinophils Relative: 2 %
HCT: 33.5 % — ABNORMAL LOW (ref 39.0–52.0)
Hemoglobin: 9.7 g/dL — ABNORMAL LOW (ref 13.0–17.0)
Immature Granulocytes: 1 %
Lymphocytes Relative: 10 %
Lymphs Abs: 1.3 10*3/uL (ref 0.7–4.0)
MCH: 26.5 pg (ref 26.0–34.0)
MCHC: 29 g/dL — ABNORMAL LOW (ref 30.0–36.0)
MCV: 91.5 fL (ref 80.0–100.0)
Monocytes Absolute: 1.9 10*3/uL — ABNORMAL HIGH (ref 0.1–1.0)
Monocytes Relative: 15 %
Neutro Abs: 9.4 10*3/uL — ABNORMAL HIGH (ref 1.7–7.7)
Neutrophils Relative %: 71 %
Platelets: 84 10*3/uL — ABNORMAL LOW (ref 150–400)
RBC: 3.66 MIL/uL — ABNORMAL LOW (ref 4.22–5.81)
RDW: 19.9 % — ABNORMAL HIGH (ref 11.5–15.5)
WBC: 13 10*3/uL — ABNORMAL HIGH (ref 4.0–10.5)
nRBC: 0.9 % — ABNORMAL HIGH (ref 0.0–0.2)

## 2021-10-15 LAB — BASIC METABOLIC PANEL
Anion gap: 7 (ref 5–15)
Anion gap: 6 (ref 5–15)
BUN: 46 mg/dL — ABNORMAL HIGH (ref 8–23)
BUN: 46 mg/dL — ABNORMAL HIGH (ref 8–23)
CO2: 24 mmol/L (ref 22–32)
CO2: 23 mmol/L (ref 22–32)
Calcium: 9.3 mg/dL (ref 8.9–10.3)
Calcium: 9 mg/dL (ref 8.9–10.3)
Chloride: 112 mmol/L — ABNORMAL HIGH (ref 98–111)
Chloride: 114 mmol/L — ABNORMAL HIGH (ref 98–111)
Creatinine, Ser: 2.22 mg/dL — ABNORMAL HIGH (ref 0.61–1.24)
Creatinine, Ser: 1.89 mg/dL — ABNORMAL HIGH (ref 0.61–1.24)
GFR, Estimated: 30 mL/min — ABNORMAL LOW (ref >60)
GFR, Estimated: 36 mL/min — ABNORMAL LOW (ref >60)
Glucose, Bld: 223 mg/dL — ABNORMAL HIGH (ref 70–99)
Glucose, Bld: 190 mg/dL — ABNORMAL HIGH (ref 70–99)
Potassium: 4.5 mmol/L (ref 3.5–5.1)
Potassium: 3.7 mmol/L (ref 3.5–5.1)
Sodium: 143 mmol/L (ref 135–145)
Sodium: 143 mmol/L (ref 135–145)

## 2021-10-15 LAB — PROTIME-INR
INR: 1.8 — ABNORMAL HIGH (ref 0.8–1.2)
Prothrombin Time: 21.1 seconds — ABNORMAL HIGH (ref 11.4–15.2)

## 2021-10-15 LAB — GLUCOSE, CAPILLARY
Glucose-Capillary: 222 mg/dL — ABNORMAL HIGH (ref 70–99)
Glucose-Capillary: 216 mg/dL — ABNORMAL HIGH (ref 70–99)
Glucose-Capillary: 235 mg/dL — ABNORMAL HIGH (ref 70–99)
Glucose-Capillary: 145 mg/dL — ABNORMAL HIGH (ref 70–99)

## 2021-10-15 MED ORDER — INSULIN ASPART 100 UNIT/ML IJ SOLN
4.0000 [IU] | Freq: Three times a day (TID) | INTRAMUSCULAR | Status: DC
  Administered 2021-10-15 – 2021-10-17 (×8): 4 [IU] via SUBCUTANEOUS

## 2021-10-15 MED ORDER — FUROSEMIDE 10 MG/ML IJ SOLN
40.0000 mg | Freq: Once | INTRAMUSCULAR | Status: DC
  Filled 2021-10-15: qty 4

## 2021-10-15 MED ORDER — INSULIN ASPART 100 UNIT/ML IJ SOLN: 3.0000 [IU] | Freq: Three times a day (TID) | INTRAMUSCULAR | Status: DC

## 2021-10-15 MED ORDER — LACTULOSE 10 GM/15ML PO SOLN
20.0000 g | Freq: Two times a day (BID) | ORAL | Status: DC
  Administered 2021-10-15: 20 g via ORAL
  Filled 2021-10-15: qty 30

## 2021-10-15 MED ORDER — POTASSIUM CHLORIDE 20 MEQ PO PACK
40.0000 meq | PACK | Freq: Once | ORAL | Status: AC
  Administered 2021-10-15: 40 meq via ORAL
  Filled 2021-10-15: qty 2

## 2021-10-15 MED ORDER — FUROSEMIDE 40 MG PO TABS
40.0000 mg | ORAL_TABLET | Freq: Every day | ORAL | Status: DC
  Administered 2021-10-15 – 2021-10-17 (×3): 40 mg via ORAL
  Filled 2021-10-15 (×3): qty 1

## 2021-10-15 NOTE — Progress Notes (Signed)
Arrived to patient's room to assess for PIV placement per consult. Patient up in chair. Instructed nurse to reconsult when patient back in bed and ready for IV team to come assess and place PIV. VU. Fran Lowes, RN VAST ?

## 2021-10-15 NOTE — Evaluation (Signed)
Physical Therapy Evaluation ? ?Patient Details ?Name: Dylan [name] ?MRN: [id number] ?DOB: 1946/07/05 ?Today's Date: 10/15/2021 ? ?History of Present Illness ? Pt is a 76 year old male who presented to the hospital 10/09/21 complaining of blood in stool for the past 3 weeks. PMH significant for colonic diverticula, CKD stage III, HTN, chronic anemia, chronic hypoxic respiratory failure on 3 L, gout, non-insulin dependent T2DM, HFrEF and A-fib on Eliquis. ?  ?Clinical Impression ? Pt admitted with above diagnosis. Pt currently with functional limitations due to the deficits listed below (see PT Problem List). At the time of PT eval pt was able to perform transfers and ambulation with mod I to supervision for safety and RW for support. Pt reports he has been managing fairly well at home with strong family support and anticipates he will have enough support to return home at d/c. He is interested in HHPT follow up and feel this is appropriate to maximize functional independence and safety. Acutely, pt will benefit from skilled PT to increase their independence and safety with mobility to allow discharge to the venue listed below.      ?   ? ?Recommendations for follow up therapy are one component of a multi-disciplinary discharge planning process, led by the attending physician.  Recommendations may be updated based on patient status, additional functional criteria and insurance authorization. ? ?Follow Up Recommendations Home health PT ? ?  ?Assistance Recommended at Discharge Intermittent Supervision/Assistance  ?Patient can return home with the following ? A little help with walking and/or transfers;Assist for transportation;Help with stairs or ramp for entrance ? ?  ?Equipment Recommendations Rolling walker (2 wheels)  ?Recommendations for Other Services ?    ?  ?Functional Status Assessment Patient has had a recent decline in their functional status and demonstrates the ability to make significant improvements in  function in a reasonable and predictable amount of time.  ? ?  ?Precautions / Restrictions Precautions ?Precautions: Fall ?Precaution Comments: on 3L/min supplemental O2 at baseline. ?Restrictions ?Weight Bearing Restrictions: No  ? ?  ? ?Mobility ? Bed Mobility ?Overal bed mobility: Modified Independent ?  ?  ?  ?  ?  ?  ?General bed mobility comments: Increased time ?  ? ?Transfers ?Overall transfer level: Modified independent ?Equipment used: Rolling walker (2 wheels) ?  ?  ?  ?  ?  ?  ?  ?General transfer comment: VC's for hand placement on seated surface for safety. ?  ? ?Ambulation/Gait ?Ambulation/Gait assistance: Supervision ?Gait Distance (Feet): 200 Feet ?Assistive device: Rolling walker (2 wheels) ?Gait Pattern/deviations: Step-through pattern, Decreased stride length, Trunk flexed ?Gait velocity: Decreased ?Gait velocity interpretation: <1.31 ft/sec, indicative of household ambulator ?  ?General Gait Details: VC's for improved posture, closer walker proximity, and forward gaze. Mild unsteadiness occasionally but no assist required to recover. ? ?Stairs ?  ?  ?  ?  ?  ? ?Wheelchair Mobility ?  ? ?Modified Rankin (Stroke Patients Only) ?  ? ?  ? ?Balance Overall balance assessment: Mild deficits observed, not formally tested ?  ?  ?  ?  ?  ?  ?  ?  ?  ?  ?  ?  ?  ?  ?  ?  ?  ?  ?   ? ? ? ?Pertinent Vitals/Pain Pain Assessment ?Pain Assessment: No/denies pain  ? ? ?Home Living Family/patient expects to be discharged to:: Private residence ?Living Arrangements: Alone ?Available Help at Discharge: Family;Available PRN/intermittently ?Type of Home: Apartment ?Home Access: Stairs  to enter ?Entrance Stairs-Rails: Right ?Entrance Stairs-Number of Steps: 8 (4+4) ?  ?Home Layout: One level ?Home Equipment: Shower seat;Standard Walker;Cane - quad ?Additional Comments: granddaughter helps with groceries and transportation to doctor  ?  ?Prior Function Prior Level of Function : Independent/Modified Independent ?  ?  ?   ?  ?  ?  ?Mobility Comments: using standard walker ?ADLs Comments: sits to shower, independent in self care, meal prep, light houskeeping ?  ? ? ?Hand Dominance  ? Dominant Hand: Right ? ?  ?Extremity/Trunk Assessment  ? Upper Extremity Assessment ?Upper Extremity Assessment: Defer to OT evaluation ?  ? ?Lower Extremity Assessment ?Lower Extremity Assessment: Generalized weakness ?  ? ?Cervical / Trunk Assessment ?Cervical / Trunk Assessment: Kyphotic;Other exceptions ?Cervical / Trunk Exceptions: Generally flexed trunk/truncal weakness.  ?Communication  ? Communication: HOH  ?Cognition Arousal/Alertness: Awake/alert ?Behavior During Therapy: [facility] for tasks assessed/performed ?Overall Cognitive Status: Within Functional Limits for tasks assessed ?  ?  ?  ?  ?  ?  ?  ?  ?  ?  ?  ?  ?  ?  ?  ?  ?  ?  ?  ? ?  ?General Comments   ? ?  ?Exercises    ? ?Assessment/Plan  ?  ?PT Assessment Patient needs continued PT services  ?PT Problem List Decreased strength;Decreased activity tolerance;Decreased mobility;Decreased balance;Decreased knowledge of use of DME;Decreased safety awareness;Decreased knowledge of precautions;Pain ? ?   ?  ?PT Treatment Interventions Gait training;DME instruction;Functional mobility training;Therapeutic activities;Therapeutic exercise;Neuromuscular re-education;Patient/family education   ? ?PT Goals (Current goals can be found in the Care Plan section)  ?Acute Rehab PT Goals ?Patient Stated Goal: Home at d/c ?PT Goal Formulation: With patient ?Time For Goal Achievement: 10/22/21 ?Potential to Achieve Goals: Good ? ?  ?Frequency Min 3X/week ?  ? ? ?Co-evaluation   ?  ?  ?  ?  ? ? ?  ?AM-PAC PT "6 Clicks" Mobility  ?Outcome Measure Help needed turning from your back to your side while in a flat bed without using bedrails?: None ?Help needed moving from lying on your back to sitting on the side of a flat bed without using bedrails?: None ?Help needed moving to and from a bed to a chair (including a  wheelchair)?: A Little ?Help needed standing up from a chair using your arms (e.g., wheelchair or bedside chair)?: None ?Help needed to walk in hospital room?: A Little ?Help needed climbing 3-5 steps with a railing? : A Little ?6 Click Score: 21 ? ?  ?End of Session Equipment Utilized During Treatment: Gait belt ?Activity Tolerance: Patient tolerated treatment well ?Patient left: in chair;with call bell/phone within reach;with chair alarm set ?Nurse Communication: Mobility status ?PT Visit Diagnosis: Unsteadiness on feet (R26.81);Difficulty in walking, not elsewhere classified (R26.2) ?  ? ?Time: 9417-4081 ?PT Time Calculation (min) (ACUTE ONLY): 26 min ? ? ?Charges:   PT Evaluation ?$PT Eval Moderate Complexity: 1 Mod ?PT Treatments ?$Gait Training: 8-22 mins ?  ?   ? ? ?Rolinda Roan, PT, DPT ?Acute Rehabilitation Services ?Secure Chat Preferred ?Office: [phone]  ? ?Thelma Comp ?10/15/2021, 3:27 PM ? ?

## 2021-10-15 NOTE — Progress Notes (Addendum)
Mobility Specialist Progress Note: ? ? 10/15/21 1341  ?Mobility  ?Activity Ambulated with assistance in room;Ambulated with assistance to bathroom  ?Level of Assistance Standby assist, set-up cues, supervision of patient - no hands on  ?Assistive Device None  ?Distance Ambulated (ft) 40 ft  ?Activity Response Tolerated well  ?$Mobility charge 1 Mobility  ? ?Pt received in chair. Pt said he's waiting for his IV to be placed so doesn't want to leave the room. Ambulated to BR and was able to have BM. Left in chair with call bell in reach and all needs met. Will follow-up for further ambulation after IV is placed.  ? ?Dylan [name] ?Mobility Specialist ?Primary Phone [phone] ? ?

## 2021-10-15 NOTE — Progress Notes (Signed)
Subjective: ?Patient seems to be doing much bettter. today.He has had 2 BM's without any blood or mucus in the stool. He denies having any abdominal pain and is tolerating his diet well.  ? ?Objective: ?Vital signs in last 24 hours: ?Temp:  [98.2 ?F (36.8 ?C)-98.5 ?F (36.9 ?C)] 98.2 ?F (36.8 ?C) (04/10 1555) ?Pulse Rate:  [44-80] 77 (04/10 1633) ?Resp:  [18] 18 (04/10 1555) ?BP: (128-180)/(67-96) 128/96 (04/10 1555) ?SpO2:  [89 %-98 %] 98 % (04/10 1633) ?Weight:  [80.1 kg] 80.1 kg (04/10 1633) ?Last BM Date : 10/15/21 (per pt) ? ?Intake/Output from previous day: ?04/09 0701 - 04/10 0700 ?In: 56 [P.O.:660] ?Out: 1000 [Urine:1000] ?Intake/Output this shift: ?Total I/O ?In: 240 [P.O.:240] ?Out: 500 [Urine:500] ? ?General appearance: alert, cooperative, appears stated age, no distress, and moderately obese ?Resp: clear to auscultation bilaterally ?Cardio: regular rate and rhythm, S1, S2 normal, no murmur, click, rub or gallop ?GI: soft, non-tender; bowel sounds normal; no masses,  no organomegaly ?Extremities: extremities normal, atraumatic, no cyanosis or edema ? ?Lab Results: ?Recent Labs  ?  10/13/21 ?0101 10/14/21 ?8413 10/15/21 ?2440  ?WBC 12.2* 11.1* 13.0*  ?HGB 9.3* 9.2* 9.7*  ?HCT 31.5* 31.1* 33.5*  ?PLT 91* 84* 84*  ? ?BMET ?Recent Labs  ?  10/14/21 ?0243 10/15/21 ?1027 10/15/21 ?1403  ?NA 143 143 143  ?K 3.2* 4.5 3.7  ?CL 114* 112* 114*  ?CO2 _0 ?GLUCOSE 188* 223* 190*  ?BUN 45* 46* 46*  ?CREATININE 2.27* 2.22* 1.89*  ?CALCIUM 8.9 9.3 9.0  ? ?LFT ?Recent Labs  ?  10/14/21 ?0948  ?PROT 7.0  ?ALBUMIN 3.7  ?AST 21  ?ALT 19  ?ALKPHOS 87  ?BILITOT 1.3*  ?BILIDIR 0.3*  ?IBILI 1.0*  ? ?PT/INR ?Recent Labs  ?  10/14/21 ?0948 10/15/21 ?0646  ?LABPROT 18.5* 21.1*  ?INR 1.6* 1.8*  ? ?Studies/Results: ?No results found. ? ?Medications: I have reviewed the patient's current medications. ?Prior to Admission:  ?Medications Prior to Admission  ?Medication Sig Dispense Refill Last Dose  ? acetaminophen (TYLENOL) 500 MG  tablet Take 500 mg by mouth every 6 (six) hours as needed for mild pain.   Past Week  ? allopurinol (ZYLOPRIM) 100 MG tablet Take 1 tablet (100 mg total) by mouth daily. 90 tablet 3 Past Week  ? apixaban (ELIQUIS) 5 MG TABS tablet Take 1 tablet (5 mg total) by mouth 2 (two) times daily. 180 tablet 3 10/08/2021 at 1000  ? aspirin EC 81 MG tablet Take 81 mg by mouth daily. Swallow whole.   Past Week  ? atorvastatin (LIPITOR) 40 MG tablet Take 1 tablet (40 mg total) by mouth daily. 90 tablet 3 Past Week  ? carvedilol (COREG) 25 MG tablet Take 1 tablet (25 mg total) by mouth 2 (two) times daily with a meal. 60 tablet 11 10/08/2021 at 1000  ? furosemide (LASIX) 40 MG tablet Take 1 tablet (40 mg total) by mouth daily. 90 tablet 3 Past Week  ? hydrALAZINE (APRESOLINE) 50 MG tablet Take 1 tablet (50 mg total) by mouth 3 (three) times daily. 90 tablet 5 Past Week  ? isosorbide mononitrate (IMDUR) 60 MG 24 hr tablet Take 1 tablet (60 mg total) by mouth daily. 90 tablet 3 Past Week  ? latanoprost (XALATAN) 0.005 % ophthalmic solution Place 1 drop into both eyes at bedtime.   Past Week  ? losartan (COZAAR) 100 MG tablet Take 1 tablet (100 mg total) by mouth daily. 90 tablet 1 Past Week  ?  metFORMIN (GLUCOPHAGE) 850 MG tablet Take 850 mg by mouth 2 (two) times daily.   Past Week  ? FREESTYLE LITE test strip Use to check blood sugar 3 times daily, DIAG CODE E11.29, INSULIN DEPENDENT 300 each 1   ? spironolactone (ALDACTONE) 25 MG tablet Take 1 tablet (25 mg total) by mouth daily. (Patient not taking: Reported on 10/10/2021) 30 tablet 2 Not Taking  ? ?Scheduled: ? allopurinol  100 mg Oral Daily  ? apixaban  5 mg Oral BID  ? aspirin EC  81 mg Oral Daily  ? atorvastatin  40 mg Oral QHS  ? ferrous sulfate  325 mg Oral Q breakfast  ? furosemide  40 mg Oral Daily  ? hydrALAZINE  50 mg Oral TID  ? insulin aspart  0-9 Units Subcutaneous TID WC  ? insulin aspart  4 Units Subcutaneous TID WC  ? isosorbide mononitrate  60 mg Oral Daily  ?  lactulose  20 g Oral BID  ? mouth rinse  15 mL Mouth Rinse BID  ? pantoprazole  40 mg Oral Daily  ? ?Continuous: ?FTN:[id number] **OR** acetaminophen ? ?Assessment/Plan: ?1) Cirrhosis ?NASH-doing much better after being diuresed. ?2) Iron deiciency anemia s/p GI bleed thought to be due to diverticulosis. ?3) CKD. ? LOS: 5 days  ? ?Chasten Blaze ?10/15/2021, 4:55 PM ? ? ?

## 2021-10-15 NOTE — Plan of Care (Signed)

## 2021-10-15 NOTE — TOC Initial Note (Signed)
Transition of Care (TOC) - Initial/Assessment Note  ? ? ?Patient Details  ?Name: Dylan [name] ?MRN: [id number] ?Date of Birth: 01/18/1946 ? ?Transition of Care (TOC) CM/SW Contact:    ?Marilu Favre, RN ?Phone Number: ?10/15/2021, 4:37 PM ? ?Clinical Narrative:                 ?Spoke to patient at bedside. Patient from home alone but has family close by.  ? ?Patient already has home oxygen and walker. Discussed HHPT, no preference.  ? ?Judson Roch with [facility] reviewing referral, will need orders and face to face  ? ?Expected Discharge Plan: [place] ?Barriers to Discharge: Continued Medical Work up ? ? ?Patient Goals and CMS Choice ?Patient states their goals for this hospitalization and ongoing recovery are:: to return to home ?  ?Choice offered to / list presented to : Patient ? ?Expected Discharge Plan and Services ?Expected Discharge Plan: [place] ?  ?Discharge Planning Services: CM Consult ?Post Acute Care Choice: Home Health ?Living arrangements for the past 2 months: Apartment ?                ?DME Arranged: N/A ?  ?  ?  ?  ?HH Arranged: PT ?[place] Agency: [place] ?Date HH Agency Contacted: 10/15/21 ?Time [place]: 7048 ?Representative spoke with at [place]: Sarah reviewing referral ? ?Prior Living Arrangements/Services ?Living arrangements for the past 2 months: Apartment ?Lives with:: Self ?Patient language and need for interpreter reviewed:: Yes ?Do you feel safe going back to the place where you live?: Yes      ?Need for Family Participation in Patient Care: Yes (Comment) ?Care giver support system in place?: Yes (comment) ?Current home services: DME ?Criminal Activity/Legal Involvement Pertinent to Current Situation/Hospitalization: No - Comment as needed ? ?Activities of Daily Living ?Home Assistive Devices/Equipment: None ?ADL Screening (condition at time of admission) ?Patient's cognitive ability adequate to safely complete daily  activities?: Yes ?Is the patient deaf or have difficulty hearing?: No ?Does the patient have difficulty seeing, even when wearing glasses/contacts?: No ?Does the patient have difficulty concentrating, remembering, or making decisions?: No ?Patient able to express need for assistance with ADLs?: Yes ?Does the patient have difficulty dressing or bathing?: No ?Independently performs ADLs?: Yes (appropriate for developmental age) ?Does the patient have difficulty walking or climbing stairs?: No ?Weakness of Legs: None ? ?Permission Sought/Granted ?  ?Permission granted to share information with : No ?   ?   ?   ?   ? ?Emotional Assessment ?Appearance:: Appears stated age ?Attitude/Demeanor/Rapport: Engaged ?Affect (typically observed): Accepting ?Orientation: : Oriented to Self, Oriented to Place, Oriented to  Time, Oriented to Situation ?Alcohol / Substance Use: Not Applicable ?Psych Involvement: No (comment) ? ?Admission diagnosis:  Rectal bleeding [K62.5] ?GI bleed [K92.2] ?AKI (acute kidney injury) ([place]) [N17.9] ?Symptomatic anemia [D64.9] ?Patient Active Problem List  ? Diagnosis Date Noted  ? GI bleed 10/09/2021  ? Hypertrophic obstructive cardiomyopathy ([place]) 08/05/2021  ? 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 Subareolar gynecomastia in male, left 04/09/2021  ? Secondary hyperparathyroidism of renal origin ([place]) 01/25/2021  ? RBBB 11/23/2020  ? History of BPH 09/14/2020  ? Healthcare maintenance 09/13/2020  ? Urge urinary incontinence 06/29/2020  ? 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 Aortic atherosclerosis ([place]) 03/28/2020  ? CKD (chronic kidney disease) stage 3, GFR 30-59 ml/min ([name]) 03/09/2020  ? Coronary artery disease without angina pectoris  04/04/2018  ? 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 Atherosclerosis 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 ([place]) 12/28/2014  ? Gout 10/19/2014  ? 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 Hypertension associated with chronic kidney disease due to type 2 diabetes mellitus ([place]) 10/09/2014  ? AKI (acute kidney injury) ([place])   ? OSA (obstructive sleep apnea) 10/08/2014  ? Former tobacco use 10/08/2014  ? Personal history of colonic polyps 03/28/2014  ? DM (diabetes mellitus), type 2 with renal complications ([place]) 97/94/8016  ? ?PCP:  Angelica Pou, MD ?Pharmacy:   ?[place], [name] - [address] ?[address] ?[name] [address] ?Phone: [phone] Fax: [phone] ? ?[facility] ?[address] ?[name] [address] ?Phone: [phone] Fax: [phone] ? ? ? ? ?Social Determinants of Health (SDOH) Interventions ?  ? ?Readmission Risk Interventions ?   ? View : No data to display.  ?  ?  ?  ? ? ? ?

## 2021-10-15 NOTE — Progress Notes (Addendum)
? ? ?HD#5 ?Subjective:  ?Overnight Events: PVCs on telemetry overnight. ? ?Patient assessed at bedside this AM.  He states that he feels okay today and breathing feels better today.  He is tired of being in the hospital being asked questions.  He would like to speak with his primary doctor, but is not able to say who that is.  He does not want to answer orientation questions or complete physical exam.  No concerns at this time. ? ?Objective:  ?Vital signs in last 24 hours: ?Vitals:  ? 10/14/21 0823 10/14/21 1537 10/14/21 2138 10/15/21 0426  ?BP: (!) 165/95 (!) 152/83 (!) 156/85 (!) 180/95  ?Pulse: 68 83 80 63  ?Resp: _0 ?Temp: 98.9 ?F (37.2 ?C) 98 ?F (36.7 ?C) 98.2 ?F (36.8 ?C) 98.3 ?F (36.8 ?C)  ?TempSrc: Oral Oral Oral Oral  ?SpO2: 100% 100% 98% 94%  ?Weight:      ?Height:      ? ?Supplemental O2: Room Air ?SpO2: 94 % ?O2 Flow Rate (L/min): 3 L/min ? ? ?Physical Exam:  ?Physical Exam ?HENT:  ?   Mouth/Throat:  ?   Mouth: Mucous membranes are dry.  ?Cardiovascular:  ?   Rate and Rhythm: Normal rate and regular rhythm.  ?Pulmonary:  ?   Effort: Pulmonary effort is normal.  ?   Breath sounds: Normal breath sounds.  ?Abdominal:  ?   General: Bowel sounds are normal. There is distension.  ?   Palpations: Abdomen is soft.  ?   Tenderness: There is no abdominal tenderness.  ?Musculoskeletal:     ?   General: Normal range of motion.  ?Skin: ?   General: Skin is warm and dry.  ?Neurological:  ?   Mental Status: He is alert.  ?   Comments: Unable to assess orientation as patient does not want to answer questions  ?  ? ?Filed Weights  ? 10/09/21 1403 10/10/21 1158 10/12/21 0627  ?Weight: 78 kg 70.3 kg 84.1 kg  ? ? ? ?Intake/Output Summary (Last 24 hours) at 10/15/2021 0629 ?Last data filed at 10/15/2021 0434 ?Gross per 24 hour  ?Intake 660 ml  ?Output 1000 ml  ?Net -340 ml  ? ? ?Net IO Since Admission: -2,375 mL [10/15/21 0629] ? ?Pertinent Labs: ? ?  Latest Ref Rng & Units 10/15/2021  ?  1:37 AM 10/14/2021  ?  2:43 AM  10/13/2021  ?  1:01 AM  ?CBC  ?WBC 4.0 - 10.5 K/uL 13.0   11.1   12.2    ?Hemoglobin 13.0 - 17.0 g/dL 9.7   9.2   9.3    ?Hematocrit 39.0 - 52.0 % 33.5   31.1   31.5    ?Platelets 150 - 400 K/uL 84   84   91    ? ? ? ?  Latest Ref Rng & Units 10/15/2021  ?  1:37 AM 10/14/2021  ?  9:48 AM 10/14/2021  ?  2:43 AM  ?CMP  ?Glucose 70 - 99 mg/dL 223    188    ?BUN 8 - 23 mg/dL 46    45    ?Creatinine 0.61 - 1.24 mg/dL 2.22    2.27    ?Sodium 135 - 145 mmol/L 143    143    ?Potassium 3.5 - 5.1 mmol/L 4.5    3.2    ?Chloride 98 - 111 mmol/L 112    114    ?CO2 22 - 32 mmol/L 24    22    ?  Calcium 8.9 - 10.3 mg/dL 9.3    8.9    ?Total Protein 6.5 - 8.1 g/dL  7.0     ?Total Bilirubin 0.3 - 1.2 mg/dL  1.3     ?Alkaline Phos 38 - 126 U/L  87     ?AST 15 - 41 U/L  21     ?ALT 0 - 44 U/L  19     ? ? ?Imaging: ?No results found. ? ?Assessment/Plan:  ? ?Principal Problem: ?  GI bleed ?Active Problems: ?  AKI (acute kidney injury) ([place]) ?  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  Gout ?  Coronary artery disease without angina pectoris ? ? ?Patient Summary: ?Dylan [name] is a 76 year old male with a history of colonic diverticula, CKD stage III, HTN, chronic anemia, chronic hypoxic respiratory failure on 3 L, gout, non-insulin dependent T2DM, HFrEF (EF 30-35% 1/23) and A-fib on Eliquis who presented with increasing bloody stool and anemia and admitted for symptomatic anemia. He was found to have worsening renal function and decompensated cirrhosis.  ? ?Acute on CKD stg IIIb ?Cr improving with diuretic therapy. Paracentesis labs suggestive ascites and hypervolemia is secondary to cardiac etiology. Will try IV lasix 40 mg and repeat BMP this PM. ? ?-IV Lasix 40 mg ?-repeat BMP this PM ?-start spiro once cr under 1.8 ?-daily BMP ? 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 Protein of 3.6 g/dL  suggestive of cardiac ascites. Will continue diuretic therapy of lasix.  Patient seems confused, but he is also frustrated about being in the hospital.  He is unwilling to answer orientation questions or follow instructions to evaluate for asterixis on physical exam.  Will increase lactulose dosing to twice daily. ? ?-GI follow, appreciate their recommendations ?-continue home lasix dosing, urine output 1L ?-hold spiro per GI until Cr under 1.8 ?-cytology from ascites pending  ?-continue lactulose 20 g BID, goal 3 BMs per day. One large BM today thus far.  ? ?Symptomatic Anemia ?Thought to be secondary to lower GI bleed, as no evidence of bleeding on EGD and known history of bleeding diverticulosis. Hgb stable. Eliquis restarted with no further signs of bleeding. ? ?-continue to monitor for bleeding ?-daily CBC ?-continue PPI ?-transfuse and hold eliquis if hgb >7 ? ?Systolic heart failure ?Hypertrophic cardiomyopathy ?Last echo EF 30 to 35%.  Known history of hypertrophic cardiomyopathy. Evaluated by cardiology 10/13/21. Will continue PO lasix with ascites labs consistent with cardiac etiology.  ? ?Persistent Atrial Fibrillation ?RBBB/LAFB ?Nocturnal Complete Heart Block ?History of significant conduction disease.  No change in EKG on admission and overnight. Discontinued carvedilol 4/8. Has not had significant episodes of tachycardia with his afib. Eliquis restarted. Frequent PVCs overnight. ? ?-continue telemetry ?-continue eliquis, restart carvedilol if begins to have persistent tachycardia.  ? ?Hypertension ?Holding losartan in setting of AKI. Continue home hydralazine 50 mg TID and lasix. Consider adding spiro once he has improvement of AKI ? ?Hyperglycemia ?BG in 200s. He received 11 units novolog yesterday. ? ?-Novolog 4 units ?-SSI ? ?Diet: low salt ?IVF: None,None ?VTE: Eliquis ?Code: Full ?PT/OT recs: Pending ?Family Update: Will call to update daughter, Kennyth Lose ? ?Dispo: Anticipated discharge to home  pending improvement in renal function. ? ?Daleen Bo. Glenice Ciccone, D.O.  ?Internal Medicine Resident, PGY-1 ?[facility]  ?Pager: [phone] ?2:43 PM, 10/15/2021  ? ?**Please contact the on call pager after  5 pm and on weekends at [phone].** ? ?  ?

## 2021-10-15 NOTE — Progress Notes (Signed)
Patient refused CPAP for tonight. RT will monitor as needed. ?

## 2021-10-16 DIAGNOSIS — K5791 Diverticulosis of intestine, part unspecified, without perforation or abscess with bleeding: Secondary | ICD-10-CM

## 2021-10-16 LAB — BASIC METABOLIC PANEL
Anion gap: 7 (ref 5–15)
BUN: 51 mg/dL — ABNORMAL HIGH (ref 8–23)
CO2: 22 mmol/L (ref 22–32)
Calcium: 9 mg/dL (ref 8.9–10.3)
Chloride: 114 mmol/L — ABNORMAL HIGH (ref 98–111)
Creatinine, Ser: 1.93 mg/dL — ABNORMAL HIGH (ref 0.61–1.24)
GFR, Estimated: 35 mL/min — ABNORMAL LOW (ref >60)
Glucose, Bld: 200 mg/dL — ABNORMAL HIGH (ref 70–99)
Potassium: 4.1 mmol/L (ref 3.5–5.1)
Sodium: 143 mmol/L (ref 135–145)

## 2021-10-16 LAB — CBC
HCT: 33.2 % — ABNORMAL LOW (ref 39.0–52.0)
Hemoglobin: 9.8 g/dL — ABNORMAL LOW (ref 13.0–17.0)
MCH: 27.6 pg (ref 26.0–34.0)
MCHC: 29.5 g/dL — ABNORMAL LOW (ref 30.0–36.0)
MCV: 93.5 fL (ref 80.0–100.0)
Platelets: 80 10*3/uL — ABNORMAL LOW (ref 150–400)
RBC: 3.55 MIL/uL — ABNORMAL LOW (ref 4.22–5.81)
RDW: 21.4 % — ABNORMAL HIGH (ref 11.5–15.5)
WBC: 13.5 10*3/uL — ABNORMAL HIGH (ref 4.0–10.5)
nRBC: 1 % — ABNORMAL HIGH (ref 0.0–0.2)

## 2021-10-16 LAB — MAGNESIUM: Magnesium: 2.1 mg/dL (ref 1.7–2.4)

## 2021-10-16 LAB — GLUCOSE, CAPILLARY
Glucose-Capillary: 202 mg/dL — ABNORMAL HIGH (ref 70–99)
Glucose-Capillary: 173 mg/dL — ABNORMAL HIGH (ref 70–99)
Glucose-Capillary: 58 mg/dL — ABNORMAL LOW (ref 70–99)
Glucose-Capillary: 81 mg/dL (ref 70–99)
Glucose-Capillary: 86 mg/dL (ref 70–99)
Glucose-Capillary: 191 mg/dL — ABNORMAL HIGH (ref 70–99)

## 2021-10-16 LAB — CYTOLOGY - NON PAP

## 2021-10-16 MED ORDER — QUETIAPINE FUMARATE 50 MG PO TABS
25.0000 mg | ORAL_TABLET | Freq: Once | ORAL | Status: AC
  Administered 2021-10-16: 25 mg via ORAL
  Filled 2021-10-16: qty 1

## 2021-10-16 MED ORDER — LACTULOSE 10 GM/15ML PO SOLN
20.0000 g | Freq: Three times a day (TID) | ORAL | Status: DC
  Administered 2021-10-16 – 2021-10-17 (×5): 20 g via ORAL
  Filled 2021-10-16 (×5): qty 30

## 2021-10-16 MED ORDER — METFORMIN HCL 850 MG PO TABS
850.0000 mg | ORAL_TABLET | Freq: Two times a day (BID) | ORAL | Status: DC
Start: 2021-10-16 — End: 2021-10-17
  Administered 2021-10-17 (×2): 850 mg via ORAL
  Filled 2021-10-16 (×7): qty 1

## 2021-10-16 MED ORDER — LATANOPROST 0.005 % OP SOLN
1.0000 [drp] | Freq: Every day | OPHTHALMIC | Status: DC
  Administered 2021-10-16: 1 [drp] via OPHTHALMIC
  Filled 2021-10-16: qty 2.5

## 2021-10-16 NOTE — Progress Notes (Signed)
Pt refused CPAP tonight. States he will call if he needs it. ?

## 2021-10-16 NOTE — Plan of Care (Signed)

## 2021-10-16 NOTE — Progress Notes (Signed)
BP 194/103. No PRNs available. Christiana Fuchs, DO paged. No new orders at this time.  ?

## 2021-10-16 NOTE — TOC Progression Note (Addendum)
Transition of Care (TOC) - Progression Note  ? ? ?Patient Details  ?Name: Dylan [name] ?MRN: [id number] ?Date of Birth: 01-29-1946 ? ?Transition of Care (TOC) CM/SW Contact  ?Marilu Favre, RN ?Phone Number: ?10/16/2021, 10:31 AM ? ?Clinical Narrative:    ? ?Sarah with [facility] accepted referral for HHPT and asking for orders and face to face also for [facility] for disease management. Secure chatted Dr Howie Ill for signature  orders and face to face .  ? ?Received a message from Dr Howie Ill to call patient's daughter Effie Janoski [phone] . NCM called Junie Bame. Ms Carll asking  about personal care workers to stay with her dad once discharged . Confirmed with Ms Seckman her dad does not have Medicaid . NCM explained personal care workers are private pay , family would need to arrange and pay out of pocket. NCM gave a few names of agencies and offered to google more , daughter stated she can do that.  ? ?Family has private paid someone in the past to stay with hm , but that person took advantage of him   ? ?NCM asked if any family or friends can assist [name] stated her and her sister are not local . Asked if he can stay with her at discharge. [name] stated she cannot force him to.  ? ? ?NCM explained [facility] will provide HHRN , OT and PT but they will not be there daily or for long periods of time.  ? ? ?[name] asked when patient will be discharged. NCM explained that is up to MD, however once patient is medically ready for discharge insurance will not pay for patient to remain in the hospital.  ? ?Loretta asked about SNF. NCM explained that PT recommended HHPT , not SNF . Insurance would not cover short term rehab at SNF with HHPT recommendation.  ? ?Patient wants to return home.  ? ?Dr Howie Ill updated. ? ?Above discussed at [place] with [facility] , there are no other resources to offer.  ?Expected Discharge Plan: [place] ?Barriers to Discharge: Continued Medical Work  up ? ?Expected Discharge Plan and Services ?Expected Discharge Plan: [place] ?  ?Discharge Planning Services: CM Consult ?Post Acute Care Choice: Home Health ?Living arrangements for the past 2 months: Apartment ?                ?DME Arranged: N/A ?  ?  ?  ?  ?HH Arranged: PT ?[place] Agency: [place] ?Date HH Agency Contacted: 10/15/21 ?Time [place]: 4970 ?Representative spoke with at [place]: Sarah reviewing referral ? ? ?Social Determinants of Health (SDOH) Interventions ?  ? ?Readmission Risk Interventions ?   ? View : No data to display.  ?  ?  ?  ? ? ?

## 2021-10-16 NOTE — Progress Notes (Signed)
? ? ?HD#6 ?Subjective:  ?Overnight Events: Agitated overnight.  He was given Seroquel 25 mg. ? ?Patient assessed at bedside this AM.  He states that he feels okay this morning.  His breathing feels about the same.  He is looking forward to going home.  He is oriented to person and place, but states that it is 2011.  No concerns at this time. ? ?Objective:  ?Vital signs in last 24 hours: ?Vitals:  ? 10/16/21 0400 10/16/21 0914 10/16/21 0947 10/16/21 1155  ?BP: (!) 188/97 (!) 194/103  (!) 160/78  ?Pulse: 83 79    ?Resp:  18    ?Temp:  98 ?F (36.7 ?C)    ?TempSrc:      ?SpO2:  98%    ?Weight:   76.3 kg   ?Height:      ? ?Supplemental O2: Room Air ?SpO2: 98 % ?O2 Flow Rate (L/min): 3 L/min ? ? ?Physical Exam:  ?Physical Exam ?HENT:  ?   Mouth/Throat:  ?   Mouth: Mucous membranes are dry.  ?Cardiovascular:  ?   Rate and Rhythm: Normal rate and regular rhythm.  ?Pulmonary:  ?   Effort: Pulmonary effort is normal.  ?   Breath sounds: Normal breath sounds.  ?Abdominal:  ?   General: Bowel sounds are normal. There is distension.  ?   Palpations: Abdomen is soft.  ?   Tenderness: There is no abdominal tenderness.  ?Musculoskeletal:     ?   General: Normal range of motion.  ?Skin: ?   General: Skin is warm and dry.  ?Neurological:  ?   Mental Status: He is alert.  ?   Comments: Oriented to person, place, but states it is 2011 ?Asterixis present bilaterally  ?  ? ?Filed Weights  ? 10/12/21 7290 10/15/21 1633 10/16/21 0947  ?Weight: 84.1 kg 80.1 kg 76.3 kg  ? ? ? ?Intake/Output Summary (Last 24 hours) at 10/16/2021 1259 ?Last data filed at 10/16/2021 0900 ?Gross per 24 hour  ?Intake 1360 ml  ?Output 500 ml  ?Net 860 ml  ? ? ?Net IO Since Admission: -1,275 mL [10/16/21 1259] ? ?Pertinent Labs: ? ?  Latest Ref Rng & Units 10/16/2021  ? 12:55 AM 10/15/2021  ?  1:37 AM 10/14/2021  ?  2:43 AM  ?CBC  ?WBC 4.0 - 10.5 K/uL 13.5   13.0   11.1    ?Hemoglobin 13.0 - 17.0 g/dL 9.8   9.7   9.2    ?Hematocrit 39.0 - 52.0 % 33.2   33.5   31.1     ?Platelets 150 - 400 K/uL 80   84   84    ? ? ? ?  Latest Ref Rng & Units 10/16/2021  ? 12:55 AM 10/15/2021  ?  2:03 PM 10/15/2021  ?  1:37 AM  ?CMP  ?Glucose 70 - 99 mg/dL 200   190   223    ?BUN 8 - 23 mg/dL 51   46   46    ?Creatinine 0.61 - 1.24 mg/dL 1.93   1.89   2.22    ?Sodium 135 - 145 mmol/L 143   143   143    ?Potassium 3.5 - 5.1 mmol/L 4.1   3.7   4.5    ?Chloride 98 - 111 mmol/L 114   114   112    ?CO2 22 - 32 mmol/L _0 ?Calcium 8.9 - 10.3 mg/dL 9.0  9.0   9.3    ? ? ?Imaging: ?No results found. ? ?Assessment/Plan:  ? ?Principal Problem: ?  GI bleed ?Active Problems: ?  AKI (acute kidney injury) ([place]) ?  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  Gout ?  Coronary artery disease without angina pectoris ? ? ?Patient Summary: ?Dylan [name] is a 76 year old male with a history of colonic diverticula, CKD stage III, HTN, chronic anemia, chronic hypoxic respiratory failure on 3 L, gout, non-insulin dependent T2DM, HFrEF (EF 30-35% 1/23) and A-fib on Eliquis who presented with increasing bloody stool and anemia and admitted for symptomatic anemia. He was found to have worsening renal function and decompensated cirrhosis.  Renal function is improving.  Patient is stable for discharge.  Will need assistance at home and working with family on how to safely discharge him. ? 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  He had a period of agitation overnight.  He appears oriented to self, place, but not time.  Asterixis present on exam.  Spoke with patient's daughter, [name].  She states that patient has had periods of fogginess recently.  He currently lives at home alone.  No one is able to stay with him during the day or at night.  Family has been working on finding a caregiver, they have noticed he has not been able to drive  himself to the doctor recently.  Discussed this with case management.  They reached out to family about situation.  Unfortunately no additional resources are available for family.  With signs concerning for hepatic encephalopathy will increase lactulose dosing. ? ?-GI signed off, patient will follow with them outpatient ?-continue home lasix dosing ?-hold spiro per GI until Cr under 1.8 ?-cytology from ascites pending  ?-continue lactulose 20 g TID, goal 3 BMs per day ? ?Acute on CKD stg IIIb ?Creatinine improving from 2.22-1.93 this morning.  Baseline creatinine is around 1.4-1.5.  Continue patient on p.o. diuretic home medication.  ? ?-Lasix 40 mg p.o. ?-trend BMP ?-start spiro once cr under 1.8 ? ?Iron deficiency Anemia ?Symptomatic anemia- resolved ?Thought to be secondary to lower GI bleed, as no evidence of bleeding on EGD and known history of bleeding diverticulosis. Hgb stable. Eliquis restarted with no further signs of bleeding.  Patient was given 1 transfusion of iron started on iron supplementation. ? ?-continue to monitor for bleeding ?-daily CBC ?-continue PPI ?-transfuse and hold eliquis if hgb >7 ?-Continue ferrous sulfate 325 mg daily ? ?Systolic heart failure ?Hypertrophic cardiomyopathy ?Last echo EF 30 to 35%.  Known history of hypertrophic cardiomyopathy. Evaluated by cardiology 10/13/21. Will continue PO lasix with ascites labs consistent with cardiac etiology.  ? ?Persistent Atrial Fibrillation ?RBBB/LAFB ?Nocturnal Complete Heart Block ?History of significant conduction disease.  No change in EKG on admission and overnight. Discontinued carvedilol 4/8. Has not had significant episodes of tachycardia with his afib. Eliquis restarted.  ? ?-continue eliquis, restart carvedilol if begins to have persistent tachycardia.  ? ?Hypertension ?Holding losartan in setting of AKI. Continue home hydralazine 50 mg TID and lasix. Consider adding spiro once he has improvement of AKI.   ? ?Hyperglycemia ?BG in  low 200s. ? ?-restart home medication metformin 850 mg BID ?-Novolog 4 units ?-SSI ? ?Diet: low salt ?IVF: None,None ?VTE: Eliquis ?Code: Full ?PT/OT recs: Pending ?Family Update: Called and spoke with [place] and [place]. ? ?Dispo: Anticipated discharge  to home tomorrow ? ?Keisean Skowron M. Raif Chachere, D.O.  ?Internal Medicine Resident, PGY-1 ?[facility]  ?Pager: [phone] ?12:59 PM, 10/16/2021  ? ?**Please contact the on call pager after 5 pm and on weekends at [phone].** ? ?  ?

## 2021-10-16 NOTE — Progress Notes (Signed)
Hypoglycemic Event ? ?CBG: 58 ? ?Treatment: 4 oz juice/soda ? ?Symptoms: None ? ?Follow-up CBG Time: 1651 CBG Result: 86 ? ?Possible Reasons for Event: Unknown ? ?Comments/MD notified: Christiana Fuchs, DO paged ? ? ? ?Racheal Patches, RN ? ?

## 2021-10-17 DIAGNOSIS — K625 Hemorrhage of anus and rectum: Secondary | ICD-10-CM

## 2021-10-17 LAB — GLUCOSE, CAPILLARY
Glucose-Capillary: 171 mg/dL — ABNORMAL HIGH (ref 70–99)
Glucose-Capillary: 204 mg/dL — ABNORMAL HIGH (ref 70–99)
Glucose-Capillary: 110 mg/dL — ABNORMAL HIGH (ref 70–99)

## 2021-10-17 LAB — BASIC METABOLIC PANEL
Anion gap: 9 (ref 5–15)
BUN: 39 mg/dL — ABNORMAL HIGH (ref 8–23)
CO2: 23 mmol/L (ref 22–32)
Calcium: 9 mg/dL (ref 8.9–10.3)
Chloride: 110 mmol/L (ref 98–111)
Creatinine, Ser: 1.66 mg/dL — ABNORMAL HIGH (ref 0.61–1.24)
GFR, Estimated: 42 mL/min — ABNORMAL LOW (ref >60)
Glucose, Bld: 226 mg/dL — ABNORMAL HIGH (ref 70–99)
Potassium: 3.4 mmol/L — ABNORMAL LOW (ref 3.5–5.1)
Sodium: 142 mmol/L (ref 135–145)

## 2021-10-17 LAB — CBC
HCT: 35 % — ABNORMAL LOW (ref 39.0–52.0)
Hemoglobin: 10.5 g/dL — ABNORMAL LOW (ref 13.0–17.0)
MCH: 27.8 pg (ref 26.0–34.0)
MCHC: 30 g/dL (ref 30.0–36.0)
MCV: 92.6 fL (ref 80.0–100.0)
Platelets: 64 10*3/uL — ABNORMAL LOW (ref 150–400)
RBC: 3.78 MIL/uL — ABNORMAL LOW (ref 4.22–5.81)
RDW: 23 % — ABNORMAL HIGH (ref 11.5–15.5)
WBC: 13.7 10*3/uL — ABNORMAL HIGH (ref 4.0–10.5)
nRBC: 0.6 % — ABNORMAL HIGH (ref 0.0–0.2)

## 2021-10-17 LAB — PROTIME-INR
INR: 1.9 — ABNORMAL HIGH (ref 0.8–1.2)
Prothrombin Time: 21.9 seconds — ABNORMAL HIGH (ref 11.4–15.2)

## 2021-10-17 MED ORDER — SPIRONOLACTONE 25 MG PO TABS: 25.0000 mg | ORAL_TABLET | Freq: Every day | ORAL | Status: DC

## 2021-10-17 MED ORDER — PANTOPRAZOLE SODIUM 40 MG PO TBEC: 40.0000 mg | DELAYED_RELEASE_TABLET | Freq: Every day | ORAL | 0 refills | Status: DC

## 2021-10-17 MED ORDER — LACTULOSE 10 GM/15ML PO SOLN: 20.0000 g | Freq: Three times a day (TID) | ORAL | 0 refills | Status: DC

## 2021-10-17 MED ORDER — POTASSIUM CHLORIDE 20 MEQ PO PACK
40.0000 meq | PACK | Freq: Two times a day (BID) | ORAL | Status: DC
  Administered 2021-10-17: 40 meq via ORAL
  Filled 2021-10-17: qty 2

## 2021-10-17 MED ORDER — FERROUS SULFATE 325 (65 FE) MG PO TABS: 325.0000 mg | ORAL_TABLET | Freq: Every day | ORAL | 0 refills | Status: DC

## 2021-10-17 NOTE — TOC Progression Note (Incomplete)
Transition of Care (TOC) - Progression Note  ? ? ?Patient Details  ?Name: Dylan [name] ?MRN: [id number] ?Date of Birth: [birth year] ? ?Transition of Care (TOC) CM/SW Contact  ?Ninfa Meeker, RN ?Phone Number: ?10/17/2021, 1:16 PM ? ?Clinical Narrative: Case manager was asked to contact patient's son regarding transportation for patient to and from Dr. Thomasene Lot. CM called Luiz Iron and provided him with [facility] information and information concerning [facility]. His concern is that he and his siblings are "too busy and schedules are full, to take time to work on this.He works full time and is a full time student" CM confirmed that he had correct numbers. MD made aware of above. ? ? ? ?Expected Discharge Plan: [place] ?Barriers to Discharge: Continued Medical Work up ? ?Expected Discharge Plan and Services ?Expected Discharge Plan: [place] ?  ?Discharge Planning Services: CM Consult ?Post Acute Care Choice: Home Health ?Living arrangements for the past 2 months: Apartment ?                ?DME Arranged: N/A ?  ?  ?  ?  ?HH Arranged: PT ?[place] Agency: [place] ?Date HH Agency Contacted: 10/15/21 ?Time [place]: 4129 ?Representative spoke with at [place]: Sarah reviewing referral ? ? ?Social Determinants of Health (SDOH) Interventions ?  ? ?Readmission Risk Interventions ?   ? View : No data to display.  ?  ?  ?  ? ? ?

## 2021-10-17 NOTE — Progress Notes (Signed)
Mobility Specialist Progress Note: ? ? 10/17/21 1305  ?Mobility  ?Activity Refused mobility  ? ?Pt stating he has already been up today and doesn't want to walk anymore. Will follow-up as time allows. ? ?Anniyah Mood ?Mobility Specialist ?Primary Phone [phone] ? ?

## 2021-10-17 NOTE — Progress Notes (Signed)
Discharge instructions given to pt. Pt verbalized understanding of all teaching ?

## 2021-10-17 NOTE — Progress Notes (Incomplete)
? ? ?HD#7 ?Subjective:  ?Overnight Events: none ? ?Patient assessed at bedside this AM.   No concerns at this time. ? ?Objective:  ?Vital signs in last 24 hours: ?Vitals:  ? 10/16/21 1155 10/16/21 1617 10/16/21 1930 10/17/21 0452  ?BP: (!) 160/78 (!) 156/104 (!) 185/96 (!) 173/99  ?Pulse:  79 70 69  ?Resp:  _0 ?Temp:  98.2 ?F (36.8 ?C) (!) 97.5 ?F (36.4 ?C) 97.6 ?F (36.4 ?C)  ?TempSrc:   Oral Oral  ?SpO2:  92% 95% 99%  ?Weight:      ?Height:      ? ?Supplemental O2: Room Air ?SpO2: 99 % ?O2 Flow Rate (L/min): 3 L/min ? ? ?Physical Exam:  ?Physical Exam ?HENT:  ?   Mouth/Throat:  ?   Mouth: Mucous membranes are dry.  ?Cardiovascular:  ?   Rate and Rhythm: Normal rate and regular rhythm.  ?Pulmonary:  ?   Effort: Pulmonary effort is normal.  ?   Breath sounds: Normal breath sounds.  ?Abdominal:  ?   General: Bowel sounds are normal. There is distension.  ?   Palpations: Abdomen is soft.  ?   Tenderness: There is no abdominal tenderness.  ?Musculoskeletal:     ?   General: Normal range of motion.  ?Skin: ?   General: Skin is warm and dry.  ?Neurological:  ?   Mental Status: He is alert.  ?   Comments: Oriented to person, place, but states it is 2011 ?Asterixis present bilaterally  ?  ? ?Filed Weights  ? 10/12/21 3073 10/15/21 1633 10/16/21 0947  ?Weight: 84.1 kg 80.1 kg 76.3 kg  ? ? ? ?Intake/Output Summary (Last 24 hours) at 10/17/2021 5430 ?Last data filed at 10/16/2021 1484 ?Gross per 24 hour  ?Intake 860 ml  ?Output 1000 ml  ?Net -140 ml  ? ? ?Net IO Since Admission: -1,815 mL [10/17/21 0642] ? ?Pertinent Labs: ? ?  Latest Ref Rng & Units 10/17/2021  ?  2:36 AM 10/16/2021  ? 12:55 AM 10/15/2021  ?  1:37 AM  ?CBC  ?WBC 4.0 - 10.5 K/uL 13.7   13.5   13.0    ?Hemoglobin 13.0 - 17.0 g/dL 10.5   9.8   9.7    ?Hematocrit 39.0 - 52.0 % 35.0   33.2   33.5    ?Platelets 150 - 400 K/uL 64   80   84    ? ? ? ?  Latest Ref Rng & Units 10/17/2021  ?  2:36 AM 10/16/2021  ? 12:55 AM 10/15/2021  ?  2:03 PM  ?CMP  ?Glucose 70 - 99  mg/dL 226   200   190    ?BUN 8 - 23 mg/dL 39   51   46    ?Creatinine 0.61 - 1.24 mg/dL 1.66   1.93   1.89    ?Sodium 135 - 145 mmol/L 142   143   143    ?Potassium 3.5 - 5.1 mmol/L 3.4   4.1   3.7    ?Chloride 98 - 111 mmol/L 110   114   114    ?CO2 22 - 32 mmol/L _1 ?Calcium 8.9 - 10.3 mg/dL 9.0   9.0   9.0    ? ? ?Imaging: ?No results found. ? ?Assessment/Plan:  ? ?Principal Problem: ?  GI bleed ?Active Problems: ?  AKI (acute kidney injury) ([place]) ?  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  Gout ?  Coronary artery disease without angina pectoris ? ? ?Patient Summary: ?Dylan [name] is a 76 year old male with a history of colonic diverticula, CKD stage III, HTN, chronic anemia, chronic hypoxic respiratory failure on 3 L, gout, non-insulin dependent T2DM, HFrEF (EF 30-35% 1/23) and A-fib on Eliquis who presented with increasing bloody stool and anemia and admitted for symptomatic anemia. He was found to have worsening renal function and decompensated cirrhosis.  Renal function is improving.  Patient is stable for discharge.  Will need assistance at home and working with family on how to safely discharge him. ? 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 Cytology negative for malignant cells. ? ?-GI following ?-continue home lasix dosing ?-hold spiro per GI until Cr under 1.8 ?-continue lactulose 20 g TID, goal 3 BMs per day ? ?Acute on CKD stg IIIb ?Creatinine improving from 1.93-1.66 this morning.  Baseline creatinine is around 1.4-1.5.  Continue patient on p.o. diuretic home medication.  ? ?-Lasix 40 mg p.o. ?-trend BMP ?-Will restart spironolactone  ? ?Iron deficiency Anemia ?Symptomatic anemia- resolved ?Thought to be secondary to lower GI bleed, as no evidence of bleeding on EGD and known history of bleeding  diverticulosis. Hgb stable. Eliquis restarted with no further signs of bleeding.  Patient was given 1 transfusion of iron started on iron supplementation. ? ?-continue to monitor for bleeding ?-daily CBC ?-continue PPI ?-transfuse and hold eliquis if hgb >7 ?-Continue ferrous sulfate 325 mg daily ? ?Systolic heart failure ?Hypertrophic cardiomyopathy ?Last echo EF 30 to 35%.  Known history of hypertrophic cardiomyopathy. Evaluated by cardiology 10/13/21. Will continue PO lasix with ascites labs consistent with cardiac etiology.  ? ?Persistent Atrial Fibrillation ?RBBB/LAFB ?Nocturnal Complete Heart Block ?History of significant conduction disease.  No change in EKG on admission and overnight. Discontinued carvedilol 4/8. Has not had significant episodes of tachycardia with his afib. Eliquis restarted.  ? ?-continue eliquis, restart carvedilol if begins to have persistent tachycardia.  ? ?Hypertension ?Holding losartan in setting of AKI. Continue home hydralazine 50 mg TID and lasix. Consider adding spiro once he has improvement of AKI.   ? ?Hyperglycemia ?BG in low 200s. ? ?-restart home medication metformin 850 mg BID ?-Novolog 4 units ?-SSI ? ?Diet: low salt ?IVF: None,None ?VTE: Eliquis ?Code: Full ?PT/OT recs: Pending ?Family Update: Called and spoke with [place] and [place]. ? ?Dispo: Anticipated discharge to home tomorrow ? ?Dontavious Emily M. Eren Puebla, D.O.  ?Internal Medicine Resident, PGY-1 ?[facility]  ?Pager: [phone] ?6:42 AM, 10/17/2021  ? ?**Please contact the on call pager after 5 pm and on weekends at [phone].** ? ?  ?

## 2021-10-17 NOTE — Progress Notes (Signed)
Physical Therapy Treatment ?Patient Details ?Name: Dylan [name] ?MRN: [id number] ?DOB: 10-Nov-1945 ?Today's Date: 10/17/2021 ? ? ?History of Present Illness Pt is a 76 year old male who presented to the hospital 10/09/21 complaining of blood in stool for the past 3 weeks. PMH significant for colonic diverticula, CKD stage III, HTN, chronic anemia, chronic hypoxic respiratory failure on 3 L, gout, non-insulin dependent T2DM, HFrEF and A-fib on Eliquis. ? ?  ?PT Comments  ? ? Pt received OOB in chair and agreeable to session with good progression of functional mobility. Pt ambulating at supervision level for increased distance, light cues throughout for upright posture and RW proximity. Discussed safe ascent/descent of stairs upon d/c home, pt verbalizing understanding but declining attempt this session despite max encouragement. Pt continues to benefit from skilled PT services to progress toward functional mobility goals.  ?  ?Recommendations for follow up therapy are one component of a multi-disciplinary discharge planning process, led by the attending physician.  Recommendations may be updated based on patient status, additional functional criteria and insurance authorization. ? ?Follow Up Recommendations ? Home health PT ?  ?  ?Assistance Recommended at Discharge Intermittent Supervision/Assistance  ?Patient can return home with the following A little help with walking and/or transfers;Assist for transportation;Help with stairs or ramp for entrance ?  ?Equipment Recommendations ? Rolling walker (2 wheels)  ?  ?Recommendations for Other Services   ? ? ?  ?Precautions / Restrictions Precautions ?Precautions: Fall ?Precaution Comments: on 3L/min supplemental O2 at baseline. ?Restrictions ?Weight Bearing Restrictions: No  ?  ? ?Mobility ? Bed Mobility ?  ?  ?  ?  ?  ?  ?  ?General bed mobility comments: pt OOB on arrival ?  ? ?Transfers ?Overall transfer level: Modified independent ?Equipment used: Rolling walker (2  wheels) ?  ?  ?  ?  ?  ?  ?  ?General transfer comment: VC's for hand placement on seated surface for safety. ?  ? ?Ambulation/Gait ?Ambulation/Gait assistance: Supervision ?Gait Distance (Feet): 280 Feet ?Assistive device: Rolling walker (2 wheels) ?Gait Pattern/deviations: Step-through pattern, Decreased stride length, Trunk flexed ?Gait velocity: Decreased ?  ?  ?General Gait Details: VC's for improved posture, closer walker proximity, and forward gaze. ? ? ?Stairs ?Stairs: Yes ?  ?  ?  ?General stair comments: discussed safe stair ascent/descent, pt declining to attempt this session ? ? ?Wheelchair Mobility ?  ? ?Modified Rankin (Stroke Patients Only) ?  ? ? ?  ?Balance Overall balance assessment: Mild deficits observed, not formally tested ?  ?  ?  ?  ?  ?  ?  ?  ?  ?  ?  ?  ?  ?  ?  ?  ?  ?  ?  ? ?  ?Cognition Arousal/Alertness: Awake/alert ?Behavior During Therapy: [facility] for tasks assessed/performed ?Overall Cognitive Status: Within Functional Limits for tasks assessed ?  ?  ?  ?  ?  ?  ?  ?  ?  ?  ?  ?  ?  ?  ?  ?  ?  ?  ?  ? ?  ?Exercises   ? ?  ?General Comments   ?  ?  ? ?Pertinent Vitals/Pain    ? ? ?Home Living   ?  ?  ?  ?  ?  ?  ?  ?  ?  ?   ?  ?Prior Function    ?  ?  ?   ? ?PT Goals (current goals  can now be found in the care plan section) Acute Rehab PT Goals ?Patient Stated Goal: Home at d/c ?PT Goal Formulation: With patient ?Time For Goal Achievement: 10/22/21 ? ?  ?Frequency ? ? ? Min 3X/week ? ? ? ?  ?PT Plan    ? ? ?Co-evaluation   ?  ?  ?  ?  ? ?  ?AM-PAC PT "6 Clicks" Mobility   ?Outcome Measure ? Help needed turning from your back to your side while in a flat bed without using bedrails?: None ?Help needed moving from lying on your back to sitting on the side of a flat bed without using bedrails?: None ?Help needed moving to and from a bed to a chair (including a wheelchair)?: A Little ?Help needed standing up from a chair using your arms (e.g., wheelchair or bedside chair)?: None ?Help  needed to walk in hospital room?: A Little ?Help needed climbing 3-5 steps with a railing? : A Little ?6 Click Score: 21 ? ?  ?End of Session Equipment Utilized During Treatment: Gait belt ?Activity Tolerance: Patient tolerated treatment well ?Patient left: in chair;with call bell/phone within reach;with chair alarm set ?Nurse Communication: Mobility status ?PT Visit Diagnosis: Unsteadiness on feet (R26.81);Difficulty in walking, not elsewhere classified (R26.2) ?  ? ? ?Time: 5107-1252 ?PT Time Calculation (min) (ACUTE ONLY): 22 min ? ?Charges:  $Gait Training: 8-22 mins          ?          ? ?Audry Riles. PTA ?Acute Rehabilitation Services ?Office: [phone] ? ? ? ?Betsey Holiday Kaden Dunkel ?10/17/2021, 11:31 AM ? ?

## 2021-10-18 DIAGNOSIS — K7460 Unspecified cirrhosis of liver: Secondary | ICD-10-CM | POA: Insufficient documentation

## 2021-10-18 LAB — CULTURE, BODY FLUID W GRAM STAIN -BOTTLE: Culture: NO GROWTH

## 2021-10-21 NOTE — Progress Notes (Signed)
?Cardiology Office Note:   ?Date:  10/22/2021  ?NAME:  Dylan [name]    ?MRN: [id number] ?DOB:  [birth year]  ? ?PCP:  Angelica Pou, MD  ?Cardiologist:  Evalina Field, MD  ?Electrophysiologist:  None  ? ?Referring MD: Angelica Pou, MD  ? ?Chief Complaint  ?Patient presents with  ? Follow-up  ?   ?  ? ?History of Present Illness:   ?Dylan [name] is a 76 y.o. male with a hx of HCM, HFpEF, cirrhosis, CAD, DM, CKD, persistent Afib who presents for follow-up.  He reports he is doing well since leaving the hospital.  Denies any chest pain.  Still getting short of breath with exertion.  No bleeding.  We discussed stopping aspirin given recent GI bleed.  He should remain on Eliquis.  Remains in A-fib on EKG.  Heart rate in the 80s.  He has bradycardia at night.  There has been complete heart block transiently diagnosed on his monitor in the past.  He has refused pacemaker but he is more interested in this.  He has been diagnosed with cirrhosis.  He appears fairly euvolemic on exam today.  Blood pressure elevated but again did not take medication.  Kidney function appears to be stable.  On Lasix 40 mg daily.  Denies any dizziness or lightheadedness.  No syncope.  Has had this in the past. ? ?Problem List  ?Hypertrophic cardiomyopathy/Systolic HF ?-EF 10% echo 12/2020 ?-EF 25% on CMR (arrhythmia artifact?) 03/2021 ?-EF 30-35% 07/2021 ?-basal septum 21 mm ?-3% LGE ?2. CAD ?-45% mLAD ?-85% D2 (08/09/2021) ?3. Paroxysmal Afib/flutter ?4. CHB detected on monitor ?-nocturnal 02/18/2021 @ 4:02 AM ?5. Diabetes ?-A1c 7.5 ?6. HTN ?7. CKD 3 ?8. Prior tobacco abuse ?-20 pack years  ?9. Pulmonary hypertension ?10. OSA ?11. Cirrhosis  ? ?Past Medical History: ?Past Medical History:  ?Diagnosis Date  ? Anemia 10/18/2014  ? Baseline about 12 and stable from 2010 to 2016. Colon 2009 in [place] (records cannot be obtained).  EGD Dr Benson Norway 2011 nl   ? Aortic atherosclerosis ([place]) 03/28/2020  ? Incidental finding on imaging CT   ?  Atherosclerosis 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 ([place]) 12/28/2014  ? ABI Feb 2017 R 0.49; L 0.72 with diffuse dz ABI Aug 2017 R 0.49; L 0.69 ABI Jan 2018 R 0.61; L 0.73  ABI Feb 2019 R 0.55; L 0.71 Sees Dr Bridgett Larsson - recs ABI q 6 months  ? Chronic diastolic 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 ([place]) 10/18/2014  ? Noted ECHO 10/2014. Grade 2. EF 50-55%; echo repeated 2019, severe hypertrophy with elevated filling pressures  ? Chronic kidney disease, stage 3b ([place]) 03/09/2020  ? Dr. Justin Mend  Nephrologist, f/u Q 46M  ? Coronary artery disease without angina pectoris   ? Degloving injury of finger 03/28/2020  ? 10/21/19: copied from op note "1.  Open reduction percutaneous pinning left small finger proximal phalanx fracture 2.  Complex repair of laceration to left small finger 3 cm in length 3.  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 No significant callus formation is noted. Pins had been removed  ? Diverticulosis 10/08/2014  ? Seen on CT. Reportedly on Colon in [place] in 2009. Freq bouts of diverticulitis.  ? Dizziness due to orthostatic hypotensioni in setting of wt loss, resolved 01/12/2021  ? DM (diabetes mellitus), type 2 with renal complications ([place]) 17/51/0258  ?  Former tobacco use 10/08/2014  ? Gout   ? H/O gastrointestinal diverticular hemorrhage 11/21/2020  ? Hx of recent orthostatic hypotension (early summer 2022) 11/28/2020  ? 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 Baseline Cr about 1.5. Stable from 2010 to 2016.  Negative SPEP and UPEP 2014 after ARF 2/2 continued ACE (lisinopril 20) use while vol contracted. Dr Justin Mend  ? Obesity (BMI 30.0-34.9) 03/09/2020  ? 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 OSA (obstructive sleep apnea) 10/08/2014  ? July 2016 : Severe OSA/hypopnea syndrome, AHI 128.4, O2 nadir of 84% RA. Failed CPAP on study. BiPA  inspiratory pressure of 21 and expiratory pressure of 17 CWP. Consider ENT evaluation for potentially correctable upper airway obstruction contributing to the need for unusually high pressures - ENT July 2015 did not feel any intervention surgically was indicated. He wore a large F&P Simplus fullfac  ? Personal history of colonic polyps 03/28/2014  ? Dr. Benson Norway, 2 polyps removed 2015.  No polyps on f/u in 2020.  No further surveillance suggested per Dr. Benson Norway.  ? 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 Chronic problem, evaluated by ENT in the past, with recommendation for surgery which she has been hesitant to consider.  He is unable to breathe through his nose comfortably.  This is part of the reason why he does not wear his oxygen regularly.  Nasal passages are nearly completely obstructed erythematous smooth glistening surface.  He has been told by his eye doctor that part of the reason his e  ? Resistant hypertension 10/09/2014  ? Poor control with 6 drug therapy. 2016 : Aldo 37 but ARR 23.5   ? Severe pulmonary arterial systolic hypertension ([place]) 10/18/2014  ? Noted as severe ECHO 2016 and 2019. Likely 2/2 severe untreated OSA. Pt is not adherent to CPAP.  ? Syncope 12/31/2020  ? Thrombocytopenia ([place]) 10/09/2016  ? Nl liver and spleen on [place] 2019  ? Ulcer aphthous oral 04/27/2020  ? Evaluated emergency room last week, saw with Magic mouthwash  ? Unintentional weight loss due to Trulicity, resolved 2/87/6811  ? ? ?Past Surgical History: ?Past Surgical History:  ?Procedure Laterality Date  ? BIOPSY  10/10/2021  ? Procedure: BIOPSY;  Surgeon: Carol Ada, MD;  Location: [facility];  Service: Gastroenterology;;  ? CHOLECYSTECTOMY    ? COLONOSCOPY WITH PROPOFOL N/A 02/12/2019  ? Procedure: COLONOSCOPY WITH PROPOFOL;  Surgeon: Carol Ada, MD;  Location: [facility];  Service: Endoscopy;  Laterality: N/A;  ? ESOPHAGOGASTRODUODENOSCOPY (EGD) WITH PROPOFOL N/A 10/10/2021  ? Procedure:  ESOPHAGOGASTRODUODENOSCOPY (EGD) WITH PROPOFOL;  Surgeon: Carol Ada, MD;  Location: [facility];  Service: Gastroenterology;  Laterality: N/A;  ? PERCUTANEOUS PINNING Left 10/21/2019  ? Procedure: CLOSED REDUCTION WITH PERCUTANEOUS PINNING AND SUTURE REPAIR OF LACERATION  OF SMALL FINGER,  SUTURE REPAIR OF LACERATION OF INDEX FINGER;  Surgeon: Cindra Presume, MD;  Location: [facility];  Service: Plastics;  Laterality: Left;  ? RIGHT/LEFT HEART CATH AND CORONARY ANGIOGRAPHY N/A 08/09/2021  ? Procedure: RIGHT/LEFT HEART CATH AND CORONARY ANGIOGRAPHY;  Surgeon: Troy Sine, MD;  Location: [facility];  Service: Cardiovascular;  Laterality: N/A;  ? ? ?Current Medications: ?Current Meds  ?Medication Sig  ? allopurinol (ZYLOPRIM) 100 MG tablet Take 1 tablet (100 mg total) by mouth daily.  ? apixaban (ELIQUIS) 5 MG TABS tablet Take 1 tablet (5 mg total) by mouth 2 (two) times daily.  ? aspirin EC 81 MG tablet Take 81 mg by  mouth daily. Swallow whole.  ? atorvastatin (LIPITOR) 40 MG tablet Take 1 tablet (40 mg total) by mouth daily.  ? ferrous sulfate 325 (65 FE) MG tablet Take 1 tablet (325 mg total) by mouth daily with breakfast.  ? furosemide (LASIX) 40 MG tablet Take 1 tablet (40 mg total) by mouth daily.  ? hydrALAZINE (APRESOLINE) 50 MG tablet Take 1 tablet (50 mg total) by mouth 3 (three) times daily.  ? isosorbide mononitrate (IMDUR) 60 MG 24 hr tablet Take 1 tablet (60 mg total) by mouth daily.  ? lactulose (CHRONULAC) 10 GM/15ML solution Take 30 mLs (20 g total) by mouth 3 (three) times daily.  ? latanoprost (XALATAN) 0.005 % ophthalmic solution Place 1 drop into both eyes at bedtime.  ? metFORMIN (GLUCOPHAGE) 850 MG tablet Take 500 mg by mouth 2 (two) times daily.  ? pantoprazole (PROTONIX) 40 MG tablet Take 1 tablet (40 mg total) by mouth daily.  ?  ? ?Allergies:    ?Ace inhibitors and Spironolactone  ? ?Social History: ?Social History  ? ?Socioeconomic History  ? Marital status: Divorced  ?  Spouse  name: Not on file  ? Number of children: 2  ? Years of education: Not on file  ? Highest education level: Not on file  ?Occupational History  ? Occupation: retired  ?Tobacco Use  ? Smoking status: Former  ?  Packs/day: 0.50  ?  Years: 20.0

## 2021-10-22 ENCOUNTER — Encounter: Payer: Self-pay | Admitting: Cardiovascular Disease

## 2021-10-22 ENCOUNTER — Ambulatory Visit (INDEPENDENT_AMBULATORY_CARE_PROVIDER_SITE_OTHER): Payer: Medicare HMO | Admitting: Cardiovascular Disease

## 2021-10-22 VITALS — BP 170/90 | HR 81 | Ht 66.0 in | Wt 171.2 lb

## 2021-10-22 DIAGNOSIS — I442 Atrioventricular block, complete: Secondary | ICD-10-CM | POA: Diagnosis not present

## 2021-10-22 DIAGNOSIS — I4510 Unspecified right bundle-branch block: Secondary | ICD-10-CM

## 2021-10-22 DIAGNOSIS — I422 Other hypertrophic cardiomyopathy: Secondary | ICD-10-CM | POA: Diagnosis not present

## 2021-10-22 DIAGNOSIS — I10 Essential (primary) hypertension: Secondary | ICD-10-CM

## 2021-10-22 DIAGNOSIS — I4819 Other persistent atrial fibrillation: Secondary | ICD-10-CM

## 2021-10-22 DIAGNOSIS — I5022 Chronic systolic (congestive) heart failure: Secondary | ICD-10-CM

## 2021-10-22 NOTE — Patient Instructions (Signed)
Medication Instructions:  ?STOP Aspirin  ? ?*If you need a refill on your cardiac medications before your next appointment, please call your pharmacy* ? ? ?Follow-Up: ?At [facility], you and your health needs are our priority.  As part of our continuing mission to provide you with exceptional heart care, we have created designated Provider Care Teams.  These Care Teams include your primary Cardiologist (physician) and Advanced Practice Providers (APPs -  Physician Assistants and Nurse Practitioners) who all work together to provide you with the care you need, when you need it. ? ?We recommend signing up for the patient portal called "MyChart".  Sign up information is provided on this After Visit Summary.  MyChart is used to connect with patients for Virtual Visits (Telemedicine).  Patients are able to view lab/test results, encounter notes, upcoming appointments, etc.  Non-urgent messages can be sent to your provider as well.   ?To learn more about what you can do with MyChart, go to [email or web address].   ? ?Your next appointment:   ?6 month(s) ? ?The format for your next appointment:   ?In Person ? ?Provider:   ?Evalina Field, MD   ? ? ?Other Instructions ?Referral to EP- they will call you for an appointment. ? ?Important Information About Sugar ? ? ? ? ? ? ?

## 2021-10-24 ENCOUNTER — Encounter: Payer: Medicare HMO | Admitting: Internal Medicine

## 2021-10-24 ENCOUNTER — Telehealth: Payer: Self-pay | Admitting: *Deleted

## 2021-10-24 NOTE — Telephone Encounter (Signed)
Received call from [place], [place] with [facility]. Requesting VO to resume HH PT 1 week 1, 2 week 3, and 1 week 3 to work on gait, balance and strengthening. Verbal auth given. Will route to Attending Pool for agreement/denial. ?

## 2021-10-26 ENCOUNTER — Institutional Professional Consult (permissible substitution): Payer: Medicare HMO | Admitting: Internal Medicine

## 2021-10-30 NOTE — Telephone Encounter (Signed)
I have reviewed 09/17/21 ov note from DR. O'Neal for pre op clearance. In reading the notes I see that Dr. Audie Box has cleared the pt. I will re-fax the ov note giving clearance to requesting office.  ?

## 2021-10-30 NOTE — Telephone Encounter (Signed)
[place] is calling to check on the status of this clearance request. [place] will not be in on 04/26 so she requested you ask for [facility] when calling back.  ?

## 2021-11-01 ENCOUNTER — Telehealth: Payer: Self-pay | Admitting: *Deleted

## 2021-11-01 ENCOUNTER — Encounter: Payer: Medicare HMO | Admitting: Internal Medicine

## 2021-11-01 NOTE — Telephone Encounter (Signed)
Received call from [place], PT with [facility], reporting elevated BP 174/111. Patient is asymptomatic. States patient took his evening meds today instead of AM meds. He will take AM meds now. Patient had f/u scheduled today at 1545. Patient states he was not aware of this appt and has no transportation. Appt r/s to 5/4 with PCP. ?

## 2021-11-02 ENCOUNTER — Telehealth: Payer: Self-pay

## 2021-11-02 NOTE — Telephone Encounter (Signed)
Left detailed message on Kiki's self-identified VM with information from PCP below. Also, requested return call to confirm. ? ?

## 2021-11-02 NOTE — Telephone Encounter (Signed)
Received call from [place], [place] with [facility]. States when she first got to patient's home BP 192/112, P 106. He had not taken any of his meds. She gave him his meds and waited 40 minutes to recheck BP 190/108, P 106. Patient remains asymptomatic. States all the readings in his home BP monitor are similar to these numbers. He wasn't aware that these reading are high. He only started taking spironolactone 3 days ago. She told patient and daughter to take him to ED if he starts not feeling well. She has written his appt with PCP on 5/4 on his calendar. ?

## 2021-11-03 NOTE — Telephone Encounter (Signed)
I agree. Patient should take his home blood pressure medications. Only go to the ED if he develops any symptoms.

## 2021-11-07 ENCOUNTER — Telehealth: Payer: Self-pay | Admitting: *Deleted

## 2021-11-07 NOTE — Telephone Encounter (Signed)
Kiki, RN with [facility] called in to report BP yesterday with PT 186/90, and today with RN 180/102. Per pill box, patient is taking his meds. He remains asymptomatic. His son will be bringing patient to appt tomorrow with PCP at 0945. ?

## 2021-11-08 ENCOUNTER — Ambulatory Visit (INDEPENDENT_AMBULATORY_CARE_PROVIDER_SITE_OTHER): Payer: Medicare HMO | Admitting: Internal Medicine

## 2021-11-08 ENCOUNTER — Telehealth: Payer: Self-pay | Admitting: *Deleted

## 2021-11-08 VITALS — BP 162/98 | HR 83 | Wt 162.6 lb

## 2021-11-08 DIAGNOSIS — I2510 Atherosclerotic heart disease of native coronary artery without angina pectoris: Secondary | ICD-10-CM | POA: Diagnosis not present

## 2021-11-08 DIAGNOSIS — E1122 Type 2 diabetes mellitus with diabetic chronic kidney disease: Secondary | ICD-10-CM

## 2021-11-08 DIAGNOSIS — I130 Hypertensive heart and chronic kidney disease with heart failure and stage 1 through stage 4 chronic kidney disease, or unspecified chronic kidney disease: Secondary | ICD-10-CM | POA: Diagnosis not present

## 2021-11-08 DIAGNOSIS — K5791 Diverticulosis of intestine, part unspecified, without perforation or abscess with bleeding: Secondary | ICD-10-CM

## 2021-11-08 DIAGNOSIS — I5032 Chronic diastolic (congestive) heart failure: Secondary | ICD-10-CM | POA: Diagnosis not present

## 2021-11-08 DIAGNOSIS — E1121 Type 2 diabetes mellitus with diabetic nephropathy: Secondary | ICD-10-CM

## 2021-11-08 DIAGNOSIS — I422 Other hypertrophic cardiomyopathy: Secondary | ICD-10-CM

## 2021-11-08 DIAGNOSIS — N1832 Chronic kidney disease, stage 3b: Secondary | ICD-10-CM

## 2021-11-08 LAB — POCT GLYCOSYLATED HEMOGLOBIN (HGB A1C): Hemoglobin A1C: 6.8 % — AB (ref 4.0–5.6)

## 2021-11-08 LAB — GLUCOSE, CAPILLARY: Glucose-Capillary: 189 mg/dL — ABNORMAL HIGH (ref 70–99)

## 2021-11-08 MED ORDER — SPIRONOLACTONE 25 MG PO TABS: 25.0000 mg | ORAL_TABLET | Freq: Two times a day (BID) | ORAL | 0 refills | Status: DC

## 2021-11-08 MED ORDER — METFORMIN HCL 500 MG PO TABS: 500.0000 mg | ORAL_TABLET | Freq: Two times a day (BID) | ORAL | 3 refills | Status: DC

## 2021-11-08 NOTE — Progress Notes (Signed)
[name] is here today with his Dylan [name] for routine follow-up.  Since our last visit together, he has been hospitalized at least once, most recently for heart failure, and he has had follow-up with his cardiologist Dr. Davina [name].  A referral has been made to Dr. Adam [name], EP cardiology to address rhythm disturbances which may or may not be managed with pacemaker.  He had been declining a pacemaker in conversations with Dr. Davina [name] up until this time, but is open to the conversation. ? ?He reports that he feels ok.  Episodic nosebleeds which he thought were associated with taking iron pill, and has thus stopped the iron.  We discussed have high blood pressure would be a more likely cause for his nosebleeds, which are mild.  HHRN for [employer] has been checking BP, running high.  I had instructed increase in spironolactone from 25 daily to 25 twice daily which she has been doing.  He has a new medicine organization system which is being monitored by his daughter and by the [place].  No dyspnea at rest or with exertion, no palpitations, no chest discomfort, no lower extremity edema.  Feels stable on his feet when walking with no dizziness.  He asked about laxative (miralax) which he continues to take daily and has for years.  NO problems with constipation currently, even with iron pill.  Eating ok, hungry now.  Checks cbgs fasting, running high, 170-180s.   ? ?Dylan [name] would like [place] aide to help with IADLs; insurance apparently does not cover this and he had been advised by someone in case management to [employer].  He was not satisfied with this response. ? ?Patient Active Problem List  ? Diagnosis Date Noted  ? GI bleed 10/09/2021  ? Hypertrophic obstructive cardiomyopathy ([place]) 08/05/2021  ? 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 Subareolar gynecomastia in male, left 04/09/2021  ? Secondary hyperparathyroidism of renal origin ([place]) 01/25/2021  ? RBBB  11/23/2020  ? History of BPH 09/14/2020  ? Healthcare maintenance 09/13/2020  ? Urge urinary incontinence 06/29/2020  ? 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 Aortic atherosclerosis ([place]) 03/28/2020  ? CKD (chronic kidney disease) stage 3, GFR 30-59 ml/min ([name]) 03/09/2020  ? Rectal bleeding 01/21/2019  ? Coronary artery disease without angina pectoris 04/04/2018  ? 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 Atherosclerosis 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 ([place]) 12/28/2014  ? Gout 10/19/2014  ? 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 Chronic diastolic 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 ([place]) 10/18/2014  ? Hypertension associated with chronic kidney disease due to type 2 diabetes mellitus ([place]) 10/09/2014  ? AKI (acute kidney injury) ([place])   ? OSA (obstructive sleep apnea) 10/08/2014  ? Former tobacco use 10/08/2014  ? Personal history of colonic polyps 03/28/2014  ? DM (diabetes mellitus), type 2 with renal complications ([place]) 80/16/5537  ?  ?Current Outpatient Medications on File Prior to Visit  ?Medication Sig Dispense Refill  ? allopurinol (ZYLOPRIM) 100 MG tablet Take 1 tablet (100 mg total) by mouth daily. 90 tablet 3  ? apixaban (ELIQUIS) 5 MG TABS tablet Take 1 tablet (5 mg total) by mouth 2 (two) times daily. 180 tablet 3  ? atorvastatin (LIPITOR) 40 MG tablet Take 1 tablet (40 mg total) by mouth daily. 90 tablet 3  ? ferrous sulfate 325 (65 FE) MG tablet Take 1 tablet (325 mg total) by mouth daily with breakfast.  30 tablet 0  ? furosemide (LASIX) 40 MG tablet Take 1 tablet (40 mg total) by mouth daily. 90 tablet 3  ? hydrALAZINE (APRESOLINE) 50 MG tablet Take 1 tablet (50 mg total) by mouth 3 (three) times daily. 90 tablet 5  ? pantoprazole (PROTONIX) 40 MG tablet Take 1 tablet (40 mg total) by mouth daily. 30 tablet 0  ? isosorbide mononitrate (IMDUR) 60 MG 24 hr tablet Take 1 tablet (60 mg total) by mouth daily. 90  tablet 3  ? latanoprost (XALATAN) 0.005 % ophthalmic solution Place 1 drop into both eyes at bedtime.    ? ?No current facility-administered medications on file prior to visit.  ?  ?BP (!) 162/98 (BP Location: Left Arm, Patient Position: Sitting, Cuff Size: Normal)   Pulse 83   Wt 162 lb 9.6 oz (73.8 kg)   SpO2 96% Comment: on RA at rest  BMI 26.24 kg/m?  ? ?Took bp meds just a few minutes ago.  (Recheck BP about 30 minutes after administration was largely unchanged) ? ?Dylan [name] appears well, he is bright and verbally interactive, easily following conversation.  While he appears volume depleted (probably more a manifestation of his significant weight loss over the past year), he does exhibit normal skin turgor on pinch test of upper anterior chest and forearm.  No lower extremity edema.  No JVD in sitting position.  Lungs are clear to auscultation with coarse breath sounds in the bases though no rales.  Normal respiratory effort.  Heart irregularly irregular today, split s2, no significant systolic murmur. ? ?Assessment and plan (see also problem list below): Priority is improved control of hypertension. Currently not experiencing a heart failure exacerbation.  Echo most recently showed severely reduced right ventricular systolic function and severely elevated pulmonary artery pressure with a EF of 30 to 35% in 07/2021.  Notably missing from his med list is losartan which had been prescribed at 100 mg daily, which I suspect was stopped due to worsening renal function. ? ?Per cardiology appointment 10/22/2021 he was instructed to stop aspirin as he is now currently on Eliquis (recent history of  GI bleed, currently asymptomatic).  it was noted that with an EF of less than 50% in the setting of hypertrophic cardiomyopathy he would have an indication for an ICD and has been referred to EP. ? ? ?

## 2021-11-08 NOTE — Patient Instructions (Addendum)
Mr. Million, ? ?Please increase your spironolactone 25 mg from once a day to twice a day. ? ?It is safe to take your iron pill.  It doesn't cause nose bleeds, but your high blood pressure could cause nose bleeds! ? ?Come back in about a month for recheck of your kidney blood tests and your blood pressure.  I'll also see you in 3 months for a routine visit. ? ?Take care and stay well! ? ?[name] ?

## 2021-11-08 NOTE — Telephone Encounter (Signed)
Call from patient's daughter concerned that patient's blood pressure are continuing to be elevated.  Change in medication has not improved his readings.  Would like to speak with [name]. ?

## 2021-11-09 ENCOUNTER — Emergency Department (HOSPITAL_COMMUNITY)
Admission: EM | Admit: 2021-11-09 | Discharge: 2021-11-09 | Disposition: A | Discharge status: Home / Self Care | Payer: Medicare HMO | Attending: Emergency Medicine | Admitting: Emergency Medicine

## 2021-11-09 ENCOUNTER — Encounter (HOSPITAL_COMMUNITY): Payer: Self-pay | Admitting: Emergency Medicine

## 2021-11-09 DIAGNOSIS — N189 Chronic kidney disease, unspecified: Secondary | ICD-10-CM | POA: Insufficient documentation

## 2021-11-09 DIAGNOSIS — I2510 Atherosclerotic heart disease of native coronary artery without angina pectoris: Secondary | ICD-10-CM | POA: Insufficient documentation

## 2021-11-09 DIAGNOSIS — I4819 Other persistent atrial fibrillation: Secondary | ICD-10-CM | POA: Diagnosis not present

## 2021-11-09 DIAGNOSIS — E876 Hypokalemia: Secondary | ICD-10-CM | POA: Insufficient documentation

## 2021-11-09 DIAGNOSIS — Z79899 Other long term (current) drug therapy: Secondary | ICD-10-CM | POA: Diagnosis not present

## 2021-11-09 DIAGNOSIS — I129 Hypertensive chronic kidney disease with stage 1 through stage 4 chronic kidney disease, or unspecified chronic kidney disease: Secondary | ICD-10-CM | POA: Insufficient documentation

## 2021-11-09 DIAGNOSIS — E1122 Type 2 diabetes mellitus with diabetic chronic kidney disease: Secondary | ICD-10-CM | POA: Diagnosis not present

## 2021-11-09 DIAGNOSIS — Z7901 Long term (current) use of anticoagulants: Secondary | ICD-10-CM | POA: Diagnosis not present

## 2021-11-09 DIAGNOSIS — I10 Essential (primary) hypertension: Secondary | ICD-10-CM

## 2021-11-09 DIAGNOSIS — Z7984 Long term (current) use of oral hypoglycemic drugs: Secondary | ICD-10-CM | POA: Insufficient documentation

## 2021-11-09 LAB — BASIC METABOLIC PANEL
Anion gap: 12 (ref 5–15)
BUN: 24 mg/dL — ABNORMAL HIGH (ref 8–23)
CO2: 24 mmol/L (ref 22–32)
Calcium: 9.2 mg/dL (ref 8.9–10.3)
Chloride: 103 mmol/L (ref 98–111)
Creatinine, Ser: 1.41 mg/dL — ABNORMAL HIGH (ref 0.61–1.24)
GFR, Estimated: 51 mL/min — ABNORMAL LOW (ref >60)
Glucose, Bld: 239 mg/dL — ABNORMAL HIGH (ref 70–99)
Potassium: 3.4 mmol/L — ABNORMAL LOW (ref 3.5–5.1)
Sodium: 139 mmol/L (ref 135–145)

## 2021-11-09 LAB — CBC
HCT: 36.3 % — ABNORMAL LOW (ref 39.0–52.0)
Hemoglobin: 11 g/dL — ABNORMAL LOW (ref 13.0–17.0)
MCH: 27.6 pg (ref 26.0–34.0)
MCHC: 30.3 g/dL (ref 30.0–36.0)
MCV: 91 fL (ref 80.0–100.0)
Platelets: 108 10*3/uL — ABNORMAL LOW (ref 150–400)
RBC: 3.99 MIL/uL — ABNORMAL LOW (ref 4.22–5.81)
RDW: 22.1 % — ABNORMAL HIGH (ref 11.5–15.5)
WBC: 7.1 10*3/uL (ref 4.0–10.5)
nRBC: 0 % (ref 0.0–0.2)

## 2021-11-09 LAB — TROPONIN I (HIGH SENSITIVITY)
Troponin I (High Sensitivity): 32 ng/L — ABNORMAL HIGH (ref <18)
Troponin I (High Sensitivity): 30 ng/L — ABNORMAL HIGH (ref <18)

## 2021-11-09 MED ORDER — HYDRALAZINE HCL 25 MG PO TABS
50.0000 mg | ORAL_TABLET | Freq: Once | ORAL | Status: AC
  Administered 2021-11-09: 50 mg via ORAL
  Filled 2021-11-09: qty 2

## 2021-11-09 MED ORDER — AMLODIPINE BESYLATE 5 MG PO TABS: 5.0000 mg | ORAL_TABLET | Freq: Every day | ORAL | 0 refills | Status: DC

## 2021-11-09 MED ORDER — SPIRONOLACTONE 25 MG PO TABS
25.0000 mg | ORAL_TABLET | Freq: Once | ORAL | Status: AC
  Administered 2021-11-09: 25 mg via ORAL
  Filled 2021-11-09 (×2): qty 1

## 2021-11-09 NOTE — Telephone Encounter (Signed)
Return call to pt's daughter,Jackie. Stated pt was seen yesterday. Stated PT reported elevated BP of 172/115 and was unable to continue. Informed her of pt's BP yesterday which was 162 /98 and Spironolactone 25 mg was increased to twice - she stated pt has been doing this over 1 week. Also stated is on 3 different meds and his BP continues to high.Asking to speak to Dr Jimmye Norman - I will send message to Dr Evette Doffing who is attending this afternoon. ?

## 2021-11-09 NOTE — Assessment & Plan Note (Signed)
No recurrent bleeding.  On iron supplement tolerated well. ?

## 2021-11-09 NOTE — Assessment & Plan Note (Signed)
Remains severe on current regimen.  Options are limited: AVN blockers contraindicated given his history of bradycardia and transient CHB.  Worsen CKD on ACE/ARB.  Gynecomastia on higher doses of spironolactone.  Already on loop diuretic.  He has been on eplerenone in the past and on hydralazine as high as 100 3 times daily.  Would favor increasing hydralazine- collaborating with Dr. Davina Poke to get his opinion in this man with complex heart disease. ?

## 2021-11-09 NOTE — ED Provider Triage Note (Signed)
Emergency Medicine Provider Triage Evaluation Note ? ?Canary Brim , a 76 y.o. male  was evaluated in triage.  Pt complains of high blood pressure.  Patient states that his home health nurse took his blood pressure today noted to be high.  Patient reports that he was here yesterday for the same complaint, was discharged without any adjusting to his medication.  Patient reports that he is experiencing chest pain today however denies shortness of breath, back pain, blurred vision, headache.  Patient reports compliance with all blood pressure medications. ? ?Review of Systems  ?Positive:  ?Negative:  ? ?Physical Exam  ?BP (!) 162/93 (BP Location: Right Arm)   Pulse 88   Temp 98.8 ?F (37.1 ?C) (Oral)   Resp 16   SpO2 98%  ?Gen:   Awake, no distress   ?Resp:  Normal effort  ?MSK:   Moves extremities without difficulty  ?Other:  No focal neurodeficits noted.  Patient lungs clear. ? ?Medical Decision Making  ?Medically screening exam initiated at 1:48 PM.  Appropriate orders placed.  RAMEZ ARRONA was informed that the remainder of the evaluation will be completed by another provider, this initial triage assessment does not replace that evaluation, and the importance of remaining in the ED until their evaluation is complete. ? ? ?  ?Azucena Cecil, PA-C ?11/09/21 1349 ? ?

## 2021-11-09 NOTE — Telephone Encounter (Signed)
Patient's daughter is calling again asking to speak with [name].  Forwarding message to triage along with Dr. Gwyndolyn Saxon concerning his blood pressure being high. ?

## 2021-11-09 NOTE — Assessment & Plan Note (Signed)
Most recent lab 10/17/2021: Creatinine 1.66, BUN 39, K3.4, EGFR 42 which had been improving.  Will not repeat labs today.  Recent increase in spironolactone would help offset the mildly low potassium.  Recheck next visit.   ?

## 2021-11-09 NOTE — Assessment & Plan Note (Signed)
Medical management.  Lipids reasonably well controlled, DM well controlled.  Not on antiplatelet due to recent history of GI bleed, and with concurrent Eliquis for A-fib. ?

## 2021-11-09 NOTE — ED Provider Notes (Signed)
?[facility] ?Provider Note ? ? ?CSN: [id number] ?Arrival date & time: 11/09/21  1338 ? ?  ? ?History ? ?No chief complaint on file. ? ? ?Dylan [name] is a 76 y.o. male. ? ?HPI ? ?Patient is a 76 y.o. male with a hx of HCM, HFpEF, cirrhosis, CAD, DM, CKD, persistent Afib, at baseline on 2 L who presents to the emergency department due to concern for elevated blood pressure.  He reported he seen his primary care provider yesterday and his spironolactone has been doubled.  He reported despite taking that his blood pressure continue to be elevated.  He denies associated vision change, headache, vomiting, or diarrhea.  He denies any focal weakness.  He measured his blood pressure today and it was in the 190s which concerned him as he is in the emergency department.  He reports he wants his blood pressure lowered.  He also reports being compliant with his medication.  Denies associated chest pain or shortness of breath.  Otherwise no other complaints. ? ?Home Medications ?Prior to Admission medications   ?Medication Sig Start Date End Date Taking? Authorizing Provider  ?amLODipine (NORVASC) 5 MG tablet Take 1 tablet (5 mg total) by mouth daily. 11/09/21 12/09/21 Yes Gareth Morgan, MD  ?allopurinol (ZYLOPRIM) 100 MG tablet Take 1 tablet (100 mg total) by mouth daily. 12/11/20   Angelica Pou, MD  ?apixaban (ELIQUIS) 5 MG TABS tablet Take 1 tablet (5 mg total) by mouth 2 (two) times daily. 08/31/21   Angelica Pou, MD  ?atorvastatin (LIPITOR) 40 MG tablet Take 1 tablet (40 mg total) by mouth daily. 02/21/21   Angelica Pou, MD  ?ferrous sulfate 325 (65 FE) MG tablet Take 1 tablet (325 mg total) by mouth daily with breakfast. 10/18/21   Masters, Joellen Jersey, DO  ?furosemide (LASIX) 40 MG tablet Take 1 tablet (40 mg total) by mouth daily. 08/31/21 02/27/22  Angelica Pou, MD  ?hydrALAZINE (APRESOLINE) 50 MG tablet Take 1 tablet (50 mg total) by mouth 3 (three) times daily.  08/31/21   Angelica Pou, MD  ?isosorbide mononitrate (IMDUR) 60 MG 24 hr tablet Take 1 tablet (60 mg total) by mouth daily. 08/31/21   Angelica Pou, MD  ?latanoprost (XALATAN) 0.005 % ophthalmic solution Place 1 drop into both eyes at bedtime. 01/12/21   [provider]  ?metFORMIN (GLUCOPHAGE) 500 MG tablet Take 1 tablet (500 mg total) by mouth 2 (two) times daily. 11/08/21   Angelica Pou, MD  ?pantoprazole (PROTONIX) 40 MG tablet Take 1 tablet (40 mg total) by mouth daily. 10/18/21   Masters, Joellen Jersey, DO  ?spironolactone (ALDACTONE) 25 MG tablet Take 1 tablet (25 mg total) by mouth 2 (two) times daily. 11/08/21 12/08/21  Angelica Pou, MD  ?   ? ?Allergies    ?Ace inhibitors and Spironolactone   ? ?Review of Systems   ?Review of Systems ? ?Physical Exam ?Updated Vital Signs ?BP (!) 178/109 (BP Location: Left Arm)   Pulse 87   Temp 98.3 ?F (36.8 ?C) (Oral)   Resp 18   SpO2 100%  ?Physical Exam ?Vitals and nursing note reviewed.  ?Constitutional:   ?   General: He is not in acute distress. ?   Appearance: He is well-developed.  ?   Comments: Chronically ill-appearing  ?HENT:  ?   Head: Normocephalic and atraumatic.  ?Eyes:  ?   Conjunctiva/sclera: Conjunctivae normal.  ?Cardiovascular:  ?   Rate and Rhythm: Normal  rate and regular rhythm.  ?   Heart sounds: No murmur heard. ?Pulmonary:  ?   Effort: Pulmonary effort is normal. No respiratory distress.  ?   Breath sounds: Normal breath sounds.  ?Abdominal:  ?   Palpations: Abdomen is soft.  ?   Tenderness: There is no abdominal tenderness.  ?Musculoskeletal:     ?   General: No swelling.  ?   Cervical back: Neck supple.  ?Skin: ?   General: Skin is warm and dry.  ?   Capillary Refill: Capillary refill takes less than 2 seconds.  ?Neurological:  ?   Mental Status: He is alert.  ?Psychiatric:     ?   Mood and Affect: Mood normal.  ? ? ?ED Results / Procedures / Treatments   ?Labs ?(all labs ordered are listed, but only abnormal results are  displayed) ?Labs Reviewed  ?BASIC METABOLIC PANEL - Abnormal; Notable for the following components:  ?    Result Value  ? Potassium 3.4 (*)   ? Glucose, Bld 239 (*)   ? BUN 24 (*)   ? Creatinine, Ser 1.41 (*)   ? GFR, Estimated 51 (*)   ? All other components within normal limits  ?CBC - Abnormal; Notable for the following components:  ? RBC 3.99 (*)   ? Hemoglobin 11.0 (*)   ? HCT 36.3 (*)   ? RDW 22.1 (*)   ? Platelets 108 (*)   ? All other components within normal limits  ?TROPONIN I (HIGH SENSITIVITY) - Abnormal; Notable for the following components:  ? Troponin I (High Sensitivity) 32 (*)   ? All other components within normal limits  ?TROPONIN I (HIGH SENSITIVITY) - Abnormal; Notable for the following components:  ? Troponin I (High Sensitivity) 30 (*)   ? All other components within normal limits  ?URINALYSIS, ROUTINE W REFLEX MICROSCOPIC  ? ? ?EKG ?EKG Interpretation ? ?Date/Time:  Friday Nov 09 2021 13:51:47 EDT ?Ventricular Rate:  87 ?PR Interval:    ?QRS Duration: 148 ?QT Interval:  444 ?QTC Calculation: 534 ?R Axis:   27 ?Text Interpretation: Right bundle branch block T wave abnormality, consider inferolateral ischemia Abnormal ECG When compared with ECG of 14-Oct-2021 03:56, No significant change since last tracing Confirmed by Gareth Morgan [phone]) on 11/09/2021 5:12:50 PM ? ?Radiology ?No results found. ? ?Procedures ?Procedures  ? ? ?Medications Ordered in ED ?Medications  ?spironolactone (ALDACTONE) tablet 25 mg (25 mg Oral Given 11/09/21 1733)  ?hydrALAZINE (APRESOLINE) tablet 50 mg (50 mg Oral Given 11/09/21 1733)  ? ? ?ED Course/ Medical Decision Making/ A&P ?  ?                        ?Medical Decision Making ?Problems Addressed: ?Hypertension, unspecified type: acute illness or injury that poses a threat to life or bodily functions ? ?Amount and/or Complexity of Data Reviewed ?Labs: ordered. Decision-making details documented in ED Course. ?ECG/medicine tests: ordered and independent  interpretation performed. Decision-making details documented in ED Course. ? ?Risk ?Prescription drug management. ? ? ?Patient is 76 year old male with multiple medical history as listed above who presented the emergency department due to concern for elevated blood pressure.  Patient is not in any sign of respiratory distress.  He is on 2 L nasal cannula which is baseline.  No focal deficit on my exam.  His vital signs remarkable for blood pressure in the 160s and 170s.  He is not tachycardic.  He is not  hypoxic.  His presentation is most likely poorly managed hypertension.  Less concern for hypertensive urgency or emergency at this time.  Less concern for dissection or other serious intracranial etiology causing elevated blood pressure at this time.  Less concern for infectious etiology at this time.  CBC without leukocytosis.  His hemoglobin hematocrit are stable.  BMP without severe metabolic derangement.  Mildly low potassium at 3.4.  Creatinine is stable and does not show any AKI at this time.  His delta troponin is negative.  His EKG shows atrial fibrillation without ischemic etiology.  This is chronic for patient.  I have given him his evening dose of spironolactone and hydralazine.  His blood pressure continued to be in the 170s and 160s.  Currently does not have signs of endorgan damage.  He can be followed up with his PCP for better management of his blood pressure including adjustment of his blood pressure medication.  Strict return precaution has been discussed with patient.  Patient understand and agrees with plan. ?Final Clinical Impression(s) / ED Diagnoses ?Final diagnoses:  ?Hypertension, unspecified type  ? ? ?Rx / DC Orders ?ED Discharge Orders   ? ?      Ordered  ?  amLODipine (NORVASC) 5 MG tablet  Daily       ? 11/09/21 2043  ? ?  ?  ? ?  ? ? ?  ?Donnamarie Poag, MD ?11/09/21 2048 ? ?  ?Gareth Morgan, MD ?11/11/21 2304 ? ?

## 2021-11-09 NOTE — ED Notes (Signed)
Pt walked to bathroom & after returned to bed was given a sandwich.  ?

## 2021-11-09 NOTE — Assessment & Plan Note (Signed)
Well-controlled, A1c 6.8 on current regimen. ?

## 2021-11-09 NOTE — ED Triage Notes (Addendum)
Patient BIB [facility] from home for evaluation of hypertension, reported BP 190/110. Seen at PCP yesterday, no changes in medications. Patient denies chest pain, dizziness, and states he feels well today. Patient is alert, oriented, and in no apparnet distress at this time. ?

## 2021-11-09 NOTE — Telephone Encounter (Signed)
returned call to Dylan [name], Dylan [name] daughter, who stated tearfully that they had called EMS worried about the blood pressure - though Dylan [name] is asymptomatic, said he was fine.  SHe was concerned because his BP has been high for weeks.  I shared with her that yes, he does need additional medication, and that I am coordinating with Dr. Marisue Ivan regarding what would be the next best step, given her father's particular heart conditions.  SHe voiced understanding.  We agreed that our shared goals are to control the BP to reduce risk of stroke, heart attack, and heart failure, and that we want to do so safely with regard to his balance (hx of hypotension-related falls) and kidney function.  I encouraged her and her brother to help set up a MyChart account so that they can review medical information and message staff.  SHe appreciated the call.   ?

## 2021-11-12 ENCOUNTER — Other Ambulatory Visit: Payer: Self-pay | Admitting: Internal Medicine

## 2021-11-12 DIAGNOSIS — Z87891 Personal history of nicotine dependence: Secondary | ICD-10-CM

## 2021-11-12 DIAGNOSIS — E1122 Type 2 diabetes mellitus with diabetic chronic kidney disease: Secondary | ICD-10-CM

## 2021-11-12 MED ORDER — ALBUTEROL SULFATE HFA 108 (90 BASE) MCG/ACT IN AERS: 2.0000 | INHALATION_SPRAY | Freq: Four times a day (QID) | RESPIRATORY_TRACT | 2 refills | Status: DC | PRN

## 2021-11-17 ENCOUNTER — Other Ambulatory Visit: Payer: Self-pay | Admitting: Internal Medicine

## 2021-11-20 ENCOUNTER — Telehealth: Payer: Self-pay | Admitting: *Deleted

## 2021-11-20 NOTE — Telephone Encounter (Signed)
Tobin Chad RN from [employer] out with patient this morning.  B/P 155/91 no c/o headaches or any other problems.  Need Parameters for calling in B/P concerns.  Needs refill on Allopurinol.   On Eliquis 90 day supply is to expensive would like yo get prescriptions for 30 days-much cheaper.  Would like to get samples or change to a more affordable medication if possible.  Will send request for assistance to [facility] for the [facility].  Patient would like to get any assistance with med cost for all of his meds.  [name] can be reached at [phone] . ?

## 2021-11-21 ENCOUNTER — Encounter: Payer: Self-pay | Admitting: Internal Medicine

## 2021-11-21 ENCOUNTER — Telehealth: Payer: Self-pay

## 2021-11-21 ENCOUNTER — Other Ambulatory Visit (HOSPITAL_COMMUNITY): Payer: Self-pay

## 2021-11-21 ENCOUNTER — Other Ambulatory Visit: Payer: Self-pay | Admitting: Internal Medicine

## 2021-11-21 DIAGNOSIS — I129 Hypertensive chronic kidney disease with stage 1 through stage 4 chronic kidney disease, or unspecified chronic kidney disease: Secondary | ICD-10-CM

## 2021-11-21 MED ORDER — AMLODIPINE BESYLATE 10 MG PO TABS: 10.0000 mg | ORAL_TABLET | Freq: Every day | ORAL | 11 refills | Status: DC

## 2021-11-21 NOTE — Telephone Encounter (Signed)
Left message with [name] requesting call back regarding patient assistance options. ? ?Call back [phone] ?

## 2021-11-21 NOTE — Telephone Encounter (Signed)
Jill from [facility] called, patient has a elevated BP 165/101 and is asymptomatic call back:[phone] ?

## 2021-11-21 NOTE — Telephone Encounter (Signed)
RTC to Vivian from [employer].  Message left for Sharee Pimple to return a call to the Clinics  ?

## 2021-11-22 ENCOUNTER — Telehealth: Payer: Self-pay | Admitting: *Deleted

## 2021-11-22 NOTE — Telephone Encounter (Signed)
Call to [place] message left to call for parameters for call for with blood pressures for patient.

## 2021-11-23 ENCOUNTER — Telehealth: Payer: Self-pay | Admitting: *Deleted

## 2021-11-23 NOTE — Telephone Encounter (Signed)
Call to [name] with [place] about parameters for blood pressures.  Given information about the parameters and when to notify Physician for Systolic readings above 128 and below 110.  [name] had a question about the Diastolic reading parameters will message [name] about. Informed [name] that the Amlodipine has been increased to 10 mg daily.  Patient to take 2 of his 5 mg tablets until finished and then start the 10 mg tablets.  Patient was called and informed of the changes in his medication.  Stated that he has picked up the 10 mg tablets and has started to take them.  Patient was also informed that he can take 2 of 5 mg tablets to finish out that bottle of medication and then take the 10 mg tablets.  Patient voiced understanding of the plan and was asked to write down his blood pressure readings.  Will call patient back with follow up appointment with [name] in 1 month past his last visit.

## 2021-11-27 ENCOUNTER — Encounter: Payer: Self-pay | Admitting: Internal Medicine

## 2021-11-27 ENCOUNTER — Ambulatory Visit (INDEPENDENT_AMBULATORY_CARE_PROVIDER_SITE_OTHER): Payer: Medicare HMO | Admitting: Internal Medicine

## 2021-11-27 VITALS — BP 150/84 | HR 93 | Ht 66.0 in | Wt 160.6 lb

## 2021-11-27 DIAGNOSIS — I5032 Chronic diastolic (congestive) heart failure: Secondary | ICD-10-CM

## 2021-11-27 DIAGNOSIS — I442 Atrioventricular block, complete: Secondary | ICD-10-CM | POA: Diagnosis not present

## 2021-11-27 DIAGNOSIS — I422 Other hypertrophic cardiomyopathy: Secondary | ICD-10-CM

## 2021-11-27 DIAGNOSIS — I421 Obstructive hypertrophic cardiomyopathy: Secondary | ICD-10-CM

## 2021-11-27 NOTE — Progress Notes (Signed)
ELECTROPHYSIOLOGY CONSULT NOTE  Patient ID: Dylan [name], MRN: [id number], DOB/AGE: 78-Sep-1947 78 y.o. Admit date: (Not on file) Date of Consult: 11/27/2021  Primary Physician: Angelica Pou, MD Primary Cardiologist: DAIQUAN RESNIK is a 76 y.o. male who is being seen today for the evaluation of ICD At the request of WON .    HPI GARLAN DREWES is a 76 y.o. male referred for consideration of an ICD/pacemaker.  History of syncope.  Admitted 6/22 with recurrent syncope found to be profoundly orthostatic medications were adjusted and there has been no recurrent syncope.  Atrial fibrillation has been increasingly problematic.  Recently hospitalized, 4/23, strips were reviewed, nocturnal bradycardia again noted and daytime rates in the 80s-150s Anticoagulated with apixaban dosed for age and weight  Some dyspnea on exertion  Has been intolerant of ACE/ARB/aldosterone antagonists in the past with worsening creatinine   Event Recorder personnally reviewed     DATE TEST EF   2/19 Echo   60-65 %   6/22   Echo   50 %   9/22 PYP  Neg  9/22 cMRI  36m/12mm LGE 3%   Date Cr K Hgb  4/23 1.93 4.1 9.8  5/23 1.41 3.4 11.0      Past Medical History:  Diagnosis Date   Anemia 10/18/2014   Baseline about 12 and stable from 2010 to 2016. [place]  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 Grade 2. EF 50-55%; echo repeated 2019, severe hypertrophy with elevated filling pressures   Chronic kidney disease,  stage 3b ([facility] 03/09/2020   Dr. WJustin Mend Nephrologist, f/u Q 17M   Coronary artery disease without angina pectoris    Degloving injury of finger 03/28/2020   10/21/19: copied from op note "1.  Open reduction percutaneous pinning left small finger proximal phalanx fracture 2.  Complex repair of laceration to left small finger 3 cm in length 3.  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 No significant callus formation is noted. Pins had been removed   Diverticulosis 10/08/2014   Se[place] on CT. [place]. Freq bouts of diverticulitis.   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 Stable from 2010 to 2016.  Negative SPEP and UPEP 2014 after ARF 2/2 continued ACE (lisinopril 20) use while vol contracted. 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 Failed CPAP on study. BiPA inspiratory pressure of 21 and expiratory pressure of 17 CWP. Consider ENT evaluation for potentially correctable upper airway obstruction contributing to the need for unusually high pressures - ENT July 2015 did not feel any intervention surgically was indicated. He wore a large F&P Simplus fullfac   Personal history of colonic polyps 03/28/2014   Dr. Benson [place], 2 polyps removed 2015.  No polyps on f/u in 2020.  No further surveillance suggested per Dr. Benson [place].   Refractory obstruction of nasal  airway 04/27/2020   Chronic problem, evaluated by ENT in the past, with recommendation for surgery which she has been hesitant to consider.  He is unable to breathe through his nose comfortably.  This is part of the reason why he does not wear his oxygen regularly.  Nasal passages are nearly completely obstructed erythematous smooth glistening surface.  He has been told by his eye doctor that part of the reason his e   Resistant hypertension 10/09/2014   Poor control with 6 drug therapy. 2016 : Aldo 37 but ARR 23.5    Severe pulmonary arterial systolic hypertension ([place]) 10/18/2014   Noted as severe ECHO 2016 and 2019. Likely 2/2 severe untreated OSA. Pt is not adherent to CPAP.   Syncope 12/31/2020   Thrombocytopenia ([place]) 10/09/2016   Nl liver and spleen on [place] 2019   Ulcer aphthous oral 04/27/2020   Evaluated emergency room last week, saw with Magic mouthwash   Unintentional weight loss due to Trulicity, resolved 10/04/760      Surgical History:  Past Surgical History:  Procedure Laterality Date   BIOPSY  10/10/2021   Procedure: BIOPSY;  Surgeon: Carol Ada, MD;  Location: [facility];  Service: Gastroenterology;;   CHOLECYSTECTOMY     COLONOSCOPY WITH PROPOFOL N/A 02/12/2019   Procedure: COLONOSCOPY WITH PROPOFOL;  Surgeon: Carol Ada, MD;  Location: [facility];  Service: Endoscopy;  Laterality: N/A;   ESOPHAGOGASTRODUODENOSCOPY (EGD) WITH PROPOFOL N/A 10/10/2021   Procedure: ESOPHAGOGASTRODUODENOSCOPY (EGD) WITH PROPOFOL;  Surgeon: Carol Ada, MD;  Location: [facility];  Service: Gastroenterology;  Laterality: N/A;   PERCUTANEOUS PINNING Left 10/21/2019   Procedure: CLOSED REDUCTION WITH PERCUTANEOUS PINNING AND SUTURE REPAIR OF LACERATION  OF SMALL FINGER,  SUTURE REPAIR OF LACERATION OF INDEX FINGER;  Surgeon: Cindra Presume, MD;  Location: [facility];  Service: Plastics;  Laterality: Left;   RIGHT/LEFT HEART CATH AND CORONARY ANGIOGRAPHY N/A 08/09/2021   Procedure: RIGHT/LEFT  HEART CATH AND CORONARY ANGIOGRAPHY;  Surgeon: Troy Sine, MD;  Location: [facility];  Service: Cardiovascular;  Laterality: N/A;     Home Meds: Current Meds  Medication Sig   albuterol (VENTOLIN HFA) 108 (90 Base) MCG/ACT inhaler Inhale 2 puffs into the lungs every 6 (six) hours as needed for wheezing or shortness of breath.   allopurinol (ZYLOPRIM) 100 MG tablet Take 1 tablet (100 mg total) by mouth daily.   amLODipine (NORVASC) 10 MG tablet Take 1 tablet (10 mg total) by mouth daily.   apixaban (ELIQUIS) 5 MG TABS tablet Take 1 tablet (5 mg total) by mouth 2 (two) times daily.   atorvastatin (LIPITOR) 40 MG tablet Take 1 tablet (40 mg total) by mouth daily.   ferrous sulfate 325 (65 FE) MG tablet Take 1 tablet (325 mg total) by mouth daily with breakfast.   furosemide (LASIX) 40 MG tablet Take 1 tablet (40 mg total) by mouth daily.  hydrALAZINE (APRESOLINE) 50 MG tablet Take 1 tablet (50 mg total) by mouth 3 (three) times daily.   isosorbide mononitrate (IMDUR) 60 MG 24 hr tablet Take 1 tablet (60 mg total) by mouth daily.   latanoprost (XALATAN) 0.005 % ophthalmic solution Place 1 drop into both eyes at bedtime.   metFORMIN (GLUCOPHAGE) 500 MG tablet Take 1 tablet (500 mg total) by mouth 2 (two) times daily.   pantoprazole (PROTONIX) 40 MG tablet Take 1 tablet by mouth once daily   spironolactone (ALDACTONE) 25 MG tablet Take 1 tablet (25 mg total) by mouth 2 (two) times daily.    Allergies:  Allergies  Allergen Reactions   Ace Inhibitors Other (See Comments)    "ARF - see CRF overview"   Spironolactone Other (See Comments)    Gynecomastia per pt report    Social History   Socioeconomic History   Marital status: Divorced    Spouse name: Not on file   Number of children: 2   Years of education: Not on file   Highest education level: Not on file  Occupational History   Occupation: retired  Tobacco Use   Smoking status: Former    Packs/day: 0.50    Years:  20.00    Pack years: 10.00    Types: Cigarettes    Quit date: 10/02/1972    Years since quitting: 49.1   Smokeless tobacco: Never  Vaping Use   Vaping Use: Never used  Substance and Sexual Activity   Alcohol use: No    Alcohol/week: 0.0 standard drinks   Drug use: No   Sexual activity: Never  Other Topics Concern   Not on file  Social History Narrative   Worked in Civil engineer, 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 Had yearly occupational testing inc PFT's. Now retired. Divorced. Has male sig other. Likes to go fishing.      He starting drinking as a teen and would drink to passing out. Couldn't keep job, have family. He quit drinking and smoking cold [place] with help of his faith. No ETOH since about age 64ish      Total of 7 brothers and 6 sisters.   Social Determinants of Health   Financial Resource Strain: Medium Risk   Difficulty of Paying Living Expenses: Somewhat hard  Food Insecurity: No Food Insecurity   Worried About Charity fundraiser in the Last Year: Never true   Ran Out of Food in the Last Year: Never true  Transportation Needs: No Transportation Needs   Lack of Transportation (Medical): No   Lack of Transportation (Non-Medical): No  Physical Activity: Not on file  Stress: Not on file  Social Connections: Not on file  Intimate Partner Violence: Not on file     Family History  Problem Relation Age of Onset   Heart failure Sister        Died of complications Nov 5625   CAD Sister        CABG x3 while in her 66's     ROS:  Please see the history of present illness.     All other systems reviewed and negative.    Physical Exam:  Blood pressure (!) 150/84, pulse 93, height _0  (1.676 m), weight 160 lb 9.6 oz (72.8 kg), SpO2 92 %. General: Well developed, well nourished male in no acute distress. Head: Normocephalic, atraumatic, sclera non-icteric, no xanthomas, nares are without discharge. EENT: normal  Lymph Nodes:  none Neck: Negative for carotid bruits. JVD not  elevated. Back:without scoliosis kyphosis  Lungs: Clear bilaterally to auscultation without wheezes, rales, or rhonchi. Breathing is unlabored. Heart: Irregularly irregular rate and rhythm with S1 S2. No murmur at rest or with Valsalva. No rubs, or gallops appreciated. Abdomen: Soft, non-tender, non-distended with normoactive bowel sounds. No hepatomegaly. No rebound/guarding. No obvious abdominal masses. Msk:  Strength and tone appear normal for age. Extremities: No clubbing or cyanosis. No  edema.  Distal pedal pulses are 2+ and equal bilaterally. Skin: Warm and Dry Neuro: Alert and oriented X 3. CN III-XII intact Grossly normal sensory and motor function . Psych:  Responds to questions appropriately with a normal affect.        EKG: Atrial fibrillation at 93 Normal-/15/42 Inferior Q waves ECG 6/22 sinus rhythm with an atrial tachycardia ECG 5/22 demonstrated sinus rhythm with 2: 1 heart block following 1: 1 conduction (because of artifact I cannot tell whether the PR interval's are changing or not)  Assessment and Plan:   Hypertrophic cardiomyopathy  Left ventricular dysfunction  Atrial fibrillation-permanent with a rapid ventricular rate  Syncope-orthostatic  Hypertension  Bradycardia-nocturnal  Right bundle branch block left anterior fascicular block  2: 1 heart block (5/22) (temporally associated with use of verapamil)  Sleep disordered breathing  Anemia  Renal function intolerant of aldosterone antagonists  There are number of issues.  The first is an age issue, and I do not know if there is a cut off in terms of risk stratification for HCM as a relates to ICDs and left ventricular dysfunction.  This has been a controversial issue that his age, I will reach out to Dr. Mina Marble at [facility] to get his input on this.  First however, is the issue whether any of his cardiomyopathy is reversible.  He has atrial fibrillation with daytime rates over 100.  He has profound  nocturnal bradycardia which may limit the use of rate controlling drugs, so perhaps restoring sinus rhythm would be most valuable.  Clarifying his heart rate excursion as a potential contributor to his cardiomyopathy will also be helpful.     We will repeat echocardiogram and heart rate monitoring with a 3-day Zio patch.  His heart block occurred at night, he has sleep apnea.  I would not pursue pacing at this juncture unless control of his rates require medications aggravating his daytime bradycardia.  Evaluation for amyloid has been unrevealing.   Also discussed the importance of family screening, and will refer to Dr. Broadus John.  He has 13 siblings and 4 children.   Virl Axe

## 2021-11-27 NOTE — Patient Instructions (Signed)
Medication Instructions:  Your physician recommends that you continue on your current medications as directed. Please refer to the Current Medication list given to you today.  *If you need a refill on your cardiac medications before your next appointment, please call your pharmacy*   Lab Work: None ordered.  If you have labs (blood work) drawn today and your tests are completely normal, you will receive your results only by: [place] (if you have MyChart) OR A paper copy in the mail If you have any lab test that is abnormal or we need to change your treatment, we will call you to review the results.   Testing/Procedures: None ordered.    Follow-Up: At [facility], you and your health needs are our priority.  As part of our continuing mission to provide you with exceptional heart care, we have created designated Provider Care Teams.  These Care Teams include your primary Cardiologist (physician) and Advanced Practice Providers (APPs -  Physician Assistants and Nurse Practitioners) who all work together to provide you with the care you need, when you need it.  We recommend signing up for the patient portal called "MyChart".  Sign up information is provided on this After Visit Summary.  MyChart is used to connect with patients for Virtual Visits (Telemedicine).  Patients are able to view lab/test results, encounter notes, upcoming appointments, etc.  Non-urgent messages can be sent to your provider as well.   To learn more about what you can do with MyChart, go to [email or web address].    Your next appointment:   Follow up with Dr Caryl Comes as needed  Important Information About Sugar

## 2021-11-28 ENCOUNTER — Telehealth: Payer: Self-pay | Admitting: *Deleted

## 2021-11-28 NOTE — Telephone Encounter (Signed)
Call from [facility] patient's blood pressure today was 177/101 P-84.  Patient had taken B/P med 30 minutes before.  No other symptoms or complaints from patient.  No PT was done today as this was an assessment visit.  Patient to repeat blood pressure in 30 minutes.   Nurse visit on Friday and PT again on next Wednesday to prepare for discharge from care.

## 2021-11-28 NOTE — Telephone Encounter (Signed)
RTC to patient.  Resting has not retaken blood pressure.  Stated will do and call back with results.

## 2021-11-30 ENCOUNTER — Other Ambulatory Visit: Payer: Self-pay | Admitting: Internal Medicine

## 2021-11-30 ENCOUNTER — Other Ambulatory Visit (HOSPITAL_COMMUNITY): Payer: Self-pay

## 2021-11-30 DIAGNOSIS — I4892 Unspecified atrial flutter: Secondary | ICD-10-CM

## 2021-11-30 MED ORDER — APIXABAN 5 MG PO TABS
5.0000 mg | ORAL_TABLET | Freq: Two times a day (BID) | ORAL | 11 refills | Status: DC
Start: 2021-11-30 — End: 2022-12-26

## 2021-12-04 ENCOUNTER — Other Ambulatory Visit: Payer: Self-pay

## 2021-12-05 MED ORDER — ALLOPURINOL 100 MG PO TABS: 100.0000 mg | ORAL_TABLET | Freq: Every day | ORAL | 3 refills | Status: DC

## 2021-12-13 ENCOUNTER — Ambulatory Visit (INDEPENDENT_AMBULATORY_CARE_PROVIDER_SITE_OTHER): Payer: Medicare HMO | Admitting: Internal Medicine

## 2021-12-13 ENCOUNTER — Encounter: Payer: Self-pay | Admitting: Internal Medicine

## 2021-12-13 DIAGNOSIS — Z7984 Long term (current) use of oral hypoglycemic drugs: Secondary | ICD-10-CM

## 2021-12-13 DIAGNOSIS — I2510 Atherosclerotic heart disease of native coronary artery without angina pectoris: Secondary | ICD-10-CM

## 2021-12-13 DIAGNOSIS — M542 Cervicalgia: Secondary | ICD-10-CM

## 2021-12-13 DIAGNOSIS — G478 Other sleep disorders: Secondary | ICD-10-CM

## 2021-12-13 DIAGNOSIS — D500 Iron deficiency anemia secondary to blood loss (chronic): Secondary | ICD-10-CM

## 2021-12-13 DIAGNOSIS — M1A3790 Chronic gout due to renal impairment, unspecified ankle and foot, without tophus (tophi): Secondary | ICD-10-CM

## 2021-12-13 DIAGNOSIS — N2581 Secondary hyperparathyroidism of renal origin: Secondary | ICD-10-CM | POA: Diagnosis not present

## 2021-12-13 DIAGNOSIS — I129 Hypertensive chronic kidney disease with stage 1 through stage 4 chronic kidney disease, or unspecified chronic kidney disease: Secondary | ICD-10-CM

## 2021-12-13 DIAGNOSIS — I442 Atrioventricular block, complete: Secondary | ICD-10-CM

## 2021-12-13 DIAGNOSIS — G4733 Obstructive sleep apnea (adult) (pediatric): Secondary | ICD-10-CM | POA: Diagnosis not present

## 2021-12-13 DIAGNOSIS — K7460 Unspecified cirrhosis of liver: Secondary | ICD-10-CM

## 2021-12-13 DIAGNOSIS — K429 Umbilical hernia without obstruction or gangrene: Secondary | ICD-10-CM

## 2021-12-13 DIAGNOSIS — R188 Other ascites: Secondary | ICD-10-CM

## 2021-12-13 DIAGNOSIS — E1122 Type 2 diabetes mellitus with diabetic chronic kidney disease: Secondary | ICD-10-CM

## 2021-12-13 DIAGNOSIS — D6869 Other thrombophilia: Secondary | ICD-10-CM

## 2021-12-13 DIAGNOSIS — N62 Hypertrophy of breast: Secondary | ICD-10-CM

## 2021-12-13 DIAGNOSIS — D696 Thrombocytopenia, unspecified: Secondary | ICD-10-CM

## 2021-12-13 DIAGNOSIS — I4819 Other persistent atrial fibrillation: Secondary | ICD-10-CM

## 2021-12-13 DIAGNOSIS — I2729 Other secondary pulmonary hypertension: Secondary | ICD-10-CM

## 2021-12-13 DIAGNOSIS — N1831 Chronic kidney disease, stage 3a: Secondary | ICD-10-CM

## 2021-12-13 DIAGNOSIS — Z87891 Personal history of nicotine dependence: Secondary | ICD-10-CM

## 2021-12-13 DIAGNOSIS — E7841 Elevated Lipoprotein(a): Secondary | ICD-10-CM

## 2021-12-13 NOTE — Patient Instructions (Signed)
Mr. Spangler, Good to see you're doing so well!  Today you told me about your neck pain.  YOu have muscle stiffness and soreness from injury from your fall.  Try some muscle rubs and give it time.  (BioFreeze, IcyHot, [employer], etc).  Your belly button (umbilicus) hernia is bothering you more.  Please let me know if it gets so uncomfortable that you're ready to see a surgeon.  I'll talk to Dr. Marisue Ivan about the breast swelling and tenderness, which could be coming from your spironolactone.  For now, please continue to take medicines as prescribed.  I'll see you in 3 months!  Take care and stay well,  Dr. Rockwell Alexandria

## 2021-12-13 NOTE — Progress Notes (Incomplete)
Routine f/u of HTN, accompanied by granddaughter.  Doing more walking, feeling great, no dypsnea, palpitations, chest tightness.  Posterior neck pain and stiffness since his fall - Umbilical hernia bothering him, not yet ready for surg. Visit with Dr. Caryl [name] went well. "I don't need a pacer".   Brings list of home bps, readings are controlled after taking meds. (He had not taken all of his antihypertensives prior to this morning's appt).  BP (!) 151/81 (BP Location: Right Arm, Patient Position: Sitting, Cuff Size: Small)   Pulse 78   Temp 98.7 F (37.1 C) (Oral)   Ht _0  (1.676 m)   Wt 162 lb 9.6 oz (73.8 kg)   SpO2 97%   BMI 26.24 kg/m  Evaluated in transport chair.  Bright affect, talkative, no distress.  No JVD in sitting position, lungs clear, and no LE edema.  Heart irreg irreg (baseline AF).  Chest notable for retroareolar swelling and tenderness, has worsened lately (hx of this with spironolactone, recently increased given limited options for BP control).  Abd SNT, full in flanks, somewhat protuberant.  Navel is softly protuberant; increased abdominal pressure/valsalva causes tender bulge.  Palpable midline diastasis recti.  No incarceration, no inflammation.  Posterior neck with no tenderness over bony spine, though tender over upper paracervical muscles which are tight.  No trap tenderness. ROM mildly limited in all planes due to stiffness.  Assessment and Plan: Dylan [name] is doing very well functionally, though his BP remains uncontrolled despite recent increases in medication (increased spironolactone from 25 to 50, addition of amlodipine 10 mg daily, hydralazine 50 mg tid,  Imdur 60 mg, and lasix.  NOt on ACEI/ARB due to hx of AKI on these.  Development of gynecomastia (restarted 09/2021, increased 11/2021) impacts management.  Lasix is controlling edema (dual indication with spironolactone is cirrhosis with ascites, which is currently compensated).    Check Fe next visit (blood loss  iron deficiency), and CBC (thrombocytopenia 108 on 11/09/21, may be due to cirrhosis).  Ongoing challenges include risk factor management of his CAD and PAD (both currently asymptomatic), balanced with management of his reduced EF in hypertrophic CM with pulmonary HTN, and newly recognized ascites - without causing adverse medication events.  I'll message his heart failure doctor Dr. Marisue [name] and nephrologist Dr. Justin [name] regarding consideration of eplerenone and perhaps increase in hydralazine to better address his treatment resistant HTN (hyperaldo).  Functionally, he has more energy than he has in quite a while.  This QOL is important.  Coronary artery disease without angina pectoris No anginal symptoms.  Medical therapy continues, though BP not yet at goal; being addressed.  Secondary hyperparathyroidism of renal origin Dylan [name]) Last eval 07/2021 with Dr. Justin [name]; monitored Q 8M at Dylan [name].  Stage 3a chronic kidney disease (CKD) (Dylan [name]) 11/09/21: eGFR 51, Cr 1.41, K 3.4 (prior to increasing spironolactone).  Stable.  Biannual nephrology appts, due 01/2022.  Gout No flares in recent memory.  Continue to monitor on stable dose allopurinol.    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  Similar episode last year, evaluated with [place] and mammography (Dylan [name]), in setting of spironolactone.  May tolerate Eplerenone better, which he has been on in the past.  He does have elevated aldo:renin ratio.    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 Caryl [name]; elected no pacemaker or ICD, in shared decision making.  Hyperlipidemia Tolerating statin, recheck lipids at next visit.   Thrombocytopenia (Dylan [name])  Platelets increased from 64 to 108 from April to May 2023.  Monitor.    Resistant hypertension (hyperaldosteronism) BP remains uncontrolled despite adherence to spironolactone 50 mg daily,  losartan 100 mg daily,  amlodipine 10 mg daily, Imdur 60 mg daily, carvedilol 25 mg bid, and lasix 40 mg daily.    Cirrhosis of liver ([name]) Albumin nml, PT elevated around 20 during 10/2021 when admitted for decompensated cirrhosis with ascites and encephalopathy.  DUal indication for spironolactone and lasix with heart failure and hypertension.  Etiology not yet clear.  He has a past history of heavy alcohol use, though not recently to my knowledge.  His thrombocytopenia is likely associated.  Persistent atrial fibrillation ([name]) Rate controlled irregular heart rhythm on exam, asymptomatic.  Anticoagulated on apizaban with no complications.  Monitor.  F/u 3 m.

## 2021-12-18 ENCOUNTER — Encounter: Payer: Self-pay | Admitting: Internal Medicine

## 2021-12-18 DIAGNOSIS — I4819 Other persistent atrial fibrillation: Secondary | ICD-10-CM | POA: Insufficient documentation

## 2021-12-18 DIAGNOSIS — M542 Cervicalgia: Secondary | ICD-10-CM | POA: Insufficient documentation

## 2021-12-18 DIAGNOSIS — E785 Hyperlipidemia, unspecified: Secondary | ICD-10-CM | POA: Insufficient documentation

## 2021-12-18 DIAGNOSIS — K429 Umbilical hernia without obstruction or gangrene: Secondary | ICD-10-CM | POA: Insufficient documentation

## 2021-12-18 DIAGNOSIS — G478 Other sleep disorders: Secondary | ICD-10-CM | POA: Insufficient documentation

## 2021-12-18 DIAGNOSIS — D500 Iron deficiency anemia secondary to blood loss (chronic): Secondary | ICD-10-CM | POA: Insufficient documentation

## 2021-12-18 NOTE — Assessment & Plan Note (Signed)
Albumin nml, PT elevated around 20 during 10/2021 when admitted for decompensated cirrhosis with ascites and encephalopathy.  DUal indication for spironolactone and lasix with heart failure and hypertension.  Etiology not yet clear.  He has a past history of heavy alcohol use, though not recently to my knowledge.  His thrombocytopenia is likely associated.

## 2021-12-18 NOTE — Assessment & Plan Note (Signed)
No anginal symptoms.  Medical therapy continues, though BP not yet at goal; being addressed.

## 2021-12-18 NOTE — Assessment & Plan Note (Signed)
BP remains uncontrolled despite adherence to spironolactone 50 mg daily,  losartan 100 mg daily, amlodipine 10 mg daily, Imdur 60 mg daily, carvedilol 25 mg bid, and lasix 40 mg daily.

## 2021-12-18 NOTE — Assessment & Plan Note (Signed)
Rate controlled irregular heart rhythm on exam, asymptomatic.  Anticoagulated on apizaban with no complications.  Monitor.

## 2021-12-18 NOTE — Assessment & Plan Note (Signed)
11/09/21: eGFR 51, Cr 1.41, K 3.4 (prior to increasing spironolactone).  Stable.  Biannual nephrology appts, due 01/2022.

## 2021-12-18 NOTE — Assessment & Plan Note (Signed)
Platelets increased from 64 to 108 from April to May 2023.  Monitor.

## 2021-12-18 NOTE — Assessment & Plan Note (Signed)
Last eval 07/2021 with Dr. Justin Mend; monitored Q 30M at [facility].

## 2021-12-18 NOTE — Assessment & Plan Note (Signed)
No flares in recent memory.  Continue to monitor on stable dose allopurinol.

## 2021-12-18 NOTE — Assessment & Plan Note (Signed)
Tolerating statin, recheck lipids at next visit.

## 2021-12-18 NOTE — Assessment & Plan Note (Addendum)
Met with EPS Dr. Caryl Comes; elected no pacemaker or ICD, in shared decision making.

## 2021-12-18 NOTE — Assessment & Plan Note (Signed)
Gynecomastia, tender L>R, has worsened since resuming and increasing spironolactone.  Similar episode last year, evaluated with [place] and mammography ([name]), in setting of spironolactone.  May tolerate Eplerenone better, which he has been on in the past.  He does have elevated aldo:renin ratio.

## 2022-01-11 ENCOUNTER — Other Ambulatory Visit: Payer: Self-pay | Admitting: Cardiovascular Disease

## 2022-01-11 DIAGNOSIS — I422 Other hypertrophic cardiomyopathy: Secondary | ICD-10-CM

## 2022-02-25 ENCOUNTER — Other Ambulatory Visit: Payer: Self-pay

## 2022-02-25 DIAGNOSIS — I70219 Atherosclerosis of native arteries of extremities with intermittent claudication, unspecified extremity: Secondary | ICD-10-CM

## 2022-02-25 MED ORDER — ATORVASTATIN CALCIUM 40 MG PO TABS: 40.0000 mg | ORAL_TABLET | Freq: Every day | ORAL | 3 refills | Status: DC

## 2022-03-18 ENCOUNTER — Other Ambulatory Visit: Payer: Self-pay

## 2022-03-19 MED ORDER — HYDRALAZINE HCL 50 MG PO TABS: 50.0000 mg | ORAL_TABLET | Freq: Three times a day (TID) | ORAL | 5 refills | Status: DC

## 2022-03-19 NOTE — Telephone Encounter (Signed)
Pt called / informed of refill.

## 2022-03-19 NOTE — Telephone Encounter (Signed)
Call from pt asking about refill on Hydralazine. Informed Dr Jimmye Norman stated "I'm hoping to see him Thursday before making a decision on this refill.  If he absolutely doesn't have any left, I can do a short fill." He stated he has been calling for 2 days and he's completely out of medication. And he hopes it will be refilled today.

## 2022-03-21 ENCOUNTER — Ambulatory Visit (INDEPENDENT_AMBULATORY_CARE_PROVIDER_SITE_OTHER): Payer: Medicare HMO | Admitting: Internal Medicine

## 2022-03-21 ENCOUNTER — Encounter: Payer: Self-pay | Admitting: Internal Medicine

## 2022-03-21 ENCOUNTER — Telehealth: Payer: Self-pay | Admitting: *Deleted

## 2022-03-21 VITALS — BP 131/80 | HR 68 | Temp 98.4°F | Wt 166.4 lb

## 2022-03-21 DIAGNOSIS — I130 Hypertensive heart and chronic kidney disease with heart failure and stage 1 through stage 4 chronic kidney disease, or unspecified chronic kidney disease: Secondary | ICD-10-CM

## 2022-03-21 DIAGNOSIS — I5022 Chronic systolic (congestive) heart failure: Secondary | ICD-10-CM | POA: Diagnosis not present

## 2022-03-21 DIAGNOSIS — I10 Essential (primary) hypertension: Secondary | ICD-10-CM

## 2022-03-21 DIAGNOSIS — N62 Hypertrophy of breast: Secondary | ICD-10-CM

## 2022-03-21 DIAGNOSIS — I70213 Atherosclerosis of native arteries of extremities with intermittent claudication, bilateral legs: Secondary | ICD-10-CM | POA: Diagnosis not present

## 2022-03-21 DIAGNOSIS — E1121 Type 2 diabetes mellitus with diabetic nephropathy: Secondary | ICD-10-CM

## 2022-03-21 DIAGNOSIS — I2510 Atherosclerotic heart disease of native coronary artery without angina pectoris: Secondary | ICD-10-CM

## 2022-03-21 DIAGNOSIS — I1A0 Resistant hypertension: Secondary | ICD-10-CM

## 2022-03-21 DIAGNOSIS — N1831 Chronic kidney disease, stage 3a: Secondary | ICD-10-CM

## 2022-03-21 DIAGNOSIS — Z23 Encounter for immunization: Secondary | ICD-10-CM | POA: Diagnosis not present

## 2022-03-21 DIAGNOSIS — E7841 Elevated Lipoprotein(a): Secondary | ICD-10-CM

## 2022-03-21 DIAGNOSIS — E1122 Type 2 diabetes mellitus with diabetic chronic kidney disease: Secondary | ICD-10-CM | POA: Diagnosis not present

## 2022-03-21 DIAGNOSIS — I4819 Other persistent atrial fibrillation: Secondary | ICD-10-CM

## 2022-03-21 DIAGNOSIS — I421 Obstructive hypertrophic cardiomyopathy: Secondary | ICD-10-CM

## 2022-03-21 DIAGNOSIS — N2581 Secondary hyperparathyroidism of renal origin: Secondary | ICD-10-CM

## 2022-03-21 LAB — POCT GLYCOSYLATED HEMOGLOBIN (HGB A1C): Hemoglobin A1C: 6.6 % — AB (ref 4.0–5.6)

## 2022-03-21 LAB — GLUCOSE, CAPILLARY: Glucose-Capillary: 157 mg/dL — ABNORMAL HIGH (ref 70–99)

## 2022-03-21 MED ORDER — EPLERENONE 50 MG PO TABS: 50.0000 mg | ORAL_TABLET | Freq: Every day | ORAL | 3 refills | Status: DC

## 2022-03-21 NOTE — Assessment & Plan Note (Signed)
BMP today, as Dylan [name] has not heard from Dr. Justin Mend who he last saw 07/2021 (typically Q6M).

## 2022-03-21 NOTE — Assessment & Plan Note (Signed)
Due for lipid panel which I failed to catch today before he left.  Next visit.

## 2022-03-21 NOTE — Assessment & Plan Note (Signed)
Due for BMP today.  Typically sees Dr. Barbara Cower 24M but no appts since 07/2021.  Not on ACEI/ARB/ARNI due to renal concerns 10/2021.  Due for urine prot/cr ratio though I didn't catch that deficiency before he left today.

## 2022-03-21 NOTE — Assessment & Plan Note (Signed)
Bilateral enlargement (mild) and tenderness persist.  Will change spironolactone to eplerenone.

## 2022-03-21 NOTE — Assessment & Plan Note (Addendum)
No chest discomfort at rest or with exertion and activity tolerance has been gradually increasing.  Continue risk factor modification.  Not on antiplatelet as he takes DOAC for AF and has hx of GIB.

## 2022-03-21 NOTE — Telephone Encounter (Signed)
Heui notified to d/c spironolactone. Thanked [facility] for calling to check.

## 2022-03-21 NOTE — Assessment & Plan Note (Addendum)
Correction to last office note 12/2021- had not been taking losartan 100 mg daily, which was discontinued at time of a hospital discharge 10/2021, and was not taking a beta blocker due to bradycardia.  Home SBPs 1202-140s; 131/80 in office today. Plan to continue hydralazine 50 mg tid, amlodipine 10 mg daily.  Change spironolactone to eplerenone.  Not on ACEI/ARB/ARNI due to renal concerns during hospitalization 10/2021.  Not on beta blocker due to heart block and bradycardia.

## 2022-03-21 NOTE — Assessment & Plan Note (Signed)
Rate controlled, on DOAC, tolerated well.  Continue tx and monitor.

## 2022-03-21 NOTE — Assessment & Plan Note (Addendum)
Well compensated, asymptomatic.  No pulmonary or LE edema, no JVD.  Activity level has bee increasing.  GDMT includes  hydralazine + nitrate, spironolactone (change to eplerenone to avoid spiro gynecomastia), and BB. On lasix 40 mg daily.  Not on ACEI/ARB/ARNI due to renal concerns (stopped losartan 100 mg daily 10/2021 at time of hospital discharge).  Recheck BMP today.

## 2022-03-21 NOTE — Progress Notes (Signed)
Dylan [name] is here for routine f/u and reports he's been doing very very well - good appetite and intake, getting out and about with no dyspnea or dizziness (walked down the long hall to clinic from main entrance by himself, for the first time in a very long time), having no palpitations or chest tightness.  Breasts remain uncomfortably tender.  DM control has been doing well, and he brings a list of recorded BPs as well.    BP 131/80 (BP Location: Right Arm, Patient Position: Sitting, Cuff Size: Small)   Pulse 68   Temp 98.4 F (36.9 C) (Oral)   Wt 166 lb 6.4 oz (75.5 kg)   SpO2 100%   BMI 26.86 kg/m  Nicely dressed, bright affect, appears to have good energy. Lungs clear.  Heart irreg irreg, controlled rate.  Systolic murmur base and LLSB.  No JVD.  No LE edema.  Breasts are symmetrically and mildly enlarged with tender tissue.  No masses. Skin turgor is normal.   Assessment and plan:  Atherosclerosis 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 ([name]) No claudication; walking has increased in frequency.  FEet remain warm and well perfused despite absent dp pulses.  Continue to manage DM, HTN, lipids, and antiplatelet therapy.  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  No pulmonary or LE edema, no JVD.  Activity level has bee increasing.  GDMT includes  hydralazine + nitrate, spironolactone (change to eplerenone to avoid spiro gynecomastia), and BB. On lasix 40 mg daily.  Not on ACEI/ARB/ARNI due to renal concerns (stopped losartan 100 mg daily 10/2021 at time of hospital discharge).  Recheck BMP today.  Coronary artery disease without angina pectoris No chest discomfort at rest or with exertion and activity tolerance has been gradually increasing.  Continue risk factor modification.  Not on antiplatelet as he takes DOAC for AF and has hx of GIB.  Persistent atrial fibrillation ([name]) Rate controlled,  on DOAC, tolerated well.  Continue tx and monitor.  Resistant hypertension (hyperaldosteronism) Correction to last office note 12/2021- had not been taking losartan 100 mg daily, which was discontinued at time of a hospital discharge 10/2021, and was not taking a beta blocker due to bradycardia.  Home SBPs 1202-140s; 131/80 in office today. Plan to continue hydralazine 50 mg tid, amlodipine 10 mg daily.  Change spironolactone to eplerenone.  Not on ACEI/ARB/ARNI due to renal concerns during hospitalization 10/2021.  Not on beta blocker due to heart block and bradycardia.    DM (diabetes mellitus), type 2 with renal complications ([name]) G2B 6.6 on metformin 500 mg bid.  Blood sugar log looks excellent.  Continue current regimen.  Due for urine prot/cr ratio which I failed to catch today before he left.  Stage 3a chronic kidney disease (CKD) ([place]) Due for BMP today.  Typically sees Dr. Barbara Cower 52M but no appts since 07/2021.  Not on ACEI/ARB/ARNI due to renal concerns 10/2021.  Due for urine prot/cr ratio though I didn't catch that deficiency before he left today.    Subareolar gynecomastia in male Bilateral enlargement (mild) and tenderness persist.  Will change spironolactone to eplerenone.    Hyperlipidemia Due for lipid panel which I failed to catch today before he left.  Next visit.

## 2022-03-21 NOTE — Telephone Encounter (Signed)
Received call from [facility] at [facility]. States patient was given Rx for spironolactone 25 mg daily in July by Dr. Caryl Comes. She wants to know if this med should be stopped since Rx for Inspra was written today.

## 2022-03-21 NOTE — Patient Instructions (Addendum)
Mr. Dylan, [name] doing terrific!  I'm going to replace your spironolactone with a medicine called eplerenone.  I'll have to call you with the exact dose.  In the meantime, keep taking everything as is.  Let's get together in 3 months!  [name]

## 2022-03-21 NOTE — Assessment & Plan Note (Signed)
No claudication; walking has increased in frequency.  FEet remain warm and well perfused despite absent dp pulses.  Continue to manage DM, HTN, lipids, and antiplatelet therapy.

## 2022-03-21 NOTE — Assessment & Plan Note (Signed)
A1c 6.6 on metformin 500 mg bid.  Blood sugar log looks excellent.  Continue current regimen.  Due for urine prot/cr ratio which I failed to catch today before he left.

## 2022-03-22 LAB — BMP8+ANION GAP
Anion Gap: 18 mmol/L (ref 10.0–18.0)
BUN/Creatinine Ratio: 25 — ABNORMAL HIGH (ref 10–24)
BUN: 39 mg/dL — ABNORMAL HIGH (ref 8–27)
CO2: 18 mmol/L — ABNORMAL LOW (ref 20–29)
Calcium: 10.2 mg/dL (ref 8.6–10.2)
Chloride: 101 mmol/L (ref 96–106)
Creatinine, Ser: 1.57 mg/dL — ABNORMAL HIGH (ref 0.76–1.27)
Glucose: 131 mg/dL — ABNORMAL HIGH (ref 70–99)
Potassium: 4.1 mmol/L (ref 3.5–5.2)
Sodium: 137 mmol/L (ref 134–144)
eGFR: 45 mL/min/{1.73_m2} — ABNORMAL LOW (ref >59)

## 2022-05-06 ENCOUNTER — Telehealth: Payer: Self-pay | Admitting: Cardiovascular Disease

## 2022-05-06 NOTE — Telephone Encounter (Signed)
Called pt due to getting return mail, updated address and also made next appt for pt.

## 2022-06-20 ENCOUNTER — Ambulatory Visit (INDEPENDENT_AMBULATORY_CARE_PROVIDER_SITE_OTHER): Payer: Medicare HMO | Admitting: Internal Medicine

## 2022-06-20 VITALS — BP 150/83 | HR 75 | Temp 98.0°F | Ht 66.0 in | Wt 158.8 lb

## 2022-06-20 DIAGNOSIS — I421 Obstructive hypertrophic cardiomyopathy: Secondary | ICD-10-CM

## 2022-06-20 DIAGNOSIS — I5022 Chronic systolic (congestive) heart failure: Secondary | ICD-10-CM

## 2022-06-20 DIAGNOSIS — I4819 Other persistent atrial fibrillation: Secondary | ICD-10-CM

## 2022-06-20 DIAGNOSIS — E1121 Type 2 diabetes mellitus with diabetic nephropathy: Secondary | ICD-10-CM

## 2022-06-20 DIAGNOSIS — N1831 Chronic kidney disease, stage 3a: Secondary | ICD-10-CM | POA: Diagnosis not present

## 2022-06-20 DIAGNOSIS — E7841 Elevated Lipoprotein(a): Secondary | ICD-10-CM

## 2022-06-20 DIAGNOSIS — I1A0 Resistant hypertension: Secondary | ICD-10-CM

## 2022-06-20 DIAGNOSIS — R1032 Left lower quadrant pain: Secondary | ICD-10-CM | POA: Insufficient documentation

## 2022-06-20 DIAGNOSIS — K7460 Unspecified cirrhosis of liver: Secondary | ICD-10-CM | POA: Diagnosis not present

## 2022-06-20 DIAGNOSIS — E1122 Type 2 diabetes mellitus with diabetic chronic kidney disease: Secondary | ICD-10-CM

## 2022-06-20 DIAGNOSIS — R0989 Other specified symptoms and signs involving the circulatory and respiratory systems: Secondary | ICD-10-CM | POA: Insufficient documentation

## 2022-06-20 DIAGNOSIS — I2510 Atherosclerotic heart disease of native coronary artery without angina pectoris: Secondary | ICD-10-CM

## 2022-06-20 DIAGNOSIS — Z7984 Long term (current) use of oral hypoglycemic drugs: Secondary | ICD-10-CM

## 2022-06-20 DIAGNOSIS — Z87891 Personal history of nicotine dependence: Secondary | ICD-10-CM

## 2022-06-20 DIAGNOSIS — R197 Diarrhea, unspecified: Secondary | ICD-10-CM | POA: Insufficient documentation

## 2022-06-20 DIAGNOSIS — N62 Hypertrophy of breast: Secondary | ICD-10-CM

## 2022-06-20 LAB — POCT GLYCOSYLATED HEMOGLOBIN (HGB A1C): Hemoglobin A1C: 6.1 % — AB (ref 4.0–5.6)

## 2022-06-20 LAB — GLUCOSE, CAPILLARY: Glucose-Capillary: 112 mg/dL — ABNORMAL HIGH (ref 70–99)

## 2022-06-20 NOTE — Assessment & Plan Note (Signed)
Occurred about 4 weeks ago with multiple frequent watery stools daily, not accompanied by abdominal discomfort, nausea, vomiting, loss of appetite, or rectal bleeding.  Advised to stop the daily MiraLAX before working up further.  He has not felt ill. Eating well, good appetite, not trying to lose weight, but is down a few pounds since last visit. Further details in today's note.  Monitor.

## 2022-06-20 NOTE — Assessment & Plan Note (Signed)
Large uvula extends to base of tongue and probably causes a globus sensation-the constant attempt to clear his throat could contribute to increased mucus production.  Recommended trial of OTC nonsedating antihistamine such as Claritin or Allegra.  If the clear thin rhinorrhea does not respond to this, a trial of topical steroid or Atrovent spray might be beneficial.

## 2022-06-20 NOTE — Assessment & Plan Note (Signed)
CMP to f/u on liver; he feels that his abdomen may be a bit more swollen than usual and there is possible mild ascites on exam.  No change in epleronone and no lasix needed currently.

## 2022-06-20 NOTE — Assessment & Plan Note (Addendum)
Home SBPs 130-140s; 150/83 (this is the lower 2nd reading) in office today. He had just recently taken medicines.  At last visit changed spiro to epleronone.  Plan to continue hydralazine 50 mg tid, amlodipine 10 mg daily.  He is already volume depleted so I hesitate to increase epleronone which could worsen renal.  Few options.  Co managed with cardiology.  Bmp today.

## 2022-06-20 NOTE — Assessment & Plan Note (Signed)
All tenderness has resolved.  Will resolve problem.

## 2022-06-20 NOTE — Assessment & Plan Note (Signed)
Lungs are clear, no LE edema whatsoever, in fact clinically volume depleted.  Able to walk community distances comfortably.  No orthopnea. I note that he doesn't have Imdur with him - he's not sure if he takes it.  It remains on his list.  ???

## 2022-06-20 NOTE — Progress Notes (Signed)
76 year old Dylan [name] is here for routine 12-monthfollow-up of his chronic conditions including T2DM, CKD, cirrhosis, heart failure, resistant hypertension-among other problems-reporting that he is doing ok, still walking, no dizziness or unsteadiness, no falls, and fortunately experiencing completely resolved breast tenderness (spironolactone cessation).  He endorses the following conditions to discuss-  1)  Reports about a month of diarrhea, during which his blood sugars dropped more than usual, as low as 90s fasting.  Was concerned, eating sweets to keep it above 120, because he didn't know it was safe to go lower.  Diarrhea is better, but still using bathroom 5-6 hrs a day (initially very watery and very frequent, beginning to increase in consistency).  Never felt sick, no nausea, no pain in abd, but just very recently has had L low back pain radiating around to L pelvic area.  Hasn't tried imodium or other OTC.  He continued his daily MiraLAX during the first several days of his diarrhea ("I was told never to stop taking it or else I could have that diverticulosis bleed again"), and stopped to see if the diarrhea would improve (it completely resolved with no BMS at all for 2 days, after which he thought he was constipated and resumed it)-not surprisingly, the diarrhea resumed.  When asked, he feels that his abdomen may be a bit "puffed up" compared to the flatness in the past - no LE edema.    2)  Has phlegm in throat, trying to clear it, (he clarifies it is not coughing).  No reflux.  Sometimes feels the air moving through his "pipes kind of rough like".  3)  "I have a lot of problem with allergies".  Recently "got really bad" -constantly blowing nose which drips "water"; took allergy meds (Benadryl! NO!) which did not help.   Meds reviewed as brought with him.  Missing Imdur.  Taking? He's not sure.  No chest pain.    BP (!) 150/83 (BP Location: Right Arm, Patient Position: Sitting, Cuff Size:  Small)   Pulse 75   Temp 98 F (36.7 C) (Oral)   Ht 5' 6" (1.676 m)   Wt 158 lb 12.8 oz (72 kg)   SpO2 98%   BMI 25.63 kg/m  Bright affect, appears well.,  Enjoyable in conversation.  In sitting position on exam table, no JVD, no basilar rales, lungs are clear though with forced exhalation and forced cough some congestion/rhonchi are audible over the proximal airways.  Heart RRR with extra sound vs split S2. Oropharynx reveals posterior cobblestoning and a very large uvula which dangles down to the base of the tongue.  No tenderness of spine, paraspinal region, or L CVA on palpation and percussion.  Lying on exam table, the abdomen is not as flat as it has been in the past, though does not appear to be particularly distended.  Bowel sounds are normal throughout.  No abnormal percussion notes.  Flanks are dull to percussion.  No shifting dullness and no succession splash on my exam.  Very minimal discomfort elicited with deep palpation in the LLQ, near the left bladder margin.  No masses palpated.  No lower extremity edema.  Ankles in fact are very slim and skin turgor is reduced throughout (he admits to trying not to drink excessive fluids his heart failure diagnosis).  Minimal assist needed to get on and off the exam table.  Assessment and plan (not in order of importance)  Subareolar gynecomastia in male All tenderness has resolved upon cessation of  spironolactone.  Tolerating eplerenone.  Will resolve problem.    Resistant hypertension (hyperaldosteronism) Home SBPs 130-140s; 150/83 (this is the lower 2nd reading) in office today. He had just recently taken medicines.  At last visit changed spiro to epleronone.  Plan to continue hydralazine 50 mg tid, amlodipine 10 mg daily.  He is already volume depleted so I hesitate to increase epleronone which could worsen renal.  Avoiding ACEI/ARB/ARNI due to prior renal impairment with these meds. Few options.  Co managed with cardiology, next visit 08/2021.   Bmp today.  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  Able to walk community distances comfortably.  No orthopnea. I note that he doesn't have Imdur with him - he's not sure if he takes it.  It remains on his list.  ???  Coronary artery disease without angina pectoris No angina with exertion or rest.  On DOAC for AF but not antiplatelet (hx of diverticular hemorrhage, hx of falls).  Persistent atrial fibrillation ([name]) Rate controlled without AVN blocker, on DOAC, tolerated well.  Continue tx and monitor.   Cirrhosis of liver ([name]) CMP to f/u on liver; he feels that his abdomen may be a bit more swollen than usual and there is possible mild ascites on exam.  No change in epleronone and no lasix needed currently.    Stage 3a chronic kidney disease (CKD) ([name]) eGFR 45 03/2022, recheck today.  He has lost volume during diarrheal illness.  On no ACEI/ARB/ARNI due to renal concerns in the past. Urine ACR today.   Hyperlipidemia Due for lipid panel today to assess statin efficacy. CMP check liver tests today given hx cirrhosis.  Acute left lower quadrant pain Minor discomfort, very minimal reproducible symptoms with deep palpation in the left lower quadrant.  No rectal bleeding and the symptoms occurred after about 4 weeks of diarrhea.  No signs or symptoms of clinical diverticulitis.  Monitor.  Diarrhea Occurred about 4 weeks ago with multiple frequent watery stools daily, not accompanied by abdominal discomfort, nausea, vomiting, loss of appetite, or rectal bleeding.  Advised to stop the daily MiraLAX before working up further.  He has not felt ill. Eating well, good appetite, not trying to lose weight, but is down a few pounds since last visit. Further details in today's note.  Monitor.  Phlegm in throat Large uvula extends to base of tongue and probably causes a globus  sensation-the constant attempt to clear his throat could contribute to increased mucus production.  Recommended trial of OTC nonsedating antihistamine such as Claritin or Allegra.  If the clear thin rhinorrhea does not respond to this, a trial of topical steroid or Atrovent spray might be beneficial.

## 2022-06-20 NOTE — Patient Instructions (Addendum)
Mr. Kelsen, [place] to see you!  We talked about your diarrhea which is probably leading to weight loss and some mild dehydration.  I want you to stop the miralax.  I know you're afraid to get constipated.  If it would make you feel more comfortable, you could try 1/2 a dose a day.  In general, the miralax will probably prevent your stools from firming up.  The feeling of material collecting in your throat, and the thick secretions you're wiping out of your mouth, could be a combination of things.  Your uvula is very long and tickles the base of your tongue, which feels like there is something there.  More mucus is produced when you try to clear your throat, which makes it seem worse.  Let's try to break this cycle with an antihistamine/allergy pill like Claritin or Allegra.  You can get this over the counter.  At this point, there isn't any diverticulitis.  I know you'll keep an eye on this!  I'll call you with your test results, which are looking at your diabetes, liver, and kidney function.  See you in 3 months, if not before!  Take care and stay well,  [name]

## 2022-06-20 NOTE — Assessment & Plan Note (Signed)
Due for lipid panel today to assess statin efficacy. CMP check liver tests today given hx cirrhosis.

## 2022-06-20 NOTE — Assessment & Plan Note (Signed)
Rate controlled without AVN blocker, on DOAC, tolerated well.  Continue tx and monitor.

## 2022-06-20 NOTE — Assessment & Plan Note (Signed)
eGFR 45 03/2022, recheck today.  He has lost volume during diarrheal illness.  On no ACEI/ARB/ARNI due to renal concerns in the past. Urine ACR today.

## 2022-06-20 NOTE — Assessment & Plan Note (Signed)
Minor discomfort, very minimal reproducible symptoms with deep palpation in the left lower quadrant.  No rectal bleeding and the symptoms occurred after about 4 weeks of diarrhea.  No signs or symptoms of clinical diverticulitis.  Monitor.

## 2022-06-20 NOTE — Assessment & Plan Note (Signed)
No angina with exertion or rest.  On DOAC for AF but not antiplatelet (hx of diverticular hemorrhage, hx of falls).

## 2022-06-21 LAB — CMP14 + ANION GAP
ALT: 12 IU/L (ref 0–44)
AST: 17 IU/L (ref 0–40)
Albumin/Globulin Ratio: 1.6 (ref 1.2–2.2)
Albumin: 4.8 g/dL (ref 3.8–4.8)
Alkaline Phosphatase: 60 IU/L (ref 44–121)
Anion Gap: 17 mmol/L (ref 10.0–18.0)
BUN/Creatinine Ratio: 15 (ref 10–24)
BUN: 22 mg/dL (ref 8–27)
Bilirubin Total: 0.6 mg/dL (ref 0.0–1.2)
CO2: 23 mmol/L (ref 20–29)
Calcium: 10 mg/dL (ref 8.6–10.2)
Chloride: 104 mmol/L (ref 96–106)
Creatinine, Ser: 1.47 mg/dL — ABNORMAL HIGH (ref 0.76–1.27)
Globulin, Total: 3 g/dL (ref 1.5–4.5)
Glucose: 112 mg/dL — ABNORMAL HIGH (ref 70–99)
Potassium: 3.5 mmol/L (ref 3.5–5.2)
Sodium: 144 mmol/L (ref 134–144)
Total Protein: 7.8 g/dL (ref 6.0–8.5)
eGFR: 49 mL/min/{1.73_m2} — ABNORMAL LOW (ref >59)

## 2022-06-21 LAB — MICROALBUMIN / CREATININE URINE RATIO
Creatinine, Urine: 92.8 mg/dL
Microalb/Creat Ratio: 452 mg/g creat — ABNORMAL HIGH (ref 0–29)
Microalbumin, Urine: 419.1 ug/mL

## 2022-06-21 LAB — CHOLESTEROL, TOTAL: Cholesterol, Total: 117 mg/dL (ref 100–199)

## 2022-07-12 NOTE — Progress Notes (Signed)
Called and spoke to Dylan [name] to share results.  Diarrhea better.  Still takes miralax 1/2 dose daily to maintain loose stools per his GI doc's instructions.  He feels terrific, walking twice a day.  No change in plan.

## 2022-07-18 ENCOUNTER — Encounter: Payer: Medicare HMO | Admitting: Internal Medicine

## 2022-08-27 NOTE — Progress Notes (Signed)
Cardiology Office Note:   Date:  08/30/2022  NAME:  Dylan [name]    MRN: [id number] DOB:  Feb 18, 1946   PCP:  Angelica Pou, MD  Cardiologist:  Evalina Field, MD  Electrophysiologist:  None   Referring MD: Angelica Pou, MD   Chief Complaint  Patient presents with   Follow-up   History of Present Illness:   Dylan [name] is a 77 y.o. male with a hx of HCM, CHF, CAD, pAF, CHB, pHTN, Cirrhosis who presents for follow-up. He reports he is doing well.  Blood pressure a bit elevated but had a salty meal.  Tells me his values are well-controlled at home.  Denies any dizziness or lightheadedness.  No symptoms of chest pain or trouble breathing.  He is walking 1/4 mile.  He has been cleared for any eye surgery he needs.  His eye surgery continues to tell him he is not clear from a cardiac standpoint.  From my standpoint he may proceed to any procedure he likes.  His bradycardia was nocturnal.  He is without congestive heart failure symptoms.  His weights are stable.  He is optimized.  Problem List  Hypertrophic cardiomyopathy/Systolic HF -EF A999333 echo A999333 -EF 25% on CMR (arrhythmia artifact?) 03/2021 -EF 30-35% 07/2021 -basal septum 21 mm -3% LGE 2. CAD -45% mLAD -85% D2 (08/09/2021) 3. Paroxysmal Afib/flutter 4. CHB detected on monitor -nocturnal 02/18/2021 @ 4:02 AM 5. Diabetes -A1c 7.5 6. HTN 7. CKD 3 8. Prior tobacco abuse -20 pack years  9. Pulmonary hypertension 10. OSA 11. Cirrhosis   Past Medical History: Past Medical History:  Diagnosis Date   Anemia 10/18/2014   Baseline about 12 and stable from 2010 to 2016. Colon 2009 in [place] (records cannot be obtained).  EGD Dr Benson Norway 2011 nl    Aortic atherosclerosis ([place]) 03/28/2020   Incidental finding on imaging CT    Atherosclerosis 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 ([place]) 12/28/2014   ABI Feb 2017 R 0.49; L 0.72 with diffuse dz ABI Aug 2017 R 0.49; L 0.69 ABI Jan 2018 R 0.61; L 0.73  ABI  Feb 2019 R 0.55; L 0.71 Sees Dr Bridgett Larsson - recs ABI q 6 months   Chronic diastolic 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 ([facility]) 10/18/2014   Noted ECHO 10/2014. Grade 2. EF 50-55%; echo repeated 2019, severe hypertrophy with elevated filling pressures   Chronic kidney disease, stage 3b ([place]) 03/09/2020   Dr. Justin Mend  Nephrologist, f/u Q 44M   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 ([place]) 10/18/2014   Noted ECHO 10/2014. Grade 2. EF 50-55%; echo repeated 2019, severe hypertrophy with elevated filling pressures  Repeat ECHO 08/04/21 showed EF 30-35%, severely elevated PASP, severely reduced RVSF, severely dilated LA and RA, small pericardial effusion     Coronary artery disease without angina pectoris    Degloving injury of finger 03/28/2020   10/21/19: copied from op note "1.  Open reduction percutaneous pinning left small finger proximal phalanx fracture 2.  Complex repair of laceration to left small finger 3 cm in length 3.  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 No significant callus formation is noted. Pins had been removed   Diverticulosis 10/08/2014   Seen on CT. Reportedly on Colon in [place] in 2009. Freq bouts of diverticulitis.   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 Stable from 2010 to 2016.  Negative SPEP and UPEP 2014 after ARF 2/2 continued ACE (lisinopril 20) use while vol contracted. 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 Failed CPAP on study. BiPA inspiratory pressure of 21 and expiratory pressure of 17 CWP. Consider ENT evaluation for potentially correctable upper airway obstruction contributing to the need for unusually high pressures - ENT July 2015 did not feel any intervention surgically was indicated. He wore a large F&P Simplus fullfac   Personal history of colonic polyps 03/28/2014   Dr. Benson Norway, 2 polyps removed 2015.  No polyps on f/u in 2020.  No further surveillance suggested per Dr. Benson Norway.   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  He is unable to breathe through his nose comfortably.  This is part of the reason why he does not wear his oxygen regularly.  Nasal passages are nearly completely obstructed erythematous smooth glistening surface.  He has been told by his eye doctor that part of the reason his e   Resistant hypertension 10/09/2014   Poor control with 6 drug therapy. 2016 : Aldo 37 but ARR 23.5    Severe pulmonary arterial systolic hypertension ([place]) 10/18/2014   Noted as severe ECHO 2016 and 2019. Likely 2/2 severe untreated OSA. Pt is not adherent to CPAP.   Syncope 12/31/2020   Thrombocytopenia ([place]) 10/09/2016   Nl liver and spleen on [place] 2019   Ulcer aphthous oral 04/27/2020   Evaluated emergency room last week, saw with Magic mouthwash   Unintentional weight loss due to Trulicity, resolved [id number]    Past Surgical History: Past Surgical History:  Procedure Laterality Date   BIOPSY  10/10/2021   Procedure: BIOPSY;  Surgeon: Carol Ada, MD;  Location: [facility];  Service: Gastroenterology;;   CHOLECYSTECTOMY     COLONOSCOPY WITH PROPOFOL N/A 02/12/2019   Procedure: COLONOSCOPY  WITH PROPOFOL;  Surgeon: Carol Ada, MD;  Location: [facility];  Service: Endoscopy;  Laterality: N/A;   ESOPHAGOGASTRODUODENOSCOPY (EGD) WITH PROPOFOL N/A 10/10/2021   Procedure: ESOPHAGOGASTRODUODENOSCOPY (EGD) WITH PROPOFOL;  Surgeon: Carol Ada, MD;  Location: [facility];  Service: Gastroenterology;  Laterality: N/A;   PERCUTANEOUS PINNING Left 10/21/2019   Procedure: CLOSED REDUCTION WITH PERCUTANEOUS PINNING AND SUTURE REPAIR OF LACERATION  OF SMALL FINGER,  SUTURE REPAIR OF LACERATION OF INDEX FINGER;  Surgeon: Cindra Presume, MD;  Location: [facility];  Service: Plastics;  Laterality: Left;   RIGHT/LEFT HEART CATH AND CORONARY ANGIOGRAPHY N/A 08/09/2021   Procedure: RIGHT/LEFT HEART CATH AND CORONARY ANGIOGRAPHY;  Surgeon: Troy Sine, MD;  Location: [facility];  Service: Cardiovascular;  Laterality: N/A;    Current Medications: Current Meds  Medication Sig   allopurinol (ZYLOPRIM) 100 MG tablet Take 1 tablet (100 mg total) by mouth daily.   amLODipine (NORVASC) 10 MG tablet Take  1 tablet (10 mg total) by mouth daily.   apixaban (ELIQUIS) 5 MG TABS tablet Take 1 tablet (5 mg total) by mouth 2 (two) times daily.   atorvastatin (LIPITOR) 40 MG tablet Take 1 tablet (40 mg total) by mouth daily.   eplerenone (INSPRA) 50 MG tablet Take 1 tablet (50 mg total) by mouth daily.   FREESTYLE LITE test strip 3 (three) times daily.   furosemide (LASIX) 40 MG tablet Take 1 tablet (40 mg total) by mouth daily.   hydrALAZINE (APRESOLINE) 50 MG tablet Take 1 tablet (50 mg total) by mouth 3 (three) times daily.   isosorbide mononitrate (IMDUR) 60 MG 24 hr tablet Take 1 tablet (60 mg total) by mouth daily.   latanoprost (XALATAN) 0.005 % ophthalmic solution Place 1 drop into both eyes at bedtime.   metFORMIN (GLUCOPHAGE) 500 MG tablet Take 1 tablet (500 mg total) by mouth 2 (two) times daily.   ofloxacin (OCUFLOX) 0.3 % ophthalmic solution      Allergies:    Ace inhibitors and  Spironolactone   Social History: Social History   Socioeconomic History   Marital status: Divorced    Spouse name: Not on file   Number of children: 2   Years of education: Not on file   Highest education level: Not on file  Occupational History   Occupation: retired  Tobacco Use   Smoking status: Former    Packs/day: 0.50    Years: 20.00    Total pack years: 10.00    Types: Cigarettes    Quit date: 10/02/1972    Years since quitting: 49.9   Smokeless tobacco: Never  Vaping Use   Vaping Use: Never used  Substance and Sexual Activity   Alcohol use: No    Alcohol/week: 0.0 standard drinks of alcohol   Drug use: No   Sexual activity: Never  Other Topics Concern   Not on file  Social History Narrative   Worked in Civil engineer, 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 Had yearly occupational testing inc PFT's. Now retired. Divorced. Has male sig other. Likes to go fishing.      He starting drinking as a teen and would drink to passing out. Couldn't keep job, have family. He quit drinking and smoking cold [place] with help of his faith. No ETOH since about age 25ish      Total of 7 brothers and 6 sisters.   Social Determinants of Health   Financial Resource Strain: Medium Risk (08/06/2021)   Overall Financial Resource Strain (CARDIA)    Difficulty of Paying Living Expenses: Somewhat hard  Food Insecurity: No Food Insecurity (08/06/2021)   Hunger Vital Sign    Worried About Running Out of Food in the Last Year: Never true    Ran Out of Food in the Last Year: Never true  Transportation Needs: No Transportation Needs (08/06/2021)   PRAPARE - Hydrologist (Medical): No    Lack of Transportation (Non-Medical): No  Physical Activity: Unknown (04/04/2018)   Exercise Vital Sign    Days of Exercise per Week: 0 days    Minutes of Exercise per Session: Not on file  Stress: No Stress Concern Present (04/04/2018)   [place]    Feeling of Stress : Only a little  Social Connections: Moderately Integrated (04/04/2018)   Social Connection and Isolation Panel [NHANES]    Frequency of Communication with Friends and Family: More than three times a week  Frequency of Social Gatherings with Friends and Family: More than three times a week    Attends Religious Services: More than 4 times per year    Active Member of [employer] or Organizations: Yes    Attends Music therapist: More than 4 times per year    Marital Status: Divorced     Family History: The patient's family history includes CAD in his sister; Heart failure in his sister.  ROS:   All other ROS reviewed and negative. Pertinent positives noted in the HPI.     EKGs/Labs/Other Studies Reviewed:   The following studies were personally reviewed by me today:  TTE 08/05/2021  1. Left ventricular ejection fraction, by estimation, is 30 to 35%. The  left ventricle has moderately decreased function. The left ventricle has  no regional wall motion abnormalities. Left ventricular diastolic  parameters are indeterminate. There is the   interventricular septum is flattened in systole and diastole, consistent  with right ventricular pressure and volume overload.   2. Right ventricular systolic function is severely reduced. The right  ventricular size is moderately enlarged. There is severely elevated  pulmonary artery systolic pressure. The estimated right ventricular  systolic pressure is [id number] mmHg.   3. Left atrial size was severely dilated.   4. Right atrial size was severely dilated.   5. A small pericardial effusion is present. The pericardial effusion is  circumferential.   6. The mitral valve is normal in structure. Mild mitral valve  regurgitation. No evidence of mitral stenosis.   7. Tricuspid valve regurgitation is severe.   8. The aortic valve is normal in structure. There is moderate  calcification of the aortic valve. There  is mild thickening of the aortic  valve. Aortic valve regurgitation is not visualized. Aortic valve  sclerosis/calcification is present, without any  evidence of aortic stenosis.   9. The inferior vena cava is dilated in size with <50% respiratory  variability, suggesting right atrial pressure of 15 mmHg.   LHC/RHC 08/09/2021 Mid LAD lesion is 45% stenosed.   2nd Diag lesion is 85% stenosed.   Single-vessel coronary obstructive disease with 45% mid LAD stenosis in the region of the takeoff of a small second diagonal vessel which had 85% ostial/proximal stenosis.   Normal left circumflex and RCA.   Moderate pulmonary hypertension with mean PA at 36 mm.   PVR: 6.0 WU  Recent Labs: 10/09/2021: B Natriuretic Peptide 2,572.9 10/16/2021: Magnesium 2.1 11/09/2021: Hemoglobin 11.0; Platelets 108 06/20/2022: ALT 12; BUN 22; Creatinine, Ser 1.47; Potassium 3.5; Sodium 144   Recent Lipid Panel    Component Value Date/Time   CHOL 117 06/20/2022 1139   TRIG 57 09/14/2020 1005   HDL 38 (L) 09/14/2020 1005   CHOLHDL 3.3 09/14/2020 1005   CHOLHDL 4.6 01/12/2015 1032   VLDL 17 01/12/2015 1032   LDLCALC 75 09/14/2020 1005    Physical Exam:   VS:  BP (!) 154/96   Pulse 97   Ht '5\' 6"'$  (1.676 m)   Wt 166 lb 3.2 oz (75.4 kg)   SpO2 97%   BMI 26.83 kg/m    Wt Readings from Last 3 Encounters:  08/30/22 166 lb 3.2 oz (75.4 kg)  06/20/22 158 lb 12.8 oz (72 kg)  03/21/22 166 lb 6.4 oz (75.5 kg)    General: Well nourished, well developed, in no acute distress Head: Atraumatic, normal size  Eyes: PEERLA, EOMI  Neck: Supple, no JVD Endocrine: No thryomegaly Cardiac: Normal S1, S2; irregular rhythm,  no murmurs rubs or gallops Lungs: Clear to auscultation bilaterally, no wheezing, rhonchi or rales  Abd: Soft, nontender, no hepatomegaly  Ext: No edema, pulses 2+ Musculoskeletal: No deformities, BUE and BLE strength normal and equal Skin: Warm and dry, no rashes   Neuro: Alert and oriented to  person, place, time, and situation, CNII-XII grossly intact, no focal deficits  Psych: Normal mood and affect   ASSESSMENT:   Dylan [name] is a 77 y.o. male who presents for the following: 1. Preoperative cardiovascular examination   2. CHB (complete heart block) ([name])   3. Hypertrophic obstructive cardiomyopathy ([place] [place])   4. Chronic systolic heart failure ([facility])   5. Primary hypertension   6. Orthostatic hypotension     PLAN:   1. Preoperative cardiovascular examination -He needs eye surgery.  Congestive heart failure stable.  No symptoms of angina.  He may proceed to surgery at acceptable risk.  We have told the patient this for over a year.  I have reached out to his surgeon directly.  I see no need to withhold any surgeries given his lack of symptoms and ability to walk a quarter of a mile.  2. CHB (complete heart block) ([name]) -Nocturnal.  Hold beta-blocker.  Heart rate is controlled on no medication.  Plan was for outpatient monitor but his approach will likely be palliative.  We will just continue with holding AV nodal agents.  3. Hypertrophic obstructive cardiomyopathy ([place]) 4. Chronic systolic heart failure ([name]) -Hypertrophic cardiomyopathy.  Ejection fraction 30-35%.  Evaluated by EP.  They have recommended against ICD.  His complete heart block was nocturnal.  Given his advanced age and lack of significant LGE would recommend a conservative approach.  He has had no syncope.  He seems to be doing well.  Denies any symptoms of congestive heart failure.  Will continue amlodipine 10 mg daily, Imdur 60 mg daily, hydralazine 50 mg 3 times daily.  He is also on eplerenone 50 mg daily.  Will continue current regimen.  No beta-blocker due to nocturnal complete heart block.  5. Primary hypertension -Continue current regimen.  6. Orthostatic hypotension -Stable.  7.  Persistent atrial fibrillation -On Eliquis.  Holding AV nodal agents.  No symptoms.  Continue with conservative  approach  Disposition: Return in about 6 months (around 02/28/2023).  Medication Adjustments/Labs and Tests Ordered: Current medicines are reviewed at length with the patient today.  Concerns regarding medicines are outlined above.  No orders of the defined types were placed in this encounter.  No orders of the defined types were placed in this encounter.   Patient Instructions  Medication Instructions:  The current medical regimen is effective;  continue present plan and medications.  *If you need a refill on your cardiac medications before your next appointment, please call your pharmacy*   Follow-Up: At [facility], you and your health needs are our priority.  As part of our continuing mission to provide you with exceptional heart care, we have created designated Provider Care Teams.  These Care Teams include your primary Cardiologist (physician) and Advanced Practice Providers (APPs -  Physician Assistants and Nurse Practitioners) who all work together to provide you with the care you need, when you need it.  We recommend signing up for the patient portal called "MyChart".  Sign up information is provided on this After Visit Summary.  MyChart is used to connect with patients for Virtual Visits (Telemedicine).  Patients are able to view lab/test results, encounter notes, upcoming appointments,  etc.  Non-urgent messages can be sent to your provider as well.   To learn more about what you can do with MyChart, go to [email or web address].    Your next appointment:   6 month(s)  Provider:   Evalina Field, MD       Time Spent with Patient: I have spent a total of 25 minutes with patient reviewing hospital notes, telemetry, EKGs, labs and examining the patient as well as establishing an assessment and plan that was discussed with the patient.  > 50% of time was spent in direct patient care.  Signed, Addison Naegeli. Audie Box, MD, [place]  [address], [place], [place] [address] [phone]  08/30/2022 8:43 PM

## 2022-08-30 ENCOUNTER — Encounter: Payer: Self-pay | Admitting: Cardiovascular Disease

## 2022-08-30 ENCOUNTER — Ambulatory Visit: Payer: Medicare HMO | Attending: Cardiovascular Disease | Admitting: Cardiovascular Disease

## 2022-08-30 VITALS — BP 154/96 | HR 97 | Ht 66.0 in | Wt 166.2 lb

## 2022-08-30 DIAGNOSIS — I421 Obstructive hypertrophic cardiomyopathy: Secondary | ICD-10-CM | POA: Diagnosis not present

## 2022-08-30 DIAGNOSIS — I951 Orthostatic hypotension: Secondary | ICD-10-CM

## 2022-08-30 DIAGNOSIS — Z01810 Encounter for preprocedural cardiovascular examination: Secondary | ICD-10-CM | POA: Diagnosis not present

## 2022-08-30 DIAGNOSIS — I5022 Chronic systolic (congestive) heart failure: Secondary | ICD-10-CM | POA: Diagnosis not present

## 2022-08-30 DIAGNOSIS — I442 Atrioventricular block, complete: Secondary | ICD-10-CM | POA: Diagnosis not present

## 2022-08-30 DIAGNOSIS — I10 Essential (primary) hypertension: Secondary | ICD-10-CM

## 2022-08-30 NOTE — Patient Instructions (Signed)
Medication Instructions:  The current medical regimen is effective;  continue present plan and medications.  *If you need a refill on your cardiac medications before your next appointment, please call your pharmacy*   Follow-Up: At [facility], you and your health needs are our priority.  As part of our continuing mission to provide you with exceptional heart care, we have created designated Provider Care Teams.  These Care Teams include your primary Cardiologist (physician) and Advanced Practice Providers (APPs -  Physician Assistants and Nurse Practitioners) who all work together to provide you with the care you need, when you need it.  We recommend signing up for the patient portal called "MyChart".  Sign up information is provided on this After Visit Summary.  MyChart is used to connect with patients for Virtual Visits (Telemedicine).  Patients are able to view lab/test results, encounter notes, upcoming appointments, etc.  Non-urgent messages can be sent to your provider as well.   To learn more about what you can do with MyChart, go to [email or web address].    Your next appointment:   6 month(s)  Provider:   Evalina Field, MD

## 2022-09-05 ENCOUNTER — Other Ambulatory Visit: Payer: Self-pay

## 2022-09-05 NOTE — Telephone Encounter (Signed)
ERROR

## 2022-09-06 MED ORDER — FUROSEMIDE 40 MG PO TABS: 40.0000 mg | ORAL_TABLET | Freq: Every day | ORAL | 3 refills | Status: DC

## 2022-09-11 NOTE — Progress Notes (Unsigned)
77 year old [name] is here for routine 46-monthfollow-up of his chronic conditions including T2DM, CKD, cirrhosis, heart failure, resistant hypertension-among other problems-reporting that he is doing ok, still walking, no dizziness or unsteadiness, no falls, and fortunately experiencing completely resolved breast tenderness (spironolactone cessation).

## 2022-09-12 ENCOUNTER — Ambulatory Visit (INDEPENDENT_AMBULATORY_CARE_PROVIDER_SITE_OTHER): Payer: Medicare HMO | Admitting: Internal Medicine

## 2022-09-12 ENCOUNTER — Ambulatory Visit (INDEPENDENT_AMBULATORY_CARE_PROVIDER_SITE_OTHER): Payer: Medicare HMO

## 2022-09-12 VITALS — BP 160/84 | HR 86 | Temp 98.0°F | Ht 66.0 in | Wt 167.9 lb

## 2022-09-12 DIAGNOSIS — I1A0 Resistant hypertension: Secondary | ICD-10-CM

## 2022-09-12 DIAGNOSIS — Z0000 Encounter for general adult medical examination without abnormal findings: Secondary | ICD-10-CM | POA: Diagnosis not present

## 2022-09-12 DIAGNOSIS — J3089 Other allergic rhinitis: Secondary | ICD-10-CM

## 2022-09-12 DIAGNOSIS — D500 Iron deficiency anemia secondary to blood loss (chronic): Secondary | ICD-10-CM

## 2022-09-12 DIAGNOSIS — E1121 Type 2 diabetes mellitus with diabetic nephropathy: Secondary | ICD-10-CM

## 2022-09-12 DIAGNOSIS — R1312 Dysphagia, oropharyngeal phase: Secondary | ICD-10-CM

## 2022-09-12 DIAGNOSIS — E611 Iron deficiency: Secondary | ICD-10-CM

## 2022-09-12 DIAGNOSIS — E1122 Type 2 diabetes mellitus with diabetic chronic kidney disease: Secondary | ICD-10-CM | POA: Diagnosis not present

## 2022-09-12 DIAGNOSIS — R0989 Other specified symptoms and signs involving the circulatory and respiratory systems: Secondary | ICD-10-CM | POA: Diagnosis not present

## 2022-09-12 DIAGNOSIS — N1831 Chronic kidney disease, stage 3a: Secondary | ICD-10-CM

## 2022-09-12 LAB — POCT GLYCOSYLATED HEMOGLOBIN (HGB A1C): Hemoglobin A1C: 6.1 % — AB (ref 4.0–5.6)

## 2022-09-12 LAB — GLUCOSE, CAPILLARY: Glucose-Capillary: 180 mg/dL — ABNORMAL HIGH (ref 70–99)

## 2022-09-12 MED ORDER — METFORMIN HCL 500 MG PO TABS: 500.0000 mg | ORAL_TABLET | Freq: Every day | ORAL | 3 refills | Status: DC

## 2022-09-12 NOTE — Assessment & Plan Note (Signed)
Uncontrolled on multiple medications.  He is adherent.  Skin turgor is only fair and given his desire to continue to walk, and his former hx of orthostasis, no plan today to increase epleronone.  Monitor.

## 2022-09-12 NOTE — Assessment & Plan Note (Addendum)
eGFR most recently 49, best reading in a long time.  Sees nephrologist twice a year. Avoid NSAIDs, ARB/ACEI (renal intolerance in the past), and hypovolemia.

## 2022-09-12 NOTE — Assessment & Plan Note (Signed)
Continues to bother him; sometimes he feels as though he may choke.   Nasal passages pink, smooth and moist, L nasal cavity with a possible medial polyp or turbinate visible, no complete obstruction.  OP with no cobblestoning.  R tonsil is perhaps slightly larger than L.  Uvula is prominent and long. Able to visualize normal appearing epiglottis.  Palpation of throat and neck notable for palpable nontender normal texture submaxillary lymph nodes.  No masses.  Possible etiologies are post nasal draining (though he doesn't feel such and no cobblestoning on exam), globus sensation, oropharyngeal dysphagia causing impaired swallowing of oral secretions, allergies (no improvement with antihistamine), or something more nefarious.  Refer to ENT.  Flonase spray in the meantime.

## 2022-09-12 NOTE — Assessment & Plan Note (Signed)
A1c 6.1: will reduce metformin from 500 bid to 500 daily.  Change A1Cs to q6 m.

## 2022-09-12 NOTE — Patient Instructions (Signed)
Dylan [name],  It was great to see you today.  We discussed the difficulty you are having with your throat and with your nose, and I will be referring you to an ear nose and throat doctor.  In the meantime, I had like for you to try a nasal spray for allergies-I had difficulty ordering it today, so I will have to call you after our visit and let you know which pharmacy to pick it up out.  Glad you are doing so well!  You look healthy and we are so pleased that you are enjoying your walking activities outside.  Great job with your diabetes!  You get to decrease your metformin from 1 pill twice a day to 1 pill once a day only.  Take care and stay well,  [name]

## 2022-09-12 NOTE — Progress Notes (Addendum)
Subjective:   Dylan [name] is a 77 y.o. male who presents for an Initial Medicare Annual Wellness Visit. I connected with  Canary Brim on 09/13/22 by a  Face-To-Face  encounter and verified that I am speaking with the correct person using two identifiers.  Patient Location:  Office/Clinic  Provider Location: Office/Clinic    Review of Systems    Defer to PCP       Objective:    Today's Vitals   09/12/22 1308 09/12/22 1309  BP: (!) 146/85 (!) 160/84  Pulse: 89 86  Temp: 98 F (36.7 C)   TempSrc: Oral   SpO2: 98%   Weight: 167 lb 14.4 oz (76.2 kg)   Height: 5\' 6"  (1.676 m)   PainSc:  0-No pain   Body mass index is 27.1 kg/m.     09/12/2022    1:09 PM 09/12/2022   10:40 AM 06/20/2022   11:33 AM 03/21/2022   10:40 AM 12/13/2021    1:18 PM 11/08/2021    9:53 AM 10/16/2021   10:00 PM  Advanced Directives  Does Patient Have a Medical Advance Directive? No No No No No No No  Would patient like information on creating a medical advance directive? No - Patient declined No - Patient declined No - Patient declined No - Patient declined No - Patient declined No - Patient declined No - Patient declined    Current Medications (verified) Outpatient Encounter Medications as of 09/12/2022  Medication Sig   albuterol (VENTOLIN HFA) 108 (90 Base) MCG/ACT inhaler Inhale into the lungs every 6 (six) hours as needed for wheezing or shortness of breath.   allopurinol (ZYLOPRIM) 100 MG tablet Take 1 tablet (100 mg total) by mouth daily.   amLODipine (NORVASC) 10 MG tablet Take 1 tablet (10 mg total) by mouth daily.   apixaban (ELIQUIS) 5 MG TABS tablet Take 1 tablet (5 mg total) by mouth 2 (two) times daily.   atorvastatin (LIPITOR) 40 MG tablet Take 1 tablet (40 mg total) by mouth daily.   eplerenone (INSPRA) 50 MG tablet Take 1 tablet (50 mg total) by mouth daily.   FREESTYLE LITE test strip 3 (three) times daily.   furosemide (LASIX) 40 MG tablet Take 1 tablet (40 mg total) by mouth daily.    hydrALAZINE (APRESOLINE) 50 MG tablet Take 1 tablet (50 mg total) by mouth 3 (three) times daily.   isosorbide mononitrate (IMDUR) 60 MG 24 hr tablet Take 1 tablet (60 mg total) by mouth daily.   latanoprost (XALATAN) 0.005 % ophthalmic solution Place 1 drop into both eyes at bedtime.   metFORMIN (GLUCOPHAGE) 500 MG tablet Take 1 tablet (500 mg total) by mouth daily.   ofloxacin (OCUFLOX) 0.3 % ophthalmic solution    No facility-administered encounter medications on file as of 09/12/2022.    Allergies (verified) Ace inhibitors and Spironolactone   History: Past Medical History:  Diagnosis Date   Anemia 10/18/2014   Baseline about 12 and stable from 2010 to 2016. Colon 2009 in [place] (records cannot be obtained).  EGD Dr Benson Norway 2011 nl    Aortic atherosclerosis ([place]) 03/28/2020   Incidental finding on imaging CT    Atherosclerosis 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 ([place]) 12/28/2014   ABI Feb 2017 R 0.49; L 0.72 with diffuse dz ABI Aug 2017 R 0.49; L 0.69 ABI Jan 2018 R 0.61; L 0.73  ABI Feb 2019 R 0.55; L 0.71 Sees Dr Bridgett Larsson - recs ABI q 6  months   Chronic diastolic 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 ([place]) 10/18/2014   Noted ECHO 10/2014. Grade 2. EF 50-55%; echo repeated 2019, severe hypertrophy with elevated filling pressures   Chronic kidney disease, stage 3b ([place]) 03/09/2020   Dr. Justin Mend  Nephrologist, f/u Q 38M   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 ([place]) 10/18/2014   Noted ECHO 10/2014. Grade 2. EF 50-55%; echo repeated 2019, severe hypertrophy with elevated filling pressures  Repeat ECHO 08/04/21 showed EF 30-35%, severely elevated PASP, severely reduced RVSF, severely dilated LA and RA, small pericardial effusion     Coronary artery disease without angina pectoris    Degloving injury of finger 03/28/2020   10/21/19: copied from op note "1.  Open reduction percutaneous pinning left small finger proximal  phalanx fracture 2.  Complex repair of laceration to left small finger 3 cm in length 3.  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 No significant callus formation is noted. Pins had been removed   Diverticulosis 10/08/2014   Seen on CT. Reportedly on Colon in [place] in 2009. Freq bouts of diverticulitis.   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 Stable from 2010 to 2016.  Negative SPEP and UPEP 2014 after ARF 2/2 continued ACE (lisinopril 20) use while vol contracted. 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 Failed CPAP on study. BiPA inspiratory pressure of 21 and expiratory pressure of 17 CWP. Consider ENT evaluation for potentially correctable upper airway obstruction contributing to the need for unusually high pressures - ENT July 2015 did not feel any intervention surgically was indicated. He wore a large F&P Simplus fullfac   Personal history of colonic polyps 03/28/2014   Dr. Benson Norway, 2 polyps removed 2015.  No polyps on f/u in 2020.  No further surveillance suggested per Dr. Benson Norway.   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  He is unable to breathe through his nose comfortably.   This is part of the reason why he does not wear his oxygen regularly.  Nasal passages are nearly completely obstructed erythematous smooth glistening surface.  He has been told by his eye doctor that part of the reason his e   Resistant hypertension 10/09/2014   Poor control with 6 drug therapy. 2016 : Aldo 37 but ARR 23.5    Severe pulmonary arterial systolic hypertension ([place]) 10/18/2014   Noted as severe ECHO 2016 and 2019. Likely 2/2 severe untreated OSA. Pt is not adherent to CPAP.   Syncope 12/31/2020   Thrombocytopenia ([place]) 10/09/2016   Nl liver and spleen on [place] 2019   Ulcer aphthous oral 04/27/2020   Evaluated emergency room last week, saw with Magic mouthwash   Unintentional weight loss due  to Trulicity, resolved [id number]   Past Surgical History:  Procedure Laterality Date   BIOPSY  10/10/2021   Procedure: BIOPSY;  Surgeon: Carol Ada, MD;  [facility] END[facility]COPY;  Service: Gastroenterology;;   CHOLECYSTECTOMY     COLONOSCOPY WITH PROPOFOL N/A 02/12/2019   Procedure: COLONOSCOPY WITH PROPOFOL;  Surgeon: Carol Ada, MD;  Location: [facility] ENDOSCOPY;  Service: Endoscopy;  Laterality: N/A;   ESOPHAGOGASTRODUODENOSCOPY (EGD) WITH PROPOFOL N/A 10/10/2021   Procedure: ESOPHAGOGASTRODUODENOSCOPY (EGD) WITH PROPOFOL;  Surgeon: Carol Ada, MD;  Location: [facility];  Service: Gastroenterology;  Laterality: N/A;   PERCUTANEOUS PINNING Left 10/21/2019   Procedure: CLOSED REDUCTION WITH PERCUTANEOUS PINNING AND SUTURE REPAIR OF LACERATION  OF SMALL FINGER,  SUTURE REPAIR OF LACERATION OF INDEX FINGER;  Surgeon: Cindra Presume, MD;  Location: [facility];  Service: Plastics;  Laterality: Left;   RIGHT/LEFT HEART CATH AND CORONARY ANGIOGRAPHY N/A 08/09/2021   Procedure: RIGHT/LEFT HEART CATH AND CORONARY ANGIOGRAPHY;  Surgeon: Troy Sine, MD;  Location: [place] CV LAB;  Service: Cardiovascular;  Laterality: N/A;   Family History  Problem Relation Age of Onset   Heart failure Sister         Died of complications Nov 0000000   CAD Sister        CABG x3 while in her 24's   Social History   Socioeconomic History   Marital status: Divorced    Spouse name: Not on file   Number of children: 2   Years of education: Not on file   Highest education level: Not on file  Occupational History   Occupation: retired  Tobacco Use   Smoking status: Former    Packs/day: 0.50    Years: 20.00    Total pack years: 10.00    Types: Cigarettes    Quit date: 10/02/1972    Years since quitting: 49.9   Smokeless tobacco: Never  Vaping Use   Vaping Use: Never used  Substance and Sexual Activity   Alcohol use: No    Alcohol/week: 0.0 standard drinks of alcohol   Drug use: No   Sexual activity: Never  Other Topics Concern   Not on file  Social History Narrative   Worked in Civil engineer, 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 Had yearly occupational testing inc PFT's. Now retired. Divorced. Has male sig other. Likes to go fishing.      He starting drinking as a teen and would drink to passing out. Couldn't keep job, have family. He quit drinking and smoking cold [place] with help of his faith. No ETOH since about age 76ish      Total of 7 brothers and 6 sisters.   Social Determinants of Health   Financial Resource Strain: Low Risk  (09/12/2022)   Overall Financial Resource Strain (CARDIA)    Difficulty of Paying Living Expenses: Not hard at all  Food Insecurity: No Food Insecurity (09/12/2022)   Hunger Vital Sign    Worried About Running Out of Food in the Last Year: Never true    Ran Out of Food in the Last Year: Never true  Transportation Needs: No Transportation Needs (09/12/2022)   PRAPARE - Hydrologist (Medical): No    Lack of Transportation (Non-Medical): No  Physical Activity: Sufficiently Active (09/12/2022)   Exercise Vital Sign    Days of Exercise per Week: 3 days    Minutes of Exercise per Session: 30 min- enjoys walking, sometimes more than once daily, in neighborhood  Stress:  No Stress Concern  Present (09/12/2022)   [place]    Feeling of Stress : Not at all  Social Connections: Unknown (09/12/2022)   Social Connection and Isolation Panel [NHANES]    Frequency of Communication with Friends and Family: Patient refused    Frequency of Social Gatherings with Friends and Family: Patient refused    Attends Religious Services: Patient refused    Marine scientist or Organizations: No    Attends Archivist Meetings: Patient refused    Marital Status: Divorced  From [name] visit: Mr. Trites has close support from his family and is satisfied with his social interactions.  He didn't refuse the above questions but simply declined to answer these items.   Clinical Intake:  Pre-visit preparation completed: Yes  Pain : No/denies pain Pain Score: 0-No pain    Nutritional Risks: None Diabetes: Yes CBG done?: No CBG resulted in Enter/ Edit results?: No Did pt. bring in CBG monitor from home?: No  How often do you need to have someone help you when you read instructions, pamphlets, or other written materials from your doctor or pharmacy?: 1 - Never  Diabetic?Nutrition Risk Assessment:  Has the patient had any N/V/D within the last 2 months?  No  Does the patient have any non-healing wounds?  No  Has the patient had any unintentional weight loss or weight gain?  No   Diabetes:  Is the patient diabetic?  Yes  If diabetic, was a CBG obtained today?  No  Did the patient bring in their glucometer from home?  No  How often do you monitor your CBG's? Every 3 months (A!C checked every 3 mo)  Financial Strains and Diabetes Management:  Are you having any financial strains with the device, your supplies or your medication? No .  Does the patient want to be seen by Chronic Care Management for management of their diabetes?  No  Would the patient like to be referred to a Nutritionist or  for Diabetic Management?  No  (he has had close f/u by dietician in our clinic and is currently not requiring further education/management)  Diabetic Exams:   Mr. Mazzaferro is actively seeing an ophthalmologist and is receiving injection therapy.  Eye exams are UTD. Diabetic Foot Exam: Overdue, Pt has been advised about the importance in completing this exam.  Interpreter Needed?: No  Information entered by :: Krystel Fletchall,cma   Activities of Daily Living    09/12/2022    1:09 PM 09/12/2022   10:37 AM  In your present state of health, do you have any difficulty performing the following activities:  Hearing? 0 0  Vision? 0 0  Difficulty concentrating or making decisions? 0 0  Walking or climbing stairs? 0 0  Dressing or bathing? 0 0  Doing errands, shopping? 0 0    Patient Care Team: Angelica Pou, MD as PCP - General (Internal Medicine) O'Neal, Cassie Freer, MD as PCP - Cardiology (Cardiology) Elmarie Shiley, MD as Consulting Physician (Nephrology)    Assessment:   This is a routine wellness examination for R[place].  Hearing/Vision screen No results found.  Dietary issues and exercise activities discussed:     Goals Addressed   None   Depression Screen    09/12/2022    1:09 PM 09/12/2022   10:41 AM 06/20/2022   11:34 AM 06/20/2022   10:36 AM 03/21/2022   11:04 AM 12/13/2021    1:19 PM 11/08/2021   10:07  AM  PHQ 2/9 Scores  PHQ - 2 Score 0 0 0 0 0 0 0    Fall Risk    09/12/2022    1:09 PM 09/12/2022   10:37 AM 06/20/2022   10:33 AM 03/21/2022   11:04 AM 12/13/2021    1:18 PM  Fall Risk   Falls in the past year? 0 0 0 0 0  Number falls in past yr: 0 0 0 0 0  Injury with Fall? 1 1 0 0 0  Follow up Falls evaluation completed Falls evaluation completed Falls evaluation completed Falls evaluation completed Falls evaluation completed    FALL RISK PREVENTION PERTAINING TO THE HOME:  Any stairs in or around the home? Yes  If so, are there any without handrails? No  Home free  of loose throw rugs in walkways, pet beds, electrical cords, etc? No  Adequate lighting in your home to reduce risk of falls? No   ASSISTIVE DEVICES UTILIZED TO PREVENT FALLS:  Life alert? No  Use of a cane, walker or w/c? No  Grab bars in the bathroom? No  Shower chair or bench in shower? Yes  Elevated toilet seat or a handicapped toilet? No   TIMED UP AND GO:  Gait slow and steady without use of assistive device  Cognitive Function:        09/12/2022    1:10 PM  6CIT Screen  What Year? 0 points  What month? 0 points  What time? 0 points  Count back from 20 0 points  Months in reverse 0 points  Repeat phrase 0 points  Total Score 0 points    Immunizations Immunization History  Administered Date(s) Administered   Fluad Quad(high Dose 65+) 03/15/2021, 03/21/2022   Influenza,inj,Quad PF,6+ Mos 04/20/2015, 03/07/2016, 03/06/2017, 06/18/2018, 05/06/2019, 03/09/2020   PFIZER(Purple Top)SARS-COV-2 Vaccination 06/21/2020   Pneumococcal Conjugate-13 04/20/2015   Pneumococcal Polysaccharide-23 07/11/2016   Tdap 01/12/2015, 10/21/2019    TDAP status: Up to date  Flu Vaccine status: Up to date  Pneumococcal vaccine status: Up to date  Covid-19 vaccine status: Completed vaccines  Qualifies for Shingles Vaccine? No   Zostavax completed No   Shingrix Completed?: No.    Education has been provided regarding the importance of this vaccine. Patient has been advised to call insurance company to determine out of pocket expense if they have not yet received this vaccine. Advised may also receive vaccine at local pharmacy or Health Dept. Verbalized acceptance and understanding.  Screening Tests Health Maintenance  Topic Date Due   Zoster Vaccines- Shingrix (1 of 2) Never done   COVID-19 Vaccine (2 - 2023-24 season) 03/08/2022   OPHTHALMOLOGY EXAM  06/25/2022   FOOT EXAM  08/02/2022   HEMOGLOBIN A1C  12/13/2022   Diabetic kidney evaluation - eGFR measurement  06/21/2023    Diabetic kidney evaluation - Urine ACR  06/21/2023   LIPID PANEL  06/21/2023   Medicare Annual Wellness (AWV)  09/12/2023   DTaP/Tdap/Td (3 - Td or Tdap) 10/20/2029   Pneumonia Vaccine 63+ Years old  Completed   INFLUENZA VACCINE  Completed   Hepatitis C Screening  Completed   HPV VACCINES  Aged Out   COLONOSCOPY (Pts 45-32yrs Insurance coverage will need to be confirmed)  Discontinued    Health Maintenance  Health Maintenance Due  Topic Date Due   Zoster Vaccines- Shingrix (1 of 2) Never done   COVID-19 Vaccine (2 - 2023-24 season) 03/08/2022   OPHTHALMOLOGY EXAM  06/25/2022   FOOT EXAM  08/02/2022  Colorectal cancer screening: Type of screening: Colonoscopy. Completed 02/12/2019. Repeat every 0 years  Lung Cancer Screening: (Low Dose CT Chest recommended if Age 8-80 years, 30 pack-year currently smoking OR have quit w/in 15years.) does not qualify.   Lung Cancer Screening Referral: N/A  Additional Screening:  Hepatitis C Screening: does not qualify; Completed 10/12/2021  Vision Screening: Recommended annual ophthalmology exams for early detection of glaucoma and other disorders of the eye. Is the patient up to date with their annual eye exam?  Yes Who is the provider or what is the name of the office in which the patient attends annual eye exams?  Dental Screening: Recommended annual dental exams for proper oral hygiene  Community Resource Referral / Chronic Care Management: CRR required this visit?  No   CCM required this visit?  No      Plan:     I have personally reviewed and noted the following in the patient's chart:   Medical and social history Use of alcohol, tobacco or illicit drugs  Current medications and supplements including opioid prescriptions. Patient is not currently taking opioid prescriptions. Functional ability and status Nutritional status Physical activity Advanced directives List of other physicians Hospitalizations, surgeries, and  ER visits in previous 12 months Vitals Screenings to include cognitive, depression, and falls Referrals and appointments  In addition, I have reviewed and discussed with patient certain preventive protocols, quality metrics, and best practice recommendations. A written personalized care plan for preventive services as well as general preventive health recommendations were provided to patient.     Kerin Perna, [place]   09/13/2022   Nurse Notes: Face-To-Face Visit  Mr. Keenan , Thank you for taking time to come for your Medicare Wellness Visit. I appreciate your ongoing commitment to your health goals. Please review the following plan we discussed and let me know if I can assist you in the future.   These are the goals we discussed:  Goals   None     This is a list of the screening recommended for you and due dates:  Health Maintenance  Topic Date Due   Zoster (Shingles) Vaccine (1 of 2) Never done   COVID-19 Vaccine (2 - 2023-24 season) 03/08/2022   Eye exam for diabetics  06/25/2022   Complete foot exam   08/02/2022   Hemoglobin A1C  12/13/2022   Yearly kidney function blood test for diabetes  06/21/2023   Yearly kidney health urinalysis for diabetes  06/21/2023   Lipid (cholesterol) test  06/21/2023   Medicare Annual Wellness Visit  09/12/2023   DTaP/Tdap/Td vaccine (3 - Td or Tdap) 10/20/2029   Pneumonia Vaccine  Completed   Flu Shot  Completed   Hepatitis C Screening: USPSTF Recommendation to screen - Ages 56-79 yo.  Completed   HPV Vaccine  Aged Out   Colon Cancer Screening  Discontinued

## 2022-09-12 NOTE — Assessment & Plan Note (Signed)
No GI bleeding observed by pt.  Check CBC and fe studies for f/u (he stopped Fe supp, associating it with a nosebleed).

## 2022-09-13 ENCOUNTER — Other Ambulatory Visit: Payer: Self-pay | Admitting: Internal Medicine

## 2022-09-13 DIAGNOSIS — J310 Chronic rhinitis: Secondary | ICD-10-CM

## 2022-09-13 LAB — IRON,TIBC AND FERRITIN PANEL
Ferritin: 147 ng/mL (ref 30–400)
Iron Saturation: 26 % (ref 15–55)
Iron: 80 ug/dL (ref 38–169)
Total Iron Binding Capacity: 308 ug/dL (ref 250–450)
UIBC: 228 ug/dL (ref 111–343)

## 2022-09-13 LAB — CBC
Hematocrit: 42.4 % (ref 37.5–51.0)
Hemoglobin: 13.9 g/dL (ref 13.0–17.7)
MCH: 31.5 pg (ref 26.6–33.0)
MCHC: 32.8 g/dL (ref 31.5–35.7)
MCV: 96 fL (ref 79–97)
Platelets: 136 10*3/uL — ABNORMAL LOW (ref 150–450)
RBC: 4.41 x10E6/uL (ref 4.14–5.80)
RDW: 11.8 % (ref 11.6–15.4)
WBC: 9.8 10*3/uL (ref 3.4–10.8)

## 2022-09-13 MED ORDER — FLUTICASONE PROPIONATE 50 MCG/ACT NA SUSP: 1.0000 | Freq: Every day | NASAL | 0 refills | Status: DC

## 2022-09-13 NOTE — Progress Notes (Signed)
Hg and iron studies normalized.  Relayed results to Mr. Scites by phone.

## 2022-09-18 ENCOUNTER — Other Ambulatory Visit: Payer: Self-pay

## 2022-09-20 MED ORDER — HYDRALAZINE HCL 50 MG PO TABS: 50.0000 mg | ORAL_TABLET | Freq: Three times a day (TID) | ORAL | 5 refills | Status: DC

## 2022-10-11 ENCOUNTER — Telehealth: Payer: Self-pay | Admitting: *Deleted

## 2022-10-11 NOTE — Telephone Encounter (Signed)
Patient called in stating when he decreased metformin to once daily, his CBGs were in the 170s. Last 3 days he has gone back to BID dosing and CBGs have been ~ 120. Today AM fasting was 119. He wants to keep BID dosing so he doesn't "blow out my kidneys."

## 2022-11-11 ENCOUNTER — Other Ambulatory Visit: Payer: Self-pay

## 2022-11-11 MED ORDER — FREESTYLE LITE TEST VI STRP: 1.0000 | ORAL_STRIP | Freq: Every morning | 3 refills | Status: DC

## 2022-11-25 ENCOUNTER — Other Ambulatory Visit: Payer: Self-pay

## 2022-11-25 DIAGNOSIS — E1122 Type 2 diabetes mellitus with diabetic chronic kidney disease: Secondary | ICD-10-CM

## 2022-11-25 MED ORDER — AMLODIPINE BESYLATE 10 MG PO TABS: 10.0000 mg | ORAL_TABLET | Freq: Every day | ORAL | 3 refills | Status: DC

## 2022-12-18 ENCOUNTER — Other Ambulatory Visit: Payer: Self-pay

## 2022-12-18 NOTE — Progress Notes (Signed)
   Dylan [name] [birth year] [id number]  Patient outreached by Sharion Dove , PharmD Candidate on 12/18/22.  Blood Pressure Readings: Last documented ambulatory systolic blood pressure: 135 Last documented ambulatory diastolic blood pressure: 80 Does the patient have a validated home blood pressure machine?: Yes   Is the patient taking their medications as prescribed?: Yes Patient was uninterested in speaking on the phone about their medications and said they would prefer to speak with their physician in person about any questions or concerns.   The following barriers to adherence were noted: Does the patient have questions or concerns about their medications?: No Does the patient have a follow up scheduled with their primary care provider/cardiologist?: Yes   Interventions: Patient was reminded of their upcoming appointment with their provider and was educated on taking their BP medications before the appointment.     The patient has follow up scheduled:  PCP: Miguel Aschoff, MD   Sharion Dove, Student-PharmD

## 2022-12-19 ENCOUNTER — Ambulatory Visit (INDEPENDENT_AMBULATORY_CARE_PROVIDER_SITE_OTHER): Payer: Medicare HMO | Admitting: Internal Medicine

## 2022-12-19 VITALS — BP 139/74 | HR 88 | Temp 98.1°F | Ht 66.0 in | Wt 160.8 lb

## 2022-12-19 DIAGNOSIS — I5022 Chronic systolic (congestive) heart failure: Secondary | ICD-10-CM

## 2022-12-19 DIAGNOSIS — Z7984 Long term (current) use of oral hypoglycemic drugs: Secondary | ICD-10-CM

## 2022-12-19 DIAGNOSIS — K7460 Unspecified cirrhosis of liver: Secondary | ICD-10-CM | POA: Diagnosis not present

## 2022-12-19 DIAGNOSIS — R188 Other ascites: Secondary | ICD-10-CM

## 2022-12-19 DIAGNOSIS — R0989 Other specified symptoms and signs involving the circulatory and respiratory systems: Secondary | ICD-10-CM

## 2022-12-19 DIAGNOSIS — L821 Other seborrheic keratosis: Secondary | ICD-10-CM | POA: Insufficient documentation

## 2022-12-19 DIAGNOSIS — E1122 Type 2 diabetes mellitus with diabetic chronic kidney disease: Secondary | ICD-10-CM | POA: Diagnosis not present

## 2022-12-19 DIAGNOSIS — E1121 Type 2 diabetes mellitus with diabetic nephropathy: Secondary | ICD-10-CM

## 2022-12-19 DIAGNOSIS — N1831 Chronic kidney disease, stage 3a: Secondary | ICD-10-CM

## 2022-12-19 DIAGNOSIS — I1A0 Resistant hypertension: Secondary | ICD-10-CM

## 2022-12-19 LAB — POCT GLYCOSYLATED HEMOGLOBIN (HGB A1C): Hemoglobin A1C: 5.7 % — AB (ref 4.0–5.6)

## 2022-12-19 LAB — GLUCOSE, CAPILLARY: Glucose-Capillary: 110 mg/dL — ABNORMAL HIGH (ref 70–99)

## 2022-12-19 NOTE — Assessment & Plan Note (Signed)
Due for recheck today.  BMP ordered.

## 2022-12-19 NOTE — Assessment & Plan Note (Addendum)
Metformin reduced from 500 twice daily to 500 daily based on tight control.  He was uncomfortable with some of his glucose readings and independently went back to bid metformin.  A1c today 5.7.  He prefers to stay on twice daily dosing.

## 2022-12-19 NOTE — Assessment & Plan Note (Signed)
BP 139/74 on multiple agents which have not changed since last visit.  BMP today to assess tolerance of diuretics.

## 2022-12-19 NOTE — Progress Notes (Signed)
77 year old [name] is here for routine 35-month follow-up of multiple chronic conditions and reports that he is doing well, feeling no discomforts (except as documented below) and is continuing to walk every day.  He has not required his oxygen now for 2 months and will be calling the company to pick up his supply.  He has not yet heard about scheduling an appointment with an ENT doctor, to whom I referred him at last visit for evaluation of chronic runny nose and chronic clearing of throat.  Looks as though this referral was authorized but apparently not yet scheduled (I will follow-up with our referral coordinator).  He has been particularly concerned because he heard of an individual who was experiencing headaches and seizures who was found to have a meningitis which began with a sinus infection.  Incidentally he has been experiencing low grade (just enough to let me know it is uncomfortable) constant headache, steady, daily, notices more when he is lying down to go to sleep and finding it a bit harder to fall asleep.  Discomfort is in the bilateral temporal area. Has never had a headache before.  No dizziness or lightheadedness, no nausea.  No difficulty moving arms or legs.  No stiff neck, no discomfort with eye movements.  Coughs phlegm from his throat, sometimes "like a gel".  Nose passages are clear, but sometimes feels stuffy , but not productive when he blows.  Runny nose, still drips especially when he leans forward.  BP 139/74 (BP Location: Right Arm, Patient Position: Sitting, Cuff Size: Small)   Pulse 88   Temp 98.1 F (36.7 C) (Oral)   Ht 5\' 6"  (1.676 m)   Wt 160 lb 12.8 oz (72.9 kg)   SpO2 97%   BMI 25.95 kg/m  [employer] is looking well.  Walks with a stable confident gait.  Able to get up onto the examining table without assistance.  Nasal passages with smooth wet pink mucosa, some narrowing and either a prominent turbinate or a polyp may be present in the posterior left passage.   Oropharynx symmetric.  There are some cobblestoning changes.  No exudate.  Bitemporal areas of head are nontender.  No pain or tenderness over the temporomandibular joint with pressure or with opening/closing jaw.  Gaze is conjugate, face is symmetric.  He has chronic proptosis and sees an eye doctor frequently.  Skin turgor is mildly reduced throughout.  Lungs are clear to auscultation throughout.  Heart somewhat fast irregular irregular.  Midsystolic murmur over the base.  Neck veins flat.  Abdomen is soft nondistended and nontender.  He does have an increased waist to hip ratio.  No apparent ascites on exam.  Legs with no lower extremity edema.  Feet are sensate with skin in good condition, no nail concerns.  Dorsalis pedis pulses are 1+ symmetric.  Back with seborrheic keratoses in symmetric "Christmas tree" pattern  Assessment and plan:  DM (diabetes mellitus), type 2 with renal complications ([name]) Metformin reduced from 500 twice daily to 500 daily based on tight control.  He was uncomfortable with some of his glucose readings and independently went back to bid metformin.  A1c today 5.7.  He prefers to stay on twice daily dosing.  Cirrhosis of liver ([name]) Due for RUQ [place] for [name] screening.  Discussed importance of screening with Mr. Biamonte.  He did not recall being told that he had cirrhosis during the hospitalization 1 year ago.  He recalls that he was a heavy drinker from  early teenage years (has since quit).  Patient remains compensated and asymptomatic.  Monitor.  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  Lungs are clear and no lower extremity edema is present.  Continue regimen.  BMP today to assess tolerance of diuretic.  Resistant hypertension (hyperaldosteronism) BP 139/74 on multiple agents which have not changed since last visit.  BMP  today to assess tolerance of diuretics.  Stage 3a chronic kidney disease (CKD) ([name]) Due for recheck today.  BMP ordered.  Phlegm in throat Referral to ENT was placed 3 months ago and has been authorized but not yet scheduled.  I will follow-up on this.  He continues to feel constant need to clear his throat and often coughs a jellylike phlegm from the throat itself.  He has a history of nasal problems and we had planned to send him to ENT anyway.  At 1 point, surgery was recommended for chronic nasal obstruction.  We have tried antihistamines and topical nasal sprays.  Seborrheic keratoses Predominantly macular, on back, in Christmas tree pattern.  Non-inflamed. Monitor.

## 2022-12-19 NOTE — Assessment & Plan Note (Signed)
Referral to ENT was placed 3 months ago and has been authorized but not yet scheduled.  I will follow-up on this.  He continues to feel constant need to clear his throat and often coughs a jellylike phlegm from the throat itself.  He has a history of nasal problems and we had planned to send him to ENT anyway.  At 1 point, surgery was recommended for chronic nasal obstruction.  We have tried antihistamines and topical nasal sprays.

## 2022-12-19 NOTE — Assessment & Plan Note (Signed)
Predominantly macular, on back, in Christmas tree pattern.  Non-inflamed. Monitor.

## 2022-12-19 NOTE — Assessment & Plan Note (Signed)
Asymptomatic-able to lie down without orthopnea and out walking community distances without shortness of breath.  Lungs are clear and no lower extremity edema is present.  Continue regimen.  BMP today to assess tolerance of diuretic.

## 2022-12-19 NOTE — Assessment & Plan Note (Addendum)
Due for RUQ [place] for [facility] screening.  Discussed importance of screening with Mr. Aust.  He did not recall being told that he had cirrhosis during the hospitalization 1 year ago.  He recalls that he was a heavy drinker from early teenage years (has since quit).  Patient remains compensated and asymptomatic.  Monitor.

## 2022-12-20 LAB — BMP8+ANION GAP
Anion Gap: 18 mmol/L (ref 10.0–18.0)
BUN/Creatinine Ratio: 16 (ref 10–24)
BUN: 32 mg/dL — ABNORMAL HIGH (ref 8–27)
CO2: 20 mmol/L (ref 20–29)
Calcium: 10.4 mg/dL — ABNORMAL HIGH (ref 8.6–10.2)
Chloride: 101 mmol/L (ref 96–106)
Creatinine, Ser: 1.97 mg/dL — ABNORMAL HIGH (ref 0.76–1.27)
Glucose: 113 mg/dL — ABNORMAL HIGH (ref 70–99)
Potassium: 4 mmol/L (ref 3.5–5.2)
Sodium: 139 mmol/L (ref 134–144)
eGFR: 34 mL/min/{1.73_m2} — ABNORMAL LOW (ref >59)

## 2022-12-26 ENCOUNTER — Other Ambulatory Visit: Payer: Self-pay

## 2022-12-26 DIAGNOSIS — I4892 Unspecified atrial flutter: Secondary | ICD-10-CM

## 2022-12-27 MED ORDER — APIXABAN 5 MG PO TABS
5.0000 mg | ORAL_TABLET | Freq: Two times a day (BID) | ORAL | 11 refills | Status: DC
Start: 2022-12-27 — End: 2024-02-04

## 2023-01-02 ENCOUNTER — Other Ambulatory Visit: Payer: Self-pay

## 2023-01-03 ENCOUNTER — Encounter: Payer: Self-pay | Admitting: Podiatry

## 2023-01-03 ENCOUNTER — Ambulatory Visit (INDEPENDENT_AMBULATORY_CARE_PROVIDER_SITE_OTHER): Payer: Medicare HMO | Admitting: Podiatry

## 2023-01-03 DIAGNOSIS — M79674 Pain in right toe(s): Secondary | ICD-10-CM | POA: Diagnosis not present

## 2023-01-03 DIAGNOSIS — M79675 Pain in left toe(s): Secondary | ICD-10-CM

## 2023-01-03 DIAGNOSIS — B351 Tinea unguium: Secondary | ICD-10-CM

## 2023-01-03 MED ORDER — ALLOPURINOL 100 MG PO TABS: 100.0000 mg | ORAL_TABLET | Freq: Every day | ORAL | 3 refills | Status: DC

## 2023-01-13 ENCOUNTER — Telehealth: Payer: Self-pay | Admitting: *Deleted

## 2023-01-13 NOTE — Telephone Encounter (Signed)
Spoke with patient regarding the appointment for Ultra sound of the abd (01/20/2023 @ 10:00 am @ [place] to arrive 15 min early and to be NPO after midnight.  Patient was a lil upset he was not aware and wanted to know who and why this appointment. Will send Dr Mayford Knife a messaged to contact patient to explain whe he is getting the ultrasound.

## 2023-01-20 ENCOUNTER — Telehealth: Payer: Self-pay | Admitting: *Deleted

## 2023-01-20 ENCOUNTER — Ambulatory Visit (HOSPITAL_COMMUNITY): Payer: Medicare HMO

## 2023-01-20 NOTE — Telephone Encounter (Signed)
Call from patient states was told by Pharmacy that his prescription had been written for Metformin 1 tablet per day.  Patient states that he is supposed to be taking 2 tablets a day.  Did not know it had been changed.  Patient stated that when he takes the 1 tablet per day his sugars go up.  Prefers to continue on the 2 tablets per day.  Needs prescription redone for 2 tablets per day so that his insurance will cover.

## 2023-01-22 ENCOUNTER — Other Ambulatory Visit: Payer: Self-pay | Admitting: Internal Medicine

## 2023-01-22 DIAGNOSIS — E1121 Type 2 diabetes mellitus with diabetic nephropathy: Secondary | ICD-10-CM

## 2023-01-22 MED ORDER — METFORMIN HCL 500 MG PO TABS: 500.0000 mg | ORAL_TABLET | Freq: Two times a day (BID) | ORAL | 3 refills | Status: DC

## 2023-02-04 NOTE — Progress Notes (Signed)
Subjective:   Patient ID: Dylan [name], male   DOB: 77 y.o.   MRN: [id number]   HPI Patient presents stating that he has nailbeds that he is not able to take care of himself they are thick they become painful with shoe gear and he has not had success with taking care of them presents with caregiver.  Patient does not smoke is not active   Review of Systems  All other systems reviewed and are negative.       Objective:  Physical Exam Vitals and nursing note reviewed.  Constitutional:      Appearance: He is well-developed.  Pulmonary:     Effort: Pulmonary effort is normal.  Musculoskeletal:        General: Normal range of motion.  Skin:    General: Skin is warm.  Neurological:     Mental Status: He is alert.     Neurovascular status found to be moderately diminished PT DP pulses with patient found to have thick yellow brittle nailbeds 1-5 both feet that are moderately painful when pressed and is noted to have good digital perfusion well-oriented x 3     Assessment:  Chronic mycotic nail infection with pain 1-5 both feet     Plan:  H&P reviewed condition discussed treatment options.  Do not recommend oral topical or laser and at this point debridement accomplished no angiogenic bleeding reappoint routine care

## 2023-02-14 ENCOUNTER — Ambulatory Visit (HOSPITAL_COMMUNITY)
Admission: RE | Admit: 2023-02-14 | Discharge: 2023-02-14 | Disposition: A | Discharge status: Home / Self Care | Payer: Medicare HMO | Source: Ambulatory Visit | Attending: Internal Medicine | Admitting: Internal Medicine

## 2023-02-14 DIAGNOSIS — K7460 Unspecified cirrhosis of liver: Secondary | ICD-10-CM | POA: Diagnosis present

## 2023-02-14 DIAGNOSIS — R188 Other ascites: Secondary | ICD-10-CM

## 2023-02-20 ENCOUNTER — Other Ambulatory Visit: Payer: Self-pay

## 2023-02-20 DIAGNOSIS — I1A0 Resistant hypertension: Secondary | ICD-10-CM

## 2023-02-20 DIAGNOSIS — I421 Obstructive hypertrophic cardiomyopathy: Secondary | ICD-10-CM

## 2023-02-20 MED ORDER — EPLERENONE 50 MG PO TABS
50.0000 mg | ORAL_TABLET | Freq: Every day | ORAL | 3 refills | Status: DC
Start: 2023-02-20 — End: 2023-06-27

## 2023-03-04 ENCOUNTER — Other Ambulatory Visit: Payer: Self-pay

## 2023-03-04 DIAGNOSIS — I70219 Atherosclerosis of native arteries of extremities with intermittent claudication, unspecified extremity: Secondary | ICD-10-CM

## 2023-03-04 MED ORDER — ATORVASTATIN CALCIUM 40 MG PO TABS: 40.0000 mg | ORAL_TABLET | Freq: Every day | ORAL | 3 refills | Status: DC

## 2023-03-20 ENCOUNTER — Encounter: Payer: Medicare HMO | Admitting: Internal Medicine

## 2023-03-21 ENCOUNTER — Other Ambulatory Visit: Payer: Self-pay

## 2023-03-21 MED ORDER — HYDRALAZINE HCL 50 MG PO TABS: 50.0000 mg | ORAL_TABLET | Freq: Three times a day (TID) | ORAL | 5 refills | Status: DC

## 2023-04-17 ENCOUNTER — Encounter: Payer: Medicare HMO | Admitting: Internal Medicine

## 2023-05-01 ENCOUNTER — Ambulatory Visit: Payer: Medicare HMO | Admitting: Internal Medicine

## 2023-05-01 VITALS — BP 125/62 | HR 90 | Temp 98.6°F | Ht 66.0 in | Wt 158.8 lb

## 2023-05-01 DIAGNOSIS — E1121 Type 2 diabetes mellitus with diabetic nephropathy: Secondary | ICD-10-CM | POA: Diagnosis not present

## 2023-05-01 DIAGNOSIS — Z0000 Encounter for general adult medical examination without abnormal findings: Secondary | ICD-10-CM

## 2023-05-01 DIAGNOSIS — N1832 Chronic kidney disease, stage 3b: Secondary | ICD-10-CM | POA: Diagnosis not present

## 2023-05-01 DIAGNOSIS — I4819 Other persistent atrial fibrillation: Secondary | ICD-10-CM | POA: Diagnosis not present

## 2023-05-01 DIAGNOSIS — I2729 Other secondary pulmonary hypertension: Secondary | ICD-10-CM

## 2023-05-01 DIAGNOSIS — N2581 Secondary hyperparathyroidism of renal origin: Secondary | ICD-10-CM

## 2023-05-01 DIAGNOSIS — E1140 Type 2 diabetes mellitus with diabetic neuropathy, unspecified: Secondary | ICD-10-CM | POA: Diagnosis not present

## 2023-05-01 DIAGNOSIS — G478 Other sleep disorders: Secondary | ICD-10-CM

## 2023-05-01 DIAGNOSIS — I5043 Acute on chronic combined systolic (congestive) and diastolic (congestive) heart failure: Secondary | ICD-10-CM

## 2023-05-01 DIAGNOSIS — D6869 Other thrombophilia: Secondary | ICD-10-CM

## 2023-05-01 DIAGNOSIS — E7841 Elevated Lipoprotein(a): Secondary | ICD-10-CM

## 2023-05-01 DIAGNOSIS — I70213 Atherosclerosis of native arteries of extremities with intermittent claudication, bilateral legs: Secondary | ICD-10-CM

## 2023-05-01 LAB — POCT GLYCOSYLATED HEMOGLOBIN (HGB A1C): Hemoglobin A1C: 5.4 % (ref 4.0–5.6)

## 2023-05-01 LAB — GLUCOSE, CAPILLARY: Glucose-Capillary: 101 mg/dL — ABNORMAL HIGH (ref 70–99)

## 2023-05-01 NOTE — Patient Instructions (Addendum)
[name], Dylan [name] to see you today!  We discussed many things. Your diabetes is still well-controlled (A1c 5.7) that we will see how you do without the metformin.  Please stop it now.  You may also notice some improvement in your diarrhea-we will see.  I do not know how to explain the sleepiness that you are feeling from hydralazine.  You have been on it for a long time, and it does seem to be doing a good job for your blood pressure.  Lets not make any changes for now, but please keep track of your symptoms so we can discuss them next time.  We talked about your toenails and that we will need a good 30-minute appointment to soak your feet and work on the collection of material under the nails.  Keep an eye on those toes.  It is okay for you to walk even if you do have some swelling, as long as you are not experiencing pain or redness.  I know you do a good job taking care of your feet.  We talked about your liver tests.  We know that you have some cirrhosis as a result of alcohol many decades ago.  We will periodically check an ultrasound of your liver to make sure that you are not developing a small liver cancer, which can happen in a small number of people who do have cirrhosis.  Keep up the good work!  Dr. Mayford Knife

## 2023-05-01 NOTE — Progress Notes (Signed)
Dr. Allyne Gee did eye injections  Toeneails  Great toe selling  Not walking out of concern for feet  Hydralazine makes him drowsy  Sometimes veg, not eating enough  Likes to eat  Diarrhea, month at least .  Once had miralax daily, then went the other way. Tried every other day.  Taking mir every 3 days.  Milk the problem?  Who knows - almond mild, still running to the bathroom.Not a lactose intolerance  4 times diarrhea dailyasymptomatic.  Watery.   Sometimes float? No particular   Change metformin to [place]  [facility] flu shot ? Got rsv, thinks maybe flu  Shingles adamantly against.

## 2023-05-02 LAB — BMP8+ANION GAP
Anion Gap: 18 mmol/L (ref 10.0–18.0)
BUN/Creatinine Ratio: 18 (ref 10–24)
BUN: 34 mg/dL — ABNORMAL HIGH (ref 8–27)
CO2: 21 mmol/L (ref 20–29)
Calcium: 9.8 mg/dL (ref 8.6–10.2)
Chloride: 101 mmol/L (ref 96–106)
Creatinine, Ser: 1.92 mg/dL — ABNORMAL HIGH (ref 0.76–1.27)
Glucose: 106 mg/dL — ABNORMAL HIGH (ref 70–99)
Potassium: 3.1 mmol/L — ABNORMAL LOW (ref 3.5–5.2)
Sodium: 140 mmol/L (ref 134–144)
eGFR: 35 mL/min/{1.73_m2} — ABNORMAL LOW (ref >59)

## 2023-05-05 ENCOUNTER — Telehealth: Payer: Self-pay | Admitting: *Deleted

## 2023-05-05 NOTE — Telephone Encounter (Signed)
Received K+ result from the lab; K+ level 3.1 done on 1024. Given to Dr Antony Contras. The Attending.

## 2023-05-18 ENCOUNTER — Encounter: Payer: Self-pay | Admitting: Internal Medicine

## 2023-05-18 DIAGNOSIS — I5043 Acute on chronic combined systolic (congestive) and diastolic (congestive) heart failure: Secondary | ICD-10-CM | POA: Insufficient documentation

## 2023-05-18 NOTE — Assessment & Plan Note (Signed)
No clinical findings of R heart failure (neck veins are flat; no LE edema).  Monitor.

## 2023-05-18 NOTE — Assessment & Plan Note (Signed)
Irregular rhythm on exam; asymptomatic.  Rate controlled, anticoagulated (apixaban, tolerated well).  Continue to monitor.

## 2023-05-18 NOTE — Progress Notes (Signed)
77 y.o. Dylan [name] is here for routine follow-up of chronic conditions; he is accompanied by his granddaughter. Generally doing well.  Since last visit he has undergone eye injections by Dr. Grayling Congress. Went to podiatrist for trimming and thinks his undernail skin may have been nicked - the great toe has been sore and swollen; he hasn't been doing his exercise walks out of concern for his feet, until we looked at them together today.l Reports that hydralazine makes him drowsy; not dizzy, no positional changes noted.   Reports good appetite, though not a lot of veg. Reports loose stools (up to 4/day; watery; doesn't float or smell foul; not discolored) for about a month; doesn't feel ill.  Had once been requiring miralax daily, became constipated, now taking every 3 days. He queries if dairy is the problem.  Family changed to almond milk, he doesn't think there's been a difference.  Breathing well, no chest discomfort.  Taking medicines as prescribed - brought for review and confirmation.  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), nocturnal 08/03/2021   Subareolar gynecomastia in male due to spironolactone, resolved 04/09/2021   Secondary hyperparathyroidism of renal origin ([name]) 01/25/2021   RBBB 11/23/2020   History of GI diverticular bleed 11/21/2020   History of BPH 09/14/2020   Healthcare maintenance 09/13/2020   Urge urinary incontinence 06/29/2020   Refractory obstruction  of nasal airway 04/27/2020   Aortic atherosclerosis ([name]) 03/28/2020   Stage 3b chronic kidney disease (CKD) ([name]) 03/09/2020   Coronary artery disease without angina pectoris 04/04/2018   Thrombocytopenia ([name]) 10/09/2016   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   Atherosclerosis 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 ([name]) 12/28/2014   Gout 10/19/2014   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, Disp: , Rfl:    allopurinol (ZYLOPRIM) 100 MG tablet, Take 1 tablet (100 mg total) by mouth daily., Disp: 90 tablet, Rfl: 3   amLODipine (NORVASC) 10 MG tablet, Take 1 tablet (10 mg total) by mouth daily., Disp: 90 tablet, Rfl: 3   apixaban (ELIQUIS) 5 MG TABS tablet, Take 1 tablet (5 mg total) by mouth 2 (two) times daily., Disp: 60 tablet, Rfl: 11   atorvastatin (LIPITOR) 40 MG tablet, Take 1 tablet (40 mg total) by mouth daily., Disp: 90 tablet, Rfl: 3   eplerenone (INSPRA) 50 MG tablet, Take 1 tablet (50 mg total) by mouth daily., Disp: 90 tablet, Rfl: 3   fluticasone (FLONASE) 50 MCG/ACT nasal spray, Place 1 spray into both nostrils daily., Disp: 1  g, Rfl: 0   FREESTYLE LITE test strip, 1 each by Other route in the morning., Disp: 100 each, Rfl: 3   furosemide (LASIX) 40 MG tablet, Take 1 tablet (40 mg total) by mouth daily., Disp: 90 tablet, Rfl: 3   hydrALAZINE (APRESOLINE) 50 MG tablet, Take 1 tablet (50 mg total) by mouth 3  (three) times daily., Disp: 90 tablet, Rfl: 5   latanoprost (XALATAN) 0.005 % ophthalmic solution, Place 1 drop into both eyes at bedtime., Disp: , Rfl:    metFORMIN (GLUCOPHAGE) 500 MG tablet, Take 1 tablet (500 mg total) by mouth 2 (two) times daily with a meal., Disp: 180 tablet, Rfl: 3   ofloxacin (OCUFLOX) 0.3 % ophthalmic solution, , Disp: , Rfl:   Functional Status: dependent in IADLs, independent in ADLs.  Lives with family; great support. He is always accompanied to visits by a family member.  Objective BP 125/62 (BP Location: Left Arm, Patient Position: Sitting, Cuff Size: Small)   Pulse 90   Temp 98.6 F (37 C) (Oral)   Ht 5\' 6"  (1.676 m)   Wt 158 lb 12.8 oz (72 kg)   SpO2 96%   BMI 25.63 kg/m   Exam: Cheerful, talkative, well appearing.  Neck veins flat; lungs clear, no LE edema.  Heart IIRR with syst murmur base. Feet in good condition.  Nails have been trimmed, though there is significant subungual debris and dead skin beneath distal nails.   Problems addressed today:  Type 2 diabetes mellitus with diabetic nephropathy, without long-term current use of insulin ([name]) Assessment & Plan: A1c 5.4 today on metformin 500 mg bid.  Given his loose stools and discussion regarding no benefit of such tightly controlled diabetes, and given his polypharmacy, we will stop metformin for now; recheck 3-6 months.    UTD on eye exams (under active treatment) UCR next visit  Orders: -     POCT glycosylated hemoglobin (Hb A1C) -     BMP8+Anion Gap  Stage 3b chronic kidney disease (CKD) ([name]) Assessment & Plan: 12/2022: BUN 32, creatinine 1.97, EGFR 34.  This was down from the upper 40s about 6 months prior to that.  UCR in 400s nearly a year ago, I am sure will be repeated at [facility]. In the past couple of years SGLT2 inhibitor has been cost prohibitive.  He sustained excessive dangerous weight loss on GLP-1 a.  Thiazides contraindicated due to his gout. He has had acute renal  problems with ACEI/ARBs.  Important to avoid hypovolemia and NSAIDs.  Hopefully he will not experience clinically significant renal decompensation during his lifetime.   Healthcare maintenance Assessment & Plan: Got rsv vaccine, thinks maybe flu as well, at [facility]. Shingles adamantly against.    Persistent atrial fibrillation ([name]) Assessment & Plan: Irregular rhythm on exam; asymptomatic.  Rate controlled, anticoagulated (apixaban, tolerated well).  Continue to monitor.    Pulmonary hypertension due to sleep-disordered breathing ([name]) Assessment & Plan: No clinical findings of R heart failure (neck veins are flat; no LE edema).  Monitor.   Hypercoagulable state due to persistent atrial fibrillation [facility]) Assessment & Plan: Rate controlled; anticoagulation with apixaban. Hx of diverticular bleed; no planned antiplatelet agents.    Elevated lipoprotein(a) Assessment & Plan: 06/2022: TC 117, HDL 38, TG 57, LDL ?  Prescribed atorvastatin 40 mg daily.  Will be due for lipid panel at next visit.   Other orders -     Glucose, capillary     Return in about 8 weeks (  around 06/26/2023) for toenail care - soak and trim, 30 minutes.

## 2023-05-18 NOTE — Assessment & Plan Note (Signed)
Got rsv vaccine, thinks maybe flu as well, at [facility]. Shingles adamantly against.

## 2023-05-18 NOTE — Assessment & Plan Note (Signed)
Rate controlled; anticoagulation with apixaban. Hx of diverticular bleed; no planned antiplatelet agents.

## 2023-05-18 NOTE — Assessment & Plan Note (Signed)
06/2022: TC 117, HDL 38, TG 57, LDL ?  Prescribed atorvastatin 40 mg daily.  Will be due for lipid panel at next visit.

## 2023-05-18 NOTE — Assessment & Plan Note (Signed)
A1c 5.4 today on metformin 500 mg bid.  Given his loose stools and discussion regarding no benefit of such tightly controlled diabetes, and given his polypharmacy, we will stop metformin for now; recheck 3-6 months.    UTD on eye exams (under active treatment) UCR next visit

## 2023-05-18 NOTE — Assessment & Plan Note (Signed)
12/2022: BUN 32, creatinine 1.97, EGFR 34.  This was down from the upper 40s about 6 months prior to that.  UCR in 400s nearly a year ago, I am sure will be repeated at [facility]. In the past couple of years SGLT2 inhibitor has been cost prohibitive.  He sustained excessive dangerous weight loss on GLP-1 a.  Thiazides contraindicated due to his gout. He has had acute renal problems with ACEI/ARBs.  Important to avoid hypovolemia and NSAIDs.  Hopefully he will not experience clinically significant renal decompensation during his lifetime.

## 2023-05-19 NOTE — Progress Notes (Unsigned)
Cardiology Office Note:  .   Date:  05/21/2023  ID:  Dylan [name], DOB [birth year], MRN [id number] PCP: Miguel Aschoff, MD  [facility] Providers Cardiologist:  Reatha Harps, MD { History of Present Illness: .   Dylan [name] is a 77 y.o. male with history of HCM, CHF, CAD, HLD, persistent afib, DM, CKD 3b, pHTN, cirrhosis who presents for follow-up.    History of Present Illness   Dylan [name], a 77 year old patient with a history of persistent atrial fibrillation, hypertrophic cardiomyopathy, systolic heart failure, CAD, diabetes, CKD stage 3B, and pulmonary hypertension, presents for a routine follow-up. The patient reports occasional chest discomfort described as a "little pinch" but denies any significant breathing difficulties. He has been on oxygen therapy for about six months. The patient has been monitoring his blood pressure at home, which has been within normal limits. He has been adhering to his medication regimen, including metformin for diabetes, which he takes after eating to avoid gastrointestinal upset. The patient also reports undergoing regular eye injections for a condition causing bleeding in the back of the eye, as he has been hesitant to undergo recommended eye surgery due to concerns about his heart condition and potential postoperative complications. Denies CP, SOB, syncope or dizziness. Overall, quite stable.          Problem List  Hypertrophic cardiomyopathy/Systolic HF -EF 50% echo 12/2020 -EF 25% on CMR (arrhythmia artifact?) 03/2021 -EF 30-35% 07/2021 -basal septum 21 mm -3% LGE 2. CAD -45% mLAD -85% D2 (08/09/2021) -T chol 125, HDL 38, LDL 75. TG 57 3. Paroxysmal Afib/flutter 4. CHB detected on monitor -nocturnal 02/18/2021 @ 4:02 AM 5. Diabetes -A1c 5.4 6. HTN 7. CKD 3b 8. Prior tobacco abuse -20 pack years  9. Pulmonary hypertension 10. OSA 11. Cirrhosis     ROS: All other ROS reviewed and negative. Pertinent positives noted in the  HPI.     Studies Reviewed: Marland Kitchen   EKG Interpretation Date/Time:  Wednesday May 21 2023 09:45:32 EST Ventricular Rate:  82 PR Interval:    QRS Duration:  150 QT Interval:  422 QTC Calculation: 493 R Axis:   -32  Text Interpretation: Atrial fibrillation Left axis deviation Right bundle branch block Marked T wave abnormality, consider inferior ischemia Confirmed by [name] [phone]) on 05/21/2023 9:52:56 AM   Physical Exam:   VS:  BP 130/70 (BP Location: Left Arm, Patient Position: Sitting, Cuff Size: Normal)   Pulse 95   Ht 5\' 6"  (1.676 m)   Wt 157 lb (71.2 kg)   SpO2 94%   BMI 25.34 kg/m    Wt Readings from Last 3 Encounters:  05/21/23 157 lb (71.2 kg)  05/01/23 158 lb 12.8 oz (72 kg)  12/19/22 160 lb 12.8 oz (72.9 kg)    GEN: Well nourished, well developed in no acute distress NECK: No JVD; No carotid bruits CARDIAC: irregular rhythm  no murmurs, rubs, gallops RESPIRATORY:  Clear to auscultation without rales, wheezing or rhonchi  ABDOMEN: Soft, non-tender, non-distended EXTREMITIES:  No edema; No deformity  ASSESSMENT AND PLAN: .   Assessment and Plan    Hypertrophic Cardiomyopathy with Systolic Heart Failure EF reduced to 35% with asymptomatic atrial fibrillation. Not a candidate for ICD or pacemaker per EP. No symptoms of angina or heart block. -Continue current medical therapy. -Order echocardiogram to update current status. -Discussed family screening for hypertrophic cardiomyopathy. -no AV nodal agents due to nocturnal CHB. On eplerenone 50 mg daily and hydralazine. Consider  switching to imdur and off norvasc at next visit. Has been stable so wanting to continue current regimen.    Persistent Afib -rate controlled on no meds. No AV nodal agents due to nocturnal CHB. No symptoms from this. Re-evaluate echo but favor rate control given comorbidities. He has done well with a conservative approach. Continue eliquis. Cirrhosis noted, but no bleeding.     CAD HLD -obstructive D2 managed medically. No ASA as on eliquis. Continue lipitor. Recheck lipids today.   Chronic Kidney Disease (CKD) Stage 3B Slight increase in kidney function from last year, but stable since June. -Continue monitoring kidney function. -BMP today.   Hyperlipidemia On Lipitor 40mg , but no recent lipid profile in the past two years. -Order lipid profile.  Diabetes Mellitus Well controlled. -Continue current management.  Hypertension Well controlled on Amlodipine 10mg  daily and Hydralazine 50mg  TID. -Continue current management.  General Health Maintenance -Check potassium levels today. -Plan follow-up in six months or sooner if needed.              Follow-up: Return in about 6 months (around 11/18/2023).  Time Spent with Patient: I have spent a total of 35 minutes caring for this patient today face to face, ordering and reviewing labs/tests, reviewing prior records/medical history, examining the patient, establishing an assessment and plan, communicating results/findings to the patient/family, and documenting in the medical record.   Signed, Lenna Gilford. Flora Lipps, MD, [facility]  [facility]  [address] [place], [place] [address] [phone]  10:10 AM

## 2023-05-21 ENCOUNTER — Encounter: Payer: Self-pay | Admitting: Cardiovascular Disease

## 2023-05-21 ENCOUNTER — Ambulatory Visit: Payer: Medicare HMO | Attending: Cardiovascular Disease | Admitting: Cardiovascular Disease

## 2023-05-21 VITALS — BP 130/70 | HR 95 | Ht 66.0 in | Wt 157.0 lb

## 2023-05-21 DIAGNOSIS — I2510 Atherosclerotic heart disease of native coronary artery without angina pectoris: Secondary | ICD-10-CM

## 2023-05-21 DIAGNOSIS — I10 Essential (primary) hypertension: Secondary | ICD-10-CM | POA: Diagnosis not present

## 2023-05-21 DIAGNOSIS — I4819 Other persistent atrial fibrillation: Secondary | ICD-10-CM

## 2023-05-21 DIAGNOSIS — I951 Orthostatic hypotension: Secondary | ICD-10-CM

## 2023-05-21 DIAGNOSIS — I421 Obstructive hypertrophic cardiomyopathy: Secondary | ICD-10-CM

## 2023-05-21 DIAGNOSIS — I5022 Chronic systolic (congestive) heart failure: Secondary | ICD-10-CM

## 2023-05-21 DIAGNOSIS — E782 Mixed hyperlipidemia: Secondary | ICD-10-CM

## 2023-05-21 DIAGNOSIS — I442 Atrioventricular block, complete: Secondary | ICD-10-CM

## 2023-05-21 NOTE — Patient Instructions (Signed)
Medication Instructions:  NO CHANGES  *If you need a refill on your cardiac medications before your next appointment, please call your pharmacy*   Lab Work: Lipid Panel and BMET today   If you have labs (blood work) drawn today and your tests are completely normal, you will receive your results only by: MyChart Message (if you have MyChart) OR A paper copy in the mail If you have any lab test that is abnormal or we need to change your treatment, we will call you to review the results.   Testing/Procedures: Your physician has requested that you have an echocardiogram. Echocardiography is a painless test that uses sound waves to create images of your heart. It provides your doctor with information about the size and shape of your heart and how well your heart's chambers and valves are working. This procedure takes approximately one hour. There are no restrictions for this procedure. Please do NOT wear cologne, perfume, aftershave, or lotions (deodorant is allowed). Please arrive 15 minutes prior to your appointment time.  Please note: We ask at that you not bring children with you during ultrasound (echo/ vascular) testing. Due to room size and safety concerns, children are not allowed in the ultrasound rooms during exams. Our front office staff cannot provide observation of children in our lobby area while testing is being conducted. An adult accompanying a patient to their appointment will only be allowed in the ultrasound room at the discretion of the ultrasound technician under special circumstances. We apologize for any inconvenience.    Follow-Up: At [facility], you and your health needs are our priority.  As part of our continuing mission to provide you with exceptional heart care, we have created designated Provider Care Teams.  These Care Teams include your primary Cardiologist (physician) and Advanced Practice Providers (APPs -  Physician Assistants and Nurse  Practitioners) who all work together to provide you with the care you need, when you need it.  We recommend signing up for the patient portal called "MyChart".  Sign up information is provided on this After Visit Summary.  MyChart is used to connect with patients for Virtual Visits (Telemedicine).  Patients are able to view lab/test results, encounter notes, upcoming appointments, etc.  Non-urgent messages can be sent to your provider as well.   To learn more about what you can do with MyChart, go to [email or web address].    Your next appointment:   6 months with Irving Burton NP, Wynema Birch PA, or [place] PA  12 months with Dr. Flora Lipps

## 2023-05-22 LAB — BASIC METABOLIC PANEL
BUN/Creatinine Ratio: 20 (ref 10–24)
BUN: 31 mg/dL — ABNORMAL HIGH (ref 8–27)
CO2: 21 mmol/L (ref 20–29)
Calcium: 10 mg/dL (ref 8.6–10.2)
Chloride: 102 mmol/L (ref 96–106)
Creatinine, Ser: 1.58 mg/dL — ABNORMAL HIGH (ref 0.76–1.27)
Glucose: 120 mg/dL — ABNORMAL HIGH (ref 70–99)
Potassium: 3.5 mmol/L (ref 3.5–5.2)
Sodium: 142 mmol/L (ref 134–144)
eGFR: 44 mL/min/{1.73_m2} — ABNORMAL LOW (ref >59)

## 2023-05-22 LAB — LIPID PANEL
Chol/HDL Ratio: 2.1 ratio (ref 0.0–5.0)
Cholesterol, Total: 107 mg/dL (ref 100–199)
HDL: 51 mg/dL (ref >39)
LDL Chol Calc (NIH): 37 mg/dL (ref 0–99)
Triglycerides: 104 mg/dL (ref 0–149)
VLDL Cholesterol Cal: 19 mg/dL (ref 5–40)

## 2023-05-28 ENCOUNTER — Other Ambulatory Visit: Payer: Self-pay

## 2023-05-28 MED ORDER — FUROSEMIDE 40 MG PO TABS: 40.0000 mg | ORAL_TABLET | Freq: Every day | ORAL | 3 refills | Status: DC

## 2023-06-19 ENCOUNTER — Emergency Department (HOSPITAL_COMMUNITY): Payer: Medicare HMO

## 2023-06-19 ENCOUNTER — Encounter (HOSPITAL_COMMUNITY): Payer: Self-pay | Admitting: Emergency Medicine

## 2023-06-19 ENCOUNTER — Observation Stay (HOSPITAL_COMMUNITY)
Admission: EM | Admit: 2023-06-19 | Discharge: 2023-06-22 | Disposition: A | Discharge status: Home / Self Care | Payer: Medicare HMO | Attending: Emergency Medicine | Admitting: Emergency Medicine

## 2023-06-19 ENCOUNTER — Other Ambulatory Visit: Payer: Self-pay

## 2023-06-19 DIAGNOSIS — N1832 Chronic kidney disease, stage 3b: Secondary | ICD-10-CM | POA: Diagnosis not present

## 2023-06-19 DIAGNOSIS — J40 Bronchitis, not specified as acute or chronic: Secondary | ICD-10-CM | POA: Insufficient documentation

## 2023-06-19 DIAGNOSIS — Z1152 Encounter for screening for COVID-19: Secondary | ICD-10-CM | POA: Diagnosis not present

## 2023-06-19 DIAGNOSIS — I5023 Acute on chronic systolic (congestive) heart failure: Secondary | ICD-10-CM

## 2023-06-19 DIAGNOSIS — Z7984 Long term (current) use of oral hypoglycemic drugs: Secondary | ICD-10-CM | POA: Diagnosis not present

## 2023-06-19 DIAGNOSIS — Z7901 Long term (current) use of anticoagulants: Secondary | ICD-10-CM | POA: Diagnosis not present

## 2023-06-19 DIAGNOSIS — I272 Pulmonary hypertension, unspecified: Secondary | ICD-10-CM | POA: Diagnosis not present

## 2023-06-19 DIAGNOSIS — J9601 Acute respiratory failure with hypoxia: Secondary | ICD-10-CM | POA: Diagnosis not present

## 2023-06-19 DIAGNOSIS — Z87891 Personal history of nicotine dependence: Secondary | ICD-10-CM | POA: Diagnosis not present

## 2023-06-19 DIAGNOSIS — I5043 Acute on chronic combined systolic (congestive) and diastolic (congestive) heart failure: Secondary | ICD-10-CM | POA: Diagnosis not present

## 2023-06-19 DIAGNOSIS — I4819 Other persistent atrial fibrillation: Secondary | ICD-10-CM | POA: Diagnosis not present

## 2023-06-19 DIAGNOSIS — Z79899 Other long term (current) drug therapy: Secondary | ICD-10-CM | POA: Diagnosis not present

## 2023-06-19 DIAGNOSIS — I130 Hypertensive heart and chronic kidney disease with heart failure and stage 1 through stage 4 chronic kidney disease, or unspecified chronic kidney disease: Secondary | ICD-10-CM | POA: Diagnosis not present

## 2023-06-19 DIAGNOSIS — E1122 Type 2 diabetes mellitus with diabetic chronic kidney disease: Secondary | ICD-10-CM | POA: Insufficient documentation

## 2023-06-19 DIAGNOSIS — R2689 Other abnormalities of gait and mobility: Secondary | ICD-10-CM | POA: Insufficient documentation

## 2023-06-19 DIAGNOSIS — I2510 Atherosclerotic heart disease of native coronary artery without angina pectoris: Secondary | ICD-10-CM | POA: Diagnosis not present

## 2023-06-19 DIAGNOSIS — N179 Acute kidney failure, unspecified: Secondary | ICD-10-CM | POA: Diagnosis not present

## 2023-06-19 DIAGNOSIS — R0602 Shortness of breath: Secondary | ICD-10-CM | POA: Diagnosis present

## 2023-06-19 LAB — CBC WITH DIFFERENTIAL/PLATELET
Abs Immature Granulocytes: 0.02 10*3/uL (ref 0.00–0.07)
Basophils Absolute: 0.1 10*3/uL (ref 0.0–0.1)
Basophils Relative: 1 %
Eosinophils Absolute: 0.2 10*3/uL (ref 0.0–0.5)
Eosinophils Relative: 1 %
HCT: 36.3 % — ABNORMAL LOW (ref 39.0–52.0)
Hemoglobin: 12.3 g/dL — ABNORMAL LOW (ref 13.0–17.0)
Immature Granulocytes: 0 %
Lymphocytes Relative: 9 %
Lymphs Abs: 1 10*3/uL (ref 0.7–4.0)
MCH: 32.5 pg (ref 26.0–34.0)
MCHC: 33.9 g/dL (ref 30.0–36.0)
MCV: 96 fL (ref 80.0–100.0)
Monocytes Absolute: 1.4 10*3/uL — ABNORMAL HIGH (ref 0.1–1.0)
Monocytes Relative: 13 %
Neutro Abs: 8.2 10*3/uL — ABNORMAL HIGH (ref 1.7–7.7)
Neutrophils Relative %: 76 %
Platelets: 117 10*3/uL — ABNORMAL LOW (ref 150–400)
RBC: 3.78 MIL/uL — ABNORMAL LOW (ref 4.22–5.81)
RDW: 12.9 % (ref 11.5–15.5)
Smear Review: NORMAL
WBC: 10.9 10*3/uL — ABNORMAL HIGH (ref 4.0–10.5)
nRBC: 0 % (ref 0.0–0.2)

## 2023-06-19 LAB — COMPREHENSIVE METABOLIC PANEL
ALT: 10 U/L (ref 0–44)
AST: 15 U/L (ref 15–41)
Albumin: 3.9 g/dL (ref 3.5–5.0)
Alkaline Phosphatase: 46 U/L (ref 38–126)
Anion gap: 16 — ABNORMAL HIGH (ref 5–15)
BUN: 21 mg/dL (ref 8–23)
CO2: 22 mmol/L (ref 22–32)
Calcium: 9.5 mg/dL (ref 8.9–10.3)
Chloride: 103 mmol/L (ref 98–111)
Creatinine, Ser: 1.64 mg/dL — ABNORMAL HIGH (ref 0.61–1.24)
GFR, Estimated: 42 mL/min — ABNORMAL LOW (ref >60)
Glucose, Bld: 113 mg/dL — ABNORMAL HIGH (ref 70–99)
Potassium: 2.7 mmol/L — CL (ref 3.5–5.1)
Sodium: 141 mmol/L (ref 135–145)
Total Bilirubin: 1.2 mg/dL — ABNORMAL HIGH (ref <1.2)
Total Protein: 7 g/dL (ref 6.5–8.1)

## 2023-06-19 LAB — BASIC METABOLIC PANEL
Anion gap: 12 (ref 5–15)
BUN: 19 mg/dL (ref 8–23)
CO2: 27 mmol/L (ref 22–32)
Calcium: 9.5 mg/dL (ref 8.9–10.3)
Chloride: 100 mmol/L (ref 98–111)
Creatinine, Ser: 1.75 mg/dL — ABNORMAL HIGH (ref 0.61–1.24)
GFR, Estimated: 39 mL/min — ABNORMAL LOW (ref >60)
Glucose, Bld: 211 mg/dL — ABNORMAL HIGH (ref 70–99)
Potassium: 3.3 mmol/L — ABNORMAL LOW (ref 3.5–5.1)
Sodium: 139 mmol/L (ref 135–145)

## 2023-06-19 LAB — SARS CORONAVIRUS 2 BY RT PCR: SARS Coronavirus 2 by RT PCR: NEGATIVE

## 2023-06-19 LAB — BRAIN NATRIURETIC PEPTIDE: B Natriuretic Peptide: 323.7 pg/mL — ABNORMAL HIGH (ref 0.0–100.0)

## 2023-06-19 LAB — TROPONIN I (HIGH SENSITIVITY)
Troponin I (High Sensitivity): 17 ng/L (ref <18)
Troponin I (High Sensitivity): 17 ng/L (ref <18)

## 2023-06-19 LAB — GLUCOSE, CAPILLARY
Glucose-Capillary: 169 mg/dL — ABNORMAL HIGH (ref 70–99)
Glucose-Capillary: 122 mg/dL — ABNORMAL HIGH (ref 70–99)

## 2023-06-19 LAB — D-DIMER, QUANTITATIVE: D-Dimer, Quant: 1.57 ug{FEU}/mL — ABNORMAL HIGH (ref 0.00–0.50)

## 2023-06-19 MED ORDER — FUROSEMIDE 10 MG/ML IJ SOLN
40.0000 mg | Freq: Once | INTRAMUSCULAR | Status: AC
  Administered 2023-06-19: 40 mg via INTRAVENOUS
  Filled 2023-06-19: qty 4

## 2023-06-19 MED ORDER — IOHEXOL 350 MG/ML SOLN
75.0000 mL | Freq: Once | INTRAVENOUS | Status: AC | PRN
  Administered 2023-06-19: 75 mL via INTRAVENOUS

## 2023-06-19 MED ORDER — ALLOPURINOL 100 MG PO TABS
100.0000 mg | ORAL_TABLET | Freq: Every day | ORAL | Status: DC
  Administered 2023-06-20 – 2023-06-22 (×3): 100 mg via ORAL
  Filled 2023-06-19 (×4): qty 1

## 2023-06-19 MED ORDER — POTASSIUM CHLORIDE CRYS ER 20 MEQ PO TBCR
40.0000 meq | EXTENDED_RELEASE_TABLET | Freq: Once | ORAL | Status: AC
  Administered 2023-06-19: 40 meq via ORAL
  Filled 2023-06-19: qty 2

## 2023-06-19 MED ORDER — ALBUTEROL SULFATE (2.5 MG/3ML) 0.083% IN NEBU: 3.0000 mL | INHALATION_SOLUTION | Freq: Four times a day (QID) | RESPIRATORY_TRACT | Status: DC | PRN

## 2023-06-19 MED ORDER — AMLODIPINE BESYLATE 10 MG PO TABS
10.0000 mg | ORAL_TABLET | Freq: Every day | ORAL | Status: DC
Start: 2023-06-20 — End: 2023-06-22
  Administered 2023-06-20 – 2023-06-22 (×3): 10 mg via ORAL
  Filled 2023-06-19 (×4): qty 1

## 2023-06-19 MED ORDER — POTASSIUM CHLORIDE CRYS ER 20 MEQ PO TBCR
20.0000 meq | EXTENDED_RELEASE_TABLET | Freq: Once | ORAL | Status: AC
  Administered 2023-06-19: 20 meq via ORAL
  Filled 2023-06-19: qty 1

## 2023-06-19 MED ORDER — HYDRALAZINE HCL 50 MG PO TABS
50.0000 mg | ORAL_TABLET | Freq: Three times a day (TID) | ORAL | Status: DC
  Administered 2023-06-19 – 2023-06-22 (×9): 50 mg via ORAL
  Filled 2023-06-19 (×9): qty 1

## 2023-06-19 MED ORDER — APIXABAN 5 MG PO TABS
5.0000 mg | ORAL_TABLET | Freq: Two times a day (BID) | ORAL | Status: DC
  Administered 2023-06-19 – 2023-06-22 (×6): 5 mg via ORAL
  Filled 2023-06-19 (×6): qty 1

## 2023-06-19 MED ORDER — INSULIN ASPART 100 UNIT/ML IJ SOLN
0.0000 [IU] | Freq: Three times a day (TID) | INTRAMUSCULAR | Status: DC
  Administered 2023-06-19: 2 [IU] via SUBCUTANEOUS
  Administered 2023-06-20: 3 [IU] via SUBCUTANEOUS
  Administered 2023-06-20 – 2023-06-21 (×2): 2 [IU] via SUBCUTANEOUS
  Administered 2023-06-21 – 2023-06-22 (×2): 3 [IU] via SUBCUTANEOUS
  Administered 2023-06-22: 2 [IU] via SUBCUTANEOUS

## 2023-06-19 MED ORDER — ATORVASTATIN CALCIUM 40 MG PO TABS
40.0000 mg | ORAL_TABLET | Freq: Every day | ORAL | Status: DC
Start: 2023-06-20 — End: 2023-06-22
  Administered 2023-06-20 – 2023-06-22 (×3): 40 mg via ORAL
  Filled 2023-06-19 (×4): qty 1

## 2023-06-19 NOTE — Plan of Care (Signed)

## 2023-06-19 NOTE — ED Provider Notes (Signed)
[place] EMERGENCY DEPARTMENT AT [facility] Provider Note  CSN: [id number] Arrival date & time: 06/19/23 4098  Chief Complaint(s) Shortness of Breath (Throat congestion)  HPI Dylan [name] is a 77 y.o. male history of CHF, coronary artery disease, CKD, diabetes presenting to the emergency department with shortness of breath.  He reports that he began feeling short of breath last night.  He reports that he believes that is related to his throat.  He has had a sore throat for about 8 months.  He reports he is seeing ENT doctor for this and no one's been able to help him.  He reports otherwise he has been feeling okay, no fevers or chills, no cough, no leg swelling, no chest pain, no abdominal pain.  He is compliant with his other medications.  He checks his oxygen level at home and it was in the 80s so he came to the hospital.   Past Medical History Past Medical History:  Diagnosis Date  . Anemia 10/18/2014   Baseline about 12 and stable from 2010 to 2016. Colon 2009 in [place] (records cannot be obtained).  EGD Dr Elnoria Howard 2011 nl   . Aortic atherosclerosis ([name]) 03/28/2020   Incidental finding on imaging CT   . Atherosclerosis 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 ([name]) 12/28/2014   ABI Feb 2017 R 0.49; L 0.72 with diffuse dz ABI Aug 2017 R 0.49; L 0.69 ABI Jan 2018 R 0.61; L 0.73  ABI Feb 2019 R 0.55; L 0.71 Sees Dr Imogene Burn - recs ABI q 6 months  . 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 Grade 2. EF 50-55%; echo repeated 2019, severe hypertrophy with elevated filling pressures  . Chronic kidney disease, stage 3b ([name]) 03/09/2020   Dr. Hyman Hopes  Nephrologist, f/u Q 87M  . 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 Grade 2. EF 50-55%; echo repeated 2019, severe hypertrophy with elevated filling pressures  Repeat ECHO 08/04/21  showed EF 30-35%, severely elevated PASP, severely reduced RVSF, severely dilated LA and RA, small pericardial effusion    . Coronary artery disease without angina pectoris   . Degloving injury of finger 03/28/2020   10/21/19: copied from op note "1.  Open reduction percutaneous pinning left small finger proximal phalanx fracture 2.  Complex repair of laceration to left small finger 3 cm in length 3.  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 No significant callus formation is noted. Pins had been removed  . Diverticulosis 10/08/2014   Seen on CT. Reportedly on Colon in [place] in 2009. Freq bouts of diverticulitis.  . Dizziness due to orthostatic hypotensioni in setting of wt loss, resolved 01/12/2021  . DM (diabetes mellitus), type 2 with renal complications ([name]) 10/09/2014  . Former tobacco use 10/08/2014  . Gout   . H/O gastrointestinal diverticular hemorrhage 11/21/2020  . Hx of recent orthostatic hypotension (early summer 2022) 11/28/2020  . 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 Stable from 2010 to 2016.  Negative SPEP and UPEP 2014 after ARF 2/2 continued ACE (lisinopril 20) use while vol contracted. Dr Hyman Hopes  . Hypertrophic obstructive cardiomyopathy ([name]) 08/05/2021  . Obesity (BMI 30.0-34.9) 03/09/2020  . 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 OSA (obstructive sleep apnea) 10/08/2014  July 2016 : Severe OSA/hypopnea syndrome, AHI 128.4, O2 nadir of 84% RA. Failed CPAP on study. BiPA inspiratory pressure of 21 and expiratory pressure of 17 CWP. Consider ENT evaluation for potentially correctable upper airway obstruction contributing to the need for unusually high pressures - ENT July 2015 did not feel any intervention surgically was indicated. He wore a large F&P Simplus fullfac  . Personal history of colonic polyps 03/28/2014   Dr.  Elnoria Howard, 2 polyps removed 2015.  No polyps on f/u in 2020.  No further surveillance suggested per Dr. Elnoria Howard.  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  He is unable to breathe through his nose comfortably.  This is part of the reason why he does not wear his oxygen regularly.  Nasal passages are nearly completely obstructed erythematous smooth glistening surface.  He has been told by his eye doctor that part of the reason his e  . Resistant hypertension 10/09/2014   Poor control with 6 drug therapy. 2016 : Aldo 37 but ARR 23.5   . Severe pulmonary arterial systolic hypertension ([name]) 10/18/2014   Noted as severe ECHO 2016 and 2019. Likely 2/2 severe untreated OSA. Pt is not adherent to CPAP.  Marland Kitchen Stage 3b chronic kidney disease (CKD) ([name]) 03/09/2020   Dr. Hyman Hopes  Nephrologist, f/u Q 73M    . Syncope 12/31/2020  . Thrombocytopenia ([name]) 10/09/2016   Nl liver and spleen on [place] 2019  . Ulcer aphthous oral 04/27/2020   Evaluated emergency room last week, saw with Magic mouthwash  . Unintentional weight loss due to Trulicity, resolved 11/28/2020   Patient Active Problem List   Diagnosis Date Noted  . Acute hypoxic respiratory failure ([name]) 06/19/2023  . Acute on chronic combined systolic (congestive) and diastolic (congestive) heart failure ([name]) 05/18/2023  . Seborrheic keratoses 12/19/2022  . Phlegm in throat 06/20/2022  . Umbilical hernia without obstruction or gangrene 12/18/2021  . Pulmonary hypertension due to sleep-disordered breathing ([name]) 12/18/2021  . Persistent atrial fibrillation ([name]) 12/18/2021  . Hypercoagulable state due to persistent atrial fibrillation ([name]) 12/18/2021  . Hyperlipidemia 12/18/2021  . Iron deficiency anemia due to chronic blood loss 12/18/2021  . Cirrhosis of liver ([name]) 10/18/2021  . 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 Subareolar gynecomastia in male due to spironolactone, resolved 04/09/2021  . Secondary hyperparathyroidism of renal origin ([name]) 01/25/2021  . RBBB 11/23/2020  . History of GI diverticular bleed 11/21/2020  . History of BPH 09/14/2020  . Healthcare maintenance 09/13/2020  . Urge urinary incontinence 06/29/2020  . Refractory obstruction of nasal airway 04/27/2020  . Aortic atherosclerosis ([name]) 03/28/2020  . Stage 3b chronic kidney disease (CKD) ([name]) 03/09/2020  . Coronary artery disease without angina pectoris 04/04/2018  . Thrombocytopenia ([name]) 10/09/2016  . 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 Atherosclerosis 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 ([name]) 12/28/2014  . Gout 10/19/2014  . 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 Resistant hypertension (hyperaldosteronism) 10/09/2014  . OSA (obstructive sleep apnea) 10/08/2014  . Former tobacco use 10/08/2014  . History of colonic polyps 03/28/2014  . DM (diabetes mellitus), type 2 with renal complications ([name]) 10/08/1993   Home Medication(s) Prior to Admission medications   Medication Sig Start Date End Date Taking? Authorizing Provider  albuterol (VENTOLIN HFA) 108 (90 Base) MCG/ACT inhaler Inhale into the lungs every 6 (six) hours as needed for wheezing or shortness of breath.    [provider]  allopurinol (ZYLOPRIM) 100 MG tablet Take 1 tablet (100 mg total) by mouth daily. 01/03/23   Miguel Aschoff, MD  amLODipine (NORVASC) 10 MG tablet Take 1 tablet (10 mg total) by mouth daily. 11/25/22   Miguel Aschoff, MD  apixaban (ELIQUIS) 5 MG TABS tablet Take 1 tablet (5 mg total) by mouth 2 (two) times daily. 12/27/22   Miguel Aschoff, MD  atorvastatin (LIPITOR) 40 MG tablet Take 1 tablet (40 mg total) by mouth daily. 03/04/23   Miguel Aschoff, MD  eplerenone (INSPRA) 50 MG tablet Take 1 tablet (50 mg total) by mouth daily. 02/20/23   Miguel Aschoff, MD  fluticasone (FLONASE) 50 MCG/ACT nasal spray Place 1 spray into both nostrils daily. 09/13/22 09/13/23  Miguel Aschoff, MD  FREESTYLE LITE test strip 1 each by Other route in the morning. 11/11/22   Miguel Aschoff, MD  furosemide (LASIX) 40 MG tablet Take 1 tablet (40 mg total) by mouth daily. 05/28/23 05/22/24  Miguel Aschoff, MD  hydrALAZINE (APRESOLINE) 50 MG tablet Take 1 tablet (50 mg total) by mouth 3 (three) times daily. 03/21/23   Miguel Aschoff, MD  latanoprost (XALATAN) 0.005 % ophthalmic solution Place 1 drop into both eyes at bedtime. 01/12/21   [provider]  metFORMIN (GLUCOPHAGE) 500 MG tablet Take 1 tablet (500 mg total) by mouth 2 (two) times daily with a meal. 01/22/23   Miguel Aschoff, MD  ofloxacin (OCUFLOX) 0.3 % ophthalmic solution  01/09/22   [provider]                                                                                                                                    Past Surgical History Past Surgical History:  Procedure Laterality Date  . BIOPSY  10/10/2021   Procedure: BIOPSY;  Surgeon: Jeani Hawking, MD;  Location: [facility] ENDOSCOPY;  Service: Gastroenterology;;  . CHOLECYSTECTOMY    . COLONOSCOPY WITH PROPOFOL N/A 02/12/2019   Procedure: COLONOSCOPY WITH PROPOFOL;  Surgeon: Jeani Hawking, MD;  Location: [facility] ENDOSCOPY;  Service: Endoscopy;  Laterality: N/A;  . ESOPHAGOGASTRODUODENOSCOPY (EGD) WITH PROPOFOL N/A 10/10/2021   Procedure: ESOPHAGOGASTRODUODENOSCOPY (EGD) WITH PROPOFOL;  Surgeon: Jeani Hawking, MD;  Location[facility] ENDOSCOPY;  Service: Gastroenterology;  Laterality: N/A;  . PERCUTANEOUS PINNING Left 10/21/2019   Procedure: CLOSED REDUCTION WITH PERCUTANEOUS PINNING AND SUTURE REPAIR OF LACERATION  OF SMALL FINGER,  SUTURE REPAIR OF LACERATION OF INDEX FINGER;  Surgeon: Allena Napoleon,  MD;  Location: [facility] OR;  Service: Plastics;  Laterality: Left;  . RIGHT/LEFT HEART CATH AND CORONARY ANGIOGRAPHY N/A 08/09/2021   Procedure: RIGHT/LEFT HEART CATH AND CORONARY ANGIOGRAPHY;  Surgeon: Lennette Bihari, MD;  Location: [facility] INVASIVE CV LAB;  Service: Cardiovascular;  Laterality: N/A;   Family History Family History  Problem Relation Age of Onset  . Heart failure Sister        Died of complications 03-Jun-2016 . CAD Sister        CABG x3 while in her 42's    Social History Social History   Tobacco Use  . Smoking status: Former    Current packs/day: 0.00    Average packs/day: 0.5 packs/day for 20.0 years (10.0 ttl pk-yrs)    Types: Cigarettes    Start date: 10/02/1952    Quit date: 10/02/1972    Years since quitting: 50.7  . Smokeless tobacco: Never  Vaping Use  . Vaping status: Never Used  Substance Use Topics  . Alcohol use: No    Alcohol/week: 0.0 standard drinks of alcohol  . Drug use: No   Allergies Ace inhibitors and Spironolactone  Review of Systems Review of Systems  All other systems reviewed and are negative.   Physical Exam Vital Signs  I have reviewed the triage vital signs BP (!) 158/77   Pulse 82   Temp 98.5 F (36.9 C) (Oral)   Resp 17   SpO2 95%  Physical Exam Vitals and nursing note reviewed.  Constitutional:      General: He is not in acute distress.    Appearance: Normal appearance.  HENT:     Mouth/Throat:     Mouth: Mucous membranes are moist.     Comments: Cobblestoning to posterior pharynx, relatively large uvula which is chronic, no angioedema, no uvular deviation, floor of mouth soft. Eyes:     Conjunctiva/sclera: Conjunctivae normal.  Neck:     Vascular: No JVD.  Cardiovascular:     Rate and Rhythm: Normal rate and regular rhythm.  Pulmonary:     Comments: No respiratory distress, mild increased work of breathing, trace bibasilar crackles, not speaking in full sentences Abdominal:     General: Abdomen is flat.      Palpations: Abdomen is soft.     Tenderness: There is no abdominal tenderness.  Musculoskeletal:     Right lower leg: No edema.     Left lower leg: No edema.  Skin:    General: Skin is warm and dry.     Capillary Refill: Capillary refill takes less than 2 seconds.  Neurological:     Mental Status: He is alert and oriented to person, place, and time. Mental status is at baseline.  Psychiatric:        Mood and Affect: Mood normal.        Behavior: Behavior normal.     ED Results and Treatments Labs (all labs ordered are listed, but only abnormal results are displayed) Labs Reviewed  COMPREHENSIVE METABOLIC PANEL - Abnormal; Notable for the following components:      Result Value   Potassium 2.7 (*)    Glucose, Bld 113 (*)    Creatinine, Ser 1.64 (*)    Total Bilirubin 1.2 (*)    GFR, Estimated 42 (*)    Anion gap 16 (*)    All other components within normal limits  CBC WITH DIFFERENTIAL/PLATELET - Abnormal; Notable for the following components:   WBC 10.9 (*)    RBC 3.78 (*)    Hemoglobin 12.3 (*)    HCT 36.3 (*)    Platelets 117 (*)    Neutro Abs 8.2 (*)    Monocytes Absolute 1.4 (*)    All other components within normal limits  BRAIN NATRIURETIC PEPTIDE - Abnormal; Notable for the following components:   B Natriuretic Peptide 323.7 (*)    All other components within normal limits  D-DIMER, QUANTITATIVE - Abnormal; Notable for the following components:   D-Dimer, Quant 1.57 (*)    All other components within normal limits  SARS CORONAVIRUS 2 BY RT PCR  TROPONIN I (HIGH SENSITIVITY)  TROPONIN I (HIGH SENSITIVITY)                                                                                                                          Radiology DG Chest 2 View Result Date: 06/19/2023 CLINICAL DATA:  Shortness of breath EXAM: CHEST - 2 VIEW COMPARISON:  10/09/2021 FINDINGS: Chronic cardiopericardial enlargement. Fissural thickening especially seen on the lateral view. No  Kerley lines or consolidation. Trace pleural fluid on the left. IMPRESSION: Cardiomegaly with mild fissure thickening and trace left pleural fluid, question early CHF. Electronically Signed   By: Tiburcio Pea M.D.   On: 06/19/2023 07:40    Pertinent labs & imaging results that were available during my care of the patient were reviewed by me and considered in my medical decision making (see MDM for details).  Medications Ordered in ED Medications  furosemide (LASIX) injection 40 mg (has no administration in time range)  potassium chloride SA (KLOR-CON M) CR tablet 20 mEq (has no administration in time range)  albuterol (PROVENTIL) (2.5 MG/3ML) 0.083% nebulizer solution 3 mL (has no administration in time range)  allopurinol (ZYLOPRIM) tablet 100 mg (has no administration in time range)  amLODipine (NORVASC) tablet 10 mg (has no administration in time range)  apixaban (ELIQUIS) tablet 5 mg (has no administration in time range)  atorvastatin (LIPITOR) tablet 40 mg (has no administration in time range)  hydrALAZINE (APRESOLINE) tablet 50 mg (has no administration in time range)  insulin aspart (novoLOG) injection 0-15 Units (has no administration in time range)  potassium chloride SA (KLOR-CON M) CR tablet 40 mEq (40 mEq Oral Given 06/19/23 0951)  iohexol (OMNIPAQUE) 350 MG/ML injection 75 mL (75 mLs Intravenous Contrast Given 06/19/23 1259)                                                                                                                                     Procedures .Critical Care  Performed by: Lonell Grandchild, MD Authorized by: Lonell Grandchild, MD   Critical  care provider statement:    Critical care time (minutes):  30   Critical care was necessary to treat or prevent imminent or life-threatening deterioration of the following conditions:  Respiratory failure   Critical care was time spent personally by me on the following activities:  Development of treatment  plan with patient or surrogate, discussions with consultants, evaluation of patient's response to treatment, examination of patient, ordering and review of laboratory studies, ordering and review of radiographic studies, ordering and performing treatments and interventions, pulse oximetry, re-evaluation of patient's condition and review of old charts   (including critical care time)  Medical Decision Making / ED Course   MDM:  77 year old male presenting to the emergency department with shortness of breath.  Patient here and hypoxic on room air.  Placed on oxygen with improvement.  Unclear of exact cause of patient's hypoxia, most likely cause probably CHF exacerbation however the patient does not appear floridly volume overloaded on exam.  No chest pain to suggest ACS but will check troponin.  Doubt pulmonary embolism as the patient is not having chest pain, compliant with chronic Eliquis but will check D-dimer.  No wheezing to suggest COPD exacerbation.  No pneumothorax on chest x-ray.  Chest x-ray has no pneumonia.  Does have findings of possible CHF although not overwhelming.  Labs with mild hypokalemia.  Will reassess.  Patient will need admission given hypoxia.  He was previously on oxygen per record review for unclear reason, however he has been off oxygen for months now and does not have any at home.  He does report chronic sore throat.  On exam, he has no evidence of any acute process.  He has some chronic appearing cobblestoning to his posterior pharynx.  I have an extremely low concern that this is related to his shortness of breath.  Clinical Course as of 06/19/23 1358  Thu Jun 19, 2023  1357 CT scan on my interpretation with no large central PE, signs of pulmonary edema.  Pending formal radiology read.  Discussed with the internal medicine team that takes care of him as an outpatient and they will admit the patient given his hypoxia. [WS]    Clinical Course User Index [WS]  Suezanne Jacquet, Jerilee Field, MD     Additional history obtained:  -External records from outside source obtained and reviewed including: Chart review including previous notes, labs, imaging, consultation notes including prior PMD and cardiology notes    Lab Tests: -I ordered, reviewed, and interpreted labs.   The pertinent results include:   Labs Reviewed  COMPREHENSIVE METABOLIC PANEL - Abnormal; Notable for the following components:      Result Value   Potassium 2.7 (*)    Glucose, Bld 113 (*)    Creatinine, Ser 1.64 (*)    Total Bilirubin 1.2 (*)    GFR, Estimated 42 (*)    Anion gap 16 (*)    All other components within normal limits  CBC WITH DIFFERENTIAL/PLATELET - Abnormal; Notable for the following components:   WBC 10.9 (*)    RBC 3.78 (*)    Hemoglobin 12.3 (*)    HCT 36.3 (*)    Platelets 117 (*)    Neutro Abs 8.2 (*)    Monocytes Absolute 1.4 (*)    All other components within normal limits  BRAIN NATRIURETIC PEPTIDE - Abnormal; Notable for the following components:   B Natriuretic Peptide 323.7 (*)    All other components within normal limits  D-DIMER, QUANTITATIVE - Abnormal; Notable  for the following components:   D-Dimer, Quant 1.57 (*)    All other components within normal limits  SARS CORONAVIRUS 2 BY RT PCR  TROPONIN I (HIGH SENSITIVITY)  TROPONIN I (HIGH SENSITIVITY)    Notable for hypokalemia , elevated BNP, elevated d-dimer  EKG   EKG Interpretation Date/Time:  Thursday June 19 2023 07:12:33 EST Ventricular Rate:  90 PR Interval:    QRS Duration:  146 QT Interval:  440 QTC Calculation: 538 R Axis:   -25  Text Interpretation: Atrial fibrillation Right bundle branch block Septal infarct , age undetermined T wave abnormality, consider lateral ischemia Abnormal ECG When compared with ECG of 21-May-2023 09:45, No significant change since last tracing Confirmed by Alvino Blood ([id number]) on 06/19/2023 9:12:09 AM         Imaging Studies  ordered: I ordered imaging studies including CXR On my interpretation imaging demonstrates pulmonary edema  I independently visualized and interpreted imaging. I agree with the radiologist interpretation   Medicines ordered and prescription drug management: Meds ordered this encounter  Medications  . potassium chloride SA (KLOR-CON M) CR tablet 40 mEq  . iohexol (OMNIPAQUE) 350 MG/ML injection 75 mL  . furosemide (LASIX) injection 40 mg  . potassium chloride SA (KLOR-CON M) CR tablet 20 mEq  . albuterol (PROVENTIL) (2.5 MG/3ML) 0.083% nebulizer solution 3 mL  . allopurinol (ZYLOPRIM) tablet 100 mg  . amLODipine (NORVASC) tablet 10 mg  . apixaban (ELIQUIS) tablet 5 mg  . atorvastatin (LIPITOR) tablet 40 mg  . hydrALAZINE (APRESOLINE) tablet 50 mg  . insulin aspart (novoLOG) injection 0-15 Units    Correction coverage::   Moderate (average weight, post-op)    CBG < 70::   Implement Hypoglycemia Standing Orders and refer to Hypoglycemia Standing Orders sidebar report    CBG 70 - 120::   0 units    CBG 121 - 150::   2 units    CBG 151 - 200::   3 units    CBG 201 - 250::   5 units    CBG 251 - 300::   8 units    CBG 301 - 350::   11 units    CBG 351 - 400::   15 units    CBG > 400:   call MD and obtain STAT lab verification    -I have reviewed the patients home medicines and have made adjustments as needed   Consultations Obtained: I requested consultation with the IM team,  and discussed lab and imaging findings as well as pertinent plan - they recommend: admission   Cardiac Monitoring: The patient was maintained on a cardiac monitor.  I personally viewed and interpreted the cardiac monitored which showed an underlying rhythm of: NSR  Social Determinants of Health:  Diagnosis or treatment significantly limited by social determinants of health: former smoker   Reevaluation: After the interventions noted above, I reevaluated the patient and found that their symptoms have  improved  Co morbidities that complicate the patient evaluation . Past Medical History:  Diagnosis Date  . Anemia 10/18/2014   Baseline about 12 and stable from 2010 to 2016. Colon 2009 in [place] (records cannot be obtained).  EGD Dr Elnoria Howard 2011 nl   . Aortic atherosclerosis ([name]) 03/28/2020   Incidental finding on imaging CT   . Atherosclerosis 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 ([name]) 12/28/2014   ABI Feb 2017 R 0.49; L 0.72 with diffuse dz ABI Aug 2017 R 0.49; L 0.69 ABI Jan 2018  R 0.61; L 0.73  ABI Feb 2019 R 0.55; L 0.71 Sees Dr Imogene Burn - recs ABI q 6 months  . 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 Grade 2. EF 50-55%; echo repeated 2019, severe hypertrophy with elevated filling pressures  . Chronic kidney disease, stage 3b ([name]) 03/09/2020   Dr. Hyman Hopes  Nephrologist, f/u Q 12M  . 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 Grade 2. EF 50-55%; echo repeated 2019, severe hypertrophy with elevated filling pressures  Repeat ECHO 08/04/21 showed EF 30-35%, severely elevated PASP, severely reduced RVSF, severely dilated LA and RA, small pericardial effusion    . Coronary artery disease without angina pectoris   . Degloving injury of finger 03/28/2020   10/21/19: copied from op note "1.  Open reduction percutaneous pinning left small finger proximal phalanx fracture 2.  Complex repair of laceration to left small finger 3 cm in length 3.  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 No significant callus formation is noted. Pins had been removed  . Diverticulosis 10/08/2014   Seen on CT. Reportedly on Colon in [place] in 2009. Freq bouts of diverticulitis.  . Dizziness due to orthostatic hypotensioni in setting of wt loss, resolved 01/12/2021   . DM (diabetes mellitus), type 2 with renal complications ([name]) 10/09/2014  . Former tobacco use 10/08/2014  . Gout   . H/O gastrointestinal diverticular hemorrhage 11/21/2020  . Hx of recent orthostatic hypotension (early summer 2022) 11/28/2020  . 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 Stable from 2010 to 2016.  Negative SPEP and UPEP 2014 after ARF 2/2 continued ACE (lisinopril 20) use while vol contracted. Dr Hyman Hopes  . Hypertrophic obstructive cardiomyopathy ([name]) 08/05/2021  . Obesity (BMI 30.0-34.9) 03/09/2020  . 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 OSA (obstructive sleep apnea) 10/08/2014   July 2016 : Severe OSA/hypopnea syndrome, AHI 128.4, O2 nadir of 84% RA. Failed CPAP on study. BiPA inspiratory pressure of 21 and expiratory pressure of 17 CWP. Consider ENT evaluation for potentially correctable upper airway obstruction contributing to the need for unusually high pressures - ENT July 2015 did not feel any intervention surgically was indicated. He wore a large F&P Simplus fullfac  . Personal history of colonic polyps 03/28/2014   Dr. Elnoria Howard, 2 polyps removed 2015.  No polyps on f/u in 2020.  No further surveillance suggested per Dr. Elnoria Howard.  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  He is unable to breathe through his nose comfortably.  This is part of the reason why he does not wear his oxygen regularly.  Nasal passages are nearly completely obstructed erythematous smooth glistening surface.  He has been told by his eye doctor that part of the reason his e  . Resistant hypertension 10/09/2014   Poor control with 6 drug therapy. 2016 : Aldo 37 but ARR 23.5   . Severe pulmonary arterial systolic hypertension ([name]) 10/18/2014   Noted as severe ECHO 2016 and 2019. Likely 2/2 severe untreated OSA. Pt is not  adherent to CPAP.  Marland Kitchen Stage 3b chronic kidney disease (CKD) ([name]) 03/09/2020   Dr. Hyman Hopes  Nephrologist, f/u Q 12M    .  Syncope 12/31/2020  . Thrombocytopenia ([name]) 10/09/2016   Nl liver and spleen on [place] 2019  . Ulcer aphthous oral 04/27/2020   Evaluated emergency room last week, saw with Magic mouthwash  . Unintentional weight loss due to Trulicity, resolved 11/28/2020      Dispostion: Disposition decision including need for hospitalization was considered, and patient admitted to the hospital.    Final Clinical Impression(s) / ED Diagnoses Final diagnoses:  Acute hypoxemic respiratory failure ([name])     This chart was dictated using voice recognition software.  Despite best efforts to proofread,  errors can occur which can change the documentation meaning.    Lonell Grandchild, MD 06/19/23 [phone]

## 2023-06-19 NOTE — H&P (Addendum)
Date: 06/19/2023               Patient Name:  Dylan [name] MRN: [id number]  DOB: January 02, 1946 Age / Sex: 77 y.o., male   PCP: Miguel Aschoff, MD         Medical Service: Internal Medicine Teaching Service         Attending Physician: Dr. Mercie Eon, MD      First Contact: Dr. Monna Fam, MD Pager [phone]    Second Contact: Dr. Rocky Morel, DO Pager [phone]         After Hours (After 5p/  First Contact Pager: [phone]  weekends / holidays): Second Contact Pager: [phone]   SUBJECTIVE   Chief Complaint: shortness of breath  History of Present Illness:  Patient is a 77 year old man with history significant for CHF d/t HOCM, persistent atrial fibrillation, HTN, CAD, CKD3b, T2DM, pulmonary hypertension, presenting with dyspnea. Patient reports last night he began feeling short of breath. He checked his O2 sat with a pulse oximeter, found to be in mid 80s. He attributes his symptoms to a sore throat he has had for about 8 months, which both his PCP and ENT have not been able to completely treat. He has otherwise been in his normal state of health. He notes a few episodes of diarrhea several days ago that resolved spontaneously. Denies chest pain, fevers/chills, recent illness, cough, abdominal pain, dietary indiscretion.   ED Course: Afeb, HDS, on 2L [place] WBC 10.9, Hgb 12.3 K 2.7, Cr 1.64 (approximately at baseline), anion gap 16 BNP 323 CXR: cardiomegaly, trace left pleural fluid CTA: no PE, mild cardiomegaly with mild pulmonary edema and trace bilateral pleural effusions  Past Medical History: Past Medical History:  Diagnosis Date   Anemia 10/18/2014   Baseline about 12 and stable from 2010 to 2016. Colon 2009 in [place] (records cannot be obtained).  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 Grade 2. EF 50-55%; echo repeated 2019, severe hypertrophy with elevated filling pressures   Chronic kidney disease, stage 3b ([name]) 03/09/2020   Dr. Hyman Hopes  Nephrologist, f/u Q 3M   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) 10/18/2014   Noted ECHO 10/2014. Grade 2. EF 50-55%; echo repeated 2019, severe hypertrophy with elevated filling pressures  Repeat ECHO 08/04/21 showed EF 30-35%, severely elevated PASP, severely reduced RVSF, severely dilated LA and RA, small pericardial effusion     Coronary artery disease without angina pectoris    Degloving injury of finger 03/28/2020   10/21/19: copied from op note "1.  Open reduction percutaneous pinning left small finger proximal phalanx fracture 2.  Complex repair of laceration to left small finger 3 cm in length 3.  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 No significant callus formation is noted. Pins had been removed   Diverticulosis 10/08/2014   Seen on CT. Reportedly on Colon  in [place] in 2009. Freq bouts of diverticulitis.   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 Stable from 2010 to 2016.  Negative SPEP and UPEP 2014 after ARF 2/2 continued ACE (lisinopril 20) use while vol  contracted. 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 Failed CPAP on study. BiPA inspiratory pressure of 21 and expiratory pressure of 17 CWP. Consider ENT evaluation for potentially correctable upper airway obstruction contributing to the need for unusually high pressures - ENT July 2015 did not feel any intervention surgically was indicated. He wore a large F&P Simplus fullfac   Personal history of colonic polyps 03/28/2014   Dr. Elnoria Howard, 2 polyps removed 2015.  No polyps on f/u in 2020.  No further surveillance suggested per Dr. Elnoria Howard.   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  He is unable to breathe through his nose comfortably.  This is part of the reason why he does not wear his oxygen regularly.  Nasal passages are nearly completely obstructed erythematous smooth glistening surface.  He has been told by his eye doctor that part of the reason his e   Resistant hypertension 10/09/2014   Poor control with 6 drug therapy. 2016 : Aldo 37 but ARR 23.5    Severe pulmonary arterial systolic hypertension ([name]) 10/18/2014   Noted as severe ECHO 2016 and 2019. Likely 2/2 severe untreated OSA. Pt is not adherent to CPAP.   Stage 3b chronic kidney disease (CKD) ([name]) 03/09/2020   Dr. Hyman Hopes  Nephrologist, f/u Q 76M     Syncope 12/31/2020   Thrombocytopenia ([name]) 10/09/2016   Nl liver and spleen on [place] 2019   Ulcer aphthous oral 04/27/2020   Evaluated emergency room last week, saw with Magic mouthwash   Unintentional weight loss due to Trulicity, resolved 11/28/2020    Meds:  Current Outpatient Medications  Medication Instructions   albuterol (VENTOLIN HFA) 108 (90  Base) MCG/ACT inhaler Inhalation, Every 6 hours PRN   allopurinol (ZYLOPRIM) 100 mg, Oral, Daily   amLODipine (NORVASC) 10 mg, Oral, Daily   apixaban (ELIQUIS) 5 mg, Oral, 2 times daily   atorvastatin (LIPITOR) 40 mg, Oral, Daily   eplerenone (INSPRA) 50 mg, Oral, Daily   fluticasone (FLONASE) 50 MCG/ACT nasal spray 1 spray, Each Nare, Daily   FREESTYLE LITE test strip 1 each, Other, Every morning   furosemide (LASIX) 40 mg, Oral, Daily   hydrALAZINE (APRESOLINE) 50 mg, Oral, 3 times daily   latanoprost (XALATAN) 0.005 % ophthalmic solution 1 drop, Both Eyes, Daily at bedtime   metFORMIN (GLUCOPHAGE) 500 mg, Oral, 2 times daily with meals   ofloxacin (OCUFLOX) 0.3 % ophthalmic solution No dose, route, or frequency recorded.    Past Surgical History:  Procedure Laterality Date   BIOPSY  10/10/2021   Procedure: BIOPSY;  Surgeon: Jeani Hawking,  MD;  Location: [facility] ENDOSCOPY;  Service: Gastroenterology;;   CHOLECYSTECTOMY     COLONOSCOPY WITH PROPOFOL N/A 02/12/2019   Procedure: COLONOSCOPY WITH PROPOFOL;  Surgeon: Jeani Hawking, MD;  Location: [facility] ENDOSCOPY;  Service: Endoscopy;  Laterality: N/A;   ESOPHAGOGASTRODUODENOSCOPY (EGD) WITH PROPOFOL N/A 10/10/2021   Procedure: ESOPHAGOGASTRODUODENOSCOPY (EGD) WITH PROPOFOL;  Surgeon: Jeani Hawking, MD;  Location: [facility] ENDOSCOPY;  Service: Gastroenterology;  Laterality: N/A;   PERCUTANEOUS PINNING Left 10/21/2019   Procedure: CLOSED REDUCTION WITH PERCUTANEOUS PINNING AND SUTURE REPAIR OF LACERATION  OF SMALL FINGER,  SUTURE REPAIR OF LACERATION OF INDEX FINGER;  Surgeon: Allena Napoleon, MD;  Location: [facility] OR;  Service: Plastics;  Laterality: Left;   RIGHT/LEFT HEART CATH AND CORONARY ANGIOGRAPHY N/A 08/09/2021   Procedure: RIGHT/LEFT HEART CATH AND CORONARY ANGIOGRAPHY;  Surgeon: Lennette Bihari, MD;  Location: [facility] INVASIVE CV LAB;  Service: Cardiovascular;  Laterality: N/A;   Social:  Living Situation: at home alone Occupation: none Level of Function:  independent in ADLs and iADLs, has multiple family members in [place] PCP: Miguel Aschoff, MD Tobacco: none Alcohol: none Drugs: none. 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   OBJECTIVE:   Physical Exam: Blood pressure (!) 158/77, pulse 82, temperature 98.5 F (36.9 C), temperature source Oral, resp. rate 17, SpO2 95%.  Constitutional: well-appearing, in no acute distress HENT: normocephalic atraumatic, mucous membranes moist Eyes: conjunctiva non-erythematous Neck: supple Cardiovascular: irregular rhythm, regular rate Pulmonary/Chest: normal work of breathing on 2L [place]. Slight bibasilar crackles Abdominal: soft, non-tender, non-distended MSK: normal bulk and tone. Nonedematous lower extremities Neurological: alert & oriented x 3, 5/5 strength in bilateral upper and lower extremities, normal gait Skin: warm and dry  Labs: CBC    Component Value Date/Time   WBC 10.9 (H) 06/19/2023 0836   RBC 3.78 (L) 06/19/2023 0836   HGB 12.3 (L) 06/19/2023 0836   HGB 13.9 09/12/2022 1129   HCT 36.3 (L) 06/19/2023 0836   HCT 42.4 09/12/2022 1129   PLT 117 (L) 06/19/2023 0836   PLT 136 (L) 09/12/2022 1129   MCV 96.0 06/19/2023 0836   MCV 96 09/12/2022 1129   MCH 32.5 06/19/2023 0836   MCHC 33.9 06/19/2023 0836   RDW 12.9 06/19/2023 0836   RDW 11.8 09/12/2022 1129   LYMPHSABS 1.0 06/19/2023 0836   MONOABS 1.4 (H) 06/19/2023 0836   EOSABS 0.2 06/19/2023 0836   BASOSABS 0.1 06/19/2023 0836     CMP     Component Value Date/Time   NA 141 06/19/2023 0836   NA 142 05/21/2023 1021   K 2.7 (LL) 06/19/2023 0836   CL 103 06/19/2023 0836   CO2 22 06/19/2023  0836   GLUCOSE 113 (H) 06/19/2023 0836   BUN 21 06/19/2023 0836   BUN 31 (H) 05/21/2023 1021   CREATININE 1.64 (H) 06/19/2023 0836   CREATININE 1.81 (H) 02/14/2021 1155   CREATININE 1.59 (H) 01/12/2015 1032   CALCIUM 9.5 06/19/2023 0836   PROT 7.0 06/19/2023 0836   PROT 7.8 06/20/2022 1139   ALBUMIN 3.9 06/19/2023 0836   ALBUMIN 4.8 06/20/2022 1139   AST 15 06/19/2023 0836   AST 12 (L) 02/14/2021 1155   ALT 10  06/19/2023 0836   ALT 12 02/14/2021 1155   ALKPHOS 46 06/19/2023 0836   BILITOT 1.2 (H) 06/19/2023 0836   BILITOT 0.6 06/20/2022 1139   BILITOT 0.4 02/14/2021 1155   GFRNONAA 42 (L) 06/19/2023 0836   GFRNONAA 38 (L) 02/14/2021 1155   GFRNONAA 43 (L) 01/12/2015 1032   GFRAA 36 (L) 06/29/2020 1030   GFRAA 50 (L) 01/12/2015 1032    Imaging: DG Chest 2 View Result Date: 06/19/2023 CLINICAL DATA:  Shortness of breath EXAM: CHEST - 2 VIEW COMPARISON:  10/09/2021 FINDINGS: Chronic cardiopericardial enlargement. Fissural thickening especially seen on the lateral view. No Kerley lines or consolidation. Trace pleural fluid on the left. IMPRESSION: Cardiomegaly with mild fissure thickening and trace left pleural fluid, question early CHF. Electronically Signed   By: Tiburcio Pea M.D.   On: 06/19/2023 07:40    ASSESSMENT & PLAN:   Assessment & Plan by Problem: Principal Problem:   Acute hypoxic respiratory failure ([name])   Dylan [name] is a 77 y.o. person living with a history of CHF d/t HOCM, persistent atrial fibrillation, HTN, CAD, CKD3b, T2DM, pulmonary hypertension, presenting with dyspnea.   #Acute hypoxic respiratory failure #HFrEF exacerbation Patient presenting with dyspnea, requiring 2L to maintain O2 sats, not currently on supplemental oxygen at home. Etiology is possibly due to acute exacerbation of CHF. BNP 323, CT showing mild pulmonary edema with trace pleural effusions. Patient does not appear overtly volume overloaded on exam. Lungs have slight bibasilar  crackles, no lower extremity edema, no JVD. Denies medication nonadherence or dietary indiscretion.  - s/p IV Lasix 40mg  - strict I&O - TTE - RVP, Covid pending  #Sore throat Patient reporting 8 months of sore throat. Unlikely to be the etiology of his dyspnea. Referral to ENT has been placed but it does not appear he has seen them yet. Unremarkable oral exam - RVP, Covid test pending  #Hypokalemia K 2.7 on admission, s/p Kcl - f/u PM BMP  #CKD3b Stable. Trend BMP  #CAD Continue home atorvastatin  #T2DM SSI, CBG monitoring  #Persistent Afib Continue home Eliquis  #HTN Continue home Hydralazine 50mg  TID, amlodipine 10mg   #Gout Continue home allopurinol   Diet: Carb modified VTE: Eliquis Code: Full  Prior to Admission Living Arrangement: Home Anticipated Discharge Location: pending PT/OT eval Barriers to Discharge: pending medical stability  Signed: Monna Fam, MD Internal Medicine Resident PGY-1  06/19/2023, 2:01 PM

## 2023-06-19 NOTE — ED Notes (Signed)
ED TO INPATIENT HANDOFF REPORT  ED Nurse Name and Phone #: Pattricia Weiher, RN [phone]  S Name/Age/Gender Robyn Haber 77 y.o. male Room/Bed: 033C/033C  Code Status   Code Status: Full Code  Home/SNF/Other Home Patient oriented to: self, place, time, and situation Is this baseline? Yes   Triage Complete: Triage complete  Chief Complaint Acute hypoxic respiratory failure ([name]) [J96.01]  Triage Note PT came in POV for SOB with a feeling of throat closing.  He feels like he is having to clear  his throat a lot to clear his airway. The throat issue has been going on for 8 months and he is followed by his PCP for this.  Feels worse since last night. The SOB is associated with throat issue.  Pt is speaking in full sentences but RA sat is 85% in triage. 98% on 2L.  Pt states his sats have been good at home. PT states he was told he had bronchitis.    Allergies Allergies  Allergen Reactions   Ace Inhibitors Other (See Comments)    "ARF - see CRF overview"   Spironolactone Other (See Comments)    Gynecomastia per pt report    Level of Care/Admitting Diagnosis ED Disposition     ED Disposition  Admit   Condition  --   Comment  Hospital [facility]: [facility] [[id number]]  Level of Care: Telemetry Medical [[id number]]  May place patient in observation at [facility] or [facility] if equivalent level of care is available:: Yes  Covid Evaluation: Symptomatic Person Under Investigation (PUI) or recent exposure (last 10 days) *Testing Required*  Diagnosis: Acute hypoxic respiratory failure [facility]) [[id number]]  Admitting Physician: Mercie Eon [[id number]]  Attending Physician: Mercie Eon [[id number]]          B Medical/Surgery History Past Medical History:  Diagnosis Date   Anemia 10/18/2014   Baseline about 12 and stable from 2010 to 2016. Colon 2009 in [place] (records cannot be obtained).  EGD Dr Elnoria Howard 2011 nl    Aortic atherosclerosis ([name]) 03/28/2020   Incidental finding on imaging CT     Atherosclerosis 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 ([name]) 12/28/2014   ABI Feb 2017 R 0.49; L 0.72 with diffuse dz ABI Aug 2017 R 0.49; L 0.69 ABI Jan 2018 R 0.61; L 0.73  ABI Feb 2019 R 0.55; L 0.71 Sees Dr Imogene Burn - recs ABI q 6 months   Chronic diastolic 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 ([name]) 10/18/2014   Noted ECHO 10/2014. Grade 2. EF 50-55%; echo repeated 2019, severe hypertrophy with elevated filling pressures   Chronic kidney disease, stage 3b ([name]) 03/09/2020   Dr. Hyman Hopes  Nephrologist, f/u Q 49M   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) 10/18/2014   Noted ECHO 10/2014. Grade 2. EF 50-55%; echo repeated 2019, severe hypertrophy with elevated filling pressures  Repeat ECHO 08/04/21 showed EF 30-35%, severely elevated PASP, severely reduced RVSF, severely dilated LA and RA, small pericardial effusion     Coronary artery disease without angina pectoris    Degloving injury of finger 03/28/2020   10/21/19: copied from op note "1.  Open reduction percutaneous pinning left small finger proximal phalanx fracture 2.  Complex repair of laceration to left small finger 3 cm in length 3.  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 No significant callus formation is noted. Pins had  been removed   Diverticulosis 10/08/2014   Seen on CT. Reportedly on Colon in [place] in 2009. Freq bouts of diverticulitis.   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 Stable from 2010 to 2016.  Negative SPEP and UPEP 2014 after ARF 2/2 continued ACE (lisinopril 20) use while vol contracted. 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 Failed CPAP on study. BiPA inspiratory pressure of 21 and expiratory pressure of 17 CWP. Consider ENT evaluation for potentially correctable upper airway obstruction contributing to the need for unusually high pressures - ENT July 2015 did not feel any intervention surgically was indicated. He wore a large F&P Simplus fullfac   Personal history of colonic polyps 03/28/2014   Dr. Elnoria Howard, 2 polyps removed 2015.  No polyps on f/u in 2020.  No further surveillance suggested per Dr. Elnoria Howard.   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  He is unable to breathe through his nose comfortably.  This is part of the reason why he does not wear his oxygen regularly.  Nasal passages are nearly completely obstructed erythematous smooth glistening surface.  He has been told by his eye doctor that part of the reason his e   Resistant hypertension 10/09/2014   Poor control with 6 drug therapy. 2016 : Aldo 37 but ARR 23.5    Severe pulmonary arterial systolic hypertension ([name]) 10/18/2014   Noted as severe ECHO 2016 and 2019. Likely 2/2 severe untreated OSA. Pt is not adherent to CPAP.   Stage 3b chronic kidney disease (CKD) ([name]) 03/09/2020   Dr. Hyman Hopes  Nephrologist, f/u Q 64M     Syncope 12/31/2020   Thrombocytopenia ([name]) 10/09/2016   Nl liver and spleen on [place] 2019   Ulcer aphthous oral 04/27/2020   Evaluated emergency room last week, saw with Magic mouthwash   Unintentional weight loss due to Trulicity,  resolved 11/28/2020   Past Surgical History:  Procedure Laterality Date   BIOPSY  10/10/2021   Procedure: BIOPSY;  Surgeon: Jeani Hawking, MD;  Location: [facility] ENDOSCOPY;  Service: Gastroenterology;;   CHOLECYSTECTOMY     COLONOSCOPY WITH PROPOFOL N/A 02/12/2019   Procedure: COLONOSCOPY WITH PROPOFOL;  Surgeon: Jeani Hawking, MD;  Location: [facility] ENDOSCOPY;  Service: Endoscopy;  Laterality: N/A;   ESOPHAGOGASTRODUODENOSCOPY (EGD) WITH PROPOFOL N/A 10/10/2021   Procedure: ESOPHAGOGASTRODUODENOSCOPY (EGD) WITH PROPOFOL;  Surgeon: Jeani Hawking, MD;  Location: [facility] ENDOSCOPY;  Service: Gastroenterology;  Laterality: N/A;   PERCUTANEOUS PINNING Left 10/21/2019   Procedure: CLOSED REDUCTION WITH PERCUTANEOUS PINNING AND SUTURE REPAIR OF LACERATION  OF SMALL FINGER,  SUTURE REPAIR OF LACERATION OF INDEX FINGER;  Surgeon: Allena Napoleon, MD;  Location: [facility] OR;  Service: Plastics;  Laterality: Left;   RIGHT/LEFT HEART CATH AND CORONARY ANGIOGRAPHY N/A 08/09/2021   Procedure: RIGHT/LEFT HEART CATH AND CORONARY ANGIOGRAPHY;  Surgeon: Lennette Bihari, MD;  Location: [facility] INVASIVE CV LAB;  Service: Cardiovascular;  Laterality: N/A;     A IV Location/Drains/Wounds Patient Lines/Drains/Airways Status     Active Line/Drains/Airways     Name Placement date Placement time Site Days   Peripheral IV 06/19/23 20 G Anterior;Distal;Right;Upper Arm 06/19/23  0840  Arm  less than 1   Sheath 08/09/21 Right Femoral;Venous 08/09/21  0952  Femoral;Venous  679            Intake/Output Last 24 hours No intake or output data in the 24 hours ending 06/19/23 1446  Labs/Imaging Results for orders placed or performed during the hospital encounter of 06/19/23 (from the past 48 hours)  Comprehensive metabolic panel     Status: Abnormal   Collection Time: 06/19/23  8:36 AM  Result Value Ref Range   Sodium 141 135 - 145 mmol/L   Potassium 2.7 (LL) 3.5 - 5.1 mmol/L    Comment: CRITICAL RESULT CALLED TO, READ BACK BY AND VERIFIED  WITH C.GROSE,RN 4696 06/19/23 CLARK,S   Chloride 103 98 - 111 mmol/L   CO2 22 22 - 32 mmol/L   Glucose, Bld 113 (H) 70 - 99 mg/dL    Comment: Glucose reference range applies only to samples taken after fasting for at least 8 hours.   BUN 21 8 - 23 mg/dL   Creatinine, Ser 2.95 (H) 0.61 - 1.24 mg/dL   Calcium 9.5 8.9 - 28.4 mg/dL   Total Protein 7.0 6.5 - 8.1 g/dL   Albumin 3.9 3.5 - 5.0 g/dL   AST 15 15 - 41 U/L   ALT 10 0 - 44 U/L   Alkaline Phosphatase 46 38 - 126 U/L   Total Bilirubin 1.2 (H) <1.2 mg/dL   GFR, Estimated 42 (L) >60 mL/min    Comment: (NOTE) Calculated using the CKD-EPI Creatinine Equation (2021)    Anion gap 16 (H) 5 - 15    Comment: Performed at [facility], [address] [place], [place] [address]  CBC with Differential     Status: Abnormal   Collection Time: 06/19/23  8:36 AM  Result Value Ref Range   WBC 10.9 (H) 4.0 - 10.5 K/uL    Comment: WHITE COUNT CONFIRMED ON SMEAR   RBC 3.78 (L) 4.22 - 5.81 MIL/uL   Hemoglobin 12.3 (L) 13.0 - 17.0 g/dL   HCT 01.0 (L) 27.2 - 53.6 %   MCV 96.0 80.0 - 100.0 fL   MCH 32.5 26.0 - 34.0 pg   MCHC 33.9 30.0 - 36.0 g/dL   RDW 64.4 03.4 - 74.2 %   Platelets 117 (L) 150 - 400 K/uL    Comment: REPEATED TO VERIFY PLATELET COUNT CONFIRMED BY SMEAR    nRBC 0.0 0.0 - 0.2 %   Neutrophils Relative % 76 %   Neutro Abs 8.2 (H) 1.7 - 7.7 K/uL   Lymphocytes Relative 9 %   Lymphs Abs 1.0 0.7 - 4.0 K/uL   Monocytes Relative 13 %   Monocytes Absolute 1.4 (H) 0.1 - 1.0 K/uL   Eosinophils Relative 1 %   Eosinophils Absolute 0.2 0.0 - 0.5 K/uL   Basophils Relative 1 %   Basophils Absolute 0.1 0.0 - 0.1 K/uL   WBC Morphology MORPHOLOGY UNREMARKABLE    RBC Morphology MORPHOLOGY UNREMARKABLE    Smear Review Normal platelet morphology    Immature Granulocytes 0 %   Abs Immature Granulocytes 0.02 0.00 - 0.07 K/uL    Comment: Performed at  [facility], [address] [place], [place], [place] [address]  Brain natriuretic peptide      Status: Abnormal   Collection Time: 06/19/23  8:36 AM  Result Value Ref Range   B Natriuretic Peptide 323.7 (H) 0.0 - 100.0 pg/mL    Comment: Performed at [facility], [address], [place], [place] [address]  Troponin I (High Sensitivity)     Status: None   Collection Time: 06/19/23  8:36 AM  Result Value Ref Range   Troponin I (High Sensitivity) 17 <18 ng/L    Comment: (NOTE) Elevated high sensitivity troponin I (hsTnI) values and significant  changes across serial measurements may suggest ACS but many other  chronic and acute conditions are known to elevate hsTnI results.  Refer to the "Links" section for chest pain algorithms and additional  guidance. Performed at [facility], [address], [place], [place] [address]   D-dimer, quantitative     Status: Abnormal   Collection Time: 06/19/23  9:38 AM  Result Value Ref Range   D-Dimer, Quant 1.57 (H) 0.00 - 0.50 ug/mL-FEU    Comment: (NOTE) At the manufacturer cut-off value of 0.5 g/mL FEU, this assay has a negative predictive value of 95-100%.This assay is intended for use in conjunction with a clinical pretest probability (PTP) assessment model to exclude pulmonary embolism (PE) and deep venous thrombosis (DVT) in outpatients suspected of PE or DVT. Results should be correlated with clinical presentation. Performed at [facility], [address], [place], [place] [address]   Troponin I (High Sensitivity)     Status: None   Collection Time: 06/19/23 10:08 AM  Result Value Ref Range   Troponin I (High Sensitivity) 17 <18 ng/L    Comment: (NOTE) Elevated high sensitivity troponin I (hsTnI) values and significant  changes across serial measurements may suggest ACS but many other  chronic and acute conditions are known to elevate hsTnI results.  Refer to the "Links" section for chest pain algorithms and additional  guidance. Performed at [facility], [address], [place],  [place] [address]    CT Angio Chest PE W/Cm &/Or Wo Cm Result Date: 06/19/2023 CLINICAL DATA:  Pulmonary embolism (PE) suspected, low to intermediate prob, positive D-dimer. EXAM: CT ANGIOGRAPHY CHEST WITH CONTRAST TECHNIQUE: Multidetector CT imaging of the chest was performed using the standard protocol during bolus administration of intravenous contrast. Multiplanar CT image reconstructions and MIPs were obtained to evaluate the vascular anatomy. RADIATION DOSE REDUCTION: This exam was performed according to the departmental dose-optimization program which includes automated exposure control, adjustment of the mA and/or kV according to patient size and/or use of iterative reconstruction technique. CONTRAST:  75mL OMNIPAQUE IOHEXOL 350 MG/ML SOLN COMPARISON:  Chest radiograph 06/19/2023. CT chest/abdomen/pelvis 01/01/2021. FINDINGS: Cardiovascular: Satisfactory opacification of the pulmonary arteries to the segmental level. No evidence of pulmonary embolism. Mild cardiomegaly. No pericardial effusion. Atherosclerotic calcifications of the thoracic aorta and coronary arteries. Mediastinum/Nodes: Numerous mildly enlarged mediastinal and hilar lymph nodes, likely reactive. Lungs/Pleura: Mild interlobular septal thickening and mosaic attenuation of the lungs, consistent with mild pulmonary edema. Trace bilateral pleural effusions with adjacent atelectasis in the lung bases. No pneumothorax. Upper Abdomen: No acute abnormality. Musculoskeletal: No chest wall abnormality. No acute or significant osseous findings. Review of the MIP images confirms the above findings. IMPRESSION: 1. No evidence of pulmonary embolism. 2. Mild cardiomegaly with mild pulmonary edema and trace bilateral pleural effusions. 3. Numerous mildly enlarged mediastinal and  hilar lymph nodes, likely reactive. 4. Coronary artery calcifications. Aortic Atherosclerosis (ICD10-I70.0). Electronically Signed   By: Orvan Falconer M.D.   On: 06/19/2023 14:06    DG Chest 2 View Result Date: 06/19/2023 CLINICAL DATA:  Shortness of breath EXAM: CHEST - 2 VIEW COMPARISON:  10/09/2021 FINDINGS: Chronic cardiopericardial enlargement. Fissural thickening especially seen on the lateral view. No Kerley lines or consolidation. Trace pleural fluid on the left. IMPRESSION: Cardiomegaly with mild fissure thickening and trace left pleural fluid, question early CHF. Electronically Signed   By: Tiburcio Pea M.D.   On: 06/19/2023 07:40    Pending Labs Unresulted Labs (From admission, onward)     Start     Ordered   06/19/23 1342  SARS Coronavirus 2 by RT PCR (hospital order, performed in [facility] hospital lab) *cepheid single result test* Anterior Nasal Swab  (Tier 2 - SARS Coronavirus 2 by RT PCR (hospital order, performed in [facility] hospital lab) *cepheid single result test*)  Once,   R        06/19/23 1351            Vitals/Pain Today's Vitals   06/19/23 1030 06/19/23 1057 06/19/23 1245 06/19/23 1323  BP: (!) 169/107  (!) 158/77   Pulse: 76  82   Resp: 20  17   Temp:  98.5 F (36.9 C)    TempSrc:  Oral    SpO2: 92%  95%   PainSc:    0-No pain    Isolation Precautions Airborne and Contact precautions  Medications Medications  furosemide (LASIX) injection 40 mg (has no administration in time range)  potassium chloride SA (KLOR-CON M) CR tablet 20 mEq (has no administration in time range)  albuterol (PROVENTIL) (2.5 MG/3ML) 0.083% nebulizer solution 3 mL (has no administration in time range)  allopurinol (ZYLOPRIM) tablet 100 mg (has no administration in time range)  amLODipine (NORVASC) tablet 10 mg (has no administration in time range)  apixaban (ELIQUIS) tablet 5 mg (has no administration in time range)  atorvastatin (LIPITOR) tablet 40 mg (has no administration in time range)  hydrALAZINE (APRESOLINE) tablet 50 mg (has no administration in time range)  insulin aspart (novoLOG) injection 0-15 Units (has no administration in time  range)  potassium chloride SA (KLOR-CON M) CR tablet 40 mEq (40 mEq Oral Given 06/19/23 0951)  iohexol (OMNIPAQUE) 350 MG/ML injection 75 mL (75 mLs Intravenous Contrast Given 06/19/23 1259)    Mobility walks with device     Focused Assessments    R Recommendations: See Admitting Provider Note  Report given to:   Additional Notes: Patient is A&Ox4, been using urinal in the bed and swallows pills without difficulty.

## 2023-06-19 NOTE — ED Triage Notes (Signed)
PT came in POV for SOB with a feeling of throat closing.  He feels like he is having to clear  his throat a lot to clear his airway. The throat issue has been going on for 8 months and he is followed by his PCP for this.  Feels worse since last night. The SOB is associated with throat issue.  Pt is speaking in full sentences but RA sat is 85% in triage. 98% on 2L.  Pt states his sats have been good at home. PT states he was told he had bronchitis.

## 2023-06-19 NOTE — Hospital Course (Addendum)
Dylan [name] is a 77 year old male with history of hypertrophic cardiomyopathy, heart failure with recovered ejection fraction, persistent A-fib on Eliquis, hypertension, CKD stage IIIb, coronary artery disease, T2DM, and pulmonary hypertension who presented with dyspnea and was admitted for acute hypoxic respiratory failure 2/2 heart failure exacerbation.  On admission he was requiring 2 L of oxygen through nasal cannula to maintain O2 sats above 90 while he is not on any supplemental oxygen at home.  Exam was consistent with volume overload and BNP was elevated at 323 although lower than prior heart failure exacerbations when it was 3500.

## 2023-06-20 ENCOUNTER — Other Ambulatory Visit (HOSPITAL_COMMUNITY): Payer: Self-pay

## 2023-06-20 ENCOUNTER — Observation Stay (HOSPITAL_BASED_OUTPATIENT_CLINIC_OR_DEPARTMENT_OTHER): Payer: Medicare HMO

## 2023-06-20 ENCOUNTER — Encounter (HOSPITAL_COMMUNITY): Payer: Self-pay | Admitting: Internal Medicine

## 2023-06-20 DIAGNOSIS — R0609 Other forms of dyspnea: Secondary | ICD-10-CM | POA: Diagnosis not present

## 2023-06-20 DIAGNOSIS — J9601 Acute respiratory failure with hypoxia: Principal | ICD-10-CM

## 2023-06-20 DIAGNOSIS — I5023 Acute on chronic systolic (congestive) heart failure: Secondary | ICD-10-CM

## 2023-06-20 LAB — CBC
HCT: 38.6 % — ABNORMAL LOW (ref 39.0–52.0)
Hemoglobin: 12.9 g/dL — ABNORMAL LOW (ref 13.0–17.0)
MCH: 32.1 pg (ref 26.0–34.0)
MCHC: 33.4 g/dL (ref 30.0–36.0)
MCV: 96 fL (ref 80.0–100.0)
Platelets: 120 10*3/uL — ABNORMAL LOW (ref 150–400)
RBC: 4.02 MIL/uL — ABNORMAL LOW (ref 4.22–5.81)
RDW: 12.9 % (ref 11.5–15.5)
WBC: 9.1 10*3/uL (ref 4.0–10.5)
nRBC: 0 % (ref 0.0–0.2)

## 2023-06-20 LAB — GLUCOSE, CAPILLARY
Glucose-Capillary: 115 mg/dL — ABNORMAL HIGH (ref 70–99)
Glucose-Capillary: 135 mg/dL — ABNORMAL HIGH (ref 70–99)
Glucose-Capillary: 172 mg/dL — ABNORMAL HIGH (ref 70–99)
Glucose-Capillary: 113 mg/dL — ABNORMAL HIGH (ref 70–99)

## 2023-06-20 LAB — BASIC METABOLIC PANEL
Anion gap: 15 (ref 5–15)
BUN: 23 mg/dL (ref 8–23)
CO2: 28 mmol/L (ref 22–32)
Calcium: 9.4 mg/dL (ref 8.9–10.3)
Chloride: 100 mmol/L (ref 98–111)
Creatinine, Ser: 1.67 mg/dL — ABNORMAL HIGH (ref 0.61–1.24)
GFR, Estimated: 41 mL/min — ABNORMAL LOW (ref >60)
Glucose, Bld: 118 mg/dL — ABNORMAL HIGH (ref 70–99)
Potassium: 3.2 mmol/L — ABNORMAL LOW (ref 3.5–5.1)
Sodium: 143 mmol/L (ref 135–145)

## 2023-06-20 LAB — ECHOCARDIOGRAM COMPLETE
AR max vel: 1.85 cm2
AV Area VTI: 1.95 cm2
AV Area mean vel: 1.98 cm2
AV Mean grad: 4.2 mm[Hg]
AV Peak grad: 8.4 mm[Hg]
Ao pk vel: 1.45 m/s
Area-P 1/2: 3.73 cm2
Height: 65.984 in
S' Lateral: 1.9 cm
Weight: 2447.99 [oz_av]

## 2023-06-20 MED ORDER — FUROSEMIDE 10 MG/ML IJ SOLN
40.0000 mg | Freq: Once | INTRAMUSCULAR | Status: AC
  Administered 2023-06-20: 40 mg via INTRAVENOUS
  Filled 2023-06-20: qty 4

## 2023-06-20 MED ORDER — FUROSEMIDE 10 MG/ML IJ SOLN: 40.0000 mg | Freq: Once | INTRAMUSCULAR | Status: DC

## 2023-06-20 MED ORDER — POTASSIUM CHLORIDE CRYS ER 20 MEQ PO TBCR
40.0000 meq | EXTENDED_RELEASE_TABLET | Freq: Once | ORAL | Status: AC
  Administered 2023-06-20: 40 meq via ORAL
  Filled 2023-06-20: qty 2

## 2023-06-20 MED ORDER — FUROSEMIDE 40 MG PO TABS
40.0000 mg | ORAL_TABLET | Freq: Every day | ORAL | Status: DC
  Administered 2023-06-20: 40 mg via ORAL
  Filled 2023-06-20: qty 1

## 2023-06-20 NOTE — Progress Notes (Signed)
Subjective:   Summary: Patient is a 77 year old man with HFrEF 2/2 HOCM, persistent Afib, CKD3b presented with dyspnea and hypoxia, being treated for presumed HFrEF exacerbation  Hospital Day 1  Patient feeling well today. He has not noticed any difference after receiving IV lasix in the ED. He reports a good response to the Lasix. Oxygen was weaned while in the room, maintained O2 sats in the 90s. Discussed possible discharge today, which patient would be amenable to, pending TTE results and ambulatory pulse ox.   Objective:  Vital signs in last 24 hours: Vitals:   06/20/23 0110 06/20/23 0438 06/20/23 0844 06/20/23 0910  BP: (!) 165/101 (!) 148/53 (!) 168/98   Pulse: 66 79 85   Resp:  18 18   Temp: 97.8 F (36.6 C) 97.8 F (36.6 C)    TempSrc: Oral Oral    SpO2: 95% 98% 98% 97%  Weight:  69.4 kg    Height:       Supplemental O2: Nasal Cannula SpO2: 97 % O2 Flow Rate (L/min): 2 L/min   Physical Exam:  Constitutional: well-appearing, in no acute distress Cardiovascular: RRR, no murmurs, rubs or gallops Pulmonary/Chest: normal work of breathing on room air, lungs with small crackles in left base Abdominal: soft, non-tender, non-distended Neuro: 5/5 strength, no focal deficit noted Skin: warm and dry Extremities: upper/lower extremity pulses 2+, no lower extremity edema present   Intake/Output Summary (Last 24 hours) at 06/20/2023 1330 Last data filed at 06/20/2023 0908 Gross per 24 hour  Intake 840 ml  Output 352 ml  Net 488 ml   Net IO Since Admission: 488 mL [06/20/23 1330]  Pertinent Labs:    Latest Ref Rng & Units 06/20/2023    7:32 AM 06/19/2023    8:36 AM 09/12/2022   11:29 AM  CBC  WBC 4.0 - 10.5 K/uL 9.1  10.9  9.8   Hemoglobin 13.0 - 17.0 g/dL 28.4  13.2  44.0   Hematocrit 39.0 - 52.0 % 38.6  36.3  42.4   Platelets 150 - 400 K/uL 120  117  136        Latest Ref Rng & Units 06/20/2023    7:32 AM 06/19/2023    5:40 PM 06/19/2023     8:36 AM  CMP  Glucose 70 - 99 mg/dL 102  725  366   BUN 8 - 23 mg/dL 23  19  21    Creatinine 0.61 - 1.24 mg/dL 4.40  3.47  4.25   Sodium 135 - 145 mmol/L 143  139  141   Potassium 3.5 - 5.1 mmol/L 3.2  3.3  2.7   Chloride 98 - 111 mmol/L 100  100  103   CO2 22 - 32 mmol/L 28  27  22    Calcium 8.9 - 10.3 mg/dL 9.4  9.5  9.5   Total Protein 6.5 - 8.1 g/dL   7.0   Total Bilirubin <1.2 mg/dL   1.2   Alkaline Phos 38 - 126 U/L   46   AST 15 - 41 U/L   15   ALT 0 - 44 U/L   10     Assessment/Plan:   #Acute hypoxic respiratory failure #HFrEF exacerbation Patient was able to be weaned to room air while rounding this morning, however required 2-3L to maintain O2 sats while ambulating. Covid test negative. Though patient appears euvolemic on exam, CT  showing small pulmonary vascular congestion evidence that patient requires more diuresis. Though patient is near his baseline will likely not discharge today. Patient is improving overall.  - Gave IV Lasix 40mg  this morning - TTE today  #Hypokalemia Potassium 3.2 this morning, repleted with Kcl. Continue to monitor with daily labs.   Chronic stable conditions Sore throat:  Unknown etiology. Recommend outpatient evaluation CKD3b: Stable, trend BMP CAD: Continue home statin T2DM: SSI, CBG monitoring Persistent Afib: continue home Eliquis HTN: Continue home hydralazine 50mg  TID, amlodipine 10mg  Gout: Continue home allopurinol  Diet: Carb modified VTE: Eliquis Code: Full  Dispo: Anticipated discharge to Home pending ambulation with maintenance of appropriate O2 sats   Monna Fam, MD PGY-1 Internal Medicine Resident Pager Number [phone] Please contact the on call pager after 5 pm and on weekends at [phone].

## 2023-06-20 NOTE — Care Management (Addendum)
Transition of Care [facility]) - Inpatient Brief Assessment   Patient Details  Name: DVON KUDELKA MRN: [id number] Date of Birth: [birth year]  Transition of Care [facility]) CM/SW Contact:    Lockie Pares, RN Phone Number: 06/20/2023, 2:20 PM   Clinical Narrative: 77 year old patient presented with [facility]. Diuresed. Oxygen parameters done, patient needs oxygen. Spoke with patient on the phone regarding oxygen requirement and obv status. Explained that a tank is delivered to room and then oxygen is set up at home. Inquiring regarding [employer], they would need to call them for coverage verification.  [employer] will bring tank to room for DC,  DC is possible later today or tomorrow. Patient states he has DME walker and cane at home that he does not use currently. He already has a appointment with his primary MD next week   Transition of Care Asessment: Insurance and Status: Insurance coverage has been reviewed Patient has primary care physician: Yes Home environment has been reviewed: Y Prior level of function:: Independent Prior/Current Home Services: No current home services Social Drivers of Health Review: SDOH reviewed no interventions necessary Readmission risk has been reviewed: Yes Transition of care needs: transition of care needs identified, TOC will continue to follow

## 2023-06-20 NOTE — Progress Notes (Signed)
SATURATION QUALIFICATIONS: (This note is used to comply with regulatory documentation for home oxygen)  Patient Saturations on Room Air at Rest = 94%  Patient Saturations on Room Air while Ambulating = 85%  Patient Saturations on 2 Liters of oxygen while Ambulating = 92%  Please briefly explain why patient needs home oxygen: Patient O2 SATS dropped to 85% on RA while ambulating.

## 2023-06-20 NOTE — Progress Notes (Signed)
  Echocardiogram 2D Echocardiogram has been performed.  Dylan [name] 06/20/2023, 10:51 AM

## 2023-06-20 NOTE — Progress Notes (Addendum)
   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   Presented to the ED on 12/12 with shortness of breath and sore throat. Oxygen at home was in the 80s. CXR with cardiomegaly, mild fissure thickening, and trace left pleural effusion. CTA negative for PE. BNP 323 and troponin flat. Received one dose of IV lasix in the ED. Last ECHO 08/05/21 with EF 30-35%, RV severely reduced. R/LHC on 08/09/21 with single vessel CAD, RA 10, PA 62, wedge 8, CO/CI 4.6/2.6. ECHO pending this admission.  Denies shortness of breath or LE edema. States that he is feeling better. Has been up walking without difficulty.   Current HF Medications: Other: hydralazine 50 mg TID  Prior to admission HF Medications: Diuretic: furosemide 40 mg daily MRA: eplerenone 50 mg daily Other: hydralazine 50 mg TID  Pertinent Lab Values: Serum creatinine 1.67, BUN 23, Potassium 3.2, Sodium 143, BNP 323.7  Vital Signs: Weight: 152 lbs (admission weight: 156 lbs) Blood pressure: 140/50-160/90s  Heart rate: 70-90s  I/O: net +0.4L yesterday; net +0.5L since admission  Medication Assistance / Insurance Benefits Check: Does the patient have prescription insurance?  Yes Type of insurance plan: [employer] Medicare  Outpatient Pharmacy:  Prior to admission outpatient pharmacy: [facility] Is the patient willing to use [facility] TOC pharmacy at discharge? Yes Is the patient willing to transition their outpatient pharmacy to utilize a [facility] outpatient pharmacy?   No    Assessment: 1. Acute on chronic systolic CHF (LVEF 30-35%, repeat pending), due to HOCM. NYHA class II symptoms. - Received 1 dose of IV lasix in the ED. Volume looks good. Consider resuming furosemide 40 mg daily at discharge. Strict I/Os and daily weights. Keep K>4 and Mg>2. KCl 40 mEq x 1  given for replacement.  - No ACE/ARB/ARNI with CKD IIIb - History of gynecomastia with spironolactone. Is on eplerenone at home. Holding with AKI on CKD. Recommend restarting at discharge. - Consider starting SGLT2i either at discharge vs as outpatient pending renal function.   Plan: 1) Medication changes recommended at this time: - Restart PO lasix and eplerenone at discharge  2) Patient assistance: - In the donut hole - copays for Jardiance/Farxiga >$100 until insurance resets on 1/1  3)  Education  - Patient has been educated on current HF medications and potential additions to HF medication regimen - Patient verbalizes understanding that over the next few months, these medication doses may change and more medications may be added to optimize HF regimen - Patient has been educated on basic disease state pathophysiology and goals of therapy   Sharen Hones, PharmD, BCPS Heart Failure Stewardship Pharmacist Phone [phone]

## 2023-06-20 NOTE — Care Management Obs Status (Signed)
MEDICARE OBSERVATION STATUS NOTIFICATION   Patient Details  Name: Dylan [name] MRN: [id number] Date of Birth: 21-Jan-1946   Medicare Observation Status Notification Given:  Yes   Verbal permission to sign Lockie Pares, RN 06/20/2023, 2:14 PM

## 2023-06-20 NOTE — Plan of Care (Signed)

## 2023-06-20 NOTE — Progress Notes (Signed)
Heart Failure Nurse Navigator Progress Note  PCP: Miguel Aschoff, MD PCP-Cardiologist: O'[place]al Admission Diagnosis: Acute hypoxemic respiratory failure.  Admitted from: Home  Presentation:   Dylan [name] presented with shortness of breath and feeling of his throat closing. (Been ongoing for around 8 months) saw ENT , patient feels it was no help to him. BP 158/77, HR 82, BNP 323.7, D-dimer 1.57, CXR with cardiomegaly with mild fissure thickening and trace left pleural fluid. CT negative for PE, New Echo pending for this admission.,   Patient educated on the sign and symptoms of heart failure, daily weights, when ot call his doctor or go to the ED, diet/ fluid restrictions, taking all medications as prescribed and attending all medical appointments. Patient verbalized his understanding. A HF TOC appointment was scheduled for 07/04/2023 @ 11 am.   ECHO/ LVEF: 30-35%  Clinical Course:  Past Medical History:  Diagnosis Date   Anemia 10/18/2014   Baseline about 12 and stable from 2010 to 2016. Colon 2009 in [place] (records cannot be obtained).  EGD Dr Elnoria Howard 2011 nl    Aortic atherosclerosis ([name]) 03/28/2020   Incidental finding on imaging CT    Atherosclerosis 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 ([name]) 12/28/2014   ABI Feb 2017 R 0.49; L 0.72 with diffuse dz ABI Aug 2017 R 0.49; L 0.69 ABI Jan 2018 R 0.61; L 0.73  ABI Feb 2019 R 0.55; L 0.71 Sees Dr Imogene Burn - recs ABI q 6 months   Chronic diastolic 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 ([name]) 10/18/2014   Noted ECHO 10/2014. Grade 2. EF 50-55%; echo repeated 2019, severe hypertrophy with elevated filling pressures   Chronic kidney disease, stage 3b ([name]) 03/09/2020   Dr. 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 Grade 2. EF 50-55%; echo repeated 2019, severe hypertrophy with elevated filling pressures   Repeat ECHO 08/04/21 showed EF 30-35%, severely elevated PASP, severely reduced RVSF, severely dilated LA and RA, small pericardial effusion     Coronary artery disease without angina pectoris    Degloving injury of finger 03/28/2020   10/21/19: copied from op note "1.  Open reduction percutaneous pinning left small finger proximal phalanx fracture 2.  Complex repair of laceration to left small finger 3 cm in length 3.  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 No significant callus formation is noted. Pins had been removed   Diverticulosis 10/08/2014   Seen on CT. Reportedly on Colon in [place] in 2009. Freq bouts of diverticulitis.   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 Stable from 2010 to 2016.  Negative SPEP and UPEP 2014 after ARF 2/2 continued ACE (lisinopril 20) use while vol contracted. 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  Failed CPAP on study. BiPA inspiratory pressure of 21 and expiratory pressure of 17 CWP. Consider ENT evaluation for potentially correctable upper airway obstruction contributing to the need for unusually high pressures - ENT July 2015 did not feel any intervention surgically was indicated. He wore a large F&P Simplus fullfac   Personal history of colonic polyps 03/28/2014   Dr.  Elnoria Howard, 2 polyps removed 2015.  No polyps on f/u in 2020.  No further surveillance suggested per Dr. Elnoria Howard.   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  He is unable to breathe through his nose comfortably.  This is part of the reason why he does not wear his oxygen regularly.  Nasal passages are nearly completely obstructed erythematous smooth glistening surface.  He has been told by his eye doctor that part of the reason his e   Resistant hypertension 10/09/2014   Poor control with 6 drug therapy. 2016 : Aldo 37 but ARR 23.5    Severe pulmonary arterial systolic hypertension ([name]) 10/18/2014   Noted as severe ECHO 2016 and 2019. Likely 2/2 severe untreated OSA. Pt is not adherent to CPAP.   Stage 3b chronic kidney disease (CKD) ([name]) 03/09/2020   Dr. Hyman Hopes  Nephrologist, f/u Q 40M     Syncope 12/31/2020   Thrombocytopenia ([name]) 10/09/2016   Nl liver and spleen on [place] 2019   Ulcer aphthous oral 04/27/2020   Evaluated emergency room last week, saw with Magic mouthwash   Unintentional weight loss due to Trulicity, resolved 11/28/2020     Social History   Socioeconomic History   Marital status: Divorced    Spouse name: Not on file   Number of children: 2   Years of education: Not on file   Highest education level: Not on file  Occupational History   Occupation: retired  Tobacco Use   Smoking status: Former    Current packs/day: 0.00    Average packs/day: 0.5 packs/day for 20.0 years (10.0 ttl pk-yrs)    Types: Cigarettes    Start date: 10/02/1952    Quit date: 10/02/1972    Years since quitting: 50.7   Smokeless tobacco: Never  Vaping Use   Vaping status: Never Used  Substance and Sexual Activity   Alcohol use: No    Alcohol/week: 0.0 standard drinks of alcohol   Drug use: No   Sexual activity: Never  Other Topics Concern   Not on file  Social History  Narrative   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 Had yearly occupational testing inc PFT's. Now retired. Divorced. Has male sig other. Likes to go fishing.      He starting drinking as a teen and would drink to passing out. Couldn't keep job, have family. He quit drinking and smoking cold [place] with help of his faith. No ETOH since about age 40ish      Total of 7 brothers and 6 sisters.   Social Drivers of Corporate investment banker Strain: Low Risk  (09/12/2022)   Overall Financial Resource Strain (CARDIA)    Difficulty of Paying Living Expenses: Not hard at all  Food Insecurity: No Food Insecurity (06/19/2023)   Hunger Vital Sign    Worried About Running Out of Food in the Last Year: Never true    Ran Out of Food in the Last Year: Never true  Transportation Needs: No Transportation Needs (06/19/2023)  PRAPARE - Administrator, Civil Service (Medical): No    Lack of Transportation (Non-Medical): No  Physical Activity: Insufficiently Active (09/12/2022)   Exercise Vital Sign    Days of Exercise per Week: 3 days    Minutes of Exercise per Session: 30 min  Stress: No Stress Concern Present (09/12/2022)   [employer] of Occupational Health - Occupational Stress Questionnaire    Feeling of Stress : Not at all  Social Connections: Unknown (09/12/2022)   Social Connection and Isolation Panel [NHANES]    Frequency of Communication with Friends and Family: Patient declined    Frequency of Social Gatherings with Friends and Family: Patient declined    Attends Religious Services: Patient declined    Database administrator or Organizations: No    Attends Engineer, structural: Patient declined    Marital Status: Divorced   Water engineer and Provision:  Detailed education and instructions provided on heart failure disease management including the following:  Signs and symptoms of Heart Failure When to call the physician Importance of daily weights Low sodium  diet Fluid restriction Medication management Anticipated future follow-up appointments  Patient education given on each of the above topics.  Patient acknowledges understanding via teach back method and acceptance of all instructions.  Education Materials:  "Living Better With Heart Failure" Booklet, HF zone tool, & Daily Weight Tracker Tool.  Patient has scale at home: Yes Patient has pill box at home: Yes    High Risk Criteria for Readmission and/or Poor Patient Outcomes: Heart failure hospital admissions (last 6 months): 0  No Show rate: 8 % Difficult social situation: No Demonstrates medication adherence: Yes Primary Language: English Literacy level: Reading, writing, and comprehension  Barriers of Care:   Diet/ fluid restrictions  Daily weights Some reading/ writing  Considerations/Referrals:   Referral made to Heart Failure Pharmacist Stewardship: yes Referral made to Heart Failure CSW/NCM TOC: No Referral made to Heart & Vascular TOC clinic: Yes, 07/04/2023 @ 11 am  Items for Follow-up on DC/TOC: Continued Verbal education on HF  Diet/ fluid restrictions/ daily weights   Rhae Hammock, BSN, RN Heart Failure Teacher, adult education Only

## 2023-06-21 LAB — BASIC METABOLIC PANEL
Anion gap: 10 (ref 5–15)
Anion gap: 11 (ref 5–15)
BUN: 29 mg/dL — ABNORMAL HIGH (ref 8–23)
BUN: 34 mg/dL — ABNORMAL HIGH (ref 8–23)
CO2: 26 mmol/L (ref 22–32)
CO2: 25 mmol/L (ref 22–32)
Calcium: 9.3 mg/dL (ref 8.9–10.3)
Calcium: 9.6 mg/dL (ref 8.9–10.3)
Chloride: 105 mmol/L (ref 98–111)
Chloride: 103 mmol/L (ref 98–111)
Creatinine, Ser: 1.99 mg/dL — ABNORMAL HIGH (ref 0.61–1.24)
Creatinine, Ser: 2.25 mg/dL — ABNORMAL HIGH (ref 0.61–1.24)
GFR, Estimated: 34 mL/min — ABNORMAL LOW (ref >60)
GFR, Estimated: 29 mL/min — ABNORMAL LOW (ref >60)
Glucose, Bld: 115 mg/dL — ABNORMAL HIGH (ref 70–99)
Glucose, Bld: 107 mg/dL — ABNORMAL HIGH (ref 70–99)
Potassium: 2.9 mmol/L — ABNORMAL LOW (ref 3.5–5.1)
Potassium: 3.5 mmol/L (ref 3.5–5.1)
Sodium: 141 mmol/L (ref 135–145)
Sodium: 139 mmol/L (ref 135–145)

## 2023-06-21 LAB — GLUCOSE, CAPILLARY
Glucose-Capillary: 114 mg/dL — ABNORMAL HIGH (ref 70–99)
Glucose-Capillary: 156 mg/dL — ABNORMAL HIGH (ref 70–99)
Glucose-Capillary: 150 mg/dL — ABNORMAL HIGH (ref 70–99)
Glucose-Capillary: 108 mg/dL — ABNORMAL HIGH (ref 70–99)

## 2023-06-21 MED ORDER — FUROSEMIDE 40 MG PO TABS: 40.0000 mg | ORAL_TABLET | Freq: Every day | ORAL | Status: DC

## 2023-06-21 MED ORDER — POTASSIUM CHLORIDE CRYS ER 20 MEQ PO TBCR
40.0000 meq | EXTENDED_RELEASE_TABLET | Freq: Once | ORAL | Status: AC
  Administered 2023-06-21: 40 meq via ORAL
  Filled 2023-06-21: qty 2

## 2023-06-21 NOTE — Progress Notes (Signed)
HD#0 Subjective:   Summary: Dylan [name] is a 77 y.o. male with pertinent PMH of hypertrophic cardiomyopathy with HFimpEF, pulmonary hypertension, A-fib on Eliquis, HTN, CKD stage IIIb, CAD, and T2DM who presented with dyspnea and is admitted for acute hypoxic respiratory failure 2/2 heart failure exacerbation.   Overnight Events: None  Patient is feeling well today without any new or worsening complaints.  He was able to walk around the unit without supplemental oxygen and his sats only dropped to 93.  He does not feel like he is drinking as much water as he is at home likely due to fluid restriction here.  Objective:  Vital signs in last 24 hours: Vitals:   06/21/23 0500 06/21/23 0700 06/21/23 0743 06/21/23 0840  BP:  (!) 150/68 (!) 148/95 (!) 148/95  Pulse:  76 89   Resp:  18 18   Temp:  98.4 F (36.9 C) 98.5 F (36.9 C)   TempSrc:  Oral Oral   SpO2:  94% 96%   Weight: 68 kg     Height:       Supplemental O2: Room Air    Physical Exam:  Constitutional: Well-appearing elderly male, in no acute distress Cardiovascular: Irregularly irregular rhythm with normal rate Pulmonary/Chest: normal work of breathing on room air, lungs clear to auscultation bilaterally MSK: No lower extremity edema Neurological: alert & oriented x 3, No focal deficits noted Skin: warm and dry  Filed Weights   06/19/23 1520 06/20/23 0438 06/21/23 0500  Weight: 71.2 kg 69.4 kg 68 kg      Intake/Output Summary (Last 24 hours) at 06/21/2023 1432 Last data filed at 06/20/2023 2123 Gross per 24 hour  Intake --  Output 500 ml  Net -500 ml   Net IO Since Admission: -12 mL [06/21/23 1432]  Pertinent Labs:    Latest Ref Rng & Units 06/20/2023    7:32 AM 06/19/2023    8:36 AM 09/12/2022   11:29 AM  CBC  WBC 4.0 - 10.5 K/uL 9.1  10.9  9.8   Hemoglobin 13.0 - 17.0 g/dL 91.4  78.2  95.6   Hematocrit 39.0 - 52.0 % 38.6  36.3  42.4   Platelets 150 - 400 K/uL 120  117  136        Latest Ref  Rng & Units 06/21/2023    1:25 PM 06/21/2023    6:24 AM 06/20/2023    7:32 AM  CMP  Glucose 70 - 99 mg/dL 213  086  578   BUN 8 - 23 mg/dL 34  29  23   Creatinine 0.61 - 1.24 mg/dL 4.69  6.29  5.28   Sodium 135 - 145 mmol/L 139  141  143   Potassium 3.5 - 5.1 mmol/L 3.5  2.9  3.2   Chloride 98 - 111 mmol/L 103  105  100   CO2 22 - 32 mmol/L 25  26  28    Calcium 8.9 - 10.3 mg/dL 9.6  9.3  9.4     Assessment/Plan:   Principal Problem:   Acute hypoxic respiratory failure ([name]) Active Problems:   Acute on chronic systolic congestive heart failure ([name])   Patient Summary: Dylan [name] is a 77 y.o. male with pertinent PMH of hypertrophic cardiomyopathy with HFimpEF, pulmonary hypertension, A-fib on Eliquis, HTN, CKD stage IIIb, CAD, and T2DM who presented with dyspnea and is admitted for acute hypoxic respiratory failure 2/2 heart failure exacerbation, on hospital day 2.   Acute hypoxic respiratory  failure 2/2 HFrEF exacerbation Hypertrophic cardiomyopathy Pulmonary hypertension Patient has now on room air and able to ambulate without significant desaturation after receiving Lasix 40 mg IV yesterday and the day prior.  We do not have reliable ins and outs but he appears drier and has developed an AKI as below.  TTE done this admission shows LVEF 70 to 75% with hyperdynamic LV function, severe LVH, moderately reduced RV function, severely enlarged RV with RVSP 50.6, and severe biatrial dilation.  Discussed with on-call cardiology due to change in EF from prior echo done in 2023 which showed EF of 30 to 35% but was in the setting of significant acute heart failure exacerbation.  Overall his right sided issues are stable and no need to change management at this time on an outpatient basis.  However due to his AKI likely due to overdiuresis we will hold his diuresis today. - Hold Lasix today  AKI on CKD stage IIIb Hypokalemia Creatinine bump from 1.67 yesterday to 1.99 this morning and 2.25  on recheck this afternoon.  Potassium was also low at 2.9 this morning but responded well to oral replacement.  AKI likely in the setting of overdiuresis and decreased fluid intake in the hospital. - Encourage oral fluids and recheck BMP in the morning - If improving but not resolved AKI we will plan to follow-up with the lab visit this week in our clinic - Continue to hold MRA  Hypertension Blood pressure is mildly elevated in the setting of not receiving mineralocorticoid receptor antagonist due to eplerenone not being on her formulary and development of AKI today.  If blood pressure does get higher than 170 systolic or 120 diastolic we can add on an as needed medication. - Continue hydralazine 50 mg 3 times daily and amlodipine 10 mg daily  Stable chronic issues are resolved issues A-fib Coronary artery disease T2DM Gout   Diet: Carb-Modified IVF: None, VTE: DOAC Code: Full  Dispo: Anticipated discharge to Home in 1 days pending improvement in AKI and continued stability off supplemental oxygen.   Rocky Morel, DO Internal Medicine Resident PGY-2 Pager: [phone]  Please contact the on call pager after 5 pm and on weekends at [phone].

## 2023-06-21 NOTE — Plan of Care (Signed)

## 2023-06-21 NOTE — Evaluation (Signed)
Occupational Therapy Evaluation Patient Details Name: Dylan [name] MRN: [id number] DOB: [birth year] Today's Date: 06/21/2023   History of Present Illness Patient is a 77 year old man presented with dyspnea and hypoxia, admitted 12/12 for presumed HFrEF exacerbation. 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   Clinical Impression   Pt reports living alone, ind with ADLs/mobility at baseline, does have a shower seat which he uses PRN. Pt performing ADLs without assist, independent with bed mobility and transfers/mobility in room. Pt educated on checking oxygen level and energy conservation strategies for home, pt verbalized understanding. Pt presenting with impairments listed below, however has no acute OT needs at this time, will s/o.  Please reconsult if there is a change in pt status.        If plan is discharge home, recommend the following: Assistance with cooking/housework    Functional Status Assessment  Patient has had a recent decline in their functional status and demonstrates the ability to make significant improvements in function in a reasonable and predictable amount of time.  Equipment Recommendations  None recommended by OT    Recommendations for Other Services       Precautions / Restrictions Precautions Precautions: None Precaution Comments: monitor O2 Restrictions Weight Bearing Restrictions Per Provider Order: No      Mobility Bed Mobility Overal bed mobility: Independent                  Transfers Overall transfer level: Independent                        Balance Overall balance assessment: No apparent balance deficits (not formally assessed)                                         ADL either performed or assessed with clinical judgement   ADL Overall ADL's : Independent                                       General ADL Comments: pt performing simulated tub transfer and toilet trasnfer, reports  performing LB dressing independently earlier today     Vision   Vision Assessment?: No apparent visual deficits Additional Comments: reports getting shots in R eye for retinal bleed     Perception Perception: Not tested       Praxis Praxis: Not tested       Pertinent Vitals/Pain Pain Assessment Pain Assessment: No/denies pain     Extremity/Trunk Assessment Upper Extremity Assessment Upper Extremity Assessment: Generalized weakness   Lower Extremity Assessment Lower Extremity Assessment: Defer to PT evaluation   Cervical / Trunk Assessment Cervical / Trunk Assessment: Normal   Communication Communication Communication: Hearing impairment   Cognition Arousal: Alert Behavior During Therapy: WFL for tasks assessed/performed Overall Cognitive Status: Within Functional Limits for tasks assessed                                       General Comments  VSS    Exercises     Shoulder Instructions      Home Living Family/patient expects to be discharged to:: Private residence Living Arrangements: Alone Available Help at Discharge: Family;Available PRN/intermittently Type of Home: Apartment  Home Access: Stairs to enter Entrance Stairs-Number of Steps: 4+4 Entrance Stairs-Rails: Right Home Layout: One level     Bathroom Shower/Tub: Chief Strategy Officer: Standard Bathroom Accessibility: Yes   Home Equipment: Automotive engineer - single point          Prior Functioning/Environment Prior Level of Function : Independent/Modified Independent;Driving             Mobility Comments: no AD use ADLs Comments: sit to shower, ind with ADLs        OT Problem List:        OT Treatment/Interventions:      OT Goals(Current goals can be found in the care plan section) Acute Rehab OT Goals Patient Stated Goal: none stated OT Goal Formulation: With patient Time For Goal Achievement: 07/05/23 Potential to Achieve  Goals: Good  OT Frequency:      Co-evaluation              AM-PAC OT "6 Clicks" Daily Activity     Outcome Measure Help from another person eating meals?: None Help from another person taking care of personal grooming?: None Help from another person toileting, which includes using toliet, bedpan, or urinal?: None Help from another person bathing (including washing, rinsing, drying)?: None Help from another person to put on and taking off regular upper body clothing?: None Help from another person to put on and taking off regular lower body clothing?: None 6 Click Score: 24   End of Session Nurse Communication: Mobility status  Activity Tolerance: Patient tolerated treatment well Patient left: in bed;with call bell/phone within reach  OT Visit Diagnosis: Muscle weakness (generalized) (M62.81)                Time: 0454-0981 OT Time Calculation (min): 20 min Charges:  OT General Charges $OT Visit: 1 Visit OT Evaluation $OT Eval Low Complexity: 1 Low  Carver Fila, OTD, OTR/L SecureChat Preferred Acute Rehab [phone]    Jaques Mineer K Koonce 06/21/2023, 9:43 AM

## 2023-06-21 NOTE — Discharge Instructions (Signed)
Dylan [name],  You were recently admitted to [facility] for a heart failure exacerbation and responded well to IV Lasix.  I am glad that we do not have to send you home with oxygen.  Please go to the internal medicine center on Tuesday to get your blood drawn and blood pressure taken.  I will call you with results of the lab when they come back.  Please follow-up with your primary care doctor in the next 1 to 2 weeks and your cardiologist as scheduled.  Continue taking your home medications with the addition of potassium 20 mEq daily and do not take eplerenone (Inspar) until after we have gone over your lab results.  You should seek further medical care if you have any return of your shortness of breath or other worrisome symptoms.  We recommend that you see your primary care doctor in about a week to make sure that you continue to improve. We are so glad that you are feeling better.  Sincerely, Rocky Morel, DO

## 2023-06-21 NOTE — Evaluation (Signed)
Physical Therapy Evaluation and Discharge Patient Details Name: Dylan [name] MRN: [id number] DOB: 14-Nov-1945 Today's Date: 06/21/2023  History of Present Illness  Patient is a 77 year old man presented with dyspnea and hypoxia, admitted 12/12 for presumed HFrEF exacerbation. 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    Clinical Impression  Patient evaluated by Physical Therapy with no further acute PT needs identified. All education has been completed and the patient has no further questions. Independent at baseline. SpO2 96% on RA at rest, 93% while ambulating. No assistive device needed. Denies SOB, no dyspnea noted. No acute needs identified moving forward. See below for any follow-up Physical Therapy or equipment needs. PT is signing off. Thank you for this referral.         If plan is discharge home, recommend the following:     Can travel by private vehicle     yes    Equipment Recommendations None recommended by PT  Recommendations for Other Services       Functional Status Assessment Patient has not had a recent decline in their functional status     Precautions / Restrictions Precautions Precautions: None Precaution Comments: monitor O2 Restrictions Weight Bearing Restrictions Per Provider Order: No      Mobility  Bed Mobility Overal bed mobility: Independent                  Transfers Overall transfer level: Independent                 General transfer comment: stable upon rising    Ambulation/Gait Ambulation/Gait assistance: Modified independent (Device/Increase time) Gait Distance (Feet): 350 Feet Assistive device: None Gait Pattern/deviations: WFL(Within Functional Limits) Gait velocity: wfl Gait velocity interpretation: 1.31 - 2.62 ft/sec, indicative of limited community ambulator   General Gait Details: Grossly stable without overt LOB, fair pace, no stumbling or drifting during bout. SpO2 lowest read at 93%, denies SOB, no  dyspnea noted. HR to 122.  Stairs            Wheelchair Mobility     Tilt Bed    Modified Rankin (Stroke Patients Only)       Balance Overall balance assessment: Modified Independent                                           Pertinent Vitals/Pain Pain Assessment Pain Assessment: No/denies pain    Home Living Family/patient expects to be discharged to:: Private residence Living Arrangements: Alone Available Help at Discharge: Family;Available PRN/intermittently Type of Home: Apartment Home Access: Stairs to enter Entrance Stairs-Rails: Right Entrance Stairs-Number of Steps: 4+4   Home Layout: One level Home Equipment: Automotive engineer - single point      Prior Function Prior Level of Function : Independent/Modified Independent;Driving             Mobility Comments: no AD use ADLs Comments: sit to shower, ind with ADLs     Extremity/Trunk Assessment   Upper Extremity Assessment Upper Extremity Assessment: Generalized weakness    Lower Extremity Assessment Lower Extremity Assessment: Defer to PT evaluation    Cervical / Trunk Assessment Cervical / Trunk Assessment: Normal  Communication   Communication Communication: Hearing impairment  Cognition Arousal: Alert Behavior During Therapy: WFL for tasks assessed/performed Overall Cognitive Status: Within Functional Limits for tasks assessed  General Comments General comments (skin integrity, edema, etc.): VSS    Exercises     Assessment/Plan    PT Assessment Patient does not need any further PT services  PT Problem List         PT Treatment Interventions      PT Goals (Current goals can be found in the Care Plan section)  Acute Rehab PT Goals Patient Stated Goal: Go home today PT Goal Formulation: All assessment and education complete, DC therapy    Frequency       Co-evaluation                AM-PAC PT "6 Clicks" Mobility  Outcome Measure                  End of Session   Activity Tolerance: Patient tolerated treatment well Patient left: in bed;with call bell/phone within reach Nurse Communication: Mobility status PT Visit Diagnosis: Other abnormalities of gait and mobility (R26.89)    Time: 4401-0272 PT Time Calculation (min) (ACUTE ONLY): 20 min   Charges:   PT Evaluation $PT Eval Low Complexity: 1 Low   PT General Charges $$ ACUTE PT VISIT: 1 Visit         Kathlyn Sacramento, PT, DPT [facility]  Rehabilitation Services Physical Therapist Office: [phone] Website: [email or web address]   Berton Mount 06/21/2023, 10:12 AM

## 2023-06-22 ENCOUNTER — Other Ambulatory Visit: Payer: Self-pay | Admitting: Student

## 2023-06-22 DIAGNOSIS — N179 Acute kidney failure, unspecified: Secondary | ICD-10-CM

## 2023-06-22 DIAGNOSIS — I5023 Acute on chronic systolic (congestive) heart failure: Secondary | ICD-10-CM | POA: Diagnosis not present

## 2023-06-22 DIAGNOSIS — E876 Hypokalemia: Secondary | ICD-10-CM

## 2023-06-22 DIAGNOSIS — N1832 Chronic kidney disease, stage 3b: Secondary | ICD-10-CM

## 2023-06-22 DIAGNOSIS — J9601 Acute respiratory failure with hypoxia: Secondary | ICD-10-CM | POA: Diagnosis not present

## 2023-06-22 LAB — BASIC METABOLIC PANEL
Anion gap: 11 (ref 5–15)
BUN: 33 mg/dL — ABNORMAL HIGH (ref 8–23)
CO2: 23 mmol/L (ref 22–32)
Calcium: 9.2 mg/dL (ref 8.9–10.3)
Chloride: 104 mmol/L (ref 98–111)
Creatinine, Ser: 1.97 mg/dL — ABNORMAL HIGH (ref 0.61–1.24)
GFR, Estimated: 34 mL/min — ABNORMAL LOW (ref >60)
Glucose, Bld: 106 mg/dL — ABNORMAL HIGH (ref 70–99)
Potassium: 3.4 mmol/L — ABNORMAL LOW (ref 3.5–5.1)
Sodium: 138 mmol/L (ref 135–145)

## 2023-06-22 LAB — GLUCOSE, CAPILLARY
Glucose-Capillary: 138 mg/dL — ABNORMAL HIGH (ref 70–99)
Glucose-Capillary: 177 mg/dL — ABNORMAL HIGH (ref 70–99)

## 2023-06-22 MED ORDER — POTASSIUM CHLORIDE CRYS ER 20 MEQ PO TBCR: 20.0000 meq | EXTENDED_RELEASE_TABLET | Freq: Every day | ORAL | 0 refills | Status: DC

## 2023-06-22 NOTE — Plan of Care (Signed)
  Problem: Education: Goal: Ability to describe self-care measures that may prevent or decrease complications (Diabetes Survival Skills Education) will improve Outcome: Progressing   Problem: Fluid Volume: Goal: Ability to maintain a balanced intake and output will improve Outcome: Progressing   Problem: Skin Integrity: Goal: Risk for impaired skin integrity will decrease Outcome: Progressing

## 2023-06-22 NOTE — Discharge Summary (Addendum)
Name: Dylan [name] MRN: [id number] DOB: [birth year] 77 y.o. PCP: Dylan Aschoff, MD  Date of Admission: 06/19/2023  7:02 AM Date of Discharge: 06/22/2023 Attending Physician: Dr. Sol [name]  Discharge Diagnosis: Principal Problem:   Acute hypoxic respiratory failure ([name]) Active Problems:   Acute on chronic systolic congestive heart failure ([name]) AKI on CKD stage IIIb Hypokalemia Hypertrophic cardiomyopathy Pulmonary hypertension Hypertension Atrial fibrillation Coronary artery disease Type 2 diabetes Gout  Discharge Medications: Allergies as of 06/22/2023       Reactions   Ace Inhibitors Other (See Comments)   "ARF - see CRF overview"   Spironolactone Other (See Comments)   Gynecomastia per pt report        Medication List     PAUSE taking these medications    eplerenone 50 MG tablet Wait to take this until your doctor or other care provider tells you to start again. Commonly known as: INSPRA Take 1 tablet (50 mg total) by mouth daily.       TAKE these medications    albuterol 108 (90 Base) MCG/ACT inhaler Commonly known as: VENTOLIN HFA Inhale into the lungs every 6 (six) hours as needed for wheezing or shortness of breath.   allopurinol 100 MG tablet Commonly known as: ZYLOPRIM Take 1 tablet (100 mg total) by mouth daily.   amLODipine 10 MG tablet Commonly known as: NORVASC Take 1 tablet (10 mg total) by mouth daily.   apixaban 5 MG Tabs tablet Commonly known as: ELIQUIS Take 1 tablet (5 mg total) by mouth 2 (two) times daily.   atorvastatin 40 MG tablet Commonly known as: LIPITOR Take 1 tablet (40 mg total) by mouth daily.   FREESTYLE LITE test strip Generic drug: glucose blood 1 each by Other route in the morning.   furosemide 40 MG tablet Commonly known as: Lasix Take 1 tablet (40 mg total) by mouth daily.   hydrALAZINE 50 MG tablet Commonly known as: APRESOLINE Take 1 tablet (50 mg total) by mouth 3 (three) times daily.    ipratropium 0.03 % nasal spray Commonly known as: ATROVENT Place 2 sprays into both nostrils 3 (three) times daily as needed for rhinitis.   latanoprost 0.005 % ophthalmic solution Commonly known as: XALATAN Place 1 drop into both eyes at bedtime.   metFORMIN 500 MG tablet Commonly known as: GLUCOPHAGE Take 1 tablet (500 mg total) by mouth 2 (two) times daily with a meal.   potassium chloride SA 20 MEQ tablet Commonly known as: KLOR-CON M Take 1 tablet (20 mEq total) by mouth daily.        Disposition and follow-up:   [name] was discharged from [facility] in Good condition.  At the hospital follow up visit please address:  1.  Follow-up:  a.  Repeat BMP and blood pressure within [facility] lab/nurse visit.  Also evaluate for hypoxia.  If repeat BMP shows improved renal function and no hyperkalemia resume eplerenone and stop potassium supplementation.    Follow-up Appointments:  Follow-up Information     [facility]. Go in 12 day(s).   Specialty: Cardiology Why: Hospital follow up 07/04/23 @ 11 am PLEASE bring a current medication list to appointments FREE valet parking, Entrance C, off [employer] information: [address] [place] [address] [phone]        [facility] Follow up.   Why: Oxygen provider        Dylan Aschoff,  MD Follow up.   Specialty: Internal Medicine Why: Patient states he has a  appointment  next week already scheduled Contact information: [address] [place] [address] [phone]                 Hospital Course by problem list: Dylan [name] is a 77 year old male with history of hypertrophic cardiomyopathy, heart failure with recovered ejection fraction, persistent A-fib on Eliquis, hypertension, CKD stage IIIb, coronary artery disease, T2DM, and pulmonary hypertension who presented with dyspnea and was  admitted for acute hypoxic respiratory failure 2/2 heart failure exacerbation.  On admission he was requiring 2 L of oxygen through nasal cannula to maintain O2 sats above 90 while he is not on any supplemental oxygen at home.  Exam was consistent with volume overload and BNP was elevated at 323 although lower than prior heart failure exacerbations when it was 3500.  Echocardiogram showed LVEF of 70 to 75% with hyperdynamic function, severe LVH, pulmonary hypertension with enlarged right ventricle with moderately reduced function and severe biatrial dilation.  This was discussed with cardiology and appeared stable from prior overall especially the right side of the heart.  They do not recommend any medication changes.  He responded well to IV diuresis and was quickly titrated down to room air.  He appears stable for discharge with no planned medication changes but developed an AKI.  This was due to mild overdiuresis and low p.o. intake of fluids compared to home.  After increasing p.o. intake and holding Lasix for 1 day his kidney function improved.  On discharge he was advised to hold his mineralocorticoid antagonist, start a low dose of potassium supplementation, and will come to the [facility] for BMP and blood pressure check on Tuesday 12/17.   Discharge Subjective: Patient is doing well this morning without any new or worsening complaints.  He has been walking around without the need of oxygen.  Discharge Exam:   BP 135/72 (BP Location: Left Arm)   Pulse 89   Temp 98.3 F (36.8 C)   Resp 18   Ht 5' 5.98" (1.676 m)   Wt 69.4 kg   SpO2 97%   BMI 24.71 kg/m  Constitutional: well-appearing elderly male sitting in bed, in no acute distress Pulmonary/Chest: normal work of breathing on room air, lungs clear to auscultation bilaterally Neurological: alert & oriented x 3, no focal deficits  Pertinent Labs, Studies, and Procedures:     Latest Ref Rng & Units 06/20/2023    7:32 AM 06/19/2023    8:36 AM  09/12/2022   11:29 AM  CBC  WBC 4.0 - 10.5 K/uL 9.1  10.9  9.8   Hemoglobin 13.0 - 17.0 g/dL 46.9  62.9  52.8   Hematocrit 39.0 - 52.0 % 38.6  36.3  42.4   Platelets 150 - 400 K/uL 120  117  136        Latest Ref Rng & Units 06/22/2023    7:48 AM 06/21/2023    1:25 PM 06/21/2023    6:24 AM  CMP  Glucose 70 - 99 mg/dL 413  244  010   BUN 8 - 23 mg/dL 33  34  29   Creatinine 0.61 - 1.24 mg/dL 2.72  5.36  6.44   Sodium 135 - 145 mmol/L 138  139  141   Potassium 3.5 - 5.1 mmol/L 3.4  3.5  2.9   Chloride 98 - 111 mmol/L 104  103  105   CO2 22 -  32 mmol/L 23  25  26    Calcium 8.9 - 10.3 mg/dL 9.2  9.6  9.3     ECHOCARDIOGRAM COMPLETE Result Date: 06/20/2023    ECHOCARDIOGRAM REPORT   Patient Name:   ANDARIUS DERSHEM Date of Exam: 06/20/2023 Medical Rec #:  [id number]    Height:       66.0 in Accession #:    [id number]   Weight:       153.0 lb Date of Birth:  1945/08/25    BSA:          1.785 m Patient Age:    77 years     BP:           168/98 mmHg Patient Gender: M            HR:           92 bpm. Exam Location:  Inpatient Procedure: 2D Echo, Cardiac Doppler and Color Doppler Indications:    Dyspnea  History:        Patient has prior history of Echocardiogram examinations, most                 recent 08/04/2021. CAD, Arrythmias:Atrial Fibrillation; Risk                 Factors:Hypertension, Diabetes, Sleep Apnea and Non-Smoker.  Sonographer:    Karma Ganja Referring Phys: Rocky Morel IMPRESSIONS  1. Left ventricular ejection fraction, by estimation, is 70 to 75%. The left ventricle has hyperdynamic function. The left ventricle has no regional wall motion abnormalities. There is severe left ventricular hypertrophy. Left ventricular diastolic parameters are indeterminate.  2. Right ventricular systolic function is moderately reduced. The right ventricular size is severely enlarged. There is moderately elevated pulmonary artery systolic pressure. The estimated right ventricular systolic pressure is 50.6  mmHg.  3. Left atrial size was severely dilated.  4. Right atrial size was severely dilated.  5. The mitral valve is grossly normal. Trivial mitral valve regurgitation. No evidence of mitral stenosis.  6. The aortic valve is abnormal. There is moderate calcification of the aortic valve. There is moderate thickening of the aortic valve. Aortic valve regurgitation is not visualized. Aortic valve sclerosis/calcification is present, without any evidence of aortic stenosis.  7. The inferior vena cava is normal in size with greater than 50% respiratory variability, suggesting right atrial pressure of 3 mmHg. FINDINGS  Left Ventricle: Left ventricular ejection fraction, by estimation, is 70 to 75%. The left ventricle has hyperdynamic function. The left ventricle has no regional wall motion abnormalities. The left ventricular internal cavity size was normal in size. There is severe left ventricular hypertrophy. Left ventricular diastolic parameters are indeterminate. Right Ventricle: The right ventricular size is severely enlarged. No increase in right ventricular wall thickness. Right ventricular systolic function is moderately reduced. There is moderately elevated pulmonary artery systolic pressure. The tricuspid regurgitant velocity is 3.45 m/s, and with an assumed right atrial pressure of 3 mmHg, the estimated right ventricular systolic pressure is 50.6 mmHg. Left Atrium: Left atrial size was severely dilated. Right Atrium: Right atrial size was severely dilated. Pericardium: There is no evidence of pericardial effusion. Mitral Valve: The mitral valve is grossly normal. Trivial mitral valve regurgitation. No evidence of mitral valve stenosis. Tricuspid Valve: The tricuspid valve is normal in structure. Tricuspid valve regurgitation is mild . No evidence of tricuspid stenosis. Aortic Valve: The aortic valve is abnormal. There is moderate calcification of the aortic valve. There is moderate thickening of  the aortic valve.  Aortic valve regurgitation is not visualized. Aortic valve sclerosis/calcification is present, without any evidence of aortic stenosis. Aortic valve mean gradient measures 4.2 mmHg. Aortic valve peak gradient measures 8.4 mmHg. Aortic valve area, by VTI measures 1.95 cm. Pulmonic Valve: The pulmonic valve was grossly normal. Pulmonic valve regurgitation is trivial. No evidence of pulmonic stenosis. Aorta: The aortic root is normal in size and structure. Venous: The inferior vena cava is normal in size with greater than 50% respiratory variability, suggesting right atrial pressure of 3 mmHg. IAS/Shunts: No atrial level shunt detected by color flow Doppler.  LEFT VENTRICLE PLAX 2D LVIDd:         3.60 cm   Diastology LVIDs:         1.90 cm   LV e' medial:    3.34 cm/s LV PW:         1.80 cm   LV E/e' medial:  29.5 LV IVS:        1.50 cm   LV e' lateral:   13.67 cm/s LVOT diam:     2.10 cm   LV E/e' lateral: 7.2 LV SV:         44 LV SV Index:   25 LVOT Area:     3.46 cm  RIGHT VENTRICLE            IVC RV Basal diam:  4.80 cm    IVC diam: 2.00 cm RV S prime:     7.65 cm/s TAPSE (M-mode): 1.2 cm LEFT ATRIUM             Index        RIGHT ATRIUM           Index LA diam:        5.20 cm 2.91 cm/m   RA Area:     25.30 cm LA Vol (A2C):   93.0 ml 52.11 ml/m  RA Volume:   78.30 ml  43.88 ml/m LA Vol (A4C):   77.4 ml 43.37 ml/m LA Biplane Vol: 86.8 ml 48.64 ml/m  AORTIC VALVE AV Area (Vmax):    1.85 cm AV Area (Vmean):   1.98 cm AV Area (VTI):     1.95 cm AV Vmax:           144.60 cm/s AV Vmean:          93.760 cm/s AV VTI:            0.227 m AV Peak Grad:      8.4 mmHg AV Mean Grad:      4.2 mmHg LVOT Vmax:         77.40 cm/s LVOT Vmean:        53.567 cm/s LVOT VTI:          0.128 m LVOT/AV VTI ratio: 0.56  AORTA Ao Root diam: 3.10 cm Ao Asc diam:  3.00 cm MITRAL VALVE               TRICUSPID VALVE MV Area (PHT): 3.73 cm    TR Peak grad:   47.6 mmHg MV Decel Time: 203 msec    TR Vmax:        345.00 cm/s MV E  velocity: 98.30 cm/s                            SHUNTS  Systemic VTI:  0.13 m                            Systemic Diam: 2.10 cm Weston Brass MD Electronically signed by Weston Brass MD Signature Date/Time: 06/20/2023/5:14:05 PM    Final    CT Angio Chest PE W/Cm &/Or Wo Cm Result Date: 06/19/2023 CLINICAL DATA:  Pulmonary embolism (PE) suspected, low to intermediate prob, positive D-dimer. EXAM: CT ANGIOGRAPHY CHEST WITH CONTRAST TECHNIQUE: Multidetector CT imaging of the chest was performed using the standard protocol during bolus administration of intravenous contrast. Multiplanar CT image reconstructions and MIPs were obtained to evaluate the vascular anatomy. RADIATION DOSE REDUCTION: This exam was performed according to the departmental dose-optimization program which includes automated exposure control, adjustment of the mA and/or kV according to patient size and/or use of iterative reconstruction technique. CONTRAST:  75mL OMNIPAQUE IOHEXOL 350 MG/ML SOLN COMPARISON:  Chest radiograph 06/19/2023. CT chest/abdomen/pelvis 01/01/2021. FINDINGS: Cardiovascular: Satisfactory opacification of the pulmonary arteries to the segmental level. No evidence of pulmonary embolism. Mild cardiomegaly. No pericardial effusion. Atherosclerotic calcifications of the thoracic aorta and coronary arteries. Mediastinum/Nodes: Numerous mildly enlarged mediastinal and hilar lymph nodes, likely reactive. Lungs/Pleura: Mild interlobular septal thickening and mosaic attenuation of the lungs, consistent with mild pulmonary edema. Trace bilateral pleural effusions with adjacent atelectasis in the lung bases. No pneumothorax. Upper Abdomen: No acute abnormality. Musculoskeletal: No chest wall abnormality. No acute or significant osseous findings. Review of the MIP images confirms the above findings. IMPRESSION: 1. No evidence of pulmonary embolism. 2. Mild cardiomegaly with mild pulmonary edema and trace  bilateral pleural effusions. 3. Numerous mildly enlarged mediastinal and hilar lymph nodes, likely reactive. 4. Coronary artery calcifications. Aortic Atherosclerosis (ICD10-I70.0). Electronically Signed   By: Orvan Falconer M.D.   On: 06/19/2023 14:06   DG Chest 2 View Result Date: 06/19/2023 CLINICAL DATA:  Shortness of breath EXAM: CHEST - 2 VIEW COMPARISON:  10/09/2021 FINDINGS: Chronic cardiopericardial enlargement. Fissural thickening especially seen on the lateral view. No Kerley lines or consolidation. Trace pleural fluid on the left. IMPRESSION: Cardiomegaly with mild fissure thickening and trace left pleural fluid, question early CHF. Electronically Signed   By: Tiburcio Pea M.D.   On: 06/19/2023 07:40     Discharge Instructions: Discharge Instructions     Diet - low sodium heart healthy   Complete by: As directed    Increase activity slowly   Complete by: As directed        Signed: Rocky Morel, DO Internal Medicine Resident, PGY-2 Pager# [phone] 06/22/2023, 1:44 PM   Please contact the on call pager after 5 pm and on weekends at [phone].

## 2023-06-22 NOTE — Plan of Care (Signed)

## 2023-06-22 NOTE — Care Management (Signed)
Requested by MD to notify the DME company [facility]) that the patient will not need home oxygen. Requested the MD cancel the DME home oxygen order, this has been done, I have called [facility] to notify them that the patient no longer needs home oxygen.

## 2023-06-24 ENCOUNTER — Ambulatory Visit: Payer: Medicare HMO

## 2023-06-26 ENCOUNTER — Ambulatory Visit: Payer: Medicare HMO | Admitting: Internal Medicine

## 2023-06-26 VITALS — BP 147/84 | HR 74 | Temp 98.2°F | Ht 66.0 in | Wt 155.0 lb

## 2023-06-26 DIAGNOSIS — J9601 Acute respiratory failure with hypoxia: Secondary | ICD-10-CM

## 2023-06-26 DIAGNOSIS — I1A0 Resistant hypertension: Secondary | ICD-10-CM | POA: Diagnosis not present

## 2023-06-26 DIAGNOSIS — I421 Obstructive hypertrophic cardiomyopathy: Secondary | ICD-10-CM

## 2023-06-26 DIAGNOSIS — I5022 Chronic systolic (congestive) heart failure: Secondary | ICD-10-CM | POA: Diagnosis not present

## 2023-06-26 NOTE — Patient Instructions (Signed)
Mr. Demetry, Parmeter Christmas!  You're looking good.  Keep up the good work.  You have an appointment with the cardiologist on 07/04/23 for an additional set of eyes.  Keep taking your medications as you are (just stop the potassium).  Take care and stay well,  Dr. Mayford Knife

## 2023-06-26 NOTE — Progress Notes (Signed)
77 y.o. Dylan [name] is here for hospital f/u of hypoxic respiratory failure associated with pulmonary edema, resolved with diuresis.  Also some question regarding a URI, and [name] himself blames the incident on an acute GI illness after eating spoiled food.  He has felt back to his baseline since the day of discharge 4 days ago and today has no new complaints.  He didn't realize he was to hold off on taking his eplerenone (to allow for improvement in AKI) until today's visit, and has been taking this since discharge.  He also has been taking the potassium supplement on which he was discharged.  He adamantly declined blood tests today "I had so many needles in the hospital I'm not doing that today.  He cancelled an echocardiagram which had been ordered for yesterday "I just had one in the hospital, I didn't think I needed another one".  A cardiology hospital f/u had been scheduled for him on 12/27, of which he was unaware.   Since my last visit with him on 05/01/2023, patient has seen cardiologist Dr. Scharlene Gloss on 11/13/204 where he was found to be stable; no changes in regimen.    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), nocturnal 08/03/2021   Subareolar gynecomastia in male due to spironolactone, resolved 04/09/2021   Secondary hyperparathyroidism of renal origin ([name]) 01/25/2021   RBBB 11/23/2020   History of GI  diverticular bleed 11/21/2020   History of BPH 09/14/2020   Healthcare maintenance 09/13/2020   Urge urinary incontinence 06/29/2020   Refractory obstruction of nasal airway 04/27/2020   Aortic atherosclerosis ([name]) 03/28/2020   Stage 3b chronic kidney disease (CKD) ([name]) 03/09/2020   Coronary artery disease without angina pectoris 04/04/2018   Thrombocytopenia ([name]) 10/09/2016   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   Atherosclerosis 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 ([name]) 12/28/2014   Gout 10/19/2014   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, Disp: , Rfl:    allopurinol (ZYLOPRIM) 100 MG tablet, Take 1 tablet (100 mg total) by mouth daily., Disp: 90 tablet, Rfl: 3   amLODipine (NORVASC) 10 MG tablet, Take 1 tablet (10 mg total) by mouth daily., Disp: 90 tablet, Rfl: 3   apixaban (ELIQUIS) 5 MG TABS tablet, Take 1 tablet (5 mg total) by mouth 2 (two) times daily., Disp: 60 tablet, Rfl: 11   atorvastatin (LIPITOR) 40 MG tablet, Take 1 tablet (40 mg total) by mouth daily., Disp: 90 tablet, Rfl: 3   [  Paused] eplerenone (INSPRA) 50 MG tablet, Take 1 tablet (50 mg total) by mouth daily., Disp: 90 tablet, Rfl: 3   FREESTYLE LITE test strip, 1 each by Other route in the morning., Disp: 100 each, Rfl: 3   furosemide (LASIX) 40 MG tablet, Take 1 tablet (40 mg total) by mouth daily., Disp: 90 tablet, Rfl: 3   hydrALAZINE  (APRESOLINE) 50 MG tablet, Take 1 tablet (50 mg total) by mouth 3 (three) times daily., Disp: 90 tablet, Rfl: 5   ipratropium (ATROVENT) 0.03 % nasal spray, Place 2 sprays into both nostrils 3 (three) times daily as needed for rhinitis., Disp: , Rfl:    latanoprost (XALATAN) 0.005 % ophthalmic solution, Place 1 drop into both eyes at bedtime., Disp: , Rfl:    metFORMIN (GLUCOPHAGE) 500 MG tablet, Take 1 tablet (500 mg total) by mouth 2 (two) times daily with a meal., Disp: 180 tablet, Rfl: 3   potassium chloride SA (KLOR-CON M) 20 MEQ tablet, Take 1 tablet (20 mEq total) by mouth daily., Disp: 10 tablet, Rfl: 0  Functional Status: Independent in ADLs, assisted with IADLs.  Family provides transportation to appts. Intermittent use of assistive device (he has done much better in recent months).  No falls.  Seems to be having more difficulty with dates and events during today's visit.  HE has very close family involvement and supervision.   Objective BP (!) 147/84 (BP Location: Right Arm, Patient Position: Sitting, Cuff Size: Small)   Pulse 74   Temp 98.2 F (36.8 C) (Oral)   Ht 5\' 6"  (1.676 m)   Wt 155 lb 0.2 oz (70.3 kg)   SpO2 99%   BMI 25.02 kg/m   Exam: Well appearing man in no distress, accompanied by his granddaughter who drove today.  Neck veins flat.  Lungs clear.  On RA.  HRRR despite hx of CAF.  Systolic murmur stable, prominent.  No LE edema.  Feet in good condition.  Toenails trimmed today. Walks w/o assistive device.   Assessment and Plan: Acute hypoxic respiratory failure [facility]) Assessment & Plan: Hospitalized 12/12-12/15 for RA saturations mid 80s which responded to gentle diuresis.  There had been no change in stable medications; etiologies considered were mild URI or salt intake.  Patient endorsed feeling short of breath when he developed acute diarrheal illness upon eating spoiled food.  Symptoms and oxygen requirement completely resolved in hospital and he is feeling well.   Echo unchanged. He has continued the eplerenone and has cardiology hosp f/u on 12/27.  I stopped the potassium supplement.  He adamantly declines blood work today but will entertain it next week at the cardiologist.   Return in about 3 months (around 09/24/2023) for chronic condition monitoring, med review.

## 2023-06-27 ENCOUNTER — Ambulatory Visit (HOSPITAL_COMMUNITY): Payer: Medicare HMO

## 2023-06-27 MED ORDER — EPLERENONE 50 MG PO TABS: 50.0000 mg | ORAL_TABLET | Freq: Every day | ORAL | 3 refills | Status: DC

## 2023-06-27 NOTE — Assessment & Plan Note (Addendum)
Hospitalized 12/-12/15 for RA saturations mid 80s which responded to gentle diuresis.  There had been no change in stable medications; etiologies considered were mild URI or salt intake.  Patient endorsed feeling short of breath when he developed acute diarrheal illness upon eating spoiled food.  Symptoms and oxygen requirement completely resolved in hospital and he is feeling well.  Echo unchanged. He has continued the eplerenone and has cardiology hosp f/u on 12/27.  I stopped the potassium supplement.  He adamantly declines blood work today but will entertain it next week at the cardiologist.

## 2023-06-30 NOTE — Progress Notes (Addendum)
HEART & VASCULAR TRANSITION OF CARE CONSULT NOTE     Referring Physician: Dr. Sol Blazing Primary Care: Dr. Charissa Bash Primary Cardiologist: Dr. Flora Lipps  HPI: Referred to clinic by Dr. Sol Blazing with [facility] Internal Medicine teaching service for heart failure consultation. 77 y.o. male with history of hypertrophic cardiomyopathy/chronic systolic CHF, CAD, CKD IIIb, persistent atrial fibrillation, pulmonary hypertension, DM, cirrhosis, HTN, nocturnal CHB.  PYP scan 09/22 equivocal for amyloid.  cMRI 09/22: LVEF 25%, RVEF 34%, asymmetric LV hypertrophy measuring up to 21 mm in basal septum consistent with HCM, patchy LGE in basal septum and RV insertion site also consistent with HCM (LGE 3% of myocardial mass).  Echo 01/23: EF 30-35%, RV severely reduced, RVSP 61 mmHg, severe BAE  R/LHC 02/23: 85% ostial to proximal D2, 45% mLAD, RA mean 10, PA mean 36, PCWP mean 8, Fick CI 2.6, PVR 6 WU.  He was admitted earlier this month with acute respiratory failure 2/2 acute on chronic CHF. Echo with LVEF 70-75%, hyperdynamic LV, severe LVH, severely enlarged RV w/ moderately reduced function, RVSP 51 mmHg, severe BAE. He was diuresed with IV lasix. Had AKI d/t overdiuresis.   He is here today for CHF hospital follow-up. Has been doing well from HF standpoint. Dyspnea improved. No orthopnea, PND or lower extremity edema. Reports compliance with medications. Has been trying to limit sodium intake. BP tends to run 150s/90s before meds, 140s systolic after meds. States he cannot tolerate CPAP.   Past Medical History:  Diagnosis Date   Anemia 10/18/2014   Baseline about 12 and stable from 2010 to 2016. Colon 2009 in [place] (records cannot be obtained).  EGD Dr Elnoria Howard 2011 nl    Aortic atherosclerosis ([name]) 03/28/2020   Incidental finding on imaging CT    Atherosclerosis 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 ([name]) 12/28/2014   ABI Feb 2017 R 0.49; L 0.72 with diffuse dz ABI Aug 2017  R 0.49; L 0.69 ABI Jan 2018 R 0.61; L 0.73  ABI Feb 2019 R 0.55; L 0.71 Sees Dr Imogene Burn - recs ABI q 6 months   Chronic diastolic 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 ([name]) 10/18/2014   Noted ECHO 10/2014. Grade 2. EF 50-55%; echo repeated 2019, severe hypertrophy with elevated filling pressures   Chronic kidney disease, stage 3b ([name]) 03/09/2020   Dr. Hyman Hopes  Nephrologist, f/u Q 51M   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) 10/18/2014   Noted ECHO 10/2014. Grade 2. EF 50-55%; echo repeated 2019, severe hypertrophy with elevated filling pressures  Repeat ECHO 08/04/21 showed EF 30-35%, severely elevated PASP, severely reduced RVSF, severely dilated LA and RA, small pericardial effusion     Coronary artery disease without angina pectoris    Degloving injury of finger 03/28/2020   10/21/19: copied from op note "1.  Open reduction percutaneous pinning left small finger proximal phalanx fracture 2.  Complex repair of laceration to left small finger 3 cm in length 3.  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 No significant callus formation is noted. Pins had been removed   Diverticulosis 10/08/2014   Seen on CT. Reportedly on Colon in [place] in 2009. Freq bouts of diverticulitis.   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 Stable from 2010 to 2016.  Negative SPEP and UPEP 2014 after ARF 2/2 continued ACE (lisinopril 20) use while vol contracted. 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 Failed CPAP on study. BiPA inspiratory pressure of 21 and expiratory pressure of 17 CWP. Consider ENT evaluation for potentially correctable upper airway obstruction contributing to the need for unusually high pressures - ENT July 2015 did not feel any intervention surgically was indicated. He wore a large F&P Simplus fullfac   Personal history of colonic polyps 03/28/2014   Dr. Elnoria Howard, 2 polyps removed 2015.  No polyps on f/u in 2020.  No further surveillance suggested per Dr. Elnoria Howard.   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  He is unable to breathe through his nose comfortably.  This is part of the reason why he does not wear his oxygen regularly.  Nasal passages are nearly completely obstructed erythematous smooth glistening surface.  He has been told by his eye doctor that part of the reason his e   Resistant hypertension 10/09/2014   Poor control with 6 drug therapy. 2016 : Aldo 37 but ARR 23.5    Severe pulmonary arterial systolic hypertension ([name]) 10/18/2014   Noted as severe ECHO 2016 and 2019. Likely 2/2 severe untreated OSA. Pt is not adherent to CPAP.   Stage 3b chronic kidney disease (CKD) ([name]) 03/09/2020   Dr. Hyman Hopes  Nephrologist, f/u Q 19M     Syncope 12/31/2020   Thrombocytopenia ([name]) 10/09/2016   Nl liver and spleen on [place] 2019   Ulcer aphthous oral 04/27/2020   Evaluated emergency room last week, saw with Magic mouthwash   Unintentional weight loss due to Trulicity, resolved 11/28/2020    Current Outpatient Medications  Medication Sig Dispense Refill   albuterol (VENTOLIN HFA) 108 (90 Base) MCG/ACT inhaler Inhale into the  lungs every 6 (six) hours as needed for wheezing or shortness of breath.     allopurinol (ZYLOPRIM) 100 MG tablet Take 1 tablet (100 mg total) by mouth daily. 90 tablet 3   amLODipine (NORVASC) 10 MG tablet Take 1 tablet (10 mg total) by mouth daily. 90 tablet 3   apixaban (ELIQUIS) 5 MG TABS tablet Take 1 tablet (5 mg total) by mouth 2 (two) times daily. 60 tablet 11   atorvastatin (LIPITOR) 40 MG tablet Take 1 tablet (40 mg total) by mouth daily. 90 tablet 3   eplerenone (INSPRA) 50 MG tablet Take 1 tablet (50 mg total) by mouth daily. 90 tablet 3   FREESTYLE LITE test strip 1 each by Other route in the morning. 100 each 3   furosemide (LASIX) 40 MG tablet Take 1 tablet (40 mg total) by mouth daily. 90 tablet 3   ipratropium (ATROVENT) 0.03 % nasal spray Place 2 sprays into both nostrils 3 (three) times daily as needed for rhinitis.     latanoprost (XALATAN) 0.005 % ophthalmic solution Place 1 drop into both eyes at bedtime.  metFORMIN (GLUCOPHAGE) 500 MG tablet Take 1 tablet (500 mg total) by mouth 2 (two) times daily with a meal. 180 tablet 3   hydrALAZINE (APRESOLINE) 50 MG tablet Take 1.5 tablets (75 mg total) by mouth 3 (three) times daily. 135 tablet 3   No current facility-administered medications for this encounter.    Allergies  Allergen Reactions   Ace Inhibitors Other (See Comments)    "ARF - see CRF overview"   Spironolactone Other (See Comments)    Gynecomastia per pt report      Social History   Socioeconomic History   Marital status: Divorced    Spouse name: Not on file   Number of children: 4   Years of education: Not on file   Highest education level: 6th grade  Occupational History   Occupation: retired  Tobacco Use   Smoking status: Former    Current packs/day: 0.00    Average packs/day: 0.5 packs/day for 20.0 years (10.0 ttl pk-yrs)    Types: Cigarettes    Start date: 10/02/1952    Quit date: 10/02/1972    Years since quitting: 50.7   Smokeless  tobacco: Never  Vaping Use   Vaping status: Never Used  Substance and Sexual Activity   Alcohol use: No    Alcohol/week: 0.0 standard drinks of alcohol   Drug use: No   Sexual activity: Never  Other Topics Concern   Not on file  Social History Narrative   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 Had yearly occupational testing inc PFT's. Now retired. Divorced. Has male sig other. Likes to go fishing.      He starting drinking as a teen and would drink to passing out. Couldn't keep job, have family. He quit drinking and smoking cold [place] with help of his faith. No ETOH since about age 28ish      Total of 7 brothers and 6 sisters.   Social Drivers of Corporate investment banker Strain: Low Risk  (06/20/2023)   Overall Financial Resource Strain (CARDIA)    Difficulty of Paying Living Expenses: Not very hard  Food Insecurity: No Food Insecurity (06/19/2023)   Hunger Vital Sign    Worried About Running Out of Food in the Last Year: Never true    Ran Out of Food in the Last Year: Never true  Transportation Needs: No Transportation Needs (06/20/2023)   PRAPARE - Administrator, Civil Service (Medical): No    Lack of Transportation (Non-Medical): No  Physical Activity: Insufficiently Active (09/12/2022)   Exercise Vital Sign    Days of Exercise per Week: 3 days    Minutes of Exercise per Session: 30 min  Stress: No Stress Concern Present (09/12/2022)   [employer] of Occupational Health - Occupational Stress Questionnaire    Feeling of Stress : Not at all  Social Connections: Unknown (09/12/2022)   Social Connection and Isolation Panel [NHANES]    Frequency of Communication with Friends and Family: Patient declined    Frequency of Social Gatherings with Friends and Family: Patient declined    Attends Religious Services: Patient declined    Database administrator or Organizations: No    Attends Banker Meetings: Patient declined    Marital Status: Divorced   Catering manager Violence: Not At Risk (06/19/2023)   Humiliation, Afraid, Rape, and Kick questionnaire    Fear of Current or Ex-Partner: No    Emotionally Abused: No    Physically Abused: No    Sexually  Abused: No      Family History  Problem Relation Age of Onset   Heart failure Sister        Died of complications 05-20-2016  CAD Sister        CABG x3 while in her 78's    Vitals:   07/04/23 1103  BP: (!) 160/82  Pulse: 86  SpO2: 96%  Weight: 71.4 kg (157 lb 6.4 oz)  Height: 5\' 6"  (1.676 m)    PHYSICAL EXAM: General:  Well appearing elderly male HEENT: normal Neck: supple. no JVD.  Cor: PMI nondisplaced. Irregularly irregular rhythm. No rubs, gallops or murmurs. Lungs: clear Abdomen: soft, nontender, nondistended.  Extremities: no cyanosis, clubbing, rash, edema Neuro: alert & oriented x 3 Affect pleasant.  ECG: Afib 84 bpm, RBBB   ASSESSMENT & PLAN: Hypertrophic cardiomyopathy/Chronic diastolic CHF (previously HFrEF with recovered EF) w/ RV failure -PYP scan 09/22 equivocal for amyloid. -cMRI 09/22: LVEF 25%, RVEF 34%, asymmetric LV hypertrophy measuring up to 21 mm in basal septum consistent with HCM, patchy LGE in basal septum and RV insertion site also consistent with HCM (LGE 3% of myocardial mass). -Echo 01/23: EF 30-35%, RV severely reduced, RVSP 61 mmHg, severe BAE -R/LHC 02/23: 85% ostial to proximal D2, 45% mLAD, RA mean 10, PA mean 36, PCWP mean 8, Fick CI 2.6, PVR 6 WU. -Echo 12/24: LVEF 70-75%, hyperdynamic LV, severe LVH, severely enlarged RV w/ moderately reduced function, RVSP 51 mmHg, severe BAE -Reports his Cardiologist previously discussed family screening for HCM with him -NYHA II. Volume looks good. On lasix 40 mg daily -Continue Eplerenone 50 mg daily -Has not been on ACE/ARB d/t kidney disease -Increased hydralazine to 75 TID for blood pressure control. -Consider SGLT2i next -He is adamant he will not use any devices to treat his sleep  apnea  2. Persistent atrial fibrillation Nocturnal complete heart block -Rate controlled today. Off beta blocker.  -Previously seen by EP and not felt to be candidate for ICD/PPM -Untreated OSA may be playing a role -On Eliquis 5 BID  3. Pulmonary hypertension -Last RHC 02/23 as above -? D/t OSA. Severe w/ AHI of 131 on sleep study in 2019.  4. CAD -LHC 02/23: 45% m LAD and 85% small D2. CAD treated medically -On atorvastatin. No aspirin with anticoagulation. -No angina.  5. CKD IIIb -Cr variable, baseline seems to be 1.5-2. Up to 2.25 during recent admit in setting of overdiuresis. -Previously followed with  K[facility] but prior Nephrologist left the practice. Suggest reestablishing care.  -Consider SGLT2i   6. HTN -Hx of resistant hypertension.  -Suspect untreated OSA contributing. -Had elevated aldosterone/renin ratio in 2022 and was started on eplerenone.  -Increased hydralazine today. -Refer to HTN clinic     Referred to HFSW (PCP, Medications, Transportation, ETOH Abuse, Drug Abuse, Insurance, Financial ): No Refer to Pharmacy: No Refer to Home Health: No Refer to [facility] Clinic: No Refer to General Cardiology: No, already established  Follow up  As scheduled with Dr. Flora Lipps in 02/25, referred to HTN clinic

## 2023-07-04 ENCOUNTER — Encounter (HOSPITAL_COMMUNITY): Payer: Self-pay

## 2023-07-04 ENCOUNTER — Other Ambulatory Visit (HOSPITAL_COMMUNITY): Payer: Self-pay

## 2023-07-04 ENCOUNTER — Ambulatory Visit (HOSPITAL_COMMUNITY)
Admit: 2023-07-04 | Discharge: 2023-07-04 | Disposition: A | Discharge status: Home / Self Care | Payer: Medicare HMO | Source: Ambulatory Visit | Attending: Physician Assistant | Admitting: Physician Assistant

## 2023-07-04 VITALS — BP 160/82 | HR 86 | Ht 66.0 in | Wt 157.4 lb

## 2023-07-04 DIAGNOSIS — I272 Pulmonary hypertension, unspecified: Secondary | ICD-10-CM | POA: Diagnosis not present

## 2023-07-04 DIAGNOSIS — Z79899 Other long term (current) drug therapy: Secondary | ICD-10-CM | POA: Insufficient documentation

## 2023-07-04 DIAGNOSIS — I5032 Chronic diastolic (congestive) heart failure: Secondary | ICD-10-CM | POA: Insufficient documentation

## 2023-07-04 DIAGNOSIS — I130 Hypertensive heart and chronic kidney disease with heart failure and stage 1 through stage 4 chronic kidney disease, or unspecified chronic kidney disease: Secondary | ICD-10-CM | POA: Insufficient documentation

## 2023-07-04 DIAGNOSIS — I422 Other hypertrophic cardiomyopathy: Secondary | ICD-10-CM | POA: Diagnosis not present

## 2023-07-04 DIAGNOSIS — I2510 Atherosclerotic heart disease of native coronary artery without angina pectoris: Secondary | ICD-10-CM | POA: Diagnosis not present

## 2023-07-04 DIAGNOSIS — Z7901 Long term (current) use of anticoagulants: Secondary | ICD-10-CM | POA: Insufficient documentation

## 2023-07-04 DIAGNOSIS — Z87891 Personal history of nicotine dependence: Secondary | ICD-10-CM | POA: Diagnosis not present

## 2023-07-04 DIAGNOSIS — Z7984 Long term (current) use of oral hypoglycemic drugs: Secondary | ICD-10-CM | POA: Diagnosis not present

## 2023-07-04 DIAGNOSIS — K7460 Unspecified cirrhosis of liver: Secondary | ICD-10-CM | POA: Insufficient documentation

## 2023-07-04 DIAGNOSIS — I421 Obstructive hypertrophic cardiomyopathy: Secondary | ICD-10-CM | POA: Diagnosis not present

## 2023-07-04 DIAGNOSIS — I442 Atrioventricular block, complete: Secondary | ICD-10-CM | POA: Diagnosis not present

## 2023-07-04 DIAGNOSIS — N1832 Chronic kidney disease, stage 3b: Secondary | ICD-10-CM | POA: Insufficient documentation

## 2023-07-04 DIAGNOSIS — I10 Essential (primary) hypertension: Secondary | ICD-10-CM

## 2023-07-04 DIAGNOSIS — E1122 Type 2 diabetes mellitus with diabetic chronic kidney disease: Secondary | ICD-10-CM | POA: Insufficient documentation

## 2023-07-04 DIAGNOSIS — I4819 Other persistent atrial fibrillation: Secondary | ICD-10-CM | POA: Diagnosis not present

## 2023-07-04 LAB — COMPREHENSIVE METABOLIC PANEL
ALT: 10 U/L (ref 0–44)
AST: 14 U/L — ABNORMAL LOW (ref 15–41)
Albumin: 4.2 g/dL (ref 3.5–5.0)
Alkaline Phosphatase: 45 U/L (ref 38–126)
Anion gap: 10 (ref 5–15)
BUN: 25 mg/dL — ABNORMAL HIGH (ref 8–23)
CO2: 21 mmol/L — ABNORMAL LOW (ref 22–32)
Calcium: 9.7 mg/dL (ref 8.9–10.3)
Chloride: 110 mmol/L (ref 98–111)
Creatinine, Ser: 1.83 mg/dL — ABNORMAL HIGH (ref 0.61–1.24)
GFR, Estimated: 37 mL/min — ABNORMAL LOW (ref >60)
Glucose, Bld: 116 mg/dL — ABNORMAL HIGH (ref 70–99)
Potassium: 3.6 mmol/L (ref 3.5–5.1)
Sodium: 141 mmol/L (ref 135–145)
Total Bilirubin: 0.7 mg/dL (ref <1.2)
Total Protein: 8 g/dL (ref 6.5–8.1)

## 2023-07-04 LAB — BRAIN NATRIURETIC PEPTIDE: B Natriuretic Peptide: 302.9 pg/mL — ABNORMAL HIGH (ref 0.0–100.0)

## 2023-07-04 MED ORDER — HYDRALAZINE HCL 50 MG PO TABS: 75.0000 mg | ORAL_TABLET | Freq: Three times a day (TID) | ORAL | 3 refills | Status: DC

## 2023-07-04 NOTE — Patient Instructions (Addendum)
Medication Changes:  INCREASE HYDRALAZINE TO 75MG  THREE TIMES DAILY   Lab Work:  Labs done today, your results will be available in MyChart, we will contact you for abnormal readings.  YOU HAVE BEEN REFERRED TO [facility]  THEY WILL REACH OUT TO YOU OR CALL TO ARRANGE THIS. PLEASE CALL us WITH ANY CONCERNS   Follow-Up in: 4 weeks with Dr. Era Skeen CALL HIS OFFICE TO GET SCHEDULED [phone]  At the [facility], you and your health needs are our priority. We have a designated team specialized in the treatment of Heart Failure. This Care Team includes your primary Heart Failure Specialized Cardiologist (physician), Advanced Practice Providers (APPs- Physician Assistants and Nurse Practitioners), and Pharmacist who all work together to provide you with the care you need, when you need it.   You may see any of the following providers on your designated Care Team at your next follow up:  Dr. Arvilla Meres Dr. Marca Ancona Dr. Dorthula Nettles Dr. Theresia Bough Tonye Becket, NP Robbie Lis, [place] [facility] [place], [place] Brynda Peon, NP [place] Lee, NP Karle Plumber, PharmD   Please be sure to bring in all your medications bottles to every appointment.   Need to Contact us:  If you have any questions or concerns before your next appointment please send [place] a message through [place] or call our office at [phone].    TO LEAVE A MESSAGE FOR THE NURSE SELECT OPTION 2, PLEASE LEAVE A MESSAGE INCLUDING: YOUR NAME DATE OF BIRTH CALL BACK NUMBER REASON FOR CALL**this is important as we prioritize the call backs  YOU WILL RECEIVE A CALL BACK THE SAME DAY AS LONG AS YOU CALL BEFORE 4:00 PM

## 2023-08-18 NOTE — Progress Notes (Deleted)
 Cardiology Office Note:  .   Date:  08/18/2023  ID:  Dylan [name], DOB 1945/11/16, MRN [id number] PCP: Miguel Aschoff, MD  [facility] Providers Cardiologist:  Reatha Harps, MD { Click to update primary MD,subspecialty MD or APP then REFRESH:1}   History of Present Illness: .   No chief complaint on file.   Dylan [name] is a 78 y.o. male with history of HCM, pHTN, cor pulmonale, HTN, CKD 3b, DM, CAD who presents for follow-up.      Problem List  Hypertrophic cardiomyopathy/Systolic HF -EF 50% echo 12/2020 -EF 25% on CMR (arrhythmia artifact?) 03/2021 -EF 30-35% 07/2021 -EF 70-75% 06/2023 -basal septum 21 mm -3% LGE 2. CAD -45% mLAD -85% D2 (08/09/2021) -T chol 107, HDL 51, LDL 37, TG 104 3. Paroxysmal Afib/flutter 4. CHB detected on monitor -nocturnal 02/18/2021 @ 4:02 AM 5. Diabetes -A1c 5.4 6. HTN 7. CKD 3b 8. Prior tobacco abuse -20 pack years  9. Pulmonary hypertension/Cor pulmonale  -mPAP 36 mmHG  -PVR 6.0 WU (PCWP 8) -2/2 untreated OSA 10. OSA -severe with hypoxemia  -no CPAP per his request  11. Cirrhosis     ROS: All other ROS reviewed and negative. Pertinent positives noted in the HPI.     Studies Reviewed: Marland Kitchen       LHC/RHC 08/09/2021   Mid LAD lesion is 45% stenosed.   2nd Diag lesion is 85% stenosed.   Single-vessel coronary obstructive disease with 45% mid LAD stenosis in the region of the takeoff of a small second diagonal vessel which had 85% ostial/proximal stenosis.   Normal left circumflex and RCA.   Moderate pulmonary hypertension with mean PA at 36 mm.   PVR: 6.0 WU   TTE 06/20/2023  1. Left ventricular ejection fraction, by estimation, is 70 to 75%. The  left ventricle has hyperdynamic function. The left ventricle has no  regional wall motion abnormalities. There is severe left ventricular  hypertrophy. Left ventricular diastolic  parameters are indeterminate.   2. Right ventricular systolic function is  moderately reduced. The right  ventricular size is severely enlarged. There is moderately elevated  pulmonary artery systolic pressure. The estimated right ventricular  systolic pressure is 50.6 mmHg.   3. Left atrial size was severely dilated.   4. Right atrial size was severely dilated.   5. The mitral valve is grossly normal. Trivial mitral valve  regurgitation. No evidence of mitral stenosis.   6. The aortic valve is abnormal. There is moderate calcification of the  aortic valve. There is moderate thickening of the aortic valve. Aortic  valve regurgitation is not visualized. Aortic valve  sclerosis/calcification is present, without any evidence  of aortic stenosis.   7. The inferior vena cava is normal in size with greater than 50%  respiratory variability, suggesting right atrial pressure of 3 mmHg.  Physical Exam:   VS:  There were no vitals taken for this visit.   Wt Readings from Last 3 Encounters:  07/04/23 157 lb 6.4 oz (71.4 kg)  06/26/23 155 lb 0.2 oz (70.3 kg)  06/22/23 153 lb (69.4 kg)    GEN: Well nourished, well developed in no acute distress NECK: No JVD; No carotid bruits CARDIAC: ***RRR, no murmurs, rubs, gallops RESPIRATORY:  Clear to auscultation without rales, wheezing or rhonchi  ABDOMEN: Soft, non-tender, non-distended EXTREMITIES:  No edema; No deformity  ASSESSMENT AND PLAN: .   ***    {Are you ordering a CV Procedure (e.g. stress test, cath,  DCCV, TEE, etc)?   Press F2        :[id number]}   Follow-up: No follow-ups on file.  Time Spent with Patient: I have spent a total of *** minutes caring for this patient today face to face, ordering and reviewing labs/tests, reviewing prior records/medical history, examining the patient, establishing an assessment and plan, communicating results/findings to the patient/family, and documenting in the medical record.   Signed, Lenna Gilford. Flora Lipps, MD, [facility]  [facility]  [address] [place], [place] [address] [phone]  8:48 PM

## 2023-08-20 ENCOUNTER — Ambulatory Visit: Payer: Medicare HMO | Attending: Cardiovascular Disease | Admitting: Cardiovascular Disease

## 2023-08-20 DIAGNOSIS — N1832 Chronic kidney disease, stage 3b: Secondary | ICD-10-CM

## 2023-08-20 DIAGNOSIS — I5022 Chronic systolic (congestive) heart failure: Secondary | ICD-10-CM

## 2023-08-20 DIAGNOSIS — E782 Mixed hyperlipidemia: Secondary | ICD-10-CM

## 2023-08-20 DIAGNOSIS — I442 Atrioventricular block, complete: Secondary | ICD-10-CM

## 2023-08-20 DIAGNOSIS — I2510 Atherosclerotic heart disease of native coronary artery without angina pectoris: Secondary | ICD-10-CM

## 2023-08-20 DIAGNOSIS — I4819 Other persistent atrial fibrillation: Secondary | ICD-10-CM

## 2023-08-20 DIAGNOSIS — I422 Other hypertrophic cardiomyopathy: Secondary | ICD-10-CM

## 2023-08-20 DIAGNOSIS — I2781 Cor pulmonale (chronic): Secondary | ICD-10-CM

## 2023-08-20 DIAGNOSIS — I10 Essential (primary) hypertension: Secondary | ICD-10-CM

## 2023-08-20 DIAGNOSIS — I272 Pulmonary hypertension, unspecified: Secondary | ICD-10-CM

## 2023-09-12 ENCOUNTER — Ambulatory Visit (INDEPENDENT_AMBULATORY_CARE_PROVIDER_SITE_OTHER): Payer: Medicare HMO | Admitting: Cardiovascular Disease

## 2023-09-12 ENCOUNTER — Encounter (HOSPITAL_BASED_OUTPATIENT_CLINIC_OR_DEPARTMENT_OTHER): Payer: Self-pay | Admitting: Cardiovascular Disease

## 2023-09-12 ENCOUNTER — Other Ambulatory Visit (HOSPITAL_BASED_OUTPATIENT_CLINIC_OR_DEPARTMENT_OTHER): Payer: Self-pay

## 2023-09-12 VITALS — BP 162/78 | HR 86 | Ht 66.0 in | Wt 159.2 lb

## 2023-09-12 DIAGNOSIS — E7841 Elevated Lipoprotein(a): Secondary | ICD-10-CM

## 2023-09-12 DIAGNOSIS — G4733 Obstructive sleep apnea (adult) (pediatric): Secondary | ICD-10-CM

## 2023-09-12 DIAGNOSIS — I700 Atherosclerosis of aorta: Secondary | ICD-10-CM

## 2023-09-12 DIAGNOSIS — I1A0 Resistant hypertension: Secondary | ICD-10-CM | POA: Diagnosis not present

## 2023-09-12 DIAGNOSIS — I5022 Chronic systolic (congestive) heart failure: Secondary | ICD-10-CM

## 2023-09-12 DIAGNOSIS — Z87891 Personal history of nicotine dependence: Secondary | ICD-10-CM

## 2023-09-12 DIAGNOSIS — I70213 Atherosclerosis of native arteries of extremities with intermittent claudication, bilateral legs: Secondary | ICD-10-CM | POA: Diagnosis not present

## 2023-09-12 DIAGNOSIS — I421 Obstructive hypertrophic cardiomyopathy: Secondary | ICD-10-CM

## 2023-09-12 DIAGNOSIS — N1832 Chronic kidney disease, stage 3b: Secondary | ICD-10-CM

## 2023-09-12 DIAGNOSIS — I2729 Other secondary pulmonary hypertension: Secondary | ICD-10-CM

## 2023-09-12 DIAGNOSIS — G478 Other sleep disorders: Secondary | ICD-10-CM

## 2023-09-12 MED ORDER — DOXAZOSIN MESYLATE 2 MG PO TABS: 2.0000 mg | ORAL_TABLET | Freq: Two times a day (BID) | ORAL | 1 refills | Status: DC

## 2023-09-12 NOTE — Patient Instructions (Signed)
 Medication Instructions:  START DOXAZOSIN 2 MG TWICE A DAY   Labwork: RENIN/ALDOSTERONE/TSH SOON-NEEDS TO BE DONE FIRST THING IN THE MORNING   Testing/Procedures: NONE   Follow-Up: 10/20/2023 2:00 PM WITH PHARM D   Any Other Special Instructions Will Be Listed Below (If Applicable).     If you need a refill on your cardiac medications before your next appointment, please call your pharmacy.

## 2023-09-12 NOTE — Progress Notes (Signed)
 Advanced Hypertension Clinic Initial Assessment:    Date:  09/24/2023   ID:  Dylan [name], DOB Jun 11, 1946, MRN [id number]  PCP:  Miguel Aschoff, MD  Cardiologist:  Reatha Harps, MD   Referring MD: Andrey Farmer, *   CC: Hypertension  History of Present Illness:    Dylan [name] is a 78 y.o. male with a hx of HCM, HFrEF, persistent atrial fibrillation, hyperaldosteronism,  CKD 3B, pulmonary hypertension, cirrhosis, CAD hyperlipidemia, here to establish care in the Advanced Hypertension Clinic.  He follows with Dr. Flora Lipps for cardiology.  At his last visit 06/2023 he reported that his blood pressures at home are well-controlled.  In the office it was 130/80.  He has a history of asymmetric septal hypertrophy consistent with HCM.  Cardiac MRI in 2022 revealed a 2.1 cm septum with patchy LGE.  Findings were consistent with HCM.  Systolic function was 30-35% on echo 07/2022.  He has severely reduced RV function and RVSP 61 mmHg.  Cath revealed obstructive disease in D2.  He was admitted 06/2023 with acute respiratory failure and acute on chronic heart failure.  At that time LV was hyperdynamic with LVEF 70-75% with severe LVH.  RVSP was 50.6 mmHg.  He had severe biatrial enlargement.  He was diuresed with IV Lasix.  Prior PYP scanning was negative in 2022.  He follow-up with his primary care doctor's 06/2023 and he was feeling well.  His shortness of breath had improved.  However his blood pressure was 160/82.  Hydralazine was increased.  He was encouraged to use a CPAP but declined.  They noted that his aldosterone/renin ratio was elevated in 2022 and had been started on some eplerenone.  There were concerns that he may have resistant hypertension and he was referred to the advanced hypertension clinic.  Discussed the use of AI scribe software for clinical note transcription with the patient, who gave verbal consent to proceed.  History of Present Illness   Dylan [name] is  accompanied by his granddaughter.   He has ongoing issues with high blood pressure, which he monitors daily at home. Despite medication adjustments, his blood pressure remains elevated, often around 160/90 mmHg in the mornings. He is currently taking hydralazine 50 mg three times a day, increased from 30 mg three times a day, but feels it is not as effective as before. He also takes eplerenone for hormone regulation related to aldosterone overproduction, but his blood pressure control remains suboptimal.  He is diabetic and was previously on insulin but is now managed with metformin. Blood sugar levels are reportedly stable. He experienced a foot injury after a podiatrist visit, leading to a sore and swollen toe, which has limited his ability to walk for exercise over the past two months. He has not returned to the podiatrist but has seen his primary doctor for this issue.  He lives alone and cooks his meals, avoiding fast food and limiting salt intake. He has reduced caffeine consumption, having not had coffee for about a month and a half. He denies using supplements or herbs and reports no significant pain requiring medication. He snores and his granddaughter notes occasional pauses in breathing during sleep. He feels sleepy after taking his medication, which he attributes to his current regimen.  He has a history of kidney issues, with a creatinine level that fluctuates but was stable at 1.58 mg/dL in November 4098. He was previously under the care of a kidney specialist but has not seen  one in over a year. He is concerned about the cost of his medications, including a blood thinner that has increased in price.  He has no edema, orthopnea or PND.        Previous antihypertensives:  Secondary Causes of Hypertension    Past Medical History:  Diagnosis Date   Anemia 10/18/2014   Baseline about 12 and stable from 2010 to 2016. Colon 2009 in [place] (records cannot be obtained).  EGD Dr Elnoria Howard 2011 nl     Aortic atherosclerosis ([name]) 03/28/2020   Incidental finding on imaging CT    Atherosclerosis 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 ([name]) 12/28/2014   ABI Feb 2017 R 0.49; L 0.72 with diffuse dz ABI Aug 2017 R 0.49; L 0.69 ABI Jan 2018 R 0.61; L 0.73  ABI Feb 2019 R 0.55; L 0.71 Sees Dr Imogene Burn - recs ABI q 6 months   Chronic diastolic 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 ([name]) 10/18/2014   Noted ECHO 10/2014. Grade 2. EF 50-55%; echo repeated 2019, severe hypertrophy with elevated filling pressures   Chronic kidney disease, stage 3b ([name]) 03/09/2020   Dr. Hyman Hopes  Nephrologist, f/u Q 17M   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) 10/18/2014   Noted ECHO 10/2014. Grade 2. EF 50-55%; echo repeated 2019, severe hypertrophy with elevated filling pressures  Repeat ECHO 08/04/21 showed EF 30-35%, severely elevated PASP, severely reduced RVSF, severely dilated LA and RA, small pericardial effusion     Coronary artery disease without angina pectoris    Degloving injury of finger 03/28/2020   10/21/19: copied from op note "1.  Open reduction percutaneous pinning left small finger proximal phalanx fracture 2.  Complex repair of laceration to left small finger 3 cm in length 3.  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 No significant callus formation is noted. Pins had been removed   Diverticulosis 10/08/2014   Seen on CT. Reportedly on Colon in [place] in 2009. Freq bouts of diverticulitis.   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 Stable from 2010 to 2016.  Negative SPEP and UPEP 2014 after ARF 2/2 continued ACE (lisinopril 20) use while vol contracted. 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 Failed CPAP on study. BiPA inspiratory pressure of 21 and expiratory pressure of 17 CWP. Consider ENT evaluation for potentially correctable upper airway obstruction contributing to the need for unusually high pressures - ENT July 2015 did not feel any intervention surgically was indicated. He wore a large F&P Simplus fullfac   Personal history of colonic polyps 03/28/2014   Dr. Elnoria Howard, 2 polyps removed 2015.  No polyps on f/u in 2020.  No further surveillance suggested per Dr. Elnoria Howard.   Refractory obstruction of nasal airway 04/27/2020   Chronic problem, evaluated by ENT in the past, with  recommendation for surgery which she has been hesitant to consider.  He is unable to breathe through his nose comfortably.  This is part of the reason why he does not wear his oxygen regularly.  Nasal passages are nearly completely obstructed erythematous smooth glistening surface.  He has been told by his eye doctor that part of the reason his e   Resistant hypertension 10/09/2014   Poor control with 6 drug therapy. 2016 : Aldo 37 but ARR 23.5    Severe pulmonary arterial systolic hypertension ([name]) 10/18/2014   Noted as severe ECHO 2016 and 2019. Likely 2/2 severe untreated OSA. Pt is not adherent to CPAP.   Stage 3b chronic kidney disease (CKD) ([name]) 03/09/2020   Dr. Hyman Hopes  Nephrologist, f/u Q 22M     Syncope 12/31/2020   Thrombocytopenia ([name]) 10/09/2016   Nl liver and spleen on [place] 2019   Ulcer aphthous oral 04/27/2020   Evaluated emergency room  last week, saw with Magic mouthwash   Unintentional weight loss due to Trulicity, resolved 11/28/2020    Past Surgical History:  Procedure Laterality Date   BIOPSY  10/10/2021   Procedure: BIOPSY;  Surgeon: Jeani Hawking, MD;  Location: [facility] ENDOSCOPY;  Service: Gastroenterology;;   CHOLECYSTECTOMY     COLONOSCOPY WITH PROPOFOL N/A 02/12/2019   Procedure: COLONOSCOPY WITH PROPOFOL;  Surgeon: Jeani Hawking, MD;  Location: [facility] ENDOSCOPY;  Service: Endoscopy;  Laterality: N/A;   ESOPHAGOGASTRODUODENOSCOPY (EGD) WITH PROPOFOL N/A 10/10/2021   Procedure: ESOPHAGOGASTRODUODENOSCOPY (EGD) WITH PROPOFOL;  Surgeon: Jeani Hawking, MD;  Location: [facility] ENDOSCOPY;  Service: Gastroenterology;  Laterality: N/A;   PERCUTANEOUS PINNING Left 10/21/2019   Procedure: CLOSED REDUCTION WITH PERCUTANEOUS PINNING AND SUTURE REPAIR OF LACERATION  OF SMALL FINGER,  SUTURE REPAIR OF LACERATION OF INDEX FINGER;  Surgeon: Allena Napoleon, MD;  Location: [facility] OR;  Service: Plastics;  Laterality: Left;   RIGHT/LEFT HEART CATH AND CORONARY ANGIOGRAPHY N/A 08/09/2021   Procedure: RIGHT/LEFT HEART CATH AND CORONARY ANGIOGRAPHY;  Surgeon: Lennette Bihari, MD;  Location: [facility] INVASIVE CV LAB;  Service: Cardiovascular;  Laterality: N/A;    Current Medications: Current Meds  Medication Sig   allopurinol (ZYLOPRIM) 100 MG tablet Take 1 tablet (100 mg total) by mouth daily.   amLODipine (NORVASC) 10 MG tablet Take 1 tablet (10 mg total) by mouth daily.   apixaban (ELIQUIS) 5 MG TABS tablet Take 1 tablet (5 mg total) by mouth 2 (two) times daily.   atorvastatin (LIPITOR) 40 MG tablet Take 1 tablet (40 mg total) by mouth daily.   doxazosin (CARDURA) 2 MG tablet Take 1 tablet (2 mg total) by mouth 2 (two) times daily.   eplerenone (INSPRA) 50 MG tablet Take 1 tablet (50 mg total) by mouth daily.   FREESTYLE LITE test strip 1 each by Other route in the morning.   furosemide (LASIX) 40 MG tablet Take 1 tablet (40 mg total) by mouth daily.    hydrALAZINE (APRESOLINE) 50 MG tablet Take 1.5 tablets (75 mg total) by mouth 3 (three) times daily.   latanoprost (XALATAN) 0.005 % ophthalmic solution Place 1 drop into both eyes at bedtime.   metFORMIN (GLUCOPHAGE) 500 MG tablet Take 1 tablet (500 mg total) by mouth 2 (two) times daily with a meal.     Allergies:   Ace inhibitors and Spironolactone   Social History   Socioeconomic History   Marital status: Divorced    Spouse name: Not on file   Number of children: 4   Years of  education: Not on file   Highest education level: 6th grade  Occupational History   Occupation: retired  Tobacco Use   Smoking status: Former    Current packs/day: 0.00    Average packs/day: 0.5 packs/day for 20.0 years (10.0 ttl pk-yrs)    Types: Cigarettes    Start date: 10/02/1952    Quit date: 10/02/1972    Years since quitting: 51.0   Smokeless tobacco: Never  Vaping Use   Vaping status: Never Used  Substance and Sexual Activity   Alcohol use: No    Alcohol/week: 0.0 standard drinks of alcohol   Drug use: No   Sexual activity: Never  Other Topics Concern   Not on file  Social History Narrative   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 Had yearly occupational testing inc PFT's. Now retired. Divorced. Has male sig other. Likes to go fishing.      He starting drinking as a teen and would drink to passing out. Couldn't keep job, have family. He quit drinking and smoking cold [place] with help of his faith. No ETOH since about age 28ish      Total of 7 brothers and 6 sisters.   Social Drivers of Corporate investment banker Strain: Low Risk  (06/20/2023)   Overall Financial Resource Strain (CARDIA)    Difficulty of Paying Living Expenses: Not very hard  Food Insecurity: No Food Insecurity (06/19/2023)   Hunger Vital Sign    Worried About Running Out of Food in the Last Year: Never true    Ran Out of Food in the Last Year: Never true  Transportation Needs: No Transportation Needs (06/20/2023)    PRAPARE - Administrator, Civil Service (Medical): No    Lack of Transportation (Non-Medical): No  Physical Activity: Insufficiently Active (09/12/2022)   Exercise Vital Sign    Days of Exercise per Week: 3 days    Minutes of Exercise per Session: 30 min  Stress: No Stress Concern Present (09/12/2022)   [employer] of Occupational Health - Occupational Stress Questionnaire    Feeling of Stress : Not at all  Social Connections: Unknown (09/12/2022)   Social Connection and Isolation Panel [NHANES]    Frequency of Communication with Friends and Family: Patient declined    Frequency of Social Gatherings with Friends and Family: Patient declined    Attends Religious Services: Patient declined    Database administrator or Organizations: No    Attends Engineer, structural: Patient declined    Marital Status: Divorced     Family History: The patient's family history includes CAD in his sister; Heart failure in his sister.  ROS:   Please see the history of present illness.     All other systems reviewed and are negative.  EKGs/Labs/Other Studies Reviewed:    EKG:  EKG is not ordered today.   RHC/HAC 06/2023:   Mid LAD lesion is 45% stenosed.   2nd Diag lesion is 85% stenosed.   Single-vessel coronary obstructive disease with 45% mid LAD stenosis in the region of the takeoff of a small second diagonal vessel which had 85% ostial/proximal stenosis.   Normal left circumflex and RCA.   Moderate pulmonary hypertension with mean PA at 36 mm.   PVR: 6.0 WU   RECOMMENDATION: Guideline directed medical therapy for the patient's reduced LV function and pulmonary hypertension.  The patient's LV dysfunction is out of proportion to his CAD; initiate medical therapy.  Echo 08/04/21: IMPRESSIONS  1. Left ventricular ejection fraction, by estimation, is 30 to 35%. The  left ventricle has moderately decreased function. The left ventricle has  no regional wall motion  abnormalities. Left ventricular diastolic  parameters are indeterminate. There is the   interventricular septum is flattened in systole and diastole, consistent  with right ventricular pressure and volume overload.   2. Right ventricular systolic function is severely reduced. The right  ventricular size is moderately enlarged. There is severely elevated  pulmonary artery systolic pressure. The estimated right ventricular  systolic pressure is 60.7 mmHg.   3. Left atrial size was severely dilated.   4. Right atrial size was severely dilated.   5. A small pericardial effusion is present. The pericardial effusion is  circumferential.   6. The mitral valve is normal in structure. Mild mitral valve  regurgitation. No evidence of mitral stenosis.   7. Tricuspid valve regurgitation is severe.   8. The aortic valve is normal in structure. There is moderate  calcification of the aortic valve. There is mild thickening of the aortic  valve. Aortic valve regurgitation is not visualized. Aortic valve  sclerosis/calcification is present, without any  evidence of aortic stenosis.   9. The inferior vena cava is dilated in size with <50% respiratory  variability, suggesting right atrial pressure of 15 mmHg.    Recent Labs: 06/20/2023: Hemoglobin 12.9; Platelets 120 07/04/2023: ALT 10; B Natriuretic Peptide 302.9; BUN 25; Creatinine, Ser 1.83; Potassium 3.6; Sodium 141   Recent Lipid Panel    Component Value Date/Time   CHOL 107 05/21/2023 1021   TRIG 104 05/21/2023 1021   HDL 51 05/21/2023 1021   CHOLHDL 2.1 05/21/2023 1021   CHOLHDL 4.6 01/12/2015 1032   VLDL 17 01/12/2015 1032   LDLCALC 37 05/21/2023 1021    Physical Exam:   VS:  BP (!) 162/78   Pulse 86   Ht 5\' 6"  (1.676 m)   Wt 159 lb 3.2 oz (72.2 kg)   SpO2 93%   BMI 25.70 kg/m  , BMI Body mass index is 25.7 kg/m. GENERAL:  Well appearing HEENT: Pupils equal round and reactive, fundi not visualized, oral mucosa  unremarkable NECK:  No jugular venous distention, waveform within normal limits, carotid upstroke brisk and symmetric, no bruits, no thyromegaly LUNGS:  Clear to auscultation bilaterally HEART:  RRR.  PMI not displaced or sustained,S1 and S2 within normal limits, no S3, no S4, no clicks, no rubs,  murmurs ABD:  Flat, positive bowel sounds normal in frequency in pitch, no bruits, no rebound, no guarding, no midline pulsatile mass, no hepatomegaly, no splenomegaly EXT:  2 plus pulses throughout, no edema, no cyanosis no clubbing SKIN:  No rashes no nodules NEURO:  Cranial nerves II through XII grossly intact, motor grossly intact throughout PSYCH:  Cognitively intact, oriented to person place and time   ASSESSMENT/PLAN:    # Hypertrophic Cardiomyopathy Hypertrophic cardiomyopathy with severe LVH and LVEF 70-75%.  Systolic function has improved from 30-35%.  Blood pressure control is crucial to reduce cardiac workload. - Optimize blood pressure management to prevent further cardiac thickening.  # HFpEF:  Heart failure with recent hospitalization for fluid overload. Marcelline Deist will be added to manage symptoms and reduce hospitalizations. - Initiate Farxiga pending cost assessment by pharmacy. - Coordinate with pharmacy to determine cost of Farxiga.  # Hypertension Hypertension poorly controlled with current regimen. Home readings consistently high. Will add doxazosin for blood pressure and urinary symptoms. - Initiate doxazosin 2 mg twice daily.  Continue amlodipine, hydralazine and eplerenone. - Provide a blood pressure tracking sheet. - Follow up in 1-2 months to assess blood pressure control and adjust medications as needed.  # Chronic Kidney Disease Stage 3 Chronic kidney disease stage 3 with fluctuating creatinine levels. Referral to nephrology necessary for comprehensive management. - Refer to nephrology for ongoing management and monitoring. - Order morning labs to check aldosterone,  renin, and thyroid levels.  # Diabetes Mellitus Diabetes mellitus managed with metformin. Blood sugar levels well-controlled. Marcelline Deist considered for additional benefits. - Continue metformin. - Consider Marcelline Deist for additional diabetes management benefits.  Follow-up Follow-up necessary to monitor blood pressure, kidney function, and overall health. Coordination with nephrology and pharmacy essential. - Follow up in 1-2 months for blood pressure assessment. - Ensure referral to nephrology is completed. - Coordinate with Child psychotherapist for transportation assistance to medical appointments.     Screening for Secondary Hypertension:     09/12/2023    4:30 PM  Causes  Drugs/Herbals Screened     - Comments Cooks at home.  Limits sodium intake. Limits caffeine.  No supplements.  No NSAIDS.  Sleep Apnea Screened     - Comments Snores.  Daytime somnolence.  Thyroid Disease Screened     - Comments repeat TSH    Relevant Labs/Studies:    Latest Ref Rng & Units 07/04/2023   12:26 PM 06/22/2023    7:48 AM 06/21/2023    1:25 PM  Basic Labs  Sodium 135 - 145 mmol/L 141  138  139   Potassium 3.5 - 5.1 mmol/L 3.6  3.4  3.5   Creatinine 0.61 - 1.24 mg/dL 1.61  0.96  0.45        Latest Ref Rng & Units 12/31/2020    2:50 PM 03/06/2017    9:05 AM  Thyroid   TSH 0.350 - 4.500 uIU/mL 0.677  0.800        Latest Ref Rng & Units 04/30/2021   11:01 AM 10/19/2014   10:41 AM  Renin/Aldosterone   Aldosterone 0.0 - 30.0 ng/dL 40.9  81.1   Renin 9.147 - 5.380 ng/mL/hr 0.210    Aldos/Renin Ratio 0.0 - 30.0 112.4               Disposition:    FU with MD/PharmD in 1 month    Medication Adjustments/Labs and Tests Ordered: Current medicines are reviewed at length with the patient today.  Concerns regarding medicines are outlined above.  Orders Placed This Encounter  Procedures   Aldosterone + renin activity w/ ratio   TSH   Ambulatory referral to Nephrology   Meds ordered this encounter   Medications   doxazosin (CARDURA) 2 MG tablet    Sig: Take 1 tablet (2 mg total) by mouth 2 (two) times daily.    Dispense:  180 tablet    Refill:  1     Signed, Chilton Si, MD  09/24/2023 5:15 PM    [facility]

## 2023-09-15 ENCOUNTER — Telehealth: Payer: Self-pay | Admitting: Cardiovascular Disease

## 2023-09-15 NOTE — Telephone Encounter (Addendum)
 Noted and Dr Duke Salvia aware

## 2023-09-15 NOTE — Telephone Encounter (Signed)
 Here ya go!

## 2023-09-15 NOTE — Telephone Encounter (Signed)
 Pt called in to inform Dr. Duke Salvia that he made an appt to see [facility] Thursday 09/18/23

## 2023-09-17 ENCOUNTER — Encounter (HOSPITAL_BASED_OUTPATIENT_CLINIC_OR_DEPARTMENT_OTHER): Payer: Self-pay

## 2023-09-19 LAB — LAB REPORT - SCANNED
Albumin, Urine POC: 149
Creatinine, POC: 225.7 mg/dL

## 2023-09-24 ENCOUNTER — Encounter (HOSPITAL_BASED_OUTPATIENT_CLINIC_OR_DEPARTMENT_OTHER): Payer: Self-pay | Admitting: Cardiovascular Disease

## 2023-10-09 ENCOUNTER — Encounter: Payer: Medicare HMO | Admitting: Internal Medicine

## 2023-10-09 NOTE — Progress Notes (Deleted)
 78 y.o. Dylan [name] is here for routine follow-up of ***.    Since my last visit with Dylan [name] on 06/26/2023 (he had recently been discharged from hospital for acute heart failure exacerbation and diuretic associated AKI), he has had heart failure clinic follow-up with cardiologist (primary cardiologist Dr. Bufford Buttner) who then referred Dylan [name] to cardiology hypertension specialist, respective visits on 07/04/2023 and 09/12/2023.  He was advised to make a follow-up appointment with his nephrologist which is scheduled for 09/18/2023.  Echo 06/2023 showed EF 70 to 75% with hyperdynamic LV, severe LVH, and severely enlarged RV with moderately reduced function.  At his cardiology appointment in 06/2023, he was noted to be doing well with Lasix 40 mg daily, and was to continue eplerenone 50 mg daily.  He is intolerant to ACEs and ARB's due to kidney disease.  It was advised to increase his endralazine to 75 mg 3 times daily and consider SGLT2 inhibitor next.  Right heart failure likely has a severe OSA component, though he is unable to tolerate any positive pressure apparatuses at all.  He was evaluated on 09/12/2023 by Dr. Duke Salvia in hypertension clinic.  Empagliflozin was to be initiated if not cost prohibitive.  Unfortunately doxazosin 2 mg twice daily was initiated and he was to follow-up in 1 to 2 months to reassess blood pressure control.  He was advised to follow-up with Pharm.D. for blood pressure monitoring on 10/20/2023.  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), nocturnal 08/03/2021   Subareolar gynecomastia in male due to spironolactone, resolved 04/09/2021   Secondary hyperparathyroidism of renal origin ([name]) 01/25/2021   RBBB 11/23/2020   History of GI diverticular bleed 11/21/2020   History of BPH 09/14/2020   Healthcare maintenance 09/13/2020   Urge urinary incontinence 06/29/2020   Refractory obstruction of nasal airway 04/27/2020   Aortic atherosclerosis ([name]) 03/28/2020   Stage 3b chronic kidney disease (CKD) ([name]) 03/09/2020   Coronary artery disease without angina pectoris 04/04/2018   Thrombocytopenia ([name]) 10/09/2016   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   Atherosclerosis 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 ([name]) 12/28/2014   Gout 10/19/2014   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 (Patient not taking: Reported on 09/12/2023), Disp: , Rfl:    allopurinol (ZYLOPRIM) 100 MG tablet, Take 1 tablet (100 mg total) by mouth daily., Disp: 90 tablet, Rfl: 3   amLODipine (NORVASC) 10 MG tablet, Take 1 tablet (10 mg total) by mouth daily., Disp: 90 tablet,  Rfl: 3   apixaban (ELIQUIS) 5 MG TABS tablet, Take 1 tablet (5 mg total) by mouth 2 (two) times daily., Disp: 60 tablet, Rfl: 11   atorvastatin (LIPITOR) 40 MG tablet, Take 1 tablet (40 mg  total) by mouth daily., Disp: 90 tablet, Rfl: 3   doxazosin (CARDURA) 2 MG tablet, Take 1 tablet (2 mg total) by mouth 2 (two) times daily., Disp: 180 tablet, Rfl: 1   eplerenone (INSPRA) 50 MG tablet, Take 1 tablet (50 mg total) by mouth daily., Disp: 90 tablet, Rfl: 3   FREESTYLE LITE test strip, 1 each by Other route in the morning., Disp: 100 each, Rfl: 3   furosemide (LASIX) 40 MG tablet, Take 1 tablet (40 mg total) by mouth daily., Disp: 90 tablet, Rfl: 3   hydrALAZINE (APRESOLINE) 50 MG tablet, Take 1.5 tablets (75 mg total) by mouth 3 (three) times daily., Disp: 135 tablet, Rfl: 3   ipratropium (ATROVENT) 0.03 % nasal spray, Place 2 sprays into both nostrils 3 (three) times daily as needed for rhinitis. (Patient not taking: Reported on 09/12/2023), Disp: , Rfl:    latanoprost (XALATAN) 0.005 % ophthalmic solution, Place 1 drop into both eyes at bedtime., Disp: , Rfl:    metFORMIN (GLUCOPHAGE) 500 MG tablet, Take 1 tablet (500 mg total) by mouth 2 (two) times daily with a meal., Disp: 180 tablet, Rfl: 3  Functional Status: Independent in ADLs, assisted with IADLs. Family provides transportation to appts. Intermittent use of assistive device (he has done much better in recent months). No falls. Seems to be having more difficulty with dates and events during today's visit. HE has very close family involvement and supervision. Usually accompanied on visits by either son or granddaughter.   Objective There were no vitals taken for this visit.  Exam: ***  Assessment and Plan: There are no diagnoses linked to this encounter.   No follow-ups on file.

## 2023-10-10 LAB — ALDOSTERONE + RENIN ACTIVITY W/ RATIO
Aldos/Renin Ratio: 17.7 (ref 0.0–30.0)
Aldosterone: 11.2 ng/dL (ref 0.0–30.0)
Renin Activity, Plasma: 0.632 ng/mL/h (ref 0.167–5.380)

## 2023-10-10 LAB — TSH: TSH: 0.857 u[IU]/mL (ref 0.450–4.500)

## 2023-10-13 ENCOUNTER — Telehealth (HOSPITAL_BASED_OUTPATIENT_CLINIC_OR_DEPARTMENT_OTHER): Payer: Self-pay

## 2023-10-13 NOTE — Telephone Encounter (Addendum)
 Call attempted, no answer, unable to leave message due to VM not being set up.   ----- Message from Chilton Si sent at 10/12/2023 11:55 AM EDT ----- Thyroid function is normal.  Labs for hyperaldosteronism are indeterminate.  Will discuss next steps for this at follow up.

## 2023-10-15 NOTE — Telephone Encounter (Signed)
2nd call attempt, Results called to patient who verbalizes understanding!

## 2023-10-20 ENCOUNTER — Encounter (HOSPITAL_BASED_OUTPATIENT_CLINIC_OR_DEPARTMENT_OTHER): Payer: Self-pay | Admitting: Pharmacist Clinician (PhC)/ Clinical Pharmacy Specialist

## 2023-10-20 ENCOUNTER — Ambulatory Visit (HOSPITAL_BASED_OUTPATIENT_CLINIC_OR_DEPARTMENT_OTHER): Admitting: Pharmacist Clinician (PhC)/ Clinical Pharmacy Specialist

## 2023-10-20 VITALS — BP 161/77 | HR 84

## 2023-10-20 DIAGNOSIS — I1A0 Resistant hypertension: Secondary | ICD-10-CM

## 2023-10-20 MED ORDER — HYDRALAZINE HCL 100 MG PO TABS: 100.0000 mg | ORAL_TABLET | Freq: Three times a day (TID) | ORAL | Status: DC

## 2023-10-20 NOTE — Progress Notes (Signed)
 Office Visit    Patient Name: Dylan [name] Date of Encounter: 10/20/2023  Primary Care Provider:  Miguel Aschoff, MD Primary Cardiologist:  Reatha Harps, MD  Chief Complaint    Hypertension - Advanced hypertension clinic  Past Medical History   hyperaldosteronism On eplerenone 50 qd  AF CHADS2VASc = 6  HFrEF 1/24 echo showed EF at 30-35%, improved to 70-75%  CKD Stage IIIb, has been seen by nephrology  DM2 10/24 A1c 5.4 -managed with metformin (was as high as 8.7)  CAD Obstructive D2 - managed medically  HLD 11/24 LDL 37 on atorvastatin  HCM Asymmetric septal hypertrophy    Allergies  Allergen Reactions   Ace Inhibitors Other (See Comments)    "ARF - see CRF overview"   Spironolactone Other (See Comments)    Gynecomastia per pt report    History of Present Illness    Dylan [name] is a 78 y.o. male patient who was referred to the Advanced Hypertension Clinic by Anna Genre MD.  He has been followed in cardiology by Dr. Flora Lipps for his HCM and recently was seen by Dr. Duke Salvia in the advanced hypertension clinic.  Labs done in 2022 showed an elevated aldosterone/renin level and he has been on eplerenone for awhile.  When she saw him she added doxazosin 2 mg bid.  Unfortunately he did not tolerate this (caused weakness, difficulty in getting around, lightheadedness) and stopped after about 2 weeks.  Notes that it took another 3-4 days before he felt better.    Blood Pressure Goal:  130/80  Current Medications: eplerenone 50 mg every day, hydralazine 75 mg tid, amlodipine 10 mg every day  (both in am)  Adherence Assessment  Do you ever forget to take your medication? [] Yes [x] No  Do you ever skip doses due to side effects? [] Yes [x] No  Do you have trouble affording your medicines? [x] Yes - Eliquis [] No  Are you ever unable to pick up your medication due to transportation difficulties? [] Yes [x] No   Previously tried:   doxazosin - weakness,  dizziness  Family Hx:   unsure of parent history - father maybe had bone cancer; mother had part of stomach removed, then died within a year - cancer?; son (60) on BP meds recently, daughter blind  Social Hx:      Tobacco: quit over 40 years ago  Alcohol:quit over 40 years ago  Caffeine: some coffee, no soda  Diet:  mostly home cooked meals, occasional salt with cooking; not much beef, more pork and chicken; vegetables - prefers fresh, collard, squash; doesn't snack much   Exercise: none  Home BP readings: takes up to 4 times in the morning, with anywhere from 10-30 point drop systolic from first to last readings.  Last 5 dates (final of 4 readings) were between 122-149/77-90      Accessory Clinical Findings    Lab Results  Component Value Date   CREATININE 1.83 (H) 07/04/2023   BUN 25 (H) 07/04/2023   NA 141 07/04/2023   K 3.6 07/04/2023   CL 110 07/04/2023   CO2 21 (L) 07/04/2023   Lab Results  Component Value Date   ALT 10 07/04/2023   AST 14 (L) 07/04/2023   ALKPHOS 45 07/04/2023   BILITOT 0.7 07/04/2023   Lab Results  Component Value Date   HGBA1C 5.4 05/01/2023    Screening for Secondary Hypertension:      09/12/2023    4:30 PM  Causes  Drugs/Herbals Screened     -  Comments Cooks at home.  Limits sodium intake. Limits caffeine.  No supplements.  No NSAIDS.  Sleep Apnea Screened     - Comments Snores.  Daytime somnolence.  Thyroid Disease Screened     - Comments repeat TSH    Relevant Labs/Studies:    Latest Ref Rng & Units 07/04/2023   12:26 PM 06/22/2023    7:48 AM 06/21/2023    1:25 PM  Basic Labs  Sodium 135 - 145 mmol/L 141  138  139   Potassium 3.5 - 5.1 mmol/L 3.6  3.4  3.5   Creatinine 0.61 - 1.24 mg/dL 1.61  0.96  0.45        Latest Ref Rng & Units 10/06/2023    9:28 AM 12/31/2020    2:50 PM  Thyroid   TSH 0.450 - 4.500 uIU/mL 0.857  0.677        Latest Ref Rng & Units 10/06/2023    9:28 AM 04/30/2021   11:01 AM 10/19/2014   10:41 AM   Renin/Aldosterone   Aldosterone 0.0 - 30.0 ng/dL 40.9  81.1  91.4   Renin 0.167 - 5.380 ng/mL/hr  0.210    Aldos/Renin Ratio 0.0 - 30.0 17.7  112.4                 Home Medications    Current Outpatient Medications  Medication Sig Dispense Refill   hydrALAZINE (APRESOLINE) 100 MG tablet Take 1 tablet (100 mg total) by mouth 3 (three) times daily. 270 tablet    allopurinol (ZYLOPRIM) 100 MG tablet Take 1 tablet (100 mg total) by mouth daily. 90 tablet 3   amLODipine (NORVASC) 10 MG tablet Take 1 tablet (10 mg total) by mouth daily. 90 tablet 3   apixaban (ELIQUIS) 5 MG TABS tablet Take 1 tablet (5 mg total) by mouth 2 (two) times daily. 60 tablet 11   atorvastatin (LIPITOR) 40 MG tablet Take 1 tablet (40 mg total) by mouth daily. 90 tablet 3   eplerenone (INSPRA) 50 MG tablet Take 1 tablet (50 mg total) by mouth daily. 90 tablet 3   FREESTYLE LITE test strip 1 each by Other route in the morning. 100 each 3   furosemide (LASIX) 40 MG tablet Take 1 tablet (40 mg total) by mouth daily. 90 tablet 3   latanoprost (XALATAN) 0.005 % ophthalmic solution Place 1 drop into both eyes at bedtime.     metFORMIN (GLUCOPHAGE) 500 MG tablet Take 1 tablet (500 mg total) by mouth 2 (two) times daily with a meal. 180 tablet 3   No current facility-administered medications for this visit.     Assessment & Plan   HYPERTENSION CONTROL Vitals:   10/20/23 1435 10/20/23 1440  BP: (!) 162/74 (!) 161/77    The patient's blood pressure is elevated above target today.  In order to address the patient's elevated BP: Blood pressure will be monitored at home to determine if medication changes need to be made.; A current anti-hypertensive medication was adjusted today.      Resistant hypertension (hyperaldosteronism) Assessment: BP is uncontrolled in office BP 161/77 mmHg;  above the goal (<130/80). Did not tolerate doxazosin Tolerates eplerenone, hydralazine, amlodipine well without any side  effects Denies SOB, palpitation, chest pain, headaches,or swelling Reiterated the importance of regular exercise and low salt diet   Plan:  Increase hydralazine to 100 mg three times daily Continue taking eplerenone 50 mg every day, amlodipine 10 mg every day  (both in am) Patient to keep record  of BP readings with heart rate and report to us  at the next visit Patient to follow up with APP in May  Labs ordered today:  none   Donivan Furry PharmD CPP [facility]  [address] [place], [place] [address] [phone]

## 2023-10-20 NOTE — Patient Instructions (Signed)
 Follow up appointment: WITH PA/NP IN MAY  Take your BP meds as follows:   INCREASE HYDRALAZINE TO 2 TABLETS (100 MG TOTAL) THREE TIMES DAILY  CONTINUE WITH EPLERENONE 50 MG ONCE DAILY AND AMLODIPINE 10 MG ONCE DAILY  Check your blood pressure at home daily (if able) and keep record of the readings.  Your blood pressure goal is <130/80  To check your pressure at home you will need to:  1. Sit up in a chair, with feet flat on the floor and back supported. Do not cross your ankles or legs. 2. Rest your left arm so that the cuff is about heart level. If the cuff goes on your upper arm,  then just relax the arm on the table, arm of the chair or your lap. If you have a wrist cuff, we  suggest relaxing your wrist against your chest (think of it as Pledging the Flag with the  wrong arm).  3. Place the cuff snugly around your arm, about 1 inch above the crook of your elbow. The  cords should be inside the groove of your elbow.  4. Sit quietly, with the cuff in place, for about 5 minutes. After that 5 minutes press the power  button to start a reading. 5. Do not talk or move while the reading is taking place.  6. Record your readings on a sheet of paper. Although most cuffs have a memory, it is often  easier to see a pattern developing when the numbers are all in front of you.  7. You can repeat the reading after 1-3 minutes if it is recommended  Make sure your bladder is empty and you have not had caffeine or tobacco within the last 30 min  Always bring your blood pressure log with you to your appointments. If you have not brought your monitor in to be double checked for accuracy, please bring it to your next appointment.  You can find a list of quality blood pressure cuffs at [email or web address]  Important lifestyle changes to control high blood pressure  Intervention  Effect on the BP  Lose extra pounds and watch your waistline Weight loss is one of the most effective lifestyle changes for  controlling blood pressure. If you're overweight or obese, losing even a small amount of weight can help reduce blood pressure. Blood pressure might go down by about 1 millimeter of mercury (mm Hg) with each kilogram (about 2.2 pounds) of weight lost.  Exercise regularly As a general goal, aim for at least 30 minutes of moderate physical activity every day. Regular physical activity can lower high blood pressure by about 5 to 8 mm Hg.  Eat a healthy diet Eating a diet rich in whole grains, fruits, vegetables, and low-fat dairy products and low in saturated fat and cholesterol. A healthy diet can lower high blood pressure by up to 11 mm Hg.  Reduce salt (sodium) in your diet Even a small reduction of sodium in the diet can improve heart health and reduce high blood pressure by about 5 to 6 mm Hg.  Limit alcohol One drink equals 12 ounces of beer, 5 ounces of wine, or 1.5 ounces of 80-proof liquor.  Limiting alcohol to less than one drink a day for women or two drinks a day for men can help lower blood pressure by about 4 mm Hg.   If you have any questions or concerns please use My Chart to send questions or call the office at [phone]

## 2023-10-20 NOTE — Assessment & Plan Note (Signed)
 Assessment: BP is uncontrolled in office BP 161/77 mmHg;  above the goal (<130/80). Did not tolerate doxazosin Tolerates eplerenone, hydralazine, amlodipine well without any side effects Denies SOB, palpitation, chest pain, headaches,or swelling Reiterated the importance of regular exercise and low salt diet   Plan:  Increase hydralazine to 100 mg three times daily Continue taking eplerenone 50 mg every day, amlodipine 10 mg every day  (both in am) Patient to keep record of BP readings with heart rate and report to us  at the next visit Patient to follow up with APP in May  Labs ordered today:  none

## 2023-11-10 ENCOUNTER — Other Ambulatory Visit (HOSPITAL_COMMUNITY): Payer: Self-pay | Admitting: Physician Assistant

## 2023-11-10 ENCOUNTER — Telehealth: Payer: Self-pay | Admitting: Cardiovascular Disease

## 2023-11-10 NOTE — Telephone Encounter (Signed)
 Patient is returning phone call.

## 2023-11-10 NOTE — Telephone Encounter (Signed)
 Spoke with patient and he states his hydralazine  was increased at last visit to 2 tablets TID.   OV notes from pharmD  states increase to 100 mg TID.  He needs a refill but he is requesting for 25 mg.  States he was not at 100 mg  Can you clarify  He will be out today and would like for it to go to [address]

## 2023-11-10 NOTE — Telephone Encounter (Signed)
 Left voicemail to return call to office

## 2023-11-10 NOTE — Telephone Encounter (Signed)
 Pt c/o medication issue:  1. Name of Medication:   hydrALAZINE  (APRESOLINE ) 100 MG tablet   2. How are you currently taking this medication (dosage and times per day)?   Patient taking 2 tablets, 3 times daily  3. Are you having a reaction (difficulty breathing--STAT)?   No  4. What is your medication issue?   Patient stated at his last visit that he should be taking 2 tablets, 3 times daily and he has not seen a difference in his BP readings.  Patient stated will be out of this medication tomorrow and wants a refill sent to [facility] [address], [place] - [address]

## 2023-11-11 ENCOUNTER — Other Ambulatory Visit: Payer: Self-pay | Admitting: Internal Medicine

## 2023-11-11 MED ORDER — HYDRALAZINE HCL 100 MG PO TABS: 100.0000 mg | ORAL_TABLET | Freq: Three times a day (TID) | ORAL | 3 refills | Status: DC

## 2023-11-11 NOTE — Telephone Encounter (Signed)
 Copied from CRM [id number]. Topic: Clinical - Medication Refill >> Nov 11, 2023  2:24 PM Brynn Caras wrote: Most Recent Primary Care Visit:  Provider: WILLIAMS, JULIE ANNE  Department: [facility]  Visit Type: OPEN ESTABLISHED  Date: 06/26/2023  Medication: hydrALAZINE  (APRESOLINE ) 100 MG tablet  Has the patient contacted their pharmacy? Yes (Agent: If no, request that the patient contact the pharmacy for the refill. If patient does not wish to contact the pharmacy document the reason why and proceed with request.) (Agent: If yes, when and what did the pharmacy advise?)  Is this the correct pharmacy for this prescription? Yes If no, delete pharmacy and type the correct one.  This is the patient's preferred pharmacy:  [facility] [address], [place] - [address] [place] [address] Phone: [phone] Fax: [phone]  [facility] [address] [place] [address] Phone: [phone] Fax: [phone]   Has the prescription been filled recently? No  Is the patient out of the medication? Yes  Has the patient been seen for an appointment in the last year OR does the patient have an upcoming appointment? Yes  Can we respond through MyChart? No  Agent: Please be advised that Rx refills may take up to 3 business days. We ask that you follow-up with your pharmacy.

## 2023-11-11 NOTE — Telephone Encounter (Signed)
 Hydralazine  was refilled 4/14 per Dr Rolm Clos but as "No Print".

## 2023-11-11 NOTE — Telephone Encounter (Signed)
 Rx is showing"no Print" ; can u try again to send it Normal or electronically. Thanks

## 2023-11-12 MED ORDER — HYDRALAZINE HCL 100 MG PO TABS: 100.0000 mg | ORAL_TABLET | Freq: Three times a day (TID) | ORAL | 3 refills | Status: AC

## 2023-11-12 NOTE — Addendum Note (Signed)
 Addended by: Daine Drummer on: 11/12/2023 09:59 AM   Modules accepted: Orders

## 2023-11-19 ENCOUNTER — Ambulatory Visit

## 2023-11-19 ENCOUNTER — Telehealth: Payer: Self-pay

## 2023-11-19 VITALS — Ht 66.0 in | Wt 158.0 lb

## 2023-11-19 DIAGNOSIS — Z0000 Encounter for general adult medical examination without abnormal findings: Secondary | ICD-10-CM

## 2023-11-19 NOTE — Patient Instructions (Signed)
 Dylan [name] , Thank you for taking time out of your busy schedule to complete your Annual Wellness Visit with me. I enjoyed our conversation and look forward to speaking with you again next year. I, as well as your care team,  appreciate your ongoing commitment to your health goals. Please review the following plan we discussed and let me know if I can assist you in the future. Your Game plan/ To Do List    Referrals: If you haven't heard from the office you've been referred to, please reach out to them at the phone provided.   Follow up Visits: Next Medicare AWV with our clinical staff: 11/24/2024 at 11:10 a.m. PHONE VISIT   Have you seen your provider in the last 6 months (3 months if uncontrolled diabetes)? Yes Next Office Visit with your provider: 12/18/2023 at 10:15 a.m OFFICE VISIT  Clinician Recommendations:  Aim for 30 minutes of exercise or brisk walking, 6-8 glasses of water, and 5 servings of fruits and vegetables each day.       This is a list of the screening recommended for you and due dates:  Health Maintenance  Topic Date Due   Zoster (Shingles) Vaccine (1 of 2) Never done   Eye exam for diabetics  06/25/2022   Complete foot exam   08/02/2022   COVID-19 Vaccine (5 - 2024-25 season) 05/10/2023   Yearly kidney health urinalysis for diabetes  06/21/2023   Hemoglobin A1C  08/01/2023   Flu Shot  02/06/2024   Lipid (cholesterol) test  05/20/2024   Yearly kidney function blood test for diabetes  07/03/2024   Medicare Annual Wellness Visit  11/18/2024   DTaP/Tdap/Td vaccine (3 - Td or Tdap) 10/20/2029   Pneumonia Vaccine  Completed   Hepatitis C Screening  Completed   HPV Vaccine  Aged Out   Meningitis B Vaccine  Aged Out   Colon Cancer Screening  Discontinued    Advanced directives: (Declined) Advance directive discussed with you today. Even though you declined this today, please call our office should you change your mind, and we can give you the proper paperwork for you to  fill out. Advance Care Planning is important because it:  [x]  Makes sure you receive the medical care that is consistent with your values, goals, and preferences  [x]  It provides guidance to your family and loved ones and reduces their decisional burden about whether or not they are making the right decisions based on your wishes.  Follow the link provided in your after visit summary or read over the paperwork we have mailed to you to help you started getting your Advance Directives in place. If you need assistance in completing these, please reach out to us  so that we can help you!  See attachments for Preventive Care and Fall Prevention Tips.

## 2023-11-19 NOTE — Telephone Encounter (Signed)
 I called pt-no answer, LVM to call the clinic back. Per Margette Sheldon, LPN. This patient is c/o bright yellow stool for the last 3 months. Can you schedule him to come in to be evaluated. he is scheduled to see Dr. Broadus Canes next month, but I dont know how to schedule for the providers.

## 2023-11-19 NOTE — Progress Notes (Signed)
 Because this visit was a virtual/telehealth visit,  certain criteria was not obtained, such a blood pressure, CBG if applicable, and timed get up and go. Any medications not marked as "taking" were not mentioned during the medication reconciliation part of the visit. Any vitals not documented were not able to be obtained due to this being a telehealth visit or patient was unable to self-report a recent blood pressure reading due to a lack of equipment at home via telehealth. Vitals that have been documented are verbally provided by the patient.   Subjective:   Dylan [name] is a 78 y.o. who presents for a Medicare Wellness preventive visit.  As a reminder, Annual Wellness Visits don't include a physical exam, and some assessments may be limited, especially if this visit is performed virtually. We may recommend an in-person visit if needed.  Visit Complete: Virtual I connected with  Dylan [name] on 11/19/23 by a audio enabled telemedicine application and verified that I am speaking with the correct person using two identifiers.  Patient Location: Home  Provider Location: Office/Clinic  I discussed the limitations of evaluation and management by telemedicine. The patient expressed understanding and agreed to proceed.  Vital Signs: Because this visit was a virtual/telehealth visit, some criteria may be missing or patient reported. Any vitals not documented were not able to be obtained and vitals that have been documented are patient reported.  VideoDeclined- This patient declined Librarian, academic. Therefore the visit was completed with audio only.  Persons Participating in Visit: Patient.  AWV Questionnaire: No: Patient Medicare AWV questionnaire was not completed prior to this visit.  Cardiac Risk Factors include: advanced age (>22men, >40 women);diabetes mellitus;dyslipidemia;family history of premature cardiovascular disease;hypertension;male  gender;sedentary lifestyle     Objective:     Today's Vitals   11/19/23 1041  Weight: 158 lb (71.7 kg)  Height: 5\' 6"  (1.676 m)  PainSc: 0-No pain   Body mass index is 25.5 kg/m.     11/19/2023   10:43 AM 06/19/2023    3:24 PM 06/19/2023    7:12 AM 05/01/2023   10:31 AM 12/19/2022   11:43 AM 09/12/2022    1:09 PM 09/12/2022   10:40 AM  Advanced Directives  Does Patient Have a Medical Advance Directive? No No No No No No No  Would patient like information on creating a medical advance directive? No - Patient declined No - Patient declined No - Patient declined No - Patient declined No - Patient declined No - Patient declined No - Patient declined    Current Medications (verified) Outpatient Encounter Medications as of 11/19/2023  Medication Sig   allopurinol  (ZYLOPRIM ) 100 MG tablet Take 1 tablet (100 mg total) by mouth daily.   amLODipine  (NORVASC ) 10 MG tablet Take 1 tablet (10 mg total) by mouth daily.   apixaban  (ELIQUIS ) 5 MG TABS tablet Take 1 tablet (5 mg total) by mouth 2 (two) times daily.   atorvastatin  (LIPITOR) 40 MG tablet Take 1 tablet (40 mg total) by mouth daily.   eplerenone  (INSPRA ) 50 MG tablet Take 1 tablet (50 mg total) by mouth daily.   FREESTYLE LITE test strip 1 each by Other route in the morning.   furosemide  (LASIX ) 40 MG tablet Take 1 tablet (40 mg total) by mouth daily.   hydrALAZINE  (APRESOLINE ) 100 MG tablet Take 1 tablet (100 mg total) by mouth 3 (three) times daily.   latanoprost  (XALATAN ) 0.005 % ophthalmic solution Place 1 drop into both eyes  at bedtime.   metFORMIN  (GLUCOPHAGE ) 500 MG tablet Take 1 tablet (500 mg total) by mouth 2 (two) times daily with a meal.   No facility-administered encounter medications on file as of 11/19/2023.    Allergies (verified) Ace inhibitors and Spironolactone    History: Past Medical History:  Diagnosis Date   Anemia 10/18/2014   Baseline about 12 and stable from 2010 to 2016. Colon 2009 in [place] (records  cannot be obtained).  EGD Dr Nickey Barn 2011 nl    Aortic atherosclerosis ([name]) 03/28/2020   Incidental finding on imaging CT    Atherosclerosis 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 ([name]) 12/28/2014   ABI Feb 2017 R 0.49; L 0.72 with diffuse dz ABI Aug 2017 R 0.49; L 0.69 ABI Jan 2018 R 0.61; L 0.73  ABI Feb 2019 R 0.55; L 0.71 Sees Dr Farrel Hones - recs ABI q 6 months   Chronic diastolic 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 ([name]) 10/18/2014   Noted ECHO 10/2014. Grade 2. EF 50-55%; echo repeated 2019, severe hypertrophy with elevated filling pressures   Chronic kidney disease, stage 3b ([name]) 03/09/2020   Dr. Arlis Lakes  Nephrologist, f/u Q 39M   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) 10/18/2014   Noted ECHO 10/2014. Grade 2. EF 50-55%; echo repeated 2019, severe hypertrophy with elevated filling pressures  Repeat ECHO 08/04/21 showed EF 30-35%, severely elevated PASP, severely reduced RVSF, severely dilated LA and RA, small pericardial effusion     Coronary artery disease without angina pectoris    Degloving injury of finger 03/28/2020   10/21/19: copied from op note "1.  Open reduction percutaneous pinning left small finger proximal phalanx fracture 2.  Complex repair of laceration to left small finger 3 cm in length 3.  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 No significant callus formation is noted. Pins had been removed   Diverticulosis 10/08/2014   Seen on CT. Reportedly on Colon in [place] in 2009. Freq bouts of diverticulitis.   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 Stable from 2010 to 2016.  Negative SPEP and UPEP 2014 after ARF 2/2 continued ACE (lisinopril 20) use while vol contracted. 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 Failed CPAP on study. BiPA inspiratory pressure of 21 and expiratory pressure of 17 CWP. Consider ENT evaluation for potentially correctable upper airway obstruction contributing to the need for unusually high pressures - ENT July 2015 did not feel any intervention surgically was indicated. He wore a large F&P Simplus fullfac   Personal history of colonic polyps 03/28/2014   Dr. Nickey Barn, 2 polyps removed 2015.  No polyps on f/u in 2020.  No further surveillance suggested per Dr. Nickey Barn.   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  He is unable to breathe through his nose comfortably.  This is part of the reason why he does not wear his oxygen regularly.  Nasal passages are nearly completely obstructed erythematous smooth glistening surface.  He has been told by his eye doctor that part of the reason his e   Resistant hypertension 10/09/2014   Poor control with 6 drug therapy. 2016 : Aldo 37 but ARR 23.5    Severe pulmonary arterial systolic hypertension ([name]) 10/18/2014   Noted as severe ECHO 2016 and 2019. Likely 2/2 severe untreated OSA. Pt is not adherent to CPAP.   Stage 3b chronic kidney disease (CKD) ([name]) 03/09/2020   Dr. Arlis Lakes  Nephrologist, f/u Q 24M     Syncope 12/31/2020   Thrombocytopenia ([name]) 10/09/2016   Nl liver and spleen on US  2019   Ulcer aphthous oral  04/27/2020   Evaluated emergency room last week, saw with Magic mouthwash   Unintentional weight loss due to Trulicity , resolved 11/28/2020   Past Surgical History:  Procedure Laterality Date   BIOPSY  10/10/2021   Procedure: BIOPSY;  Surgeon: Alvis Jourdain, MD;  Location: [facility] ENDOSCOPY;  Service: Gastroenterology;;   CHOLECYSTECTOMY     COLONOSCOPY WITH PROPOFOL  N/A 02/12/2019   Procedure: COLONOSCOPY WITH PROPOFOL ;  Surgeon: Alvis Jourdain, MD;  Location: [facility] ENDOSCOPY;  Service: Endoscopy;  Laterality: N/A;   ESOPHAGOGASTRODUODENOSCOPY (EGD) WITH PROPOFOL  N/A 10/10/2021   Procedure: ESOPHAGOGASTRODUODENOSCOPY (EGD) WITH PROPOFOL ;  Surgeon: Alvis Jourdain, MD;  Location[facility] ENDOSCOPY;  Service: Gastroenterology;  Laterality: N/A;   PERCUTANEOUS PINNING Left 10/21/2019   Procedure: CLOSED REDUCTION WITH PERCUTANEOUS PINNING AND SUTURE REPAIR OF LACERATION  OF SMALL FINGER,  SUTURE REPAIR OF LACERATION OF INDEX FINGER;  Surgeon: Barb Bonito, MD;  Location: [facility] OR;  Service: Plastics;  Laterality: Left;   RIGHT/LEFT HEART CATH AND CORONARY ANGIOGRAPHY N/A 08/09/2021   Procedure: RIGHT/LEFT HEART CATH AND CORONARY ANGIOGRAPHY;  Surgeon: Millicent Ally, MD;  Location: [facility] INVASIVE CV LAB;  Service: Cardiovascular;  Laterality: N/A;   Family History  Problem Relation Age of Onset   Heart failure Sister        Died of complications 06-13-2016  CAD Sister        CABG x3 while in her 46's   Social History   Socioeconomic History   Marital status: Divorced    Spouse name: Not on file   Number of children: 4   Years of education: Not on file   Highest education level: 6th grade  Occupational History   Occupation: retired  Tobacco Use   Smoking status: Former    Current packs/day: 0.00    Average packs/day: 0.5 packs/day for 20.0 years (10.0 ttl pk-yrs)    Types: Cigarettes    Start date: 10/02/1952    Quit date: 10/02/1972    Years since quitting: 51.1   Smokeless tobacco: Never  Vaping Use    Vaping status: Never Used  Substance and Sexual Activity   Alcohol use: No    Alcohol/week: 0.0 standard drinks of alcohol   Drug use: No   Sexual activity: Never  Other Topics Concern   Not on file  Social History Narrative   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 Had yearly occupational testing inc PFT's. Now retired. Divorced. Has male sig other. Likes to go fishing.      He starting drinking as a teen and would drink to passing out. Couldn't keep  job, have family. He quit drinking and smoking cold [place] with help of his faith. No ETOH since about age 38ish      Total of 7 brothers and 6 sisters.   Social Drivers of Corporate investment banker Strain: Low Risk  (11/19/2023)   Overall Financial Resource Strain (CARDIA)    Difficulty of Paying Living Expenses: Not hard at all  Food Insecurity: No Food Insecurity (11/19/2023)   Hunger Vital Sign    Worried About Running Out of Food in the Last Year: Never true    Ran Out of Food in the Last Year: Never true  Transportation Needs: No Transportation Needs (11/19/2023)   PRAPARE - Administrator, Civil Service (Medical): No    Lack of Transportation (Non-Medical): No  Physical Activity: Insufficiently Active (11/19/2023)   Exercise Vital Sign    Days of Exercise per Week: 3 days    Minutes of Exercise per Session: 30 min  Stress: No Stress Concern Present (11/19/2023)   [employer] of Occupational Health - Occupational Stress Questionnaire    Feeling of Stress : Not at all  Social Connections: Unknown (11/19/2023)   Social Connection and Isolation Panel [NHANES]    Frequency of Communication with Friends and Family: Patient declined    Frequency of Social Gatherings with Friends and Family: Patient declined    Attends Religious Services: Patient declined    Database administrator or Organizations: No    Attends Engineer, structural: Patient declined    Marital Status: Divorced    Tobacco  Counseling Counseling given: Not Answered    Clinical Intake:  Pre-visit preparation completed: Yes  Pain : No/denies pain Pain Score: 0-No pain     BMI - recorded: 25.5 Nutritional Status: BMI 25 -29 Overweight Nutritional Risks: None Diabetes: Yes CBG done?: Yes (111 FASTING) CBG resulted in Enter/ Edit results?: No Did pt. bring in CBG monitor from home?: No  Lab Results  Component Value Date   HGBA1C 5.4 05/01/2023   HGBA1C 5.7 (A) 12/19/2022   HGBA1C 6.1 (A) 09/12/2022     How often do you need to have someone help you when you read instructions, pamphlets, or other written materials from your doctor or pharmacy?: 1 - Never  Interpreter Needed?: No  Information entered by :: Gurney Balthazor N. Casimir Barcellos, LPN.   Activities of Daily Living     11/19/2023   10:46 AM 06/22/2023   12:42 PM  In your present state of health, do you have any difficulty performing the following activities:  Hearing? 1   Vision? 0   Difficulty concentrating or making decisions? 0   Walking or climbing stairs? 0   Dressing or bathing? 0   Doing errands, shopping? 0 0  Preparing Food and eating ? N   Using the Toilet? N   In the past six months, have you accidently leaked urine? Y   Do you have problems with loss of bowel control? Y   Comment BRIGHT YELLOW STOOL X 3 MONTHS   Managing your Medications? N   Managing your Finances? N   Housekeeping or managing your Housekeeping? N     Patient Care Team: Sherol Dixie, MD as PCP - General (Internal Medicine) O'Neal, Cathay Clonts, MD as PCP - Cardiology (Cardiology) Melodie Spry, MD as Consulting Physician (Nephrology) Ronni Colace, MD as Consulting Physician (Ophthalmology) Linard Reno, MD as Consulting Physician (Ophthalmology)  Indicate any recent Medical Services you may have received from  other than [facility] providers in the past year (date may be approximate).     Assessment:    This is a routine wellness examination for  Dylan [name].  Hearing/Vision screen Hearing Screening - Comments:: Patient has some hearing difficulties. No hearing aids. Vision Screening - Comments:: No rx glasses - up to date with routine eye exams with Dr. Ronni Colace and Dr. Linard Reno    Goals Addressed             This Visit's Progress    11/19/2023: Stay independent.         Depression Screen     11/19/2023   10:57 AM 05/01/2023   10:32 AM 12/19/2022   11:42 AM 09/12/2022    1:09 PM 09/12/2022   10:41 AM 06/20/2022   11:34 AM 06/20/2022   10:36 AM  PHQ 2/9 Scores  PHQ - 2 Score 0 0 0 0 0 0 0  PHQ- 9 Score 0          Fall Risk     11/19/2023   10:45 AM 05/01/2023   10:36 AM 12/19/2022   11:42 AM 09/12/2022    1:09 PM 09/12/2022   10:37 AM  Fall Risk   Falls in the past year? 0 0 0 0 0  Number falls in past yr: 0 0 0 0 0  Injury with Fall? 0 0 0 1 1  Risk for fall due to : No Fall Risks      Follow up Falls prevention discussed;Falls evaluation completed Falls evaluation completed Falls evaluation completed Falls evaluation completed Falls evaluation completed    MEDICARE RISK AT HOME:  Medicare Risk at Home Any stairs in or around the home?: Yes If so, are there any without handrails?: No Home free of loose throw rugs in walkways, pet beds, electrical cords, etc?: Yes Adequate lighting in your home to reduce risk of falls?: Yes Life alert?: No Use of a cane, walker or w/c?: No Grab bars in the bathroom?: No Shower chair or bench in shower?: Yes Elevated toilet seat or a handicapped toilet?: No  TIMED UP AND GO:  Was the test performed?  No  Cognitive Function: 6CIT completed    11/19/2023   10:46 AM  MMSE - Mini Mental State Exam  Not completed: Unable to complete        11/19/2023   10:46 AM 09/12/2022    1:10 PM  6CIT Screen  What Year? 0 points 0 points  What month? 0 points 0 points  What time? 0 points 0 points  Count back from 20 0 points 0 points  Months in reverse 0 points 0 points   Repeat phrase 0 points 0 points  Total Score 0 points 0 points    Immunizations Immunization History  Administered Date(s) Administered   Fluad Quad(high Dose 65+) 03/15/2021, 03/21/2022   Influenza,inj,Quad PF,6+ Mos 04/20/2015, 03/07/2016, 03/06/2017, 06/18/2018, 05/06/2019, 03/09/2020   PFIZER(Purple Top)SARS-COV-2 Vaccination 06/21/2020, 03/15/2023   Pneumococcal Conjugate-13 04/20/2015   Pneumococcal Polysaccharide-23 07/11/2016   Respiratory Syncytial Virus Vaccine,Recomb Aduvanted(Arexvy) 09/21/2022   Tdap 01/12/2015, 10/21/2019    Screening Tests Health Maintenance  Topic Date Due   Zoster Vaccines- Shingrix (1 of 2) Never done   OPHTHALMOLOGY EXAM  06/25/2022   FOOT EXAM  08/02/2022   COVID-19 Vaccine (5 - 2024-25 season) 05/10/2023   Diabetic kidney evaluation - Urine ACR  06/21/2023   HEMOGLOBIN A1C  08/01/2023   INFLUENZA VACCINE  02/06/2024   LIPID PANEL  05/20/2024  Diabetic kidney evaluation - eGFR measurement  07/03/2024   Medicare Annual Wellness (AWV)  11/18/2024   DTaP/Tdap/Td (3 - Td or Tdap) 10/20/2029   Pneumonia Vaccine 32+ Years old  Completed   Hepatitis C Screening  Completed   HPV VACCINES  Aged Out   Meningococcal B Vaccine  Aged Out   Colonoscopy  Discontinued    Health Maintenance  Health Maintenance Due  Topic Date Due   Zoster Vaccines- Shingrix (1 of 2) Never done   OPHTHALMOLOGY EXAM  06/25/2022   FOOT EXAM  08/02/2022   COVID-19 Vaccine (5 - 2024-25 season) 05/10/2023   Diabetic kidney evaluation - Urine ACR  06/21/2023   HEMOGLOBIN A1C  08/01/2023   Health Maintenance Items Addressed: Yes Patient aware of current care gaps.  Additional Screening:  Vision Screening: Recommended annual ophthalmology exams for early detection of glaucoma and other disorders of the eye.  Dental Screening: Recommended annual dental exams for proper oral hygiene  Community Resource Referral / Chronic Care Management: CRR required this visit?   No   CCM required this visit?  No   Plan:    I have personally reviewed and noted the following in the patient's chart:   Medical and social history Use of alcohol, tobacco or illicit drugs  Current medications and supplements including opioid prescriptions. Patient is not currently taking opioid prescriptions. Functional ability and status Nutritional status Physical activity Advanced directives List of other physicians Hospitalizations, surgeries, and ER visits in previous 12 months Vitals Screenings to include cognitive, depression, and falls Referrals and appointments  In addition, I have reviewed and discussed with patient certain preventive protocols, quality metrics, and best practice recommendations. A written personalized care plan for preventive services as well as general preventive health recommendations were provided to patient.   Margette Sheldon, LPN   1/61/0960   After Visit Summary: (Declined) Due to this being a telephonic visit, with patients personalized plan was offered to patient but patient Declined AVS at this time   Nurse Notes: Patient aware of current care gaps.  Immunization record was verified by [facility] and updated in patient's chart.

## 2023-11-20 NOTE — Progress Notes (Signed)
 Internal Medicine Attending:  I reviewed the AWV findings of the medical professional who conducted the visit. I was present in the office suite and immediately available to provide assistance and direction throughout the time the service was provided.

## 2023-11-24 ENCOUNTER — Other Ambulatory Visit: Payer: Self-pay

## 2023-11-24 DIAGNOSIS — E1122 Type 2 diabetes mellitus with diabetic chronic kidney disease: Secondary | ICD-10-CM

## 2023-11-24 MED ORDER — AMLODIPINE BESYLATE 10 MG PO TABS: 10.0000 mg | ORAL_TABLET | Freq: Every day | ORAL | 3 refills | Status: AC

## 2023-11-24 NOTE — Telephone Encounter (Signed)
 Medication sent to pharmacy

## 2023-12-04 ENCOUNTER — Ambulatory Visit: Attending: Physician Assistant | Admitting: Cardiology

## 2023-12-04 ENCOUNTER — Encounter: Payer: Self-pay | Admitting: Physician Assistant

## 2023-12-04 VITALS — BP 152/82 | HR 85 | Ht 66.0 in | Wt 153.0 lb

## 2023-12-04 DIAGNOSIS — I2729 Other secondary pulmonary hypertension: Secondary | ICD-10-CM

## 2023-12-04 DIAGNOSIS — I4811 Longstanding persistent atrial fibrillation: Secondary | ICD-10-CM

## 2023-12-04 DIAGNOSIS — I422 Other hypertrophic cardiomyopathy: Secondary | ICD-10-CM | POA: Diagnosis not present

## 2023-12-04 DIAGNOSIS — G4733 Obstructive sleep apnea (adult) (pediatric): Secondary | ICD-10-CM

## 2023-12-04 DIAGNOSIS — I1A0 Resistant hypertension: Secondary | ICD-10-CM

## 2023-12-04 DIAGNOSIS — I2510 Atherosclerotic heart disease of native coronary artery without angina pectoris: Secondary | ICD-10-CM | POA: Diagnosis not present

## 2023-12-04 DIAGNOSIS — G478 Other sleep disorders: Secondary | ICD-10-CM

## 2023-12-04 DIAGNOSIS — N1832 Chronic kidney disease, stage 3b: Secondary | ICD-10-CM

## 2023-12-04 MED ORDER — ISOSORBIDE MONONITRATE ER 30 MG PO TB24: 30.0000 mg | ORAL_TABLET | Freq: Every day | ORAL | 3 refills | Status: DC

## 2023-12-04 MED ORDER — DAPAGLIFLOZIN PROPANEDIOL 10 MG PO TABS: 10.0000 mg | ORAL_TABLET | Freq: Every day | ORAL | 6 refills | Status: DC

## 2023-12-04 NOTE — Patient Instructions (Signed)
 Medication Instructions:  START IMDUR  30 MG DAILY *If you need a refill on your cardiac medications before your next appointment, please call your pharmacy*  Lab Work: NO LABS If you have labs (blood work) drawn today and your tests are completely normal, you will receive your results only by: MyChart Message (if you have MyChart) OR A paper copy in the mail If you have any lab test that is abnormal or we need to change your treatment, we will call you to review the results.  Testing/Procedures:[address] Your physician has requested that you have a renal artery duplex. During this test, an ultrasound is used to evaluate blood flow to the kidneys. Allow one hour for this exam. Do not eat after midnight the day before and avoid carbonated beverages. Take your medications as you usually do.   Follow-Up: At [facility], you and your health needs are our priority.  As part of our continuing mission to provide you with exceptional heart care, our providers are all part of one team.  This team includes your primary Cardiologist (physician) and Advanced Practice Providers or APPs (Physician Assistants and Nurse Practitioners) who all work together to provide you with the care you need, when you need it.  Your next appointment:   1-2 month(s)  Provider:   Neomi Banks, NP or Maudine Sos, MD   Other Instructions You have been referred to Pulmonology  Needs appointment with Nephrology

## 2023-12-04 NOTE — Progress Notes (Unsigned)
 Cardiology Office Note:  .   Date:  12/04/2023  ID:  Dylan [name], DOB 16-Apr-1946, MRN [id number] PCP: Sherol Dixie, MD  [facility] Providers Cardiologist:  Oneil Bigness, MD {  History of Present Illness: .   Dylan [name] is a 78 y.o. 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     HCM PYP scan 03/2021 negative Cardiac MRI 03/2021, 2.1 cm septum with patchy LGE consistent with HCM.   Echocardiogram 07/2021 EF 30 to 35%, severely reduced RV, RVSP 61 Cardiac catheterization 08/2021 single-vessel disease 45% stenosis mid LAD, 85% stenosis D2.  Moderate pulmonary hypertension, mean PA 36, PVR 6.  EF out of proportion to CAD Admitted CHF exacerbation.  Echocardiogram 06/2023, hyperdynamic EF 70 to 75%, moderately reduced RV.  RVSP 50.6 no significant valvular disease.  Resistant hypertension Felt secondary to hyperaldosteronism, poorly controlled.  Followed by Dr. Theodis Fiscal. Doxazosin  initiated 09/2023, this was later stopped due to side effects Hydralazine  increased from 75 mg 3 times daily to 100 mg 3 times daily 10/2023 09/2023 aldosterone/renin levels within normal limits/indeterminant, TSH normal.  Results were abnormal 2 years ago.      Patient primarily being seen for history of HCM with no LVOT.  He has history of hyper aldosteronism with uncontrolled blood pressure and followed by Dr. Theodis Fiscal for resistant hypertension.  Last seen March 2025, doxazosin  was added for hypertension, Farxiga  attempted to be added for HFpEF pending approval, pharmacy increased hydralazine  to 100 mg 3 times daily.  Patient presents for follow-up of blood pressure.   He did not tolerate the doxazosin  and almost immediately started to feel orthostatic, lightheaded, dizzy.  Arnetta Lank it made him feel sick.  He stopped this and then within 4 days he was back to feeling normal.  Blood pressure still around 100 5260 systolic.  Reports compliancy with all of his medications.  Having no issues of shortness of breath, peripheral edema, chest pain.  Reports no recent syncope.  Has not had issues with volume.  ROS: Denies: Chest pain, shortness of breath, orthopnea, peripheral edema, palpitations, decreased exercise intolerance, fatigue, lightheadedness.   Studies Reviewed: Dylan [name]    EKG Interpretation Date/Time:  Thursday Dec 04 2023 13:33:54 EDT Ventricular Rate:  81 PR Interval:    QRS Duration:  144 QT Interval:  414 QTC Calculation: 480 R Axis:   43  Text Interpretation: Atrial fibrillation Right bundle branch block T wave abnormality, consider inferolateral ischemia When compared with ECG of 04-Jul-2023 11:13, No significant change since last tracing Confirmed by Dylan [name] [phone]) on 12/04/2023 1:39:38 PM    Risk Assessment/Calculations:       Physical Exam:   VS:  BP (!) 152/82 (BP Location: Left Arm, Patient Position: Sitting, Cuff Size: Normal)   Pulse 85   Ht 5\' 6"  (1.676 m)   Wt 153 lb (69.4 kg)   SpO2 94%   BMI 24.69 kg/m    Wt Readings from Last 3 Encounters:  12/04/23 153 lb (69.4 kg)  11/19/23 158 lb (71.7 kg)  09/12/23 159 lb 3.2 oz (72.2 kg)    GEN: Well nourished, well developed in no acute distress NECK: No JVD; No carotid bruits CARDIAC: RRR, no murmurs, rubs, gallops RESPIRATORY:  Clear to auscultation without rales, wheezing or rhonchi  ABDOMEN: Soft,  non-tender, non-distended EXTREMITIES:  No edema; No deformity   ASSESSMENT AND PLAN: .    HCM Heart failure with recovered EF -PYP scan 03/2021 negative, cardiac MRI 03/2021 confirming HCM -Echocardiogram 07/2021 EF 30 to 35%, severely reduced RV, RVSP 61 -Cardiac catheterization  08/2021 single-vessel disease pulmonary hypertension, mean PA 36, PVR 6.  EF out of proportion to CAD -Echocardiogram 06/2023, hyperdynamic EF 70 to 75%, moderately reduced RV.  RVSP 50.6 no significant valvular disease.  Overall seems to be decently compensated and without any issues with volume or syncope.  We are attempting to manage BP as best as we can to prevent further disease progression although difficult. No beta-blocker due to nocturnal CHB Discussed with pharmacy, we are able to get him a grant where Elvina Hammers is free for him from next year.  He just needs to bring form to pharmacy.  We have called and he will pick this up.  Sending prescription now after the visit, check BMP 1 week.  Persistent atrial fibrillation Rate controlled heart rates in the 80s, overall favoring rate control strategy given multiple comorbid conditions per primary MD. Continue with Eliquis  5 mg twice daily No beta-blocker due to nocturnal CHB.  Nonobstructive CAD Hyperlipidemia Cardiac catheterization 08/2021 single-vessel disease 45% stenosis mid LAD, 85% stenosis D2.  Moderate pulmonary hypertension, mean PA 36, PVR 6.  EF out of proportion to CAD, medical management.  No anginal complaints. No aspirin  on Eliquis  LDL 37 6 months ago, does not appear to be on statin.  Resistant hypertension Felt secondary to hyperaldosteronism, followed by advanced HTN clinic.  BP remains to be uncontrolled.  Did not tolerate doxazosin  due to orthostatic issues and feelings of lightheadedness.  Hydralazine  recently increased to 100 mg 3 times daily with no significant improvement.    Blood pressure seems to fluctuate around 150-160+. Continue with amlodipine  10 mg, eplerenone  50 mg, hydralazine  100 mg 3 times daily, will add Imdur  30 mg.  He has no LVOT so this should be reasonably safe to trial.  Question if at this point we need to start considering minoxidil  or other nontraditional therapies Felt related to  hyperaldosteronism, most recent renin/aldosterone labs show normal values although may be interpreted as indeterminate.  I wonder if we need to start evaluating secondary etiologies.  Although rare and unlikely pheochromocytoma and other pathologies may need to be ruled out Will order duplex renal ultrasounds to rule out renal artery stenosis to begin. Reports good compliancy with meds.   Pulmonary hypertension This is likely group 3 in the setting of severe OSA and not on CPAP.  This likely also is a significant driver of his blood pressure.  Discussed with him consideration of inspire and he would be amenable. Sending referral to pulmonology to see for consideration of inspire and if they have any other recommendations and management of his pulmonary hypertension/OSA.  Hoping with treatment of this his BP will improve  Diabetes Well-controlled A1c 7.4% 7 months ago  Dispo: Follow-up with Dr. Theodis Fiscal or Neomi Banks in the next 1 to 3 months    Signed, Burnetta Cart, PA-C

## 2023-12-05 ENCOUNTER — Telehealth: Payer: Self-pay

## 2023-12-05 NOTE — Telephone Encounter (Addendum)
 Called patient in regards to patient being approved for grant for farxiga and will need to pick up approval letter to take to pharmacy also patient will need to have bmet in 1 week after starting farxiga, patient verbalized understanding will come pick up letter next Friday.

## 2023-12-11 ENCOUNTER — Telehealth (HOSPITAL_BASED_OUTPATIENT_CLINIC_OR_DEPARTMENT_OTHER): Payer: Self-pay

## 2023-12-11 NOTE — Telephone Encounter (Addendum)
 Called pt and rescheduled sooner for 6/26 at 245pm with [facility] walker  ----- Message from [facility] sent at 12/09/2023  5:43 PM EDT ----- Yes, that is good.  Thanks.  ~Tiffany ----- Message ----- From: Guss Legacy, RN Sent: 12/09/2023   5:02 PM EDT To: Maudine Sos, MD  Is 6/26 with Otho Blitz okay? ----- Message ----- From: Maudine Sos, MD Sent: 12/09/2023   1:37 PM EDT To: Harrold Lincoln, MD; Burnetta Cart, PA-C; #  Eldonna Greenspan, can we please get him [facility] f/u?  ~Tiffany ----- Message ----- From: Harrold Lincoln, MD Sent: 12/06/2023   4:28 PM EDT To: Maudine Sos, MD; Burnetta Cart, PA-C  Maybe we can get him into see Dr. Theodis Fiscal to see if she can offer assistance.   -W ----- Message ----- From: Melida Sprain Sent: 12/05/2023   5:12 PM EDT To: Maudine Sos, MD; Harrold Lincoln, MD  HCM patient with resistant HTN.  Did not tolerate the doxazosin .  Increasing hydralazine  having little to no effect.  I added Imdur  30 mg.  Question if we should be looking for other secondary etiologies.  HTN thought related to hyperaldosteronism but recent renin/aldosterone looks to be normal limits although not sure if this directly corresponds with BP.  Also ordered duplex renal ultrasounds just to be thorough.

## 2023-12-17 ENCOUNTER — Telehealth: Payer: Self-pay | Admitting: Cardiovascular Disease

## 2023-12-17 NOTE — Telephone Encounter (Signed)
 Pt is having a Renal Ultrasound tomorrow at 1:30. He wants to know about taking his metformin  and eating with it. Please advice.

## 2023-12-17 NOTE — Telephone Encounter (Signed)
 Attempted to call pt, unable to reach. Unable to LVM.

## 2023-12-18 ENCOUNTER — Ambulatory Visit (HOSPITAL_COMMUNITY)
Admission: RE | Admit: 2023-12-18 | Discharge: 2023-12-18 | Disposition: A | Discharge status: Home / Self Care | Source: Ambulatory Visit | Attending: Cardiology | Admitting: Cardiology

## 2023-12-18 ENCOUNTER — Ambulatory Visit: Payer: Self-pay | Admitting: Internal Medicine

## 2023-12-18 VITALS — BP 150/78 | HR 83 | Temp 98.1°F | Ht 66.0 in | Wt 150.8 lb

## 2023-12-18 DIAGNOSIS — I5022 Chronic systolic (congestive) heart failure: Secondary | ICD-10-CM | POA: Diagnosis not present

## 2023-12-18 DIAGNOSIS — G478 Other sleep disorders: Secondary | ICD-10-CM | POA: Diagnosis not present

## 2023-12-18 DIAGNOSIS — E1122 Type 2 diabetes mellitus with diabetic chronic kidney disease: Secondary | ICD-10-CM | POA: Diagnosis not present

## 2023-12-18 DIAGNOSIS — R197 Diarrhea, unspecified: Secondary | ICD-10-CM

## 2023-12-18 DIAGNOSIS — N2581 Secondary hyperparathyroidism of renal origin: Secondary | ICD-10-CM

## 2023-12-18 DIAGNOSIS — I2729 Other secondary pulmonary hypertension: Secondary | ICD-10-CM | POA: Diagnosis not present

## 2023-12-18 DIAGNOSIS — Z7984 Long term (current) use of oral hypoglycemic drugs: Secondary | ICD-10-CM

## 2023-12-18 DIAGNOSIS — K7460 Unspecified cirrhosis of liver: Secondary | ICD-10-CM

## 2023-12-18 DIAGNOSIS — R634 Abnormal weight loss: Secondary | ICD-10-CM

## 2023-12-18 DIAGNOSIS — G4733 Obstructive sleep apnea (adult) (pediatric): Secondary | ICD-10-CM

## 2023-12-18 DIAGNOSIS — N1832 Chronic kidney disease, stage 3b: Secondary | ICD-10-CM | POA: Diagnosis not present

## 2023-12-18 DIAGNOSIS — I1A0 Resistant hypertension: Secondary | ICD-10-CM

## 2023-12-18 DIAGNOSIS — R1032 Left lower quadrant pain: Secondary | ICD-10-CM

## 2023-12-18 DIAGNOSIS — I421 Obstructive hypertrophic cardiomyopathy: Secondary | ICD-10-CM

## 2023-12-18 DIAGNOSIS — L719 Rosacea, unspecified: Secondary | ICD-10-CM

## 2023-12-18 DIAGNOSIS — K7030 Alcoholic cirrhosis of liver without ascites: Secondary | ICD-10-CM

## 2023-12-18 LAB — POCT GLYCOSYLATED HEMOGLOBIN (HGB A1C): Hemoglobin A1C: 5.4 % (ref 4.0–5.6)

## 2023-12-18 LAB — GLUCOSE, CAPILLARY: Glucose-Capillary: 111 mg/dL — ABNORMAL HIGH (ref 70–99)

## 2023-12-18 NOTE — Telephone Encounter (Signed)
 Spoke with pt, he has no questions at this time.

## 2023-12-18 NOTE — Patient Instructions (Addendum)
 Hi Dylan [name],  Great to catch up with you today!  We discussed the following:   Blood pressure medicines - I agree with stopping the doxazosin  and the Imdur /nitrate.  I would like for you to take the Farxiga , it is good for hear, kidneys, and diabetes.  2.   Yellow diarrhea and weight loss, abdominal pain:  I am checking some blood work and a stool sample test. I will be ordering a CT scan of your abdomen.  We need to get to the bottom of this.  3.   I would recommend that you go back to a podiatrist to get your nails trimmed so that they don't press against the end of your shoe.  You can request a different doctor.   I want to get us  back on schedule again to keep up with your symptoms!  Let's try for 1-2 months from now to follow up on the diarrhea and weight.  Call if you have questions!  Dr. Broadus Canes

## 2023-12-18 NOTE — Progress Notes (Signed)
 78 y.o. Dylan [name] is here for routine follow-up of chronic concerns listed below, primarily his HTN.    Since our last visit together he was referred by his cardiologist to HTN clinic at [facility], started on bid terazosin which he didn't tolerate (dizziness, weakness, felt he would fall, didn't have strength to do anything).  He stopped and side effects  resolved in a couple of days (we are throwing it away today; contraindicated in older adults).  His hydralazine  dose has been increased and he was started on Imdur  and on Farxiga .  He feels that the Imdur  and Farxiga  have actually increased his bp and he stopped them until we could talk today.  He is currently not feeling dizziness, fear of falling, chest tightness or pain, or difficulty breathing.  He is not having LE edema.  He actually has an appointment this afternoon for some tests ordered by cardiology. There has been a lot of thought given to his resistant HTN and desire to further explore secondary causes; management has been tricky in setting of his CKD and his severe pulmonary HTN and hypertrophic CM.  He also had an appt with [facility] a few months ago; urine but not blood tests done.  Has f/u appt in August with his new nephro (Dr. Arlis Lakes has left the practice).  His greater concern at this time is development of loose and watery very yellow stool, and upper abdominal pain like if you were punched in the gut.  Has had a bad taste in his mouth, like metfomin, it has a bad bitter taste - may be reflux, though it doesn't burn.  A friend told him that this could be a sign that metfomin is bad for him we've read about it and metformin  is a bad medicine, I don't want to take it.  Has been losing weight despite a good appetite and intake I don't want to lose weight.  Urine smells strong, no dysuria.  He has been drinking water adequately.   Reports problems with small abscesses on his nose, tender.  Washes face with water  alone, no soap.   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), nocturnal 08/03/2021   Subareolar gynecomastia in male due to spironolactone , resolved 04/09/2021   Secondary hyperparathyroidism of renal origin ([name]) 01/25/2021   RBBB 11/23/2020   History of GI diverticular bleed 11/21/2020   History of BPH 09/14/2020   Healthcare maintenance 09/13/2020   Urge urinary incontinence 06/29/2020   Refractory obstruction of nasal airway 04/27/2020   Aortic atherosclerosis ([name]) 03/28/2020   Stage 3b chronic kidney disease (CKD) ([name]) 03/09/2020   Coronary artery disease without angina pectoris 04/04/2018   Thrombocytopenia ([name]) 10/09/2016   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   Atherosclerosis 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 ([name]) 12/28/2014   Gout 10/19/2014   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) 10/18/2014   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) 10/18/2014   Resistant hypertension (hyperaldosteronism) 10/09/2014   OSA (obstructive  sleep apnea) 10/08/2014   Former tobacco use 10/08/2014   History of colonic polyps 03/28/2014   DM (diabetes mellitus), type 2 with renal complications ([name]) 10/08/1993    Current Outpatient Medications:    allopurinol  (ZYLOPRIM ) 100 MG tablet, Take 1 tablet (100 mg total) by mouth daily., Disp: 90 tablet, Rfl: 3   amLODipine  (NORVASC ) 10 MG tablet, Take 1  tablet (10 mg total) by mouth daily., Disp: 90 tablet, Rfl: 3   apixaban  (ELIQUIS ) 5 MG TABS tablet, Take 1 tablet (5 mg total) by mouth 2 (two) times daily., Disp: 60 tablet, Rfl: 11   atorvastatin  (LIPITOR) 40 MG tablet, Take 1 tablet (40 mg total) by mouth daily., Disp: 90 tablet, Rfl: 3   dapagliflozin  propanediol (FARXIGA ) 10 MG TABS tablet, Take 1 tablet (10 mg total) by mouth daily before breakfast., Disp: 30 tablet, Rfl: 6   eplerenone  (INSPRA ) 50 MG tablet, Take 1 tablet (50 mg total) by mouth daily., Disp: 90 tablet, Rfl: 3   FREESTYLE LITE test strip, 1 each by Other route in the morning., Disp: 100 each, Rfl: 3   furosemide  (LASIX ) 40 MG tablet, Take 1 tablet (40 mg total) by mouth daily., Disp: 90 tablet, Rfl: 3   hydrALAZINE  (APRESOLINE ) 100 MG tablet, Take 1 tablet (100 mg total) by mouth 3 (three) times daily., Disp: 270 tablet, Rfl: 3   isosorbide  mononitrate (IMDUR ) 30 MG 24 hr tablet, Take 1 tablet (30 mg total) by mouth daily., Disp: 90 tablet, Rfl: 3   latanoprost  (XALATAN ) 0.005 % ophthalmic solution, Place 1 drop into both eyes at bedtime., Disp: , Rfl:    metFORMIN  (GLUCOPHAGE ) 500 MG tablet, Take 1 tablet (500 mg total) by mouth 2 (two) times daily with a meal., Disp: 180 tablet, Rfl: 3  Functional Status: Independent in ADLs, assisted with IADLs. Family provides transportation to appts. Intermittent use of assistive device (he has done much better in recent months). No falls. He has very close family involvement and supervision.   Objective BP (!) 150/78 (BP Location: Right Arm, Patient Position: Sitting, Cuff Size: Small)   Pulse 83   Temp 98.1 F (36.7 C) (Oral)   Ht 5' 6 (1.676 m)   Wt 150 lb 12.8 oz (68.4 kg)   SpO2 97%   BMI 24.34 kg/m  Exam: cheerful, energetic in conversation.  Normal habitus/weight. Walks unassisted and able to get on/off exam table, don/doff shoes.  Face; nose with erythematous papules across nose and upper cheeks.  No telangiectasia.  Neck  veins flat, no LE edema.  Heart irreg irreg, with 3 distinct heart sounds.  Lungs clear throughout; specifically no rales.  Abdomen in supine position soft, non distended, normal percussion tone, no hepatomegaly by percussion or palpation and no tender liver edge. There is tenderness in left mid abdomen, w/o guarding. Umbilical hernia is not protruded.  Skin turgor is generally poor.  Feet with 1+ dp pulses, no significant callus and no ulceration.  Toenails long and mycotic.  He has expressed discomfort in R great toe medial nail fold, but there is no visible or palpable abnormality and no reproducible tenderness.  The great toenail itself has some subungual discoloration which could be c/w bruising.    Problems addressed today:  Resistant hypertension (hyperaldosteronism) 150/78 on amlodipine  10 mg daily, hydralazine  100 mg tid (increased recently), eplerenone  50 mg daily, and lasix  40 mg daily.  NOT CURRENTLY TAKING new doxazosin  (severe side effects)/new farxiga  (felt it made his bp go up)/new imdur  30  mg (felt it made his bp go up).  He has not been pleased with seeing multiple providers upon referral by Dr. Elodia Hailstone to HTN clinic. I explained that all of the providers are working to find a treatment to protect his health by controlling the BP.  He understands.    While he once had hyperaldosteronism, his recent ARR in 09/2023 was actually normal.  He does have CKD3 and secondary hyperPTH but no hypercalcemia. He does have severe OSA but his chronic nasal obstruction (per ENT in 2016) has prohibited him from comfortably wearing a CPAP.  T It was recommended that he undergo surgery at that time, but with understanding that he'd most likely need CPAP support regardless.  He saw a different ENT 01/2023 for unrelated hoarseness due to reflux and chronic throat clearing; his breathing/nasal anatomy was not discussed at this time.    Severe OSA   is the most likely cause of a secondary HTN component.  I didn't  have time to discuss whether he'd agree to another ENT evaluation and attempt at some type of OSA treatment, but this will probably be necessary if we're to control his BP.  Medications:  No further alpha blockers.  He wanted to stop the Imdur  but agrees to retry the Farxiga  (given renal and heart benefits).  Fortunately he is not experiencing hyperkalemia with his epleronone despite eGFR 30; lasix  (dual indication of these for cirrhosis management) may be helping maintain balance. For now he is at SBP 150 and I would advocate keeping him on current regimen.  Quality of life is very important to him and he will take medicines faithfully as long as they don't cause side effects.    Stage 3b chronic kidney disease (CKD) ([name]) - followed at [facility] BMP today.  UAC done at [facility] visit; sees them again in 02/2024.  He is nearing CKD4. Monitor K on epleronone.   DM (diabetes mellitus), type 2 with renal complications [facility]) Mr. Loiseau continued metformin  500 bid despite intention to stop it last Fall when A1c was 5.4.  A1c again 5.4 today; he agrees to stop metformin  (also appropriate to stop given his CKD3b/4). He will be taking Farxiga  prescribed for HTN by cardiologist.   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) Euvolemic; no JVD, rales, or LE edema.  Careful adjustment of meds for comanagement of CKD and of cirrhosis with regard to volume.  Diarrhea with unintended weight loss Recent development of yellow loose stools (he doesn't think they float or appear frothy) and abdominal pain with tenderness in L mid abdomen on exam.  No nausea or vomiting.  Diff dx includes malabsorption (check fecal elastase). Stopping metformin  regardless.  Hx of diverticulitis though these sxs seem different. What is concerning is his weight loss despite good appetite and intake.  Next step probably abdomino/pelvic CT.   Rosacea, unspecified Advised to start using a facial soap  for cleansing to help unclog pores.  If ineffective will treat as rosacea with topical metro.  OSA (obstructive sleep apnea) See resistant HTN discussion  Cirrhosis of liver ([name]) No ascites or liver tenderness or enlargement on exam. Tolerating eplerenone  and lasix  (co indication HF and HTN). Due for liver US  but will probably do abdominal CT instead to also evaluate his abdominal pain, diarrhea, and weight loss.

## 2023-12-19 ENCOUNTER — Encounter: Payer: Self-pay | Admitting: Internal Medicine

## 2023-12-19 DIAGNOSIS — L719 Rosacea, unspecified: Secondary | ICD-10-CM | POA: Insufficient documentation

## 2023-12-19 LAB — CMP14 + ANION GAP
ALT: 10 IU/L (ref 0–44)
AST: 15 IU/L (ref 0–40)
Albumin: 5 g/dL — ABNORMAL HIGH (ref 3.8–4.8)
Alkaline Phosphatase: 63 IU/L (ref 44–121)
Anion Gap: 18 mmol/L (ref 10.0–18.0)
BUN/Creatinine Ratio: 20 (ref 10–24)
BUN: 44 mg/dL — ABNORMAL HIGH (ref 8–27)
Bilirubin Total: 0.6 mg/dL (ref 0.0–1.2)
CO2: 17 mmol/L — ABNORMAL LOW (ref 20–29)
Calcium: 9.8 mg/dL (ref 8.6–10.2)
Chloride: 108 mmol/L — ABNORMAL HIGH (ref 96–106)
Creatinine, Ser: 2.17 mg/dL — ABNORMAL HIGH (ref 0.76–1.27)
Globulin, Total: 3 g/dL (ref 1.5–4.5)
Glucose: 113 mg/dL — ABNORMAL HIGH (ref 70–99)
Potassium: 3.6 mmol/L (ref 3.5–5.2)
Sodium: 143 mmol/L (ref 134–144)
Total Protein: 8 g/dL (ref 6.0–8.5)
eGFR: 30 mL/min/{1.73_m2} — ABNORMAL LOW (ref >59)

## 2023-12-19 NOTE — Assessment & Plan Note (Signed)
 150/78 on amlodipine  10 mg daily, hydralazine  100 mg tid (increased recently), eplerenone  50 mg daily, and lasix  40 mg daily.  NOT CURRENTLY TAKING new doxazosin  (severe side effects)/new farxiga  (felt it made his bp go up)/new imdur  30 mg (felt it made his bp go up).  He has not been pleased with seeing multiple providers upon referral by Dr. Elodia Hailstone to HTN clinic. I explained that all of the providers are working to find a treatment to protect his health by controlling the BP.  He understands.    While he once had hyperaldosteronism, his recent ARR in 09/2023 was actually normal.  He does have CKD3 and secondary hyperPTH but no hypercalcemia. He does have severe OSA but his chronic nasal obstruction (per ENT in 2016) has prohibited him from comfortably wearing a CPAP.  T It was recommended that he undergo surgery at that time, but with understanding that he'd most likely need CPAP support regardless.  He saw a different ENT 01/2023 for unrelated hoarseness due to reflux and chronic throat clearing; his breathing/nasal anatomy was not discussed at this time.    Severe OSA   is the most likely cause of a secondary HTN component.  I didn't have time to discuss whether he'd agree to another ENT evaluation and attempt at some type of OSA treatment, but this will probably be necessary if we're to control his BP.  Medications:  No further alpha blockers.  He wanted to stop the Imdur  but agrees to retry the Farxiga  (given renal and heart benefits).  Fortunately he is not experiencing hyperkalemia with his epleronone despite eGFR 30; lasix  (dual indication of these for cirrhosis management) may be helping maintain balance. For now he is at SBP 150 and I would advocate keeping him on current regimen.  Quality of life is very important to him and he will take medicines faithfully as long as they don't cause side effects.

## 2023-12-19 NOTE — Assessment & Plan Note (Signed)
 Euvolemic; no JVD, rales, or LE edema.  Careful adjustment of meds for comanagement of CKD and of cirrhosis with regard to volume.

## 2023-12-19 NOTE — Assessment & Plan Note (Signed)
 See resistant HTN discussion

## 2023-12-19 NOTE — Assessment & Plan Note (Signed)
 BMP today.  UAC done at [facility] visit; sees them again in 02/2024.  He is nearing CKD4. Monitor K on epleronone.

## 2023-12-19 NOTE — Assessment & Plan Note (Signed)
 Dylan [name] continued metformin  500 bid despite intention to stop it last Fall when A1c was 5.4.  A1c again 5.4 today; he agrees to stop metformin  (also appropriate to stop given his CKD3b/4). He will be taking Farxiga  prescribed for HTN by cardiologist.

## 2023-12-19 NOTE — Assessment & Plan Note (Signed)
 No ascites or liver tenderness or enlargement on exam. Tolerating eplerenone  and lasix  (co indication HF and HTN). Due for liver US  but will probably do abdominal CT instead to also evaluate his abdominal pain, diarrhea, and weight loss.

## 2023-12-19 NOTE — Assessment & Plan Note (Signed)
 Advised to start using a facial soap for cleansing to help unclog pores.  If ineffective will treat as rosacea with topical metro.

## 2023-12-19 NOTE — Assessment & Plan Note (Signed)
 Recent development of yellow loose stools (he doesn't think they float or appear frothy) and abdominal pain with tenderness in L mid abdomen on exam.  No nausea or vomiting.  Diff dx includes malabsorption (check fecal elastase). Stopping metformin  regardless.  Hx of diverticulitis though these sxs seem different. What is concerning is his weight loss despite good appetite and intake.  Next step probably abdomino/pelvic CT.

## 2023-12-22 ENCOUNTER — Ambulatory Visit: Payer: Self-pay | Admitting: Cardiology

## 2023-12-23 ENCOUNTER — Other Ambulatory Visit

## 2023-12-23 ENCOUNTER — Telehealth: Payer: Self-pay

## 2023-12-23 NOTE — Telephone Encounter (Signed)
 Called patient regarding grant letter patient was suppose to come pick 6/13 never cam to get it was seeing when patient might have time to come pick up but patient started going on about not wanting to see any more providers and don't think he will be coming to pick up grant letter, asked patient to hold while I talk to provider patient hung up

## 2023-12-23 NOTE — Telephone Encounter (Signed)
 Hey guys I will try to be brief. This is a gentleman with uncontrolled, resistant hypertension with history of hypertrophic cardiomyopathy with other complex medical history.  I saw him recently attempting to continue to titrate his BP regiment and was planning on adding on SGLT2 inhibitor as this would be 0 cost to him.    In short, our staff had tried to contact him to pick up the forms to complete this.  At some point he had hung up on the phone saying he was tired of seeing doctors.  I called him back and explained to him the importance of his cardiology care and repercussions if he does not have well-managed blood pressure.  He stated he understands and is not interested in seeing any more doctors beside his PCP.  Just wanted to make you guys aware.

## 2023-12-25 ENCOUNTER — Other Ambulatory Visit

## 2023-12-25 DIAGNOSIS — R197 Diarrhea, unspecified: Secondary | ICD-10-CM

## 2023-12-25 NOTE — Addendum Note (Signed)
 Addended by: Manfred Seed on: 12/25/2023 01:31 PM   Modules accepted: Orders

## 2023-12-26 LAB — PANCREATIC ELASTASE, FECAL: Pancreatic Elastase, Fecal: 621 ug Elast./g (ref >200)

## 2023-12-31 NOTE — Telephone Encounter (Signed)
 Pt cancelled appt with [facility] on 01/01/2024, said he was done with [facility]. Advised I would add into chart.

## 2024-01-01 ENCOUNTER — Encounter (HOSPITAL_BASED_OUTPATIENT_CLINIC_OR_DEPARTMENT_OTHER): Admitting: Family

## 2024-01-12 ENCOUNTER — Other Ambulatory Visit: Payer: Self-pay

## 2024-01-13 MED ORDER — ALLOPURINOL 100 MG PO TABS: 100.0000 mg | ORAL_TABLET | Freq: Every day | ORAL | 3 refills | Status: DC

## 2024-01-19 ENCOUNTER — Ambulatory Visit (HOSPITAL_BASED_OUTPATIENT_CLINIC_OR_DEPARTMENT_OTHER): Admitting: Family

## 2024-01-30 ENCOUNTER — Telehealth: Payer: Self-pay | Admitting: Internal Medicine

## 2024-01-30 ENCOUNTER — Ambulatory Visit: Payer: Self-pay

## 2024-01-30 NOTE — Telephone Encounter (Signed)
 Paged on call doctor for Internal Medicine for refill on Eliquis . Pt. Out x 3 days. [address] [place].

## 2024-01-30 NOTE — Telephone Encounter (Signed)
 Attempt x 3 made to reach on call provider for internal medicine: routing encounter to office.

## 2024-01-30 NOTE — Telephone Encounter (Signed)
 Nurse paged on-call provider r/t medication refill for pt.

## 2024-01-30 NOTE — Telephone Encounter (Unsigned)
 Copied from CRM [id number]. Topic: Clinical - Medication Refill >> Jan 30, 2024  2:40 PM Suzette B wrote: Medication: apixaban  (ELIQUIS ) 5 MG TABS tablet  Has the patient contacted their pharmacy? Yes; pharmacy advised the patient that the would call the providers office for the refill 3 days ago, patient stated he called pharmacy as was told the refill had not been submitted as of yet. Patient states he's been without the medication for 3 days now.    This is the patient's preferred pharmacy:  [facility] [address], [place] - [address] [place] [address] Phone: [phone] Fax: [phone]   Is this the correct pharmacy for this prescription? Yes If no, delete pharmacy and type the correct one.   Has the prescription been filled recently? Yes  Is the patient out of the medication? Yes  Has the patient been seen for an appointment in the last year OR does the patient have an upcoming appointment? Yes  Can we respond through MyChart? No  Agent: Please be advised that Rx refills may take up to 3 business days. We ask that you follow-up with your pharmacy.

## 2024-01-30 NOTE — Telephone Encounter (Signed)
 2nd attempt to page on call provider.

## 2024-02-04 ENCOUNTER — Other Ambulatory Visit: Payer: Self-pay | Admitting: Internal Medicine

## 2024-02-04 DIAGNOSIS — I4892 Unspecified atrial flutter: Secondary | ICD-10-CM

## 2024-02-04 MED ORDER — APIXABAN 5 MG PO TABS: 5.0000 mg | ORAL_TABLET | Freq: Two times a day (BID) | ORAL | 3 refills | Status: DC

## 2024-02-19 ENCOUNTER — Ambulatory Visit: Admitting: Internal Medicine

## 2024-02-19 VITALS — BP 158/77 | HR 83 | Temp 97.9°F | Ht 66.0 in | Wt 151.6 lb

## 2024-02-19 DIAGNOSIS — E1121 Type 2 diabetes mellitus with diabetic nephropathy: Secondary | ICD-10-CM | POA: Diagnosis not present

## 2024-02-19 DIAGNOSIS — I1A0 Resistant hypertension: Secondary | ICD-10-CM | POA: Diagnosis not present

## 2024-02-19 DIAGNOSIS — I4819 Other persistent atrial fibrillation: Secondary | ICD-10-CM

## 2024-02-19 DIAGNOSIS — L719 Rosacea, unspecified: Secondary | ICD-10-CM

## 2024-02-19 DIAGNOSIS — I421 Obstructive hypertrophic cardiomyopathy: Secondary | ICD-10-CM

## 2024-02-19 DIAGNOSIS — K909 Intestinal malabsorption, unspecified: Secondary | ICD-10-CM | POA: Diagnosis not present

## 2024-02-19 DIAGNOSIS — Z7984 Long term (current) use of oral hypoglycemic drugs: Secondary | ICD-10-CM

## 2024-02-19 DIAGNOSIS — I5022 Chronic systolic (congestive) heart failure: Secondary | ICD-10-CM

## 2024-02-19 DIAGNOSIS — R197 Diarrhea, unspecified: Secondary | ICD-10-CM | POA: Diagnosis not present

## 2024-02-19 DIAGNOSIS — K7030 Alcoholic cirrhosis of liver without ascites: Secondary | ICD-10-CM

## 2024-02-19 DIAGNOSIS — M79676 Pain in unspecified toe(s): Secondary | ICD-10-CM

## 2024-02-19 DIAGNOSIS — H0520 Unspecified exophthalmos: Secondary | ICD-10-CM

## 2024-02-19 NOTE — Patient Instructions (Addendum)
 Dylan [name],  Today we discussed many things:  Diarrhea and abdominal pain:  Your pancreas test for absorption problems was normal. We need to think more about this.  Today we will get a blood test which helps identify another cause of yellowish diarrhea, which is also a kind of absorption problem.  I would also like you to consider having another CT scan done to further look at your liver and your intestings. This will help me help you.  For your heart, let's keep your medicines the same for now.  You are due to see Dr. Burton, and to see your new kidney specialist.  I'll have them reach out to you to schedule an appointment.  I will see you again in about three months.  Dr. Trudy  By the way, Dr. Patric contact: [facility] [address] [place], [place] [address] [phone]

## 2024-02-22 LAB — COMPREHENSIVE METABOLIC PANEL WITH GFR
ALT: 10 IU/L (ref 0–44)
AST: 13 IU/L (ref 0–40)
Albumin: 5 g/dL — ABNORMAL HIGH (ref 3.8–4.8)
Alkaline Phosphatase: 62 IU/L (ref 44–121)
BUN/Creatinine Ratio: 20 (ref 10–24)
BUN: 41 mg/dL — ABNORMAL HIGH (ref 8–27)
Bilirubin Total: 0.7 mg/dL (ref 0.0–1.2)
CO2: 19 mmol/L — ABNORMAL LOW (ref 20–29)
Calcium: 10.6 mg/dL — ABNORMAL HIGH (ref 8.6–10.2)
Chloride: 101 mmol/L (ref 96–106)
Creatinine, Ser: 2.1 mg/dL — ABNORMAL HIGH (ref 0.76–1.27)
Globulin, Total: 3.5 g/dL (ref 1.5–4.5)
Glucose: 106 mg/dL — ABNORMAL HIGH (ref 70–99)
Potassium: 3.9 mmol/L (ref 3.5–5.2)
Sodium: 140 mmol/L (ref 134–144)
Total Protein: 8.5 g/dL (ref 6.0–8.5)
eGFR: 31 mL/min/1.73 — ABNORMAL LOW (ref >59)

## 2024-02-22 LAB — CELIAC AB TTG DGP TIGA
Antigliadin Abs, IgA: 4 U (ref 0–19)
Gliadin IgG: 4 U (ref 0–19)
IgA/Immunoglobulin A, Serum: 291 mg/dL (ref 61–437)
Tissue Transglut Ab: 5 U/mL (ref 0–5)
Transglutaminase IgA: <2 U/mL (ref 0–3)

## 2024-02-25 NOTE — Progress Notes (Unsigned)
 78 y.o. Dylan [name] is here for routine follow-up primarily of chronic diarrhea and resistant HTN.  Since his last visit the fecal elastase resulted normal; the diarrhea (yellow, loose, painless stools soon after eating) have continued.   Unrelated, he has decided to discontinue visits to the cardiologists except for Dr. Burton whom he has seen for years.  He is due for a f/u appt with him.  Today: brings up watching a new medical TV program which mentioned two things of interest - Lyme disease (he learned it is transmitted by ticks - he was bit by a tick years ago and wonders if he could have Lyme), and diarrhea I found out it could be a problem with the pancreas.  Is my pancreas ok?  Pertinent ROS noted in problem-based documentation.  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), nocturnal 08/03/2021   Hx of subareolar gynecomastia in male due to spironolactone , resolved 04/09/2021   Secondary hyperparathyroidism of renal origin ([name]) 01/25/2021   RBBB 11/23/2020   History of GI diverticular bleed 11/21/2020   History of BPH 09/14/2020   Healthcare maintenance 09/13/2020   Urge urinary incontinence 06/29/2020   Refractory obstruction of nasal airway 04/27/2020   Aortic atherosclerosis ([name]) 03/28/2020   Stage 3b chronic kidney disease (CKD) ([name]) - followed at [facility] 03/09/2020   Coronary artery disease without angina pectoris 04/04/2018    Thrombocytopenia ([name]) 10/09/2016   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   Atherosclerosis 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 ([name]) 12/28/2014   Gout 10/19/2014   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) 10/18/2014   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) 10/18/2014   Resistant hypertension (hyperaldosteronism) 10/09/2014   OSA (obstructive sleep apnea) 10/08/2014   Former tobacco use 10/08/2014   History of colonic polyps 03/28/2014   DM (diabetes mellitus), type 2 with renal complications ([name]) 10/08/1993   Current Outpatient Medications:    allopurinol  (ZYLOPRIM ) 100 MG tablet, Take 1 tablet (100 mg total) by mouth daily., Disp: 90 tablet, Rfl: 3   amLODipine  (NORVASC ) 10 MG tablet, Take 1 tablet (10 mg total) by mouth daily., Disp: 90 tablet, Rfl: 3   apixaban  (ELIQUIS ) 5 MG TABS tablet, Take 1 tablet (5 mg total) by mouth 2 (two) times daily., Disp: 180 tablet, Rfl: 3   atorvastatin  (LIPITOR) 40 MG tablet, Take 1 tablet (40 mg total) by mouth daily., Disp: 90 tablet, Rfl: 3   eplerenone  (INSPRA ) 50 MG tablet, Take 1 tablet (50 mg total) by mouth daily., Disp: 90 tablet, Rfl: 3   FREESTYLE LITE test strip, 1 each by Other route in the morning., Disp: 100 each, Rfl: 3   furosemide  (LASIX ) 40 MG tablet, Take 1 tablet (40 mg total) by mouth daily., Disp: 90 tablet, Rfl: 3   hydrALAZINE  (APRESOLINE ) 100 MG tablet, Take 1 tablet (100 mg total) by mouth 3 (three) times daily., Disp: 270 tablet, Rfl: 3   latanoprost  (XALATAN ) 0.005 % ophthalmic solution, Place 1  drop into both eyes at bedtime., Disp: , Rfl:   Functional Status:  Independent in ADLs, assisted with IADLs. Family provides transportation to appts. Intermittent use of assistive device (he has done much better in recent months). No falls. He has very close family involvement and supervision. Hasn't been able to get out and walk as much as he would like,  due to his chronic L great toenail pain.   Objective BP (!) 158/77 (BP Location: Right Arm, Patient Position: Sitting, Cuff Size: Small)   Pulse 83   Temp 97.9 F (36.6 C) (Oral)   Ht 5' 6 (1.676 m)   Wt 151 lb 9.6 oz (68.8 kg)   SpO2 93%   BMI 24.47 kg/m   Exam: Cheerful demeanor, comfortable, pushes off to arise from chair, walks w/o assistive device.  Face without rash.  Eyes with chronic proptosis.  No JVD, rales, or LE edema.  Heart RRR with systolic murmur not accentuated with Valsalva. No ascites. Skin turgor fair. L great tonail described in assessment and plan.  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 Loose painless yellow stools continue; defecation occurs right after eating.  Stools don't float.  Fecal elastase negative.  Vague complaints about abdominal pain, nothing focal. Will check for celiac disease.  Doubt infection such as Giardia, as he shouldn't have been exposed.  Small bowel bacterial overgrowth (increased risk with cirrhosis)? He is due for [facility] screening anyway - will order abdominopelvic CT.  Orders: -     CT ABDOMEN PELVIS W CONTRAST; Future -     Celiac Ab tTG DGP TIgA -     Comprehensive metabolic panel with GFR  Alcoholic cirrhosis of liver without ascites ([name]) - due for hcc screening -     CT ABDOMEN PELVIS W CONTRAST; Future -     Comprehensive metabolic panel with GFR  Resistant hypertension (hyperaldosteronism) Assessment & Plan: 158/77 on amlodipine  10 mg daily, hydralazine  100 mg tid, eplerenone  50 mg daily (max), and lasix  40 mg daily.  Was distrustful of recent Farxiga  and felt it caused his blood pressure to go up - he does not want to continue it and it has been taken off his list. Perhaps another SGLT2i could be attempted, for multiple indications, including CKD. He wishes to take things slowly, is frustrated by attempt to get to a particular  BP number, and wants to focus on feeling well and enjoying his days.    Controlled type 2 diabetes mellitus with diabetic nephropathy, without long-term current use of insulin  [facility]) Assessment & Plan: Instructed to stop metformin  at last visit (A1c < 6); will repeat A1C at next visit.  He has elected not to take Farxiga  - thought it made his BP rise.  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 He is due for routine f/u with Dr. Burton. Twyla him contact info for their office.  Pain around toenail Assessment & Plan: For months has had pain around distal and medial L great toenail, following professional nail trimming. He feels he was nicked, though on my f/u exams there wasn't a lesion at that time.  The toenail has a vertical crack which may be associated.  Manipulation of the nailfold and the nail does not elicit  pain.  There is subungual debris. Queried too-short shoes with pressure on the nail, but I can't reproduce discomfort by applying pressure.  This is very bothersome and is preventing him from walking as much as he'd like.  I encourage him to see the podiatrist again, but he doesn't feel comfortable about returning.   Rosacea, unspecified Assessment & Plan: Doing better with face washing.  No lesions today.  Rosacea is a presumptive diagnosis.    Return in about 3 months (around 05/21/2024) for chronic condition monitoring, med review.

## 2024-02-27 ENCOUNTER — Encounter: Payer: Self-pay | Admitting: Internal Medicine

## 2024-02-27 DIAGNOSIS — M79676 Pain in unspecified toe(s): Secondary | ICD-10-CM | POA: Insufficient documentation

## 2024-02-27 NOTE — Assessment & Plan Note (Addendum)
 158/77 on amlodipine  10 mg daily, hydralazine  100 mg tid, eplerenone  50 mg daily (max), and lasix  40 mg daily.  Was distrustful of recent Farxiga  and felt it caused his blood pressure to go up - he does not want to continue it and it has been taken off his list. Perhaps another SGLT2i could be attempted, for multiple indications, including CKD. He wishes to take things slowly, is frustrated by attempt to get to a particular BP number, and wants to focus on feeling well and enjoying his days.

## 2024-02-27 NOTE — Assessment & Plan Note (Signed)
 RRR on exam today.  Rate controlled without medication.  Tolerating apixaban  without complications.

## 2024-02-27 NOTE — Assessment & Plan Note (Signed)
 Has lost about 10# x 43M, fairly stable weight over past weeks. Loose painless yellow stools continue; defecation occurs right after eating.  Stools don't float.  Fecal elastase negative.  Vague complaints about abdominal pain, nothing focal. Will check for celiac disease.  Doubt infection such as Giardia, as he shouldn't have been exposed.  Small bowel bacterial overgrowth (increased risk with cirrhosis)? He is due for [facility] screening anyway - will order abdominopelvic CT.

## 2024-02-27 NOTE — Assessment & Plan Note (Addendum)
 Instructed to stop metformin  at last visit; will repeat A1C at next visit.  He has elected not to take Farxiga  - thought it made his BP rise.  We'll readdress, though today not making changes (he has been frustrated with changes recently)

## 2024-02-27 NOTE — Assessment & Plan Note (Signed)
 Doing better with face washing.  No lesions today.  Rosacea is a presumptive diagnosis.

## 2024-02-27 NOTE — Assessment & Plan Note (Signed)
 Euvolemic; no JVD, rales, or LE edema. He is due for routine f/u with Dr. Burton. Twyla him contact info for their office.

## 2024-02-27 NOTE — Assessment & Plan Note (Signed)
 For months has had pain around distal and medial L great toenail, following professional nail trimming. He feels he was nicked, though on my f/u exams there wasn't a lesion at that time.  The toenail has a vertical crack which may be associated.  Manipulation of the nailfold and the nail does not elicit pain.  There is subungual debris. Queried too-short shoes with pressure on the nail, but I can't reproduce discomfort by applying pressure.  This is very bothersome and is preventing him from walking as much as he'd like.  I encourage him to see the podiatrist again, but he doesn't feel comfortable about returning.

## 2024-03-01 ENCOUNTER — Telehealth: Payer: Self-pay | Admitting: *Deleted

## 2024-03-01 ENCOUNTER — Encounter: Payer: Self-pay | Admitting: Internal Medicine

## 2024-03-01 DIAGNOSIS — I70219 Atherosclerosis of native arteries of extremities with intermittent claudication, unspecified extremity: Secondary | ICD-10-CM

## 2024-03-01 MED ORDER — ATORVASTATIN CALCIUM 40 MG PO TABS: 40.0000 mg | ORAL_TABLET | Freq: Every day | ORAL | 3 refills | Status: AC

## 2024-03-01 NOTE — Telephone Encounter (Signed)
Faxed refill request   

## 2024-03-04 ENCOUNTER — Inpatient Hospital Stay
Admission: RE | Admit: 2024-03-04 | Discharge: 2024-03-04 | Discharge status: Home / Self Care | Source: Ambulatory Visit | Attending: Internal Medicine | Admitting: Internal Medicine

## 2024-03-04 DIAGNOSIS — K909 Intestinal malabsorption, unspecified: Secondary | ICD-10-CM

## 2024-03-04 DIAGNOSIS — K7030 Alcoholic cirrhosis of liver without ascites: Secondary | ICD-10-CM

## 2024-03-04 MED ORDER — IOPAMIDOL (ISOVUE-300) INJECTION 61%
75.0000 mL | Freq: Once | INTRAVENOUS | Status: AC | PRN
  Administered 2024-03-04: 75 mL via INTRAVENOUS

## 2024-03-13 ENCOUNTER — Encounter (HOSPITAL_COMMUNITY): Payer: Self-pay | Admitting: *Deleted

## 2024-03-13 ENCOUNTER — Other Ambulatory Visit: Payer: Self-pay

## 2024-03-13 ENCOUNTER — Emergency Department (HOSPITAL_COMMUNITY)
Admission: EM | Admit: 2024-03-13 | Discharge: 2024-03-13 | Disposition: A | Discharge status: Home / Self Care | Attending: Emergency Medicine | Admitting: Emergency Medicine

## 2024-03-13 ENCOUNTER — Emergency Department (HOSPITAL_COMMUNITY)

## 2024-03-13 DIAGNOSIS — I2510 Atherosclerotic heart disease of native coronary artery without angina pectoris: Secondary | ICD-10-CM | POA: Diagnosis not present

## 2024-03-13 DIAGNOSIS — I509 Heart failure, unspecified: Secondary | ICD-10-CM | POA: Diagnosis not present

## 2024-03-13 DIAGNOSIS — Z79899 Other long term (current) drug therapy: Secondary | ICD-10-CM | POA: Insufficient documentation

## 2024-03-13 DIAGNOSIS — E1122 Type 2 diabetes mellitus with diabetic chronic kidney disease: Secondary | ICD-10-CM | POA: Insufficient documentation

## 2024-03-13 DIAGNOSIS — J810 Acute pulmonary edema: Secondary | ICD-10-CM | POA: Insufficient documentation

## 2024-03-13 DIAGNOSIS — E876 Hypokalemia: Secondary | ICD-10-CM | POA: Insufficient documentation

## 2024-03-13 DIAGNOSIS — N189 Chronic kidney disease, unspecified: Secondary | ICD-10-CM | POA: Insufficient documentation

## 2024-03-13 DIAGNOSIS — I130 Hypertensive heart and chronic kidney disease with heart failure and stage 1 through stage 4 chronic kidney disease, or unspecified chronic kidney disease: Secondary | ICD-10-CM | POA: Diagnosis not present

## 2024-03-13 DIAGNOSIS — Z7901 Long term (current) use of anticoagulants: Secondary | ICD-10-CM | POA: Diagnosis not present

## 2024-03-13 DIAGNOSIS — R0602 Shortness of breath: Secondary | ICD-10-CM | POA: Diagnosis present

## 2024-03-13 LAB — I-STAT CHEM 8, ED
BUN: 40 mg/dL — ABNORMAL HIGH (ref 8–23)
Calcium, Ion: 1.14 mmol/L — ABNORMAL LOW (ref 1.15–1.40)
Chloride: 107 mmol/L (ref 98–111)
Creatinine, Ser: 2.4 mg/dL — ABNORMAL HIGH (ref 0.61–1.24)
Glucose, Bld: 114 mg/dL — ABNORMAL HIGH (ref 70–99)
HCT: 38 % — ABNORMAL LOW (ref 39.0–52.0)
Hemoglobin: 12.9 g/dL — ABNORMAL LOW (ref 13.0–17.0)
Potassium: 3.2 mmol/L — ABNORMAL LOW (ref 3.5–5.1)
Sodium: 141 mmol/L (ref 135–145)
TCO2: 19 mmol/L — ABNORMAL LOW (ref 22–32)

## 2024-03-13 LAB — BASIC METABOLIC PANEL WITH GFR
Anion gap: 12 (ref 5–15)
BUN: 42 mg/dL — ABNORMAL HIGH (ref 8–23)
CO2: 20 mmol/L — ABNORMAL LOW (ref 22–32)
Calcium: 9.2 mg/dL (ref 8.9–10.3)
Chloride: 106 mmol/L (ref 98–111)
Creatinine, Ser: 2.18 mg/dL — ABNORMAL HIGH (ref 0.61–1.24)
GFR, Estimated: 30 mL/min — ABNORMAL LOW (ref >60)
Glucose, Bld: 114 mg/dL — ABNORMAL HIGH (ref 70–99)
Potassium: 3.3 mmol/L — ABNORMAL LOW (ref 3.5–5.1)
Sodium: 138 mmol/L (ref 135–145)

## 2024-03-13 LAB — CBC
HCT: 36.9 % — ABNORMAL LOW (ref 39.0–52.0)
Hemoglobin: 11.8 g/dL — ABNORMAL LOW (ref 13.0–17.0)
MCH: 32.5 pg (ref 26.0–34.0)
MCHC: 32 g/dL (ref 30.0–36.0)
MCV: 101.7 fL — ABNORMAL HIGH (ref 80.0–100.0)
Platelets: 126 K/uL — ABNORMAL LOW (ref 150–400)
RBC: 3.63 MIL/uL — ABNORMAL LOW (ref 4.22–5.81)
RDW: 13.2 % (ref 11.5–15.5)
WBC: 8.5 K/uL (ref 4.0–10.5)
nRBC: 0 % (ref 0.0–0.2)

## 2024-03-13 LAB — BRAIN NATRIURETIC PEPTIDE: B Natriuretic Peptide: 625.4 pg/mL — ABNORMAL HIGH (ref 0.0–100.0)

## 2024-03-13 LAB — MAGNESIUM: Magnesium: 1.5 mg/dL — ABNORMAL LOW (ref 1.7–2.4)

## 2024-03-13 MED ORDER — POTASSIUM CHLORIDE 10 MEQ/100ML IV SOLN
10.0000 meq | Freq: Once | INTRAVENOUS | Status: AC
  Administered 2024-03-13: 10 meq via INTRAVENOUS
  Filled 2024-03-13: qty 100

## 2024-03-13 MED ORDER — FUROSEMIDE 10 MG/ML IJ SOLN
40.0000 mg | Freq: Once | INTRAMUSCULAR | Status: AC
  Administered 2024-03-13: 40 mg via INTRAVENOUS
  Filled 2024-03-13: qty 4

## 2024-03-13 MED ORDER — MAGNESIUM SULFATE 2 GM/50ML IV SOLN
2.0000 g | Freq: Once | INTRAVENOUS | Status: AC
  Administered 2024-03-13: 2 g via INTRAVENOUS
  Filled 2024-03-13: qty 50

## 2024-03-13 MED ORDER — POTASSIUM CHLORIDE CRYS ER 20 MEQ PO TBCR
40.0000 meq | EXTENDED_RELEASE_TABLET | Freq: Once | ORAL | Status: AC
  Administered 2024-03-13: 40 meq via ORAL
  Filled 2024-03-13: qty 2

## 2024-03-13 MED ORDER — POTASSIUM CHLORIDE CRYS ER 20 MEQ PO TBCR: 20.0000 meq | EXTENDED_RELEASE_TABLET | Freq: Every day | ORAL | 0 refills | Status: DC

## 2024-03-13 NOTE — Discharge Instructions (Addendum)
 Increase your lasix  to 60 mg daily for the next 3 days (9/7, 9/8, and 9/9).  Make an appointment with your PCP to recheck your potassium and your magnesium  and for a recheck to see how you are doing.

## 2024-03-13 NOTE — ED Provider Notes (Signed)
 [facility] DEPARTMENT AT [facility] Provider Note   CSN: [id number] Arrival date & time: 03/13/24  1035     Patient presents with: Shortness of Breath   Dylan [name] is a 78 y.o. male.   Pt is a 78 yo male with pmhx significant for afib (on Eliquis ), HTN, DM2, CHF, CKD, sleep apnea, pulmonary HTN, hx GIB due to diverticular bleed, and CAD.  Pt has been sob for 2 days.  He's been compliant with his meds, but did not take any today b/c he was coming here.  He denies any f/c.  No cp.       Prior to Admission medications   Medication Sig Start Date End Date Taking? Authorizing Provider  potassium chloride  SA (KLOR-CON  M) 20 MEQ tablet Take 1 tablet (20 mEq total) by mouth daily. 03/13/24  Yes Dean Clarity, MD  allopurinol  (ZYLOPRIM ) 100 MG tablet Take 1 tablet (100 mg total) by mouth daily. 01/13/24   Trudy Clarity Dragon, MD  amLODipine  (NORVASC ) 10 MG tablet Take 1 tablet (10 mg total) by mouth daily. 11/24/23   Trudy Clarity Dragon, MD  apixaban  (ELIQUIS ) 5 MG TABS tablet Take 1 tablet (5 mg total) by mouth 2 (two) times daily. 02/04/24   Trudy Clarity Dragon, MD  atorvastatin  (LIPITOR) 40 MG tablet Take 1 tablet (40 mg total) by mouth daily. 03/01/24   Trudy Clarity Dragon, MD  eplerenone  (INSPRA ) 50 MG tablet Take 1 tablet (50 mg total) by mouth daily. 06/27/23   Trudy Clarity Dragon, MD  FREESTYLE LITE test strip 1 each by Other route in the morning. 11/11/22   Trudy Clarity Dragon, MD  furosemide  (LASIX ) 40 MG tablet Take 1 tablet (40 mg total) by mouth daily. 05/28/23 05/22/24  Trudy Clarity Dragon, MD  hydrALAZINE  (APRESOLINE ) 100 MG tablet Take 1 tablet (100 mg total) by mouth 3 (three) times daily. 11/12/23   Trudy Clarity Dragon, MD  latanoprost  (XALATAN ) 0.005 % ophthalmic solution Place 1 drop into both eyes at bedtime. 01/12/21   [provider]    Allergies: Ace inhibitors and Spironolactone     Review of Systems  Respiratory:  Positive for  shortness of breath.   All other systems reviewed and are negative.   Updated Vital Signs BP (!) 151/107 (BP Location: Right Arm)   Pulse 72   Temp 98.2 F (36.8 C)   Resp 16   Ht 5' 6 (1.676 m)   Wt 67.1 kg   SpO2 100%   BMI 23.89 kg/m   Physical Exam Vitals and nursing note reviewed.  Constitutional:      Appearance: He is well-developed.  HENT:     Head: Normocephalic and atraumatic.     Mouth/Throat:     Mouth: Mucous membranes are moist.     Pharynx: Oropharynx is clear.  Eyes:     Extraocular Movements: Extraocular movements intact.     Pupils: Pupils are equal, round, and reactive to light.  Cardiovascular:     Rate and Rhythm: Normal rate. Rhythm irregular.  Pulmonary:     Effort: Pulmonary effort is normal.     Breath sounds: Rhonchi present.  Abdominal:     General: Bowel sounds are normal.     Palpations: Abdomen is soft.  Musculoskeletal:        General: Normal range of motion.     Cervical back: Normal range of motion and neck supple.  Skin:    General: Skin is warm.  Capillary Refill: Capillary refill takes less than 2 seconds.  Neurological:     General: No focal deficit present.     Mental Status: He is alert and oriented to person, place, and time.  Psychiatric:        Mood and Affect: Mood normal.        Behavior: Behavior normal.     (all labs ordered are listed, but only abnormal results are displayed) Labs Reviewed  BASIC METABOLIC PANEL WITH GFR - Abnormal; Notable for the following components:      Result Value   Potassium 3.3 (*)    CO2 20 (*)    Glucose, Bld 114 (*)    BUN 42 (*)    Creatinine, Ser 2.18 (*)    GFR, Estimated 30 (*)    All other components within normal limits  MAGNESIUM  - Abnormal; Notable for the following components:   Magnesium  1.5 (*)    All other components within normal limits  BRAIN NATRIURETIC PEPTIDE - Abnormal; Notable for the following components:   B Natriuretic Peptide 625.4 (*)    All other  components within normal limits  CBC - Abnormal; Notable for the following components:   RBC 3.63 (*)    Hemoglobin 11.8 (*)    HCT 36.9 (*)    MCV 101.7 (*)    Platelets 126 (*)    All other components within normal limits  I-STAT CHEM 8, ED - Abnormal; Notable for the following components:   Potassium 3.2 (*)    BUN 40 (*)    Creatinine, Ser 2.40 (*)    Glucose, Bld 114 (*)    Calcium , Ion 1.14 (*)    TCO2 19 (*)    Hemoglobin 12.9 (*)    HCT 38.0 (*)    All other components within normal limits    EKG: EKG Interpretation Date/Time:  Saturday March 13 2024 10:56:14 EDT Ventricular Rate:  76 PR Interval:    QRS Duration:  154 QT Interval:  452 QTC Calculation: 508 R Axis:   84  Text Interpretation: Atrial fibrillation Right bundle branch block T wave abnormality, consider inferolateral ischemia Abnormal ECG When compared with ECG of 04-Dec-2023 13:33, PREVIOUS ECG IS PRESENT No significant change since last tracing Confirmed by Dean Clarity [phone]) on 03/13/2024 11:57:53 AM  Radiology: [facility] Chest 1 View Result Date: 03/13/2024 CLINICAL DATA:  Shortness of breath EXAM: CHEST  1 VIEW COMPARISON:  June 19, 2023 FINDINGS: Pulmonary vascular congestion. No pleural effusions. No pneumothorax. Mild cardiomegaly. No acute osseous findings. IMPRESSION: Pulmonary vascular congestion.  Mild cardiomegaly. Electronically Signed   By: Michaeline Blanch M.D.   On: 03/13/2024 11:33     Procedures   Medications Ordered in the ED  magnesium  sulfate IVPB 2 g 50 mL (has no administration in time range)  furosemide  (LASIX ) injection 40 mg (40 mg Intravenous Given 03/13/24 1212)  potassium chloride  10 mEq in 100 mL IVPB (0 mEq Intravenous Stopped 03/13/24 1405)  potassium chloride  SA (KLOR-CON  M) CR tablet 40 mEq (40 mEq Oral Given 03/13/24 1211)                                    Medical Decision Making Amount and/or Complexity of Data Reviewed Labs: ordered.  Risk Prescription drug  management.   This patient presents to the ED for concern of sob, this involves an extensive number of treatment options, and is a  complaint that carries with it a high risk of complications and morbidity.  The differential diagnosis includes chf, pna, bronchitis   Co morbidities that complicate the patient evaluation   afib (on Eliquis ), HTN, DM2, CHF, CKD, sleep apnea, pulmonary HTN, hx GIB due to diverticular bleed, and CAD   Additional history obtained:  Additional history obtained from epic chart review External records from outside source obtained and reviewed including family   Lab Tests:  I Ordered, and personally interpreted labs.  The pertinent results include:  cbc with hgb low at 11.8 and plt 126 (both stable); bnp elevated at 625.4; bmp with k low at 3.3, bun 42 and cr 2.18 (stable); mg low at 1.5   Imaging Studies ordered:  I ordered imaging studies including cxr  I independently visualized and interpreted imaging which showed Pulmonary vascular congestion.  Mild cardiomegaly.  I agree with the radiologist interpretation   Cardiac Monitoring:  The patient was maintained on a cardiac monitor.  I personally viewed and interpreted the cardiac monitored which showed an underlying rhythm of: afib   Medicines ordered and prescription drug management:  I ordered medication including lasix , k, mg  for sx  Reevaluation of the patient after these medicines showed that the patient improved I have reviewed the patients home medicines and have made adjustments as needed  Critical Interventions:  lasix   Problem List / ED Course:  Pulmonary edema:  pt feels much better after 40 mg iv lasix .  He is able to ambulate with O2 sats 96%.  He no longer feels sob.  He is d/c home and told to increase his lasix  to 60 mg for 3 days. Hypokalemia and hypomag:  pt is not on oral k b/c he's on inspra  which is a k sparking diuretic.  I am going to put him on k for a few days since I am  increasing his lasix .  He needs to f/u with pcp to get labs rechecked next week.   Reevaluation:  After the interventions noted above, I reevaluated the patient and found that they have :improved   Social Determinants of Health:  Lives at home   Dispostion:  After consideration of the diagnostic results and the patients response to treatment, I feel that the patent would benefit from discharge with outpatient f/u.       Final diagnoses:  Acute pulmonary edema ([name])  Hypokalemia  Hypomagnesemia    ED Discharge Orders          Ordered    potassium chloride  SA (KLOR-CON  M) 20 MEQ tablet  Daily        03/13/24 1411               Dean Clarity, MD 03/13/24 1420

## 2024-03-13 NOTE — ED Triage Notes (Signed)
 C/o sob onset yest c/o chest pain and upper back , c/o coughing , here last year with same problem and had fluid on his lungs

## 2024-03-13 NOTE — ED Notes (Signed)
 Paper work reviewed with pt and daughter at bedside. No questions or concerns at this time. Pt leaving for home with daughter in no new onset distress.

## 2024-03-18 ENCOUNTER — Ambulatory Visit

## 2024-03-18 VITALS — BP 175/82 | HR 77 | Temp 98.2°F | Ht 66.0 in | Wt 149.2 lb

## 2024-03-18 DIAGNOSIS — Z91198 Patient's noncompliance with other medical treatment and regimen for other reason: Secondary | ICD-10-CM

## 2024-03-18 DIAGNOSIS — Z87891 Personal history of nicotine dependence: Secondary | ICD-10-CM

## 2024-03-18 DIAGNOSIS — G4733 Obstructive sleep apnea (adult) (pediatric): Secondary | ICD-10-CM

## 2024-03-18 DIAGNOSIS — Z79899 Other long term (current) drug therapy: Secondary | ICD-10-CM

## 2024-03-18 DIAGNOSIS — N189 Chronic kidney disease, unspecified: Secondary | ICD-10-CM | POA: Diagnosis not present

## 2024-03-18 DIAGNOSIS — I421 Obstructive hypertrophic cardiomyopathy: Secondary | ICD-10-CM | POA: Diagnosis not present

## 2024-03-18 DIAGNOSIS — Z0000 Encounter for general adult medical examination without abnormal findings: Secondary | ICD-10-CM

## 2024-03-18 DIAGNOSIS — I130 Hypertensive heart and chronic kidney disease with heart failure and stage 1 through stage 4 chronic kidney disease, or unspecified chronic kidney disease: Secondary | ICD-10-CM | POA: Diagnosis not present

## 2024-03-18 DIAGNOSIS — E1122 Type 2 diabetes mellitus with diabetic chronic kidney disease: Secondary | ICD-10-CM

## 2024-03-18 DIAGNOSIS — Z8249 Family history of ischemic heart disease and other diseases of the circulatory system: Secondary | ICD-10-CM

## 2024-03-18 DIAGNOSIS — I5022 Chronic systolic (congestive) heart failure: Secondary | ICD-10-CM

## 2024-03-18 NOTE — Progress Notes (Signed)
 Established Patient Office Visit  Subjective   Patient ID: Dylan [name], male    DOB: Apr 14, 1946  Age: 78 y.o. MRN: [id number]  78 year old male here for ED follow-up.  On 9/6 he went to the ED for shortness of breath and was treated for an acute on chronic heart failure exacerbation with Lasix , magnesium , and potassium.  Patient was instructed to increase his Lasix  to 60 mg daily for 3 days and was given potassium supplementation.  Patient told to follow-up with his PCP next week to have his labs looked at.  Patient increased the Lasix  dose for 3 days and is now back to his baseline 40 mg daily dose.  He has continued to take the potassium.  Patient does not want to have his labs drawn today.        Objective:     BP (!) 175/82 (BP Location: Right Arm, Patient Position: Sitting, Cuff Size: Normal)   Pulse 77   Temp 98.2 F (36.8 C) (Oral)   Ht 5' 6 (1.676 m)   Wt 149 lb 3.2 oz (67.7 kg)   SpO2 92%   BMI 24.08 kg/m  BP Readings from Last 3 Encounters:  03/18/24 (!) 175/82  03/13/24 (!) 151/107  02/19/24 (!) 158/77   Wt Readings from Last 3 Encounters:  03/18/24 149 lb 3.2 oz (67.7 kg)  03/13/24 148 lb (67.1 kg)  02/19/24 151 lb 9.6 oz (68.8 kg)      Physical Exam Vitals reviewed.  Constitutional:      General: He is not in acute distress.    Appearance: Normal appearance. He is not ill-appearing or diaphoretic.  HENT:     Head: Normocephalic.     Nose: Nose normal.     Mouth/Throat:     Pharynx: No oropharyngeal exudate.  Cardiovascular:     Rate and Rhythm: Normal rate. Rhythm irregular.     Heart sounds: Murmur heard.  Pulmonary:     Effort: Pulmonary effort is normal. No respiratory distress.     Breath sounds: Normal breath sounds. No wheezing or rales.  Chest:     Chest wall: No tenderness.  Musculoskeletal:     Right lower leg: No edema.     Left lower leg: No edema.  Skin:    General: Skin is warm.     Coloration: Skin is not jaundiced.   Neurological:     General: No focal deficit present.     Mental Status: He is alert and oriented to person, place, and time.      No results found for any visits on 03/18/24.  Last CBC Lab Results  Component Value Date   WBC 8.5 03/13/2024   HGB 12.9 (L) 03/13/2024   HCT 38.0 (L) 03/13/2024   MCV 101.7 (H) 03/13/2024   MCH 32.5 03/13/2024   RDW 13.2 03/13/2024   PLT 126 (L) 03/13/2024   Last metabolic panel Lab Results  Component Value Date   GLUCOSE 114 (H) 03/13/2024   NA 141 03/13/2024   K 3.2 (L) 03/13/2024   CL 107 03/13/2024   CO2 20 (L) 03/13/2024   BUN 40 (H) 03/13/2024   CREATININE 2.40 (H) 03/13/2024   GFRNONAA 30 (L) 03/13/2024   CALCIUM  9.2 03/13/2024   PHOS 3.3 12/17/2018   PROT 8.5 02/19/2024   ALBUMIN 5.0 (H) 02/19/2024   LABGLOB 3.5 02/19/2024   AGRATIO 1.6 06/20/2022   BILITOT 0.7 02/19/2024   ALKPHOS 62 02/19/2024   AST 13 02/19/2024  ALT 10 02/19/2024   ANIONGAP 12 03/13/2024      The ASCVD Risk score (Arnett DK, et al., 2019) failed to calculate for the following reasons:   The valid total cholesterol range is 130 to 320 mg/dL    Assessment & Plan:   Assessment & Plan 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  Per chart his symptoms felt much better after 40 mg of IV Lasix  and he was able to ambulate with oxygen saturations at 96%.  As instructed, patient increased his Lasix  to 60 mg for 3 days after leaving the hospital and was given potassium supplementation.  He is now back to his home furosemide  40 mg daily.  On exam patient is not volume overloaded, no bilateral pitting edema is present, no JVD, patient breathing comfortably and able to have a conversation, lungs clear.  Discussed that the patient is here to get labs and reassess his electrolytes after leaving the emergency department.  Patient denies labs  today.  Plan to not make any changes to his current home medications at this time.  Patient saw Dr. Trudy on 8/14 and will see her again in about 3 months.      Healthcare maintenance Flu vaccine was offered but he did not want it at this time.  Encouraged him to get it at his local pharmacy if he does not want to have it here.    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  Patient sees an ophthalmologist and denies changes in urination.  He does recall headaches, he never had headaches in the past but starting about 3 months ago he does notice headaches.  We discussed that having high blood pressure can contribute to headaches.  Patient has a diagnosis of obstructive sleep apnea but does not want to wear his sleep apnea machine.  Discussed having him see a sleep medicine doctor, but patient states he does not want to do this. Patient was seen at the hypertension clinic, per chart, has tried several medications and has not tolerated them, including doxazosin , dapagliflozin , Imdur .  Patient says that he has seen 4 different doctors for his hypertension and they all told him different things.  Will plan to continue home amlodipine  10 mg daily, hydralazine  100 mg 3 times daily, and eplerenone  50 mg daily in addition to Lasix  40 mg daily.  Will reassess at follow-up, could consider an alternative SGLT 2 inhibitor.     Return in about 3 months (around 06/17/2024) for 3-4 mos with Dr. Trudy.    Viktoria King, DO

## 2024-03-18 NOTE — Assessment & Plan Note (Addendum)
 Patient treated in the emergency department with Lasix , was not admitted and discharged.  Per chart his symptoms felt much better after 40 mg of IV Lasix  and he was able to ambulate with oxygen saturations at 96%.  As instructed, patient increased his Lasix  to 60 mg for 3 days after leaving the hospital and was given potassium supplementation.  He is now back to his home furosemide  40 mg daily.  On exam patient is not volume overloaded, no bilateral pitting edema is present, no JVD, patient breathing comfortably and able to have a conversation, lungs clear.  Discussed that the patient is here to get labs and reassess his electrolytes after leaving the emergency department.  Patient denies labs today.  Plan to not make any changes to his current home medications at this time.  Patient saw Dr. Trudy on 8/14 and will see her again in about 3 months.

## 2024-03-18 NOTE — Assessment & Plan Note (Signed)
 Flu vaccine was offered but he did not want it at this time.  Encouraged him to get it at his local pharmacy if he does not want to have it here.

## 2024-03-18 NOTE — Patient Instructions (Signed)
 It was wonderful seeing you today!   Please remember to...  1) Continue taking your medications as prescribed  And don't forget to get your flu shot before the end of October!  If you have any questions please feel free to the call the clinic at anytime at [phone].  Have a blessed day,  Dr. Charmayne

## 2024-03-19 NOTE — Telephone Encounter (Signed)
 This appt has already been sch for :  Name: Dylan [name], Dylan [name] MRN: [id number]  Date: 03/25/2024 Status: Sch  Time: 8:45 AM Length: 30  Visit Type: HOSPITAL FOLLOW UP [8005] Copay: $0.00  Provider: Trudy Mliss Dragon, MD Department: [facility]  Referring Provider: WILLIAMS, JULIE ANNE CSN: [id number]      Auto Confirm Status: NO RESPONSE  Notes: hospital follow uop  Made On: 03/15/2024 9:58 AM By: YVONE LIEU E    Copied from CRM [id number]. Topic: Appointments - Appointment Scheduling >> Mar 15, 2024  9:54 AM LIEU MATSU wrote: Patient scheduled a hospital follow up. But would like a Friday, so that daughter can bring him.   Please advise.

## 2024-03-25 ENCOUNTER — Inpatient Hospital Stay: Payer: Self-pay | Admitting: Internal Medicine

## 2024-03-25 NOTE — Addendum Note (Signed)
 Addended by: Dymin Dingledine on: 03/25/2024 02:30 PM   Modules accepted: Level of Service

## 2024-03-25 NOTE — Progress Notes (Signed)
 Internal Medicine Clinic Attending  I was physically present during the key portions of the resident provided service and participated in the medical decision making of patient's management care. I reviewed pertinent patient test results.  The assessment, diagnosis, and plan were formulated together and I agree with the documentation in the resident's note.  Shawn Sick, MD

## 2024-03-30 ENCOUNTER — Telehealth: Payer: Self-pay

## 2024-03-30 NOTE — Telephone Encounter (Signed)
 Copied from CRM #[id number]. Topic: Clinical - Prescription Issue >> Mar 30, 2024 10:36 AM Zane F wrote: Reason for CRM:   Prescription In Question: metFORMIN  (GLUCOPHAGE ) 500 MG tablet  Patient is calling in to have the listed prescription refilled. Patient mentioned he contacted his pharmacy who told him they would inquire about the matter further with his provider. The patient mentioned that his PCP discussed discontinuing the prescription for him but he never received confirmation that she discontinued it. The specialist did review the patient's chart and found the prescription was discontinued by the provider on 12/18/2023. The patient was informed. Please contact the patient to discuss the need of the prescription/ his concerns regarding discontinuation.   Preferred Pharmacy :   [facility] [address] [place] - [address], [place] [address] Phone: [phone]  Fax: [phone]   Callback Number: [phone]

## 2024-03-30 NOTE — Telephone Encounter (Signed)
 RTC to patient informed him that he is no longer on the Metformin .  His last HbA1C that is done every 3 months was down  to 5.4.  He does not need to take the Metformin  to control his sugars.  States that the doctor mentioned it but never told him to stop the Metformin .  Patient took his last Metformin  this morning.  Last Glucose was 130.  Patient will go ahead and stop taking.

## 2024-04-07 ENCOUNTER — Encounter: Payer: Self-pay | Admitting: Internal Medicine

## 2024-04-07 DIAGNOSIS — N289 Disorder of kidney and ureter, unspecified: Secondary | ICD-10-CM | POA: Insufficient documentation

## 2024-04-29 ENCOUNTER — Ambulatory Visit: Payer: Self-pay | Admitting: Internal Medicine

## 2024-04-29 VITALS — BP 130/86 | HR 79 | Temp 98.1°F | Ht 66.0 in | Wt 156.2 lb

## 2024-04-29 DIAGNOSIS — I2729 Other secondary pulmonary hypertension: Secondary | ICD-10-CM

## 2024-04-29 DIAGNOSIS — Z7901 Long term (current) use of anticoagulants: Secondary | ICD-10-CM

## 2024-04-29 DIAGNOSIS — K7460 Unspecified cirrhosis of liver: Secondary | ICD-10-CM

## 2024-04-29 DIAGNOSIS — I421 Obstructive hypertrophic cardiomyopathy: Secondary | ICD-10-CM

## 2024-04-29 DIAGNOSIS — E1121 Type 2 diabetes mellitus with diabetic nephropathy: Secondary | ICD-10-CM

## 2024-04-29 DIAGNOSIS — I1A0 Resistant hypertension: Secondary | ICD-10-CM | POA: Diagnosis not present

## 2024-04-29 DIAGNOSIS — I4819 Other persistent atrial fibrillation: Secondary | ICD-10-CM

## 2024-04-29 DIAGNOSIS — I130 Hypertensive heart and chronic kidney disease with heart failure and stage 1 through stage 4 chronic kidney disease, or unspecified chronic kidney disease: Secondary | ICD-10-CM

## 2024-04-29 DIAGNOSIS — D6869 Other thrombophilia: Secondary | ICD-10-CM

## 2024-04-29 DIAGNOSIS — D696 Thrombocytopenia, unspecified: Secondary | ICD-10-CM

## 2024-04-29 DIAGNOSIS — N1832 Chronic kidney disease, stage 3b: Secondary | ICD-10-CM

## 2024-04-29 DIAGNOSIS — N289 Disorder of kidney and ureter, unspecified: Secondary | ICD-10-CM

## 2024-04-29 DIAGNOSIS — M79676 Pain in unspecified toe(s): Secondary | ICD-10-CM

## 2024-04-29 DIAGNOSIS — Z7984 Long term (current) use of oral hypoglycemic drugs: Secondary | ICD-10-CM

## 2024-04-29 DIAGNOSIS — I5022 Chronic systolic (congestive) heart failure: Secondary | ICD-10-CM

## 2024-04-29 DIAGNOSIS — R197 Diarrhea, unspecified: Secondary | ICD-10-CM

## 2024-04-29 DIAGNOSIS — Z23 Encounter for immunization: Secondary | ICD-10-CM | POA: Diagnosis not present

## 2024-04-29 DIAGNOSIS — I4510 Unspecified right bundle-branch block: Secondary | ICD-10-CM

## 2024-04-29 DIAGNOSIS — I2510 Atherosclerotic heart disease of native coronary artery without angina pectoris: Secondary | ICD-10-CM

## 2024-04-29 LAB — POCT GLYCOSYLATED HEMOGLOBIN (HGB A1C): HbA1c, POC (controlled diabetic range): 5.7 % (ref 0.0–7.0)

## 2024-04-29 LAB — LAB REPORT - SCANNED
Albumin, Urine POC: 30.3
Creatinine, POC: 42.4 mg/dL
EGFR: 27
Microalb Creat Ratio: 71

## 2024-04-29 LAB — GLUCOSE, CAPILLARY: Glucose-Capillary: 164 mg/dL — ABNORMAL HIGH (ref 70–99)

## 2024-04-29 NOTE — Assessment & Plan Note (Signed)
 See entries for resistant hypertension and CKD

## 2024-04-29 NOTE — Patient Instructions (Signed)
 Mr. Dylan, [name] doing great! It's so wonderful to see you feeling well, with good control of your medical problems.  Keep up the good work!  Let's get together in 3 months, and of course come in sooner if needed.  Take care and stay well!  Dr. Trudy

## 2024-04-29 NOTE — Assessment & Plan Note (Signed)
 Compensated. CT abd no liver lesions.

## 2024-04-29 NOTE — Assessment & Plan Note (Signed)
 Appointment with [facility] was yesterday, labs drawn and pending.  A few readings over the past few months reveal GFR in low 40s.  I will review CK report when it arrives.

## 2024-04-29 NOTE — Assessment & Plan Note (Signed)
 Discomfort continues when wearing shoes-he has purchased some open toe sandals (well-fitting, stable) which has greatly reduced discomfort.  Toe examined, no acute concerns.  See prior documentation.

## 2024-04-29 NOTE — Progress Notes (Signed)
 78 y.o. Dylan [name] is here for routine follow-up of resistant hypertension and of diarrhea.  Since our last visit in 02/2024 patient was evaluated in ED for acute CHF exacerbation last month-he recalls no precipitant (no increase in salt, missed medications, change in water intake).  Received IV Lasix  40 mg and his home dose was increased for 3 days after which he was instructed to resume his home regimen.  Did well thereafter.  No shortness of breath, chest tightness, palpitations, increased edema, or weight gain attributed to water (though he is trying to gain weight generally).  Saw nephrology yesterday for routine care, labs pending.    Feeling good, no concerns except for his sore left great toe. Got some new footwear which puts less pressure on the area, feels better.  Diarrhea is not as problematic.  Eating and drinking well, sleeping well, no new symptoms, feels the best he has in many months.  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), nocturnal 08/03/2021   Hx of subareolar gynecomastia in male due to spironolactone , resolved 04/09/2021   Secondary hyperparathyroidism of renal origin 01/25/2021   RBBB 11/23/2020   History of GI diverticular bleed 11/21/2020   History of BPH 09/14/2020   Healthcare maintenance 09/13/2020   Urge urinary incontinence 06/29/2020   Refractory obstruction  of nasal airway 04/27/2020   Aortic atherosclerosis 03/28/2020   Stage 3b chronic kidney disease (CKD) ([name]) - followed at [facility] 03/09/2020   Coronary artery disease without angina pectoris 04/04/2018   Thrombocytopenia 10/09/2016   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   Atherosclerosis 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 12/28/2014   Gout 10/19/2014   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) 10/18/2014   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) 10/18/2014   Resistant hypertension (hyperaldosteronism) 10/09/2014   OSA (obstructive sleep apnea) 10/08/2014   Former tobacco use 10/08/2014   History of colonic polyps (no further screening recommended per GI) 03/28/2014   Controlled type 2 diabetes mellitus with diabetic nephropathy, without long-term current use of insulin  ([name]) 10/08/1993    Current Outpatient Medications:    allopurinol  (ZYLOPRIM ) 100 MG tablet, Take 1 tablet (100 mg total) by mouth daily., Disp: 90 tablet, Rfl: 3   amLODipine  (NORVASC ) 10 MG tablet, Take 1 tablet (10 mg total) by mouth daily., Disp: 90 tablet, Rfl: 3   apixaban  (ELIQUIS ) 5 MG TABS tablet, Take 1 tablet (5 mg total) by mouth 2 (two) times daily., Disp: 180 tablet, Rfl: 3   atorvastatin  (LIPITOR) 40 MG tablet, Take 1 tablet (40 mg total) by mouth daily., Disp: 90 tablet, Rfl: 3   eplerenone  (INSPRA ) 50 MG tablet, Take 1 tablet (50 mg total) by mouth daily., Disp: 90 tablet, Rfl: 3   FREESTYLE LITE test strip, 1 each by Other route in the morning., Disp: 100 each, Rfl: 3   furosemide  (LASIX ) 40 MG tablet, Take 1 tablet (40 mg  total) by mouth daily., Disp: 90 tablet, Rfl: 3   hydrALAZINE  (APRESOLINE ) 100 MG tablet, Take 1 tablet (100 mg total) by mouth 3 (three) times daily., Disp: 270 tablet, Rfl: 3   latanoprost  (XALATAN ) 0.005 % ophthalmic solution, Place 1 drop into both eyes at bedtime., Disp: , Rfl:    potassium  chloride SA (KLOR-CON  M) 20 MEQ tablet, Take 1 tablet (20 mEq total) by mouth daily., Disp: 30 tablet, Rfl: 0  Functional Status: Independent in ADLs, assisted with IADLs. Family provides transportation to appts. Intermittent use of assistive device (he has done much better in recent months). No falls. He has very close family involvement and supervision. Hasn't been able to get out and walk as much as he would like, due to his chronic L great toenail pain.   Objective BP 130/86 (BP Location: Right Arm, Patient Position: Sitting, Cuff Size: Small)   Pulse 79   Temp 98.1 F (36.7 C) (Oral)   Ht 5' 6 (1.676 m)   Wt 156 lb 3.2 oz (70.9 kg)   SpO2 93%   BMI 25.21 kg/m   Exam:  Weight 156, stable over past 3 months with some fluctuation.  Cheerful demeanor, moves easily without assistive device.  Heart irregular irregular with what sounds like a split S2 (not new), no murmur.  Lungs clear throughout.  No carotid bruits.  No lower extremity edema.  Left great toe and toenail examined.  He does have some accumulated callus and subungual debris, no inflammation.  Nails trimmed well.  Skin turgor normal.  Assessment and Plan:   Controlled type 2 diabetes mellitus with diabetic nephropathy, without long-term current use of insulin  ([name]) Assessment & Plan: A1c 5.7.  Mr. Rijo had called in some weeks ago, not remembering that I'd instructed him to stop metformin , which he has subsequently done.  DM currently not requiring medication.  He worries when CBG's climb into high 100s on occasion, I reassured him that his overall control is very good.  Orders: -     POCT glycosylated hemoglobin (Hb A1C)  Resistant hypertension (hyperaldosteronism) Assessment & Plan: Took meds around 930 am today.  130/86 on current meds.  Good control!  No dizziness, tolerating meds w/o side effects.  130s systolics in afternoons at home.  No changes.    Encounter for immunization -     Flu vaccine HIGH DOSE PF(Fluzone  Trivalent)  Thrombocytopenia Assessment & Plan: Platelets 126 last month, gradually increasing since nadir in the 60s to 80s in 2023.  Will monitor annually and as needed.  Stage 3b chronic kidney disease (CKD) ([name]) - followed at [facility] Assessment & Plan: Appointment with [facility] was yesterday, labs drawn and pending.  A few readings over the past few months reveal GFR in low 40s.  I will review CK report when it arrives.  Renal lesion Assessment & Plan: Will discuss with Mr. Kriz follow-up with abdominal MRI in spring 2026.  Persistent atrial fibrillation ([name]) Assessment & Plan: Irregularly irregular today, asymptomatic, rate controlled without medication, tolerating anticoagulation without complications.  Monitor.  Pain around toenail Assessment & Plan: Discomfort continues when wearing shoes-he has purchased some open toe sandals (well-fitting, stable) which has greatly reduced discomfort.  Toe examined, no acute concerns.  See prior documentation.  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   Diarrhea, unspecified type Assessment & Plan: CT AP unrevealing for causes of chronic diarrhea.  No colitis.  Diarrhea has improved and he is beginning to gain back some weight. Will monitor.   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  Acute CHF exacerbation last month quickly resolved, no precipitant identified.  Continue current regimen.  Cirrhosis of liver without ascites, unspecified hepatic cirrhosis type [facility]) Assessment & Plan: Compensated. CT abd no liver lesions.   Pulmonary hypertension due to  sleep-disordered breathing ([name]) Assessment & Plan: No clinical findings of R heart failure (neck veins are flat; no LE edema).  Monitor.  Other orders -     Glucose, capillary

## 2024-04-29 NOTE — Assessment & Plan Note (Signed)
 A1c 5.7.  Dylan [name] had called in some weeks ago, not remembering that I'd instructed him to stop metformin , which he has subsequently done.  DM currently not requiring medication.  He worries when CBG's climb into high 100s on occasion, I reassured him that his overall control is very good.

## 2024-04-29 NOTE — Assessment & Plan Note (Signed)
 No clinical findings of R heart failure (neck veins are flat; no LE edema).  Monitor.

## 2024-04-29 NOTE — Assessment & Plan Note (Signed)
 Irregularly irregular today, asymptomatic, rate controlled without medication, tolerating anticoagulation without complications.  Monitor.

## 2024-04-29 NOTE — Assessment & Plan Note (Signed)
 Euvolemic on exam today.  Acute CHF exacerbation last month quickly resolved, no precipitant identified.  Continue current regimen.

## 2024-04-29 NOTE — Assessment & Plan Note (Signed)
 Platelets 126 last month, gradually increasing since nadir in the 60s to 80s in 2023.  Will monitor annually and as needed.

## 2024-04-29 NOTE — Assessment & Plan Note (Signed)
 Took meds around 930 am today.  130/86 on current meds.  Good control!  No dizziness, tolerating meds w/o side effects.  130s systolics in afternoons at home.  No changes.

## 2024-04-29 NOTE — Assessment & Plan Note (Signed)
 Split S2 audible today

## 2024-04-29 NOTE — Assessment & Plan Note (Signed)
 Tolerating apixaban  with no complications at this time.

## 2024-04-29 NOTE — Assessment & Plan Note (Signed)
 CT AP unrevealing for causes of chronic diarrhea.  No colitis.  Diarrhea has improved and he is beginning to gain back some weight. Will monitor.

## 2024-04-29 NOTE — Assessment & Plan Note (Signed)
 Will discuss with Mr. Imhoff follow-up with abdominal MRI in spring 2026.

## 2024-05-10 ENCOUNTER — Telehealth: Payer: Self-pay

## 2024-05-10 NOTE — Telephone Encounter (Signed)
 Copied from CRM [id number]. Topic: Clinical - Medical Advice >> May 10, 2024  2:28 PM Merlynn A wrote: Reason for CRM: Patient called in requesting callback from Dr. Trudy. Please contact patient at [phone].

## 2024-05-10 NOTE — Telephone Encounter (Signed)
 Return pt's call who stated he received a call from [facility] on Friday and was told his kidneys are functioning at 27% and to take Vitamin D. Stated when he saw the doctor on 10/23; he was told everything was ok. He would like a call from Dr Trudy.

## 2024-05-12 NOTE — Telephone Encounter (Signed)
 Called pt- informed Dr Trudy did not check his kidney function at his last appt. Stated he has a f/u appt at [facility] in 2 months and he asked them to send the lab result to Dr Trudy.

## 2024-05-25 NOTE — Progress Notes (Unsigned)
 Cardiology Office Note:  .   Date:  05/26/2024  ID:  Dylan [name], DOB May 05, 1946, MRN [id number] PCP: Trudy Mliss Dragon, MD  [facility] Providers Cardiologist:  Darryle ONEIDA Decent, MD }   History of Present Illness: .    Chief Complaint  Patient presents with   Follow-up    Dylan [name] is a 78 y.o. male with history of HCM, CHF, HTN, CAD, CKD 3b, DM, HLD, pAF who presents for follow-up.   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 He is accompanied by his granddaughter.  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 His last echocardiogram showed an ejection fraction of 70-75%. His current medications include amlodipine  10 mg daily, eplerenone  50 mg daily, hydralazine  100 mg three times a day, and Lasix  40 mg daily for fluid management.  He has a history of atrial fibrillation and is on Eliquis  5 mg twice daily. No bleeding or bruising while on this medication. He also has a history of nocturnal complete heart block.  He has chronic kidney disease stage 3B, with recent evaluation indicating kidney function at 27%.  He has a history of coronary artery disease managed medically and reports no symptoms of angina. He is on Lipitor 40 mg, with his last LDL check showing a level of 37.  He experiences difficulty breathing and has had episodes of pulmonary edema, for which he was seen in the emergency room in September. He is monitoring his salt intake to manage fluid retention. He experiences difficulty breathing, which he reports has impacted his ability to walk and exercise.  He has a history of hypertension and is currently on medication for blood pressure management.  He reports a sore and swollen big toe following a visit to a podiatrist six months ago,  which has impacted his ability to walk and exercise. The toe is currently looking better.  He has a history of psoriasis and cirrhosis, which are stable.          Problem List  Hypertrophic cardiomyopathy/Systolic HF -EF 50% echo 12/2020 -EF 25% on CMR (arrhythmia artifact?) 03/2021 -EF 30-35% 07/2021 -basal septum 21 mm -3% LGE -LVEF 70-75% 06/20/2023 2. CAD -45% mLAD -85% D2 (08/09/2021) -T chol 125, HDL 38, LDL 75, TG 57 3. Persistent Afib/flutter 4. CHB detected on monitor -nocturnal 02/18/2021 @ 4:02 AM 5. Diabetes -A1c 5.7 6. HTN -hyperaldosteronism? 7. CKD 3b 8. Prior tobacco abuse -20 pack years  9. Pulmonary hypertension 10. OSA 11. Cirrhosis     ROS: All other ROS reviewed and negative. Pertinent positives noted in the HPI.     Studies Reviewed: SABRA       EKG 03/13/2024 Afib 96 bpm, RBBB, no acute changes   TTE 06/20/2023  1. Left ventricular ejection fraction, by estimation, is 70 to 75%. The  left ventricle has hyperdynamic function. The left ventricle has no  regional wall motion abnormalities. There is severe left ventricular  hypertrophy. Left ventricular diastolic  parameters are indeterminate.   2. Right ventricular systolic function is moderately reduced. The right  ventricular size is severely enlarged. There is moderately elevated  pulmonary artery systolic pressure. The estimated right ventricular  systolic pressure is 50.6 mmHg.   3. Left atrial size was severely dilated.   4. Right atrial size was severely  dilated.   5. The mitral valve is grossly normal. Trivial mitral valve  regurgitation. No evidence of mitral stenosis.   6. The aortic valve is abnormal. There is moderate calcification of the  aortic valve. There is moderate thickening of the aortic valve. Aortic  valve regurgitation is not visualized. Aortic valve  sclerosis/calcification is present, without any evidence  of aortic stenosis.   7. The inferior vena cava is normal in size  with greater than 50%  respiratory variability, suggesting right atrial pressure of 3 mmHg.  Physical Exam:   VS:  BP 134/70   Pulse 89   Ht 5' 6 (1.676 m)   Wt 156 lb (70.8 kg)   SpO2 92%   BMI 25.18 kg/m    Wt Readings from Last 3 Encounters:  05/26/24 156 lb (70.8 kg)  04/29/24 156 lb 3.2 oz (70.9 kg)  03/18/24 149 lb 3.2 oz (67.7 kg)    GEN: Well nourished, well developed in no acute distress NECK: No JVD; No carotid bruits CARDIAC: RRR, no murmurs, rubs, gallops RESPIRATORY:  Clear to auscultation without rales, wheezing or rhonchi  ABDOMEN: Soft, non-tender, non-distended EXTREMITIES:  No edema; No deformity  ASSESSMENT AND PLAN: .   Assessment and Plan    Hypertrophic cardiomyopathy and systolic heart failure with recovered ejection fraction Hypertrophic cardiomyopathy with systolic heart failure and recovered ejection fraction. EF 70-75%. Stable fluid status. - Ordered echocardiogram to assess current heart function. - Continue amlodipine  10 mg daily, eplerenone  50 mg daily, hydralazine  100 mg TID, Lasix  50 mg daily.  Atrial fibrillation and history of nocturnal complete heart block Persistent atrial fibrillation with nocturnal complete heart block. Rate controlled. No bleeding on Eliquis . - Continue Eliquis  5 mg BID. - Avoid avian oval agents.  Coronary artery disease, medically managed Coronary artery disease managed medically. No angina. Last LDL 37 mg/dL. - Checked lipids and direct LDL today.  Hypertension BP 134/70 mmHg. - Continue current antihypertensive medications.  Chronic kidney disease stage 3b Progression to 27% kidney function. Under nephrology care. - Continue vitamin D supplementation as advised by nephrology.  Pulmonary hypertension Noted.  History of pulmonary edema Previous episodes requiring ER visits. - Continue Lasix  50 mg daily. - Monitor salt intake.              Follow-up: Return in about 1 year (around  05/26/2025).  Signed, Darryle DASEN. Barbaraann, MD, [facility]  [facility]  [address] [place], [place] [address] [phone]  1:39 PM

## 2024-05-26 ENCOUNTER — Encounter: Payer: Self-pay | Admitting: Cardiovascular Disease

## 2024-05-26 ENCOUNTER — Ambulatory Visit: Attending: Cardiovascular Disease | Admitting: Cardiovascular Disease

## 2024-05-26 VITALS — BP 134/70 | HR 89 | Ht 66.0 in | Wt 156.0 lb

## 2024-05-26 DIAGNOSIS — I5022 Chronic systolic (congestive) heart failure: Secondary | ICD-10-CM | POA: Diagnosis not present

## 2024-05-26 DIAGNOSIS — I421 Obstructive hypertrophic cardiomyopathy: Secondary | ICD-10-CM

## 2024-05-26 DIAGNOSIS — N1832 Chronic kidney disease, stage 3b: Secondary | ICD-10-CM

## 2024-05-26 DIAGNOSIS — I4811 Longstanding persistent atrial fibrillation: Secondary | ICD-10-CM

## 2024-05-26 DIAGNOSIS — I1A0 Resistant hypertension: Secondary | ICD-10-CM

## 2024-05-26 DIAGNOSIS — G478 Other sleep disorders: Secondary | ICD-10-CM

## 2024-05-26 DIAGNOSIS — G4733 Obstructive sleep apnea (adult) (pediatric): Secondary | ICD-10-CM

## 2024-05-26 DIAGNOSIS — I2729 Other secondary pulmonary hypertension: Secondary | ICD-10-CM

## 2024-05-26 DIAGNOSIS — I2510 Atherosclerotic heart disease of native coronary artery without angina pectoris: Secondary | ICD-10-CM

## 2024-05-26 DIAGNOSIS — I422 Other hypertrophic cardiomyopathy: Secondary | ICD-10-CM | POA: Diagnosis not present

## 2024-05-26 LAB — LDL CHOLESTEROL, DIRECT

## 2024-05-26 NOTE — Patient Instructions (Signed)
 Medication Instructions:  Your physician recommends that you continue on your current medications as directed. Please refer to the Current Medication list given to you today.  *If you need a refill on your cardiac medications before your next appointment, please call your pharmacy*  Lab Work: Lipids LDL  lab work   Testing/Procedures: Echo Your physician has requested that you have an echocardiogram. Echocardiography is a painless test that uses sound waves to create images of your heart. It provides your doctor with information about the size and shape of your heart and how well your heart's chambers and valves are working. This procedure takes approximately one hour. There are no restrictions for this procedure. Please do NOT wear cologne, perfume, aftershave, or lotions (deodorant is allowed). Please arrive 15 minutes prior to your appointment time.  Please note: We ask at that you not bring children with you during ultrasound (echo/ vascular) testing. Due to room size and safety concerns, children are not allowed in the ultrasound rooms during exams. Our front office staff cannot provide observation of children in our lobby area while testing is being conducted. An adult accompanying a patient to their appointment will only be allowed in the ultrasound room at the discretion of the ultrasound technician under special circumstances. We apologize for any inconvenience.   Follow-Up: At [facility], you and your health needs are our priority.  As part of our continuing mission to provide you with exceptional heart care, our providers are all part of one team.  This team includes your primary Cardiologist (physician) and Advanced Practice Providers or APPs (Physician Assistants and Nurse Practitioners) who all work together to provide you with the care you need, when you need it.  Your next appointment:    As needed  Provider:   Darryle ONEIDA Decent, MD    We recommend signing up for  the patient portal called MyChart.  Sign up information is provided on this After Visit Summary.  MyChart is used to connect with patients for Virtual Visits (Telemedicine).  Patients are able to view lab/test results, encounter notes, upcoming appointments, etc.  Non-urgent messages can be sent to your provider as well.   To learn more about what you can do with MyChart, go to [email or web address].

## 2024-05-27 ENCOUNTER — Ambulatory Visit: Payer: Self-pay | Admitting: Cardiovascular Disease

## 2024-05-27 LAB — LIPID PANEL
Chol/HDL Ratio: 2.2 ratio (ref 0.0–5.0)
Cholesterol, Total: 112 mg/dL (ref 100–199)
HDL: 52 mg/dL (ref >39)
LDL Chol Calc (NIH): 48 mg/dL (ref 0–99)
Triglycerides: 47 mg/dL (ref 0–149)
VLDL Cholesterol Cal: 12 mg/dL (ref 5–40)

## 2024-05-27 LAB — LDL CHOLESTEROL, DIRECT: LDL Direct: 52 mg/dL (ref 0–99)

## 2024-05-28 ENCOUNTER — Other Ambulatory Visit: Payer: Self-pay

## 2024-05-28 MED ORDER — FUROSEMIDE 40 MG PO TABS: 40.0000 mg | ORAL_TABLET | Freq: Every day | ORAL | 3 refills | Status: AC

## 2024-05-28 NOTE — Telephone Encounter (Signed)
 Medication sent to pharmacy

## 2024-06-30 ENCOUNTER — Ambulatory Visit (HOSPITAL_COMMUNITY)
Admission: RE | Admit: 2024-06-30 | Discharge: 2024-06-30 | Disposition: A | Discharge status: Home / Self Care | Source: Ambulatory Visit | Attending: Cardiovascular Disease | Admitting: Cardiovascular Disease

## 2024-06-30 DIAGNOSIS — I5022 Chronic systolic (congestive) heart failure: Secondary | ICD-10-CM | POA: Diagnosis present

## 2024-06-30 DIAGNOSIS — I421 Obstructive hypertrophic cardiomyopathy: Secondary | ICD-10-CM | POA: Insufficient documentation

## 2024-07-01 LAB — ECHOCARDIOGRAM COMPLETE: S' Lateral: 2.61 cm

## 2024-07-05 ENCOUNTER — Other Ambulatory Visit: Payer: Self-pay | Admitting: Internal Medicine

## 2024-07-05 DIAGNOSIS — I5022 Chronic systolic (congestive) heart failure: Secondary | ICD-10-CM

## 2024-07-05 DIAGNOSIS — I1A0 Resistant hypertension: Secondary | ICD-10-CM

## 2024-07-05 MED ORDER — ALLOPURINOL 100 MG PO TABS: 100.0000 mg | ORAL_TABLET | Freq: Every day | ORAL | 1 refills | Status: AC

## 2024-07-05 MED ORDER — EPLERENONE 50 MG PO TABS: 50.0000 mg | ORAL_TABLET | Freq: Every day | ORAL | 0 refills | Status: DC

## 2024-07-05 NOTE — Telephone Encounter (Unsigned)
 Copied from CRM [id number]. Topic: Clinical - Medication Refill >> Jul 05, 2024 10:59 AM Dylan [name] wrote: Medication: eplerenone  (INSPRA ) 50 MG tablet allopurinol  (ZYLOPRIM ) 100 MG tablet   Has the patient contacted their pharmacy? Yes, they said the office needs to be contacted  (Agent: If no, request that the patient contact the pharmacy for the refill. If patient does not wish to contact the pharmacy document the reason why and proceed with request.) (Agent: If yes, when and what did the pharmacy advise?)  This is the patient's preferred pharmacy:  [facility] [address], [place] - [address] [place] [address] Phone: [phone] Fax: [phone]  Is this the correct pharmacy for this prescription? Yes  If no, delete pharmacy and type the correct one.   Has the prescription been filled recently? Yes   Is the patient out of the medication? Yes   Has the patient been seen for an appointment in the last year OR does the patient have an upcoming appointment? Yes   Can we respond through MyChart? No   Agent: Please be advised that Rx refills may take up to 3 business days. We ask that you follow-up with your pharmacy.

## 2024-07-29 ENCOUNTER — Encounter (HOSPITAL_COMMUNITY): Payer: Self-pay

## 2024-07-29 ENCOUNTER — Observation Stay (HOSPITAL_COMMUNITY)
Admission: EM | Admit: 2024-07-29 | Discharge: 2024-07-30 | Disposition: A | Discharge status: Home / Self Care | Attending: Emergency Medicine | Admitting: Emergency Medicine

## 2024-07-29 ENCOUNTER — Emergency Department (HOSPITAL_COMMUNITY)

## 2024-07-29 ENCOUNTER — Other Ambulatory Visit: Payer: Self-pay

## 2024-07-29 DIAGNOSIS — Z79899 Other long term (current) drug therapy: Secondary | ICD-10-CM | POA: Insufficient documentation

## 2024-07-29 DIAGNOSIS — E1122 Type 2 diabetes mellitus with diabetic chronic kidney disease: Secondary | ICD-10-CM | POA: Insufficient documentation

## 2024-07-29 DIAGNOSIS — I509 Heart failure, unspecified: Principal | ICD-10-CM

## 2024-07-29 DIAGNOSIS — N1832 Chronic kidney disease, stage 3b: Secondary | ICD-10-CM | POA: Insufficient documentation

## 2024-07-29 DIAGNOSIS — E1121 Type 2 diabetes mellitus with diabetic nephropathy: Secondary | ICD-10-CM | POA: Diagnosis not present

## 2024-07-29 DIAGNOSIS — I2510 Atherosclerotic heart disease of native coronary artery without angina pectoris: Secondary | ICD-10-CM | POA: Diagnosis not present

## 2024-07-29 DIAGNOSIS — I130 Hypertensive heart and chronic kidney disease with heart failure and stage 1 through stage 4 chronic kidney disease, or unspecified chronic kidney disease: Secondary | ICD-10-CM | POA: Insufficient documentation

## 2024-07-29 DIAGNOSIS — I5033 Acute on chronic diastolic (congestive) heart failure: Principal | ICD-10-CM | POA: Insufficient documentation

## 2024-07-29 DIAGNOSIS — I421 Obstructive hypertrophic cardiomyopathy: Secondary | ICD-10-CM | POA: Insufficient documentation

## 2024-07-29 DIAGNOSIS — G4733 Obstructive sleep apnea (adult) (pediatric): Secondary | ICD-10-CM | POA: Diagnosis not present

## 2024-07-29 DIAGNOSIS — M109 Gout, unspecified: Secondary | ICD-10-CM | POA: Diagnosis not present

## 2024-07-29 DIAGNOSIS — I272 Pulmonary hypertension, unspecified: Secondary | ICD-10-CM | POA: Diagnosis not present

## 2024-07-29 DIAGNOSIS — E785 Hyperlipidemia, unspecified: Secondary | ICD-10-CM | POA: Diagnosis not present

## 2024-07-29 DIAGNOSIS — I1A0 Resistant hypertension: Secondary | ICD-10-CM | POA: Diagnosis present

## 2024-07-29 DIAGNOSIS — Z8679 Personal history of other diseases of the circulatory system: Secondary | ICD-10-CM | POA: Diagnosis not present

## 2024-07-29 DIAGNOSIS — Z87891 Personal history of nicotine dependence: Secondary | ICD-10-CM | POA: Diagnosis not present

## 2024-07-29 DIAGNOSIS — R0602 Shortness of breath: Secondary | ICD-10-CM | POA: Diagnosis present

## 2024-07-29 DIAGNOSIS — I4819 Other persistent atrial fibrillation: Secondary | ICD-10-CM | POA: Diagnosis not present

## 2024-07-29 DIAGNOSIS — N62 Hypertrophy of breast: Secondary | ICD-10-CM | POA: Diagnosis present

## 2024-07-29 LAB — CBC WITH DIFFERENTIAL/PLATELET
Abs Immature Granulocytes: 0.04 K/uL (ref 0.00–0.07)
Basophils Absolute: 0.1 K/uL (ref 0.0–0.1)
Basophils Relative: 1 %
Eosinophils Absolute: 0.1 K/uL (ref 0.0–0.5)
Eosinophils Relative: 1 %
HCT: 43.3 % (ref 39.0–52.0)
Hemoglobin: 14.1 g/dL (ref 13.0–17.0)
Immature Granulocytes: 0 %
Lymphocytes Relative: 11 %
Lymphs Abs: 1.2 K/uL (ref 0.7–4.0)
MCH: 31.5 pg (ref 26.0–34.0)
MCHC: 32.6 g/dL (ref 30.0–36.0)
MCV: 96.7 fL (ref 80.0–100.0)
Monocytes Absolute: 2.5 K/uL — ABNORMAL HIGH (ref 0.1–1.0)
Monocytes Relative: 23 %
Neutro Abs: 7.2 K/uL (ref 1.7–7.7)
Neutrophils Relative %: 64 %
Platelets: 140 K/uL — ABNORMAL LOW (ref 150–400)
RBC: 4.48 MIL/uL (ref 4.22–5.81)
RDW: 13.6 % (ref 11.5–15.5)
WBC: 11.3 K/uL — ABNORMAL HIGH (ref 4.0–10.5)
nRBC: 0 % (ref 0.0–0.2)

## 2024-07-29 LAB — BASIC METABOLIC PANEL WITH GFR
Anion gap: 15 (ref 5–15)
BUN: 56 mg/dL — ABNORMAL HIGH (ref 8–23)
CO2: 18 mmol/L — ABNORMAL LOW (ref 22–32)
Calcium: 9.9 mg/dL (ref 8.9–10.3)
Chloride: 107 mmol/L (ref 98–111)
Creatinine, Ser: 2.36 mg/dL — ABNORMAL HIGH (ref 0.61–1.24)
GFR, Estimated: 27 mL/min — ABNORMAL LOW
Glucose, Bld: 180 mg/dL — ABNORMAL HIGH (ref 70–99)
Potassium: 3.9 mmol/L (ref 3.5–5.1)
Sodium: 139 mmol/L (ref 135–145)

## 2024-07-29 LAB — TROPONIN T, HIGH SENSITIVITY
Troponin T High Sensitivity: 71 ng/L — ABNORMAL HIGH (ref 0–19)
Troponin T High Sensitivity: 68 ng/L — ABNORMAL HIGH (ref 0–19)

## 2024-07-29 LAB — PRO BRAIN NATRIURETIC PEPTIDE: Pro Brain Natriuretic Peptide: 5690 pg/mL — ABNORMAL HIGH

## 2024-07-29 MED ORDER — FUROSEMIDE 8 MG/ML PO SOLN: 40.0000 mg | Freq: Once | ORAL | Status: DC

## 2024-07-29 MED ORDER — FUROSEMIDE 10 MG/ML IJ SOLN
40.0000 mg | INTRAMUSCULAR | Status: AC
  Administered 2024-07-29: 40 mg via INTRAVENOUS
  Filled 2024-07-29: qty 4

## 2024-07-29 NOTE — H&P (Addendum)
 " Date: 07/29/2024               Patient Name:  Dylan [name] MRN: [id number]  DOB: 1945-12-08 Age / Sex: 79 y.o., male   PCP: Trudy Mliss Dragon, MD         Medical Service: Internal Medicine Teaching Service         Attending Physician: Dr. Mliss Trudy      First Contact: Dr. Alfornia Light, DO    Second Contact: Dr. Missy Sandhoff, MD         Pager Information: First Contact Pager: [phone]   Second Contact Pager: [phone]   SUBJECTIVE   Chief Complaint: Shortness of breath  History of Present Illness: Dylan [name] is a 79 y.o. 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 He presents with progressive shortness of breath for the past 4-5 days. He reports associated sneezing and coughing without sputum production. He has been unable to lie flat and has been sleeping in a recliner. He endorses bilateral lower extremity swelling and mild abdominal distention. He denies fever, chills, chest pain, abdominal pain, nausea, vomiting, or headache. He denies dysuria or hematuria but notes increased urinary frequency and reports a history of enlarged prostate. Appetite is intact, eating 3-4 meals daily, and he has been attempting to avoid sodium. He denies recent sick contacts. He reports a dry weight of approximately 150 lbs.  ED Course: Labs significant for proBNP 5,690, WBC 11.3, BUN 56, creatinine 2.36. Chest X-ray without acute cardiopulmonary process. Received IV furosemide  40 mg x1 in ED. IMTS consult  Meds:  Patient reported: manages meds himself -Allopurinol  100 mg  Y -Eliquis  5 mg twice daily Y (last dose 1/22 AM) -Amlodipine  10 mg Y -Hydralazine  100 mg 3 times daily Y -Eplerenone  50 mg Y -Lasix  40 mg daily Y (last dose 1/22 AM) -Potassium 20 mEq N -Atorvastatin  40  mg Y -Latanoprost  eyedrops both eyes nightly Y  Past Medical History Heart failure with improved ejection fraction Persistent atrial fibrillation Pulmonary hypertension Resistant hypertension (hyperaldosteronism) Chronic kidney disease stage 3b Obstructive sleep apnea Type 2 diabetes mellitus, diet controlled Hyperlipidemia Gout Benign prostatic hyperplasia  Past Surgical History Past Surgical History:  Procedure Laterality Date   BIOPSY  10/10/2021   Procedure: BIOPSY;  Surgeon: Rollin Dover, MD;  Location: [facility] ENDOSCOPY;  Service: Gastroenterology;;   CHOLECYSTECTOMY     COLONOSCOPY WITH PROPOFOL  N/A 02/12/2019   Procedure: COLONOSCOPY WITH PROPOFOL ;  Surgeon: Rollin Dover, MD;  Location: [facility] ENDOSCOPY;  Service: Endoscopy;  Laterality: N/A;   ESOPHAGOGASTRODUODENOSCOPY (EGD) WITH PROPOFOL  N/A 10/10/2021   Procedure: ESOPHAGOGASTRODUODENOSCOPY (EGD) WITH PROPOFOL ;  Surgeon: Rollin Dover, MD;  Location[facility] END[facility]COPY;  Service: Gastroenterology;  Laterality: N/A;   PERCUTANEOUS PINNING Left 10/21/2019   Procedure: CLOSED REDUCTION WITH PERCUTANEOUS PINNING AND SUTURE REPAIR OF LACERATION  OF SMALL FINGER,  SUTURE REPAIR OF LACERATION OF INDEX FINGER;  Surgeon: Elisabeth Craig RAMAN, MD;  Location: [facility] OR;  Service: Plastics;  Laterality: Left;   RIGHT/LEFT HEART CATH AND CORONARY ANGIOGRAPHY N/A 08/09/2021   Procedure: RIGHT/LEFT HEART CATH AND CORONARY ANGIOGRAPHY;  Surgeon: Burnard Debby LABOR, MD;  Location: [facility] INVASIVE CV LAB;  Service: Cardiovascular;  Laterality: N/A;   Social:  Lives: alone in [place]  Support: family here and in [place] Level of Function: independent with ADL, no assistive devices PCP: Trudy Mliss  Arlean, MD  Substances: -Tobacco: former smoker (quit 40 years ago) -Alcohol: denies -Recreational Drug: denies  Family History:  Family History  Problem Relation Age of Onset   Heart failure Sister        Died of complications 05/30/2016  CAD Sister        CABG x3 while in  her 26's     Allergies: Allergies as of 07/29/2024 - Review Complete 07/29/2024  Allergen Reaction Noted   Ace inhibitors Other (See Comments) 10/03/2017   Spironolactone  Other (See Comments) 10/19/2014    Review of Systems: A complete ROS was negative except as per HPI.   OBJECTIVE:   Physical Exam: Blood pressure (!) 151/91, pulse 60, temperature 98.3 F (36.8 C), temperature source Oral, resp. rate 18, height 5' 6 (1.676 m), weight 68 kg, SpO2 94% on Room Air.  Constitutional: well-appearing elderly male sitting upright in bed, in no acute distress HENT: normocephalic, atraumatic; mucous membranes moist Neck: supple, no appreciable JVD Cardiovascular: Irregular rhythm, regular rate, systolic murmur in left lower sternal Pulmonary/Chest: normal work of breathing on room air; mild right basilar crackles, otherwise clear to auscultation Abdominal: bowel sounds present, soft, non-tender, non-distended MSK: trace to no pitting LE edema w/ palpable DP pulses bilaterally Neurological: alert and oriented 3 Psych: calm, cooperative, appropriate mood and affect  Labs: CBC    Component Value Date/Time   WBC 11.3 (H) 07/29/2024 1618   RBC 4.48 07/29/2024 1618   HGB 14.1 07/29/2024 1618   HGB 13.9 09/12/2022 1129   HCT 43.3 07/29/2024 1618   HCT 42.4 09/12/2022 1129   PLT 140 (L) 07/29/2024 1618   PLT 136 (L) 09/12/2022 1129   MCV 96.7 07/29/2024 1618   MCV 96 09/12/2022 1129   MCH 31.5 07/29/2024 1618   MCHC 32.6 07/29/2024 1618   RDW 13.6 07/29/2024 1618   RDW 11.8 09/12/2022 1129   LYMPHSABS 1.2 07/29/2024 1618   MONOABS 2.5 (H) 07/29/2024 1618   EOSABS 0.1 07/29/2024 1618   BASOSABS 0.1 07/29/2024 1618     CMP     Component Value Date/Time   NA 139 07/29/2024 1618   NA 140 02/19/2024 1145   K 3.9 07/29/2024 1618   CL 107 07/29/2024 1618   CO2 18 (L) 07/29/2024 1618   GLUCOSE 180 (H) 07/29/2024 1618   BUN 56 (H) 07/29/2024 1618   BUN 41 (H) 02/19/2024 1145    CREATININE 2.36 (H) 07/29/2024 1618   CREATININE 1.81 (H) 02/14/2021 1155   CREATININE 1.59 (H) 01/12/2015 1032   CALCIUM  9.9 07/29/2024 1618   PROT 8.5 02/19/2024 1145   ALBUMIN 5.0 (H) 02/19/2024 1145   AST 13 02/19/2024 1145   AST 12 (L) 02/14/2021 1155   ALT 10 02/19/2024 1145   ALT 12 02/14/2021 1155   ALKPHOS 62 02/19/2024 1145   BILITOT 0.7 02/19/2024 1145   BILITOT 0.4 02/14/2021 1155   GFRNONAA 27 (L) 07/29/2024 1618   GFRNONAA 38 (L) 02/14/2021 1155   GFRNONAA 43 (L) 01/12/2015 1032   GFRAA 36 (L) 06/29/2020 1030   GFRAA 50 (L) 01/12/2015 1032    Imaging: DG Chest 2 View Result Date: 07/29/2024 EXAM: 2 VIEW(S) XRAY OF THE CHEST 07/29/2024 04:46:16 PM COMPARISON: 03/13/2024 CLINICAL HISTORY: Shortness of breath. FINDINGS: LUNGS AND PLEURA: No focal pulmonary opacity. No pleural effusion. No pneumothorax. HEART AND MEDIASTINUM: Mild cardiomegaly, stable. Atherosclerotic calcification of aortic arch. BONES AND SOFT TISSUES: No acute osseous abnormality. IMPRESSION: 1. No acute findings. 2. Mild  cardiomegaly, stable. Electronically signed by: Greig Pique MD 07/29/2024 04:51 PM EST RP Workstation: [id number]     EKG: pAfib with regular QRS, consistent with known complete heart block. No ST-segment elevation or acute ischemia.   ASSESSMENT & PLAN:   Assessment & Plan by Problem: Principal Problem:   Acute on chronic heart failure ([name]) Active Problems:   Controlled type 2 diabetes mellitus with diabetic nephropathy, without long-term current use of insulin  ([name])   Resistant hypertension (hyperaldosteronism)   Gout   Stage 3b chronic kidney disease (CKD) ([name]) - followed at [facility]   Hx of subareolar gynecomastia in male due to spironolactone , resolved   Hyperlipidemia  Ruger Brewer Hitchman is an 79 year old man with complex cardiovascular disease who presented with progressive dyspnea, orthopnea, and peripheral edema, admitted for suspected acute on chronic heart  failure exacerbation with associated AKI.  #HFimEF (EF 60-65%)/ symptoms without overt congestion The patient presents with progressive dyspnea and orthopnea with chronically elevated proBNP (5,690). Chest X-ray is without pulmonary edema. Exam notable only for mild right basilar crackles without JVD. Bedside POCUS demonstrates fairly collapsible IVC, suggesting no significant intravascular volume overload. Overall presentation is less consistent with a classic wet HF exacerbation and more suggestive of HF symptoms driven by pulmonary hypertension and RV dysfunction rather than true volume excess. Plan: - Hold further scheduled diuresis (s/p IV Lasix  40 mg 1 in ED) - Replete lytes if further diuresis K>4 and Mg >2 - Strict I/Os and daily weights - Cardiac monitoring - Maintain 2 g sodium diet - Repeat echocardiogram ordered - Monitor symptoms and renal function closely - PT/OT eval  #Pulmonary Hypertension (likely WHO Group 3 due to OSA) with RV dysfunction Known severe pulmonary hypertension with prior echocardiographic evidence of RV enlargement and severely reduced RV systolic function. Current presentation may reflect RV-predominant failure physiology, which can explain dyspnea and peripheral edema despite a collapsible IVC and lack of pulmonary congestion. Plan: - Avoid hypotension and excessive preload reduction - maintain SpO2>92% - Avoid aggressive diuresis; reassess volume status daily - Repeat TTE to reassess RV function and pulmonary pressures  #Hx of Complete Heart Block (nocturnal) History of intermittent nocturnal complete heart block. Evaluated by EP; patient declined permanent pacemaker/ICD after shared decision-making. Currently asymptomatic. Plan: - Telemetry monitoring - Avoid BB,CCB, Digoxin  #CKD3b Creatinine 2.36 near baseline. No evidence of obstruction or intrinsic renal process at this time. Plan: - Trend renal function  - Avoid nephrotoxins  - Adjust  diuretics and Eliquis  based on renal trends - Strict intake and output monitoring  #Persistent Atrial Fibrillation / Hypercoagulable State Chronic atrial fibrillation, currently rate controlled. On long-term anticoagulation. No signs of acute ischemia or rapid ventricular response. Plan: - Continue apixaban  2.5 mg BID (renal and age adjusted) - Telemetry monitoring - Maintain electrolyte balance  #Resistant Hypertension (Hyperaldosteronism) History of resistant hypertension managed with multi-agent therapy. Blood pressures mildly elevated on admission without signs of hypertensive emergency. Intolerant to SGLT2 and no ACEI/ARB/ARNI per previous notes.  Plan: - Continue amlodipine  10 mg daily - Continue hydralazine  100 mg TID - Eplerenone  not available on formulary - Spironolactone  contraindicated due to prior adverse effects (gynecomastia) - Monitor blood pressure closely and reassess regimen as needed  #OSA / Refractory Nasal Obstruction Known OSA may contribute to nocturnal dyspnea and pulmonary hypertension. He has hx of refractory nasal obstruction. Has seen an ENT in the past who recommended surgery, but he was hesitant based on his comorbidities and potential risks.  He would not  do well with a CPAP mask as consequence.  Plan: - Can not use CPAP due to nasal obstruction  - Maintain head-of-bed elevation  #T2DM (Diet Controlled) Well-controlled diabetes with recent A1c 5.7. No acute glycemic issues. Plan: - Morning CBG monitoring - No insulin  at this time  #Hyperlipidemia Chronic condition, stable. Plan: - Continue atorvastatin  40 mg daily  #Gout History of gout, no acute flare. Plan: - Continue allopurinol  100 mg daily  #Goals of Care / Code Status Goals of care were reviewed. The patient has expressed clear wishes for DNR and DNI. In the event of pre-arrest conditions, he prefers full medical management with non-invasive interventions but wishes to avoid intubation,  CPR. Documentation is consistent with prior discussions and advanced directives. Plan: - Honor DNR/DNI status  Best practice: Diet: 2 g Na VTE: DOAC IVF: None Code: DNR/DNI  Disposition planning: Prior to Admission Living Arrangement: Home, living alone Anticipated Discharge Location: Home  Dispo: Admit patient to Observation with expected length of stay less than 2 midnights.  Signed: Bernadine Manos, MD Internal Medicine Resident  07/29/2024, 11:02 PM  On Call pager: [phone]   "

## 2024-07-29 NOTE — ED Provider Triage Note (Signed)
 Emergency Medicine Provider Triage Evaluation Note  Pj Zehner , a 79 y.o. male  was evaluated in triage.  Pt complains of increased dyspnea over the last week, also endorses orthopnea over the intervening time.  States that he does have some lower extremity edema, but denies having any weight gain and weighs himself approximately 3 times a week, no noted weight gain over the last week.  Denies any productive cough.  Review of Systems  Positive: As above Negative:   Physical Exam  BP 138/78   Pulse 97   Temp 98.1 F (36.7 C)   Resp 18   Ht 5' 6 (1.676 m)   Wt 68 kg   SpO2 91%   BMI 24.21 kg/m  Gen:   Awake, no distress   Resp:  Normal effort  MSK:   Moves extremities without difficulty  Other:    Medical Decision Making  Medically screening exam initiated at 4:18 PM.  Appropriate orders placed.  Luismiguel Dwan Hemmelgarn was informed that the remainder of the evaluation will be completed by another provider, this initial triage assessment does not replace that evaluation, and the importance of remaining in the ED until their evaluation is complete.  Initial dyspnea labs and imaging obtained.   Myriam Dorn [place], [place] 07/29/24 [id number]

## 2024-07-29 NOTE — ED Provider Notes (Signed)
 " [place] EMERGENCY DEPARTMENT AT [facility] Provider Note   CSN: [id number] Arrival date & time: 07/29/24  1533     Patient presents with: Shortness of Breath   Dylan [name] is a 79 y.o. male.   The history is provided by the patient and medical records.  Shortness of Breath  79 year old male with history of coronary artery disease, CHF, diabetes, anemia, gout, chronic kidney disease, sleep apnea, presenting to the ED with shortness of breath.  Has been ongoing for about 5 days or so, seems to be worsening each day.  Symptoms are worse when trying to lie flat so he has been sleeping in a chair in the living room for the past week.  Also has noticed some swelling around his ankles but no significant weight gain.  States he weighs himself 3 times a week, weight has been steady at 150.  He has been compliant with his medications including his diuretics.  States he still has a fair amount of urine.  Denies any fever or chills.  Denies any chest pain.  Prior to Admission medications  Medication Sig Start Date End Date Taking? Authorizing Provider  allopurinol  (ZYLOPRIM ) 100 MG tablet Take 1 tablet (100 mg total) by mouth daily. 07/05/24   Trudy Mliss Dragon, MD  amLODipine  (NORVASC ) 10 MG tablet Take 1 tablet (10 mg total) by mouth daily. 11/24/23   Trudy Mliss Dragon, MD  apixaban  (ELIQUIS ) 5 MG TABS tablet Take 1 tablet (5 mg total) by mouth 2 (two) times daily. 02/04/24   Trudy Mliss Dragon, MD  atorvastatin  (LIPITOR) 40 MG tablet Take 1 tablet (40 mg total) by mouth daily. 03/01/24   Trudy Mliss Dragon, MD  eplerenone  (INSPRA ) 50 MG tablet Take 1 tablet (50 mg total) by mouth daily. 07/05/24   Trudy Mliss Dragon, MD  FREESTYLE LITE test strip 1 each by Other route in the morning. 11/11/22   Trudy Mliss Dragon, MD  furosemide  (LASIX ) 40 MG tablet Take 1 tablet (40 mg total) by mouth daily. 05/28/24 05/23/25  Trudy Mliss Dragon, MD  hydrALAZINE  (APRESOLINE ) 100 MG  tablet Take 1 tablet (100 mg total) by mouth 3 (three) times daily. 11/12/23   Trudy Mliss Dragon, MD  latanoprost  (XALATAN ) 0.005 % ophthalmic solution Place 1 drop into both eyes at bedtime. 01/12/21   [provider]  potassium chloride  SA (KLOR-CON  M) 20 MEQ tablet Take 1 tablet (20 mEq total) by mouth daily. 03/13/24   Dean Mliss, MD    Allergies: Ace inhibitors and Spironolactone     Review of Systems  Respiratory:  Positive for shortness of breath.   All other systems reviewed and are negative.   Updated Vital Signs BP (!) 151/91 (BP Location: Right Arm)   Pulse 60   Temp 98.3 F (36.8 C) (Oral)   Resp 18   Ht 5' 6 (1.676 m)   Wt 68 kg   SpO2 94%   BMI 24.21 kg/m   Physical Exam Vitals and nursing note reviewed.  Constitutional:      Appearance: He is well-developed.  HENT:     Head: Normocephalic and atraumatic.  Eyes:     Conjunctiva/sclera: Conjunctivae normal.     Pupils: Pupils are equal, round, and reactive to light.  Cardiovascular:     Rate and Rhythm: Normal rate and regular rhythm.     Heart sounds: Normal heart sounds.  Pulmonary:     Effort: Pulmonary effort is normal.     Breath sounds:  Normal breath sounds.     Comments: No overt rales but does seem to get a but winded during fluid conversation, no acute distress Abdominal:     General: Bowel sounds are normal.     Palpations: Abdomen is soft.  Musculoskeletal:        General: Normal range of motion.     Cervical back: Normal range of motion.     Comments: Trace edema around the ankles, no significant pitting noted  Skin:    General: Skin is warm and dry.  Neurological:     Mental Status: He is alert and oriented to person, place, and time.     (all labs ordered are listed, but only abnormal results are displayed) Labs Reviewed  BASIC METABOLIC PANEL WITH GFR - Abnormal; Notable for the following components:      Result Value   CO2 18 (*)    Glucose, Bld 180 (*)    BUN 56 (*)     Creatinine, Ser 2.36 (*)    GFR, Estimated 27 (*)    All other components within normal limits  CBC WITH DIFFERENTIAL/PLATELET - Abnormal; Notable for the following components:   WBC 11.3 (*)    Platelets 140 (*)    Monocytes Absolute 2.5 (*)    All other components within normal limits  PRO BRAIN NATRIURETIC PEPTIDE - Abnormal; Notable for the following components:   Pro Brain Natriuretic Peptide 5,690.0 (*)    All other components within normal limits  TROPONIN T, HIGH SENSITIVITY - Abnormal; Notable for the following components:   Troponin T High Sensitivity 71 (*)    All other components within normal limits  TROPONIN T, HIGH SENSITIVITY - Abnormal; Notable for the following components:   Troponin T High Sensitivity 68 (*)    All other components within normal limits    EKG: None  Radiology: DG Chest 2 View Result Date: 07/29/2024 EXAM: 2 VIEW(S) XRAY OF THE CHEST 07/29/2024 04:46:16 PM COMPARISON: 03/13/2024 CLINICAL HISTORY: Shortness of breath. FINDINGS: LUNGS AND PLEURA: No focal pulmonary opacity. No pleural effusion. No pneumothorax. HEART AND MEDIASTINUM: Mild cardiomegaly, stable. Atherosclerotic calcification of aortic arch. BONES AND SOFT TISSUES: No acute osseous abnormality. IMPRESSION: 1. No acute findings. 2. Mild cardiomegaly, stable. Electronically signed by: Greig Pique MD 07/29/2024 04:51 PM EST RP Workstation: [id number]     Procedures   Medications Ordered in the ED  furosemide  (LASIX ) injection 40 mg (40 mg Intravenous Given 07/29/24 2312)                                    Medical Decision Making Amount and/or Complexity of Data Reviewed Labs: ordered. Radiology: ordered and independent interpretation performed. ECG/medicine tests: ordered and independent interpretation performed.  Risk Prescription drug management. Decision regarding hospitalization.   79 year old male presenting to the ED with shortness of breath over the past 5 days.   Does have nighttime orthopnea, has been sleeping in his recliner for the past week.  Denies any weight gain.  Has been compliant with his home Lasix .  He is afebrile and nontoxic in appearance here.  Does seem a bit winded just with conversation.  Has some trace edema around the ankles but no significant pitting.  EKG with A-fib which is chronic.  Labs as above, does have a mild leukocytosis, renal function appears at baseline.  Troponins 71 and 68 respectively, flat.  Not having active chest pain,  suspect this is likely some demand ischemia from his CHF.  BNP 5690.  Chest x-ray with cardiomegaly but no overt edema.  Feel he will need admission for diuresis.  He is given dose of 40 mg IV Lasix .  Discussed with IMTS-- they will admit for ongoing care.  Final diagnoses:  Acute on chronic congestive heart failure, unspecified heart failure type [facility])    ED Discharge Orders     None          Jarold Olam HERO, PA-C 07/29/24 2318    Lenor Hollering, MD 07/29/24 2320  "

## 2024-07-29 NOTE — ED Triage Notes (Signed)
 Pt arrived POV from home c/o SHOB x1 week. Pt states he is unable to lay flat without feeling like he can't breathe. Pt denies any pain but states he has no energy.

## 2024-07-29 NOTE — Hospital Course (Addendum)
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 CXR did not show any signs of acute abnormalities including pulmonary edema and exam without JVD or rales. Overall presentation is less consistent with a classic wet HF exacerbation and more suggestive of HF symptoms driven by pulmonary hypertension and RV dysfunction rather than true volume excess. He received a total of 2 doses of IV Lasix  40 mg during his admission with appropriate urine output and no electrolyte derangements. Dry weight of about 155 lbs and presented with 160 lbs.  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 Current presentation may reflect RV-predominant failure physiology, which can explain dyspnea and peripheral edema despite a collapsible IVC and lack of pulmonary congestion. No inspiratory rales were auscultated on lung exam. Signs point to RV dysfunction as cause of heart failure. Did not repeat TTE given rapid improvement and recently completed one last month.   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 Evaluated by EP; patient declined permanent pacemaker/ICD after shared decision-making. Currently asymptomatic. No reported episodes of bradycardia during admission. Per outpatient cardiology, avoid AV nodal agents in this patient.  #CKD3b Creatinine 2.36 near baseline on admission. On day of discharge further downtrended to 2.1. No evidence of obstruction or intrinsic renal process at this time.   #Persistent Atrial Fibrillation / Hypercoagulable State Chronic atrial fibrillation, currently rate controlled. On long-term anticoagulation with Eliquis . Dose was changed to 2.5 mg bid due to age and renal function. No signs of  acute ischemia or rapid ventricular response.   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 Blood pressures elevated to 150-160s/80-90s on admission without signs of hypertensive emergency. Continued home medications with the exception of eplerenone  since it is not on hospital formulary. Pressures tolerated both doses of IV diuresis.  #OSA / Refractory Nasal Obstruction Known OSA but does not tolerate CPAP due to refractory nasal obstruction. Patient has seen ENT with [facility] (Dr. Llewellyn) in 2024 who recommended surgery but patient declined due to concern for prior comorbidities. OSA is likely main contributor to pulmonary hypertension.  #T2DM (Diet Controlled) Well-controlled diabetes with recent A1c 5.7% managed with lifestyle changes. No acute concerns.  #Hyperlipidemia Continued atorvastatin  40 mg daily.  #Gout No acute concerns during diuresis. Continued allopurinol  100 mg daily.   #Goals of Care / Code Status DNR/DNI per patient wishes.

## 2024-07-30 ENCOUNTER — Observation Stay (HOSPITAL_COMMUNITY)

## 2024-07-30 ENCOUNTER — Other Ambulatory Visit (HOSPITAL_COMMUNITY): Payer: Self-pay

## 2024-07-30 DIAGNOSIS — I509 Heart failure, unspecified: Secondary | ICD-10-CM

## 2024-07-30 LAB — CBC
HCT: 42.4 % (ref 39.0–52.0)
Hemoglobin: 13.9 g/dL (ref 13.0–17.0)
MCH: 31.4 pg (ref 26.0–34.0)
MCHC: 32.8 g/dL (ref 30.0–36.0)
MCV: 95.7 fL (ref 80.0–100.0)
Platelets: 141 K/uL — ABNORMAL LOW (ref 150–400)
RBC: 4.43 MIL/uL (ref 4.22–5.81)
RDW: 13.6 % (ref 11.5–15.5)
WBC: 9 K/uL (ref 4.0–10.5)
nRBC: 0.2 % (ref 0.0–0.2)

## 2024-07-30 LAB — COMPREHENSIVE METABOLIC PANEL WITH GFR
ALT: 15 U/L (ref 0–44)
AST: 42 U/L — ABNORMAL HIGH (ref 15–41)
Albumin: 4.5 g/dL (ref 3.5–5.0)
Alkaline Phosphatase: 78 U/L (ref 38–126)
Anion gap: 16 — ABNORMAL HIGH (ref 5–15)
BUN: 54 mg/dL — ABNORMAL HIGH (ref 8–23)
CO2: 20 mmol/L — ABNORMAL LOW (ref 22–32)
Calcium: 9.9 mg/dL (ref 8.9–10.3)
Chloride: 107 mmol/L (ref 98–111)
Creatinine, Ser: 2.18 mg/dL — ABNORMAL HIGH (ref 0.61–1.24)
GFR, Estimated: 30 mL/min — ABNORMAL LOW
Glucose, Bld: 128 mg/dL — ABNORMAL HIGH (ref 70–99)
Potassium: 3.8 mmol/L (ref 3.5–5.1)
Sodium: 142 mmol/L (ref 135–145)
Total Bilirubin: 0.8 mg/dL (ref 0.0–1.2)
Total Protein: 8.5 g/dL — ABNORMAL HIGH (ref 6.5–8.1)

## 2024-07-30 LAB — MAGNESIUM: Magnesium: 2.3 mg/dL (ref 1.7–2.4)

## 2024-07-30 LAB — CBG MONITORING, ED: Glucose-Capillary: 111 mg/dL — ABNORMAL HIGH (ref 70–99)

## 2024-07-30 LAB — RESP PANEL BY RT-PCR (RSV, FLU A&B, COVID)  RVPGX2
Influenza A by PCR: NEGATIVE
Influenza B by PCR: NEGATIVE
Resp Syncytial Virus by PCR: NEGATIVE
SARS Coronavirus 2 by RT PCR: NEGATIVE

## 2024-07-30 MED ORDER — HYDRALAZINE HCL 50 MG PO TABS
100.0000 mg | ORAL_TABLET | Freq: Three times a day (TID) | ORAL | Status: DC
  Administered 2024-07-30: 100 mg via ORAL
  Filled 2024-07-30: qty 2

## 2024-07-30 MED ORDER — FUROSEMIDE 10 MG/ML IJ SOLN
40.0000 mg | INTRAMUSCULAR | Status: AC
  Administered 2024-07-30: 40 mg via INTRAVENOUS
  Filled 2024-07-30: qty 4

## 2024-07-30 MED ORDER — ALLOPURINOL 100 MG PO TABS
100.0000 mg | ORAL_TABLET | Freq: Every day | ORAL | Status: DC
  Administered 2024-07-30: 100 mg via ORAL
  Filled 2024-07-30: qty 1

## 2024-07-30 MED ORDER — APIXABAN 2.5 MG PO TABS
2.5000 mg | ORAL_TABLET | Freq: Two times a day (BID) | ORAL | 0 refills | Status: DC
  Filled 2024-07-30: qty 60, 30d supply, fill #0

## 2024-07-30 MED ORDER — ACETAMINOPHEN 325 MG PO TABS: 650.0000 mg | ORAL_TABLET | Freq: Four times a day (QID) | ORAL | Status: DC | PRN

## 2024-07-30 MED ORDER — POLYETHYLENE GLYCOL 3350 17 G PO PACK: 17.0000 g | PACK | Freq: Every day | ORAL | Status: DC | PRN

## 2024-07-30 MED ORDER — LATANOPROST 0.005 % OP SOLN
1.0000 [drp] | Freq: Every day | OPHTHALMIC | Status: DC
  Filled 2024-07-30: qty 2.5

## 2024-07-30 MED ORDER — POTASSIUM CHLORIDE CRYS ER 20 MEQ PO TBCR
20.0000 meq | EXTENDED_RELEASE_TABLET | Freq: Once | ORAL | Status: AC
  Administered 2024-07-30: 20 meq via ORAL
  Filled 2024-07-30: qty 1

## 2024-07-30 MED ORDER — APIXABAN 2.5 MG PO TABS
2.5000 mg | ORAL_TABLET | Freq: Two times a day (BID) | ORAL | Status: DC
  Administered 2024-07-30: 2.5 mg via ORAL
  Filled 2024-07-30: qty 1

## 2024-07-30 MED ORDER — ACETAMINOPHEN 650 MG RE SUPP: 650.0000 mg | Freq: Four times a day (QID) | RECTAL | Status: DC | PRN

## 2024-07-30 MED ORDER — AMLODIPINE BESYLATE 5 MG PO TABS
10.0000 mg | ORAL_TABLET | Freq: Every day | ORAL | Status: DC
  Administered 2024-07-30: 10 mg via ORAL
  Filled 2024-07-30: qty 2

## 2024-07-30 MED ORDER — ATORVASTATIN CALCIUM 40 MG PO TABS
40.0000 mg | ORAL_TABLET | Freq: Every day | ORAL | Status: DC
  Administered 2024-07-30: 40 mg via ORAL
  Filled 2024-07-30: qty 1

## 2024-07-30 NOTE — Progress Notes (Signed)
 OT Cancellation Note  Patient Details Name: Dylan [name] MRN: [id number] DOB: 1946/04/11   Cancelled Treatment:    Reason Eval/Treat Not Completed: OT screened, no needs identified, will sign off  Bayne Fosnaugh,HILLARY 07/30/2024, 12:10 PM Kreg Sink, OT/L   Acute OT Clinical Specialist Acute Rehabilitation Services Pager [phone] Office [phone]

## 2024-07-30 NOTE — ED Notes (Signed)
 Pts. Belonging transfer with pt. On back of bed.

## 2024-07-30 NOTE — Discharge Summary (Signed)
 "  Name: Dylan [name] MRN: [id number] DOB: February 22, 1946 79 y.o. PCP: Trudy Mliss Dragon, MD  Date of Admission: 07/29/2024  3:41 PM Date of Discharge: 07/30/2024  Attending Physician: Dr. Mliss Trudy  Discharge Diagnosis: Principal Problem:   Acute on chronic heart failure [facility]) Active Problems:   Controlled type 2 diabetes mellitus with diabetic nephropathy, without long-term current use of insulin  ([name])   Resistant hypertension (hyperaldosteronism)   Gout   Stage 3b chronic kidney disease (CKD) ([name]) - followed at [facility]   Hx of subareolar gynecomastia in male due to spironolactone , resolved   Hyperlipidemia   Discharge Medications: Allergies as of 07/30/2024       Reactions   Ace Inhibitors Other (See Comments)   ARF - see CRF overview   Spironolactone  Other (See Comments)   Gynecomastia per pt report        Medication List     TAKE these medications    allopurinol  100 MG tablet Commonly known as: ZYLOPRIM  Take 1 tablet (100 mg total) by mouth daily.   amLODipine  10 MG tablet Commonly known as: NORVASC  Take 1 tablet (10 mg total) by mouth daily.   apixaban  2.5 MG Tabs tablet Commonly known as: ELIQUIS  Take 1 tablet (2.5 mg total) by mouth 2 (two) times daily. What changed:  medication strength how much to take   atorvastatin  40 MG tablet Commonly known as: LIPITOR Take 1 tablet (40 mg total) by mouth daily.   Cholecalciferol 50 MCG (2000 UT) Caps Take 2,000 Units by mouth in the morning.   dorzolamide-timolol 2-0.5 % ophthalmic solution Commonly known as: COSOPT Place 1 drop into both eyes 2 (two) times daily.   eplerenone  50 MG tablet Commonly known as: INSPRA  Take 1 tablet (50 mg total) by mouth daily.   furosemide  40 MG tablet Commonly known as: Lasix  Take 1 tablet (40 mg total) by mouth daily.   hydrALAZINE  100 MG tablet Commonly known as: APRESOLINE  Take 1 tablet (100 mg total) by mouth 3 (three) times daily.    latanoprost  0.005 % ophthalmic solution Commonly known as: XALATAN  Place 1 drop into both eyes at bedtime.   vitamin E 180 MG (400 UNITS) capsule Take 400 Units by mouth daily.        Disposition and follow-up:   [name] was discharged from [facility] in Good condition.  At the hospital follow up visit please address:  HFimEF exacerbation/Pulm HTN - assess volume status and if any changes needed to his diuretics regimen. Permanent Afib - his Eliquis  dosing was changed from 5 mg bid to 2.5 mg bid due to age and kidney function. Please ensure he is taking the correct dosage. Cough/Congestion - patient instructed to try Coricidin HBP for symptom management. Please assess current symptoms.  Labs / imaging needed at time of follow-up: None  Pending labs/ test needing follow-up: None   Follow-up Appointments:  Follow-up Information     Tawkaliyar, Roya, DO Follow up on 08/06/2024.   Specialty: Internal Medicine Why: Appointment time: 10:45 AM Contact information: [address], [address] [place] [address] [phone]                 Hospital Course by problem list: #HFimEF (EF 60-65%) Exacerbation The patient presented with progressive dyspnea and orthopnea with chronically elevated proBNP (5,690). CXR did not show any signs of acute abnormalities including pulmonary edema and exam without JVD or rales. Overall presentation is less consistent with a classic  wet HF exacerbation and more suggestive of HF symptoms driven by pulmonary hypertension and RV dysfunction rather than true volume excess. He received a total of 2 doses of IV Lasix  40 mg during his admission with appropriate urine output and no electrolyte derangements. Dry weight of about 155 lbs and presented with 160 lbs.  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 Current presentation may reflect RV-predominant failure physiology, which can explain dyspnea and peripheral edema despite a collapsible IVC and lack of pulmonary congestion. No inspiratory rales were auscultated on lung exam. Signs point to RV dysfunction as cause of heart failure. Did not repeat TTE given rapid improvement and recently completed one last month.   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 Evaluated by EP; patient declined permanent pacemaker/ICD after shared decision-making. Currently asymptomatic. No reported episodes of bradycardia during admission. Per outpatient cardiology, avoid AV nodal agents in this patient.  #CKD3b Creatinine 2.36 near baseline on admission. On day of discharge further downtrended to 2.1. No evidence of obstruction or intrinsic renal process at this time.   #Persistent Atrial Fibrillation / Hypercoagulable State Chronic atrial fibrillation, currently rate controlled. On long-term anticoagulation with Eliquis . Dose was changed to 2.5 mg bid due to age and renal function. No signs of acute ischemia or rapid ventricular response.   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 Blood pressures elevated to 150-160s/80-90s on admission without signs of hypertensive emergency. Continued home medications with the exception of eplerenone  since it is not on hospital formulary. Pressures tolerated both doses of IV diuresis.  #OSA / Refractory Nasal Obstruction Known OSA but does not tolerate CPAP due to refractory nasal obstruction. Patient has seen ENT with [facility] (Dr. Llewellyn) in 2024 who recommended surgery but patient declined due to concern for prior comorbidities. OSA is likely main contributor to pulmonary hypertension.  #T2DM (Diet  Controlled) Well-controlled diabetes with recent A1c 5.7% managed with lifestyle changes. No acute concerns.  #Hyperlipidemia Continued atorvastatin  40 mg daily.  #Gout No acute concerns during diuresis. Continued allopurinol  100 mg daily.   #Goals of Care / Code Status DNR/DNI per patient wishes.   Discharge Subjective: [name] reports feeling a bit better than he did yesterday night.  He does not think that he could lie completely flat without feeling extremely short of breath.  However, there is no current chest pain or shortness of breath at rest.  He does note some sneezing, coughing, and runny nose but does not think he was sick before this hospitalization.  Before he came to the hospital, he had an eye injection at his eye doctor on Wednesday.  Does not recall missing any doses of his medication.  He has been drinking the same amount of water as normal about 3 bottles per day.  Does not note any recent foods that would be high in sodium.  He does endorse some neck pain with movement, though this is not new to him.  He had an appointment with his cardiologist last month and was told that his echocardiogram was normal. Patient is medically ready for discharge.  Discharge Exam:   BP 131/78   Pulse 63   Temp 98.6 F (37 C)   Resp (!) 27   Ht 5' 6 (1.676 m)   Wt  72.9 kg   SpO2 95%   BMI 25.94 kg/m  Physical Exam: Constitutional: well-appearing elderly man  in no acute distress HENT: Patent nostrils with mild audible congestion Eyes: Left subconjunctival hemorrhage; proptosis Cardiovascular: Irregularly irregular rhythm with normal rate, splitting of S2; 2+ right radial pulse vs. 1+ left radial pulse; no audible subclavian bruit Pulmonary/Chest: normal work of breathing on room air, lungs CTAB without any rales, rhonchi, or wheezes Abdominal: soft, non-tender, non-distended, bowel sounds normal Neurological: alert & oriented x3, moving extremities equally Skin:  decreased skin turgor on arms and neck Extremities: Trace BLE pitting edema, dusky discoloration of fingernails, hands cool to the touch Psych: normal mood and affect, thought content normal   Pertinent Labs, Studies, and Procedures:     Latest Ref Rng & Units 07/30/2024    5:10 AM 07/29/2024    4:18 PM 03/13/2024   11:10 AM  CBC  WBC 4.0 - 10.5 K/uL 9.0  11.3    Hemoglobin 13.0 - 17.0 g/dL 86.0  85.8  87.0   Hematocrit 39.0 - 52.0 % 42.4  43.3  38.0   Platelets 150 - 400 K/uL 141  140         Latest Ref Rng & Units 07/30/2024    5:10 AM 07/29/2024    4:18 PM 03/13/2024   11:10 AM  CMP  Glucose 70 - 99 mg/dL 871  819  885   BUN 8 - 23 mg/dL 54  56  40   Creatinine 0.61 - 1.24 mg/dL 7.81  7.63  7.59   Sodium 135 - 145 mmol/L 142  139  141   Potassium 3.5 - 5.1 mmol/L 3.8  3.9  3.2   Chloride 98 - 111 mmol/L 107  107  107   CO2 22 - 32 mmol/L 20  18    Calcium  8.9 - 10.3 mg/dL 9.9  9.9    Total Protein 6.5 - 8.1 g/dL 8.5     Total Bilirubin 0.0 - 1.2 mg/dL 0.8     Alkaline Phos 38 - 126 U/L 78     AST 15 - 41 U/L 42     ALT 0 - 44 U/L 15       DG Chest 2 View Result Date: 07/29/2024 EXAM: 2 VIEW(S) XRAY OF THE CHEST 07/29/2024 04:46:16 PM COMPARISON: 03/13/2024 CLINICAL HISTORY: Shortness of breath. FINDINGS: LUNGS AND PLEURA: No focal pulmonary opacity. No pleural effusion. No pneumothorax. HEART AND MEDIASTINUM: Mild cardiomegaly, stable. Atherosclerotic calcification of aortic arch. BONES AND SOFT TISSUES: No acute osseous abnormality. IMPRESSION: 1. No acute findings. 2. Mild cardiomegaly, stable. Electronically signed by: Greig Pique MD 07/29/2024 04:51 PM EST RP Workstation: [id number]      Discharge Instructions      To Dylan [name] or their caretakers,  You were recently admitted to [facility] for shortness of breath thought to be due to an exacerbation of your heart failure and increased pressures in the vessels surrounding your heart and lungs.  Your  condition was treated with 2 doses of intravenous Lasix  (furosemide ) which is also a medicine that you take at home by mouth. For your cough/congestion symptoms, please try taking Coricidin HBP which you can buy over the counter at the pharmacy.  Continue taking your home medications with the following changes:  Eliquis  dose was changed from 5 mg twice daily to 2.5 mg twice daily due to your age and kidney function. Please discard your old 5 mg Eliquis .  You should seek  further medical care if you notice worsening shortness of breath, chest pain, increased leg swelling, or weight gain of 5 lbs within 1 week.  We have also scheduled you a follow up appointment at the [facility] with Dr. Toma Edwards on 08/06/24 at 10:15 AM.  We are so glad that you are feeling better.  Sincerely,  [facility]      Signed:  Letha Cheadle, MD Internal Medicine Resident, PGY-1 07/30/2024, 11:53 AM Please contact the on call pager after 5 pm and on weekends at [phone].  "

## 2024-07-30 NOTE — ED Notes (Addendum)
 CCMD called to notify that pt had a run of Vtach lasting 11 beats. Provider paged awaiting return call to notify as well. Pt sleeping denies any chest pain or discomfort. States he didn't feel the change in rhythm. EKG captured.

## 2024-07-30 NOTE — ED Notes (Signed)
 Attending provider notified of pt's rhythm change. No new orders at this time.

## 2024-07-30 NOTE — Discharge Instructions (Addendum)
 To Dylan [name] or their caretakers,  You were recently admitted to [facility] for shortness of breath thought to be due to an exacerbation of your heart failure and increased pressures in the vessels surrounding your heart and lungs.  Your condition was treated with 2 doses of intravenous Lasix  (furosemide ) which is also a medicine that you take at home by mouth. For your cough/congestion symptoms, please try taking Coricidin HBP which you can buy over the counter at the pharmacy.  Continue taking your home medications with the following changes:  Eliquis  dose was changed from 5 mg twice daily to 2.5 mg twice daily due to your age and kidney function. Please discard your old 5 mg Eliquis .  You should seek further medical care if you notice worsening shortness of breath, chest pain, increased leg swelling, or weight gain of 5 lbs within 1 week.  We have also scheduled you a follow up appointment at the [facility] with Dr. Toma Edwards on 08/06/24 at 10:15 AM.  We are so glad that you are feeling better.  Sincerely,  [facility]

## 2024-07-30 NOTE — Progress Notes (Signed)
 Heart Failure Navigator Progress Note  Assessed for Heart & Vascular TOC clinic readiness.  Patient does not meet criteria due to the overall presentation is less consistent with a classic wet HF exacerbation and more suggestive of HF symptoms driven by pulmonary hypertension and RV dysfunction rather than true volume excess per MD.   Navigator available for reassessment of patient.   Duwaine Plant, PharmD, BCPS Heart Failure Stewardship Pharmacist Phone [phone]

## 2024-07-30 NOTE — ED Notes (Signed)
 Ambulated down the hall pt went from room 24 to room 43 and back to room 24. Pts heart rate went from 40 to 94 during the walk.His o2 went down to 88 back to 97 during the same walk.

## 2024-07-30 NOTE — Progress Notes (Signed)
 PT Cancellation Note  Patient Details Name: Nickey Kloepfer MRN: [id number] DOB: 04-07-1946   Cancelled Treatment:    Reason Eval/Treat Not Completed: PT screened, no needs identified, will sign off (per RN pt independent, walking and not in need of therapy at this time, will screen)   Chanel Mcadams B Iness Pangilinan 07/30/2024, 11:48 AM Lenoard SQUIBB, PT Acute Rehabilitation Services Office: [phone]

## 2024-08-02 ENCOUNTER — Telehealth: Payer: Self-pay

## 2024-08-02 NOTE — Transitions of Care (Post Inpatient/ED Visit) (Unsigned)
" ° °  08/02/2024  Name: Dylan [name] MRN: [id number] DOB: [birth year]  Today's TOC FU Call Status: Today's TOC FU Call Status:: Unsuccessful Call (1st Attempt) Unsuccessful Call (1st Attempt) Date: 08/02/24  Attempted to reach the patient regarding the most recent Inpatient/ED visit.  Follow Up Plan: Additional outreach attempts will be made to reach the patient to complete the Transitions of Care (Post Inpatient/ED visit) call.   Signature  Charmaine Bloodgood, LPN [facility] Health Advisor [facility] You Are. We Are. [facility] Direct Dial [phone]  "

## 2024-08-06 ENCOUNTER — Ambulatory Visit: Payer: Self-pay | Admitting: Student

## 2024-08-09 ENCOUNTER — Ambulatory Visit

## 2024-08-12 ENCOUNTER — Ambulatory Visit

## 2024-08-12 ENCOUNTER — Other Ambulatory Visit: Payer: Self-pay

## 2024-08-12 VITALS — BP 136/76 | HR 80 | Temp 98.1°F | Ht 66.0 in | Wt 149.8 lb

## 2024-08-12 DIAGNOSIS — I1A0 Resistant hypertension: Secondary | ICD-10-CM

## 2024-08-12 DIAGNOSIS — E1121 Type 2 diabetes mellitus with diabetic nephropathy: Secondary | ICD-10-CM

## 2024-08-12 DIAGNOSIS — I421 Obstructive hypertrophic cardiomyopathy: Secondary | ICD-10-CM

## 2024-08-12 DIAGNOSIS — I4819 Other persistent atrial fibrillation: Secondary | ICD-10-CM

## 2024-08-12 LAB — POCT GLYCOSYLATED HEMOGLOBIN (HGB A1C): HbA1c, POC (controlled diabetic range): 6.4 % (ref 0.0–7.0)

## 2024-08-12 LAB — GLUCOSE, CAPILLARY: Glucose-Capillary: 124 mg/dL — ABNORMAL HIGH (ref 70–99)

## 2024-08-12 MED ORDER — APIXABAN 2.5 MG PO TABS
2.5000 mg | ORAL_TABLET | Freq: Two times a day (BID) | ORAL | 3 refills | Status: AC
  Filled 2024-08-12: qty 180, 90d supply, fill #0

## 2024-08-12 MED ORDER — APIXABAN 2.5 MG PO TABS: 2.5000 mg | ORAL_TABLET | Freq: Two times a day (BID) | ORAL | 3 refills | Status: DC

## 2024-08-12 MED ORDER — EPLERENONE 50 MG PO TABS
50.0000 mg | ORAL_TABLET | Freq: Every day | ORAL | 0 refills | Status: AC
  Filled 2024-08-12: qty 90, 90d supply, fill #0

## 2024-08-12 MED ORDER — EPLERENONE 50 MG PO TABS: 50.0000 mg | ORAL_TABLET | Freq: Every day | ORAL | 0 refills | Status: DC

## 2024-08-12 NOTE — Progress Notes (Signed)
 "  Established Patient Office Visit  Subjective   Patient ID: Dylan [name], male    DOB: 01/05/1946  Age: 79 y.o. MRN: [id number]  HPI Follow up for recent discharge.  Admitted for progressive dyspnea and orthopnea in the setting of pulmonary hypertension with RV dysfunction.  The patient was last seen for this 2 weeks ago. Changes made during hospitalization include decrease apixaban  dose to 2.5 mg as recommended by his age and kidney disease.    He reports excellent compliance with treatment. He feels that condition is Improved. He is having side effects.   His daughter is with him.  He has no complaints other than having to clear his throat often and feeling like he has to get up mucus.  -----------------------------------------------------------------------------------------  Past Medical History:  Diagnosis Date   Anemia 10/18/2014   Baseline about 12 and stable from 2010 to 2016. Colon 2009 in [place] (records cannot be obtained).  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 Grade 2. EF 50-55%; echo repeated 2019, severe hypertrophy with elevated filling pressures   Chronic kidney disease, stage 3b ([name]) 03/09/2020   Dr. 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 Grade 2. EF 50-55%; echo repeated 2019, severe hypertrophy with elevated filling pressures  Repeat ECHO 08/04/21 showed EF 30-35%, severely elevated PASP, severely reduced RVSF, severely dilated LA and RA,  small pericardial effusion     Controlled type 2 diabetes mellitus with diabetic nephropathy, without long-term current use of insulin  ([name]) 10/08/1993   Had surgery on eyes for glaucoma ~1999     Excessive/dangerous weight loss with Trulicity  over summer 2022.      Coronary artery disease without angina pectoris    Degloving injury of finger 03/28/2020   10/21/19: copied from op note 1.  Open reduction percutaneous pinning left small finger proximal phalanx fracture 2.  Complex repair of laceration to left small finger 3 cm in length 3.  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 No significant callus formation is noted. Pins had been removed   Diverticulosis 10/08/2014   Seen on CT. Reportedly on Colon in [place] in 2009. Freq bouts of diverticulitis.   Dizziness due to orthostatic hypotensioni in setting of wt loss, resolved 01/12/2021   DM (diabetes mellitus), type 2 with renal complications ([name]) 10/09/2014   Former tobacco use 10/08/2014   Gout    H/O gastrointestinal diverticular hemorrhage 11/21/2020   History of colonic polyps (no further screening recommended per GI) 03/28/2014   Dr. Rollin, 2 polyps removed 2015.  No polyps on f/u in 2020.  No further surveillance suggested per Dr. Rollin.  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 Stable from 2010 to 2016.  Negative SPEP and UPEP 2014 after ARF 2/2 continued ACE (lisinopril 20) use while vol contracted. 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 Failed CPAP on study. BiPA inspiratory pressure of 21 and expiratory pressure of 17 CWP. Consider ENT evaluation for potentially correctable upper airway obstruction contributing to the need for unusually high pressures - ENT July 2015 did not feel any intervention surgically was indicated. He wore a large F&P Simplus fullfac   Personal history of colonic polyps 03/28/2014   Dr. Rollin, 2 polyps removed 2015.  No polyps on f/u in 2020.  No further surveillance suggested per Dr. Rollin.   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  He is unable to breathe through his nose comfortably.  This is part of the reason why he does not wear his oxygen regularly.  Nasal passages are nearly completely obstructed erythematous smooth glistening surface.  He has been told by his eye doctor that part of the reason his e   Resistant hypertension 10/09/2014   Poor control with 6 drug therapy. 2016 : Aldo 37 but ARR 23.5    Severe pulmonary arterial systolic hypertension ([name]) 10/18/2014   Noted as severe ECHO 2016 and 2019. Likely 2/2 severe untreated OSA. Pt is not adherent to CPAP.   Stage 3b chronic kidney disease (CKD) ([name]) 03/09/2020   Dr. Douglass  Nephrologist, f/u Q 73M     Syncope 12/31/2020   Thrombocytopenia 10/09/2016   Nl liver and spleen on US  2019   Ulcer aphthous oral 04/27/2020   Evaluated emergency room last week, saw with Magic mouthwash   Unintentional weight loss due to Trulicity , resolved 11/28/2020   Past Surgical History:  Procedure Laterality Date   BIOPSY  10/10/2021   Procedure: BIOPSY;  Surgeon: Rollin Dover, MD;  Location: [facility] ENDOSCOPY;  Service: Gastroenterology;;   CHOLECYSTECTOMY     COLONOSCOPY WITH PROPOFOL  N/A 02/12/2019   Procedure: COLONOSCOPY WITH PROPOFOL ;  Surgeon: Rollin Dover, MD;  Location: [facility]  ENDOSCOPY;  Service: Endoscopy;  Laterality: N/A;   ESOPHAGOGASTRODUODENOSCOPY (EGD) WITH PROPOFOL  N/A 10/10/2021   Procedure: ESOPHAGOGASTRODUODENOSCOPY (EGD) WITH PROPOFOL ;  Surgeon: Rollin Dover, MD;  Location[facility] ENDOSCOPY;  Service: Gastroenterology;  Laterality: N/A;   PERCUTANEOUS PINNING Left 10/21/2019   Procedure: CLOSED REDUCTION WITH PERCUTANEOUS PINNING AND SUTURE REPAIR OF LACERATION  OF SMALL FINGER,  SUTURE REPAIR OF LACERATION OF INDEX FINGER;  Surgeon: Elisabeth Craig RAMAN, MD;  Location: [facility] OR;  Service: Plastics;  Laterality: Left;   RIGHT/LEFT HEART CATH AND CORONARY ANGIOGRAPHY N/A 08/09/2021   Procedure: RIGHT/LEFT HEART CATH AND CORONARY ANGIOGRAPHY;  Surgeon: Burnard Debby LABOR, MD;  Location: [facility] INVASIVE CV LAB;  Service: Cardiovascular;  Laterality: N/A;   Social History[1]      Objective:     BP 136/76 (BP Location: Left Arm, Patient Position: Sitting, Cuff Size: Normal)   Pulse 80   Temp 98.1 F (36.7 C) (Oral)   Ht 5' 6 (1.676 m)   Wt 149 lb 12.8 oz (67.9 kg)   SpO2 99%   BMI 24.18 kg/m  BP Readings from Last 3 Encounters:  08/12/24 136/76  07/30/24 135/86  05/26/24 134/70   Wt Readings from Last 3 Encounters:  08/12/24 149 lb 12.8 oz (67.9 kg)  07/29/24 160 lb 11.5 oz (72.9 kg)  05/26/24 156 lb (70.8 kg)      Physical Exam Vitals reviewed.  Constitutional:      Appearance: Normal appearance.  Cardiovascular:     Rate and Rhythm: Normal rate.     Pulses: Normal pulses.     Heart sounds: Normal heart sounds.  Pulmonary:     Effort: Pulmonary effort is normal.     Breath sounds: Normal breath sounds.  Musculoskeletal:        General: No swelling. Normal range of motion.     Right lower leg: No edema.     Left lower leg: No edema.  Skin:    General: Skin is warm.  Neurological:     General: No focal deficit present.     Mental Status: He is alert and oriented to person, place, and time.  Psychiatric:        Mood and Affect: Mood normal.         Behavior: Behavior normal.      Results for orders placed or performed in visit on 08/12/24  Glucose, capillary  Result Value Ref Range   Glucose-Capillary 124 (H) 70 - 99 mg/dL  POC Hbg J8R  Result Value Ref Range   Hemoglobin A1C     HbA1c POC (<> result, manual entry)     HbA1c, POC (prediabetic range)     HbA1c, POC (controlled diabetic range) 6.4 0.0 - 7.0 %    Last CBC Lab Results  Component Value Date   WBC 9.0 07/30/2024   HGB 13.9 07/30/2024   HCT 42.4 07/30/2024   MCV 95.7 07/30/2024   MCH 31.4 07/30/2024   RDW 13.6 07/30/2024   PLT 141 (L) 07/30/2024   Last metabolic panel Lab Results  Component Value Date   GLUCOSE 128 (H) 07/30/2024   NA 142 07/30/2024   K 3.8 07/30/2024   CL 107 07/30/2024   CO2 20 (L) 07/30/2024   BUN 54 (H) 07/30/2024   CREATININE 2.18 (H) 07/30/2024   GFRNONAA 30 (L) 07/30/2024   CALCIUM  9.9 07/30/2024   PHOS 3.3 12/17/2018   PROT 8.5 (H) 07/30/2024   ALBUMIN 4.5 07/30/2024   LABGLOB 3.5 02/19/2024   AGRATIO 1.6 06/20/2022   BILITOT 0.8 07/30/2024   ALKPHOS 78 07/30/2024   AST 42 (H) 07/30/2024   ALT 15 07/30/2024   ANIONGAP 16 (H) 07/30/2024   Last hemoglobin A1c Lab Results  Component Value Date   HGBA1C 6.4 08/12/2024      The ASCVD Risk score (Arnett DK, et al., 2019) failed to calculate for the following reasons:   The 2019 ASCVD risk score is only valid for ages 78 to 73   * - Cholesterol units were assumed    Assessment & Plan:   Assessment & Plan Controlled type 2 diabetes mellitus with diabetic nephropathy, without long-term current use of insulin  ([name]) Currently managed only with lifestyle modifications.  A1c 6.4 today, this is up from 5.7 3 months ago.  He is still within the prediabetic range.  Recommended to stop metformin  several months ago.  Will call patient of results and encourage diet and exercise.  Consider adding back metformin  if A1c continues to increase. 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  Currently on amlodipine  10, Lasix  40 and hydralazine  100 mg 3 times daily.  He was admitted for progressive dyspnea and orthopnea thought to be due to severe pulmonary hypertension with right ventricle enlargement, WHO group 3 due to OSA.  Today he is euvolemic and breathing comfortably on room air.  The only thing that bothers him is a persistent cough with phlegm.  He was recommended to take Coricidin on hospital discharge for his cough and says that it helped.  I recommended that he continue this over-the-counter medication and also try Mucinex  if it is still a problem. Plan Continue current regimen Orders:   eplerenone  (INSPRA ) 50 MG tablet; Take 1 tablet (50 mg total) by mouth daily.  Resistant hypertension (hyperaldosteronism) 136/76 on recheck today.  Continue current regimen of amlodipine  10, eplerenone  50 mg daily, hydralazine  100 mg 3 times daily. Plan Continue current regimen Orders:   eplerenone  (INSPRA ) 50 MG tablet; Take 1 tablet (50 mg total) by mouth daily.  Persistent atrial fibrillation ([name]) Dose was decreased from 5 mg twice daily to 2.5 mg while in the hospital due to age and kidney disease.  Patient able to verbalize this change and is adherent.  Refill today. Plan Apixaban  2.5 mg twice daily Orders:   apixaban  (ELIQUIS ) 2.5 MG TABS tablet; Take 1 tablet (2.5 mg total) by mouth 2 (two) times daily.   Return if symptoms worsen or fail to improve, has fu in May.    Viktoria King, DO    [1]  Social History Tobacco Use   Smoking status: Former    Current packs/day: 0.00    Average packs/day: 0.5 packs/day for 20.0 years (10.0 ttl pk-yrs)    Types: Cigarettes    Start date: 10/02/1952    Quit date: 10/02/1972    Years since quitting:  51.8   Smokeless tobacco: Never  Vaping Use   Vaping status: Never Used  Substance Use Topics   Alcohol use: No    Alcohol/week: 0.0 standard drinks of alcohol   Drug use: No   "

## 2024-08-12 NOTE — Assessment & Plan Note (Signed)
 Last saw cardiology in November and was recommended to follow-up in 1 year.  Currently on amlodipine  10, Lasix  40 and hydralazine  100 mg 3 times daily.  He was admitted for progressive dyspnea and orthopnea thought to be due to severe pulmonary hypertension with right ventricle enlargement, WHO group 3 due to OSA.  Today he is euvolemic and breathing comfortably on room air.  The only thing that bothers him is a persistent cough with phlegm.  He was recommended to take Coricidin on hospital discharge for his cough and says that it helped.  I recommended that he continue this over-the-counter medication and also try Mucinex  if it is still a problem. Plan Continue current regimen Orders:   eplerenone  (INSPRA ) 50 MG tablet; Take 1 tablet (50 mg total) by mouth daily.

## 2024-08-12 NOTE — Assessment & Plan Note (Addendum)
 Currently managed only with lifestyle modifications.  A1c 6.4 today, this is up from 5.7 3 months ago.  He is still within the prediabetic range.  Recommended to stop metformin  several months ago.  Will call patient of results and encourage diet and exercise.  Consider adding back metformin  if A1c continues to increase. Plan A1c in 3 months Continue lifestyle modifications Orders:   POC Hbg A1C   Glucose, capillary

## 2024-08-12 NOTE — Patient Instructions (Addendum)
 It was wonderful seeing you today!   I am glad to see you're doing well.   I refilled your Eliquis  and Inspira to your pharmacy.   Continue taking your medications as prescribed. We're not making any changes today.   For your cough, you can continue to use the Coricidin HBP and you can try Mucinex  for your sputum production.   If you have any questions please feel free to the call the clinic at anytime at [phone].  Have a blessed day,  Dr. Charmayne

## 2024-08-12 NOTE — Assessment & Plan Note (Signed)
 136/76 on recheck today.  Continue current regimen of amlodipine  10, eplerenone  50 mg daily, hydralazine  100 mg 3 times daily. Plan Continue current regimen Orders:   eplerenone  (INSPRA ) 50 MG tablet; Take 1 tablet (50 mg total) by mouth daily.

## 2024-08-12 NOTE — Progress Notes (Signed)
 Internal Medicine Clinic Attending  Case discussed with the resident at the time of the visit.  We reviewed the resident's history and exam and pertinent patient test results.  I agree with the assessment, diagnosis, and plan of care documented in the resident's note.

## 2024-08-12 NOTE — Assessment & Plan Note (Signed)
 Dose was decreased from 5 mg twice daily to 2.5 mg while in the hospital due to age and kidney disease.  Patient able to verbalize this change and is adherent.  Refill today. Plan Apixaban  2.5 mg twice daily Orders:   apixaban  (ELIQUIS ) 2.5 MG TABS tablet; Take 1 tablet (2.5 mg total) by mouth 2 (two) times daily.

## 2024-08-13 ENCOUNTER — Ambulatory Visit: Payer: Self-pay

## 2024-10-21 ENCOUNTER — Ambulatory Visit: Payer: Self-pay | Admitting: Internal Medicine

## 2024-11-24 ENCOUNTER — Ambulatory Visit
